# Patient Record
Sex: Female | Born: 1955 | Race: Black or African American | Hispanic: No | Marital: Single | Smoking: Former smoker
Health system: Southern US, Community
[De-identification: ages and names within clinical notes are randomized; demographics above are authoritative.]

## PROBLEM LIST (undated history)

## (undated) DIAGNOSIS — I10 Essential (primary) hypertension: Secondary | ICD-10-CM

## (undated) DIAGNOSIS — Z720 Tobacco use: Secondary | ICD-10-CM

## (undated) DIAGNOSIS — H409 Unspecified glaucoma: Secondary | ICD-10-CM

## (undated) DIAGNOSIS — I639 Cerebral infarction, unspecified: Secondary | ICD-10-CM

## (undated) DIAGNOSIS — R42 Dizziness and giddiness: Secondary | ICD-10-CM

## (undated) DIAGNOSIS — I671 Cerebral aneurysm, nonruptured: Secondary | ICD-10-CM

## (undated) DIAGNOSIS — R51 Headache: Secondary | ICD-10-CM

## (undated) DIAGNOSIS — R519 Headache, unspecified: Secondary | ICD-10-CM

## (undated) DIAGNOSIS — H269 Unspecified cataract: Secondary | ICD-10-CM

## (undated) DIAGNOSIS — I63239 Cerebral infarction due to unspecified occlusion or stenosis of unspecified carotid arteries: Secondary | ICD-10-CM

## (undated) DIAGNOSIS — E119 Type 2 diabetes mellitus without complications: Secondary | ICD-10-CM

## (undated) HISTORY — PX: CARDIAC DEFIBRILLATOR PLACEMENT: SHX171

## (undated) HISTORY — DX: Cerebral aneurysm, nonruptured: I67.1

## (undated) HISTORY — PX: OTHER SURGICAL HISTORY: SHX169

## (undated) HISTORY — PX: ABDOMINAL HYSTERECTOMY: SHX81

---

## 2017-02-17 ENCOUNTER — Emergency Department (HOSPITAL_COMMUNITY)
Admission: EM | Admit: 2017-02-17 | Discharge: 2017-02-18 | Disposition: A | Discharge status: Home / Self Care | Payer: Self-pay | Attending: Emergency Medicine | Admitting: Emergency Medicine

## 2017-02-17 ENCOUNTER — Encounter (HOSPITAL_COMMUNITY): Payer: Self-pay | Admitting: Emergency Medicine

## 2017-02-17 DIAGNOSIS — I10 Essential (primary) hypertension: Secondary | ICD-10-CM

## 2017-02-17 DIAGNOSIS — R51 Headache: Secondary | ICD-10-CM | POA: Insufficient documentation

## 2017-02-17 DIAGNOSIS — G44209 Tension-type headache, unspecified, not intractable: Secondary | ICD-10-CM

## 2017-02-17 DIAGNOSIS — K0889 Other specified disorders of teeth and supporting structures: Secondary | ICD-10-CM

## 2017-02-17 DIAGNOSIS — F17200 Nicotine dependence, unspecified, uncomplicated: Secondary | ICD-10-CM | POA: Insufficient documentation

## 2017-02-17 HISTORY — DX: Essential (primary) hypertension: I10

## 2017-02-17 HISTORY — DX: Dizziness and giddiness: R42

## 2017-02-17 MED ORDER — OXYCODONE-ACETAMINOPHEN 5-325 MG PO TABS
2.0000 | ORAL_TABLET | Freq: Once | ORAL | Status: AC
Start: 2017-02-17 — End: 2017-02-17
  Administered 2017-02-17: 2 via ORAL
  Filled 2017-02-17: qty 2

## 2017-02-17 MED ORDER — ONDANSETRON 4 MG PO TBDP
4.0000 mg | ORAL_TABLET | Freq: Once | ORAL | Status: DC
  Filled 2017-02-17: qty 1

## 2017-02-17 NOTE — ED Provider Notes (Signed)
[facility] DEPT Provider Note   CSN: [id number] Arrival date & time: 02/17/17  1631  By signing my name below, I, Dyke Brackett, attest that this documentation has been prepared under the direction and in the presence of non-physician practitioner, Margarita Mail, PA-C. Electronically Signed: Dyke Brackett, Scribe. 02/17/2017. 10:36 PM.   History   Chief Complaint Chief Complaint  Patient presents with  . Headache  . Dizziness   HPI Amy Howell is a 61 y.o. female with a hx of HTN and vertigo who presents to the Emergency Department complaining of gradual onset, constant right lateral occipital and right lateral occular headache onset today. She describes this as aching, stabbing, throbbing pain. She notes associated upper right and upper left dental pain. Pt states "Every time I get a headache, my teeth hurt". Per pt, this dental pain is exacerbated by eating or drinking. She previously had surgery on her gums to these areas due to an infection. She also reports intermittent vertigo. Pt has taken Imitrex with no relief of symptoms. Of notes, pt recently moved to [place] form [place] and has been rationing her blood pressure medication because she has not established care with a PCP. She denies any sinus pain, sinus pressure, cough, congestion, rhinorrhea, or any other associated symptoms.   The history is provided by the patient. No language interpreter was used.    Past Medical History:  Diagnosis Date  . Hypertension   . Vertigo     There are no active problems to display for this patient.   Past Surgical History:  Procedure Laterality Date  . gum operation      OB History    No data available       Home Medications    Prior to Admission medications   Medication Sig Start Date End Date Taking? Authorizing Provider  amLODipine (NORVASC) 10 MG tablet Take 10 mg by mouth daily.   Yes Historical Provider, MD  Cyanocobalamin (VITAMIN B 12 PO) Take 1 tablet by mouth  daily.   Yes Historical Provider, MD    Family History History reviewed. No pertinent family history.  Social History Social History  Substance Use Topics  . Smoking status: Current Every Day Smoker  . Smokeless tobacco: Never Used  . Alcohol use No     Allergies   Patient has no known allergies.   Review of Systems Review of Systems 10 systems reviewed and all are negative for acute change except as noted in the HPI.   Physical Exam Updated Vital Signs BP (!) 219/96 (BP Location: Left Arm)   Pulse 77   Temp 98.6 F (37 C) (Oral)   Resp 18   SpO2 100%   Physical Exam  Constitutional: She is oriented to person, place, and time. She appears well-developed and well-nourished. No distress.  HENT:  Head: Normocephalic and atraumatic.  Mouth/Throat: Oropharynx is clear and moist.  Eyes: Conjunctivae and EOM are normal. Pupils are equal, round, and reactive to light. No scleral icterus.  No horizontal, vertical or rotational nystagmus  Neck: Normal range of motion. Neck supple.  Full active and passive ROM without pain No midline or paraspinal tenderness No nuchal rigidity or meningeal signs  Cardiovascular: Normal rate, regular rhythm, normal heart sounds and intact distal pulses.  Exam reveals no gallop and no friction rub.   No murmur heard. Pulmonary/Chest: Effort normal and breath sounds normal. No respiratory distress. She has no wheezes. She has no rales.  Abdominal: Soft. Bowel sounds are normal.  She exhibits no distension. There is no tenderness. There is no rebound and no guarding.  Musculoskeletal: Normal range of motion.  Lymphadenopathy:    She has no cervical adenopathy.  Neurological: She is alert and oriented to person, place, and time. No cranial nerve deficit. She exhibits normal muscle tone. Coordination normal.  Mental Status:  Alert, oriented, thought content appropriate. Speech fluent without evidence of aphasia. Able to follow 2 step commands  without difficulty.  Cranial Nerves:  II:  Peripheral visual fields grossly normal, pupils equal, round, reactive to light III,IV, VI: ptosis not present, extra-ocular motions intact bilaterally  V,VII: smile symmetric, facial light touch sensation equal VIII: hearing grossly normal bilaterally  IX,X: midline uvula rise  XI: bilateral shoulder shrug equal and strong XII: midline tongue extension  Motor:  5/5 in upper and lower extremities bilaterally including strong and equal grip strength and dorsiflexion/plantar flexion Sensory: Pinprick and light touch normal in all extremities.  Cerebellar: normal finger-to-nose with bilateral upper extremities Gait: normal gait and balance CV: distal pulses palpable throughout   Skin: Skin is warm and dry. No rash noted. She is not diaphoretic.  Psychiatric: She has a normal mood and affect. Her behavior is normal. Judgment and thought content normal.  Nursing note and vitals reviewed.   ED Treatments / Results  DIAGNOSTIC STUDIES:  Oxygen Saturation is 100% on RA, normal by my interpretation.    COORDINATION OF CARE:  10:25 PM Discussed treatment plan with pt at bedside and pt agreed to plan.  Labs (all labs ordered are listed, but only abnormal results are displayed) Labs Reviewed - No data to display  EKG  EKG Interpretation None       Radiology No results found.  Procedures Procedures (including critical care time)  Medications Ordered in ED Medications - No data to display   Initial Impression / Assessment and Plan / ED Course  I have reviewed the triage vital signs and the nursing notes.  Pertinent labs & imaging results that were available during my care of the patient were reviewed by me and considered in my medical decision making (see chart for details).     Pt HA treated and improved while in ED.  Presentation is like pts typical HA and non concerning for [facility], ICH, Meningitis, or temporal arteritis. Pt is  afebrile with no focal neuro deficits, nuchal rigidity, or change in vision. Pt is to follow up with PCP to discuss prophylactic medication. Pt verbalizes understanding and is agreeable with plan to dc.    Final Clinical Impressions(s) / ED Diagnoses   Final diagnoses:  Acute non intractable tension-type headache  Pain, dental  Hypertension, unspecified type    New Prescriptions New Prescriptions   No medications on file  I personally performed the services described in this documentation, which was scribed in my presence. The recorded information has been reviewed and is accurate.      Margarita Mail, PA-C 02/18/17 0071    Lacretia Leigh, MD 02/18/17 [id number]

## 2017-02-17 NOTE — ED Triage Notes (Addendum)
Pt reports HA, ear pain, dental pain, nausea, and vertigo (room spinning sensation) since February. Pt has a hx of vertigo. Pt also rationing BP medication for the past month because she is between PCPs.

## 2017-02-17 NOTE — ED Notes (Signed)
Pt ambulatory to restroom

## 2017-02-18 MED ORDER — OXYCODONE-ACETAMINOPHEN 5-325 MG PO TABS: 1.0000 | ORAL_TABLET | Freq: Four times a day (QID) | ORAL | 0 refills | Status: DC | PRN

## 2017-02-18 MED ORDER — AMLODIPINE BESYLATE 10 MG PO TABS: 10.0000 mg | ORAL_TABLET | Freq: Every day | ORAL | 1 refills | Status: DC

## 2017-02-18 MED ORDER — MECLIZINE HCL 25 MG PO TABS: 12.5000 mg | ORAL_TABLET | Freq: Three times a day (TID) | ORAL | 0 refills | Status: DC | PRN

## 2017-02-18 NOTE — Discharge Instructions (Signed)
You are having a headache. No specific cause was found today for your headache. It may have been a migraine or other cause of headache. Stress, anxiety, fatigue, and depression are common triggers for headaches. Your headache today does not appear to be life-threatening or require hospitalization, but often the exact cause of headaches is not determined in the emergency department. Therefore, follow-up with your doctor is very important to find out what may have caused your headache, and whether or not you need any further diagnostic testing or treatment. Sometimes headaches can appear benign (not harmful), but then more serious symptoms can develop which should prompt an immediate re-evaluation by your doctor or the emergency department. SEEK MEDICAL ATTENTION IF: You develop possible problems with medications prescribed.  The medications don't resolve your headache, if it recurs , or if you have multiple episodes of vomiting or can't take fluids. You have a change from the usual headache. RETURN IMMEDIATELY IF you develop a sudden, severe headache or confusion, become poorly responsive or faint, develop a fever above 100.32F or problem breathing, have a change in speech, vision, swallowing, or understanding, or develop new weakness, numbness, tingling, incoordination, or have a seizure.  [facility]  [address]  [place], [place] [address]  Phone [phone]  [facility] [place] [facility] [place], [place], exemplifies the [employer]?s vision to improve the health and quality of life of all [place] by Regulatory affairs officer with a passion to care for the underserved and by leading the nation in community-based, service learning oral health education. We are committed to offering comprehensive general dental services for adults,  children and special needs patients in a safe, caring and professional setting.  Appointments: Our clinic is open Monday through Friday 8:00 a.m. until 5:00 p.m. The amount of time scheduled for an appointment depends on the patient?s specific needs. We ask that you keep your appointed time for care or provide 24-hour notice of all appointment changes. Parents or legal guardians must accompany minor children.  Payment for Services: Medicaid and other insurance plans are welcome. Payment for services is due when services are rendered and may be made by cash or credit card. If you have dental insurance, we will assist you with your claim submission.   Emergencies: Emergency services will be provided Monday through Friday on a walk-in basis. Please arrive early for emergency services. After hours emergency services will be provided for patients of record as required.  Services:  Comprehensive General Dentistry  Children?s Dentistry  Oral Surgery - Extractions  Root Canals  Sealants and Tooth Colored Fillings  Crowns and Administrator  3-D/[facility]

## 2017-03-18 ENCOUNTER — Ambulatory Visit: Payer: Self-pay | Attending: Internal Medicine | Admitting: Physician Assistant

## 2017-03-18 VITALS — BP 134/84 | HR 84 | Temp 98.8°F | Resp 16 | Ht 60.0 in | Wt 98.0 lb

## 2017-03-18 DIAGNOSIS — I10 Essential (primary) hypertension: Secondary | ICD-10-CM

## 2017-03-18 DIAGNOSIS — H539 Unspecified visual disturbance: Secondary | ICD-10-CM

## 2017-03-18 DIAGNOSIS — M79602 Pain in left arm: Secondary | ICD-10-CM

## 2017-03-18 MED ORDER — AMLODIPINE BESYLATE 10 MG PO TABS: 10.0000 mg | ORAL_TABLET | Freq: Every day | ORAL | 1 refills | Status: DC

## 2017-03-18 MED ORDER — MELOXICAM 7.5 MG PO TABS: ORAL_TABLET | ORAL | 0 refills | Status: DC

## 2017-03-18 NOTE — Progress Notes (Signed)
Patient ID: Amy Howell, female   DOB: May 05, 1956, 61 y.o.   MRN: [id number]       Amy Howell, is a 61 y.o. female  PFX:[id number]  HGD:[id number]  DOB - 29-Apr-1956  Subjective:  Chief Complaint and HPI: Amy Howell is a 61 y.o. female here today to establish care and for a follow up visit After being seen in the ED for headache, tooth pain, and mild vertigo 02/17/2017.  She has a h/o htn and had been rationing her BP meds since moving here from [place].  No imaging or labs were done.  Zofran and Roxicet releived the headache in the ED and she was discharged with Meclizine, oxycodone, and amlodipine.   Today she presents only taking 1/2 tablets of the amlodipine because she feels it is causing her to have headaches.  The headaches are on the L side of her head and are the same as what she was having when she presented to the ED.  She c/o similar headaches since childhood that also caused tooth pain.    Her daughter moved her here from [place] in February 2018 after she retired in December.  She was in the process of applying for disability there.  Today she is concerned because of an increase in blurry vision over the last several months.  She thinks her cataracts may have progressed.  She is not driving.  She wants to move back to [place].  She also c/o L arm pain that is intermittent and worse with reaching and lifting objects.  No paresthesias, weakness, or CP.  ED/Hospital notes reviewed.    Social:  Living in [place] with daughter; recently relocated from [place].  Doesn't want to live here.  FH:  Dad w/ htn, DM, blind in one eye and cataracts  ROS:   Constitutional:  No f/c, No night sweats, No unexplained weight loss. EENT:  Vision changes as above, No hearing changes. No mouth, throat, or ear problems.  Respiratory: No cough, No SOB Cardiac: No CP, no palpitations GI:  No abd pain, No N/V/D. GU: No Urinary s/sx Musculoskeletal: L upper mid arm pain Neuro: + headaches as above, no dizziness, no  motor weakness.  Skin: No rash Endocrine:  No polydipsia. No polyuria.  Psych: Denies SI/HI  No problems updated.  ALLERGIES: No Known Allergies  PAST MEDICAL HISTORY: Past Medical History:  Diagnosis Date  . Hypertension   . Vertigo     MEDICATIONS AT HOME: Prior to Admission medications   Medication Sig Start Date End Date Taking? Authorizing Provider  amLODipine (NORVASC) 10 MG tablet Take 1 tablet (10 mg total) by mouth daily. 03/18/17  Yes Argentina Donovan, PA-C  oxyCODONE-acetaminophen (PERCOCET) 5-325 MG tablet Take 1 tablet by mouth every 6 (six) hours as needed for severe pain. 02/18/17  Yes Margarita Mail, PA-C  trimethoprim-polymyxin b (POLYTRIM) ophthalmic solution every 4 (four) hours.   Yes Historical Provider, MD  Cyanocobalamin (VITAMIN B 12 PO) Take 1 tablet by mouth daily.    Historical Provider, MD  meclizine (ANTIVERT) 25 MG tablet Take 0.5-1 tablets (12.5-25 mg total) by mouth 3 (three) times daily as needed for dizziness or nausea. Patient not taking: Reported on 03/18/2017 02/18/17   Margarita Mail, PA-C  meloxicam (MOBIC) 7.5 MG tablet 1 bid prn pain 03/18/17   Argentina Donovan, PA-C     Objective:  EXAM:   Vitals:   03/18/17 0933  BP: 134/84  Pulse: 84  Resp: 16  Temp: 98.8 F (37.1  C)  TempSrc: Oral  SpO2: 100%  Weight: 98 lb (44.5 kg)  Height: 5' (1.524 m)    General appearance : A&OX3. NAD. Non-toxic-appearing HEENT: Atraumatic and Normocephalic.  PERRLA. EOM intact.  TM clear B. Mouth-MMM, post pharynx WNL w/o erythema, No PND. Neck: supple, no JVD. No cervical lymphadenopathy. No thyromegaly Chest/Lungs:  Breathing-non-labored, Good air entry bilaterally, breath sounds normal without rales, rhonchi, or wheezing  CVS: S1 S2 regular, no murmurs, gallops, rubs  Extremities: Bilateral Lower Ext shows no edema, both legs are warm to touch with = pulse throughout.  R and L UE examined without any abnormality or limitation. Neurology:  CN II-XII  grossly intact, Non focal.  Normal facial and extremity symmetry Psych:  TP circumferential. Tangential speech. Appropriate eye contact and affect.  Skin:  No Rash  Data Review No results found for: HGBA1C   Assessment & Plan   1. Hypertension, unspecified type Improving control.  She thinks she only needs 5mg  amlodipine and that 10mg  is causing her headaches.  I am ok with her trying only 5mg  as long as she checks her BP outside of the office and records it and she resumes 10mg  if she is consistently getting readings higher than 140/90. - Comprehensive metabolic panel - TSH - CBC with Differential/Platelet - amLODipine (NORVASC) 10 MG tablet; Take 1 tablet (10 mg total) by mouth daily.  Dispense: 30 tablet; Refill: 1 Check BP OOO 3-4 times/week and record and bring to next visit. We have discussed target BP range and blood pressure goal. I have advised patient to check BP regularly and to call us back or report to clinic if the numbers are consistently higher than 140/90. We discussed the importance of compliance with medical therapy and DASH diet recommended, consequences of uncontrolled hypertension discussed.     2. Vision changes - Ambulatory referral to Ophthalmology  3. Left arm pain Believe musculoskeletal. - meloxicam (MOBIC) 7.5 MG tablet; 1 bid prn pain  Dispense: 60 tablet; Refill: 0  Having patient sign ROI.  She is somewhat difficult to obtain direct history from so old records are imperative to have these to provide good care for her.     Patient have been counseled extensively about nutrition and exercise  Return in about 3 weeks (around 04/08/2017) for assign PCP; f/up htn, arm pain and vision changes.  The patient was given clear instructions to go to ER or return to medical center if symptoms don't improve, worsen or new problems develop. The patient verbalized understanding. The patient was told to call to get lab results if they haven't heard anything in the next  week.     Freeman Caldron, PA-C [facility] [place], [place]   03/18/2017, 10:04 AM

## 2017-03-18 NOTE — Patient Instructions (Signed)
Check blood pressure 3-4 times/week and record and bring to next visit.  Hypertension Hypertension, commonly called high blood pressure, is when the force of blood pumping through the arteries is too strong. The arteries are the blood vessels that carry blood from the heart throughout the body. Hypertension forces the heart to work harder to pump blood and may cause arteries to become narrow or stiff. Having untreated or uncontrolled hypertension can cause heart attacks, strokes, kidney disease, and other problems. A blood pressure reading consists of a higher number over a lower number. Ideally, your blood pressure should be below 120/80. The first ("top") number is called the systolic pressure. It is a measure of the pressure in your arteries as your heart beats. The second ("bottom") number is called the diastolic pressure. It is a measure of the pressure in your arteries as the heart relaxes. What are the causes? The cause of this condition is not known. What increases the risk? Some risk factors for high blood pressure are under your control. Others are not. Factors you can change   Smoking.  Having type 2 diabetes mellitus, high cholesterol, or both.  Not getting enough exercise or physical activity.  Being overweight.  Having too much fat, sugar, calories, or salt (sodium) in your diet.  Drinking too much alcohol. Factors that are difficult or impossible to change   Having chronic kidney disease.  Having a family history of high blood pressure.  Age. Risk increases with age.  Race. You may be at higher risk if you are African-American.  Gender. Men are at higher risk than women before age 25. After age 71, women are at higher risk than men.  Having obstructive sleep apnea.  Stress. What are the signs or symptoms? Extremely high blood pressure (hypertensive crisis) may cause:  Headache.  Anxiety.  Shortness of breath.  Nosebleed.  Nausea and vomiting.  Severe  chest pain.  Jerky movements you cannot control (seizures). How is this diagnosed? This condition is diagnosed by measuring your blood pressure while you are seated, with your arm resting on a surface. The cuff of the blood pressure monitor will be placed directly against the skin of your upper arm at the level of your heart. It should be measured at least twice using the same arm. Certain conditions can cause a difference in blood pressure between your right and left arms. Certain factors can cause blood pressure readings to be lower or higher than normal (elevated) for a short period of time:  When your blood pressure is higher when you are in a health care provider's office than when you are at home, this is called white coat hypertension. Most people with this condition do not need medicines.  When your blood pressure is higher at home than when you are in a health care provider's office, this is called masked hypertension. Most people with this condition may need medicines to control blood pressure. If you have a high blood pressure reading during one visit or you have normal blood pressure with other risk factors:  You may be asked to return on a different day to have your blood pressure checked again.  You may be asked to monitor your blood pressure at home for 1 week or longer. If you are diagnosed with hypertension, you may have other blood or imaging tests to help your health care provider understand your overall risk for other conditions. How is this treated? This condition is treated by making healthy lifestyle changes, such  as eating healthy foods, exercising more, and reducing your alcohol intake. Your health care provider may prescribe medicine if lifestyle changes are not enough to get your blood pressure under control, and if:  Your systolic blood pressure is above 130.  Your diastolic blood pressure is above 80. Your personal target blood pressure may vary depending on your  medical conditions, your age, and other factors. Follow these instructions at home: Eating and drinking   Eat a diet that is high in fiber and potassium, and low in sodium, added sugar, and fat. An example eating plan is called the DASH (Dietary Approaches to Stop Hypertension) diet. To eat this way:  Eat plenty of fresh fruits and vegetables. Try to fill half of your plate at each meal with fruits and vegetables.  Eat whole grains, such as whole wheat pasta, brown rice, or whole grain bread. Fill about one quarter of your plate with whole grains.  Eat or drink low-fat dairy products, such as skim milk or low-fat yogurt.  Avoid fatty cuts of meat, processed or cured meats, and poultry with skin. Fill about one quarter of your plate with lean proteins, such as fish, chicken without skin, beans, eggs, and tofu.  Avoid premade and processed foods. These tend to be higher in sodium, added sugar, and fat.  Reduce your daily sodium intake. Most people with hypertension should eat less than 1,500 mg of sodium a day.  Limit alcohol intake to no more than 1 drink a day for nonpregnant women and 2 drinks a day for men. One drink equals 12 oz of beer, 5 oz of wine, or 1 oz of hard liquor. Lifestyle   Work with your health care provider to maintain a healthy body weight or to lose weight. Ask what an ideal weight is for you.  Get at least 30 minutes of exercise that causes your heart to beat faster (aerobic exercise) most days of the week. Activities may include walking, swimming, or biking.  Include exercise to strengthen your muscles (resistance exercise), such as pilates or lifting weights, as part of your weekly exercise routine. Try to do these types of exercises for 30 minutes at least 3 days a week.  Do not use any products that contain nicotine or tobacco, such as cigarettes and e-cigarettes. If you need help quitting, ask your health care provider.  Monitor your blood pressure at home as  told by your health care provider.  Keep all follow-up visits as told by your health care provider. This is important. Medicines   Take over-the-counter and prescription medicines only as told by your health care provider. Follow directions carefully. Blood pressure medicines must be taken as prescribed.  Do not skip doses of blood pressure medicine. Doing this puts you at risk for problems and can make the medicine less effective.  Ask your health care provider about side effects or reactions to medicines that you should watch for. Contact a health care provider if:  You think you are having a reaction to a medicine you are taking.  You have headaches that keep coming back (recurring).  You feel dizzy.  You have swelling in your ankles.  You have trouble with your vision. Get help right away if:  You develop a severe headache or confusion.  You have unusual weakness or numbness.  You feel faint.  You have severe pain in your chest or abdomen.  You vomit repeatedly.  You have trouble breathing. Summary  Hypertension is when the force  of blood pumping through your arteries is too strong. If this condition is not controlled, it may put you at risk for serious complications.  Your personal target blood pressure may vary depending on your medical conditions, your age, and other factors. For most people, a normal blood pressure is less than 120/80.  Hypertension is treated with lifestyle changes, medicines, or a combination of both. Lifestyle changes include weight loss, eating a healthy, low-sodium diet, exercising more, and limiting alcohol. This information is not intended to replace advice given to you by your health care provider. Make sure you discuss any questions you have with your health care provider. Document Released: 11/18/2005 Document Revised: 10/16/2016 Document Reviewed: 10/16/2016 Elsevier Interactive Patient Education  2017 [employer].

## 2017-03-19 LAB — COMPREHENSIVE METABOLIC PANEL
A/G RATIO: 1.3 (ref 1.2–2.2)
ALK PHOS: 96 IU/L (ref 39–117)
ALT: 12 IU/L (ref 0–32)
AST: 15 IU/L (ref 0–40)
Albumin: 4.8 g/dL (ref 3.6–4.8)
BILIRUBIN TOTAL: 0.3 mg/dL (ref 0.0–1.2)
BUN / CREAT RATIO: 18 (ref 12–28)
BUN: 16 mg/dL (ref 8–27)
CHLORIDE: 99 mmol/L (ref 96–106)
CO2: 26 mmol/L (ref 18–29)
Calcium: 10 mg/dL (ref 8.7–10.3)
Creatinine, Ser: 0.87 mg/dL (ref 0.57–1.00)
GFR calc non Af Amer: 71 mL/min/{1.73_m2} (ref >59)
GFR, EST AFRICAN AMERICAN: 82 mL/min/{1.73_m2} (ref >59)
Globulin, Total: 3.6 g/dL (ref 1.5–4.5)
Glucose: 87 mg/dL (ref 65–99)
Potassium: 4.4 mmol/L (ref 3.5–5.2)
Sodium: 141 mmol/L (ref 134–144)
TOTAL PROTEIN: 8.4 g/dL (ref 6.0–8.5)

## 2017-03-19 LAB — CBC WITH DIFFERENTIAL/PLATELET
BASOS: 1 %
Basophils Absolute: 0 10*3/uL (ref 0.0–0.2)
EOS (ABSOLUTE): 0.2 10*3/uL (ref 0.0–0.4)
Eos: 4 %
Hematocrit: 39.2 % (ref 34.0–46.6)
Hemoglobin: 12.8 g/dL (ref 11.1–15.9)
Immature Grans (Abs): 0 10*3/uL (ref 0.0–0.1)
Immature Granulocytes: 0 %
LYMPHS ABS: 1.3 10*3/uL (ref 0.7–3.1)
Lymphs: 22 %
MCH: 29.3 pg (ref 26.6–33.0)
MCHC: 32.7 g/dL (ref 31.5–35.7)
MCV: 90 fL (ref 79–97)
Monocytes Absolute: 0.5 10*3/uL (ref 0.1–0.9)
Monocytes: 7 %
NEUTROS ABS: 4.1 10*3/uL (ref 1.4–7.0)
Neutrophils: 66 %
Platelets: 176 10*3/uL (ref 150–379)
RBC: 4.37 x10E6/uL (ref 3.77–5.28)
RDW: 13.8 % (ref 12.3–15.4)
WBC: 6.2 10*3/uL (ref 3.4–10.8)

## 2017-03-19 LAB — TSH: TSH: 0.837 u[IU]/mL (ref 0.450–4.500)

## 2017-03-21 ENCOUNTER — Telehealth: Payer: Self-pay

## 2017-03-21 NOTE — Telephone Encounter (Signed)
Contacted pt to go over lab results pt didn't answer lvm asking pt to give me a call at her earliest convieneince    If pt calls back please give results: let her know that her labs are normal. This includes her blood count, thyroid function, blood sugar(no diabetes), kidney function, liver function, and electrolytes. Follow-up as planned.

## 2017-03-25 ENCOUNTER — Telehealth: Payer: Self-pay | Admitting: General Practice

## 2017-03-25 NOTE — Telephone Encounter (Signed)
Pt. Returned nurse call and per the nurse pt. Was told that her results were normal. Pt. Had further questions and requested to speak with the nurse. Please f/u with pt.

## 2017-03-26 ENCOUNTER — Inpatient Hospital Stay (HOSPITAL_COMMUNITY)
Admission: EM | Admit: 2017-03-26 | Discharge: 2017-03-29 | DRG: 040 | Disposition: A | Discharge status: Home / Self Care | Payer: Self-pay | Attending: Nephrology | Admitting: Nephrology

## 2017-03-26 ENCOUNTER — Encounter (HOSPITAL_COMMUNITY): Payer: Self-pay

## 2017-03-26 ENCOUNTER — Inpatient Hospital Stay (HOSPITAL_COMMUNITY): Payer: Self-pay

## 2017-03-26 ENCOUNTER — Emergency Department (HOSPITAL_COMMUNITY): Payer: Self-pay

## 2017-03-26 DIAGNOSIS — I639 Cerebral infarction, unspecified: Secondary | ICD-10-CM | POA: Diagnosis present

## 2017-03-26 DIAGNOSIS — I63439 Cerebral infarction due to embolism of unspecified posterior cerebral artery: Principal | ICD-10-CM | POA: Diagnosis present

## 2017-03-26 DIAGNOSIS — Z681 Body mass index (BMI) 19 or less, adult: Secondary | ICD-10-CM

## 2017-03-26 DIAGNOSIS — H5347 Heteronymous bilateral field defects: Secondary | ICD-10-CM | POA: Diagnosis present

## 2017-03-26 DIAGNOSIS — Z8673 Personal history of transient ischemic attack (TIA), and cerebral infarction without residual deficits: Secondary | ICD-10-CM

## 2017-03-26 DIAGNOSIS — R51 Headache: Secondary | ICD-10-CM | POA: Diagnosis present

## 2017-03-26 DIAGNOSIS — E785 Hyperlipidemia, unspecified: Secondary | ICD-10-CM | POA: Diagnosis present

## 2017-03-26 DIAGNOSIS — E43 Unspecified severe protein-calorie malnutrition: Secondary | ICD-10-CM | POA: Diagnosis present

## 2017-03-26 DIAGNOSIS — R634 Abnormal weight loss: Secondary | ICD-10-CM

## 2017-03-26 DIAGNOSIS — Z90710 Acquired absence of both cervix and uterus: Secondary | ICD-10-CM

## 2017-03-26 DIAGNOSIS — F17200 Nicotine dependence, unspecified, uncomplicated: Secondary | ICD-10-CM

## 2017-03-26 DIAGNOSIS — Z79899 Other long term (current) drug therapy: Secondary | ICD-10-CM

## 2017-03-26 DIAGNOSIS — I671 Cerebral aneurysm, nonruptured: Secondary | ICD-10-CM

## 2017-03-26 DIAGNOSIS — I10 Essential (primary) hypertension: Secondary | ICD-10-CM | POA: Diagnosis present

## 2017-03-26 DIAGNOSIS — I651 Occlusion and stenosis of basilar artery: Secondary | ICD-10-CM

## 2017-03-26 LAB — CBC WITH DIFFERENTIAL/PLATELET
BASOS ABS: 0 10*3/uL (ref 0.0–0.1)
Basophils Relative: 0 %
EOS PCT: 5 %
Eosinophils Absolute: 0.3 10*3/uL (ref 0.0–0.7)
HCT: 39 % (ref 36.0–46.0)
Hemoglobin: 12.6 g/dL (ref 12.0–15.0)
Lymphocytes Relative: 27 %
Lymphs Abs: 1.7 10*3/uL (ref 0.7–4.0)
MCH: 30.3 pg (ref 26.0–34.0)
MCHC: 32.3 g/dL (ref 30.0–36.0)
MCV: 93.8 fL (ref 78.0–100.0)
MONO ABS: 0.3 10*3/uL (ref 0.1–1.0)
Monocytes Relative: 5 %
Neutro Abs: 4.2 10*3/uL (ref 1.7–7.7)
Neutrophils Relative %: 63 %
PLATELETS: 161 10*3/uL (ref 150–400)
RBC: 4.16 MIL/uL (ref 3.87–5.11)
RDW: 13.1 % (ref 11.5–15.5)
WBC: 6.6 10*3/uL (ref 4.0–10.5)

## 2017-03-26 LAB — APTT: APTT: 29 s (ref 24–36)

## 2017-03-26 LAB — COMPREHENSIVE METABOLIC PANEL
ALT: 10 U/L — ABNORMAL LOW (ref 14–54)
ANION GAP: 8 (ref 5–15)
AST: 20 U/L (ref 15–41)
Albumin: 4.1 g/dL (ref 3.5–5.0)
Alkaline Phosphatase: 81 U/L (ref 38–126)
BUN: 15 mg/dL (ref 6–20)
CHLORIDE: 101 mmol/L (ref 101–111)
CO2: 29 mmol/L (ref 22–32)
Calcium: 9.9 mg/dL (ref 8.9–10.3)
Creatinine, Ser: 0.91 mg/dL (ref 0.44–1.00)
Glucose, Bld: 176 mg/dL — ABNORMAL HIGH (ref 65–99)
POTASSIUM: 3.6 mmol/L (ref 3.5–5.1)
Sodium: 138 mmol/L (ref 135–145)
Total Bilirubin: 0.4 mg/dL (ref 0.3–1.2)
Total Protein: 7.8 g/dL (ref 6.5–8.1)

## 2017-03-26 LAB — PROTIME-INR
INR: 1.05
PROTHROMBIN TIME: 13.7 s (ref 11.4–15.2)

## 2017-03-26 MED ORDER — ASPIRIN 300 MG RE SUPP
300.0000 mg | Freq: Every day | RECTAL | Status: DC
  Administered 2017-03-28: 300 mg via RECTAL
  Filled 2017-03-26: qty 1

## 2017-03-26 MED ORDER — STROKE: EARLY STAGES OF RECOVERY BOOK
Freq: Once | Status: AC
  Administered 2017-03-27

## 2017-03-26 MED ORDER — ACETAMINOPHEN 160 MG/5ML PO SOLN: 650.0000 mg | ORAL | Status: DC | PRN

## 2017-03-26 MED ORDER — ACETAMINOPHEN 650 MG RE SUPP: 650.0000 mg | RECTAL | Status: DC | PRN

## 2017-03-26 MED ORDER — ASPIRIN 325 MG PO TABS
325.0000 mg | ORAL_TABLET | Freq: Every day | ORAL | Status: DC
Start: 2017-03-27 — End: 2017-03-29
  Administered 2017-03-27 – 2017-03-29 (×2): 325 mg via ORAL
  Filled 2017-03-26 (×2): qty 1

## 2017-03-26 MED ORDER — ACETAMINOPHEN 325 MG PO TABS
650.0000 mg | ORAL_TABLET | ORAL | Status: DC | PRN
  Administered 2017-03-27 – 2017-03-29 (×3): 650 mg via ORAL
  Filled 2017-03-26 (×3): qty 2

## 2017-03-26 MED ORDER — ENOXAPARIN SODIUM 30 MG/0.3ML ~~LOC~~ SOLN
30.0000 mg | SUBCUTANEOUS | Status: DC
Start: 2017-03-27 — End: 2017-03-29
  Administered 2017-03-27 – 2017-03-28 (×2): 30 mg via SUBCUTANEOUS
  Filled 2017-03-26 (×2): qty 0.3

## 2017-03-26 MED ORDER — AMLODIPINE BESYLATE 5 MG PO TABS: 5.0000 mg | ORAL_TABLET | Freq: Every day | ORAL | Status: DC

## 2017-03-26 MED ORDER — IOPAMIDOL (ISOVUE-370) INJECTION 76%
INTRAVENOUS | Status: AC
  Administered 2017-03-26: 50 mL
  Filled 2017-03-26: qty 50

## 2017-03-26 NOTE — ED Triage Notes (Signed)
PT sent from DR. Groat's office after eye exam to receive head ct. HE was concerned for possible head bleed d/t elevated pressure in left eye. Pt has been having severe eye pain in right eye and face and occasionally left eye since feb. She reports "episodes" of what she though was vertigo since feb. As well.

## 2017-03-26 NOTE — Consult Note (Addendum)
Referring Physician: Dr. Loleta Books    Chief Complaint: Left monocular vision loss with eye pain.   HPI: Amy Howell is an 61 y.o. female who was sent to [facility] ED from her ophthalmologist's office after exam there revealed a left monocular altitudinal visual field defect, which is evident on the visual field maps brought in from the ophthalmologist's office by the patient. Her ophthalmologist was concerned for possible intracerebral hemorrhage as well, given elevated pressure in her left eye. She states that her symptoms of left eye vision loss have been intermittent since February. She also has had vertigo, severe right eye pain, right face pain and left eye pain since February.    Past Medical History:  Diagnosis Date  . Hypertension   . Vertigo     Past Surgical History:  Procedure Laterality Date  . ABDOMINAL HYSTERECTOMY    . gum operation      Family History  Problem Relation Age of Onset  . Hypertension Father   . Diabetes Father    Social History:  reports that she has been smoking.  She has never used smokeless tobacco. She reports that she does not drink alcohol. Her drug history is not on file.  Allergies: No Known Allergies  Medications:  Prior to Admission:  Prescriptions Prior to Admission  Medication Sig Dispense Refill Last Dose  . amLODipine (NORVASC) 10 MG tablet Take 1 tablet (10 mg total) by mouth daily. 30 tablet 1 03/26/2017 at Unknown time  . meclizine (ANTIVERT) 25 MG tablet Take 0.5-1 tablets (12.5-25 mg total) by mouth 3 (three) times daily as needed for dizziness or nausea. 30 tablet 0 unk  . meloxicam (MOBIC) 7.5 MG tablet 1 bid prn pain 60 tablet 0 Past Week at Unknown time  . oxyCODONE-acetaminophen (PERCOCET) 5-325 MG tablet Take 1 tablet by mouth every 6 (six) hours as needed for severe pain. 10 tablet 0 Past Month at Unknown time   Scheduled: . amLODipine  5 mg Oral Daily  . aspirin  300 mg Rectal Daily   Or  . aspirin  325 mg Oral Daily  .  enoxaparin (LOVENOX) injection  30 mg Subcutaneous Q24H    ROS: Positive for bifrontal headache and posterior neck pain that is nonthrobbing and 8/10. Positive for LUE pain. Negative for CP, SOB, abdominal pain or leg pain. Denies weakness except for LUE in association with the pain. Denies confusion, ataxia or aphasia.   Physical Examination: Blood pressure (!) 201/85, pulse 69, temperature 98.3 F (36.8 C), temperature source Oral, resp. rate 16, SpO2 100 %.   HEENT: [place]/AT Lungs: Respirations unlabored Ext: No edema.   Neurologic Examination: Mental Status: Alert, oriented, thought content appropriate.  Dysthymic affect. Speech fluent without evidence of aphasia.  Able to follow all commands without difficulty. Cranial Nerves: II:  Peripheral visual fields are intact. Visual acuity is reduced bilaterally left worse than right; able to count fingers and attend to examiner visually, but unable to read a badge with either eye. PERRL. Funduscopic exam reveals no papilledema in either eye. III,IV, VI: ptosis not present, EOMI without nystagmus V,VII: smile symmetric, facial temp sensation normal bilaterally VIII: hearing intact to voice IX,X: no hypophonia or hoarseness XI: bilateral shoulder shrug is symmetric XII: midline tongue extension  Motor: Right : Upper extremity   55  Left:     Upper extremity 4+/5 in context of pain   Lower extremity   5/5   Lower extremity   5/5 Normal tone throughout; no atrophy noted  Sensory: Temp and light touch intact throughout, bilaterally Deep Tendon Reflexes:  2+ bilateral upper and lower extremities.  Toes downgoing bilaterally.  Cerebellar: No ataxia with FNF bilaterally.  Gait: Normal gait and station.  Results for orders placed or performed during the hospital encounter of 03/26/17 (from the past 48 hour(s))  CBC with Differential     Status: None   Collection Time: 03/26/17  6:56 PM  Result Value Ref Range   WBC 6.6 4.0 - 10.5 K/uL   RBC  4.16 3.87 - 5.11 MIL/uL   Hemoglobin 12.6 12.0 - 15.0 g/dL   HCT 39.0 36.0 - 46.0 %   MCV 93.8 78.0 - 100.0 fL   MCH 30.3 26.0 - 34.0 pg   MCHC 32.3 30.0 - 36.0 g/dL   RDW 13.1 11.5 - 15.5 %   Platelets 161 150 - 400 K/uL   Neutrophils Relative % 63 %   Neutro Abs 4.2 1.7 - 7.7 K/uL   Lymphocytes Relative 27 %   Lymphs Abs 1.7 0.7 - 4.0 K/uL   Monocytes Relative 5 %   Monocytes Absolute 0.3 0.1 - 1.0 K/uL   Eosinophils Relative 5 %   Eosinophils Absolute 0.3 0.0 - 0.7 K/uL   Basophils Relative 0 %   Basophils Absolute 0.0 0.0 - 0.1 K/uL  Comprehensive metabolic panel     Status: Abnormal   Collection Time: 03/26/17  6:56 PM  Result Value Ref Range   Sodium 138 135 - 145 mmol/L   Potassium 3.6 3.5 - 5.1 mmol/L   Chloride 101 101 - 111 mmol/L   CO2 29 22 - 32 mmol/L   Glucose, Bld 176 (H) 65 - 99 mg/dL   BUN 15 6 - 20 mg/dL   Creatinine, Ser 0.91 0.44 - 1.00 mg/dL   Calcium 9.9 8.9 - 10.3 mg/dL   Total Protein 7.8 6.5 - 8.1 g/dL   Albumin 4.1 3.5 - 5.0 g/dL   AST 20 15 - 41 U/L   ALT 10 (L) 14 - 54 U/L   Alkaline Phosphatase 81 38 - 126 U/L   Total Bilirubin 0.4 0.3 - 1.2 mg/dL   GFR calc non Af Amer >60 >60 mL/min   GFR calc Af Amer >60 >60 mL/min    Comment: (NOTE) The eGFR has been calculated using the CKD EPI equation. This calculation has not been validated in all clinical situations. eGFR's persistently <60 mL/min signify possible Chronic Kidney Disease.    Anion gap 8 5 - 15  Protime-INR     Status: None   Collection Time: 03/26/17  6:56 PM  Result Value Ref Range   Prothrombin Time 13.7 11.4 - 15.2 seconds   INR 1.05   APTT     Status: None   Collection Time: 03/26/17  6:56 PM  Result Value Ref Range   aPTT 29 24 - 36 seconds   Ct Head Wo Contrast  Result Date: 03/26/2017 CLINICAL DATA:  Intermittent headaches and vertigo since February. History of hypertension. EXAM: CT HEAD WITHOUT CONTRAST TECHNIQUE: Contiguous axial images were obtained from the base  of the skull through the vertex without intravenous contrast. COMPARISON:  None. FINDINGS: BRAIN: No intraparenchymal hemorrhage, mass effect nor midline shift. The ventricles and sulci are normal for age. Mesial RIGHT occipital lobe early encephalomalacia. Old bilateral cerebellar infarcts. Patchy supratentorial white matter hypodensities within normal range for patient's age, though non-specific are most compatible with chronic small vessel ischemic disease. No acute large vascular territory infarcts. No abnormal  extra-axial fluid collections. Basal cisterns are patent. VASCULAR: Moderate calcific atherosclerosis of the carotid siphons and to lesser extent intradural vertebral artery's. SKULL: No skull fracture. No significant scalp soft tissue swelling. SINUSES/ORBITS: The mastoid air-cells and included paranasal sinuses are well-aerated.The included ocular globes and orbital contents are non-suspicious. OTHER: None. IMPRESSION: Subacute to chronic RIGHT occipital lobe/ PCA territory infarct. Old bilateral cerebellar infarcts. Recommend MRA versus CTA to evaluate for posterior circulation abnormality. Mild chronic small vessel ischemic disease. Electronically Signed   By: Elon Alas M.D.   On: 03/26/2017 19:25    Assessment: 61 y.o. female with subacute right occipital lobe ischemic infarction.  1. Initially presented with left monocular altitudinal visual field defect in the setting of a 2 month history of intermittent bilateral eye and right face pain  2. Elevated left monocular pressure per report at her ophthalmologist appointment prior to admission. ? Glaucoma ? 3. CT head also reveals old bilateral cerebellar infarcts also noted.  4. Severe HTN.  5. Stroke Risk Factors - HTN  Plan: 1. Agree with starting ASA 2. MRI of the brain with contrast.  3. CTA of head and neck.  4. Echocardiogram 5. PT consult, OT consult, Speech consult 6. Start atorvastatin 40 mg po qd. Obtain baseline CK  level.  7. BP management. Based upon time course of her symptoms and appearance of the stroke on CT, she is most likely out of the permissive HTN time window 8. Telemetry monitoring 9. Frequent neuro checks 10. HgbA1c, fasting lipid panel 11. Will need recommendations from ophthalmologist regarding elevated left monocular pressure (per report) given that no mass or hemorrhage was seen on CT head. ? Glaucoma ?   Electronically signed: Dr. Kerney Elbe 03/26/2017, 10:14 PM

## 2017-03-26 NOTE — ED Provider Notes (Signed)
[facility] DEPT Provider Note   CSN: [id number] Arrival date & time: 03/26/17  1807     History   Chief Complaint Chief Complaint  Patient presents with  . Headache    HPI Amy Howell is a 61 y.o. female with history of hypertension and vertigo who presents from ophthalmologist's office, Dr. Katy Fitch, for further evaluation of eye and face pain that has been present since February. Patient had elevated pressure in her left eye at the ophthalmologist office today. Dr. Katy Fitch was concerned about possible head bleed and was sent here for head CT. Patient has had symptoms of eye pain and face pain associated with decreasing vision, episodes of vertigo, and transient episodes of not being able to move. Patient reports trying to get up from bed to go to the bathroom and not being able to, however this did resolve. Patient also reports left arm pain that has been present since February as well. It is worse with certain movements. She denies any numbness or tingling. Patient feels that her voice is softer than normal, which her daughter confirms. Patient denies any fevers, chest pain, shortness of breath, abdominal pain, nausea, vomiting, urinary symptoms.  HPI  Past Medical History:  Diagnosis Date  . Hypertension   . Vertigo     Patient Active Problem List   Diagnosis Date Noted  . Stroke ([place]) 03/26/2017  . Essential hypertension 03/26/2017    Past Surgical History:  Procedure Laterality Date  . ABDOMINAL HYSTERECTOMY    . gum operation      OB History    No data available       Home Medications    Prior to Admission medications   Medication Sig Start Date End Date Taking? Authorizing Provider  amLODipine (NORVASC) 10 MG tablet Take 1 tablet (10 mg total) by mouth daily. 03/18/17  Yes Argentina Donovan, PA-C  meclizine (ANTIVERT) 25 MG tablet Take 0.5-1 tablets (12.5-25 mg total) by mouth 3 (three) times daily as needed for dizziness or nausea. 02/18/17  Yes Margarita Mail,  PA-C  meloxicam (MOBIC) 7.5 MG tablet 1 bid prn pain 03/18/17  Yes Argentina Donovan, PA-C  oxyCODONE-acetaminophen (PERCOCET) 5-325 MG tablet Take 1 tablet by mouth every 6 (six) hours as needed for severe pain. 02/18/17  Yes Margarita Mail, PA-C    Family History Family History  Problem Relation Age of Onset  . Hypertension Father   . Diabetes Father     Social History Social History  Substance Use Topics  . Smoking status: Current Every Day Smoker  . Smokeless tobacco: Never Used  . Alcohol use No     Allergies   Patient has no known allergies.   Review of Systems Review of Systems  Constitutional: Negative for chills and fever.  HENT: Negative for facial swelling and sore throat.   Respiratory: Negative for shortness of breath.   Cardiovascular: Negative for chest pain.  Gastrointestinal: Negative for abdominal pain, nausea and vomiting.  Genitourinary: Negative for dysuria.  Musculoskeletal: Positive for myalgias. Negative for back pain.  Skin: Negative for rash and wound.  Neurological: Positive for dizziness and headaches. Negative for numbness.  Psychiatric/Behavioral: The patient is not nervous/anxious.      Physical Exam Updated Vital Signs BP (!) 196/92   Pulse 72   Temp 98.3 F (36.8 C) (Oral)   Resp 16   SpO2 100%   Physical Exam  Constitutional: She appears well-developed and well-nourished. No distress.  HENT:  Head: Normocephalic and atraumatic.  Mouth/Throat: Oropharynx is clear and moist. No oropharyngeal exudate.  Eyes: Conjunctivae and EOM are normal. Right eye exhibits no discharge. Left eye exhibits no discharge. No scleral icterus. Right pupil is not reactive. Right pupil is round. Left pupil is not reactive. Left pupil is round. Pupils are equal.  Neck: Normal range of motion. Neck supple. No thyromegaly present.  Cardiovascular: Normal rate, regular rhythm, normal heart sounds and intact distal pulses.  Exam reveals no gallop and no  friction rub.   No murmur heard. Pulmonary/Chest: Effort normal and breath sounds normal. No stridor. No respiratory distress. She has no wheezes. She has no rales.  Abdominal: Soft. Bowel sounds are normal. She exhibits no distension. There is no tenderness. There is no rebound and no guarding.  Musculoskeletal: She exhibits no edema.  No tenderness where patient reports left arm pain  Lymphadenopathy:    She has no cervical adenopathy.  Neurological: She is alert. Coordination normal.  CN 3-12 intact; normal sensation throughout; 5/5 strength in all 4 extremities; equal bilateral grip strength  Skin: Skin is warm and dry. No rash noted. She is not diaphoretic. No pallor.  Psychiatric: She has a normal mood and affect.  Nursing note and vitals reviewed.    ED Treatments / Results  Labs (all labs ordered are listed, but only abnormal results are displayed) Labs Reviewed  COMPREHENSIVE METABOLIC PANEL - Abnormal; Notable for the following:       Result Value   Glucose, Bld 176 (*)    ALT 10 (*)    All other components within normal limits  CBC WITH DIFFERENTIAL/PLATELET  PROTIME-INR  APTT    EKG  EKG Interpretation None       Radiology Ct Angio Head W Or Wo Contrast  Result Date: 03/26/2017 CLINICAL DATA:  Infarct identified on head CT EXAM: CT ANGIOGRAPHY HEAD AND NECK TECHNIQUE: Multidetector CT imaging of the head and neck was performed using the standard protocol during bolus administration of intravenous contrast. Multiplanar CT image reconstructions and MIPs were obtained to evaluate the vascular anatomy. Carotid stenosis measurements (when applicable) are obtained utilizing NASCET criteria, using the distal internal carotid diameter as the denominator. CONTRAST:  50 mL Isovue 370 COMPARISON:  Head CT 03/26/2017 FINDINGS: CTA NECK FINDINGS Aortic arch: There is no aneurysm or dissection of the visualized ascending aorta or aortic arch. There is a normal 3 vessel branching  pattern. The visualized proximal subclavian arteries are normal. Right carotid system: The right common carotid origin is widely patent. There is no common carotid or internal carotid artery dissection or aneurysm. No hemodynamically significant stenosis. Left carotid system: The left common carotid origin is widely patent. There is no common carotid or internal carotid artery dissection or aneurysm. No hemodynamically significant stenosis. Vertebral arteries: The vertebral system is left dominant. Both vertebral artery origins are normal. Both vertebral arteries are normal to their confluence with the basilar artery. Skeleton: There is no bony spinal canal stenosis. No lytic or blastic lesions. Other neck: The nasopharynx is clear. The oropharynx and hypopharynx are normal. The epiglottis is normal. The supraglottic larynx, glottis and subglottic larynx are normal. No retropharyngeal collection. The parapharyngeal spaces are preserved. The parotid and submandibular glands are normal. No sialolithiasis or salivary ductal dilatation. The thyroid gland is normal. There is no cervical lymphadenopathy. Upper chest: No pneumothorax or pleural effusion. No nodules or masses. Review of the MIP images confirms the above findings CTA HEAD FINDINGS Anterior circulation: --Intracranial internal carotid arteries: Mild calcific  atherosclerosis without hemodynamically significant stenosis. --Anterior cerebral arteries: Normal --Middle cerebral arteries: At the origin of the right middle cerebral artery, there is a posteriorly projecting aneurysm that measures 4 mm at its neck and 7 mm base to apex. At the right MCA bifurcation, there is an additional aneurysm that is oriented inferiorly and measures 3 mm at its base and 4 mm base to apex. --Posterior communicating arteries: Absent bilaterally. Posterior circulation: --Posterior cerebral arteries: Normal. --Superior cerebellar arteries: Normal. --Basilar artery: There is smooth  tapering of the distal basilar artery which is narrowed to approximately 30% the caliber of the proximal basilar lumen. --Anterior inferior cerebellar arteries: Normal. --Posterior inferior cerebellar arteries: Normal. Venous sinuses: As permitted by contrast timing, patent. Anatomic variants: None Delayed phase: No parenchymal contrast enhancement. Review of the MIP images confirms the above findings IMPRESSION: 1. No emergent large vessel occlusion of the intracranial circulation. 2. 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 3. 3 x 4 mm inferiorly directed aneurysm arising from the bifurcation of the right middle cerebral artery. 4. 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 Both PCAs are of normal caliber. 5. No occlusion, dissection or hemodynamically significant stenosis of the cervical arteries. Electronically Signed   By: Ulyses Jarred M.D.   On: 03/26/2017 22:51   Ct Head Wo Contrast  Result Date: 03/26/2017 CLINICAL DATA:  Intermittent headaches and vertigo since February. History of hypertension. EXAM: CT HEAD WITHOUT CONTRAST TECHNIQUE: Contiguous axial images were obtained from the base of the skull through the vertex without intravenous contrast. COMPARISON:  None. FINDINGS: BRAIN: No intraparenchymal hemorrhage, mass effect nor midline shift. The ventricles and sulci are normal for age. Mesial RIGHT occipital lobe early encephalomalacia. Old bilateral cerebellar infarcts. Patchy supratentorial white matter hypodensities within normal range for patient's age, though non-specific are most compatible with chronic small vessel ischemic disease. No acute large vascular territory infarcts. No abnormal extra-axial fluid collections. Basal cisterns are patent. VASCULAR: Moderate calcific atherosclerosis of the carotid siphons and to lesser extent  intradural vertebral artery's. SKULL: No skull fracture. No significant scalp soft tissue swelling. SINUSES/ORBITS: The mastoid air-cells and included paranasal sinuses are well-aerated.The included ocular globes and orbital contents are non-suspicious. OTHER: None. IMPRESSION: Subacute to chronic RIGHT occipital lobe/ PCA territory infarct. Old bilateral cerebellar infarcts. Recommend MRA versus CTA to evaluate for posterior circulation abnormality. Mild chronic small vessel ischemic disease. Electronically Signed   By: Elon Alas M.D.   On: 03/26/2017 19:25   Ct Angio Neck W And/or Wo Contrast  Result Date: 03/26/2017 CLINICAL DATA:  Infarct identified on head CT EXAM: CT ANGIOGRAPHY HEAD AND NECK TECHNIQUE: Multidetector CT imaging of the head and neck was performed using the standard protocol during bolus administration of intravenous contrast. Multiplanar CT image reconstructions and MIPs were obtained to evaluate the vascular anatomy. Carotid stenosis measurements (when applicable) are obtained utilizing NASCET criteria, using the distal internal carotid diameter as the denominator. CONTRAST:  50 mL Isovue 370 COMPARISON:  Head CT 03/26/2017 FINDINGS: CTA NECK FINDINGS Aortic arch: There is no aneurysm or dissection of the visualized ascending aorta or aortic arch. There is a normal 3 vessel branching pattern. The visualized proximal subclavian arteries are normal. Right carotid system: The right common carotid origin is widely patent. There is no common carotid or internal carotid artery dissection or aneurysm. No hemodynamically significant  stenosis. Left carotid system: The left common carotid origin is widely patent. There is no common carotid or internal carotid artery dissection or aneurysm. No hemodynamically significant stenosis. Vertebral arteries: The vertebral system is left dominant. Both vertebral artery origins are normal. Both vertebral arteries are normal to their confluence with the  basilar artery. Skeleton: There is no bony spinal canal stenosis. No lytic or blastic lesions. Other neck: The nasopharynx is clear. The oropharynx and hypopharynx are normal. The epiglottis is normal. The supraglottic larynx, glottis and subglottic larynx are normal. No retropharyngeal collection. The parapharyngeal spaces are preserved. The parotid and submandibular glands are normal. No sialolithiasis or salivary ductal dilatation. The thyroid gland is normal. There is no cervical lymphadenopathy. Upper chest: No pneumothorax or pleural effusion. No nodules or masses. Review of the MIP images confirms the above findings CTA HEAD FINDINGS Anterior circulation: --Intracranial internal carotid arteries: Mild calcific atherosclerosis without hemodynamically significant stenosis. --Anterior cerebral arteries: Normal --Middle cerebral arteries: At the origin of the right middle cerebral artery, there is a posteriorly projecting aneurysm that measures 4 mm at its neck and 7 mm base to apex. At the right MCA bifurcation, there is an additional aneurysm that is oriented inferiorly and measures 3 mm at its base and 4 mm base to apex. --Posterior communicating arteries: Absent bilaterally. Posterior circulation: --Posterior cerebral arteries: Normal. --Superior cerebellar arteries: Normal. --Basilar artery: There is smooth tapering of the distal basilar artery which is narrowed to approximately 30% the caliber of the proximal basilar lumen. --Anterior inferior cerebellar arteries: Normal. --Posterior inferior cerebellar arteries: Normal. Venous sinuses: As permitted by contrast timing, patent. Anatomic variants: None Delayed phase: No parenchymal contrast enhancement. Review of the MIP images confirms the above findings IMPRESSION: 1. No emergent large vessel occlusion of the intracranial circulation. 2. 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 3. 3 x 4 mm  inferiorly directed aneurysm arising from the bifurcation of the right middle cerebral artery. 4. 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 Both PCAs are of normal caliber. 5. No occlusion, dissection or hemodynamically significant stenosis of the cervical arteries. Electronically Signed   By: Ulyses Jarred M.D.   On: 03/26/2017 22:51    Procedures Procedures (including critical care time)  Medications Ordered in ED Medications  iopamidol (ISOVUE-370) 76 % injection (50 mLs  Contrast Given 03/26/17 2220)     Initial Impression / Assessment and Plan / ED Course  I have reviewed the triage vital signs and the nursing notes.  Pertinent labs & imaging results that were available during my care of the patient were reviewed by me and considered in my medical decision making (see chart for details).     CT head shows subacute to chronic right occipital lobe/PCA territory infarct; old bilateral cerebellar infarcts; recommend MRA versus CTA to evaluate for posterior circulation abnormality. I consulted Dr. Cheral Marker, neurologist, who advised CTA head and neck and admission to hospitalist service. CTA head and neck shows multiple aneurysms and stenosis of posterior cerebral arteries, as shown above. I consulted Dr. Loleta Books with [facility] who will admit the patient for further evaluation and treatment. Dr. Cheral Marker will consult on the patient's case. Patient daughter understand and agree with plan. Discussed patient case with Dr. Laneta Simmers who agrees with plan.  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

## 2017-03-26 NOTE — H&P (Signed)
History and Physical  Patient Name: Amy Howell     WPY:[id number]    DOB: 04-04-1956    DOA: 03/26/2017 PCP: No PCP Per Patient   Patient coming from: Dr. Zenia Resides office     Chief Complaint: Headache  HPI: Amy Howell is a 61 y.o. female with a past medical history significant for HTN who presents with 2 months headache.  The [place]. Around that time she started to get right-sided headaches, occipital, right frontal, and right neck. Sometimes these are in the left eye which says they're much more severe, and associated with phonophobia. Sometimes the headaches wake her from sleep. She's also noticed change in her vision primarily in her left eye she thinks.  No changes to peripheral vision, no memory change, no ataxia.  Has had intermittent vertigo for maybe a year or more.  No focal weakness, numbness, slurred speech.  Today she saw Dr. Katy Fitch for follow up visual changes and he had concern for intracranial hemorrhage so sent her to the ER.  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 Negative for tingling, tremors, sensory change, speech change, focal weakness, seizures and loss of consciousness.  All other systems reviewed and are negative.        Past Medical History:  Diagnosis Date  . Hypertension   . Vertigo     Past Surgical History:  Procedure Laterality Date  . ABDOMINAL HYSTERECTOMY    . gum operation      Social History: Patient lives with her daughter.   She is from the [place], lived her childhood in [place].  Patient walks unassisted.  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  She is an active low dose smoker.    No Known Allergies  Family history: family history includes Diabetes in her father; Hypertension in her father.  Prior to Admission medications   Medication Sig Start Date End Date Taking? Authorizing Provider  amLODipine (NORVASC) 10 MG tablet Take 1 tablet (10 mg total) by mouth daily. 03/18/17   Argentina Donovan, PA-C  Cyanocobalamin (VITAMIN B 12 PO) Take 1 tablet by mouth daily.    Historical Provider, MD  meclizine (ANTIVERT) 25 MG tablet Take 0.5-1 tablets (12.5-25 mg total) by mouth 3 (three) times daily as needed for dizziness or nausea. Patient not taking: Reported on 03/18/2017 02/18/17   Margarita Mail, PA-C  meloxicam (MOBIC) 7.5 MG tablet 1 bid prn pain 03/18/17   Argentina Donovan, PA-C  oxyCODONE-acetaminophen (PERCOCET) 5-325 MG tablet Take 1 tablet by mouth every 6 (six) hours as needed for severe pain. 02/18/17   Margarita Mail, PA-C  trimethoprim-polymyxin b (POLYTRIM) ophthalmic solution every 4 (four) hours.    Historical Provider, MD     Physical Exam: BP (!) 201/85   Pulse 69   Temp 98.3 F (36.8 C) (Oral)   Resp 16   SpO2 100%  General appearance: Well-developed, adult female, alert and in no acute distress.   Eyes: Anicteric, conjunctiva pink,  lids and lashes normal. PERRL.    ENT: No nasal deformity, discharge, epistaxis.  Hearing normal. OP moist without lesions.   Dentition good. Lymph: No cervical, supraclavicular or axillary lymphadenopathy. Skin: Warm and dry.  No jaundice.  No suspicious rashes or lesions. Cardiac: RRR, nl S1-S2, no murmurs appreciated.  Capillary refill is brisk.  JVP normal.  No LE edema.  Radial and DP pulses 2+ and symmetric.  No carotid bruits. Respiratory: Normal respiratory rate and rhythm.  CTAB without rales or wheezes. GI: Abdomen soft without rigidity.  No  TTP. No ascites, distension, no hepatosplenomegaly.   MSK: No deformities or effusions. Neuro: Visual fields may have a left homonymous hemianopsia, I most notice a left (midline, nasal) deficit in her right eye.  Pupils are dilated from Ophtho office, reactive, symmetric.   Extraocular movements are intact, without nystagmus. Cranial nerve 5 is within normal limits. Cranial nerve 7 is symmetrical. Cranial nerve 8 is within normal limits. Cranial nerves 9 and 10 reveal equal palate elevation. Cranial nerve 11 reveals sternocleidomastoid strong. Cranial nerve 12 is midline. I do not note a deficit in motor strength testing in the upper and lower extremities bilaterally with normal motor, tone and bulk. Romberg maneuver is negative for pathology. Finger-to-nose testing is within normal limits. Speech is fluent. Naming is grossly intact. Attention span and concentration are within normal limits.   Psych: The patient is oriented to time, place and person. Behavior appropriate.  Affect blunted.  Recall, recent and remote, as well as general fund of knowledge seem within normal limits. No evidence of aural or visual hallucinations or delusions.       Labs on Admission:  I have personally reviewed following labs and imaging studies: CBC:  Recent Labs Lab 03/26/17 1856  WBC 6.6  NEUTROABS 4.2  HGB 12.6  HCT 39.0  MCV 93.8  PLT 008   Basic Metabolic Panel:  Recent Labs Lab 03/26/17 1856  NA 138  K 3.6  CL 101  CO2 29  GLUCOSE 176*  BUN 15  CREATININE 0.91  CALCIUM 9.9   GFR: Estimated Creatinine Clearance: 44.5 mL/min (by C-G formula based on SCr of 0.91 mg/dL). Liver Function Tests:  Recent Labs Lab 03/26/17 1856  AST 20  ALT 10*  ALKPHOS 81  BILITOT 0.4  PROT 7.8  ALBUMIN 4.1   No results for input(s): LIPASE, AMYLASE in the last 168 hours. No results for input(s): AMMONIA in the last 168 hours. Coagulation Profile:  Recent Labs Lab 03/26/17 1856  INR 1.05     Cardiac Enzymes: No results for input(s): CKTOTAL, CKMB, CKMBINDEX, TROPONINI in the last 168 hours. BNP (last 3 results) No results for input(s): PROBNP in the last 8760 hours. HbA1C: No results for input(s): HGBA1C in the last 72 hours. CBG: No results for input(s): GLUCAP in the last 168 hours. Lipid Profile: No results for input(s): CHOL, HDL, LDLCALC, TRIG, CHOLHDL, LDLDIRECT in the last 72 hours. Thyroid Function Tests: No results for input(s): TSH, T4TOTAL, FREET4, T3FREE, THYROIDAB in the last 72 hours. Anemia Panel: No results for input(s): VITAMINB12, FOLATE, FERRITIN, TIBC, IRON, RETICCTPCT in the last 72 hours. Sepsis Labs:  Invalid input(s): PROCALCITONIN, LACTICIDVEN No results found for this or any previous visit (from the past 240 hour(s)).    Radiological Exams on Admission: Personally reviewed CT head report: Ct Head Wo Contrast  Result Date: 03/26/2017 CLINICAL DATA:  Intermittent headaches and vertigo since February. History of hypertension. EXAM: CT HEAD WITHOUT CONTRAST TECHNIQUE:  Contiguous axial images were obtained from the base of the skull through the vertex without intravenous contrast. COMPARISON:  None. FINDINGS: BRAIN: No intraparenchymal hemorrhage, mass effect nor midline shift. The ventricles and sulci are normal for age. Mesial RIGHT occipital lobe early encephalomalacia. Old bilateral cerebellar infarcts. Patchy supratentorial white matter hypodensities within normal range for patient's age, though non-specific are most compatible with chronic small vessel ischemic disease. No acute large vascular territory infarcts. No abnormal extra-axial fluid collections. Basal cisterns are patent. VASCULAR: Moderate calcific atherosclerosis of the carotid siphons and to lesser extent intradural vertebral artery's. SKULL: No skull fracture. No significant scalp soft tissue swelling. SINUSES/ORBITS: The mastoid air-cells and included paranasal sinuses are  well-aerated.The included ocular globes and orbital contents are non-suspicious. OTHER: None. IMPRESSION: Subacute to chronic RIGHT occipital lobe/ PCA territory infarct. Old bilateral cerebellar infarcts. Recommend MRA versus CTA to evaluate for posterior circulation abnormality. Mild chronic small vessel ischemic disease. Electronically Signed   By: Elon Alas M.D.   On: 03/26/2017 19:25         Assessment/Plan  1. Subacute Stroke:  This is new.  MRI pending.  ?dissection, CTA pending. -Admit to telemetry -Neuro checks, NIHSS per protocol -Daily aspirin 325 mg for now -Given subacute nature, will restart BP meds -Lipids, hemoglobin A1c -MR brain ordered -CTA head and neck ordered -Echocardiogram ordered -PT/OT/SLP consultation -Consult to Neurology, appreciate recommendations   2. Smoking:  -Cessation recommended, modalities discussed  3. HTN:  -Continue amlodipine            DVT prophylaxis: Lovenox  Code Status: FULL  Family Communication: Daughter  Disposition Plan: Anticipate Stroke work up as above and consult to ancillary services.  Expect discharge within 2 days. Consults called: Neurology, Dr. Charna Archer has seen patient. Admission status: Telemetry, INPATIENT status  Core measures: -VTE prophylaxis ordered at time of admission -Aspirin ordered at admission -Atrial fibrillation: not previously known or present -tPA not given because of outside of stroke window -Dysphagia screen ordered in ER -Lipids ordered -PT eval ordered -Smoking cessation recommended    Medical decision making: Patient seen at 9:30 PM on 03/26/2017.  The patient was discussed with Dr. Cheral Marker and Armstead Peaks. What exists of the patient's chart was reviewed in depth and summarized above.  Clinical condition: stable.       Edwin Dada [facility] Pager [phone]

## 2017-03-27 ENCOUNTER — Inpatient Hospital Stay (HOSPITAL_COMMUNITY): Payer: Self-pay

## 2017-03-27 DIAGNOSIS — I651 Occlusion and stenosis of basilar artery: Secondary | ICD-10-CM

## 2017-03-27 DIAGNOSIS — I6789 Other cerebrovascular disease: Secondary | ICD-10-CM

## 2017-03-27 DIAGNOSIS — R634 Abnormal weight loss: Secondary | ICD-10-CM

## 2017-03-27 DIAGNOSIS — I671 Cerebral aneurysm, nonruptured: Secondary | ICD-10-CM

## 2017-03-27 DIAGNOSIS — F17200 Nicotine dependence, unspecified, uncomplicated: Secondary | ICD-10-CM

## 2017-03-27 LAB — LIPID PANEL
Cholesterol: 183 mg/dL (ref 0–200)
HDL: 40 mg/dL — ABNORMAL LOW (ref >40)
LDL CALC: 126 mg/dL — AB (ref 0–99)
Total CHOL/HDL Ratio: 4.6 RATIO
Triglycerides: 84 mg/dL (ref <150)
VLDL: 17 mg/dL (ref 0–40)

## 2017-03-27 LAB — ECHOCARDIOGRAM COMPLETE
Height: 60.5 in
WEIGHTICAEL: 1584 [oz_av]

## 2017-03-27 MED ORDER — ATORVASTATIN CALCIUM 40 MG PO TABS
40.0000 mg | ORAL_TABLET | Freq: Every day | ORAL | Status: DC
  Administered 2017-03-27 – 2017-03-28 (×2): 40 mg via ORAL
  Filled 2017-03-27 (×4): qty 1

## 2017-03-27 MED ORDER — ACETAMINOPHEN 325 MG PO TABS: 650.0000 mg | ORAL_TABLET | Freq: Four times a day (QID) | ORAL | Status: DC | PRN

## 2017-03-27 MED ORDER — IOPAMIDOL (ISOVUE-300) INJECTION 61%
INTRAVENOUS | Status: AC
  Administered 2017-03-27: 75 mL
  Filled 2017-03-27: qty 75

## 2017-03-27 MED ORDER — IOPAMIDOL (ISOVUE-300) INJECTION 61%
INTRAVENOUS | Status: AC
  Filled 2017-03-27: qty 30

## 2017-03-27 MED ORDER — GADOBENATE DIMEGLUMINE 529 MG/ML IV SOLN
8.0000 mL | Freq: Once | INTRAVENOUS | Status: AC | PRN
  Administered 2017-03-27: 10 mL via INTRAVENOUS

## 2017-03-27 NOTE — Progress Notes (Signed)
Arrived from ED. Alert and oriented. C/O of throbbing, sharp headache 9/10. Call light within reach.

## 2017-03-27 NOTE — Progress Notes (Signed)
STROKE TEAM PROGRESS NOTE   HISTORY OF PRESENT ILLNESS (per record) Amy Howell is an 61 y.o. female who was sent to [facility] ED from her ophthalmologist's office after exam there revealed a left monocular altitudinal visual field defect, which is evident on the visual field maps brought in from the ophthalmologist's office by the patient. Her ophthalmologist was concerned for possible intracerebral hemorrhage as well, given elevated pressure in her left eye. She states that her symptoms of left eye vision loss have been intermittent since February. She also has had vertigo, severe right eye pain, right face pain and left eye pain since February. Patient was not administered IV t-PA. She was admitted for further evaluation and treatment.   SUBJECTIVE (INTERVAL HISTORY) No family at bedside. 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 Stated unintentional weight loss since 01/2017 and one time large LGIB. She also has constant HA since 01/2017, worse at midnight, waking up from sleep. Has vision changes on and off, but getting worse. Eye doctor appointment yesterday showed hemianopia.    OBJECTIVE Temp:  [97.7 F (36.5 C)-98.4 F (36.9 C)] 98.3 F (36.8 C) (04/26 1340) Pulse Rate:  [69-86] 75 (04/26 1340) Cardiac Rhythm: Normal sinus rhythm (04/26 0700) Resp:  [13-18] 18 (04/26 1340) BP: (117-223)/(70-92) 135/70 (04/26 1340) SpO2:  [97 %-100 %] 98 % (04/26 1340) Weight:  [44.9 kg (99 lb)-45.3 kg (99 lb 12.8 oz)] 44.9 kg (99 lb) (04/26 0820)  CBC:  Recent Labs Lab 03/26/17 1856  WBC 6.6  NEUTROABS 4.2  HGB 12.6  HCT 39.0  MCV 93.8  PLT 409    Basic Metabolic Panel:  Recent Labs Lab 03/26/17 1856  NA 138  K 3.6  CL 101  CO2 29  GLUCOSE 176*  BUN 15  CREATININE 0.91  CALCIUM 9.9    Lipid Panel:    Component Value Date/Time   CHOL 183 03/27/2017 1056   TRIG 84 03/27/2017 1056   HDL 40 (L) 03/27/2017 1056   CHOLHDL 4.6 03/27/2017 1056   VLDL 17 03/27/2017 1056   LDLCALC 126 (H)  03/27/2017 1056   HgbA1c: No results found for: HGBA1C Urine Drug Screen: No results found for: LABOPIA, COCAINSCRNUR, LABBENZ, AMPHETMU, THCU, LABBARB  Alcohol Level No results found for: Ava I have personally reviewed the radiological images below and agree with the radiology interpretations.  Ct Head Wo Contrast 03/26/2017 Subacute to chronic RIGHT occipital lobe/ PCA territory infarct. Old bilateral cerebellar infarcts. Mild chronic small vessel ischemic disease.   Ct Angio Head W Or Wo Contrast Ct Angio Neck W And/or Wo Contrast 03/26/2017 1. No emergent large vessel occlusion of the intracranial circulation. 2. 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 3. 3 x 4 mm inferiorly directed aneurysm arising from the bifurcation of the right middle cerebral artery. 4. 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 Both PCAs are of normal caliber. 5. No occlusion, dissection or hemodynamically significant stenosis of the cervical arteries.   Mr Kizzie Fantasia Contrast 03/27/2017 1. Subacute infarcts in the bilateral occipital poles. Small more acute infarcts may be superimposed. Patient has other small remote posterior circulation infarcts. 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 2. Known cerebral aneurysms.   2D Echocardiogram  - Left ventricle: GLLS is normal at -19.9% The cavity size was normal. There was mild concentric hypertrophy. Systolic  function was vigorous. The estimated ejection fraction was in the range of 65% to 70%. Wall motion was normal; there were no regional wall motion abnormalities. There was an increased relative contribution of atrial contraction to ventricular filling. Doppler parameters are consistent with abnormal left ventricular relaxation (grade 1 diastolic dysfunction). - Aortic valve:  Trileaflet; mildly thickened, mildly calcified leaflets. There was mild regurgitation. - Tricuspid valve: There was mild regurgitation.  CT chest and abd/pelvis pending  LE venous doppler pending   PHYSICAL EXAM  Temp:  [97.7 F (36.5 C)-98.4 F (36.9 C)] 98.3 F (36.8 C) (04/26 1340) Pulse Rate:  [69-86] 75 (04/26 1340) Resp:  [13-18] 18 (04/26 1340) BP: (117-223)/(70-92) 135/70 (04/26 1340) SpO2:  [97 %-100 %] 98 % (04/26 1340) Weight:  [99 lb (44.9 kg)-99 lb 12.8 oz (45.3 kg)] 99 lb (44.9 kg) (04/26 0820)  General - Well nourished, well developed, in no apparent distress.  Ophthalmologic - Fundi not visualized due to eye movement.  Cardiovascular - Regular rate and rhythm.  Mental Status -  Level of arousal and orientation to time, place, and person were intact. Language including expression, naming, repetition, comprehension was assessed and found intact. Fund of Knowledge was assessed and was intact.  Cranial Nerves II - XII - II - left homonymous hemianopia. III, IV, VI - Extraocular movements intact. V - Facial sensation intact bilaterally. VII - Facial movement intact bilaterally. VIII - Hearing & vestibular intact bilaterally. X - Palate elevates symmetrically. XI - Chin turning & shoulder shrug intact bilaterally. XII - Tongue protrusion intact.  Motor Strength - The patient's strength was normal in all extremities and pronator drift was absent.  Bulk was normal and fasciculations were absent.   Motor Tone - Muscle tone was assessed at the neck and appendages and was normal.  Reflexes - The patient's reflexes were 1+ in all extremities and she had no pathological reflexes.  Sensory - Light touch, temperature/pinprick were assessed and were symmetrical.    Coordination - The patient had normal movements in the hands with no ataxia or dysmetria.  Tremor was absent.  Gait and Station - deferred.   ASSESSMENT/PLAN Amy Howell is a 61 y.o. female with  history of HTN presenting with HA x 2 months and vision field defect found at opthalmologists ofice. She did not receive IV t-PA.   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 Needs to rule out maligancy.  Resultant  Left hemianopia  Ct head subacute to chronic R occipital/PCA infarct. Old B cerebellar infarcts. small vessel disease  CTA head / neck no ELVO. 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   MRI  subacute B occipital infarcts with superimposed acute infarcts. Old posterior circulation infarcts. BA stenosis. cerebral aneurysms.  LE Doppler  pending  2D Echo  EF 65-70%. no source of embolus   CT Abd/Chest/Pelvis pending to rule out malignant source  TEE pending to look for embolic source. Arranged with [place] for tomorrow.  (I have made patient NPO after midnight tonight).   May need loop pending other workup. Have alerted cardiology  LDL 126  HgbA1c pending  Lovenox 30 mg sq daily for VTE prophylaxis  Diet Heart Room service appropriate? Yes; Fluid consistency: Thin  Diet NPO time specified  No antithrombotic prior to admission, now on aspirin 325 mg daily.   Patient counseled to be compliant with her antithrombotic medications  Ongoing aggressive stroke risk factor management  Therapy recommendations:  No PT  Disposition:  pending   Unintentional weight loss with one time LGIB  Lost 5 lbs for the last 2 months  01/2017 one time large LGIB  CT abd/pelvis/chest pending to rule out malignancy  Cerebral aneurysms   7x4 R MCA aneurysm.   3x4 inferior bifurcation R MCA aneurysm.  Need outpt referral to interventional neuroradiology  Hypertension  Elevated on admission 233/90, 192/81  Stable now Permissive hypertension (OK if < 220/120) but gradually normalize in 5-7 days Long-term BP goal normotensive  Hyperlipidemia  Home meds:  No  statin  LDL 126, goal < 70  Now on lipitor 40 mg daily  Continue statin at discharge  Tobacco abuse  Current smoker  Smoking cessation counseling provided  Pt is willing to quit  Other Stroke Risk Factors  Chronic vertigo  Hospital day # 1  Rosalin Hawking, MD PhD Stroke Neurology 03/27/2017 6:31 PM    To contact Stroke Continuity provider, please refer to [email or web address] After hours, contact General Neurology

## 2017-03-27 NOTE — Progress Notes (Signed)
    [facility] has been requested to perform a transesophageal echocardiogram on Amy Howell for stroke.  After careful review of history and examination, the risks and benefits of transesophageal echocardiogram have been explained including risks of esophageal damage, perforation (1:10,000 risk), bleeding, pharyngeal hematoma as well as other potential complications associated with conscious sedation including aspiration, arrhythmia, respiratory failure and death. Alternatives to treatment were discussed, questions were answered. Patient is willing to proceed.   Murray Hodgkins, NP  03/27/2017 8:22 PM

## 2017-03-27 NOTE — Evaluation (Signed)
SLP Cancellation Note  Patient Details Name: Amy Howell MRN: [id number] DOB: [birth year]   Cancelled treatment:       Reason Eval/Treat Not Completed: Patient at procedure or test/unavailable (pt at MRI at this time, will continue efforts to conduct SLE; per RN pt communicating well at this time)   Luanna Salk, [facility] SLP [phone]

## 2017-03-27 NOTE — Progress Notes (Signed)
PROGRESS NOTE    Amy Howell  WNI:[id number] DOB: 12-Sep-1956 DOA: 03/26/2017 PCP: No PCP Per Patient   Brief Narrative: 61 year old female with history of hypertension presented with intermittent headache. Patient was seen by ophthalmologist for change in vision when there was concern for increased intracranial pressure therefore sent to the hospital for further evaluation. Patient was found to have a stroke.  Assessment & Plan:  #  Subacute right occipital lobe ischemic infarction: Evaluation ongoing by neurologist. -Follow-up MRI of the brain, echo, vascular lower extremity Doppler -Continue aspirin and Lipitor -LDL 126, A1c pending -PT OT therapy -Social worker evaluation  #Essential hypertension: Continue to monitor blood pressure. Norvasc on hold.  Patient was recently moved from [place]  with her daughter. She was asking to talk to Education officer, museum. Social worker consulted. Continue current medical and supportive care. Follow-up neurology.  Principal Problem:   Stroke [facility]) Active Problems:   Essential hypertension  DVT prophylaxis: Lovenox subcutaneous Code Status: Full code Family Communication: No family at bedside Disposition Plan: Likely discharge home in 1-2 days    Consultants:   Neurology  Procedures: None Antimicrobials: None  Subjective: Patient was seen and examined at bedside. Reported intermittent headache but denied dizziness, chest pain, shortness of breath. No nausea vomiting. Objective: Vitals:   03/27/17 0800 03/27/17 1000 03/27/17 1200 03/27/17 1340  BP: (!) 155/88 (!) 192/81 (!) 156/78 135/70  Pulse: 84 86 80 75  Resp: 18 18 18 18   Temp: 98 F (36.7 C) 98 F (36.7 C) 98 F (36.7 C) 98.3 F (36.8 C)  TempSrc: Oral Oral Oral Oral  SpO2: 100% 98% 100% 98%  Weight:      Height:       No intake or output data in the 24 hours ending 03/27/17 1422 Filed Weights   03/26/17 2339  Weight: 45.3 kg (99 lb 12.8 oz)     Examination:  General exam: Appears calm and comfortable  Respiratory system: Clear to auscultation. Respiratory effort normal. No wheezing or crackle Cardiovascular system: S1 & S2 heard, RRR.  No pedal edema. Gastrointestinal system: Abdomen is nondistended, soft and nontender. Normal bowel sounds heard. Central nervous system: Alert and oriented. No focal neurological deficits. Extremities: Symmetric 5 x 5 power. Skin: No rashes, lesions or ulcers Psychiatry: Judgement and insight appear normal. Mood & affect appropriate.     Data Reviewed: I have personally reviewed following labs and imaging studies  CBC:  Recent Labs Lab 03/26/17 1856  WBC 6.6  NEUTROABS 4.2  HGB 12.6  HCT 39.0  MCV 93.8  PLT 381   Basic Metabolic Panel:  Recent Labs Lab 03/26/17 1856  NA 138  K 3.6  CL 101  CO2 29  GLUCOSE 176*  BUN 15  CREATININE 0.91  CALCIUM 9.9   GFR: Estimated Creatinine Clearance: 45.3 mL/min (by C-G formula based on SCr of 0.91 mg/dL). Liver Function Tests:  Recent Labs Lab 03/26/17 1856  AST 20  ALT 10*  ALKPHOS 81  BILITOT 0.4  PROT 7.8  ALBUMIN 4.1   No results for input(s): LIPASE, AMYLASE in the last 168 hours. No results for input(s): AMMONIA in the last 168 hours. Coagulation Profile:  Recent Labs Lab 03/26/17 1856  INR 1.05   Cardiac Enzymes: No results for input(s): CKTOTAL, CKMB, CKMBINDEX, TROPONINI in the last 168 hours. BNP (last 3 results) No results for input(s): PROBNP in the last 8760 hours. HbA1C: No results for input(s): HGBA1C in the last 72 hours. CBG: No results  for input(s): GLUCAP in the last 168 hours. Lipid Profile:  Recent Labs  03/27/17 1056  CHOL 183  HDL 40*  LDLCALC 126*  TRIG 84  CHOLHDL 4.6   Thyroid Function Tests: No results for input(s): TSH, T4TOTAL, FREET4, T3FREE, THYROIDAB in the last 72 hours. Anemia Panel: No results for input(s): VITAMINB12, FOLATE, FERRITIN, TIBC, IRON, RETICCTPCT in  the last 72 hours. Sepsis Labs: No results for input(s): PROCALCITON, LATICACIDVEN in the last 168 hours.  No results found for this or any previous visit (from the past 240 hour(s)).       Radiology Studies: Ct Angio Head W Or Wo Contrast  Result Date: 03/26/2017 CLINICAL DATA:  Infarct identified on head CT EXAM: CT ANGIOGRAPHY HEAD AND NECK TECHNIQUE: Multidetector CT imaging of the head and neck was performed using the standard protocol during bolus administration of intravenous contrast. Multiplanar CT image reconstructions and MIPs were obtained to evaluate the vascular anatomy. Carotid stenosis measurements (when applicable) are obtained utilizing NASCET criteria, using the distal internal carotid diameter as the denominator. CONTRAST:  50 mL Isovue 370 COMPARISON:  Head CT 03/26/2017 FINDINGS: CTA NECK FINDINGS Aortic arch: There is no aneurysm or dissection of the visualized ascending aorta or aortic arch. There is a normal 3 vessel branching pattern. The visualized proximal subclavian arteries are normal. Right carotid system: The right common carotid origin is widely patent. There is no common carotid or internal carotid artery dissection or aneurysm. No hemodynamically significant stenosis. Left carotid system: The left common carotid origin is widely patent. There is no common carotid or internal carotid artery dissection or aneurysm. No hemodynamically significant stenosis. Vertebral arteries: The vertebral system is left dominant. Both vertebral artery origins are normal. Both vertebral arteries are normal to their confluence with the basilar artery. Skeleton: There is no bony spinal canal stenosis. No lytic or blastic lesions. Other neck: The nasopharynx is clear. The oropharynx and hypopharynx are normal. The epiglottis is normal. The supraglottic larynx, glottis and subglottic larynx are normal. No retropharyngeal collection. The parapharyngeal spaces are preserved. The parotid and  submandibular glands are normal. No sialolithiasis or salivary ductal dilatation. The thyroid gland is normal. There is no cervical lymphadenopathy. Upper chest: No pneumothorax or pleural effusion. No nodules or masses. Review of the MIP images confirms the above findings CTA HEAD FINDINGS Anterior circulation: --Intracranial internal carotid arteries: Mild calcific atherosclerosis without hemodynamically significant stenosis. --Anterior cerebral arteries: Normal --Middle cerebral arteries: At the origin of the right middle cerebral artery, there is a posteriorly projecting aneurysm that measures 4 mm at its neck and 7 mm base to apex. At the right MCA bifurcation, there is an additional aneurysm that is oriented inferiorly and measures 3 mm at its base and 4 mm base to apex. --Posterior communicating arteries: Absent bilaterally. Posterior circulation: --Posterior cerebral arteries: Normal. --Superior cerebellar arteries: Normal. --Basilar artery: There is smooth tapering of the distal basilar artery which is narrowed to approximately 30% the caliber of the proximal basilar lumen. --Anterior inferior cerebellar arteries: Normal. --Posterior inferior cerebellar arteries: Normal. Venous sinuses: As permitted by contrast timing, patent. Anatomic variants: None Delayed phase: No parenchymal contrast enhancement. Review of the MIP images confirms the above findings IMPRESSION: 1. No emergent large vessel occlusion of the intracranial circulation. 2. 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 3. 3 x 4 mm inferiorly directed aneurysm arising from the bifurcation of the right middle cerebral artery. 4.  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 Both PCAs are of normal caliber. 5. No occlusion, dissection or hemodynamically significant stenosis of the cervical  arteries. Electronically Signed   By: Ulyses Jarred M.D.   On: 03/26/2017 22:51   Ct Head Wo Contrast  Result Date: 03/26/2017 CLINICAL DATA:  Intermittent headaches and vertigo since February. History of hypertension. EXAM: CT HEAD WITHOUT CONTRAST TECHNIQUE: Contiguous axial images were obtained from the base of the skull through the vertex without intravenous contrast. COMPARISON:  None. FINDINGS: BRAIN: No intraparenchymal hemorrhage, mass effect nor midline shift. The ventricles and sulci are normal for age. Mesial RIGHT occipital lobe early encephalomalacia. Old bilateral cerebellar infarcts. Patchy supratentorial white matter hypodensities within normal range for patient's age, though non-specific are most compatible with chronic small vessel ischemic disease. No acute large vascular territory infarcts. No abnormal extra-axial fluid collections. Basal cisterns are patent. VASCULAR: Moderate calcific atherosclerosis of the carotid siphons and to lesser extent intradural vertebral artery's. SKULL: No skull fracture. No significant scalp soft tissue swelling. SINUSES/ORBITS: The mastoid air-cells and included paranasal sinuses are well-aerated.The included ocular globes and orbital contents are non-suspicious. OTHER: None. IMPRESSION: Subacute to chronic RIGHT occipital lobe/ PCA territory infarct. Old bilateral cerebellar infarcts. Recommend MRA versus CTA to evaluate for posterior circulation abnormality. Mild chronic small vessel ischemic disease. Electronically Signed   By: Elon Alas M.D.   On: 03/26/2017 19:25   Ct Angio Neck W And/or Wo Contrast  Result Date: 03/26/2017 CLINICAL DATA:  Infarct identified on head CT EXAM: CT ANGIOGRAPHY HEAD AND NECK TECHNIQUE: Multidetector CT imaging of the head and neck was performed using the standard protocol during bolus administration of intravenous contrast. Multiplanar CT image reconstructions and MIPs were obtained to evaluate the vascular anatomy.  Carotid stenosis measurements (when applicable) are obtained utilizing NASCET criteria, using the distal internal carotid diameter as the denominator. CONTRAST:  50 mL Isovue 370 COMPARISON:  Head CT 03/26/2017 FINDINGS: CTA NECK FINDINGS Aortic arch: There is no aneurysm or dissection of the visualized ascending aorta or aortic arch. There is a normal 3 vessel branching pattern. The visualized proximal subclavian arteries are normal. Right carotid system: The right common carotid origin is widely patent. There is no common carotid or internal carotid artery dissection or aneurysm. No hemodynamically significant stenosis. Left carotid system: The left common carotid origin is widely patent. There is no common carotid or internal carotid artery dissection or aneurysm. No hemodynamically significant stenosis. Vertebral arteries: The vertebral system is left dominant. Both vertebral artery origins are normal. Both vertebral arteries are normal to their confluence with the basilar artery. Skeleton: There is no bony spinal canal stenosis. No lytic or blastic lesions. Other neck: The nasopharynx is clear. The oropharynx and hypopharynx are normal. The epiglottis is normal. The supraglottic larynx, glottis and subglottic larynx are normal. No retropharyngeal collection. The parapharyngeal spaces are preserved. The parotid and submandibular glands are normal. No sialolithiasis or salivary ductal dilatation. The thyroid gland is normal. There is no cervical lymphadenopathy. Upper chest: No pneumothorax or pleural effusion. No nodules or masses. Review of the MIP images confirms the above findings CTA HEAD FINDINGS Anterior circulation: --Intracranial internal carotid arteries: Mild calcific atherosclerosis without hemodynamically significant stenosis. --Anterior cerebral arteries: Normal --Middle cerebral arteries: At the origin of the right middle cerebral artery, there is a posteriorly projecting aneurysm that measures 4  mm at its neck and 7  mm base to apex. At the right MCA bifurcation, there is an additional aneurysm that is oriented inferiorly and measures 3 mm at its base and 4 mm base to apex. --Posterior communicating arteries: Absent bilaterally. Posterior circulation: --Posterior cerebral arteries: Normal. --Superior cerebellar arteries: Normal. --Basilar artery: There is smooth tapering of the distal basilar artery which is narrowed to approximately 30% the caliber of the proximal basilar lumen. --Anterior inferior cerebellar arteries: Normal. --Posterior inferior cerebellar arteries: Normal. Venous sinuses: As permitted by contrast timing, patent. Anatomic variants: None Delayed phase: No parenchymal contrast enhancement. Review of the MIP images confirms the above findings IMPRESSION: 1. No emergent large vessel occlusion of the intracranial circulation. 2. 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 3. 3 x 4 mm inferiorly directed aneurysm arising from the bifurcation of the right middle cerebral artery. 4. 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 Both PCAs are of normal caliber. 5. No occlusion, dissection or hemodynamically significant stenosis of the cervical arteries. Electronically Signed   By: Ulyses Jarred M.D.   On: 03/26/2017 22:51   Mr Jeri Cos WK Contrast  Result Date: 03/27/2017 CLINICAL DATA:  Stroke.  Right-sided headaches. EXAM: MRI HEAD WITHOUT AND WITH CONTRAST TECHNIQUE: Multiplanar, multiecho pulse sequences of the brain and surrounding structures were obtained without and with intravenous contrast. CONTRAST:  71mL MULTIHANCE GADOBENATE DIMEGLUMINE 529 MG/ML IV SOLN COMPARISON:  CTA head neck from yesterday FINDINGS: Brain: Right more than left occipital pole signal abnormality which is T2/FLAIR hyperintense and serpiginously enhancing.  There could be small superimposed infarcts, with ADC map difficult to interpret in this setting. Remote small infarcts in the bilateral cerebellum. 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 There is T1 hyperintensity along the cortex of the right occipital infarct, with minimal gradient signal hypointensity, likely cortical laminar necrosis. Remote lacunar infarcts in thalami. No acute hemorrhage, hydrocephalus, or mass. Scattered FLAIR hyperintensities in the cerebral white matter, nonspecific but likely microvascular ischemic. 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 Skull and upper cervical spine: No marrow signal abnormality. Sinuses/Orbits: Negative IMPRESSION: 1. Subacute infarcts in the bilateral occipital poles. Small more acute infarcts may be superimposed. Patient has other small remote posterior circulation infarcts. 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 2. Known cerebral aneurysms. Electronically Signed   By: Monte Fantasia M.D.   On: 03/27/2017 09:30        Scheduled Meds: . aspirin  300 mg Rectal Daily   Or  . aspirin  325 mg Oral Daily  . atorvastatin  40 mg Oral q1800  . enoxaparin (LOVENOX) injection  30 mg Subcutaneous Q24H   Continuous Infusions:   LOS: 1 day    Kindrick Lankford Tanna Furry, MD [facility] Pager [phone]  If 7PM-7AM, please contact night-coverage [email or web address] Password [email or web address] 03/27/2017, 2:22 PM

## 2017-03-27 NOTE — Progress Notes (Signed)
  Echocardiogram 2D Echocardiogram has been performed.  Tresa Res 03/27/2017, 2:10 PM

## 2017-03-27 NOTE — Evaluation (Signed)
Occupational Therapy Evaluation and Discharge Patient Details Name: Amy Howell MRN: [id number] DOB: 02-Jan-1956 Today's Date: 03/27/2017    History of Present Illness  Amy Howell is a 61 y.o. female with a past medical history significant for HTN and recurrent vertigo, who presents with 2 months headache.  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   Clinical Impression   Pt is close to baseline functioning and should be safe at home with available family assist. There are no further acute OT needs.  Will sign off at this time.    Follow Up Recommendations  No OT follow up    Equipment Recommendations  None recommended by OT    Recommendations for Other Services       Precautions / Restrictions Precautions Precautions: Fall Precaution Comments: history of vertigo Restrictions Weight Bearing Restrictions: No      Mobility Bed Mobility Overal bed mobility: Independent                Transfers Overall transfer level: Independent                    Balance Overall balance assessment: Needs assistance   Sitting balance-Leahy Scale: Normal       Standing balance-Leahy Scale: Good                   Standardized Balance Assessment Standardized Balance Assessment : Dynamic Gait Index   Dynamic Gait Index Level Surface: Normal Change in Gait Speed: Normal Gait with Horizontal Head Turns: Normal Gait with Vertical Head Turns: Normal Gait and Pivot Turn: Mild Impairment Step Over Obstacle: Normal Step Around Obstacles: Normal Steps: Normal Total Score: 23     ADL either performed or assessed with clinical judgement   ADL Overall ADL's : Independent                                       General ADL Comments: Pt able to perform toilet transfer, peri care, oral care, wash face with no LOB or assist (verbal  or physical) needed     Vision Baseline Vision/History: Wears glasses Wears Glasses: Reading only Patient Visual Report: Peripheral vision impairment;Other (comment) (Pt has been to eye doctor about her visual issues) Vision Assessment?: Yes Eye Alignment: Within Functional Limits Ocular Range of Motion: Within Functional Limits Alignment/Gaze Preference: Within Defined Limits Tracking/Visual Pursuits: Decreased smoothness of horizontal tracking (nystagmus when held to Pt's left) Convergence: Within functional limits Visual Fields: Left visual field deficit Additional Comments: Pt has been compensating for this visual deficit, and has seen an MD about it. She has already started implementing compensatory strategies     Perception     Praxis      Pertinent Vitals/Pain Pain Assessment: No/denies pain     Hand Dominance Right   Extremity/Trunk Assessment Upper Extremity Assessment Upper Extremity Assessment: Overall WFL for tasks assessed   Lower Extremity Assessment Lower Extremity Assessment: Overall WFL for tasks assessed   Cervical / Trunk Assessment Cervical / Trunk Assessment: Normal   Communication Communication Communication: No difficulties   Cognition Arousal/Alertness: Awake/alert Behavior During Therapy: WFL for tasks assessed/performed Overall Cognitive Status: Within Functional Limits for tasks assessed  General Comments  pt has visual deficits that do not appear to affect her safety in a home environment.    Exercises     Shoulder Instructions      Home Living Family/patient expects to be discharged to:: Private residence Living Arrangements: Children Available Help at Discharge: Available 24 hours/day (daughter works from home) Type of Home: Apartment (2nd floor apt) Home Access: Stairs to enter Entrance Stairs-Number of Steps: [place]: One level     Bathroom Shower/Tub: Community education officer: Standard Bathroom Accessibility: Yes How Accessible: Accessible via walker Home Equipment: None          Prior Functioning/Environment Level of Independence: Independent        Comments: does not drive, no DME, cooks         OT Problem List: Impaired vision/perception      OT Treatment/Interventions:      OT Goals(Current goals can be found in the care plan section) Acute Rehab OT Goals Patient Stated Goal: to get everything fixed and worked out so she can visit her cousin OT Goal Formulation: With patient Time For Goal Achievement: 04/10/17 Potential to Achieve Goals: Good  OT Frequency:     Barriers to D/C:            Co-evaluation PT/OT/SLP Co-Evaluation/Treatment: Yes Reason for Co-Treatment: To address functional/ADL transfers;For patient/therapist safety   OT goals addressed during session: ADL's and self-care      End of Session Nurse Communication: Mobility status (Pt ok to come off bed alarms and have independence in room)  Activity Tolerance: Patient tolerated treatment well Patient left: in bed;with call bell/phone within reach;with bed alarm set  OT Visit Diagnosis: Other symptoms and signs involving the nervous system (R29.898)                Time: 1610-9604 OT Time Calculation (min): 61 min Charges:  OT General Charges $OT Visit: 1 Procedure OT Evaluation $OT Eval Moderate Complexity: 1 Procedure OT Treatments $Self Care/Home Management : 8-22 mins G-Codes:     Amy Howell OTR/L Cooper 03/27/2017, 4:43 PM

## 2017-03-27 NOTE — Care Management Note (Signed)
Case Management Note  Patient Details  Name: Amy Howell MRN: [id number] Date of Birth: 02-09-1956  Subjective/Objective:  Pt admitted with CVA. She is from home with family.  No PCP or insurance listed.                  Action/Plan: Awaiting PT/OT recommendations. CM following for d/c needs, physician orders.  Expected Discharge Date:                  Expected Discharge Plan:     In-House Referral:     Discharge planning Services     Post Acute Care Choice:    Choice offered to:     DME Arranged:    DME Agency:     HH Arranged:    HH Agency:     Status of Service:  In process, will continue to follow  If discussed at Long Length of Stay Meetings, dates discussed:    Additional Comments:  Pollie Friar, RN 03/27/2017, 2:06 PM

## 2017-03-27 NOTE — Evaluation (Signed)
Physical Therapy Evaluation Patient Details Name: Amy Howell MRN: [id number] DOB: 04-14-1956 Today's Date: 03/27/2017   History of Present Illness   Amy Howell is a 61 y.o. female with a past medical history significant for HTN and recurrent vertigo, who presents with 2 months headache.  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  Clinical Impression  Pt is close to baseline functioning and should be safe at home with available family assist. There are no further acute PT needs.  Will sign off at this time.     Follow Up Recommendations No PT follow up    Equipment Recommendations  None recommended by PT    Recommendations for Other Services       Precautions / Restrictions Precautions Precautions: Fall Precaution Comments: history of vertigo Restrictions Weight Bearing Restrictions: No      Mobility  Bed Mobility Overal bed mobility: Independent                Transfers Overall transfer level: Independent                  Ambulation/Gait Ambulation/Gait assistance: Supervision (generally independent in a home-like environment) Ambulation Distance (Feet): 400 Feet   Gait Pattern/deviations: Step-through pattern   Gait velocity interpretation: >2.62 ft/sec, indicative of independent community ambulator General Gait Details: steady, compensates well for visual deficits in the periphery, L worse than R  Stairs Stairs: Yes Stairs assistance: Modified independent (Device/Increase time) Stair Management: No rails;One rail Right;Alternating pattern;Forwards Number of Stairs: 3 General stair comments: safe with rail  Wheelchair Mobility    Modified Rankin (Stroke Patients Only) Modified Rankin (Stroke Patients Only) Pre-Morbid Rankin Score: Moderate disability Modified Rankin: Moderate disability     Balance Overall balance  assessment: Needs assistance   Sitting balance-Leahy Scale: Normal       Standing balance-Leahy Scale: Good                   Standardized Balance Assessment Standardized Balance Assessment : Dynamic Gait Index   Dynamic Gait Index Level Surface: Normal Change in Gait Speed: Normal Gait with Horizontal Head Turns: Normal Gait with Vertical Head Turns: Normal Gait and Pivot Turn: Mild Impairment Step Over Obstacle: Normal Step Around Obstacles: Normal Steps: Normal Total Score: 23       Pertinent Vitals/Pain Pain Assessment: No/denies pain    Home Living Family/patient expects to be discharged to:: Private residence Living Arrangements: Children Available Help at Discharge: Available 24 hours/day (daughter works from home) Type of Home: Apartment (2nd floor apt) Home Access: Stairs to enter   Entrance Stairs-Number of Steps: 12 Home Layout: One level Home Equipment: None      Prior Function Level of Independence: Independent         Comments: does not drive, no DME, cooks      Hand Dominance   Dominant Hand: Right    Extremity/Trunk Assessment        Lower Extremity Assessment Lower Extremity Assessment: Overall WFL for tasks assessed       Communication   Communication: No difficulties  Cognition Arousal/Alertness: Awake/alert Behavior During Therapy: WFL for tasks assessed/performed Overall Cognitive Status: Within Functional Limits for tasks assessed  General Comments General comments (skin integrity, edema, etc.): pt has visual deficits that do not appear to affect her safety in a home environment.    Exercises     Assessment/Plan    PT Assessment Patent does not need any further PT services  PT Problem List         PT Treatment Interventions      PT Goals (Current goals can be found in the Care Plan section)  Acute Rehab PT Goals PT Goal Formulation: All assessment  and education complete, DC therapy    Frequency     Barriers to discharge        Co-evaluation               End of Session   Activity Tolerance: Patient tolerated treatment well Patient left: in bed;with call bell/phone within reach Nurse Communication: Mobility status PT Visit Diagnosis: Other abnormalities of gait and mobility (R26.89)    Time: 1518-1600 PT Time Calculation (min) (ACUTE ONLY): 42 min   Charges:   PT Evaluation $PT Eval Moderate Complexity: 1 Procedure PT Treatments $Gait Training: 8-22 mins   PT G Codes:        03/29/2017  [name], PT [phone]  (pager)  Tessie Fass Laquiesha Piacente 03-29-2017, 4:12 PM

## 2017-03-28 ENCOUNTER — Encounter (HOSPITAL_COMMUNITY)
Admission: EM | Disposition: A | Discharge status: Home / Self Care | Payer: Self-pay | Source: Home / Self Care | Attending: Nephrology

## 2017-03-28 ENCOUNTER — Encounter (HOSPITAL_COMMUNITY): Payer: Self-pay

## 2017-03-28 ENCOUNTER — Inpatient Hospital Stay (HOSPITAL_COMMUNITY): Payer: Self-pay

## 2017-03-28 DIAGNOSIS — I638 Other cerebral infarction: Secondary | ICD-10-CM

## 2017-03-28 DIAGNOSIS — I63433 Cerebral infarction due to embolism of bilateral posterior cerebral arteries: Secondary | ICD-10-CM

## 2017-03-28 DIAGNOSIS — I639 Cerebral infarction, unspecified: Secondary | ICD-10-CM

## 2017-03-28 HISTORY — PX: LOOP RECORDER INSERTION: EP1214

## 2017-03-28 HISTORY — PX: TEE WITHOUT CARDIOVERSION: SHX5443

## 2017-03-28 LAB — HEMOGLOBIN A1C
Hgb A1c MFr Bld: 5.8 % — ABNORMAL HIGH (ref 4.8–5.6)
Mean Plasma Glucose: 120 mg/dL

## 2017-03-28 LAB — HIV ANTIBODY (ROUTINE TESTING W REFLEX): HIV Screen 4th Generation wRfx: NONREACTIVE

## 2017-03-28 LAB — CK: CK TOTAL: 42 U/L (ref 38–234)

## 2017-03-28 SURGERY — LOOP RECORDER INSERTION

## 2017-03-28 SURGERY — ECHOCARDIOGRAM, TRANSESOPHAGEAL
Anesthesia: Moderate Sedation

## 2017-03-28 MED ORDER — MIDAZOLAM HCL 10 MG/2ML IJ SOLN
INTRAMUSCULAR | Status: DC | PRN
  Administered 2017-03-28: 1 mg via INTRAVENOUS
  Administered 2017-03-28 (×2): 2 mg via INTRAVENOUS

## 2017-03-28 MED ORDER — MIDAZOLAM HCL 5 MG/ML IJ SOLN
INTRAMUSCULAR | Status: AC
  Filled 2017-03-28: qty 2

## 2017-03-28 MED ORDER — LIDOCAINE-EPINEPHRINE 1 %-1:100000 IJ SOLN
INTRAMUSCULAR | Status: DC | PRN
  Administered 2017-03-28: 20 mL

## 2017-03-28 MED ORDER — LIDOCAINE-EPINEPHRINE 1 %-1:100000 IJ SOLN
INTRAMUSCULAR | Status: AC
  Filled 2017-03-28: qty 1

## 2017-03-28 MED ORDER — FENTANYL CITRATE (PF) 100 MCG/2ML IJ SOLN
INTRAMUSCULAR | Status: DC | PRN
  Administered 2017-03-28 (×2): 25 ug via INTRAVENOUS

## 2017-03-28 MED ORDER — SODIUM CHLORIDE 0.9 % IV SOLN
INTRAVENOUS | Status: DC
  Administered 2017-03-28: 500 mL via INTRAVENOUS

## 2017-03-28 MED ORDER — BUTAMBEN-TETRACAINE-BENZOCAINE 2-2-14 % EX AERO
INHALATION_SPRAY | CUTANEOUS | Status: DC | PRN
  Administered 2017-03-28: 2 via TOPICAL

## 2017-03-28 MED ORDER — FENTANYL CITRATE (PF) 100 MCG/2ML IJ SOLN
INTRAMUSCULAR | Status: AC
  Filled 2017-03-28: qty 2

## 2017-03-28 SURGICAL SUPPLY — 2 items
LOOP REVEAL LINQSYS (Prosthesis & Implant Heart) ×2 IMPLANT
PACK LOOP INSERTION (CUSTOM PROCEDURE TRAY) ×2 IMPLANT

## 2017-03-28 NOTE — H&P (View-Only) (Signed)
PROGRESS NOTE    Amy Howell  HGD:[id number] DOB: March 28, 1956 DOA: 03/26/2017 PCP: No PCP Per Patient   Brief Narrative: 61 year old female with history of hypertension presented with intermittent headache. Patient was seen by ophthalmologist for change in vision when there was concern for increased intracranial pressure therefore sent to the hospital for further evaluation. Patient was found to have a stroke.  Assessment & Plan:  #  Subacute right occipital lobe ischemic infarction: Evaluation ongoing by neurologist. -MRI of the brain with subacute infarction in the bilateral occipital poles. -2-D echo with EF of 65-70%. Plan for TEE and possible loop recorder placement by cardiologist. -Follow-up  vascular lower extremity Doppler -Continue aspirin and Lipitor -LDL 126, A1c 5.8 -PT OT therapy -Social worker evaluation  #Essential hypertension: Continue to monitor blood pressure. Norvasc on hold.  #Significant weight loss and possible severe protein calorie moderation: CT scan of chest abdomen and pelvis unremarkable for any malignancy.  # Cerebral aneurysms : Needs outpatient referral   Patient was recently moved from [place]  with her daughter. She was asking to talk to Education officer, museum. Social worker consulted. Continue current medical and supportive care. Follow-up neurology.  Principal Problem:   Stroke (cerebrum) (HCC) Active Problems:   Essential hypertension   Aneurysm of middle cerebral artery   Weight loss, unintentional   Basilar artery stenosis   Smoker  DVT prophylaxis: Lovenox subcutaneous Code Status: Full code Family Communication: No family at bedside Disposition Plan: Likely discharge home in 1-2 days    Consultants:   Neurology  Procedures: None Antimicrobials: None  Subjective: Patient was seen and examined at bedside. Denied headache, dizziness, nausea, vomiting, chest pain, shortness of breath.  Objective: Vitals:   03/27/17 2057 03/28/17  0045 03/28/17 0525 03/28/17 0935  BP: (!) 141/65 127/77 (!) 141/73 (!) 143/72  Pulse: 84 80 73 75  Resp: 16 16 16 16   Temp: 98.3 F (36.8 C) 98.1 F (36.7 C) 98.4 F (36.9 C) 98.1 F (36.7 C)  TempSrc: Oral Oral Oral Oral  SpO2: 100% 100% 100% 100%  Weight:      Height:       No intake or output data in the 24 hours ending 03/28/17 1252 Filed Weights   03/26/17 2339  Weight: 45.3 kg (99 lb 12.8 oz)    Examination:  General exam: Not in distress, lying in bed comfortable  Respiratory system: Clear bilateral. Respiratory effort normal. No wheezing or crackle Cardiovascular system: Regular rate rhythm, S1 is normal.  No pedal edema. Gastrointestinal system: Abdomen is nondistended, soft and nontender. Normal bowel sounds heard. Central nervous system: Alert and oriented. No focal neurological deficits. Extremities: Symmetric 5 x 5 power. Skin: No rashes, lesions or ulcers Psychiatry: Judgement and insight appear normal. Mood & affect appropriate.     Data Reviewed: I have personally reviewed following labs and imaging studies  CBC:  Recent Labs Lab 03/26/17 1856  WBC 6.6  NEUTROABS 4.2  HGB 12.6  HCT 39.0  MCV 93.8  PLT 962   Basic Metabolic Panel:  Recent Labs Lab 03/26/17 1856  NA 138  K 3.6  CL 101  CO2 29  GLUCOSE 176*  BUN 15  CREATININE 0.91  CALCIUM 9.9   GFR: Estimated Creatinine Clearance: 45.3 mL/min (by C-G formula based on SCr of 0.91 mg/dL). Liver Function Tests:  Recent Labs Lab 03/26/17 1856  AST 20  ALT 10*  ALKPHOS 81  BILITOT 0.4  PROT 7.8  ALBUMIN 4.1   No results  for input(s): LIPASE, AMYLASE in the last 168 hours. No results for input(s): AMMONIA in the last 168 hours. Coagulation Profile:  Recent Labs Lab 03/26/17 1856  INR 1.05   Cardiac Enzymes:  Recent Labs Lab 03/28/17 0453  CKTOTAL 42   BNP (last 3 results) No results for input(s): PROBNP in the last 8760 hours. HbA1C:  Recent Labs  03/27/17 1056    HGBA1C 5.8*   CBG: No results for input(s): GLUCAP in the last 168 hours. Lipid Profile:  Recent Labs  03/27/17 1056  CHOL 183  HDL 40*  LDLCALC 126*  TRIG 84  CHOLHDL 4.6   Thyroid Function Tests: No results for input(s): TSH, T4TOTAL, FREET4, T3FREE, THYROIDAB in the last 72 hours. Anemia Panel: No results for input(s): VITAMINB12, FOLATE, FERRITIN, TIBC, IRON, RETICCTPCT in the last 72 hours. Sepsis Labs: No results for input(s): PROCALCITON, LATICACIDVEN in the last 168 hours.  No results found for this or any previous visit (from the past 240 hour(s)).       Radiology Studies: Ct Angio Head W Or Wo Contrast  Result Date: 03/26/2017 CLINICAL DATA:  Infarct identified on head CT EXAM: CT ANGIOGRAPHY HEAD AND NECK TECHNIQUE: Multidetector CT imaging of the head and neck was performed using the standard protocol during bolus administration of intravenous contrast. Multiplanar CT image reconstructions and MIPs were obtained to evaluate the vascular anatomy. Carotid stenosis measurements (when applicable) are obtained utilizing NASCET criteria, using the distal internal carotid diameter as the denominator. CONTRAST:  50 mL Isovue 370 COMPARISON:  Head CT 03/26/2017 FINDINGS: CTA NECK FINDINGS Aortic arch: There is no aneurysm or dissection of the visualized ascending aorta or aortic arch. There is a normal 3 vessel branching pattern. The visualized proximal subclavian arteries are normal. Right carotid system: The right common carotid origin is widely patent. There is no common carotid or internal carotid artery dissection or aneurysm. No hemodynamically significant stenosis. Left carotid system: The left common carotid origin is widely patent. There is no common carotid or internal carotid artery dissection or aneurysm. No hemodynamically significant stenosis. Vertebral arteries: The vertebral system is left dominant. Both vertebral artery origins are normal. Both vertebral arteries  are normal to their confluence with the basilar artery. Skeleton: There is no bony spinal canal stenosis. No lytic or blastic lesions. Other neck: The nasopharynx is clear. The oropharynx and hypopharynx are normal. The epiglottis is normal. The supraglottic larynx, glottis and subglottic larynx are normal. No retropharyngeal collection. The parapharyngeal spaces are preserved. The parotid and submandibular glands are normal. No sialolithiasis or salivary ductal dilatation. The thyroid gland is normal. There is no cervical lymphadenopathy. Upper chest: No pneumothorax or pleural effusion. No nodules or masses. Review of the MIP images confirms the above findings CTA HEAD FINDINGS Anterior circulation: --Intracranial internal carotid arteries: Mild calcific atherosclerosis without hemodynamically significant stenosis. --Anterior cerebral arteries: Normal --Middle cerebral arteries: At the origin of the right middle cerebral artery, there is a posteriorly projecting aneurysm that measures 4 mm at its neck and 7 mm base to apex. At the right MCA bifurcation, there is an additional aneurysm that is oriented inferiorly and measures 3 mm at its base and 4 mm base to apex. --Posterior communicating arteries: Absent bilaterally. Posterior circulation: --Posterior cerebral arteries: Normal. --Superior cerebellar arteries: Normal. --Basilar artery: There is smooth tapering of the distal basilar artery which is narrowed to approximately 30% the caliber of the proximal basilar lumen. --Anterior inferior cerebellar arteries: Normal. --Posterior inferior cerebellar arteries: Normal.  Venous sinuses: As permitted by contrast timing, patent. Anatomic variants: None Delayed phase: No parenchymal contrast enhancement. Review of the MIP images confirms the above findings IMPRESSION: 1. No emergent large vessel occlusion of the intracranial circulation. 2. 7 x 4 mm aneurysm arising posteriorly from the proximal M1 segment of the right  middle cerebral artery. 3. 3 x 4 mm inferiorly directed aneurysm arising from the bifurcation of the right middle cerebral artery. 4. 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 Both PCAs are of normal caliber. 5. No occlusion, dissection or hemodynamically significant stenosis of the cervical arteries. Electronically Signed   By: Ulyses Jarred M.D.   On: 03/26/2017 22:51   Ct Head Wo Contrast  Result Date: 03/26/2017 CLINICAL DATA:  Intermittent headaches and vertigo since February. History of hypertension. EXAM: CT HEAD WITHOUT CONTRAST TECHNIQUE: Contiguous axial images were obtained from the base of the skull through the vertex without intravenous contrast. COMPARISON:  None. FINDINGS: BRAIN: No intraparenchymal hemorrhage, mass effect nor midline shift. The ventricles and sulci are normal for age. Mesial RIGHT occipital lobe early encephalomalacia. Old bilateral cerebellar infarcts. Patchy supratentorial white matter hypodensities within normal range for patient's age, though non-specific are most compatible with chronic small vessel ischemic disease. No acute large vascular territory infarcts. No abnormal extra-axial fluid collections. Basal cisterns are patent. VASCULAR: Moderate calcific atherosclerosis of the carotid siphons and to lesser extent intradural vertebral artery's. SKULL: No skull fracture. No significant scalp soft tissue swelling. SINUSES/ORBITS: The mastoid air-cells and included paranasal sinuses are well-aerated.The included ocular globes and orbital contents are non-suspicious. OTHER: None. IMPRESSION: Subacute to chronic RIGHT occipital lobe/ PCA territory infarct. Old bilateral cerebellar infarcts. Recommend MRA versus CTA to evaluate for posterior circulation abnormality. Mild chronic small vessel ischemic disease. Electronically Signed   By: Elon Alas M.D.   On: 03/26/2017 19:25    Ct Angio Neck W And/or Wo Contrast  Result Date: 03/26/2017 CLINICAL DATA:  Infarct identified on head CT EXAM: CT ANGIOGRAPHY HEAD AND NECK TECHNIQUE: Multidetector CT imaging of the head and neck was performed using the standard protocol during bolus administration of intravenous contrast. Multiplanar CT image reconstructions and MIPs were obtained to evaluate the vascular anatomy. Carotid stenosis measurements (when applicable) are obtained utilizing NASCET criteria, using the distal internal carotid diameter as the denominator. CONTRAST:  50 mL Isovue 370 COMPARISON:  Head CT 03/26/2017 FINDINGS: CTA NECK FINDINGS Aortic arch: There is no aneurysm or dissection of the visualized ascending aorta or aortic arch. There is a normal 3 vessel branching pattern. The visualized proximal subclavian arteries are normal. Right carotid system: The right common carotid origin is widely patent. There is no common carotid or internal carotid artery dissection or aneurysm. No hemodynamically significant stenosis. Left carotid system: The left common carotid origin is widely patent. There is no common carotid or internal carotid artery dissection or aneurysm. No hemodynamically significant stenosis. Vertebral arteries: The vertebral system is left dominant. Both vertebral artery origins are normal. Both vertebral arteries are normal to their confluence with the basilar artery. Skeleton: There is no bony spinal canal stenosis. No lytic or blastic lesions. Other neck: The nasopharynx is clear. The oropharynx and hypopharynx are normal. The epiglottis is normal. The supraglottic larynx, glottis and subglottic larynx are normal. No retropharyngeal collection. The parapharyngeal spaces are preserved. The parotid and submandibular glands are normal. No sialolithiasis or salivary ductal dilatation. The thyroid gland  is normal. There is no cervical lymphadenopathy. Upper chest: No pneumothorax or pleural effusion. No nodules or  masses. Review of the MIP images confirms the above findings CTA HEAD FINDINGS Anterior circulation: --Intracranial internal carotid arteries: Mild calcific atherosclerosis without hemodynamically significant stenosis. --Anterior cerebral arteries: Normal --Middle cerebral arteries: At the origin of the right middle cerebral artery, there is a posteriorly projecting aneurysm that measures 4 mm at its neck and 7 mm base to apex. At the right MCA bifurcation, there is an additional aneurysm that is oriented inferiorly and measures 3 mm at its base and 4 mm base to apex. --Posterior communicating arteries: Absent bilaterally. Posterior circulation: --Posterior cerebral arteries: Normal. --Superior cerebellar arteries: Normal. --Basilar artery: There is smooth tapering of the distal basilar artery which is narrowed to approximately 30% the caliber of the proximal basilar lumen. --Anterior inferior cerebellar arteries: Normal. --Posterior inferior cerebellar arteries: Normal. Venous sinuses: As permitted by contrast timing, patent. Anatomic variants: None Delayed phase: No parenchymal contrast enhancement. Review of the MIP images confirms the above findings IMPRESSION: 1. No emergent large vessel occlusion of the intracranial circulation. 2. 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 3. 3 x 4 mm inferiorly directed aneurysm arising from the bifurcation of the right middle cerebral artery. 4. 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 Both PCAs are of normal caliber. 5. No occlusion, dissection or hemodynamically significant stenosis of the cervical arteries. Electronically Signed   By: Ulyses Jarred M.D.   On: 03/26/2017 22:51   Ct Chest W Contrast  Result Date: 03/28/2017 CLINICAL DATA:  Stroke, weight loss and hypertension EXAM: CT CHEST, ABDOMEN, AND  PELVIS WITH CONTRAST TECHNIQUE: Multidetector CT imaging of the chest, abdomen and pelvis was performed following the standard protocol during bolus administration of intravenous contrast. CONTRAST:  83mL ISOVUE-300 IOPAMIDOL (ISOVUE-300) INJECTION 61% COMPARISON:  None. FINDINGS: CT CHEST FINDINGS Cardiovascular: Normal size cardiac chambers with tiny anterior 7 mm thick pericardial effusion. No aortic aneurysm. There is aortic atherosclerosis without dissection. There is three-vessel coronary arteriosclerosis. Atherosclerosis along the proximal great vessels with normal branch pattern from the arch. Mediastinum/Nodes: No supraclavicular, axillary, mediastinal nor hilar adenopathy. Small amount of pericardial fluid simulates a mildly enlarged AP window lymph node. The mainstem bronchi and trachea are patent. Esophagus is nonacute. No thyroid mass. Lungs/Pleura: Dependent atelectasis at the lung bases. No pneumonic consolidation. Musculoskeletal: No chest wall mass or suspicious bone lesions identified. CT ABDOMEN PELVIS FINDINGS Hepatobiliary: No focal liver abnormality is seen. No gallstones, gallbladder wall thickening, or biliary dilatation. Pancreas: Unremarkable. No pancreatic ductal dilatation or surrounding inflammatory changes. Spleen: Normal in size without focal abnormality. Adrenals/Urinary Tract: Adrenal glands are unremarkable. Kidneys are normal, without renal calculi, focal lesion, or hydronephrosis. Bladder is unremarkable. Stomach/Bowel: Stomach is within normal limits. Appendix appears normal. No evidence of bowel wall thickening, distention, or inflammatory changes. Increased colonic stool burden within large bowel. Sigmoid and descending diverticulosis without acute diverticulitis. No annular constricting lesions are apparent. Vascular/Lymphatic: Aortoiliac atherosclerosis. Patent celiac axis, SMA, portal and splenic veins. No lymphadenopathy. Reproductive: Status post hysterectomy. No adnexal  masses. Other: No abdominal wall hernia or abnormality. No abdominopelvic ascites. Musculoskeletal: No acute or significant osseous findings. IMPRESSION: 1. No CT findings to explain the patient's weight loss. 2. No acute cardiothoracic, abdominal or pelvic findings. 3. Three-vessel coronary arteriosclerosis. 4. Moderate colonic stool  burden with colonic diverticulosis. No acute bowel inflammation. Normal appendix. Electronically Signed   By: Ashley Royalty M.D.   On: 03/28/2017 00:39   Mr Jeri Cos CV Contrast  Result Date: 03/27/2017 CLINICAL DATA:  Stroke.  Right-sided headaches. EXAM: MRI HEAD WITHOUT AND WITH CONTRAST TECHNIQUE: Multiplanar, multiecho pulse sequences of the brain and surrounding structures were obtained without and with intravenous contrast. CONTRAST:  56mL MULTIHANCE GADOBENATE DIMEGLUMINE 529 MG/ML IV SOLN COMPARISON:  CTA head neck from yesterday FINDINGS: Brain: Right more than left occipital pole signal abnormality which is T2/FLAIR hyperintense and serpiginously enhancing. There could be small superimposed infarcts, with ADC map difficult to interpret in this setting. Remote small infarcts in the bilateral cerebellum. 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 There is T1 hyperintensity along the cortex of the right occipital infarct, with minimal gradient signal hypointensity, likely cortical laminar necrosis. Remote lacunar infarcts in thalami. No acute hemorrhage, hydrocephalus, or mass. Scattered FLAIR hyperintensities in the cerebral white matter, nonspecific but likely microvascular ischemic. 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 Skull and upper cervical spine: No marrow signal abnormality. Sinuses/Orbits: Negative IMPRESSION: 1. Subacute infarcts in the bilateral occipital poles. Small more acute infarcts may be superimposed. Patient has other small remote posterior circulation infarcts. Known basilar stenosis at an  irregular plaque by CTA yesterday. 2. Known cerebral aneurysms. Electronically Signed   By: Monte Fantasia M.D.   On: 03/27/2017 09:30   Ct Abdomen Pelvis W Contrast  Result Date: 03/28/2017 CLINICAL DATA:  Stroke, weight loss and hypertension EXAM: CT CHEST, ABDOMEN, AND PELVIS WITH CONTRAST TECHNIQUE: Multidetector CT imaging of the chest, abdomen and pelvis was performed following the standard protocol during bolus administration of intravenous contrast. CONTRAST:  79mL ISOVUE-300 IOPAMIDOL (ISOVUE-300) INJECTION 61% COMPARISON:  None. FINDINGS: CT CHEST FINDINGS Cardiovascular: Normal size cardiac chambers with tiny anterior 7 mm thick pericardial effusion. No aortic aneurysm. There is aortic atherosclerosis without dissection. There is three-vessel coronary arteriosclerosis. Atherosclerosis along the proximal great vessels with normal branch pattern from the arch. Mediastinum/Nodes: No supraclavicular, axillary, mediastinal nor hilar adenopathy. Small amount of pericardial fluid simulates a mildly enlarged AP window lymph node. The mainstem bronchi and trachea are patent. Esophagus is nonacute. No thyroid mass. Lungs/Pleura: Dependent atelectasis at the lung bases. No pneumonic consolidation. Musculoskeletal: No chest wall mass or suspicious bone lesions identified. CT ABDOMEN PELVIS FINDINGS Hepatobiliary: No focal liver abnormality is seen. No gallstones, gallbladder wall thickening, or biliary dilatation. Pancreas: Unremarkable. No pancreatic ductal dilatation or surrounding inflammatory changes. Spleen: Normal in size without focal abnormality. Adrenals/Urinary Tract: Adrenal glands are unremarkable. Kidneys are normal, without renal calculi, focal lesion, or hydronephrosis. Bladder is unremarkable. Stomach/Bowel: Stomach is within normal limits. Appendix appears normal. No evidence of bowel wall thickening, distention, or inflammatory changes. Increased colonic stool burden within large bowel. Sigmoid  and descending diverticulosis without acute diverticulitis. No annular constricting lesions are apparent. Vascular/Lymphatic: Aortoiliac atherosclerosis. Patent celiac axis, SMA, portal and splenic veins. No lymphadenopathy. Reproductive: Status post hysterectomy. No adnexal masses. Other: No abdominal wall hernia or abnormality. No abdominopelvic ascites. Musculoskeletal: No acute or significant osseous findings. IMPRESSION: 1. No CT findings to explain the patient's weight loss. 2. No acute cardiothoracic, abdominal or pelvic findings. 3. Three-vessel coronary arteriosclerosis. 4. Moderate colonic stool burden with colonic diverticulosis. No acute bowel inflammation. Normal appendix. Electronically Signed   By: Ashley Royalty M.D.   On: 03/28/2017 00:39  Scheduled Meds: . aspirin  300 mg Rectal Daily   Or  . aspirin  325 mg Oral Daily  . atorvastatin  40 mg Oral q1800  . enoxaparin (LOVENOX) injection  30 mg Subcutaneous Q24H   Continuous Infusions:   LOS: 2 days    Deisy Ozbun Tanna Furry, MD [facility] Pager [phone]  If 7PM-7AM, please contact night-coverage [email or web address] Password [facility] 03/28/2017, 12:52 PM

## 2017-03-28 NOTE — CV Procedure (Signed)
    Transesophageal Echocardiogram Note  Amy Howell [id number] Dec 14, 1955  Procedure: Transesophageal Echocardiogram Indications: CVA  Procedure Details Consent: Obtained Time Out: Verified patient identification, verified procedure, site/side was marked, verified correct patient position, special equipment/implants available, Radiology Safety Procedures followed,  medications/allergies/relevent history reviewed, required imaging and test results available.  Performed  Medications:  During this procedure the patient is administered a total of Versed 5 mg and Fentanyl 50 mcg  to achieve and maintain moderate conscious sedation.  The patient's heart rate, blood pressure, and oxygen saturation are monitored continuously during the procedure. The period of conscious sedation is 30 minutes, of which I was present face-to-face 100% of this time.  Vigorous LV systolic function; LVH; mild AI; no LAA thrombus; negative saline microcavitation study.   Complications: No apparent complications Patient did tolerate procedure well.  Kirk Ruths, MD

## 2017-03-28 NOTE — Evaluation (Signed)
Speech Language Pathology Evaluation Patient Details Name: Amy Howell MRN: [id number] DOB: March 15, 1956 Today's Date: 03/28/2017 Time: 4098-1191 SLP Time Calculation (min) (ACUTE ONLY): 23 min  Problem List:  Patient Active Problem List   Diagnosis Date Noted  . Aneurysm of middle cerebral artery   . Weight loss, unintentional   . Basilar artery stenosis   . Smoker   . 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 Essential hypertension 03/26/2017   Past Medical History:  Past Medical History:  Diagnosis Date  . Hypertension   . Vertigo    Past Surgical History:  Past Surgical History:  Procedure Laterality Date  . ABDOMINAL HYSTERECTOMY    . gum operation     HPI:  61 y.o. female with a past medical history significant for HTN and recurrent vertigo, who presents with 2 months headache.  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   Assessment / Plan / Recommendation Clinical Impression  Pt scored a 15/22 on the MoCA-Blind (score of 18+ indicative of normal range). She had significant difficulty with delayed recall and calculations. She reports that this is changed from her baseline. Recommend additional SLP f/u to maximize higher level cognitive skills and increase independence.    SLP Assessment  SLP Recommendation/Assessment: Patient needs continued Speech Lanaguage Pathology Services SLP Visit Diagnosis: Cognitive communication deficit (R41.841)    Follow Up Recommendations  Outpatient SLP    Frequency and Duration min 1 x/week  1 week      SLP Evaluation Cognition  Overall Cognitive Status: Impaired/Different from baseline Arousal/Alertness: Awake/alert Orientation Level: Oriented X4 Attention: Sustained Sustained Attention: Appears intact Memory: Impaired Memory Impairment: Decreased recall of new information Awareness: Appears intact Problem  Solving: Impaired Problem Solving Impairment: Verbal complex Safety/Judgment: Appears intact       Comprehension  Auditory Comprehension Overall Auditory Comprehension: Appears within functional limits for tasks assessed    Expression Expression Primary Mode of Expression: Verbal Verbal Expression Overall Verbal Expression: Appears within functional limits for tasks assessed   Oral / Motor  Motor Speech Overall Motor Speech: Appears within functional limits for tasks assessed   GO                    Germain Osgood 03/28/2017, 12:32 PM  Germain Osgood, M.A. CCC-SLP [phone]

## 2017-03-28 NOTE — Interval H&P Note (Signed)
History and Physical Interval Note:  03/28/2017 2:26 PM  Amy Howell  has presented today for surgery, with the diagnosis of STOKE  The various methods of treatment have been discussed with the patient and family. After consideration of risks, benefits and other options for treatment, the patient has consented to  Procedure(s): TRANSESOPHAGEAL ECHOCARDIOGRAM (TEE) (N/A) as a surgical intervention .  The patient's history has been reviewed, patient examined, no change in status, stable for surgery.  I have reviewed the patient's chart and labs.  Questions were answered to the patient's satisfaction.     Kirk Ruths

## 2017-03-28 NOTE — Consult Note (Signed)
ELECTROPHYSIOLOGY CONSULT NOTE  Patient ID: Amy Howell MRN: [id number], DOB/AGE: 1956-05-27   Admit date: 03/26/2017 Date of Consult: 03/28/2017  Primary Physician: No PCP Per Patient Primary Cardiologist: new to [facility] Reason for Consultation: Cryptogenic stroke; recommendations regarding Implantable Loop Recorder  History of Present Illness Rhyen Lunde we have been asked to see by Dr Erlinda Hong to evaluate for implantable loop recorder placement after cryptogenic stroke. She was admitted on 03/26/2017 with visual field defect.  They first developed symptoms in February which have been intermittent.  Imaging demonstrated bilateral occipital infarcts felt to be embolic 2/2 unknown source.  She  has undergone workup for stroke including echocardiogram and carotid dopplers.  The patient has been monitored on telemetry which has demonstrated sinus rhythm with no arrhythmias.  Inpatient stroke work-up is to be completed with a TEE.   Echocardiogram this admission demonstrated EF 65-70%, no RWMA, LA 27.  Lab work is reviewed.  Prior to admission, the patient denies chest pain, shortness of breath, dizziness, palpitations, or syncope.  They are recovering from their stroke with plans to return home at discharge.  EP has been asked to evaluate for placement of an implantable loop recorder to monitor for atrial fibrillation.  Past Medical History:  Diagnosis Date  . Hypertension   . Vertigo      Surgical History:  Past Surgical History:  Procedure Laterality Date  . ABDOMINAL HYSTERECTOMY    . gum operation       Prescriptions Prior to Admission  Medication Sig Dispense Refill Last Dose  . amLODipine (NORVASC) 10 MG tablet Take 1 tablet (10 mg total) by mouth daily. 30 tablet 1 03/26/2017 at Unknown time  . meclizine (ANTIVERT) 25 MG tablet Take 0.5-1 tablets (12.5-25 mg total) by mouth 3 (three) times daily as needed for dizziness or nausea. 30 tablet 0 unk  . meloxicam (MOBIC) 7.5 MG  tablet 1 bid prn pain 60 tablet 0 Past Week at Unknown time  . oxyCODONE-acetaminophen (PERCOCET) 5-325 MG tablet Take 1 tablet by mouth every 6 (six) hours as needed for severe pain. 10 tablet 0 Past Month at Unknown time    Inpatient Medications:  . aspirin  300 mg Rectal Daily   Or  . aspirin  325 mg Oral Daily  . atorvastatin  40 mg Oral q1800  . enoxaparin (LOVENOX) injection  30 mg Subcutaneous Q24H    Allergies: No Known Allergies  Social History   Social History  . Marital status: Single    Spouse name: N/A  . Number of children: N/A  . Years of education: N/A   Occupational History  . Not on file.   Social History Main Topics  . Smoking status: Current Every Day Smoker  . Smokeless tobacco: Never Used  . Alcohol use No  . Drug use: Unknown  . Sexual activity: Not on file   Other Topics Concern  . Not on file   Social History Narrative  . No narrative on file     Family History  Problem Relation Age of Onset  . Hypertension Father   . Diabetes Father       Review of Systems: All other systems reviewed and are otherwise negative except as noted above.  Physical Exam: Vitals:   03/27/17 2057 03/28/17 0045 03/28/17 0525 03/28/17 0935  BP: (!) 141/65 127/77 (!) 141/73 (!) 143/72  Pulse: 84 80 73 75  Resp: 16 16 16 16   Temp: 98.3 F (36.8 C) 98.1 F (36.7 C)  98.4 F (36.9 C) 98.1 F (36.7 C)  TempSrc: Oral Oral Oral Oral  SpO2: 100% 100% 100% 100%  Weight:      Height:        GEN- The patient is elderly and thin appearing, alert and oriented x 3 today.   Head- normocephalic, atraumatic Eyes-  Sclera clear, conjunctiva pink Ears- hearing intact Oropharynx- clear Neck- supple Lungs- Clear to ausculation bilaterally, normal work of breathing Heart- Regular rate and rhythm   GI- soft, NT, ND, + BS Extremities- no clubbing, cyanosis, or edema MS- no significant deformity or atrophy Skin- no rash or lesion Psych- euthymic mood, full  affect   Labs:   Lab Results  Component Value Date   WBC 6.6 03/26/2017   HGB 12.6 03/26/2017   HCT 39.0 03/26/2017   MCV 93.8 03/26/2017   PLT 161 03/26/2017    Recent Labs Lab 03/26/17 1856  NA 138  K 3.6  CL 101  CO2 29  BUN 15  CREATININE 0.91  CALCIUM 9.9  PROT 7.8  BILITOT 0.4  ALKPHOS 81  ALT 10*  AST 20  GLUCOSE 176*   Lab Results  Component Value Date   CKTOTAL 42 03/28/2017   Lab Results  Component Value Date   CHOL 183 03/27/2017   Lab Results  Component Value Date   HDL 40 (L) 03/27/2017   Lab Results  Component Value Date   LDLCALC 126 (H) 03/27/2017   Lab Results  Component Value Date   TRIG 84 03/27/2017   Lab Results  Component Value Date   CHOLHDL 4.6 03/27/2017   No results found for: LDLDIRECT  No results found for: DDIMER   Radiology/Studies: Ct Angio Head W Or Wo Contrast  Result Date: 03/26/2017 CLINICAL DATA:  Infarct identified on head CT EXAM: CT ANGIOGRAPHY HEAD AND NECK TECHNIQUE: Multidetector CT imaging of the head and neck was performed using the standard protocol during bolus administration of intravenous contrast. Multiplanar CT image reconstructions and MIPs were obtained to evaluate the vascular anatomy. Carotid stenosis measurements (when applicable) are obtained utilizing NASCET criteria, using the distal internal carotid diameter as the denominator. CONTRAST:  50 mL Isovue 370 COMPARISON:  Head CT 03/26/2017 FINDINGS: CTA NECK FINDINGS Aortic arch: There is no aneurysm or dissection of the visualized ascending aorta or aortic arch. There is a normal 3 vessel branching pattern. The visualized proximal subclavian arteries are normal. Right carotid system: The right common carotid origin is widely patent. There is no common carotid or internal carotid artery dissection or aneurysm. No hemodynamically significant stenosis. Left carotid system: The left common carotid origin is widely patent. There is no common carotid or  internal carotid artery dissection or aneurysm. No hemodynamically significant stenosis. Vertebral arteries: The vertebral system is left dominant. Both vertebral artery origins are normal. Both vertebral arteries are normal to their confluence with the basilar artery. Skeleton: There is no bony spinal canal stenosis. No lytic or blastic lesions. Other neck: The nasopharynx is clear. The oropharynx and hypopharynx are normal. The epiglottis is normal. The supraglottic larynx, glottis and subglottic larynx are normal. No retropharyngeal collection. The parapharyngeal spaces are preserved. The parotid and submandibular glands are normal. No sialolithiasis or salivary ductal dilatation. The thyroid gland is normal. There is no cervical lymphadenopathy. Upper chest: No pneumothorax or pleural effusion. No nodules or masses. Review of the MIP images confirms the above findings CTA HEAD FINDINGS Anterior circulation: --Intracranial internal carotid arteries: Mild calcific atherosclerosis without hemodynamically significant  stenosis. --Anterior cerebral arteries: Normal --Middle cerebral arteries: At the origin of the right middle cerebral artery, there is a posteriorly projecting aneurysm that measures 4 mm at its neck and 7 mm base to apex. At the right MCA bifurcation, there is an additional aneurysm that is oriented inferiorly and measures 3 mm at its base and 4 mm base to apex. --Posterior communicating arteries: Absent bilaterally. Posterior circulation: --Posterior cerebral arteries: Normal. --Superior cerebellar arteries: Normal. --Basilar artery: There is smooth tapering of the distal basilar artery which is narrowed to approximately 30% the caliber of the proximal basilar lumen. --Anterior inferior cerebellar arteries: Normal. --Posterior inferior cerebellar arteries: Normal. Venous sinuses: As permitted by contrast timing, patent. Anatomic variants: None Delayed phase: No parenchymal contrast enhancement. Review  of the MIP images confirms the above findings IMPRESSION: 1. No emergent large vessel occlusion of the intracranial circulation. 2. 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 3. 3 x 4 mm inferiorly directed aneurysm arising from the bifurcation of the right middle cerebral artery. 4. 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 Both PCAs are of normal caliber. 5. No occlusion, dissection or hemodynamically significant stenosis of the cervical arteries. Electronically Signed   By: Ulyses Jarred M.D.   On: 03/26/2017 22:51   Ct Head Wo Contrast  Result Date: 03/26/2017 CLINICAL DATA:  Intermittent headaches and vertigo since February. History of hypertension. EXAM: CT HEAD WITHOUT CONTRAST TECHNIQUE: Contiguous axial images were obtained from the base of the skull through the vertex without intravenous contrast. COMPARISON:  None. FINDINGS: BRAIN: No intraparenchymal hemorrhage, mass effect nor midline shift. The ventricles and sulci are normal for age. Mesial RIGHT occipital lobe early encephalomalacia. Old bilateral cerebellar infarcts. Patchy supratentorial white matter hypodensities within normal range for patient's age, though non-specific are most compatible with chronic small vessel ischemic disease. No acute large vascular territory infarcts. No abnormal extra-axial fluid collections. Basal cisterns are patent. VASCULAR: Moderate calcific atherosclerosis of the carotid siphons and to lesser extent intradural vertebral artery's. SKULL: No skull fracture. No significant scalp soft tissue swelling. SINUSES/ORBITS: The mastoid air-cells and included paranasal sinuses are well-aerated.The included ocular globes and orbital contents are non-suspicious. OTHER: None. IMPRESSION: Subacute to chronic RIGHT occipital lobe/ PCA territory infarct. Old bilateral  cerebellar infarcts. Recommend MRA versus CTA to evaluate for posterior circulation abnormality. Mild chronic small vessel ischemic disease. Electronically Signed   By: Elon Alas M.D.   On: 03/26/2017 19:25   Ct Angio Neck W And/or Wo Contrast  Result Date: 03/26/2017 CLINICAL DATA:  Infarct identified on head CT EXAM: CT ANGIOGRAPHY HEAD AND NECK TECHNIQUE: Multidetector CT imaging of the head and neck was performed using the standard protocol during bolus administration of intravenous contrast. Multiplanar CT image reconstructions and MIPs were obtained to evaluate the vascular anatomy. Carotid stenosis measurements (when applicable) are obtained utilizing NASCET criteria, using the distal internal carotid diameter as the denominator. CONTRAST:  50 mL Isovue 370 COMPARISON:  Head CT 03/26/2017 FINDINGS: CTA NECK FINDINGS Aortic arch: There is no aneurysm or dissection of the visualized ascending aorta or aortic arch. There is a normal 3 vessel branching pattern. The visualized proximal subclavian arteries are normal. Right carotid system: The right common carotid origin is widely patent. There is no common carotid or internal carotid artery dissection or aneurysm. No hemodynamically significant stenosis. Left carotid system:  The left common carotid origin is widely patent. There is no common carotid or internal carotid artery dissection or aneurysm. No hemodynamically significant stenosis. Vertebral arteries: The vertebral system is left dominant. Both vertebral artery origins are normal. Both vertebral arteries are normal to their confluence with the basilar artery. Skeleton: There is no bony spinal canal stenosis. No lytic or blastic lesions. Other neck: The nasopharynx is clear. The oropharynx and hypopharynx are normal. The epiglottis is normal. The supraglottic larynx, glottis and subglottic larynx are normal. No retropharyngeal collection. The parapharyngeal spaces are preserved. The parotid and  submandibular glands are normal. No sialolithiasis or salivary ductal dilatation. The thyroid gland is normal. There is no cervical lymphadenopathy. Upper chest: No pneumothorax or pleural effusion. No nodules or masses. Review of the MIP images confirms the above findings CTA HEAD FINDINGS Anterior circulation: --Intracranial internal carotid arteries: Mild calcific atherosclerosis without hemodynamically significant stenosis. --Anterior cerebral arteries: Normal --Middle cerebral arteries: At the origin of the right middle cerebral artery, there is a posteriorly projecting aneurysm that measures 4 mm at its neck and 7 mm base to apex. At the right MCA bifurcation, there is an additional aneurysm that is oriented inferiorly and measures 3 mm at its base and 4 mm base to apex. --Posterior communicating arteries: Absent bilaterally. Posterior circulation: --Posterior cerebral arteries: Normal. --Superior cerebellar arteries: Normal. --Basilar artery: There is smooth tapering of the distal basilar artery which is narrowed to approximately 30% the caliber of the proximal basilar lumen. --Anterior inferior cerebellar arteries: Normal. --Posterior inferior cerebellar arteries: Normal. Venous sinuses: As permitted by contrast timing, patent. Anatomic variants: None Delayed phase: No parenchymal contrast enhancement. Review of the MIP images confirms the above findings IMPRESSION: 1. No emergent large vessel occlusion of the intracranial circulation. 2. 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 3. 3 x 4 mm inferiorly directed aneurysm arising from the bifurcation of the right middle cerebral artery. 4. 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 Both PCAs are of normal caliber. 5. No occlusion, dissection or hemodynamically significant stenosis of the cervical  arteries. Electronically Signed   By: Ulyses Jarred M.D.   On: 03/26/2017 22:51   Ct Chest W Contrast  Result Date: 03/28/2017 CLINICAL DATA:  Stroke, weight loss and hypertension EXAM: CT CHEST, ABDOMEN, AND PELVIS WITH CONTRAST TECHNIQUE: Multidetector CT imaging of the chest, abdomen and pelvis was performed following the standard protocol during bolus administration of intravenous contrast. CONTRAST:  44mL ISOVUE-300 IOPAMIDOL (ISOVUE-300) INJECTION 61% COMPARISON:  None. FINDINGS: CT CHEST FINDINGS Cardiovascular: Normal size cardiac chambers with tiny anterior 7 mm thick pericardial effusion. No aortic aneurysm. There is aortic atherosclerosis without dissection. There is three-vessel coronary arteriosclerosis. Atherosclerosis along the proximal great vessels with normal branch pattern from the arch. Mediastinum/Nodes: No supraclavicular, axillary, mediastinal nor hilar adenopathy. Small amount of pericardial fluid simulates a mildly enlarged AP window lymph node. The mainstem bronchi and trachea are patent. Esophagus is nonacute. No thyroid mass. Lungs/Pleura: Dependent atelectasis at the lung bases. No pneumonic consolidation. Musculoskeletal: No chest wall mass or suspicious bone lesions identified. CT ABDOMEN PELVIS FINDINGS Hepatobiliary: No focal liver abnormality is seen. No gallstones, gallbladder wall thickening, or biliary dilatation. Pancreas: Unremarkable. No pancreatic ductal dilatation or surrounding inflammatory changes. Spleen: Normal in size without focal abnormality. Adrenals/Urinary Tract: Adrenal glands are unremarkable. Kidneys are normal, without renal  calculi, focal lesion, or hydronephrosis. Bladder is unremarkable. Stomach/Bowel: Stomach is within normal limits. Appendix appears normal. No evidence of bowel wall thickening, distention, or inflammatory changes. Increased colonic stool burden within large bowel. Sigmoid and descending diverticulosis without acute diverticulitis. No  annular constricting lesions are apparent. Vascular/Lymphatic: Aortoiliac atherosclerosis. Patent celiac axis, SMA, portal and splenic veins. No lymphadenopathy. Reproductive: Status post hysterectomy. No adnexal masses. Other: No abdominal wall hernia or abnormality. No abdominopelvic ascites. Musculoskeletal: No acute or significant osseous findings. IMPRESSION: 1. No CT findings to explain the patient's weight loss. 2. No acute cardiothoracic, abdominal or pelvic findings. 3. Three-vessel coronary arteriosclerosis. 4. Moderate colonic stool burden with colonic diverticulosis. No acute bowel inflammation. Normal appendix. Electronically Signed   By: Ashley Royalty M.D.   On: 03/28/2017 00:39   Mr Jeri Cos IR Contrast  Result Date: 03/27/2017 CLINICAL DATA:  Stroke.  Right-sided headaches. EXAM: MRI HEAD WITHOUT AND WITH CONTRAST TECHNIQUE: Multiplanar, multiecho pulse sequences of the brain and surrounding structures were obtained without and with intravenous contrast. CONTRAST:  24mL MULTIHANCE GADOBENATE DIMEGLUMINE 529 MG/ML IV SOLN COMPARISON:  CTA head neck from yesterday FINDINGS: Brain: Right more than left occipital pole signal abnormality which is T2/FLAIR hyperintense and serpiginously enhancing. There could be small superimposed infarcts, with ADC map difficult to interpret in this setting. Remote small infarcts in the bilateral cerebellum. 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 There is T1 hyperintensity along the cortex of the right occipital infarct, with minimal gradient signal hypointensity, likely cortical laminar necrosis. Remote lacunar infarcts in thalami. No acute hemorrhage, hydrocephalus, or mass. Scattered FLAIR hyperintensities in the cerebral white matter, nonspecific but likely microvascular ischemic. 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 Skull and upper cervical spine: No marrow signal abnormality. Sinuses/Orbits:  Negative IMPRESSION: 1. Subacute infarcts in the bilateral occipital poles. Small more acute infarcts may be superimposed. Patient has other small remote posterior circulation infarcts. 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 2. Known cerebral aneurysms. Electronically Signed   By: Monte Fantasia M.D.   On: 03/27/2017 09:30   Ct Abdomen Pelvis W Contrast  Result Date: 03/28/2017 CLINICAL DATA:  Stroke, weight loss and hypertension EXAM: CT CHEST, ABDOMEN, AND PELVIS WITH CONTRAST TECHNIQUE: Multidetector CT imaging of the chest, abdomen and pelvis was performed following the standard protocol during bolus administration of intravenous contrast. CONTRAST:  32mL ISOVUE-300 IOPAMIDOL (ISOVUE-300) INJECTION 61% COMPARISON:  None. FINDINGS: CT CHEST FINDINGS Cardiovascular: Normal size cardiac chambers with tiny anterior 7 mm thick pericardial effusion. No aortic aneurysm. There is aortic atherosclerosis without dissection. There is three-vessel coronary arteriosclerosis. Atherosclerosis along the proximal great vessels with normal branch pattern from the arch. Mediastinum/Nodes: No supraclavicular, axillary, mediastinal nor hilar adenopathy. Small amount of pericardial fluid simulates a mildly enlarged AP window lymph node. The mainstem bronchi and trachea are patent. Esophagus is nonacute. No thyroid mass. Lungs/Pleura: Dependent atelectasis at the lung bases. No pneumonic consolidation. Musculoskeletal: No chest wall mass or suspicious bone lesions identified. CT ABDOMEN PELVIS FINDINGS Hepatobiliary: No focal liver abnormality is seen. No gallstones, gallbladder wall thickening, or biliary dilatation. Pancreas: Unremarkable. No pancreatic ductal dilatation or surrounding inflammatory changes. Spleen: Normal in size without focal abnormality. Adrenals/Urinary Tract: Adrenal glands are unremarkable. Kidneys are normal, without renal calculi, focal lesion, or hydronephrosis. Bladder is  unremarkable. Stomach/Bowel: Stomach is within normal limits. Appendix appears normal. No evidence of bowel wall thickening, distention, or inflammatory changes. Increased  colonic stool burden within large bowel. Sigmoid and descending diverticulosis without acute diverticulitis. No annular constricting lesions are apparent. Vascular/Lymphatic: Aortoiliac atherosclerosis. Patent celiac axis, SMA, portal and splenic veins. No lymphadenopathy. Reproductive: Status post hysterectomy. No adnexal masses. Other: No abdominal wall hernia or abnormality. No abdominopelvic ascites. Musculoskeletal: No acute or significant osseous findings. IMPRESSION: 1. No CT findings to explain the patient's weight loss. 2. No acute cardiothoracic, abdominal or pelvic findings. 3. Three-vessel coronary arteriosclerosis. 4. Moderate colonic stool burden with colonic diverticulosis. No acute bowel inflammation. Normal appendix. Electronically Signed   By: Ashley Royalty M.D.   On: 03/28/2017 00:39    12-lead ECG sinus rhythm, rate 76, LVH All prior EKG's in EPIC reviewed with no documented atrial fibrillation  Telemetry sinus rhythm  Assessment and Plan:  1. Cryptogenic stroke The patient presents with cryptogenic stroke.  The patient has a TEE planned for this AM.  I spoke at length with the patient about monitoring for afib with an implantable loop recorder.  Risks, benefits, and alteratives to implantable loop recorder were discussed with the patient today.   At this time, the patient is very clear in their decision to proceed with implantable loop recorder.   She is in the process of getting insurance. We discussed holding off on implant until insurance was obtained and reviewed monthly charges for monitoring. She would like to proceed and is clear that she can afford monthly monitoring until insurance is obtained.   She has had recent unintentional weight loss. CT scan this admission negative for malignancy.  She has had one  prior GI bleed at home that did not require transfusion or admission ([facility]BPR).   Wound care was reviewed with the patient (keep incision clean and dry for 3 days).  Wound check scheduled for 04/16/17 at 10:30AM  Chanetta Marshall, NP 03/28/2017 11:24 AM  I have seen, examined the patient, and reviewed the above assessment and plan.  On exam, she is thin and frail.  RRR.  Changes to above are made where necessary.  If TEE is unrevealing, will proceed with ILR placement.  Risks of procedure discussed with the patient who wishes to proceed.  We discussed insurance and costs of remote monitoring.  She remains clear in her decision to proceed.  Co Sign: Thompson Grayer, MD 03/28/2017 2:38 PM

## 2017-03-28 NOTE — Care Management Note (Addendum)
Case Management Note  Patient Details  Name: Leighla Chestnutt MRN: [id number] Date of Birth: [birth year]  Subjective/Objective:                    Action/Plan: Plan when patient is ready for d/c is home with self care. No f/u per PT/OT. CM met with the patient concerning no PCP and no insurance. Patient is active with [facility]. She plans on continuing to see the MD's at the clinic and CM reminded her about using the pharmacy there to assist with the cost of her meds. Pt verbalized understanding. If patient d/c over the weekend and depending on d/c meds and cost patient may need [place] letter. CM following.  4/28 D Tori Dattilio patient provided with Good Rx coupon for lipitor Expected Discharge Date:                  Expected Discharge Plan:  Home/Self Care  In-House Referral:     Discharge planning Services     Post Acute Care Choice:    Choice offered to:     DME Arranged:    DME Agency:     HH Arranged:    [place] Agency:     Status of Service:  Completed, signed off  If discussed at [employer] [facility] [employer], dates discussed:    Additional Comments:  Pollie Friar, RN 03/28/2017, 4:18 PM

## 2017-03-28 NOTE — Progress Notes (Signed)
STROKE TEAM PROGRESS NOTE   HISTORY OF PRESENT ILLNESS (per record) Amy Howell is an 61 y.o. female who was sent to [facility] ED from her ophthalmologist's office after exam there revealed a left monocular altitudinal visual field defect, which is evident on the visual field maps brought in from the ophthalmologist's office by the patient. Her ophthalmologist was concerned for possible intracerebral hemorrhage as well, given elevated pressure in her left eye. She states that her symptoms of left eye vision loss have been intermittent since February. She also has had vertigo, severe right eye pain, right face pain and left eye pain since February. Patient was not administered IV t-PA. She was admitted for further evaluation and treatment.   SUBJECTIVE (INTERVAL HISTORY) No family at bedside. 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 Pan CT no evidence of malignancy. TEE done unremarkable. Loop recorder placement pending.    OBJECTIVE Temp:  [98 F (36.7 C)-98.4 F (36.9 C)] 98.1 F (36.7 C) (04/27 0935) Pulse Rate:  [73-97] 75 (04/27 0935) Cardiac Rhythm: Normal sinus rhythm (04/27 0756) Resp:  [16-18] 16 (04/27 0935) BP: (123-192)/(65-81) 143/72 (04/27 0935) SpO2:  [98 %-100 %] 100 % (04/27 0935)  CBC:   Recent Labs Lab 03/26/17 1856  WBC 6.6  NEUTROABS 4.2  HGB 12.6  HCT 39.0  MCV 93.8  PLT 161    Basic Metabolic Panel:   Recent Labs Lab 03/26/17 1856  NA 138  K 3.6  CL 101  CO2 29  GLUCOSE 176*  BUN 15  CREATININE 0.91  CALCIUM 9.9    Lipid Panel:     Component Value Date/Time   CHOL 183 03/27/2017 1056   TRIG 84 03/27/2017 1056   HDL 40 (L) 03/27/2017 1056   CHOLHDL 4.6 03/27/2017 1056   VLDL 17 03/27/2017 1056   LDLCALC 126 (H) 03/27/2017 1056   HgbA1c:  Lab Results  Component Value Date   HGBA1C 5.8 (H) 03/27/2017   Urine Drug Screen: No results found for: LABOPIA, COCAINSCRNUR, LABBENZ, AMPHETMU, THCU, LABBARB  Alcohol Level No results found for:  [place] I have personally reviewed the radiological images below and agree with the radiology interpretations.  Ct Head Wo Contrast 03/26/2017 Subacute to chronic RIGHT occipital lobe/ PCA territory infarct. Old bilateral cerebellar infarcts. Mild chronic small vessel ischemic disease.   Ct Angio Head W Or Wo Contrast Ct Angio Neck W And/or Wo Contrast 03/26/2017 1. No emergent large vessel occlusion of the intracranial circulation. 2. 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 3. 3 x 4 mm inferiorly directed aneurysm arising from the bifurcation of the right middle cerebral artery. 4. 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 Both PCAs are of normal caliber. 5. No occlusion, dissection or hemodynamically significant stenosis of the cervical arteries.   Mr Kizzie Fantasia Contrast 03/27/2017 1. Subacute infarcts in the bilateral occipital poles. Small more acute infarcts may be superimposed. Patient has other small remote posterior circulation infarcts. 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 2. Known cerebral aneurysms.   2D Echocardiogram  - Left ventricle: GLLS is normal at -19.9% The cavity size was normal. There was mild concentric hypertrophy. Systolic function was vigorous. The estimated ejection fraction was in the range of 65% to 70%. Wall motion was normal; there were no regional wall motion abnormalities. There was an increased relative  contribution of atrial contraction to ventricular filling. Doppler parameters are consistent with abnormal left ventricular relaxation (grade 1 diastolic dysfunction). - Aortic valve: Trileaflet; mildly thickened, mildly calcified leaflets. There was mild regurgitation. - Tricuspid valve: There was mild regurgitation.  CT chest and abd/pelvis  1. No CT findings to  explain the patient's weight loss. 2. No acute cardiothoracic, abdominal or pelvic findings. 3. Three-vessel coronary arteriosclerosis. 4. Moderate colonic stool burden with colonic diverticulosis. No acute bowel inflammation. Normal appendix.  TEE - Vigorous LV systolic function; LVH; mild AI; no LAA thrombus; negative saline microcavitation study.   PHYSICAL EXAM  Temp:  [98 F (36.7 C)-98.4 F (36.9 C)] 98.1 F (36.7 C) (04/27 0935) Pulse Rate:  [73-97] 75 (04/27 0935) Resp:  [16-18] 16 (04/27 0935) BP: (123-192)/(65-81) 143/72 (04/27 0935) SpO2:  [98 %-100 %] 100 % (04/27 0935)  General - Well nourished, well developed, in no apparent distress.  Ophthalmologic - Fundi not visualized due to eye movement.  Cardiovascular - Regular rate and rhythm.  Mental Status -  Level of arousal and orientation to time, place, and person were intact. Language including expression, naming, repetition, comprehension was assessed and found intact. Fund of Knowledge was assessed and was intact.  Cranial Nerves II - XII - II - left homonymous hemianopia. III, IV, VI - Extraocular movements intact. V - Facial sensation intact bilaterally. VII - Facial movement intact bilaterally. VIII - Hearing & vestibular intact bilaterally. X - Palate elevates symmetrically. XI - Chin turning & shoulder shrug intact bilaterally. XII - Tongue protrusion intact.  Motor Strength - The patient's strength was normal in all extremities and pronator drift was absent.  Bulk was normal and fasciculations were absent.   Motor Tone - Muscle tone was assessed at the neck and appendages and was normal.  Reflexes - The patient's reflexes were 1+ in all extremities and she had no pathological reflexes.  Sensory - Light touch, temperature/pinprick were assessed and were symmetrical.    Coordination - The patient had normal movements in the hands with no ataxia or dysmetria.  Tremor was absent.  Gait and Station -  deferred.   ASSESSMENT/PLAN Amy Howell is a 61 y.o. female with history of HTN presenting with HA x 2 months and vision field defect found at opthalmologists ofice. She did not receive IV t-PA.   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 Needs to rule out maligancy.  Resultant  Left hemianopia  Ct head subacute to chronic R occipital/PCA infarct. Old B cerebellar infarcts. small vessel disease  CTA head / neck no ELVO. 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   MRI  subacute B occipital infarcts with superimposed acute infarcts. Old posterior circulation infarcts. BA stenosis. cerebral aneurysms.  2D Echo  EF 65-70%. no source of embolus   CT Abd/Chest/Pelvis no malignancy  TEE unremarkable, no PFO   loop recorder pending  LDL 126  HgbA1c 5.8  Lovenox 30 mg sq daily for VTE prophylaxis Diet NPO time specified  No antithrombotic prior to admission, now on aspirin 325 mg daily. Continue ASA on discharge.  Patient counseled to be compliant with her antithrombotic medications  Ongoing aggressive stroke risk factor management  Therapy recommendations:  No PT  Disposition:  pending   Cerebral aneurysms   7x4 R MCA aneurysm.   3x4 inferior bifurcation R MCA aneurysm.  Need outpt referral to interventional neuroradiology Dr. Estanislado Pandy  Hypertension  Elevated on admission 233/90, 192/81  Stable now Permissive hypertension (OK if < 220/120) but gradually normalize in 5-7 days Long-term BP goal normotensive  Hyperlipidemia  Home meds:  No statin  LDL 126, goal < 70  Now on lipitor 40 mg daily  Continue statin at discharge  Tobacco abuse  Current smoker  Smoking cessation counseling provided  Pt is willing to quit  Other Stroke Risk Factors  Chronic vertigo  Hospital day # 2  Neurology will sign off. Please call with questions. Pt will follow up with  stroke clinic at [facility] in about 6 weeks. Thanks for the consult.   Rosalin Hawking, MD PhD Stroke Neurology 03/28/2017 10:30 PM     To contact Stroke Continuity provider, please refer to [email or web address] After hours, contact General Neurology

## 2017-03-28 NOTE — Progress Notes (Signed)
PROGRESS NOTE    Amy Howell  OEH:[id number] DOB: 03-30-1956 DOA: 03/26/2017 PCP: No PCP Per Patient   Brief Narrative: 61 year old female with history of hypertension presented with intermittent headache. Patient was seen by ophthalmologist for change in vision when there was concern for increased intracranial pressure therefore sent to the hospital for further evaluation. Patient was found to have a stroke.  Assessment & Plan:  #  Subacute right occipital lobe ischemic infarction: Evaluation ongoing by neurologist. -MRI of the brain with subacute infarction in the bilateral occipital poles. -2-D echo with EF of 65-70%. Plan for TEE and possible loop recorder placement by cardiologist. -Follow-up  vascular lower extremity Doppler -Continue aspirin and Lipitor -LDL 126, A1c 5.8 -PT OT therapy -Social worker evaluation  #Essential hypertension: Continue to monitor blood pressure. Norvasc on hold.  #Significant weight loss and possible severe protein calorie moderation: CT scan of chest abdomen and pelvis unremarkable for any malignancy.  # Cerebral aneurysms : Needs outpatient referral   Patient was recently moved from [place]  with her daughter. She was asking to talk to Education officer, museum. Social worker consulted. Continue current medical and supportive care. Follow-up neurology.  Principal Problem:   Stroke (cerebrum) (HCC) Active Problems:   Essential hypertension   Aneurysm of middle cerebral artery   Weight loss, unintentional   Basilar artery stenosis   Smoker  DVT prophylaxis: Lovenox subcutaneous Code Status: Full code Family Communication: No family at bedside Disposition Plan: Likely discharge home in 1-2 days    Consultants:   Neurology  Procedures: None Antimicrobials: None  Subjective: Patient was seen and examined at bedside. Denied headache, dizziness, nausea, vomiting, chest pain, shortness of breath.  Objective: Vitals:   03/27/17 2057 03/28/17  0045 03/28/17 0525 03/28/17 0935  BP: (!) 141/65 127/77 (!) 141/73 (!) 143/72  Pulse: 84 80 73 75  Resp: 16 16 16 16   Temp: 98.3 F (36.8 C) 98.1 F (36.7 C) 98.4 F (36.9 C) 98.1 F (36.7 C)  TempSrc: Oral Oral Oral Oral  SpO2: 100% 100% 100% 100%  Weight:      Height:       No intake or output data in the 24 hours ending 03/28/17 1252 Filed Weights   03/26/17 2339  Weight: 45.3 kg (99 lb 12.8 oz)    Examination:  General exam: Not in distress, lying in bed comfortable  Respiratory system: Clear bilateral. Respiratory effort normal. No wheezing or crackle Cardiovascular system: Regular rate rhythm, S1 is normal.  No pedal edema. Gastrointestinal system: Abdomen is nondistended, soft and nontender. Normal bowel sounds heard. Central nervous system: Alert and oriented. No focal neurological deficits. Extremities: Symmetric 5 x 5 power. Skin: No rashes, lesions or ulcers Psychiatry: Judgement and insight appear normal. Mood & affect appropriate.     Data Reviewed: I have personally reviewed following labs and imaging studies  CBC:  Recent Labs Lab 03/26/17 1856  WBC 6.6  NEUTROABS 4.2  HGB 12.6  HCT 39.0  MCV 93.8  PLT 037   Basic Metabolic Panel:  Recent Labs Lab 03/26/17 1856  NA 138  K 3.6  CL 101  CO2 29  GLUCOSE 176*  BUN 15  CREATININE 0.91  CALCIUM 9.9   GFR: Estimated Creatinine Clearance: 45.3 mL/min (by C-G formula based on SCr of 0.91 mg/dL). Liver Function Tests:  Recent Labs Lab 03/26/17 1856  AST 20  ALT 10*  ALKPHOS 81  BILITOT 0.4  PROT 7.8  ALBUMIN 4.1   No results  for input(s): LIPASE, AMYLASE in the last 168 hours. No results for input(s): AMMONIA in the last 168 hours. Coagulation Profile:  Recent Labs Lab 03/26/17 1856  INR 1.05   Cardiac Enzymes:  Recent Labs Lab 03/28/17 0453  CKTOTAL 42   BNP (last 3 results) No results for input(s): PROBNP in the last 8760 hours. HbA1C:  Recent Labs  03/27/17 1056    HGBA1C 5.8*   CBG: No results for input(s): GLUCAP in the last 168 hours. Lipid Profile:  Recent Labs  03/27/17 1056  CHOL 183  HDL 40*  LDLCALC 126*  TRIG 84  CHOLHDL 4.6   Thyroid Function Tests: No results for input(s): TSH, T4TOTAL, FREET4, T3FREE, THYROIDAB in the last 72 hours. Anemia Panel: No results for input(s): VITAMINB12, FOLATE, FERRITIN, TIBC, IRON, RETICCTPCT in the last 72 hours. Sepsis Labs: No results for input(s): PROCALCITON, LATICACIDVEN in the last 168 hours.  No results found for this or any previous visit (from the past 240 hour(s)).       Radiology Studies: Ct Angio Head W Or Wo Contrast  Result Date: 03/26/2017 CLINICAL DATA:  Infarct identified on head CT EXAM: CT ANGIOGRAPHY HEAD AND NECK TECHNIQUE: Multidetector CT imaging of the head and neck was performed using the standard protocol during bolus administration of intravenous contrast. Multiplanar CT image reconstructions and MIPs were obtained to evaluate the vascular anatomy. Carotid stenosis measurements (when applicable) are obtained utilizing NASCET criteria, using the distal internal carotid diameter as the denominator. CONTRAST:  50 mL Isovue 370 COMPARISON:  Head CT 03/26/2017 FINDINGS: CTA NECK FINDINGS Aortic arch: There is no aneurysm or dissection of the visualized ascending aorta or aortic arch. There is a normal 3 vessel branching pattern. The visualized proximal subclavian arteries are normal. Right carotid system: The right common carotid origin is widely patent. There is no common carotid or internal carotid artery dissection or aneurysm. No hemodynamically significant stenosis. Left carotid system: The left common carotid origin is widely patent. There is no common carotid or internal carotid artery dissection or aneurysm. No hemodynamically significant stenosis. Vertebral arteries: The vertebral system is left dominant. Both vertebral artery origins are normal. Both vertebral arteries  are normal to their confluence with the basilar artery. Skeleton: There is no bony spinal canal stenosis. No lytic or blastic lesions. Other neck: The nasopharynx is clear. The oropharynx and hypopharynx are normal. The epiglottis is normal. The supraglottic larynx, glottis and subglottic larynx are normal. No retropharyngeal collection. The parapharyngeal spaces are preserved. The parotid and submandibular glands are normal. No sialolithiasis or salivary ductal dilatation. The thyroid gland is normal. There is no cervical lymphadenopathy. Upper chest: No pneumothorax or pleural effusion. No nodules or masses. Review of the MIP images confirms the above findings CTA HEAD FINDINGS Anterior circulation: --Intracranial internal carotid arteries: Mild calcific atherosclerosis without hemodynamically significant stenosis. --Anterior cerebral arteries: Normal --Middle cerebral arteries: At the origin of the right middle cerebral artery, there is a posteriorly projecting aneurysm that measures 4 mm at its neck and 7 mm base to apex. At the right MCA bifurcation, there is an additional aneurysm that is oriented inferiorly and measures 3 mm at its base and 4 mm base to apex. --Posterior communicating arteries: Absent bilaterally. Posterior circulation: --Posterior cerebral arteries: Normal. --Superior cerebellar arteries: Normal. --Basilar artery: There is smooth tapering of the distal basilar artery which is narrowed to approximately 30% the caliber of the proximal basilar lumen. --Anterior inferior cerebellar arteries: Normal. --Posterior inferior cerebellar arteries: Normal.  Venous sinuses: As permitted by contrast timing, patent. Anatomic variants: None Delayed phase: No parenchymal contrast enhancement. Review of the MIP images confirms the above findings IMPRESSION: 1. No emergent large vessel occlusion of the intracranial circulation. 2. 7 x 4 mm aneurysm arising posteriorly from the proximal M1 segment of the right  middle cerebral artery. 3. 3 x 4 mm inferiorly directed aneurysm arising from the bifurcation of the right middle cerebral artery. 4. 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 Both PCAs are of normal caliber. 5. No occlusion, dissection or hemodynamically significant stenosis of the cervical arteries. Electronically Signed   By: Ulyses Jarred M.D.   On: 03/26/2017 22:51   Ct Head Wo Contrast  Result Date: 03/26/2017 CLINICAL DATA:  Intermittent headaches and vertigo since February. History of hypertension. EXAM: CT HEAD WITHOUT CONTRAST TECHNIQUE: Contiguous axial images were obtained from the base of the skull through the vertex without intravenous contrast. COMPARISON:  None. FINDINGS: BRAIN: No intraparenchymal hemorrhage, mass effect nor midline shift. The ventricles and sulci are normal for age. Mesial RIGHT occipital lobe early encephalomalacia. Old bilateral cerebellar infarcts. Patchy supratentorial white matter hypodensities within normal range for patient's age, though non-specific are most compatible with chronic small vessel ischemic disease. No acute large vascular territory infarcts. No abnormal extra-axial fluid collections. Basal cisterns are patent. VASCULAR: Moderate calcific atherosclerosis of the carotid siphons and to lesser extent intradural vertebral artery's. SKULL: No skull fracture. No significant scalp soft tissue swelling. SINUSES/ORBITS: The mastoid air-cells and included paranasal sinuses are well-aerated.The included ocular globes and orbital contents are non-suspicious. OTHER: None. IMPRESSION: Subacute to chronic RIGHT occipital lobe/ PCA territory infarct. Old bilateral cerebellar infarcts. Recommend MRA versus CTA to evaluate for posterior circulation abnormality. Mild chronic small vessel ischemic disease. Electronically Signed   By: Elon Alas M.D.   On: 03/26/2017 19:25    Ct Angio Neck W And/or Wo Contrast  Result Date: 03/26/2017 CLINICAL DATA:  Infarct identified on head CT EXAM: CT ANGIOGRAPHY HEAD AND NECK TECHNIQUE: Multidetector CT imaging of the head and neck was performed using the standard protocol during bolus administration of intravenous contrast. Multiplanar CT image reconstructions and MIPs were obtained to evaluate the vascular anatomy. Carotid stenosis measurements (when applicable) are obtained utilizing NASCET criteria, using the distal internal carotid diameter as the denominator. CONTRAST:  50 mL Isovue 370 COMPARISON:  Head CT 03/26/2017 FINDINGS: CTA NECK FINDINGS Aortic arch: There is no aneurysm or dissection of the visualized ascending aorta or aortic arch. There is a normal 3 vessel branching pattern. The visualized proximal subclavian arteries are normal. Right carotid system: The right common carotid origin is widely patent. There is no common carotid or internal carotid artery dissection or aneurysm. No hemodynamically significant stenosis. Left carotid system: The left common carotid origin is widely patent. There is no common carotid or internal carotid artery dissection or aneurysm. No hemodynamically significant stenosis. Vertebral arteries: The vertebral system is left dominant. Both vertebral artery origins are normal. Both vertebral arteries are normal to their confluence with the basilar artery. Skeleton: There is no bony spinal canal stenosis. No lytic or blastic lesions. Other neck: The nasopharynx is clear. The oropharynx and hypopharynx are normal. The epiglottis is normal. The supraglottic larynx, glottis and subglottic larynx are normal. No retropharyngeal collection. The parapharyngeal spaces are preserved. The parotid and submandibular glands are normal. No sialolithiasis or salivary ductal dilatation. The thyroid gland  is normal. There is no cervical lymphadenopathy. Upper chest: No pneumothorax or pleural effusion. No nodules or  masses. Review of the MIP images confirms the above findings CTA HEAD FINDINGS Anterior circulation: --Intracranial internal carotid arteries: Mild calcific atherosclerosis without hemodynamically significant stenosis. --Anterior cerebral arteries: Normal --Middle cerebral arteries: At the origin of the right middle cerebral artery, there is a posteriorly projecting aneurysm that measures 4 mm at its neck and 7 mm base to apex. At the right MCA bifurcation, there is an additional aneurysm that is oriented inferiorly and measures 3 mm at its base and 4 mm base to apex. --Posterior communicating arteries: Absent bilaterally. Posterior circulation: --Posterior cerebral arteries: Normal. --Superior cerebellar arteries: Normal. --Basilar artery: There is smooth tapering of the distal basilar artery which is narrowed to approximately 30% the caliber of the proximal basilar lumen. --Anterior inferior cerebellar arteries: Normal. --Posterior inferior cerebellar arteries: Normal. Venous sinuses: As permitted by contrast timing, patent. Anatomic variants: None Delayed phase: No parenchymal contrast enhancement. Review of the MIP images confirms the above findings IMPRESSION: 1. No emergent large vessel occlusion of the intracranial circulation. 2. 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 3. 3 x 4 mm inferiorly directed aneurysm arising from the bifurcation of the right middle cerebral artery. 4. 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 Both PCAs are of normal caliber. 5. No occlusion, dissection or hemodynamically significant stenosis of the cervical arteries. Electronically Signed   By: Ulyses Jarred M.D.   On: 03/26/2017 22:51   Ct Chest W Contrast  Result Date: 03/28/2017 CLINICAL DATA:  Stroke, weight loss and hypertension EXAM: CT CHEST, ABDOMEN, AND  PELVIS WITH CONTRAST TECHNIQUE: Multidetector CT imaging of the chest, abdomen and pelvis was performed following the standard protocol during bolus administration of intravenous contrast. CONTRAST:  29mL ISOVUE-300 IOPAMIDOL (ISOVUE-300) INJECTION 61% COMPARISON:  None. FINDINGS: CT CHEST FINDINGS Cardiovascular: Normal size cardiac chambers with tiny anterior 7 mm thick pericardial effusion. No aortic aneurysm. There is aortic atherosclerosis without dissection. There is three-vessel coronary arteriosclerosis. Atherosclerosis along the proximal great vessels with normal branch pattern from the arch. Mediastinum/Nodes: No supraclavicular, axillary, mediastinal nor hilar adenopathy. Small amount of pericardial fluid simulates a mildly enlarged AP window lymph node. The mainstem bronchi and trachea are patent. Esophagus is nonacute. No thyroid mass. Lungs/Pleura: Dependent atelectasis at the lung bases. No pneumonic consolidation. Musculoskeletal: No chest wall mass or suspicious bone lesions identified. CT ABDOMEN PELVIS FINDINGS Hepatobiliary: No focal liver abnormality is seen. No gallstones, gallbladder wall thickening, or biliary dilatation. Pancreas: Unremarkable. No pancreatic ductal dilatation or surrounding inflammatory changes. Spleen: Normal in size without focal abnormality. Adrenals/Urinary Tract: Adrenal glands are unremarkable. Kidneys are normal, without renal calculi, focal lesion, or hydronephrosis. Bladder is unremarkable. Stomach/Bowel: Stomach is within normal limits. Appendix appears normal. No evidence of bowel wall thickening, distention, or inflammatory changes. Increased colonic stool burden within large bowel. Sigmoid and descending diverticulosis without acute diverticulitis. No annular constricting lesions are apparent. Vascular/Lymphatic: Aortoiliac atherosclerosis. Patent celiac axis, SMA, portal and splenic veins. No lymphadenopathy. Reproductive: Status post hysterectomy. No adnexal  masses. Other: No abdominal wall hernia or abnormality. No abdominopelvic ascites. Musculoskeletal: No acute or significant osseous findings. IMPRESSION: 1. No CT findings to explain the patient's weight loss. 2. No acute cardiothoracic, abdominal or pelvic findings. 3. Three-vessel coronary arteriosclerosis. 4. Moderate colonic stool  burden with colonic diverticulosis. No acute bowel inflammation. Normal appendix. Electronically Signed   By: Ashley Royalty M.D.   On: 03/28/2017 00:39   Mr Jeri Cos TS Contrast  Result Date: 03/27/2017 CLINICAL DATA:  Stroke.  Right-sided headaches. EXAM: MRI HEAD WITHOUT AND WITH CONTRAST TECHNIQUE: Multiplanar, multiecho pulse sequences of the brain and surrounding structures were obtained without and with intravenous contrast. CONTRAST:  43mL MULTIHANCE GADOBENATE DIMEGLUMINE 529 MG/ML IV SOLN COMPARISON:  CTA head neck from yesterday FINDINGS: Brain: Right more than left occipital pole signal abnormality which is T2/FLAIR hyperintense and serpiginously enhancing. There could be small superimposed infarcts, with ADC map difficult to interpret in this setting. Remote small infarcts in the bilateral cerebellum. 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 There is T1 hyperintensity along the cortex of the right occipital infarct, with minimal gradient signal hypointensity, likely cortical laminar necrosis. Remote lacunar infarcts in thalami. No acute hemorrhage, hydrocephalus, or mass. Scattered FLAIR hyperintensities in the cerebral white matter, nonspecific but likely microvascular ischemic. 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 Skull and upper cervical spine: No marrow signal abnormality. Sinuses/Orbits: Negative IMPRESSION: 1. Subacute infarcts in the bilateral occipital poles. Small more acute infarcts may be superimposed. Patient has other small remote posterior circulation infarcts. Known basilar stenosis at an  irregular plaque by CTA yesterday. 2. Known cerebral aneurysms. Electronically Signed   By: Monte Fantasia M.D.   On: 03/27/2017 09:30   Ct Abdomen Pelvis W Contrast  Result Date: 03/28/2017 CLINICAL DATA:  Stroke, weight loss and hypertension EXAM: CT CHEST, ABDOMEN, AND PELVIS WITH CONTRAST TECHNIQUE: Multidetector CT imaging of the chest, abdomen and pelvis was performed following the standard protocol during bolus administration of intravenous contrast. CONTRAST:  54mL ISOVUE-300 IOPAMIDOL (ISOVUE-300) INJECTION 61% COMPARISON:  None. FINDINGS: CT CHEST FINDINGS Cardiovascular: Normal size cardiac chambers with tiny anterior 7 mm thick pericardial effusion. No aortic aneurysm. There is aortic atherosclerosis without dissection. There is three-vessel coronary arteriosclerosis. Atherosclerosis along the proximal great vessels with normal branch pattern from the arch. Mediastinum/Nodes: No supraclavicular, axillary, mediastinal nor hilar adenopathy. Small amount of pericardial fluid simulates a mildly enlarged AP window lymph node. The mainstem bronchi and trachea are patent. Esophagus is nonacute. No thyroid mass. Lungs/Pleura: Dependent atelectasis at the lung bases. No pneumonic consolidation. Musculoskeletal: No chest wall mass or suspicious bone lesions identified. CT ABDOMEN PELVIS FINDINGS Hepatobiliary: No focal liver abnormality is seen. No gallstones, gallbladder wall thickening, or biliary dilatation. Pancreas: Unremarkable. No pancreatic ductal dilatation or surrounding inflammatory changes. Spleen: Normal in size without focal abnormality. Adrenals/Urinary Tract: Adrenal glands are unremarkable. Kidneys are normal, without renal calculi, focal lesion, or hydronephrosis. Bladder is unremarkable. Stomach/Bowel: Stomach is within normal limits. Appendix appears normal. No evidence of bowel wall thickening, distention, or inflammatory changes. Increased colonic stool burden within large bowel. Sigmoid  and descending diverticulosis without acute diverticulitis. No annular constricting lesions are apparent. Vascular/Lymphatic: Aortoiliac atherosclerosis. Patent celiac axis, SMA, portal and splenic veins. No lymphadenopathy. Reproductive: Status post hysterectomy. No adnexal masses. Other: No abdominal wall hernia or abnormality. No abdominopelvic ascites. Musculoskeletal: No acute or significant osseous findings. IMPRESSION: 1. No CT findings to explain the patient's weight loss. 2. No acute cardiothoracic, abdominal or pelvic findings. 3. Three-vessel coronary arteriosclerosis. 4. Moderate colonic stool burden with colonic diverticulosis. No acute bowel inflammation. Normal appendix. Electronically Signed   By: Ashley Royalty M.D.   On: 03/28/2017 00:39  Scheduled Meds: . aspirin  300 mg Rectal Daily   Or  . aspirin  325 mg Oral Daily  . atorvastatin  40 mg Oral q1800  . enoxaparin (LOVENOX) injection  30 mg Subcutaneous Q24H   Continuous Infusions:   LOS: 2 days    Adalaya Irion Tanna Furry, MD [facility] Pager [phone]  If 7PM-7AM, please contact night-coverage [email or web address] Password [facility] 03/28/2017, 12:52 PM

## 2017-03-29 DIAGNOSIS — I6322 Cerebral infarction due to unspecified occlusion or stenosis of basilar arteries: Secondary | ICD-10-CM

## 2017-03-29 DIAGNOSIS — I671 Cerebral aneurysm, nonruptured: Secondary | ICD-10-CM

## 2017-03-29 MED ORDER — ASPIRIN 325 MG PO TABS: 325.0000 mg | ORAL_TABLET | Freq: Every day | ORAL | 0 refills | Status: DC

## 2017-03-29 MED ORDER — ATORVASTATIN CALCIUM 40 MG PO TABS
40.0000 mg | ORAL_TABLET | Freq: Every day | ORAL | 0 refills | Status: DC
Start: 2017-03-29 — End: 2017-04-10

## 2017-03-29 NOTE — Progress Notes (Signed)
Patient ready for discharge to home; discharge instructions given and reviewed; Rx's given; information reviewed with patient and daughters present at bedside; patient discharged out via wheelchair by volunteer services.

## 2017-03-29 NOTE — Discharge Summary (Addendum)
Physician Discharge Summary  Amy Howell CXK:[id number] DOB: Sep 26, 1956 DOA: 03/26/2017  PCP: No PCP Per Patient  Admit date: 03/26/2017 Discharge date: 03/29/2017  Admitted From:home Disposition:home  Recommendations for Outpatient Follow-up:  1. Follow up with PCP in 1-2 weeks 2. Please obtain BMP/CBC in one week   Home Health:no Equipment/Devices:no Discharge Condition:stable CODE STATUS:full Diet recommendation:heart healthy  Brief/Interim Summary: 61 year old female with history of hypertension presented with intermittent headache. Patient was seen by ophthalmologist for change in vision when there was concern for increased intracranial pressure therefore sent to the hospital for further evaluation. Patient was found to have a stroke.  #  Subacute b/l occipital lobe ischemic infarction: Likely embolic secondary to unknown source -MRI of the brain with subacute infarction in the bilateral occipital poles. -2-D echo with EF of 65-70%. TEE unremarkable with no PFO. Lopid placed by cardiologist. Wound looks clean. Plan to follow-up with her cardiologist outpatient. Patient verbalized understanding. Discussed about keeping wound site dry with the patient. She verbalized understanding. -Continue aspirin and Lipitor on discharge -LDL 126, A1c 5.8 -PT OT therapy. Patient is able to ambulate and her weakness resolved. Patient will be discharged home in stable condition with outpatient follow-up with neurology, radiologist, PCP and cardiologist. Patient verbalized understanding.  #Essential hypertension: Continue home medication. Monitor blood pressure.  #Significant weight loss and possible severe protein calorie moderation: CT scan of chest abdomen and pelvis unremarkable for any malignancy.  # Cerebral aneurysms : Outpatient referral to neurology and neuro radiologist. Patient is stable.  I also discussed with the patient's daughter over the phone regarding discharge plan,  medications and follow-up. She verbalized understanding.  Discharge Diagnoses:  Principal Problem:   Stroke (cerebrum) ([place]) Active Problems:   Essential hypertension   Aneurysm of middle cerebral artery   Weight loss, unintentional   Basilar artery stenosis   Smoker   Cryptogenic stroke [facility])    Discharge Instructions  Discharge Instructions    Ambulatory referral to Neurology    Complete by:  As directed    Follow up with stroke clinic at [facility] in 6 weeks. Thanks.   Call MD for:  difficulty breathing, headache or visual disturbances    Complete by:  As directed    Call MD for:  extreme fatigue    Complete by:  As directed    Call MD for:  hives    Complete by:  As directed    Call MD for:  persistant dizziness or light-headedness    Complete by:  As directed    Call MD for:  persistant nausea and vomiting    Complete by:  As directed    Call MD for:  redness, tenderness, or signs of infection (pain, swelling, redness, odor or green/yellow discharge around incision site)    Complete by:  As directed    Call MD for:  severe uncontrolled pain    Complete by:  As directed    Call MD for:  temperature >100.4    Complete by:  As directed    Diet - low sodium heart healthy    Complete by:  As directed    Discharge instructions    Complete by:  As directed    Please keep chest wound dry for at least for 3 days. f/u cardiology.  Also please follow up with your PCP in 1-2 weeks.   Increase activity slowly    Complete by:  As directed      Allergies as of 03/29/2017   No Known  Allergies     Medication List    TAKE these medications   amLODipine 10 MG tablet Commonly known as:  NORVASC Take 1 tablet (10 mg total) by mouth daily.   aspirin 325 MG tablet Take 1 tablet (325 mg total) by mouth daily.   atorvastatin 40 MG tablet Commonly known as:  LIPITOR Take 1 tablet (40 mg total) by mouth daily at 6 PM.   meclizine 25 MG tablet Commonly known as:  ANTIVERT Take  0.5-1 tablets (12.5-25 mg total) by mouth 3 (three) times daily as needed for dizziness or nausea.   meloxicam 7.5 MG tablet Commonly known as:  MOBIC 1 bid prn pain   oxyCODONE-acetaminophen 5-325 MG tablet Commonly known as:  PERCOCET Take 1 tablet by mouth every 6 (six) hours as needed for severe pain.      Follow-up Information    stroke clinic at [facility] Follow up in 6 week(s).   Why:  clinic will call you for appointment       Thompson Grayer, MD Follow up on 04/16/2017.   Specialty:  Cardiology Why:  for wound check. please call to confirm the appointment and time.  Contact information: [address] [place] [address] [phone]        Rob Hickman, MD. Schedule an appointment as soon as possible for a visit in 1 month(s).   Specialty:  Interventional Radiology Why:  for cerebral aneurysm. Contact information: [address] [place] [address] [phone]          No Known Allergies  Consultations: Neurology Cardiology  Procedures/Studies: TEE, cardiac Loop recorder placement  Subjective: Patient was seen and examined at bedside. Denied headache, dizziness, nausea, vomiting, chest pain, shortness of breath. No weakness.  Discharge Exam: Vitals:   03/29/17 0500 03/29/17 0955  BP: 131/76 (!) 152/76  Pulse: 68 72  Resp: 18   Temp: 97.7 F (36.5 C) 97.7 F (36.5 C)   Vitals:   03/28/17 2055 03/29/17 0048 03/29/17 0500 03/29/17 0955  BP: (!) 151/81 (!) 170/80 131/76 (!) 152/76  Pulse: 79 69 68 72  Resp: 18 18 18    Temp: 98.1 F (36.7 C) 98.1 F (36.7 C) 97.7 F (36.5 C) 97.7 F (36.5 C)  TempSrc: Oral Oral Oral Oral  SpO2: 98% 100% 100% 99%  Weight:      Height:        General: Pt is alert, awake, not in acute distress Cardiovascular: RRR, S1/S2 +, no rubs, no gallops Respiratory: CTA bilaterally, no wheezing, no rhonchi Abdominal: Soft, NT, ND, bowel sounds + Extremities: no edema, no  cyanosis    The results of significant diagnostics from this hospitalization (including imaging, microbiology, ancillary and laboratory) are listed below for reference.     Microbiology: No results found for this or any previous visit (from the past 240 hour(s)).   Labs: BNP (last 3 results) No results for input(s): BNP in the last 8760 hours. Basic Metabolic Panel:  Recent Labs Lab 03/26/17 1856  NA 138  K 3.6  CL 101  CO2 29  GLUCOSE 176*  BUN 15  CREATININE 0.91  CALCIUM 9.9   Liver Function Tests:  Recent Labs Lab 03/26/17 1856  AST 20  ALT 10*  ALKPHOS 81  BILITOT 0.4  PROT 7.8  ALBUMIN 4.1   No results for input(s): LIPASE, AMYLASE in the last 168 hours. No results for input(s): AMMONIA in the last 168 hours. CBC:  Recent  Labs Lab 03/26/17 1856  WBC 6.6  NEUTROABS 4.2  HGB 12.6  HCT 39.0  MCV 93.8  PLT 161   Cardiac Enzymes:  Recent Labs Lab 03/28/17 0453  CKTOTAL 42   BNP: Invalid input(s): POCBNP CBG: No results for input(s): GLUCAP in the last 168 hours. D-Dimer No results for input(s): DDIMER in the last 72 hours. Hgb A1c  Recent Labs  03/27/17 1056  HGBA1C 5.8*   Lipid Profile  Recent Labs  03/27/17 1056  CHOL 183  HDL 40*  LDLCALC 126*  TRIG 84  CHOLHDL 4.6   Thyroid function studies No results for input(s): TSH, T4TOTAL, T3FREE, THYROIDAB in the last 72 hours.  Invalid input(s): FREET3 Anemia work up No results for input(s): VITAMINB12, FOLATE, FERRITIN, TIBC, IRON, RETICCTPCT in the last 72 hours. Urinalysis No results found for: COLORURINE, APPEARANCEUR, LABSPEC, [place], GLUCOSEU, HGBUR, BILIRUBINUR, KETONESUR, PROTEINUR, UROBILINOGEN, NITRITE, LEUKOCYTESUR Sepsis Labs Invalid input(s): PROCALCITONIN,  WBC,  LACTICIDVEN Microbiology No results found for this or any previous visit (from the past 240 hour(s)).   Time coordinating discharge: 27 minutes  SIGNED:   Rosita Fire, MD  [facility] 03/29/2017, 10:09 AM  If 7PM-7AM, please contact night-coverage [email or web address] Password [email or web address]

## 2017-03-29 NOTE — Progress Notes (Signed)
Around 2300 4/27 noted pt's dry dressing to loop recorder site to left chest  Was soaked with blood dressing reinforced with pressure dressing. AT 0300 dressing still socked with blood. Dressing change done. No active bleeding at this time . Pt c/o slight  Headache rated 4/10 on pain scale. Will give tylenol and continue to monitor.

## 2017-03-31 ENCOUNTER — Other Ambulatory Visit (HOSPITAL_COMMUNITY): Payer: Self-pay | Admitting: Interventional Radiology

## 2017-03-31 ENCOUNTER — Encounter (HOSPITAL_COMMUNITY): Payer: Self-pay | Admitting: Internal Medicine

## 2017-03-31 DIAGNOSIS — I729 Aneurysm of unspecified site: Secondary | ICD-10-CM

## 2017-04-08 ENCOUNTER — Other Ambulatory Visit: Payer: Self-pay

## 2017-04-08 NOTE — Patient Outreach (Signed)
[facility]) Care Management  04/08/2017  Amy Howell [birth year] [id number]      EMMI- STROKE RED ON EMMI ALERT Day # 6 Date: 04/07/17 Red Alert Reason: "Smoked or been around smoke? Yes"   Outreach attempt #  1 to patient.. No answer. RN CM left HIPAA compliant message along with contact info.          Plan: RN CM will make outreach attempt to patient within one business day if no return call from patient.  Enzo Montgomery, RN,BSN,CCM Center Management Telephonic Care Management Coordinator Direct Phone: [phone] Toll Free: [phone] Fax: [phone]

## 2017-04-09 ENCOUNTER — Other Ambulatory Visit: Payer: Self-pay

## 2017-04-09 NOTE — Patient Outreach (Signed)
[facility]) Care Management  04/09/2017  Nikitta Sobiech Jul 17, 1956 [id number]   EMMI- STROKE RED ON EMMI ALERT Day # 6 Date: 04/07/17 Red Alert Reason: "Smoked or been around smoke? Yes"    Outreach attempt #2 to patient. No answer at present.    Plan: RN CM will make outreach attempt to patient within one business day if no return call from patient.   Enzo Montgomery, RN,BSN,CCM [place] Management Telephonic Care Management Coordinator Direct Phone: [phone] Toll Free: [phone] Fax: [phone]

## 2017-04-10 ENCOUNTER — Encounter: Payer: Self-pay | Admitting: Family Medicine

## 2017-04-10 ENCOUNTER — Other Ambulatory Visit: Payer: Self-pay

## 2017-04-10 ENCOUNTER — Ambulatory Visit: Payer: Self-pay | Attending: Family Medicine | Admitting: Family Medicine

## 2017-04-10 VITALS — BP 130/76 | HR 98 | Temp 98.1°F | Resp 18 | Ht 60.0 in | Wt 98.4 lb

## 2017-04-10 DIAGNOSIS — E782 Mixed hyperlipidemia: Secondary | ICD-10-CM | POA: Insufficient documentation

## 2017-04-10 DIAGNOSIS — Z7982 Long term (current) use of aspirin: Secondary | ICD-10-CM | POA: Insufficient documentation

## 2017-04-10 DIAGNOSIS — R21 Rash and other nonspecific skin eruption: Secondary | ICD-10-CM | POA: Insufficient documentation

## 2017-04-10 DIAGNOSIS — I10 Essential (primary) hypertension: Secondary | ICD-10-CM | POA: Insufficient documentation

## 2017-04-10 DIAGNOSIS — Z8673 Personal history of transient ischemic attack (TIA), and cerebral infarction without residual deficits: Secondary | ICD-10-CM | POA: Insufficient documentation

## 2017-04-10 DIAGNOSIS — R7303 Prediabetes: Secondary | ICD-10-CM | POA: Insufficient documentation

## 2017-04-10 LAB — POCT UA - MICROALBUMIN
Creatinine, POC: 200 mg/dL
Microalbumin Ur, POC: 30 mg/L

## 2017-04-10 LAB — GLUCOSE, POCT (MANUAL RESULT ENTRY): POC GLUCOSE: 119 mg/dL — AB (ref 70–99)

## 2017-04-10 MED ORDER — TRUEPLUS LANCETS 28G MISC: 1.0000 | Freq: Once | 12 refills | Status: AC

## 2017-04-10 MED ORDER — TRUE METRIX METER W/DEVICE KIT: 1.0000 | PACK | Freq: Once | 0 refills | Status: AC

## 2017-04-10 MED ORDER — AMLODIPINE BESYLATE 10 MG PO TABS: 10.0000 mg | ORAL_TABLET | Freq: Every day | ORAL | 0 refills | Status: DC

## 2017-04-10 MED ORDER — ATORVASTATIN CALCIUM 40 MG PO TABS: 40.0000 mg | ORAL_TABLET | Freq: Every day | ORAL | 0 refills | Status: DC

## 2017-04-10 MED ORDER — LOSARTAN POTASSIUM 25 MG PO TABS: 25.0000 mg | ORAL_TABLET | Freq: Every day | ORAL | 0 refills | Status: DC

## 2017-04-10 MED ORDER — GLUCOSE BLOOD VI STRP: ORAL_STRIP | 12 refills | Status: DC

## 2017-04-10 NOTE — Progress Notes (Signed)
Patient is here for HTN  Patient complains about left arm pain blurry vision & patient stated that she is breaking out patient stated since she she started medication but she also notice whenever the detergent is blue or colored she is allergic to & her daughter use blue so probably its the detergent   Patient has taking her medication for today

## 2017-04-10 NOTE — Progress Notes (Signed)
Subjective:  Patient ID: Amy Howell, female    DOB: November 28, 1956  Age: 61 y.o. MRN: [id number]  CC: Hypertension   HPI Amy Howell presents for   History of CVA: Recent history of ED visit for intermittent headache pain and visual disturbance. History of HTN. She reports being seen her ophthalmologist earlier that day who referred her to the ED due to concern for ICP. Patient was evaluated and was diagnosed with stroke. She reports adherence with aspirin and Lipitor. Reports upcoming appointment with neurology. She reports history of ICD placement. She denies any electrical firing. Denies any CP, SOB, swelling of bilateral lower extremities, or claudication symptoms.   Rash: Patient complains of rash involving the bilateral arm and neck. Rash started a few days ago. Appearance of rash at onset: Other appearance: maculopapular. Rash has not changed over time Initial distribution: bilateral arm and neck.  Discomfort associated with rash: intially pruritic.  Associated symptoms: none. Denies: abdominal pain, fever, nausea and vomiting. Patient has not had previous evaluation of rash. Patient has not had previous treatment.   Patient has not had contacts with similar rash. Patient has identified precipitant. Patient has had new exposures to  laundry detergents.   Outpatient Medications Prior to Visit  Medication Sig Dispense Refill  . aspirin 325 MG tablet Take 1 tablet (325 mg total) by mouth daily. 30 tablet 0  . oxyCODONE-acetaminophen (PERCOCET) 5-325 MG tablet Take 1 tablet by mouth every 6 (six) hours as needed for severe pain. 10 tablet 0  . amLODipine (NORVASC) 10 MG tablet Take 1 tablet (10 mg total) by mouth daily. 30 tablet 1  . atorvastatin (LIPITOR) 40 MG tablet Take 1 tablet (40 mg total) by mouth daily at 6 PM. 30 tablet 0  . meclizine (ANTIVERT) 25 MG tablet Take 0.5-1 tablets (12.5-25 mg total) by mouth 3 (three) times daily as needed for dizziness or nausea. 30 tablet 0  .  meloxicam (MOBIC) 7.5 MG tablet 1 bid prn pain 60 tablet 0   No facility-administered medications prior to visit.     ROS Review of Systems  Respiratory: Negative.   Cardiovascular: Negative.   Gastrointestinal: Negative.   Musculoskeletal: Positive for myalgias.  Skin: Positive for rash.  Neurological: Positive for weakness (left arm).  Psychiatric/Behavioral: Negative.     Objective:  BP 130/76 (BP Location: Left Arm, Patient Position: Sitting, Cuff Size: Normal)   Pulse 98   Temp 98.1 F (36.7 C) (Oral)   Resp 18   Ht 5' (1.524 m)   Wt 98 lb 6.4 oz (44.6 kg)   SpO2 100%   BMI 19.22 kg/m   BP/Weight 04/10/2017 03/29/2017 3/41/9622  Systolic BP 297 989 -  Diastolic BP 76 76 -  Wt. (Lbs) 98.4 - 99  BMI 19.22 - 19.02     Physical Exam  Eyes: Conjunctivae are normal. Pupils are equal, round, and reactive to light.  Neck: No JVD present.  Cardiovascular: Normal rate, regular rhythm, normal heart sounds and intact distal pulses.   Pulmonary/Chest: Effort normal and breath sounds normal.  Abdominal: Soft. Bowel sounds are normal.  Musculoskeletal:       Left shoulder: She exhibits decreased range of motion and decreased strength.       Left hand: Decreased strength noted.  Skin: Skin is warm and dry. Rash (BUE and neck) noted.  Nursing note and vitals reviewed.   Assessment & Plan:   Problem List Items Addressed This Visit      Cardiovascular and  Mediastinum   Essential hypertension - Primary   Relevant Medications   amLODipine (NORVASC) 10 MG tablet   losartan (COZAAR) 25 MG tablet   atorvastatin (LIPITOR) 40 MG tablet   Other Relevant Orders   POCT UA - Microalbumin (Completed)     Other   Prediabetes   Relevant Medications   atorvastatin (LIPITOR) 40 MG tablet   Other Relevant Orders   Glucose (CBG) (Completed)    Other Visit Diagnoses    Mixed hyperlipidemia       Relevant Medications   amLODipine (NORVASC) 10 MG tablet   losartan (COZAAR) 25 MG  tablet   atorvastatin (LIPITOR) 40 MG tablet   History of CVA (cerebrovascular accident)       Relevant Orders   Ambulatory referral to Physical Therapy   Ambulatory referral to Neurology      Meds ordered this encounter  Medications  . TRUEPLUS LANCETS 28G MISC    Sig: 1 kit by Does not apply route once.    Dispense:  100 each    Refill:  12    Order Specific Question:   Supervising Provider    Answer:   Tresa Garter [id number]  . Blood Glucose Monitoring Suppl (TRUE METRIX METER) w/Device KIT    Sig: 1 Device by Does not apply route once.    Dispense:  1 kit    Refill:  0    Order Specific Question:   Supervising Provider    Answer:   Tresa Garter [id number]  . glucose blood test strip    Sig: Use as instructed    Dispense:  100 each    Refill:  12    Order Specific Question:   Supervising Provider    Answer:   Tresa Garter [[id number]]  . amLODipine (NORVASC) 10 MG tablet    Sig: Take 1 tablet (10 mg total) by mouth daily.    Dispense:  90 tablet    Refill:  0    Order Specific Question:   Supervising Provider    Answer:   Tresa Garter [id number]  . losartan (COZAAR) 25 MG tablet    Sig: Take 1 tablet (25 mg total) by mouth daily.    Dispense:  90 tablet    Refill:  0    Order Specific Question:   Supervising Provider    Answer:   Tresa Garter [id number]  . atorvastatin (LIPITOR) 40 MG tablet    Sig: Take 1 tablet (40 mg total) by mouth daily at 6 PM.    Dispense:  90 tablet    Refill:  0    Order Specific Question:   Supervising Provider    Answer:   Tresa Garter [[id number]]    Follow-up: Return in about 2 weeks (around 04/24/2017) for DM/ HTN .   Alfonse Spruce FNP

## 2017-04-10 NOTE — Patient Instructions (Signed)
Apply orange to complete referral process. Start taking blood sugars twice a day.   Prediabetes Prediabetes is the condition of having a blood sugar (blood glucose) level that is higher than it should be, but not high enough for you to be diagnosed with type 2 diabetes. Having prediabetes puts you at risk for developing type 2 diabetes (type 2 diabetes mellitus). Prediabetes may be called impaired glucose tolerance or impaired fasting glucose. Prediabetes usually does not cause symptoms. Your health care provider can diagnose this condition with blood tests. You may be tested for prediabetes if you are overweight and if you have at least one other risk factor for prediabetes. Risk factors for prediabetes include:  Having a family member with type 2 diabetes.  Being overweight or obese.  Being older than age 73.  Being of American-Indian, African-American, Hispanic/Latino, or Asian/Pacific Islander descent.  Having an inactive (sedentary) lifestyle.  Having a history of gestational diabetes or polycystic ovarian syndrome (PCOS).  Having low levels of good cholesterol (HDL-C) or high levels of blood fats (triglycerides).  Having high blood pressure. What is blood glucose and how is blood glucose measured?   Blood glucose refers to the amount of glucose in your bloodstream. Glucose comes from eating foods that contain sugars and starches (carbohydrates) that the body breaks down into glucose. Your blood glucose level may be measured in mg/dL (milligrams per deciliter) or mmol/L (millimoles per liter).Your blood glucose may be checked with one or more of the following blood tests:  A fasting blood glucose (FBG) test. You will not be allowed to eat (you will fast) for at least 8 hours before a blood sample is taken.  A normal range for FBG is 70-100 mg/dl (3.9-5.6 mmol/L).  An A1c (hemoglobin A1c) blood test. This test provides information about blood glucose control over the previous  2?36months.  An oral glucose tolerance test (OGTT). This test measures your blood glucose twice:  After fasting. This is your baseline level.  Two hours after you drink a beverage that contains glucose. You may be diagnosed with prediabetes:  If your FBG is 100?125 mg/dL (5.6-6.9 mmol/L).  If your A1c level is 5.7?6.4%.  If your OGGT result is 140?199 mg/dL (7.8-11 mmol/L). These blood tests may be repeated to confirm your diagnosis. What happens if blood glucose is too high? The pancreas produces a hormone (insulin) that helps move glucose from the bloodstream into cells. When cells in the body do not respond properly to insulin that the body makes (insulin resistance), excess glucose builds up in the blood instead of going into cells. As a result, high blood glucose (hyperglycemia) can develop, which can cause many complications. This is a symptom of prediabetes. What can happen if blood glucose stays higher than normal for a long time? Having high blood glucose for a long time is dangerous. Too much glucose in your blood can damage your nerves and blood vessels. Long-term damage can lead to complications from diabetes, which may include:  Heart disease.  Stroke.  Blindness.  Kidney disease.  Depression.  Poor circulation in the feet and legs, which could lead to surgical removal (amputation) in severe cases. How can prediabetes be prevented from turning into type 2 diabetes?   To help prevent type 2 diabetes, take the following actions:  Be physically active.  Do moderate-intensity physical activity for at least 30 minutes on at least 5 days of the week, or as much as told by your health care provider. This could be  brisk walking, biking, or water aerobics.  Ask your health care provider what activities are safe for you. A mix of physical activities may be best, such as walking, swimming, cycling, and strength training.  Lose weight as told by your health care  provider.  Losing 5-7% of your body weight can reverse insulin resistance.  Your health care provider can determine how much weight loss is best for you and can help you lose weight safely.  Follow a healthy meal plan. This includes eating lean proteins, complex carbohydrates, fresh fruits and vegetables, low-fat dairy products, and healthy fats.  Follow instructions from your health care provider about eating or drinking restrictions.  Make an appointment to see a diet and nutrition specialist (registered dietitian) to help you create a healthy eating plan that is right for you.  Do not smoke or use any tobacco products, such as cigarettes, chewing tobacco, and e-cigarettes. If you need help quitting, ask your health care provider.  Take over-the-counter and prescription medicines as told by your health care provider. You may be prescribed medicines that help lower the risk of type 2 diabetes. This information is not intended to replace advice given to you by your health care provider. Make sure you discuss any questions you have with your health care provider. Document Released: 03/11/2016 Document Revised: 04/25/2016 Document Reviewed: 01/09/2016 Elsevier Interactive Patient Education  2017 [employer].

## 2017-04-10 NOTE — Patient Outreach (Signed)
[facility]) Care Management  04/10/2017  Aprel Egelhoff 08-01-1956 [id number]   EMMI- STROKE RED ON EMMI ALERT Day # 6 Date: 04/07/17 Red Alert Reason: "Smoked or been around smoke? Yes"    Outreach attempt #3 to patient. No answer at present. RN CM has made multiple attempts to establish contact with patient without success. Case is being closed at this time.    Plan: RN CM will notify [facility] administrative assistant of case status.   Enzo Montgomery, RN,BSN,CCM [place] Management Telephonic Care Management Coordinator Direct Phone: [phone] Toll Free: [phone] Fax: [phone]

## 2017-04-11 MED FILL — TRUE METRIX BLOOD GLUCOSE M: W/DEVICE | 30 days supply | Qty: 1 | Fill #0

## 2017-04-11 MED FILL — TRUE METRIX TEST STRIP: 30 days supply | Qty: 100 | Fill #0

## 2017-04-11 MED FILL — TRUEplus LANCETS 28G MISC: 30 days supply | Qty: 100 | Fill #0

## 2017-04-16 ENCOUNTER — Ambulatory Visit (HOSPITAL_COMMUNITY)
Admission: RE | Admit: 2017-04-16 | Discharge: 2017-04-16 | Disposition: A | Discharge status: Home / Self Care | Payer: Self-pay | Source: Ambulatory Visit | Attending: Interventional Radiology | Admitting: Interventional Radiology

## 2017-04-16 ENCOUNTER — Other Ambulatory Visit (HOSPITAL_COMMUNITY): Payer: Self-pay | Admitting: Interventional Radiology

## 2017-04-16 ENCOUNTER — Ambulatory Visit (INDEPENDENT_AMBULATORY_CARE_PROVIDER_SITE_OTHER): Payer: Self-pay | Admitting: *Deleted

## 2017-04-16 DIAGNOSIS — I729 Aneurysm of unspecified site: Secondary | ICD-10-CM

## 2017-04-16 DIAGNOSIS — I671 Cerebral aneurysm, nonruptured: Secondary | ICD-10-CM

## 2017-04-16 DIAGNOSIS — I771 Stricture of artery: Secondary | ICD-10-CM

## 2017-04-16 DIAGNOSIS — I639 Cerebral infarction, unspecified: Secondary | ICD-10-CM

## 2017-04-16 HISTORY — PX: IR RADIOLOGIST EVAL & MGMT: IMG5224

## 2017-04-16 LAB — CUP PACEART INCLINIC DEVICE CHECK
Date Time Interrogation Session: 20180516111931
Implantable Pulse Generator Implant Date: 20180427

## 2017-04-16 NOTE — Progress Notes (Signed)
Wound check appointment. Steri-strips previously removed by patient.. Wound without redness or edema. Incision edges approximated, wound well healed. Normal device function. Battery status: good. R-waves 1.31mV. No symptom, tachy, pause, brady, or AF episodes. Patient educated about Carelink monitor. Monthly summary reports and ROV with JA PRN.

## 2017-04-18 ENCOUNTER — Encounter (HOSPITAL_COMMUNITY): Payer: Self-pay | Admitting: Interventional Radiology

## 2017-04-22 ENCOUNTER — Other Ambulatory Visit: Payer: Self-pay | Admitting: General Surgery

## 2017-04-22 ENCOUNTER — Other Ambulatory Visit: Payer: Self-pay | Admitting: Radiology

## 2017-04-23 ENCOUNTER — Encounter (HOSPITAL_COMMUNITY): Payer: Self-pay

## 2017-04-23 ENCOUNTER — Other Ambulatory Visit (HOSPITAL_COMMUNITY): Payer: Self-pay | Admitting: Interventional Radiology

## 2017-04-23 ENCOUNTER — Ambulatory Visit (HOSPITAL_COMMUNITY)
Admission: RE | Admit: 2017-04-23 | Discharge: 2017-04-23 | Disposition: A | Discharge status: Home / Self Care | Payer: Self-pay | Source: Ambulatory Visit | Attending: Interventional Radiology | Admitting: Interventional Radiology

## 2017-04-23 DIAGNOSIS — I651 Occlusion and stenosis of basilar artery: Secondary | ICD-10-CM | POA: Insufficient documentation

## 2017-04-23 DIAGNOSIS — I671 Cerebral aneurysm, nonruptured: Secondary | ICD-10-CM | POA: Insufficient documentation

## 2017-04-23 DIAGNOSIS — H269 Unspecified cataract: Secondary | ICD-10-CM | POA: Insufficient documentation

## 2017-04-23 DIAGNOSIS — I10 Essential (primary) hypertension: Secondary | ICD-10-CM | POA: Insufficient documentation

## 2017-04-23 DIAGNOSIS — F17210 Nicotine dependence, cigarettes, uncomplicated: Secondary | ICD-10-CM | POA: Insufficient documentation

## 2017-04-23 DIAGNOSIS — Z8673 Personal history of transient ischemic attack (TIA), and cerebral infarction without residual deficits: Secondary | ICD-10-CM | POA: Insufficient documentation

## 2017-04-23 DIAGNOSIS — R42 Dizziness and giddiness: Secondary | ICD-10-CM | POA: Insufficient documentation

## 2017-04-23 DIAGNOSIS — I771 Stricture of artery: Secondary | ICD-10-CM

## 2017-04-23 DIAGNOSIS — H409 Unspecified glaucoma: Secondary | ICD-10-CM | POA: Insufficient documentation

## 2017-04-23 DIAGNOSIS — I6502 Occlusion and stenosis of left vertebral artery: Secondary | ICD-10-CM | POA: Insufficient documentation

## 2017-04-23 HISTORY — PX: IR ANGIO INTRA EXTRACRAN SEL COM CAROTID INNOMINATE BILAT MOD SED: IMG5360

## 2017-04-23 HISTORY — PX: IR ANGIO VERTEBRAL SEL VERTEBRAL BILAT MOD SED: IMG5369

## 2017-04-23 LAB — BASIC METABOLIC PANEL
ANION GAP: 11 (ref 5–15)
BUN: 11 mg/dL (ref 6–20)
CALCIUM: 9.4 mg/dL (ref 8.9–10.3)
CO2: 26 mmol/L (ref 22–32)
CREATININE: 0.77 mg/dL (ref 0.44–1.00)
Chloride: 100 mmol/L — ABNORMAL LOW (ref 101–111)
GFR calc non Af Amer: >60 mL/min (ref >60)
GLUCOSE: 94 mg/dL (ref 65–99)
Potassium: 3.8 mmol/L (ref 3.5–5.1)
Sodium: 137 mmol/L (ref 135–145)

## 2017-04-23 LAB — CBC
HCT: 31.6 % — ABNORMAL LOW (ref 36.0–46.0)
Hemoglobin: 10 g/dL — ABNORMAL LOW (ref 12.0–15.0)
MCH: 29.2 pg (ref 26.0–34.0)
MCHC: 31.6 g/dL (ref 30.0–36.0)
MCV: 92.4 fL (ref 78.0–100.0)
PLATELETS: 254 10*3/uL (ref 150–400)
RBC: 3.42 MIL/uL — ABNORMAL LOW (ref 3.87–5.11)
RDW: 13.9 % (ref 11.5–15.5)
WBC: 8.8 10*3/uL (ref 4.0–10.5)

## 2017-04-23 LAB — PROTIME-INR
INR: 1.06
PROTHROMBIN TIME: 13.8 s (ref 11.4–15.2)

## 2017-04-23 LAB — APTT: aPTT: 36 seconds (ref 24–36)

## 2017-04-23 MED ORDER — LIDOCAINE HCL 1 % IJ SOLN
INTRAMUSCULAR | Status: AC | PRN
  Administered 2017-04-23: 10 mL

## 2017-04-23 MED ORDER — FENTANYL CITRATE (PF) 100 MCG/2ML IJ SOLN
INTRAMUSCULAR | Status: AC | PRN
  Administered 2017-04-23: 25 ug via INTRAVENOUS

## 2017-04-23 MED ORDER — SODIUM CHLORIDE 0.9 % IV SOLN
INTRAVENOUS | Status: DC
  Administered 2017-04-23: 08:00:00 via INTRAVENOUS

## 2017-04-23 MED ORDER — LIDOCAINE HCL 1 % IJ SOLN
INTRAMUSCULAR | Status: AC
  Filled 2017-04-23: qty 20

## 2017-04-23 MED ORDER — IOPAMIDOL (ISOVUE-300) INJECTION 61%
INTRAVENOUS | Status: AC
  Administered 2017-04-23: 80 mL
  Filled 2017-04-23: qty 150

## 2017-04-23 MED ORDER — MIDAZOLAM HCL 2 MG/2ML IJ SOLN
INTRAMUSCULAR | Status: AC | PRN
  Administered 2017-04-23: 1 mg via INTRAVENOUS

## 2017-04-23 MED ORDER — SODIUM CHLORIDE 0.9 % IV SOLN: INTRAVENOUS | Status: AC

## 2017-04-23 MED ORDER — HEPARIN SODIUM (PORCINE) 1000 UNIT/ML IJ SOLN
INTRAMUSCULAR | Status: AC | PRN
  Administered 2017-04-23: 1000 [IU] via INTRAVENOUS

## 2017-04-23 MED ORDER — HEPARIN SODIUM (PORCINE) 1000 UNIT/ML IJ SOLN
INTRAMUSCULAR | Status: AC
  Filled 2017-04-23: qty 2

## 2017-04-23 MED ORDER — MIDAZOLAM HCL 2 MG/2ML IJ SOLN
INTRAMUSCULAR | Status: AC
  Filled 2017-04-23: qty 2

## 2017-04-23 MED ORDER — SODIUM CHLORIDE 0.9 % IV SOLN
INTRAVENOUS | Status: AC | PRN
  Administered 2017-04-23: 75 mL/h via INTRAVENOUS

## 2017-04-23 MED ORDER — FENTANYL CITRATE (PF) 100 MCG/2ML IJ SOLN
INTRAMUSCULAR | Status: AC
  Filled 2017-04-23: qty 2

## 2017-04-23 NOTE — H&P (Signed)
Chief Complaint: basilar artery stenosis, R MCA aneurysm x2  Referring Physician:Dr. Rosalin Hawking  Supervising Physician: Luanne Bras  Patient Status: [facility] - Out-pt  HPI: Amy Howell is a 61 y.o. female who was recently admitted for an embolic CVA.  She is currently in the process of wearing a heart monitor etc to try and determine a source for her CVA.  She has been referred to Dr. Estanislado Pandy secondary to findings of R MCA aneurysms and basilar artery stenosis.  She saw him a couple of weeks ago and plans for a cerebral angiogram to further evaluate these areas of concern today.  She has no new complaints since that time.  She does state she has blurry vision since the CVA, but the opthalmologic thinks this is related to cataracts and glaucoma.  She does have some residual left arm pain since the CVA as well.    Past Medical History:  Past Medical History:  Diagnosis Date  . Hypertension   . Vertigo     Past Surgical History:  Past Surgical History:  Procedure Laterality Date  . ABDOMINAL HYSTERECTOMY    . gum operation    . IR RADIOLOGIST EVAL & MGMT  04/16/2017  . LOOP RECORDER INSERTION N/A 03/28/2017   Procedure: Loop Recorder Insertion;  Surgeon: Thompson Grayer, MD;  Location: [place] [facility];  Service: Cardiovascular;  Laterality: N/A;  . TEE WITHOUT CARDIOVERSION N/A 03/28/2017   Procedure: TRANSESOPHAGEAL ECHOCARDIOGRAM (TEE);  Surgeon: Lelon Perla, MD;  Location: [facility] ENDOSCOPY;  Service: Cardiovascular;  Laterality: N/A;    Family History:  Family History  Problem Relation Age of Onset  . Hypertension Father   . Diabetes Father     Social History:  reports that she has been smoking.  She has never used smokeless tobacco. She reports that she does not drink alcohol or use drugs.  Allergies: No Known Allergies  Medications: Medications reviewed in epic  Please HPI for pertinent positives, otherwise complete 10 system ROS negative.  Mallampati  Score: MD Evaluation Airway: WNL Heart: WNL Abdomen: WNL Chest/ Lungs: WNL ASA  Classification: 2 Mallampati/Airway Score: One  Physical Exam: BP 128/72   Pulse 77   Temp 98 F (36.7 C) (Oral)   Resp 16   SpO2 100%  There is no height or weight on file to calculate BMI. General: pleasant, WD, WN black female who is laying in bed in NAD HEENT: head is normocephalic, atraumatic.  Sclera are noninjected.  PERRL.  Ears and nose without any masses or lesions.  Mouth is pink and moist Heart: regular, rate, and rhythm.  Normal s1,s2. No obvious murmurs, gallops, or rubs noted.  Palpable radial pulses bilaterally Lungs: CTAB, no wheezes, rhonchi, or rales noted.  Respiratory effort nonlabored Abd: soft, NT, ND, +BS, no masses, hernias, or organomegaly Psych: A&Ox3 with an appropriate affect.   Labs: Results for orders placed or performed during the hospital encounter of 04/23/17 (from the past 48 hour(s))  APTT     Status: None   Collection Time: 04/23/17  7:07 AM  Result Value Ref Range   aPTT 36 24 - 36 seconds  CBC     Status: Abnormal   Collection Time: 04/23/17  7:07 AM  Result Value Ref Range   WBC 8.8 4.0 - 10.5 K/uL   RBC 3.42 (L) 3.87 - 5.11 MIL/uL   Hemoglobin 10.0 (L) 12.0 - 15.0 g/dL   HCT 31.6 (L) 36.0 - 46.0 %   MCV 92.4  78.0 - 100.0 fL   MCH 29.2 26.0 - 34.0 pg   MCHC 31.6 30.0 - 36.0 g/dL   RDW 13.9 11.5 - 15.5 %   Platelets 254 150 - 400 K/uL  Protime-INR     Status: None   Collection Time: 04/23/17  7:07 AM  Result Value Ref Range   Prothrombin Time 13.8 11.4 - 15.2 seconds   INR 1.06     Imaging: No results found.  Assessment/Plan 1. Basilar artery stenosis, R MCA aneurysms x2 We will plan to proceed with a diagnostic cerebral angiogram today to better evaluate these areas of concern.   -labs and vitals have been reviewed, BMET is still pending. -Risks and Benefits discussed with the patient including, but not limited to bleeding, infection, vascular  injury or contrast induced renal failure. All of the patient's questions were answered, patient is agreeable to proceed. Consent signed and in chart.   Thank you for this interesting consult.  I greatly enjoyed meeting Breionna Punt and look forward to participating in their care.  A copy of this report was sent to the requesting provider on this date.  Electronically Signed: Henreitta Cea 04/23/2017, 8:05 AM   I spent a total of    25 Minutes in face to face in clinical consultation, greater than 50% of which was counseling/coordinating care for basilar artery stenosis, R MCA aneurysm x2

## 2017-04-23 NOTE — Discharge Instructions (Addendum)
Cerebral Angiogram, Care After Refer to this sheet in the next few weeks. These instructions provide you with information on caring for yourself after your procedure. Your health care provider may also give you more specific instructions. Your treatment has been planned according to current medical practices, but problems sometimes occur. Call your health care provider if you have any problems or questions after your procedure. What can I expect after the procedure? After your procedure, it is typical to have the following:  Bruising at the catheter insertion site that usually fades within 1-2 weeks.  Blood collecting in the tissue (hematoma) that may be painful to the touch. It should usually decrease in size and tenderness within 1-2 weeks.  A mild headache. Follow these instructions at home:  Take medicines only as directed by your health care provider.  You may shower 24-48 hours after the procedure or as directed by your health care provider. Remove the bandage (dressing) and gently wash the site with plain soap and water. Pat the area dry with a clean towel. Do not rub the site, because this may cause bleeding.  Do not take baths, swim, or use a hot tub until your health care provider approves.  Check your insertion site every day for redness, swelling, or drainage.  Do not apply powder or lotion to the site.  Do not lift over 10 lb (4.5 kg) for 5 days after your procedure or as directed by your health care provider.  Ask your health care provider when it is okay to:  Return to work or school.  Resume usual physical activities or sports.  Resume sexual activity.  Do not drive home if you are discharged the same day as the procedure. Have someone else drive you.  You may drive 24 hours after the procedure unless otherwise instructed by your health care provider.  Do not operate machinery or power tools for 24 hours after the procedure or as directed by your health care  provider.  If your procedure was done as an outpatient procedure, which means that you went home the same day as your procedure, a responsible adult should be with you for the first 24 hours after you arrive home.  Keep all follow-up visits as directed by your health care provider. This is important. Contact a health care provider if:  You have a fever.  You have chills.  You have increased bleeding from the catheter insertion site. Hold pressure on the site. CALL 911 Get help right away if:  You have vision changes or loss of vision.  You have numbness or weakness on one side of your body.  You have difficulty talking, or you have slurred speech or cannot speak (aphasia).  You feel confused or have difficulty remembering.  You have unusual pain at the catheter insertion site.  You have redness, warmth, or swelling at the catheter insertion site.  You have drainage (other than a small amount of blood on the dressing) from the catheter insertion site.  The catheter insertion site is bleeding, and the bleeding does not stop after 30 minutes of holding steady pressure on the site. These symptoms may represent a serious problem that is an emergency. Do not wait to see if the symptoms will go away. Get medical help right away. Call your local emergency services (911 in [place] Do not drive yourself to the hospital. This information is not intended to replace advice given to you by your health care provider. Make sure you discuss any  questions you have with your health care provider. Document Released: 04/04/2014 Document Revised: 04/25/2016 Document Reviewed: 12/01/2013 Elsevier Interactive Patient Education  2017 [place]. Moderate Conscious Sedation, Adult, Care After These instructions provide you with information about caring for yourself after your procedure. Your health care provider may also give you more specific instructions. Your treatment has been planned according to  current medical practices, but problems sometimes occur. Call your health care provider if you have any problems or questions after your procedure. What can I expect after the procedure? After your procedure, it is common:  To feel sleepy for several hours.  To feel clumsy and have poor balance for several hours.  To have poor judgment for several hours.  To vomit if you eat too soon. Follow these instructions at home: For at least 24 hours after the procedure:    Do not:  Participate in activities where you could fall or become injured.  Drive.  Use heavy machinery.  Drink alcohol.  Take sleeping pills or medicines that cause drowsiness.  Make important decisions or sign legal documents.  Take care of children on your own.  Rest. Eating and drinking   Follow the diet recommended by your health care provider.  If you vomit:  Drink water, juice, or soup when you can drink without vomiting.  Make sure you have little or no nausea before eating solid foods. General instructions   Have a responsible adult stay with you until you are awake and alert.  Take over-the-counter and prescription medicines only as told by your health care provider.  If you smoke, do not smoke without supervision.  Keep all follow-up visits as told by your health care provider. This is important. Contact a health care provider if:  You keep feeling nauseous or you keep vomiting.  You feel light-headed.  You develop a rash.  You have a fever. Get help right away if:  You have trouble breathing. This information is not intended to replace advice given to you by your health care provider. Make sure you discuss any questions you have with your health care provider. Document Released: 09/08/2013 Document Revised: 04/22/2016 Document Reviewed: 03/09/2016 Elsevier Interactive Patient Education  2017 [employer].

## 2017-04-23 NOTE — Sedation Documentation (Addendum)
Sheath to right groin removed. V-pad applied

## 2017-04-23 NOTE — Procedures (Signed)
S/P 4 vessel cerebral arteiogram RT CFA approach. Findings. 1.5.2mmx 4.41mm prox RT MCA aneurysm,and 4.46mmx 3.50mm distal RT MCA aneurysm. 2.SEvere distal basilar artery ,and  Severe mid basilar stenosis.

## 2017-04-24 MED FILL — CLOPIDOGREL 75 MG TABLET: 75 | 30 days supply | Qty: 30 | Fill #0

## 2017-04-24 MED FILL — LATANOPROST 0.005% EYE DRP: 0.005 | 30 days supply | Qty: 3 | Fill #0

## 2017-04-29 ENCOUNTER — Ambulatory Visit (INDEPENDENT_AMBULATORY_CARE_PROVIDER_SITE_OTHER): Payer: Self-pay | Admitting: *Deleted

## 2017-04-29 DIAGNOSIS — I639 Cerebral infarction, unspecified: Secondary | ICD-10-CM

## 2017-04-30 ENCOUNTER — Encounter (HOSPITAL_COMMUNITY): Payer: Self-pay | Admitting: Interventional Radiology

## 2017-04-30 ENCOUNTER — Ambulatory Visit: Payer: Self-pay | Attending: Family Medicine | Admitting: Family Medicine

## 2017-04-30 VITALS — BP 127/66 | HR 73 | Temp 98.4°F | Resp 18 | Ht 60.0 in | Wt 94.2 lb

## 2017-04-30 DIAGNOSIS — Z7982 Long term (current) use of aspirin: Secondary | ICD-10-CM | POA: Insufficient documentation

## 2017-04-30 DIAGNOSIS — R7303 Prediabetes: Secondary | ICD-10-CM

## 2017-04-30 DIAGNOSIS — E119 Type 2 diabetes mellitus without complications: Secondary | ICD-10-CM | POA: Insufficient documentation

## 2017-04-30 DIAGNOSIS — Z79899 Other long term (current) drug therapy: Secondary | ICD-10-CM | POA: Insufficient documentation

## 2017-04-30 DIAGNOSIS — E782 Mixed hyperlipidemia: Secondary | ICD-10-CM | POA: Insufficient documentation

## 2017-04-30 DIAGNOSIS — I6930 Unspecified sequelae of cerebral infarction: Secondary | ICD-10-CM | POA: Insufficient documentation

## 2017-04-30 DIAGNOSIS — Z7902 Long term (current) use of antithrombotics/antiplatelets: Secondary | ICD-10-CM | POA: Insufficient documentation

## 2017-04-30 DIAGNOSIS — I10 Essential (primary) hypertension: Secondary | ICD-10-CM | POA: Insufficient documentation

## 2017-04-30 DIAGNOSIS — E785 Hyperlipidemia, unspecified: Secondary | ICD-10-CM | POA: Insufficient documentation

## 2017-04-30 LAB — GLUCOSE, POCT (MANUAL RESULT ENTRY): POC Glucose: 69 mg/dl — AB (ref 70–99)

## 2017-04-30 MED ORDER — AMLODIPINE BESYLATE 10 MG PO TABS: 10.0000 mg | ORAL_TABLET | Freq: Every day | ORAL | 0 refills | Status: DC

## 2017-04-30 MED ORDER — ATORVASTATIN CALCIUM 40 MG PO TABS: 40.0000 mg | ORAL_TABLET | Freq: Every day | ORAL | 0 refills | Status: DC

## 2017-04-30 MED FILL — AMLODIPINE BESYLATE 10 MG T: 10 | 30 days supply | Qty: 30 | Fill #0

## 2017-04-30 MED FILL — ?ATORVASTATIN 40MG TABLET: 40 | 30 days supply | Qty: 30 | Fill #0

## 2017-04-30 NOTE — Patient Instructions (Signed)
Heart-Healthy Eating Plan °Heart-healthy meal planning includes: °· Limiting unhealthy fats. °· Increasing healthy fats. °· Making other small dietary changes. °You may need to talk with your doctor or a diet specialist (dietitian) to create an eating plan that is right for you. °What types of fat should I choose? °· Choose healthy fats. These include olive oil and canola oil, flaxseeds, walnuts, almonds, and seeds. °· Eat more omega-3 fats. These include salmon, mackerel, sardines, tuna, flaxseed oil, and ground flaxseeds. Try to eat fish at least twice each week. °· Limit saturated fats. °¨ Saturated fats are often found in animal products, such as meats, butter, and cream. °¨ Plant sources of saturated fats include palm oil, palm kernel oil, and coconut oil. °· Avoid foods with partially hydrogenated oils in them. These include stick margarine, some tub margarines, cookies, crackers, and other baked goods. These contain trans fats. °What general guidelines do I need to follow? °· Check food labels carefully. Identify foods with trans fats or high amounts of saturated fat. °· Fill one half of your plate with vegetables and green salads. Eat 4-5 servings of vegetables per day. A serving of vegetables is: °¨ 1 cup of raw leafy vegetables. °¨ ½ cup of raw or cooked cut-up vegetables. °¨ ½ cup of vegetable juice. °· Fill one fourth of your plate with whole grains. Look for the word "whole" as the first word in the ingredient list. °· Fill one fourth of your plate with lean protein foods. °· Eat 4-5 servings of fruit per day. A serving of fruit is: °¨ One medium whole fruit. °¨ ¼ cup of dried fruit. °¨ ½ cup of fresh, frozen, or canned fruit. °¨ ½ cup of 100% fruit juice. °· Eat more foods that contain soluble fiber. These include apples, broccoli, carrots, beans, peas, and barley. Try to get 20-30 g of fiber per day. °· Eat more home-cooked food. Eat less restaurant, buffet, and fast food. °· Limit or avoid  alcohol. °· Limit foods high in starch and sugar. °· Avoid fried foods. °· Avoid frying your food. Try baking, boiling, grilling, or broiling it instead. You can also reduce fat by: °¨ Removing the skin from poultry. °¨ Removing all visible fats from meats. °¨ Skimming the fat off of stews, soups, and gravies before serving them. °¨ Steaming vegetables in water or broth. °· Lose weight if you are overweight. °· Eat 4-5 servings of nuts, legumes, and seeds per week: °¨ One serving of dried beans or legumes equals ½ cup after being cooked. °¨ One serving of nuts equals 1½ ounces. °¨ One serving of seeds equals ½ ounce or one tablespoon. °· You may need to keep track of how much salt or sodium you eat. This is especially true if you have high blood pressure. Talk with your doctor or dietitian to get more information. °What foods can I eat? °Grains  °Breads, including French, white, pita, wheat, raisin, rye, oatmeal, and Italian. Tortillas that are neither fried nor made with lard or trans fat. Low-fat rolls, including hotdog and hamburger buns and English muffins. Biscuits. Muffins. Waffles. Pancakes. Light popcorn. Whole-grain cereals. Flatbread. Melba toast. Pretzels. Breadsticks. Rusks. Low-fat snacks. Low-fat crackers, including oyster, saltine, matzo, graham, animal, and rye. Rice and pasta, including brown rice and pastas that are made with whole wheat. °Vegetables  °All vegetables. °Fruits  °All fruits, but limit coconut. °Meats and Other Protein Sources  °Lean, well-trimmed beef, veal, pork, and lamb. Chicken and turkey without skin. All   fish and shellfish. Wild duck, rabbit, pheasant, and venison. Egg whites or low-cholesterol egg substitutes. Dried beans, peas, lentils, and tofu. Seeds and most nuts. °Dairy  °Low-fat or nonfat cheeses, including ricotta, string, and mozzarella. Skim or 1% milk that is liquid, powdered, or evaporated. Buttermilk that is made with low-fat milk. Nonfat or low-fat  yogurt. °Beverages  °Mineral water. Diet carbonated beverages. °Sweets and Desserts  °Sherbets and fruit ices. Honey, jam, marmalade, jelly, and syrups. Meringues and gelatins. Pure sugar candy, such as hard candy, jelly beans, gumdrops, mints, marshmallows, and small amounts of dark chocolate. Angel food cake. °Eat all sweets and desserts in moderation. °Fats and Oils  °Nonhydrogenated (trans-free) margarines. Vegetable oils, including soybean, sesame, sunflower, olive, peanut, safflower, corn, canola, and cottonseed. Salad dressings or mayonnaise made with a vegetable oil. Limit added fats and oils that you use for cooking, baking, salads, and as spreads. °Other  °Cocoa powder. Coffee and tea. All seasonings and condiments. °The items listed above may not be a complete list of recommended foods or beverages. Contact your dietitian for more options.  °What foods are not recommended? °Grains  °Breads that are made with saturated or trans fats, oils, or whole milk. Croissants. Butter rolls. Cheese breads. Sweet rolls. Donuts. Buttered popcorn. Chow mein noodles. High-fat crackers, such as cheese or butter crackers. °Meats and Other Protein Sources  °Fatty meats, such as hotdogs, short ribs, sausage, spareribs, bacon, rib eye roast or steak, and mutton. High-fat deli meats, such as salami and bologna. Caviar. Domestic duck and goose. Organ meats, such as kidney, liver, sweetbreads, and heart. °Dairy  °Cream, sour cream, cream cheese, and creamed cottage cheese. Whole-milk cheeses, including blue (bleu), Monterey Jack, Brie, [place], American, Havarti, Swiss, cheddar, Camembert, and [place]. Whole or 2% milk that is liquid, evaporated, or condensed. Whole buttermilk. Cream sauce or high-fat cheese sauce. Yogurt that is made from whole milk. °Beverages  °Regular sodas and juice drinks with added sugar. °Sweets and Desserts  °Frosting. Pudding. Cookies. Cakes other than angel food cake. Candy that has milk chocolate or  white chocolate, hydrogenated fat, butter, coconut, or unknown ingredients. Buttered syrups. Full-fat ice cream or ice cream drinks. °Fats and Oils  °Gravy that has suet, meat fat, or shortening. Cocoa butter, hydrogenated oils, palm oil, coconut oil, palm kernel oil. These can often be found in baked products, candy, fried foods, nondairy creamers, and whipped toppings. Solid fats and shortenings, including bacon fat, salt pork, lard, and butter. Nondairy cream substitutes, such as coffee creamers and sour cream substitutes. Salad dressings that are made of unknown oils, cheese, or sour cream. °The items listed above may not be a complete list of foods and beverages to avoid. Contact your dietitian for more information.  °This information is not intended to replace advice given to you by your health care provider. Make sure you discuss any questions you have with your health care provider. °Document Released: 05/19/2012 Document Revised: 04/25/2016 Document Reviewed: 05/12/2014 °Elsevier Interactive Patient Education © 2017 Elsevier Inc. ° °

## 2017-04-30 NOTE — Progress Notes (Signed)
Patient is here for f/up  Patient complains about left arm tense Patient needs refill on amlodipine & atorvastatin

## 2017-04-30 NOTE — Progress Notes (Signed)
Subjective:  Patient ID: Amy Howell, female    DOB: [birth year]  Age: 61 y.o. MRN: [id number]  CC: Hypertension   HPI Amy Howell presents for   Hypertension: Patient here for follow-up of elevated blood pressure. She is not exercising and is adherent to low salt diet.  Blood pressure is well controlled at home. SBP at home range 92-110's and DBP 57-70's. Cardiac symptoms none. Patient denies chest pain, claudication, dyspnea, lower extremity edema, orthopnea, palpitations and syncope.  Cardiovascular risk factors: diabetes mellitus, dyslipidemia, hypertension and smoking/ tobacco exposure. Use of agents associated with hypertension: none. History of target organ damage: stroke. She reports upcoming surgical intervention and is taking plavix and aspirin. She is adherent with taking medications for HTN; and statins.   Joint/Muscle Pain: Patient complains of arthralgias for which has been present since CVA. Pain is located in the left shoulder and arm, and is described as aching, and is intermittent.  Associated symptoms include: decreased range of motion.  The patient has tried tylenol  for pain, with complete relief. She and daughter reports following up PT referral but expressed to PT wanting to start services after upcoming surgery.  Outpatient Medications Prior to Visit  Medication Sig Dispense Refill  . acetaminophen (TYLENOL) 500 MG tablet Take 1,000 mg by mouth every 6 (six) hours as needed (for pain.).    Marland Kitchen aspirin 325 MG tablet Take 1 tablet (325 mg total) by mouth daily. 30 tablet 0  . feeding supplement, ENSURE ENLIVE, (ENSURE ENLIVE) LIQD Take 237 mLs by mouth 2 (two) times daily as needed (for nutritional support).    Marland Kitchen glucose blood test strip Use as instructed (Patient not taking: Reported on 04/21/2017) 100 each 12  . latanoprost (XALATAN) 0.005 % ophthalmic solution Place 1 drop into both eyes daily.  11  . losartan (COZAAR) 25 MG tablet Take 1 tablet (25 mg total) by mouth  daily. (Patient not taking: Reported on 04/16/2017) 90 tablet 0  . meclizine (ANTIVERT) 25 MG tablet Take 0.5-1 tablets (12.5-25 mg total) by mouth 3 (three) times daily as needed for dizziness or nausea. (Patient not taking: Reported on 04/16/2017) 30 tablet 0  . meloxicam (MOBIC) 7.5 MG tablet 1 bid prn pain (Patient not taking: Reported on 04/16/2017) 60 tablet 0  . oxyCODONE-acetaminophen (PERCOCET) 5-325 MG tablet Take 1 tablet by mouth every 6 (six) hours as needed for severe pain. (Patient not taking: Reported on 04/16/2017) 10 tablet 0  . amLODipine (NORVASC) 10 MG tablet Take 1 tablet (10 mg total) by mouth daily. 90 tablet 0  . atorvastatin (LIPITOR) 40 MG tablet Take 1 tablet (40 mg total) by mouth daily at 6 PM. 90 tablet 0   No facility-administered medications prior to visit.     ROS Review of Systems  Constitutional: Negative.   Respiratory: Negative.   Cardiovascular: Negative.   Gastrointestinal: Negative.   Musculoskeletal: Positive for arthralgias.  Skin: Negative.   Neurological: Negative.     Objective:  BP 127/66 (BP Location: Left Arm, Patient Position: Sitting, Cuff Size: Normal)   Pulse 73   Temp 98.4 F (36.9 C) (Oral)   Resp 18   Ht 5' (1.524 m)   Wt 94 lb 3.2 oz (42.7 kg)   SpO2 98%   BMI 18.40 kg/m   BP/Weight 04/30/2017 04/23/2017 2/56/3893  Systolic BP 734 287 681  Diastolic BP 66 66 76  Wt. (Lbs) 94.2 - 98.4  BMI 18.4 - 19.22     Physical Exam  Constitutional: She appears well-developed and well-nourished.  Eyes: Pupils are equal, round, and reactive to light.  Neck: Normal range of motion. Neck supple. No JVD present.  Cardiovascular: Normal rate, regular rhythm, normal heart sounds and intact distal pulses.   Pulmonary/Chest: Effort normal and breath sounds normal.  Abdominal: Soft. Bowel sounds are normal.  Musculoskeletal:       Left shoulder: She exhibits decreased range of motion. She exhibits normal strength.  Skin: Skin is warm and  dry.  Psychiatric: She has a normal mood and affect.  Nursing note and vitals reviewed.  Assessment & Plan:   Problem List Items Addressed This Visit      Cardiovascular and Mediastinum   Essential hypertension - Primary   BP well controlled on current medications.   Relevant Medications   amLODipine (NORVASC) 10 MG tablet   atorvastatin (LIPITOR) 40 MG tablet     Other   Prediabetes   Relevant Orders   Glucose (CBG) (Completed)    Other Visit Diagnoses    Mixed hyperlipidemia       Relevant Medications   atorvastatin (LIPITOR) 40 MG tablet   History of CVA with residual deficit          Meds ordered this encounter  Medications  . amLODipine (NORVASC) 10 MG tablet    Sig: Take 1 tablet (10 mg total) by mouth daily.    Dispense:  90 tablet    Refill:  0    Order Specific Question:   Supervising Provider    Answer:   Tresa Garter [id number]  . atorvastatin (LIPITOR) 40 MG tablet    Sig: Take 1 tablet (40 mg total) by mouth daily at 6 PM.    Dispense:  90 tablet    Refill:  0    Order Specific Question:   Supervising Provider    Answer:   Tresa Garter [[id number]]    Follow-up: Return in about 2 months (around 06/30/2017) for HTN/HLD.   Alfonse Spruce FNP

## 2017-05-01 ENCOUNTER — Other Ambulatory Visit: Payer: Self-pay | Admitting: Radiology

## 2017-05-01 ENCOUNTER — Ambulatory Visit: Payer: Self-pay | Admitting: Physical Therapy

## 2017-05-01 LAB — CUP PACEART REMOTE DEVICE CHECK
Implantable Pulse Generator Implant Date: 20180427
MDC IDC SESS DTM: 20180527194223

## 2017-05-01 NOTE — Progress Notes (Signed)
[facility] Summary Report 

## 2017-05-05 ENCOUNTER — Encounter (HOSPITAL_COMMUNITY): Payer: Self-pay

## 2017-05-05 ENCOUNTER — Encounter (HOSPITAL_COMMUNITY)
Admission: RE | Admit: 2017-05-05 | Discharge: 2017-05-05 | Disposition: A | Discharge status: Home / Self Care | Payer: Self-pay | Source: Ambulatory Visit | Attending: Interventional Radiology | Admitting: Interventional Radiology

## 2017-05-05 DIAGNOSIS — I671 Cerebral aneurysm, nonruptured: Secondary | ICD-10-CM | POA: Insufficient documentation

## 2017-05-05 HISTORY — DX: Unspecified glaucoma: H40.9

## 2017-05-05 HISTORY — DX: Cerebral infarction, unspecified: I63.9

## 2017-05-05 HISTORY — DX: Unspecified cataract: H26.9

## 2017-05-05 HISTORY — DX: Headache, unspecified: R51.9

## 2017-05-05 HISTORY — DX: Headache: R51

## 2017-05-05 LAB — COMPREHENSIVE METABOLIC PANEL
ALBUMIN: 3.7 g/dL (ref 3.5–5.0)
ALT: 89 U/L — ABNORMAL HIGH (ref 14–54)
ANION GAP: 10 (ref 5–15)
AST: 78 U/L — AB (ref 15–41)
Alkaline Phosphatase: 400 U/L — ABNORMAL HIGH (ref 38–126)
BILIRUBIN TOTAL: 0.6 mg/dL (ref 0.3–1.2)
BUN: 14 mg/dL (ref 6–20)
CO2: 24 mmol/L (ref 22–32)
Calcium: 9.7 mg/dL (ref 8.9–10.3)
Chloride: 104 mmol/L (ref 101–111)
Creatinine, Ser: 0.77 mg/dL (ref 0.44–1.00)
GFR calc Af Amer: >60 mL/min (ref >60)
GFR calc non Af Amer: >60 mL/min (ref >60)
GLUCOSE: 83 mg/dL (ref 65–99)
POTASSIUM: 3.9 mmol/L (ref 3.5–5.1)
SODIUM: 138 mmol/L (ref 135–145)
Total Protein: 7.9 g/dL (ref 6.5–8.1)

## 2017-05-05 LAB — CBC WITH DIFFERENTIAL/PLATELET
BASOS ABS: 0 10*3/uL (ref 0.0–0.1)
BASOS PCT: 0 %
EOS ABS: 0.9 10*3/uL — AB (ref 0.0–0.7)
Eosinophils Relative: 13 %
HEMATOCRIT: 34 % — AB (ref 36.0–46.0)
Hemoglobin: 10.7 g/dL — ABNORMAL LOW (ref 12.0–15.0)
Lymphocytes Relative: 20 %
Lymphs Abs: 1.4 10*3/uL (ref 0.7–4.0)
MCH: 29.6 pg (ref 26.0–34.0)
MCHC: 31.5 g/dL (ref 30.0–36.0)
MCV: 93.9 fL (ref 78.0–100.0)
MONO ABS: 0.4 10*3/uL (ref 0.1–1.0)
MONOS PCT: 6 %
NEUTROS ABS: 4.4 10*3/uL (ref 1.7–7.7)
Neutrophils Relative %: 61 %
Platelets: 281 10*3/uL (ref 150–400)
RBC: 3.62 MIL/uL — ABNORMAL LOW (ref 3.87–5.11)
RDW: 13.3 % (ref 11.5–15.5)
WBC: 7.2 10*3/uL (ref 4.0–10.5)

## 2017-05-05 LAB — APTT: APTT: 29 s (ref 24–36)

## 2017-05-05 LAB — PLATELET INHIBITION P2Y12: PLATELET FUNCTION P2Y12: 217 [PRU] (ref 194–418)

## 2017-05-05 LAB — PROTIME-INR
INR: 1.06
Prothrombin Time: 13.8 seconds (ref 11.4–15.2)

## 2017-05-05 NOTE — Progress Notes (Signed)
PCP - Fredia Beets  Cardiologist - pt sees Dr. Rayann Heman for EP  EKG - 03/27/17, pt daughter reports no previous EKG that she knows of.  ECHO - 03/28/17  Patient denies shortness of breath, fever, cough and chest pain at PAT appointment  Patient verbalized understanding of instructions that were given to them at the PAT appointment. Patient was also instructed that they will need to review over the PAT instructions again at home before surgery.

## 2017-05-05 NOTE — Pre-Procedure Instructions (Signed)
    Amy Howell  05/05/2017      [id number] - [place], [place] [address]  [place] [address] Phone: [phone] Fax: [place] [place] [address] Phone: [phone] Fax: [phone]    Your procedure is scheduled on Wednesday June 6.  Report to [facility] Admitting at 7:00 A.M.  Call this number if you have problems the morning of surgery:  [phone]   Remember:  Do not eat food or drink liquids after midnight.  Take these medicines the morning of surgery with A SIP OF WATER: amlodipine (norvasc), clopidogrel (Plavix), aspirin, acetaminophen (tylenol) if needed  Prior to surgery STOP taking any Aleve, Naproxen, Ibuprofen, Motrin, Advil, Goody's, BC's, all herbal medications, fish oil, and all vitamins    Do not wear jewelry, make-up or nail polish.  Do not wear lotions, powders, or perfumes, or deoderant.  Do not shave 48 hours prior to surgery.  Men may shave face and neck.  Do not bring valuables to the hospital.  [facility] is not responsible for any belongings or valuables.  Contacts, dentures or bridgework may not be worn into surgery.  Leave your suitcase in the car.  After surgery it may be brought to your room.  For patients admitted to the hospital, discharge time will be determined by your treatment team.  Patients discharged the day of surgery will not be allowed to drive home.    Day of Surgery: Do not apply any deodorants/lotions. Please wear clean clothes to the hospital/surgery center.

## 2017-05-06 ENCOUNTER — Telehealth (HOSPITAL_COMMUNITY): Payer: Self-pay | Admitting: Radiology

## 2017-05-06 ENCOUNTER — Other Ambulatory Visit: Payer: Self-pay | Admitting: General Surgery

## 2017-05-06 ENCOUNTER — Other Ambulatory Visit: Payer: Self-pay | Admitting: Student

## 2017-05-06 NOTE — Anesthesia Preprocedure Evaluation (Addendum)
Anesthesia Evaluation  Patient identified by MRN, date of birth, ID band Patient awake    Reviewed: Allergy & Precautions, NPO status , Patient's Chart, lab work & pertinent test results  History of Anesthesia Complications Negative for: history of anesthetic complications  Airway Mallampati: II  TM Distance: >3 FB Neck ROM: Full    Dental no notable dental hx. (+) Dental Advisory Given   Pulmonary Current Smoker,    Pulmonary exam normal        Cardiovascular hypertension, Normal cardiovascular exam  Study Conclusions  - Left ventricle: Hypertrophy was noted. Systolic function was   vigorous. The estimated ejection fraction was in the range of 65%   to 70%. Wall motion was normal; there were no regional wall   motion abnormalities.   Neuro/Psych CVA    GI/Hepatic negative GI ROS, Neg liver ROS,   Endo/Other  negative endocrine ROS  Renal/GU negative Renal ROS     Musculoskeletal negative musculoskeletal ROS (+)   Abdominal   Peds  Hematology negative hematology ROS (+)   Anesthesia Other Findings Day of surgery medications reviewed with the patient.  Reproductive/Obstetrics                            Anesthesia Physical Anesthesia Plan  ASA: III  Anesthesia Plan: General   Post-op Pain Management:    Induction: Intravenous  PONV Risk Score and Plan: 1 and Ondansetron and Dexamethasone  Airway Management Planned: Oral ETT  Additional Equipment:   Intra-op Plan:   Post-operative Plan: Possible Post-op intubation/ventilation  Informed Consent: I have reviewed the patients History and Physical, chart, labs and discussed the procedure including the risks, benefits and alternatives for the proposed anesthesia with the patient or authorized representative who has indicated his/her understanding and acceptance.   Dental advisory given  Plan Discussed with: CRNA and  Anesthesiologist  Anesthesia Plan Comments:        Anesthesia Quick Evaluation

## 2017-05-06 NOTE — Progress Notes (Signed)
Anesthesia Chart Review: Patient is a 61 year old female scheduled for cerebral arteriogram with possible intervention of basilar artery stenosis and right MCA aneurysm embolization on 05/07/17 by Dr. Estanislado Pandy. Patient admitted to [facility] on 03/26/17 after evaluation by ophthalmologist Dr. Katy Fitch revealed left monocular altitudinal visual field defect in the setting of a 2 month history of intermittent bilateral eye and right face pain. CT showed subacute to chronic right occipital lobe/PCA territory infarct and old bilateral cerebellar infarcts. 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 Neurology recommended IR referral.  History includes smoking, hypertension, vertigo, headaches, glaucoma, cataracts, CVA (subacute presentation 03/26/17), loop recorder 03/28/17, hysterectomy.  - PCP (recently established) is Fredia Beets, FNP, last visit 04/30/17. - EP cardiologist is Dr. Thompson Grayer seen in consultation 03/28/17. Loop recorder (for monitoring for afib) ultimately recommended since TEE was "unrevealing" for CVA etiology. Loop recorder check on 04/27/17 showed normal device function, no new symptom episodes, tachycardia episodes, bradycardia, or pause episodes. No new a-fib episodes. - Neurologist is with [facility]. Saw Dr. Rosalin Hawking during hospitalization with follow-up with Dr. Sarina Ill scheduled for 05/12/17.  Meds include amlodipine, aspirin 325 mg, Lipitor, Plavix, losartan, Xalatan ophthalmic.  BP 138/70   Pulse 73   Temp 36.8 C   Resp 18   Ht 5' 0.5" (1.537 m)   Wt 94 lb 1.6 oz (42.7 kg)   SpO2 100%   BMI 18.08 kg/m   EKG 03/27/17: NSR, possible LAE, LVH, early repolarization. No comparison tracings available.   TEE 03/28/17: Study Conclusions - Left ventricle: Hypertrophy was noted. Systolic function was   vigorous. The estimated ejection fraction was in the range of 65%   to 70%. Wall motion was  normal; there were no regional wall   motion abnormalities. - Aortic valve: No evidence of vegetation. There was mild   regurgitation. - Mitral valve: No evidence of vegetation. - Left atrium: No evidence of thrombus in the atrial cavity or   appendage. - Atrial septum: No defect or patent foramen ovale was identified. - Tricuspid valve: No evidence of vegetation. - Pulmonic valve: No evidence of vegetation. Impressions: - Negative saline microcavitation study.  TTE 03/27/17: Study Conclusions - Left ventricle: GLLS is normal at -19.9% The cavity size was   normal. There was mild concentric hypertrophy. Systolic function   was vigorous. The estimated ejection fraction was in the range of   65% to 70%. Wall motion was normal; there were no regional wall   motion abnormalities. There was an increased relative   contribution of atrial contraction to ventricular filling.   Doppler parameters are consistent with abnormal left ventricular   relaxation (grade 1 diastolic dysfunction). - Aortic valve: Trileaflet; mildly thickened, mildly calcified   leaflets. There was mild regurgitation. - Tricuspid valve: There was mild regurgitation.  Cerebral angiogram/4V cerebral arteriogram 04/23/17: Findings. - 5.25mx 4.4258mprox RT MCA aneurysm,and 4.58m48m3.3mm69mstal RT MCA aneurysm. - Severe distal basilar artery ,and  Severe mid basilar stenosis.  CTA head/neck 03/26/17: IMPRESSION: 1. No emergent large vessel occlusion of the intracranial circulation. 2. 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 3. 3 x 4 mm inferiorly directed aneurysm arising from the bifurcation of the right middle cerebral artery. 4. 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 Both PCAs are of  normal caliber. 5. No occlusion, dissection or hemodynamically  significant stenosis of the cervical arteries.  MRI brain 03/27/17: IMPRESSION: 1. Subacute infarcts in the bilateral occipital poles. Small more acute infarcts may be superimposed. Patient has other small remote posterior circulation infarcts. 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 2. Known cerebral aneurysms.  CTA chest/abd/pelvis 03/27/17: IMPRESSION: 1. No CT findings to explain the patient's weight loss. 2. No acute cardiothoracic, abdominal or pelvic findings. 3. Three-vessel coronary arteriosclerosis. 4. Moderate colonic stool burden with colonic diverticulosis. No acute bowel inflammation. Normal appendix.  Preoperative labs noted. H/H 10.7/34.0, PLT 281. PT/PTT WNL. Glucose 83. P2y12 217. Cr 0.77, glucose 83. Alk Phos 400 (up from 81 on 03/26/17) and AST/ALT 78/89 (up from 20/10 on 03/26/17). She was started on statin therapy during CVA admission. (I did route her LFTs results to her PCP Fredia Beets for follow-up purposes.). Her p2y12 is a little elevated, so I have contacted Anderson Malta with IR (also discussed elevated LFTs). She will have Dr. Estanislado Pandy review. Defer decision to recheck to Dr. Estanislado Pandy.  Patient confirmed that she is taking ASA and Plavix at PAT. She denied chest pain, SOB, cough, fever at PAT. Recent neurology and cardiology evaluation with normal LVEF and wall motion. I reviewed above with anesthesiologist Dr. Smith Robert. If no acute symptoms then it is anticipated that she can proceed from an anesthesia standpoint.   [facility] Short Stay Center/Anesthesiology Phone [phone] 05/06/2017 2:37 PM

## 2017-05-06 NOTE — Telephone Encounter (Signed)
Called pt and spoke with her and her daughter. Per Dr. Estanislado Pandy pt will take 1 additional Plavix 75mg  tonight as her P2Y12 was elevated at 217. She will take her medications in the morning as well and we will recheck her P2Y12 in the morning prior to her surgery. JM

## 2017-05-07 ENCOUNTER — Inpatient Hospital Stay (HOSPITAL_COMMUNITY)
Admission: RE | Admit: 2017-05-07 | Discharge: 2017-05-08 | DRG: 027 | Disposition: A | Discharge status: Home / Self Care | Payer: Self-pay | Source: Ambulatory Visit | Attending: Interventional Radiology | Admitting: Interventional Radiology

## 2017-05-07 ENCOUNTER — Ambulatory Visit (HOSPITAL_COMMUNITY): Payer: Self-pay | Admitting: Vascular Surgery

## 2017-05-07 ENCOUNTER — Ambulatory Visit (HOSPITAL_COMMUNITY): Payer: Self-pay | Admitting: Certified Registered Nurse Anesthetist

## 2017-05-07 ENCOUNTER — Ambulatory Visit (HOSPITAL_COMMUNITY)
Admission: RE | Admit: 2017-05-07 | Discharge: 2017-05-07 | Disposition: A | Discharge status: Home / Self Care | Payer: Self-pay | Source: Ambulatory Visit | Attending: Interventional Radiology | Admitting: Interventional Radiology

## 2017-05-07 ENCOUNTER — Encounter (HOSPITAL_COMMUNITY)
Admission: RE | Disposition: A | Discharge status: Home / Self Care | Payer: Self-pay | Source: Ambulatory Visit | Attending: Interventional Radiology

## 2017-05-07 ENCOUNTER — Encounter (HOSPITAL_COMMUNITY): Payer: Self-pay | Admitting: *Deleted

## 2017-05-07 DIAGNOSIS — I671 Cerebral aneurysm, nonruptured: Principal | ICD-10-CM | POA: Diagnosis present

## 2017-05-07 DIAGNOSIS — I651 Occlusion and stenosis of basilar artery: Secondary | ICD-10-CM | POA: Insufficient documentation

## 2017-05-07 DIAGNOSIS — I10 Essential (primary) hypertension: Secondary | ICD-10-CM | POA: Diagnosis present

## 2017-05-07 DIAGNOSIS — Z8673 Personal history of transient ischemic attack (TIA), and cerebral infarction without residual deficits: Secondary | ICD-10-CM

## 2017-05-07 DIAGNOSIS — H409 Unspecified glaucoma: Secondary | ICD-10-CM | POA: Diagnosis present

## 2017-05-07 HISTORY — PX: IR ANGIO INTRA EXTRACRAN SEL COM CAROTID INNOMINATE UNI R MOD SED: IMG5359

## 2017-05-07 HISTORY — PX: IR TRANSCATH/EMBOLIZ: IMG695

## 2017-05-07 HISTORY — PX: IR 3D INDEPENDENT WKST: IMG2385

## 2017-05-07 HISTORY — PX: IR ANGIOGRAM SELECTIVE EACH ADDITIONAL VESSEL: IMG667

## 2017-05-07 HISTORY — PX: IR ANGIOGRAM FOLLOW UP STUDY: IMG697

## 2017-05-07 HISTORY — PX: RADIOLOGY WITH ANESTHESIA: SHX6223

## 2017-05-07 LAB — POCT ACTIVATED CLOTTING TIME
ACTIVATED CLOTTING TIME: 340 s
Activated Clotting Time: 147 seconds
Activated Clotting Time: 235 seconds
Activated Clotting Time: 340 seconds
Activated Clotting Time: 219 seconds
Activated Clotting Time: 257 seconds

## 2017-05-07 LAB — HEPARIN LEVEL (UNFRACTIONATED): Heparin Unfractionated: 1.48 IU/mL — ABNORMAL HIGH (ref 0.30–0.70)

## 2017-05-07 LAB — PLATELET INHIBITION P2Y12: Platelet Function  P2Y12: 184 [PRU] — ABNORMAL LOW (ref 194–418)

## 2017-05-07 LAB — PREPARE RBC (CROSSMATCH)

## 2017-05-07 LAB — MRSA PCR SCREENING: MRSA by PCR: NEGATIVE

## 2017-05-07 LAB — ABO/RH: ABO/RH(D): O POS

## 2017-05-07 SURGERY — RADIOLOGY WITH ANESTHESIA
Anesthesia: General

## 2017-05-07 MED ORDER — CLOPIDOGREL BISULFATE 75 MG PO TABS
75.0000 mg | ORAL_TABLET | Freq: Every day | ORAL | Status: DC
  Administered 2017-05-08: 75 mg via ORAL
  Filled 2017-05-07: qty 1

## 2017-05-07 MED ORDER — NITROGLYCERIN 1 MG/10 ML FOR IR/CATH LAB
INTRA_ARTERIAL | Status: DC
Start: 2017-05-07 — End: 2017-05-07
  Filled 2017-05-07: qty 10

## 2017-05-07 MED ORDER — LATANOPROST 0.005 % OP SOLN
1.0000 [drp] | Freq: Every day | OPHTHALMIC | Status: DC
  Administered 2017-05-07: 1 [drp] via OPHTHALMIC
  Filled 2017-05-07: qty 2.5

## 2017-05-07 MED ORDER — PROMETHAZINE HCL 25 MG/ML IJ SOLN: 6.2500 mg | INTRAMUSCULAR | Status: DC | PRN

## 2017-05-07 MED ORDER — ONDANSETRON HCL 4 MG/2ML IJ SOLN
INTRAMUSCULAR | Status: DC | PRN
  Administered 2017-05-07: 4 mg via INTRAVENOUS

## 2017-05-07 MED ORDER — IOPAMIDOL (ISOVUE-300) INJECTION 61%
INTRAVENOUS | Status: AC
  Filled 2017-05-07: qty 150

## 2017-05-07 MED ORDER — LIDOCAINE HCL (CARDIAC) 20 MG/ML IV SOLN
INTRAVENOUS | Status: DC | PRN
  Administered 2017-05-07: 100 mg via INTRAVENOUS

## 2017-05-07 MED ORDER — ONDANSETRON HCL 4 MG/2ML IJ SOLN: 4.0000 mg | Freq: Four times a day (QID) | INTRAMUSCULAR | Status: DC | PRN

## 2017-05-07 MED ORDER — LIDOCAINE HCL 1 % IJ SOLN
INTRAMUSCULAR | Status: AC
  Filled 2017-05-07: qty 20

## 2017-05-07 MED ORDER — CEFAZOLIN SODIUM-DEXTROSE 2-4 GM/100ML-% IV SOLN
INTRAVENOUS | Status: AC
  Filled 2017-05-07: qty 100

## 2017-05-07 MED ORDER — ACETAMINOPHEN 325 MG PO TABS: 650.0000 mg | ORAL_TABLET | ORAL | Status: DC | PRN

## 2017-05-07 MED ORDER — SUGAMMADEX SODIUM 200 MG/2ML IV SOLN
INTRAVENOUS | Status: DC | PRN
  Administered 2017-05-07: 50 mg via INTRAVENOUS
  Administered 2017-05-07: 100 mg via INTRAVENOUS

## 2017-05-07 MED ORDER — DEXAMETHASONE SODIUM PHOSPHATE 10 MG/ML IJ SOLN
INTRAMUSCULAR | Status: DC | PRN
  Administered 2017-05-07: 4 mg via INTRAVENOUS

## 2017-05-07 MED ORDER — HYDROMORPHONE HCL 1 MG/ML IJ SOLN: 0.2500 mg | INTRAMUSCULAR | Status: DC | PRN

## 2017-05-07 MED ORDER — FENTANYL CITRATE (PF) 100 MCG/2ML IJ SOLN
INTRAMUSCULAR | Status: DC | PRN
  Administered 2017-05-07: 100 ug via INTRAVENOUS
  Administered 2017-05-07 (×2): 50 ug via INTRAVENOUS

## 2017-05-07 MED ORDER — IOPAMIDOL (ISOVUE-300) INJECTION 61%
INTRAVENOUS | Status: AC
  Filled 2017-05-07: qty 100

## 2017-05-07 MED ORDER — HEPARIN SODIUM (PORCINE) 1000 UNIT/ML IJ SOLN
INTRAMUSCULAR | Status: AC
Start: 2017-05-07 — End: 2017-05-07
  Filled 2017-05-07: qty 1

## 2017-05-07 MED ORDER — ASPIRIN 325 MG PO TABS
325.0000 mg | ORAL_TABLET | Freq: Every day | ORAL | Status: DC
Start: 2017-05-08 — End: 2017-05-08
  Administered 2017-05-08: 325 mg via ORAL
  Filled 2017-05-07: qty 1

## 2017-05-07 MED ORDER — PHENYLEPHRINE 40 MCG/ML (10ML) SYRINGE FOR IV PUSH (FOR BLOOD PRESSURE SUPPORT)
PREFILLED_SYRINGE | INTRAVENOUS | Status: DC | PRN
  Administered 2017-05-07 (×4): 40 ug via INTRAVENOUS

## 2017-05-07 MED ORDER — CLEVIDIPINE BUTYRATE 0.5 MG/ML IV EMUL
0.0000 mg/h | INTRAVENOUS | Status: DC
  Administered 2017-05-07: 15 mg/h via INTRAVENOUS
  Administered 2017-05-07: 4 mg/h via INTRAVENOUS
  Administered 2017-05-07: 2 mg/h via INTRAVENOUS
  Administered 2017-05-07: 9 mg/h via INTRAVENOUS
  Administered 2017-05-07: 15 mg/h via INTRAVENOUS
  Administered 2017-05-07: 1 mg/h via INTRAVENOUS
  Administered 2017-05-08: 20 mg/h via INTRAVENOUS
  Administered 2017-05-08: 12 mg/h via INTRAVENOUS
  Filled 2017-05-07 (×6): qty 50

## 2017-05-07 MED ORDER — SODIUM CHLORIDE 0.9 % IV SOLN: Freq: Once | INTRAVENOUS | Status: AC

## 2017-05-07 MED ORDER — ACETAMINOPHEN 650 MG RE SUPP: 650.0000 mg | RECTAL | Status: DC | PRN

## 2017-05-07 MED ORDER — NICARDIPINE HCL IN NACL 20-0.86 MG/200ML-% IV SOLN
INTRAVENOUS | Status: DC | PRN
  Administered 2017-05-07: 5 mg/h via INTRAVENOUS

## 2017-05-07 MED ORDER — PHENYLEPHRINE HCL 10 MG/ML IJ SOLN
INTRAVENOUS | Status: DC | PRN
  Administered 2017-05-07: 20 ug/min via INTRAVENOUS

## 2017-05-07 MED ORDER — PROPOFOL 10 MG/ML IV BOLUS
INTRAVENOUS | Status: DC | PRN
  Administered 2017-05-07: 40 mg via INTRAVENOUS
  Administered 2017-05-07: 150 mg via INTRAVENOUS
  Administered 2017-05-07: 30 mg via INTRAVENOUS
  Administered 2017-05-07: 50 mg via INTRAVENOUS
  Administered 2017-05-07: 30 mg via INTRAVENOUS

## 2017-05-07 MED ORDER — HEPARIN SODIUM (PORCINE) 1000 UNIT/ML IJ SOLN
INTRAMUSCULAR | Status: DC | PRN
  Administered 2017-05-07 (×3): 500 [IU] via INTRAVENOUS
  Administered 2017-05-07: 1000 [IU] via INTRAVENOUS
  Administered 2017-05-07: 2000 [IU] via INTRAVENOUS

## 2017-05-07 MED ORDER — NIMODIPINE 30 MG PO CAPS: 0.0000 mg | ORAL_CAPSULE | ORAL | Status: DC

## 2017-05-07 MED ORDER — LABETALOL HCL 5 MG/ML IV SOLN
INTRAVENOUS | Status: DC | PRN
  Administered 2017-05-07: 5 mg via INTRAVENOUS
  Administered 2017-05-07 (×2): 10 mg via INTRAVENOUS
  Administered 2017-05-07 (×3): 5 mg via INTRAVENOUS

## 2017-05-07 MED ORDER — EPTIFIBATIDE 20 MG/10ML IV SOLN
INTRAVENOUS | Status: AC
  Filled 2017-05-07: qty 10

## 2017-05-07 MED ORDER — ASPIRIN EC 325 MG PO TBEC: 325.0000 mg | DELAYED_RELEASE_TABLET | ORAL | Status: DC

## 2017-05-07 MED ORDER — EPHEDRINE SULFATE-NACL 50-0.9 MG/10ML-% IV SOSY
PREFILLED_SYRINGE | INTRAVENOUS | Status: DC | PRN
  Administered 2017-05-07 (×2): 5 mg via INTRAVENOUS

## 2017-05-07 MED ORDER — EPTIFIBATIDE 20 MG/10ML IV SOLN
INTRAVENOUS | Status: AC | PRN
  Administered 2017-05-07 (×6): 1.8 mg via INTRAVENOUS

## 2017-05-07 MED ORDER — ROCURONIUM BROMIDE 100 MG/10ML IV SOLN
INTRAVENOUS | Status: DC | PRN
  Administered 2017-05-07: 50 mg via INTRAVENOUS
  Administered 2017-05-07: 10 mg via INTRAVENOUS

## 2017-05-07 MED ORDER — PROTAMINE SULFATE 10 MG/ML IV SOLN
INTRAVENOUS | Status: DC | PRN
  Administered 2017-05-07: 10 mg via INTRAVENOUS
  Administered 2017-05-07: 5 mg via INTRAVENOUS

## 2017-05-07 MED ORDER — ACETAMINOPHEN 160 MG/5ML PO SOLN: 650.0000 mg | ORAL | Status: DC | PRN

## 2017-05-07 MED ORDER — IOPAMIDOL (ISOVUE-300) INJECTION 61%
INTRAVENOUS | Status: AC
  Administered 2017-05-07: 115 mL
  Filled 2017-05-07: qty 100

## 2017-05-07 MED ORDER — CEFAZOLIN SODIUM-DEXTROSE 2-4 GM/100ML-% IV SOLN
2.0000 g | INTRAVENOUS | Status: AC
  Administered 2017-05-07: 2 g via INTRAVENOUS

## 2017-05-07 MED ORDER — CLOPIDOGREL BISULFATE 75 MG PO TABS: 75.0000 mg | ORAL_TABLET | ORAL | Status: DC

## 2017-05-07 MED ORDER — HEPARIN (PORCINE) IN NACL 100-0.45 UNIT/ML-% IJ SOLN
400.0000 [IU]/h | INTRAMUSCULAR | Status: DC
  Administered 2017-05-07: 400 [IU]/h via INTRAVENOUS
  Filled 2017-05-07: qty 250

## 2017-05-07 MED ORDER — SODIUM CHLORIDE 0.9 % IV SOLN
INTRAVENOUS | Status: DC
  Administered 2017-05-07 (×2): via INTRAVENOUS

## 2017-05-07 MED ORDER — SODIUM CHLORIDE 0.9 % IV SOLN
INTRAVENOUS | Status: DC
  Administered 2017-05-07 – 2017-05-08 (×2): via INTRAVENOUS

## 2017-05-07 NOTE — Anesthesia Procedure Notes (Addendum)
Procedure Name: Intubation Date/Time: 05/07/2017 9:12 AM Performed by: Orlie Dakin Pre-anesthesia Checklist: Patient identified, Suction available, Patient being monitored, Emergency Drugs available and Timeout performed Patient Re-evaluated:Patient Re-evaluated prior to inductionOxygen Delivery Method: Circle system utilized Preoxygenation: Pre-oxygenation with 100% oxygen Intubation Type: IV induction Ventilation: Mask ventilation without difficulty Laryngoscope Size: Miller and 3 Grade View: Grade II Tube type: Oral Tube size: 7.5 mm Number of attempts: 1 Airway Equipment and Method: Stylet Placement Confirmation: ETT inserted through vocal cords under direct vision,  positive ETCO2 and breath sounds checked- equal and bilateral Secured at: 20 cm Tube secured with: Tape Dental Injury: Teeth and Oropharynx as per pre-operative assessment  Comments: 4x4s bite block used.

## 2017-05-07 NOTE — Procedures (Signed)
S/P RT common carotid arteriogram. RT CFA approach. Findings. 1 Staged embolization of Rt MCA aneurysms using the LVIS junior stent device.

## 2017-05-07 NOTE — Progress Notes (Signed)
Patient ID: Amy Howell, female   DOB: 07-22-1956, 61 y.o.   MRN: [id number] INR  Post procedure  Extubated without difficulty. Denies any H/S ,N/V  Or visual problems. Responds appropriately Slight weakness in the left arm and leg unchanged from baseline. VSS .  RT Groin soft . Distal pulses 2+  DPs and PTs. Arlean Hopping MD

## 2017-05-07 NOTE — Sedation Documentation (Signed)
6 Fr. Exoseal to right groin. 

## 2017-05-07 NOTE — H&P (Signed)
Chief Complaint: Patient was seen in consultation today for aneurysm  Supervising Physician: Luanne Bras  Patient Status: [facility] - Out-pt  History of Present Illness: Amy Howell is a 61 y.o. 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   CTA 03/26/17 showed: 1. No emergent large vessel occlusion of the intracranial circulation. 2. 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 3. 3 x 4 mm inferiorly directed aneurysm arising from the bifurcation of the right middle cerebral artery. 4. 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 Both PCAs are of normal caliber. 5. No occlusion, dissection or hemodynamically significant stenosis of the cervical arteries.  Cerebral angiogram 04/30/17 showed: Severe high-grade stenosis of the distal basilar artery of 90-95%.  Severe high-grade stenosis of the mid basilar artery just proximal to the anterior-inferior cerebellar arteries of 70%. Approximately 50% stenosis of the origin of the left vertebral artery.  Right middle cerebral artery mid M1 segment aneurysm measuring approximately 5.3 mm x 4.2 mm, with a slightly more distal aneurysm of the M1 segment at the level of the anterior temporal branch measuring approximately 4.1 mm x 3.3 mm.  Patient presents today for intervention of her RMCA aneurysm.   She has been NPO. She does not take blood thinners.  She has been on Plavix and aspirin as prescribed.   Past Medical History:  Diagnosis Date  . Cataracts, bilateral   . Glaucoma   . Headache   . Hypertension   . Stroke [facility])    no residual, "series of mini strokes"  . Vertigo     Past Surgical History:  Procedure Laterality Date  .  ABDOMINAL HYSTERECTOMY    . gum operation    . IR ANGIO INTRA EXTRACRAN SEL COM CAROTID INNOMINATE BILAT MOD SED  04/23/2017  . IR ANGIO VERTEBRAL SEL VERTEBRAL BILAT MOD SED  04/23/2017  . IR RADIOLOGIST EVAL & MGMT  04/16/2017  . LOOP RECORDER INSERTION N/A 03/28/2017   Procedure: Loop Recorder Insertion;  Surgeon: Thompson Grayer, MD;  Location: [place] [facility];  Service: Cardiovascular;  Laterality: N/A;  . TEE WITHOUT CARDIOVERSION N/A 03/28/2017   Procedure: TRANSESOPHAGEAL ECHOCARDIOGRAM (TEE);  Surgeon: Lelon Perla, MD;  Location[facility] ENDOSCOPY;  Service: Cardiovascular;  Laterality: N/A;    Allergies: No known allergies  Medications: Prior to Admission medications   Medication Sig Start Date End Date Taking? Authorizing Provider  acetaminophen (TYLENOL) 500 MG tablet Take 1,000 mg by mouth every 6 (six) hours as needed (for pain.).    [provider]  amLODipine (NORVASC) 10 MG tablet Take 1 tablet (10 mg total) by mouth daily. 04/30/17   Alfonse Spruce, FNP  aspirin 325 MG tablet Take 1 tablet (325 mg total) by mouth daily. 03/29/17   Rosita Fire, MD  atorvastatin (LIPITOR) 40 MG tablet Take 1 tablet (40 mg total) by mouth daily at 6 PM. 04/30/17   Alfonse Spruce, FNP  clopidogrel (PLAVIX) 75 MG tablet Take 75 mg by mouth daily. 04/24/17   [provider]  feeding supplement, ENSURE ENLIVE, (ENSURE ENLIVE) LIQD Take 237 mLs by mouth 3 (three) times daily.     [provider]  glucose blood test strip Use as instructed Patient not taking: Reported  on 04/21/2017 04/10/17   Alfonse Spruce, FNP  latanoprost (XALATAN) 0.005 % ophthalmic solution Place 1 drop into both eyes at bedtime.  03/26/17   [provider]  losartan (COZAAR) 25 MG tablet Take 1 tablet (25 mg total) by mouth daily. Patient not taking: Reported on 04/16/2017 04/10/17   Alfonse Spruce, FNP  meclizine (ANTIVERT) 25 MG tablet Take 0.5-1 tablets (12.5-25 mg  total) by mouth 3 (three) times daily as needed for dizziness or nausea. Patient not taking: Reported on 04/16/2017 02/18/17   Margarita Mail, PA-C  meloxicam [facility]) 7.5 MG tablet 1 bid prn pain Patient not taking: Reported on 04/16/2017 03/18/17   Argentina Donovan, PA-C  naproxen sodium (ANAPROX) 220 MG tablet Take 440 mg by mouth 3 (three) times daily as needed (for pain.).    [provider]  oxyCODONE-acetaminophen (PERCOCET) 5-325 MG tablet Take 1 tablet by mouth every 6 (six) hours as needed for severe pain. Patient not taking: Reported on 04/16/2017 02/18/17   Margarita Mail, PA-C     Family History  Problem Relation Age of Onset  . Hypertension Father   . Diabetes Father     Social History   Social History  . Marital status: Single    Spouse name: N/A  . Number of children: N/A  . Years of education: N/A   Social History Main Topics  . Smoking status: Current Every Day Smoker    Types: Cigarettes  . Smokeless tobacco: Never Used     Comment: 1 cigarette a day   . Alcohol use No  . Drug use: No  . Sexual activity: Not on file   Other Topics Concern  . Not on file   Social History Narrative  . No narrative on file    Review of Systems  Constitutional: Negative for fatigue and fever.  Respiratory: Negative for cough and shortness of breath.   Cardiovascular: Negative for chest pain.  Gastrointestinal: Negative for abdominal pain.  Psychiatric/Behavioral: Negative for behavioral problems and confusion.    Vital Signs: There were no vitals taken for this visit.  Physical Exam  Constitutional: She is oriented to person, place, and time. She appears well-developed.  Cardiovascular: Normal rate, regular rhythm and normal heart sounds.   Pulmonary/Chest: Effort normal and breath sounds normal. No respiratory distress.  Abdominal: Soft.  Neurological: She is alert and oriented to person, place, and time.  Skin: Skin is warm and dry.  Psychiatric: She has  a normal mood and affect. Her behavior is normal. Judgment and thought content normal.  Nursing note and vitals reviewed.   Mallampati Score:     Imaging: Ir Radiologist Eval & Mgmt  Result Date: 04/18/2017 EXAM: NEW PATIENT OFFICE VISIT CHIEF COMPLAINT: New onset headaches. Recent ischemic strokes. Discovery of intracranial aneurysms. Current Pain Level: 1-10 HISTORY OF PRESENT ILLNESS: The patient is a 61 year old right-handed lady who has been referred for evaluation of recently discovered unruptured intracranial aneurysms following her recent admission for ischemic strokes. The patient is accompanied by her daughter. The patient was admitted on April 25th of this year with symptoms of blurred vision, left-sided weakness and a history of severe headaches. 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 The patient reports the start of new onset headaches in February of this year which were mostly of the left temporoparietal region usually severe without photophobia or nausea or vomiting. The headaches would be sharp or throbbing.  Occasionally these headaches would radiate to the left side of her head. No history of loss of consciousness or of seizures. No other neurological history of speech difficulties, motor weakness, sensory changes, or of incoordination. Since the time of her stroke, the patient has also complained of bilateral blurred vision with difficulty in visualizing objects in her left visual field. The patient reports having had no further headaches since her discharge from the hospital in late May of this year. The patient is awaiting physical therapy, occupational therapy and speech therapy which begins on May 30th on an outpatient basis. Past Medical History: Essential hypertension. Recently discovered bilateral occipital infarcts right greater than left. Underlying severe distal basilar artery stenosis of  approximately 75%. History of hyperlipidemia. Medications: Amlodipine. Aspirin 325 mg a day. Atorvastatin. Meclizine. Meloxicam. Allergies: No known allergies. Social History: Patient is retired, has 2 daughters alive and well. She has smoked 1-2 cigarettes a day and has done so since the age of 44. She denies drinking alcohol or using illicit chemicals. Family History: Father has diabetes mellitus. Mother died age 42 of Alzheimer's. No known family history of intracranial aneurysms. REVIEW OF SYSTEMS: Denies any recent chills, fever or rigors. Denies any chest pain, shortness of breath or paroxysmal nocturnal dyspnea. Denies any cough or wheezing.  Denies any hemoptysis. No abdominal pains, nausea, vomiting, constipation or diarrhea. No history of melena. PHYSICAL EXAMINATION: In no acute distress.  Affect appropriate for situation. Neurologically appears to have a partial left homonymous hemianopsia. No discernible other lateralizing cranial nerve, motor, sensory or coordination or station and gait abnormalities. ASSESSMENT AND PLAN: The patient's recent CT angiogram of the head and neck, and her MRI of the brain were reviewed with her and her daughter. Brought to their attention was the suspected high-grade approximately 70-75% stenosis of the distal basilar artery, and also the presence of two intracranial aneurysms in the right middle cerebral artery distribution one proximally. The proximal larger one measures approximately 7 mm x 4 mm while the distal one measures 4 mm x 3 mm. Approximately 70-75% stenosis of the distal basilar artery at the level of the posterior cerebral arteries. The patient was informed that it is possible that the more severe new onset right-sided headaches could be attributable to the two intracranial aneurysms. Also she was informed that the bilateral occipital ischemic infarcts subacute were probably related to the suspected 70-75% stenosis in the distal basilar artery just proximal  to the origin of the posterior cerebral arteries. The natural history of unruptured intracranial aneurysms was reviewed in detail. Risks of rupture of 1-2% per year per aneurysm with attendant significant mortality and morbidity were also reviewed. Increased risk of rupture related to female gender, smoking, diabetes, hyperlipidemia and family history of intracranial aneurysms. Options regarding management of unruptured intracranial aneurysms, continued medical surveillance versus consideration of elimination of the aneurysm from the parent circulation. This may entail endovascular treatment and/or neurosurgical clipping. Also discussed was the natural history of high-grade stenosis such as the distal basilar artery stenoses, with the increased risk of a subsequent stroke within the first 8-12 weeks following the initial stroke. It was advised that the patient undergo a formal diagnostic catheter arteriogram to accurately evaluate the above-mentioned intracranial aneurysms and also of the distal basilar artery stenosis. Depending on the angiographic findings, further management planning to be undertaken in regards to treatment of the intracranial aneurysms, and also of the the distal symptomatic basilar artery stenosis. The patient and the daughter are in agreement to  proceed with a diagnostic catheter arteriogram which will be scheduled as early as possible. In the meantime, the patient has been strongly advised to stop smoking and to maintain good hydration. She was also asked to take her high blood pressure pills on a regular basis as well as her statins. Questions were answered to their satisfaction. They were asked to call should they have any concerns or questions. They both leave with good understanding and agreement with the above management plan. Electronically Signed   By: Luanne Bras M.D.   On: 04/16/2017 16:18   Ir Angio Intra Extracran Sel Com Carotid Innominate Bilat Mod Sed  Result Date:  04/30/2017 CLINICAL DATA:  History of recent onset of severe right-sided headaches. Vertebrobasilar ischemic symptoms. Discovery of 2 intracranial aneurysms and basilar artery stenosis. EXAM: BILATERAL COMMON CAROTID AND INNOMINATE ANGIOGRAPHY; IR ANGIO VERTEBRAL SEL VERTEBRAL BILAT MOD SED COMPARISON:  MRI MRA of the brain of 03/27/2017. MEDICATIONS: Heparin 1000 units IV. No antibiotic was administered within 1 hour of the procedure. ANESTHESIA/SEDATION: Versed 1 mg IV; Fentanyl 25 mcg IV. Moderate Sedation Time:  30 minutes. The patient was continuously monitored during the procedure by the interventional radiology nurse under my direct supervision. CONTRAST:  Isovue 300 approximately 65 mL. FLUOROSCOPY TIME:  Fluoroscopy Time: 9 minutes 0 seconds (554 mGy). COMPLICATIONS: None immediate. TECHNIQUE: Informed written consent was obtained from the patient after a thorough discussion of the procedural risks, benefits and alternatives. All questions were addressed. Maximal Sterile Barrier Technique was utilized including caps, mask, sterile gowns, sterile gloves, sterile drape, hand hygiene and skin antiseptic. A timeout was performed prior to the initiation of the procedure. The right groin was prepped and draped in the usual sterile fashion. Thereafter using modified Seldinger technique, transfemoral access into the right common femoral artery was obtained without difficulty. Over a 0.035 inch guidewire, a 5 French Pinnacle sheath was inserted. Through this, and also over 0.035 inch guidewire, a 5 [place] JB 1 catheter was advanced to the aortic arch region and selectively positioned in the right common carotid artery,, the right vertebral artery, the left common carotid artery and the left vertebral artery. FINDINGS: The right common carotid arteriogram demonstrates the right external carotid artery and its major branches to be widely patent. The right internal carotid artery at the bulb has approximately 10-15%  stenosis by the NASCET criteria secondary to a posteriorly positioned smooth plaque. No acute ulcerations are seen. Distal to this the vessel is seen to opacify normally to the cranial skull base. The petrous and cavernous segments are widely patent. There is approximately 50% stenosis of the right internal carotid artery supraclinoid segment. The right middle cerebral artery demonstrates patency with a focal area of mild stenosis in the distal 1/3 of the M1 segment. The vessel is, otherwise, seen to opacify into the capillary and venous phases. Arising in the mid M1 segment of the right middle cerebral artery is a saccular aneurysm projecting posteriorly which measures 5.3 mm x 4.2 mm. There is a second aneurysm measuring approximately 4.1 mm x 3.3 mm arising in the region of the anterior temporal branch in the distal right M1 segment. Mild arteriosclerotic changes are seen involving the inferior division of the right middle cerebral artery proximally. The right anterior cerebral artery has a mild stenosis at its origin. The vessel is, otherwise, seen to opacify into the capillary and venous phases. Mild caliber irregularity involving the pericallosal branches suggests intracranial arteriosclerosis. The right vertebral artery origin is normal. The  vessel is seen to opacify normally to the cranial skull base. Normal opacification is seen of the right posterior-inferior cerebellar artery and the right vertebrobasilar junction. The proximal basilar artery has diffuse narrowing which is most severe just proximal to the origin of the anterior-inferior cerebellar arteries were it is approximately 70%. Distal to this there is mild-to-moderate atherosclerotic disease involving the mid basilar artery. There is a high-grade stenosis of the distal basilar artery of 90-95% just distal to the origin of the superior cerebellar arteries. The left common carotid arteriogram demonstrates the left external carotid artery and its  major branches to be widely patent. The left internal carotid artery at the bulb to the cranial skull base opacifies normally. The petrous, the cavernous and the supraclinoid segments are widely patent with mild arteriosclerotic changes involving the proximal cavernous segment. The left middle cerebral artery demonstrates a 50% stenosis in its mid M1 segment. More distally, the middle cerebral artery branches, and the left anterior cerebral artery are seen to opacify into the capillary and venous phases. Mild caliber irregularity is noted of the callosal marginal branch at the level of the mid corpus callosum. The left vertebral artery origin demonstrates approximately 50% stenosis. The vessel distal to this opacifies normally. Normal opacification is seen of the left posterior-inferior cerebellar artery and left vertebrobasilar junction. Again demonstrated is the severe high-grade stenosis of the mid basilar artery just proximal to the anterior-inferior cerebellar arteries of 70%. There is a severe high-grade stenosis of 90-95% of the distal basilar artery as described earlier. 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 Severe high-grade stenosis of the mid basilar artery just proximal to the anterior-inferior cerebellar arteries of 70%. Approximately 50% stenosis of the origin of the left vertebral artery. Right middle cerebral artery mid M1 segment aneurysm measuring approximately 5.3 mm x 4.2 mm, with a slightly more distal aneurysm of the M1 segment at the level of the anterior temporal branch measuring approximately 4.1 mm x 3.3 mm. PLAN: Findings reviewed with the patient and the patient's daughter. As per previous discussions, they would like to proceed with endovascular treatment of the intracranial aneurysms given the progressive onset of significant headaches on the right side over the past few months. The patient and the daughter were asked to call should they have any  concerns or questions. Should she have worsening symptoms of vertebrobasilar ischemia such as dysarthria, gait imbalance, visual problems or experience a sudden severe headache to call 911. Electronically Signed   By: Luanne Bras M.D.   On: 04/28/2017 10:11   Ir Angio Vertebral Sel Vertebral Bilat Mod Sed  Result Date: 04/30/2017 CLINICAL DATA:  History of recent onset of severe right-sided headaches. Vertebrobasilar ischemic symptoms. Discovery of 2 intracranial aneurysms and basilar artery stenosis. EXAM: BILATERAL COMMON CAROTID AND INNOMINATE ANGIOGRAPHY; IR ANGIO VERTEBRAL SEL VERTEBRAL BILAT MOD SED COMPARISON:  MRI MRA of the brain of 03/27/2017. MEDICATIONS: Heparin 1000 units IV. No antibiotic was administered within 1 hour of the procedure. ANESTHESIA/SEDATION: Versed 1 mg IV; Fentanyl 25 mcg IV. Moderate Sedation Time:  30 minutes. The patient was continuously monitored during the procedure by the interventional radiology nurse under my direct supervision. CONTRAST:  Isovue 300 approximately 65 mL. FLUOROSCOPY TIME:  Fluoroscopy Time: 9 minutes 0 seconds (554 mGy). COMPLICATIONS: None immediate. TECHNIQUE: Informed written consent was obtained from the patient after a thorough discussion of the procedural risks, benefits and alternatives. All questions were addressed. Maximal Sterile Barrier Technique was utilized including  caps, mask, sterile gowns, sterile gloves, sterile drape, hand hygiene and skin antiseptic. A timeout was performed prior to the initiation of the procedure. The right groin was prepped and draped in the usual sterile fashion. Thereafter using modified Seldinger technique, transfemoral access into the right common femoral artery was obtained without difficulty. Over a 0.035 inch guidewire, a 5 French Pinnacle sheath was inserted. Through this, and also over 0.035 inch guidewire, a 5 [place] JB 1 catheter was advanced to the aortic arch region and selectively positioned in the  right common carotid artery,, the right vertebral artery, the left common carotid artery and the left vertebral artery. FINDINGS: The right common carotid arteriogram demonstrates the right external carotid artery and its major branches to be widely patent. The right internal carotid artery at the bulb has approximately 10-15% stenosis by the NASCET criteria secondary to a posteriorly positioned smooth plaque. No acute ulcerations are seen. Distal to this the vessel is seen to opacify normally to the cranial skull base. The petrous and cavernous segments are widely patent. There is approximately 50% stenosis of the right internal carotid artery supraclinoid segment. The right middle cerebral artery demonstrates patency with a focal area of mild stenosis in the distal 1/3 of the M1 segment. The vessel is, otherwise, seen to opacify into the capillary and venous phases. Arising in the mid M1 segment of the right middle cerebral artery is a saccular aneurysm projecting posteriorly which measures 5.3 mm x 4.2 mm. There is a second aneurysm measuring approximately 4.1 mm x 3.3 mm arising in the region of the anterior temporal branch in the distal right M1 segment. Mild arteriosclerotic changes are seen involving the inferior division of the right middle cerebral artery proximally. The right anterior cerebral artery has a mild stenosis at its origin. The vessel is, otherwise, seen to opacify into the capillary and venous phases. Mild caliber irregularity involving the pericallosal branches suggests intracranial arteriosclerosis. The right vertebral artery origin is normal. The vessel is seen to opacify normally to the cranial skull base. Normal opacification is seen of the right posterior-inferior cerebellar artery and the right vertebrobasilar junction. The proximal basilar artery has diffuse narrowing which is most severe just proximal to the origin of the anterior-inferior cerebellar arteries were it is approximately  70%. Distal to this there is mild-to-moderate atherosclerotic disease involving the mid basilar artery. There is a high-grade stenosis of the distal basilar artery of 90-95% just distal to the origin of the superior cerebellar arteries. The left common carotid arteriogram demonstrates the left external carotid artery and its major branches to be widely patent. The left internal carotid artery at the bulb to the cranial skull base opacifies normally. The petrous, the cavernous and the supraclinoid segments are widely patent with mild arteriosclerotic changes involving the proximal cavernous segment. The left middle cerebral artery demonstrates a 50% stenosis in its mid M1 segment. More distally, the middle cerebral artery branches, and the left anterior cerebral artery are seen to opacify into the capillary and venous phases. Mild caliber irregularity is noted of the callosal marginal branch at the level of the mid corpus callosum. The left vertebral artery origin demonstrates approximately 50% stenosis. The vessel distal to this opacifies normally. Normal opacification is seen of the left posterior-inferior cerebellar artery and left vertebrobasilar junction. Again demonstrated is the severe high-grade stenosis of the mid basilar artery just proximal to the anterior-inferior cerebellar arteries of 70%. There is a severe high-grade stenosis of 90-95% of the distal basilar  artery as described earlier. 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 Severe high-grade stenosis of the mid basilar artery just proximal to the anterior-inferior cerebellar arteries of 70%. Approximately 50% stenosis of the origin of the left vertebral artery. Right middle cerebral artery mid M1 segment aneurysm measuring approximately 5.3 mm x 4.2 mm, with a slightly more distal aneurysm of the M1 segment at the level of the anterior temporal branch measuring approximately 4.1 mm x 3.3 mm. PLAN: Findings  reviewed with the patient and the patient's daughter. As per previous discussions, they would like to proceed with endovascular treatment of the intracranial aneurysms given the progressive onset of significant headaches on the right side over the past few months. The patient and the daughter were asked to call should they have any concerns or questions. Should she have worsening symptoms of vertebrobasilar ischemia such as dysarthria, gait imbalance, visual problems or experience a sudden severe headache to call 911. Electronically Signed   By: Luanne Bras M.D.   On: 04/28/2017 10:11    Labs:  CBC:  Recent Labs  03/18/17 1044 03/26/17 1856 04/23/17 0707 05/05/17 1324  WBC 6.2 6.6 8.8 7.2  HGB  --  12.6 10.0* 10.7*  HCT 39.2 39.0 31.6* 34.0*  PLT 176 161 254 281    COAGS:  Recent Labs  03/26/17 1856 04/23/17 0707 05/05/17 1324  INR 1.05 1.06 1.06  APTT 29 36 29    BMP:  Recent Labs  03/18/17 1044 03/26/17 1856 04/23/17 0707 05/05/17 1324  NA 141 138 137 138  K 4.4 3.6 3.8 3.9  CL 99 101 100* 104  CO2 26 29 26 24   GLUCOSE 87 176* 94 83  BUN 16 15 11 14   CALCIUM 10.0 9.9 9.4 9.7  CREATININE 0.87 0.91 0.77 0.77  GFRNONAA 71 >60 >60 >60  GFRAA 82 >60 >60 >60    LIVER FUNCTION TESTS:  Recent Labs  03/18/17 1044 03/26/17 1856 05/05/17 1324  BILITOT 0.3 0.4 0.6  AST 15 20 78*  ALT 12 10* 89*  ALKPHOS 96 81 400*  PROT 8.4 7.8 7.9  ALBUMIN 4.8 4.1 3.7    TUMOR MARKERS: No results for input(s): AFPTM, CEA, CA199, CHROMGRNA in the last 8760 hours.  Assessment and Plan: Patient with history of recent ischemic strokes is found to have intracranial aneurysms and basilar artery stenosis.  She presents today for intervention of the RMCA aneurysms.  Patient was started on Plavix and aspirin in May.  Her P2Y12 upon pre-procedure screening 05/05/17 was 217.  Her P2Y12 today is 184.  She did take Plavix last night and this AM Discussed with Dr. Estanislado Pandy who  is agreeable to proceed.  She has been NPO. Risks and Benefits discussed with the patient including, but not limited to bleeding, infection, stroke, vascular injury or contrast induced renal failure. All of the patient's questions were answered, patient is agreeable to proceed. Consent signed and in chart.  Thank you for this interesting consult.  I greatly enjoyed meeting Kritika Stukes and look forward to participating in their care.  A copy of this report was sent to the requesting provider on this date.  Electronically Signed: Docia Barrier, PA 05/07/2017, 7:50 AM   I spent a total of    15 Minutes in face to face in clinical consultation, greater than 50% of which was counseling/coordinating care for RMCA aneurysm.

## 2017-05-07 NOTE — H&P (Deleted)
  The note originally documented on this encounter has been moved the the encounter in which it belongs.  

## 2017-05-07 NOTE — Transfer of Care (Signed)
Immediate Anesthesia Transfer of Care Note  Patient: Amy Howell  Procedure(s) Performed: Procedure(s): EMBOLIZATION (N/A)  Patient Location: PACU  Anesthesia Type:General  Level of Consciousness: awake, alert  and oriented  Airway & Oxygen Therapy: Patient Spontanous Breathing and Patient connected to nasal cannula oxygen  Post-op Assessment: Report given to RN, Post -op Vital signs reviewed and stable and Patient moving all extremities X 4  Post vital signs: Reviewed and stable  Last Vitals:  Vitals:   05/07/17 0727 05/07/17 1345  BP: (!) 151/65   Pulse: 65   Resp: 18   Temp: 36.9 C 36.6 C    Last Pain:  Vitals:   05/07/17 0727  TempSrc: Oral         Complications: No apparent anesthesia complications

## 2017-05-07 NOTE — Anesthesia Procedure Notes (Deleted)
Procedures

## 2017-05-07 NOTE — Progress Notes (Signed)
ANTICOAGULATION CONSULT NOTE - Initial Consult  Pharmacy Consult for heparin Indication: s/p cerebral angiogram  Allergies  Allergen Reactions  . No Known Allergies     Patient Measurements: Height: 5' 0.5" (153.7 cm) Weight: 94 lb (42.6 kg) IBW/kg (Calculated) : 46.65 Heparin Dosing Weight: 42.6kg  Vital Signs: Temp: 97.8 F (36.6 C) (06/06 1345) Temp Source: Oral (06/06 0727) BP: 151/65 (06/06 0727) Pulse Rate: 65 (06/06 0727)  Labs:  Recent Labs  05/05/17 1324  HGB 10.7*  HCT 34.0*  PLT 281  APTT 29  LABPROT 13.8  INR 1.06  CREATININE 0.77    Estimated Creatinine Clearance: 48.4 mL/min (by C-G formula based on SCr of 0.77 mg/dL).   Medical History: Past Medical History:  Diagnosis Date  . Cataracts, bilateral   . Glaucoma   . Headache   . Hypertension   . Stroke [facility])    no residual, "series of mini strokes"  . Vertigo     Medications:  Infusions:  . sodium chloride    . sodium chloride    . sodium chloride    . heparin      Assessment: 67 yof s/p cerebral angiogram to start IV heparin. Baseline H/H is slightly low but platelets are WNL. She is not on anticoagulation PTA.   Goal of Therapy:  Heparin level 0.1-0.25 units/ml Monitor platelets by anticoagulation protocol: Yes   Plan:  Reduce initial heparin starting rate to 400 units/hr due to patients small size Check an 8 hr heparin level *Heparin off at 0700 tomorrow  Sharonna Vinje, Rande Lawman 05/07/2017,1:53 PM

## 2017-05-08 ENCOUNTER — Encounter (HOSPITAL_COMMUNITY): Payer: Self-pay | Admitting: Interventional Radiology

## 2017-05-08 LAB — CBC WITH DIFFERENTIAL/PLATELET
BASOS ABS: 0 10*3/uL (ref 0.0–0.1)
BASOS PCT: 0 %
EOS PCT: 0 %
Eosinophils Absolute: 0 10*3/uL (ref 0.0–0.7)
HEMATOCRIT: 30.2 % — AB (ref 36.0–46.0)
Hemoglobin: 9.6 g/dL — ABNORMAL LOW (ref 12.0–15.0)
Lymphocytes Relative: 17 %
Lymphs Abs: 1.2 10*3/uL (ref 0.7–4.0)
MCH: 29.9 pg (ref 26.0–34.0)
MCHC: 31.8 g/dL (ref 30.0–36.0)
MCV: 94.1 fL (ref 78.0–100.0)
MONOS PCT: 5 %
Monocytes Absolute: 0.3 10*3/uL (ref 0.1–1.0)
NEUTROS ABS: 5.4 10*3/uL (ref 1.7–7.7)
Neutrophils Relative %: 78 %
PLATELETS: 262 10*3/uL (ref 150–400)
RBC: 3.21 MIL/uL — ABNORMAL LOW (ref 3.87–5.11)
RDW: 13.5 % (ref 11.5–15.5)
WBC: 6.9 10*3/uL (ref 4.0–10.5)

## 2017-05-08 LAB — BASIC METABOLIC PANEL
ANION GAP: 12 (ref 5–15)
BUN: 6 mg/dL (ref 6–20)
CALCIUM: 8.9 mg/dL (ref 8.9–10.3)
CO2: 22 mmol/L (ref 22–32)
CREATININE: 0.54 mg/dL (ref 0.44–1.00)
Chloride: 104 mmol/L (ref 101–111)
GFR calc Af Amer: >60 mL/min (ref >60)
Glucose, Bld: 100 mg/dL — ABNORMAL HIGH (ref 65–99)
Potassium: 3.7 mmol/L (ref 3.5–5.1)
SODIUM: 138 mmol/L (ref 135–145)

## 2017-05-08 LAB — HEPARIN LEVEL (UNFRACTIONATED): Heparin Unfractionated: 0.49 IU/mL (ref 0.30–0.70)

## 2017-05-08 LAB — PLATELET INHIBITION P2Y12: Platelet Function  P2Y12: 196 [PRU] (ref 194–418)

## 2017-05-08 LAB — TRIGLYCERIDES: TRIGLYCERIDES: 95 mg/dL (ref <150)

## 2017-05-08 MED ORDER — HEPARIN (PORCINE) IN NACL 100-0.45 UNIT/ML-% IJ SOLN
200.0000 [IU]/h | INTRAMUSCULAR | Status: AC
  Filled 2017-05-08: qty 250

## 2017-05-08 MED ORDER — AMLODIPINE BESYLATE 10 MG PO TABS
10.0000 mg | ORAL_TABLET | Freq: Every day | ORAL | Status: DC
  Administered 2017-05-08: 10 mg via ORAL
  Filled 2017-05-08: qty 1

## 2017-05-08 NOTE — Progress Notes (Signed)
Previous order states to go by a-line pressure. MD made aware that a-line pressure sustaining systolic [id number] and cleviprex gtt maxed out. New order to go by BP cuff instead of a-line. Will continue to monitor

## 2017-05-08 NOTE — Care Management Note (Signed)
Case Management Note  Patient Details  Name: Amy Howell MRN: [id number] Date of Birth: October 30, 1956  Subjective/Objective:                 Spoke with patient at the bedside. She states that she lives at home with her daughter. She states her daughter takes her to appointments. She follows w [facility] where she gets her Rx as well. Patient denies difficulties getting medications or care as outpatient. Patient states she ambulates w/o assistance. No CM needs identified at this time.    Action/Plan:  DC to home with daughter later today.  Expected Discharge Date:  05/08/17               Expected Discharge Plan:  Home/Self Care  In-House Referral:     Discharge planning Services  CM Consult  Post Acute Care Choice:    Choice offered to:     DME Arranged:    DME Agency:     HH Arranged:    HH Agency:     Status of Service:  Completed, signed off  If discussed at [employer] of Stay Meetings, dates discussed:    Additional Comments:  Carles Collet, RN 05/08/2017, 9:33 AM

## 2017-05-08 NOTE — Progress Notes (Signed)
Stopped by to visit with pt when 2 family members were in rm. Pt will be discharged today. Provided emotional/spiritual support and prayer, especially for pt's recovery so far and continued recovery once home, which family much appreciated. Chaplain available for f/u.   05/08/17 1000  Clinical Encounter Type  Visited With Patient and family together  Visit Type Initial;Psychological support;Spiritual support;Social support;Critical Care  Referral From Chaplain  Spiritual Encounters  Spiritual Needs Prayer;Emotional  Stress Factors  Patient Stress Factors Health changes;Loss of control  Family Stress Factors Family relationships;Health changes   Gerrit Heck, Chaplain

## 2017-05-08 NOTE — Progress Notes (Signed)
ANTICOAGULATION CONSULT NOTE - Follow Up Consult  Pharmacy Consult for heparin Indication: s/p cerebral angiogram  Labs:  Recent Labs  05/05/17 1324 05/07/17 1620 05/07/17 2345  HGB 10.7*  --   --   HCT 34.0*  --   --   PLT 281  --   --   APTT 29  --   --   LABPROT 13.8  --   --   INR 1.06  --   --   HEPARINUNFRC  --  1.48* 0.49  CREATININE 0.77  --   --      Assessment: 61yo female above goal on heparin with initial conservative dosing s/p cerebral angiogram; RN reports drawing lab from A-line.  Goal of Therapy:  Heparin level 0.1-0.25 units/ml   Plan:  Will decrease heparin gtt by half to 200 units/hr until off at 0700.  Wynona Neat, PharmD, BCPS  05/08/2017,12:40 AM

## 2017-05-08 NOTE — Discharge Summary (Signed)
    Patient ID: Amy Howell MRN: [id number] DOB/AGE: [birth year] 61 y.o.  Admit date: 05/07/2017 Discharge date: 05/08/2017  Supervising Physician: Luanne Bras  Admission Diagnoses: brain aneurysm   Discharge Diagnoses:  Active Problems:   Brain aneurysm   Discharged Condition: good  Hospital Course: the patient was admitted and underwent 1 Staged embolization of Rt MCA aneurysms using the LVIS junior stent device.  The patient tolerated this procedure well.  She was placed on heparin post procedure and her plavix and ASA were resumed the morning after the procedure.  She has no complaints today.  Her A-line and foley are removed and she has been ambulated with no issues.  She is tolerating a solid diet.  She is felt stable for discharge.  Consults: None  Discharge Exam: Blood pressure (!) 141/66, pulse 79, temperature 98.2 F (36.8 C), temperature source Axillary, resp. rate 12, height 5' (1.524 m), weight 96 lb 1.9 oz (43.6 kg), SpO2 100 %. General appearance: alert, cooperative and no distress Resp: clear to auscultation bilaterally Cardio: regular rate and rhythm Neurologic: slightly weaker in LUE which is her baseline.  Otherwise an unremarkable neuro exam Incision/Wound: R CFA site is c/d/i with bandage in place  Disposition: 01-Home or Self Care   Allergies as of 05/08/2017      Reactions   No Known Allergies       Medication List    TAKE these medications   acetaminophen 500 MG tablet Commonly known as:  TYLENOL Take 1,000 mg by mouth every 6 (six) hours as needed (for pain.).   amLODipine 10 MG tablet Commonly known as:  NORVASC Take 1 tablet (10 mg total) by mouth daily.   aspirin 325 MG tablet Take 1 tablet (325 mg total) by mouth daily.   atorvastatin 40 MG tablet Commonly known as:  LIPITOR Take 1 tablet (40 mg total) by mouth daily at 6 PM.   clopidogrel 75 MG tablet Commonly known as:  PLAVIX Take 75 mg by mouth daily.   feeding supplement  (ENSURE ENLIVE) Liqd Take 237 mLs by mouth 3 (three) times daily.   latanoprost 0.005 % ophthalmic solution Commonly known as:  XALATAN Place 1 drop into both eyes at bedtime.   naproxen sodium 220 MG tablet Commonly known as:  ANAPROX Take 440 mg by mouth 3 (three) times daily as needed (for pain.).      Follow-up Information    Luanne Bras, MD Follow up in 2 week(s).   Specialty:  Interventional Radiology Why:  [facility] will call you with an appointment date and time Contact information: [address] [place] [address] [phone]            Electronically Signed: Saverio Danker E 05/08/2017, 10:09 AM   I have spent Less Than 30 Minutes discharging Amy Howell.

## 2017-05-08 NOTE — Discharge Instructions (Signed)
Angiogram, Care After This sheet gives you information about how to care for yourself after your procedure. Your doctor may also give you more specific instructions. If you have problems or questions, contact your doctor. Follow these instructions at home: Insertion site care  Follow instructions from your doctor about how to take care of your long, thin tube (catheter) insertion area. Make sure you: ? Wash your hands with soap and water before you change your bandage (dressing). If you cannot use soap and water, use hand sanitizer. ? Change your bandage as told by your doctor. You may take this off tomorrow and cover with a band-aid for a couple of days  Do not take baths, swim, or use a hot tub until your doctor says it is okay.  You may shower 24-48 hours after the procedure or as told by your doctor. ? Gently wash the area with plain soap and water. ? Pat the area dry with a clean towel. ? Do not rub the area. This may cause bleeding.  Do not apply powder or lotion to the area. Keep the area clean and dry.  Check your insertion area every day for signs of infection. Check for: ? More redness, swelling, or pain. ? Fluid or blood. ? Warmth. ? Pus or a bad smell. Activity  Rest as told by your doctor, usually for 1-2 days.  Do not lift anything that is heavier than 10 lbs. (4.5 kg) or as told by your doctor.  Do not drive for 24 hours if you were given a medicine to help you relax (sedative).  Do not drive or use heavy machinery while taking prescription pain medicine. General instructions  Go back to your normal activities as told by your doctor, usually in about a week. Ask your doctor what activities are safe for you.  If the insertion area starts to bleed, lie flat and put pressure on the area. If the bleeding does not stop, get help right away. This is an emergency.  Drink enough fluid to keep your pee (urine) clear or pale yellow.  Take over-the-counter and prescription  medicines only as told by your doctor.  Keep all follow-up visits as told by your doctor. This is important. Contact a doctor if:  You have a fever.  You have chills.  You have more redness, swelling, or pain around your insertion area.  You have fluid or blood coming from your insertion area.  The insertion area feels warm to the touch.  You have pus or a bad smell coming from your insertion area.  You have more bruising around the insertion area.  Blood collects in the tissue around the insertion area (hematoma) that may be painful to the touch. Get help right away if:  You have a lot of pain in the insertion area.  The insertion area swells very fast.  The insertion area is bleeding, and the bleeding does not stop after holding steady pressure on the area.  The area near or just beyond the insertion area becomes pale, cool, tingly, or numb. These symptoms may be an emergency. Do not wait to see if the symptoms will go away. Get medical help right away. Call your local emergency services (911 in the [place] Do not drive yourself to the hospital. Summary  After the procedure, it is common to have bruising and tenderness at the long, thin tube insertion area.  After the procedure, it is important to rest and drink plenty of fluids.  Do not  take baths, swim, or use a hot tub until your doctor says it is okay to do so. You may shower 24-48 hours after the procedure or as told by your doctor.  If the insertion area starts to bleed, lie flat and put pressure on the area. If the bleeding does not stop, get help right away. This is an emergency. This information is not intended to replace advice given to you by your health care provider. Make sure you discuss any questions you have with your health care provider. Document Released: 02/14/2009 Document Revised: 11/12/2016 Document Reviewed: 11/12/2016 Elsevier Interactive Patient Education  2017 [employer].

## 2017-05-09 LAB — TYPE AND SCREEN
ABO/RH(D): O POS
Antibody Screen: NEGATIVE
Unit division: 0
Unit division: 0

## 2017-05-09 LAB — BPAM RBC
BLOOD PRODUCT EXPIRATION DATE: 201806272359
BLOOD PRODUCT EXPIRATION DATE: 201806272359
UNIT TYPE AND RH: 5100
Unit Type and Rh: 5100

## 2017-05-09 NOTE — Anesthesia Postprocedure Evaluation (Signed)
Anesthesia Post Note  Patient: Amy Howell  Procedure(s) Performed: Procedure(s) (LRB): EMBOLIZATION (N/A)     Patient location during evaluation: PACU Anesthesia Type: General Level of consciousness: awake Pain management: pain level controlled Vital Signs Assessment: post-procedure vital signs reviewed and stable Respiratory status: spontaneous breathing, nonlabored ventilation, respiratory function stable and patient connected to nasal cannula oxygen Cardiovascular status: stable Postop Assessment: no signs of nausea or vomiting Anesthetic complications: no    Last Vitals:  Vitals:   05/08/17 1149 05/08/17 1200  BP:  132/74  Pulse:  86  Resp:  (!) 21  Temp: 36.7 C     Last Pain:  Vitals:   05/08/17 1149  TempSrc: Oral  PainSc:                  [name]

## 2017-05-11 ENCOUNTER — Other Ambulatory Visit (HOSPITAL_COMMUNITY): Payer: Self-pay | Admitting: Interventional Radiology

## 2017-05-11 DIAGNOSIS — I671 Cerebral aneurysm, nonruptured: Secondary | ICD-10-CM

## 2017-05-12 ENCOUNTER — Telehealth: Payer: Self-pay

## 2017-05-12 ENCOUNTER — Ambulatory Visit: Payer: Self-pay | Admitting: Neurology

## 2017-05-12 NOTE — Telephone Encounter (Signed)
Pt no-showed her new pt appt today.

## 2017-05-13 ENCOUNTER — Encounter: Payer: Self-pay | Admitting: Neurology

## 2017-05-13 ENCOUNTER — Encounter (HOSPITAL_COMMUNITY): Payer: Self-pay | Admitting: Interventional Radiology

## 2017-05-14 ENCOUNTER — Telehealth (HOSPITAL_COMMUNITY): Payer: Self-pay

## 2017-05-14 NOTE — Telephone Encounter (Signed)
Called to schedule 2 wk f/u, left message for pt to return call. AW 

## 2017-05-16 ENCOUNTER — Other Ambulatory Visit (HOSPITAL_COMMUNITY): Payer: Self-pay | Admitting: Interventional Radiology

## 2017-05-16 ENCOUNTER — Encounter (HOSPITAL_COMMUNITY): Payer: Self-pay | Admitting: Interventional Radiology

## 2017-05-16 DIAGNOSIS — I729 Aneurysm of unspecified site: Secondary | ICD-10-CM

## 2017-05-22 ENCOUNTER — Encounter (HOSPITAL_COMMUNITY): Payer: Self-pay | Admitting: Anesthesiology

## 2017-05-26 MED FILL — ATORVASTATIN 40 MG TABLET: 40 | 30 days supply | Qty: 30 | Fill #1

## 2017-05-26 MED FILL — CLOPIDOGREL 75 MG TABLET: 75 | 30 days supply | Qty: 30 | Fill #1

## 2017-05-27 ENCOUNTER — Ambulatory Visit (INDEPENDENT_AMBULATORY_CARE_PROVIDER_SITE_OTHER): Payer: Self-pay | Admitting: *Deleted

## 2017-05-27 DIAGNOSIS — I639 Cerebral infarction, unspecified: Secondary | ICD-10-CM

## 2017-05-28 NOTE — Progress Notes (Signed)
Carelink Summary Report / Loop Recorder 

## 2017-05-29 ENCOUNTER — Other Ambulatory Visit (HOSPITAL_COMMUNITY): Payer: Self-pay | Admitting: Radiology

## 2017-05-29 ENCOUNTER — Ambulatory Visit (HOSPITAL_COMMUNITY)
Admission: RE | Admit: 2017-05-29 | Discharge: 2017-05-29 | Disposition: A | Discharge status: Home / Self Care | Payer: Self-pay | Source: Ambulatory Visit | Attending: Interventional Radiology | Admitting: Interventional Radiology

## 2017-05-29 DIAGNOSIS — Z7982 Long term (current) use of aspirin: Secondary | ICD-10-CM | POA: Insufficient documentation

## 2017-05-29 DIAGNOSIS — R42 Dizziness and giddiness: Secondary | ICD-10-CM | POA: Insufficient documentation

## 2017-05-29 DIAGNOSIS — I671 Cerebral aneurysm, nonruptured: Secondary | ICD-10-CM

## 2017-05-29 DIAGNOSIS — Z7902 Long term (current) use of antithrombotics/antiplatelets: Secondary | ICD-10-CM | POA: Insufficient documentation

## 2017-05-29 DIAGNOSIS — Z8774 Personal history of (corrected) congenital malformations of heart and circulatory system: Secondary | ICD-10-CM | POA: Insufficient documentation

## 2017-05-29 DIAGNOSIS — H538 Other visual disturbances: Secondary | ICD-10-CM | POA: Insufficient documentation

## 2017-05-29 DIAGNOSIS — F17210 Nicotine dependence, cigarettes, uncomplicated: Secondary | ICD-10-CM | POA: Insufficient documentation

## 2017-05-29 DIAGNOSIS — I729 Aneurysm of unspecified site: Secondary | ICD-10-CM

## 2017-05-29 HISTORY — PX: IR RADIOLOGIST EVAL & MGMT: IMG5224

## 2017-05-29 LAB — PLATELET INHIBITION P2Y12: Platelet Function  P2Y12: 229 [PRU] (ref 194–418)

## 2017-06-02 ENCOUNTER — Encounter (HOSPITAL_COMMUNITY): Admission: RE | Payer: Self-pay | Source: Ambulatory Visit

## 2017-06-02 ENCOUNTER — Encounter (HOSPITAL_COMMUNITY): Payer: Self-pay | Admitting: Interventional Radiology

## 2017-06-02 ENCOUNTER — Ambulatory Visit (HOSPITAL_COMMUNITY): Admission: RE | Admit: 2017-06-02 | Payer: Self-pay | Source: Ambulatory Visit | Admitting: Interventional Radiology

## 2017-06-02 ENCOUNTER — Ambulatory Visit (HOSPITAL_COMMUNITY): Admission: RE | Admit: 2017-06-02 | Payer: Self-pay | Source: Ambulatory Visit

## 2017-06-02 SURGERY — RADIOLOGY WITH ANESTHESIA
Anesthesia: General

## 2017-06-06 LAB — CUP PACEART REMOTE DEVICE CHECK
Date Time Interrogation Session: 20180626200925
MDC IDC PG IMPLANT DT: 20180427

## 2017-06-06 NOTE — Progress Notes (Signed)
Carelink summary report received. Battery status OK. Normal device function. No new symptom episodes, tachy episodes, brady, or pause episodes. No new AF episodes. Monthly summary reports and ROV/PRN 

## 2017-06-23 MED FILL — CLOPIDOGREL 75 MG TABLET: 75 | 30 days supply | Qty: 30 | Fill #2

## 2017-06-26 ENCOUNTER — Ambulatory Visit (INDEPENDENT_AMBULATORY_CARE_PROVIDER_SITE_OTHER): Payer: Self-pay | Admitting: *Deleted

## 2017-06-26 DIAGNOSIS — I639 Cerebral infarction, unspecified: Secondary | ICD-10-CM

## 2017-06-27 NOTE — Progress Notes (Signed)
Carelink Summary Report / Loop Recorder 

## 2017-06-30 ENCOUNTER — Encounter: Payer: Self-pay | Admitting: Family Medicine

## 2017-06-30 ENCOUNTER — Ambulatory Visit: Payer: Self-pay | Attending: Family Medicine | Admitting: Family Medicine

## 2017-06-30 VITALS — BP 117/69 | HR 79 | Temp 98.1°F | Resp 18 | Ht 60.0 in | Wt 99.6 lb

## 2017-06-30 DIAGNOSIS — I10 Essential (primary) hypertension: Secondary | ICD-10-CM

## 2017-06-30 DIAGNOSIS — Z79899 Other long term (current) drug therapy: Secondary | ICD-10-CM | POA: Insufficient documentation

## 2017-06-30 DIAGNOSIS — E119 Type 2 diabetes mellitus without complications: Secondary | ICD-10-CM | POA: Insufficient documentation

## 2017-06-30 DIAGNOSIS — Z7722 Contact with and (suspected) exposure to environmental tobacco smoke (acute) (chronic): Secondary | ICD-10-CM | POA: Insufficient documentation

## 2017-06-30 DIAGNOSIS — I6322 Cerebral infarction due to unspecified occlusion or stenosis of basilar arteries: Secondary | ICD-10-CM

## 2017-06-30 DIAGNOSIS — Z8673 Personal history of transient ischemic attack (TIA), and cerebral infarction without residual deficits: Secondary | ICD-10-CM | POA: Insufficient documentation

## 2017-06-30 DIAGNOSIS — Z7982 Long term (current) use of aspirin: Secondary | ICD-10-CM | POA: Insufficient documentation

## 2017-06-30 DIAGNOSIS — Z7902 Long term (current) use of antithrombotics/antiplatelets: Secondary | ICD-10-CM | POA: Insufficient documentation

## 2017-06-30 DIAGNOSIS — E782 Mixed hyperlipidemia: Secondary | ICD-10-CM

## 2017-06-30 MED ORDER — AMLODIPINE BESYLATE 10 MG PO TABS: 10.0000 mg | ORAL_TABLET | Freq: Every day | ORAL | 3 refills | Status: DC

## 2017-06-30 MED ORDER — ATORVASTATIN CALCIUM 40 MG PO TABS: 40.0000 mg | ORAL_TABLET | Freq: Every day | ORAL | 3 refills | Status: DC

## 2017-06-30 MED ORDER — CLOPIDOGREL BISULFATE 75 MG PO TABS: 75.0000 mg | ORAL_TABLET | Freq: Every day | ORAL | 0 refills | Status: DC

## 2017-06-30 MED ORDER — ASPIRIN 325 MG PO TABS: 325.0000 mg | ORAL_TABLET | Freq: Every day | ORAL | 3 refills | Status: DC

## 2017-06-30 MED FILL — ?ATORVASTATIN 40MG TABLET: 40 | 30 days supply | Qty: 30 | Fill #2

## 2017-06-30 MED FILL — LATANOPROST 0.005% EYE DRP: 0.005 | 25 days supply | Qty: 3 | Fill #1

## 2017-06-30 NOTE — Progress Notes (Signed)
Subjective:  Patient ID: Amy Howell, female    DOB: 1956-06-06  Age: 61 y.o. MRN: [id number]  CC: Hypertension   HPI Amy Howell presents for hypertension follow up. History of HTN, HLD, and CVA.  She is exercising and is adherent to low salt diet.  Blood pressure is well controlled at home. SBP at home range 92-110's and DBP 57-70's. Cardiac symptoms none. Patient denies chest pain, claudication, dyspnea, lower extremity edema, orthopnea, palpitations and syncope.  Cardiovascular risk factors: diabetes mellitus, dyslipidemia, hypertension and smoking/ tobacco exposure. Use of agents associated with hypertension: none. History of target organ damage: stroke. She is adherent with taking antihypertension, plavix, statin, and aspirin. She reports surgical intervention was rescheduled for September.    Outpatient Medications Prior to Visit  Medication Sig Dispense Refill  . latanoprost (XALATAN) 0.005 % ophthalmic solution Place 1 drop into both eyes at bedtime.   11  . amLODipine (NORVASC) 10 MG tablet Take 1 tablet (10 mg total) by mouth daily. 90 tablet 0  . aspirin 325 MG tablet Take 1 tablet (325 mg total) by mouth daily. 30 tablet 0  . atorvastatin (LIPITOR) 40 MG tablet Take 1 tablet (40 mg total) by mouth daily at 6 PM. 90 tablet 0  . clopidogrel (PLAVIX) 75 MG tablet Take 75 mg by mouth daily.  3  . acetaminophen (TYLENOL) 500 MG tablet Take 1,000 mg by mouth every 6 (six) hours as needed for headache (for pain.).     Marland Kitchen feeding supplement, ENSURE ENLIVE, (ENSURE ENLIVE) LIQD Take 237 mLs by mouth 2 (two) times daily.      No facility-administered medications prior to visit.     ROS Review of Systems  Constitutional: Negative.   Eyes: Negative.   Respiratory: Negative.   Cardiovascular: Negative.   Gastrointestinal: Negative.   Musculoskeletal: Negative.   Skin: Negative.   Neurological: Negative.   Psychiatric/Behavioral: Negative for suicidal ideas.   Objective:  BP  117/69 (BP Location: Left Arm, Patient Position: Sitting, Cuff Size: Normal)   Pulse 79   Temp 98.1 F (36.7 C) (Oral)   Resp 18   Ht 5' (1.524 m)   Wt 99 lb 9.6 oz (45.2 kg)   SpO2 99%   BMI 19.45 kg/m   BP/Weight 06/30/2017 02/07/9372 03/04/8767  Systolic BP 115 726 -  Diastolic BP 69 74 -  Wt. (Lbs) 99.6 - 96.12  BMI 19.45 - 18.77   Physical Exam  Constitutional: She is oriented to person, place, and time. She appears well-developed and well-nourished.  HENT:  Head: Normocephalic and atraumatic.  Right Ear: External ear normal.  Left Ear: External ear normal.  Nose: Nose normal.  Mouth/Throat: Oropharynx is clear and moist.  Eyes: Pupils are equal, round, and reactive to light. Conjunctivae are normal.  Neck: No JVD present.  Cardiovascular: Normal rate, regular rhythm, normal heart sounds and intact distal pulses.   Pulmonary/Chest: Effort normal and breath sounds normal.  Abdominal: Soft. Bowel sounds are normal.  Musculoskeletal: Normal range of motion.  Neurological: She is alert and oriented to person, place, and time. She has normal reflexes. Coordination normal.  Skin: Skin is warm and dry.  Psychiatric: She has a normal mood and affect.  Nursing note and vitals reviewed.    Assessment & Plan:   Problem List Items Addressed This Visit      Cardiovascular and Mediastinum   Stroke (cerebrum) ([place])   Relevant Medications   amLODipine (NORVASC) 10 MG tablet   atorvastatin (LIPITOR)  40 MG tablet   clopidogrel (PLAVIX) 75 MG tablet   aspirin 325 MG tablet   Essential hypertension - Primary   Relevant Medications   amLODipine (NORVASC) 10 MG tablet   atorvastatin (LIPITOR) 40 MG tablet   aspirin 325 MG tablet    Other Visit Diagnoses    Mixed hyperlipidemia       Relevant Medications   amLODipine (NORVASC) 10 MG tablet   atorvastatin (LIPITOR) 40 MG tablet   aspirin 325 MG tablet      Meds ordered this encounter  Medications  . amLODipine (NORVASC) 10  MG tablet    Sig: Take 1 tablet (10 mg total) by mouth daily.    Dispense:  90 tablet    Refill:  3    Must have office visit for refills.    Order Specific Question:   Supervising Provider    Answer:   Tresa Garter [id number]  . atorvastatin (LIPITOR) 40 MG tablet    Sig: Take 1 tablet (40 mg total) by mouth daily at 6 PM.    Dispense:  90 tablet    Refill:  3    Must have office visit for refills.    Order Specific Question:   Supervising Provider    Answer:   Tresa Garter [id number]  . DISCONTD: aspirin 325 MG tablet    Sig: Take 1 tablet (325 mg total) by mouth daily.    Dispense:  90 tablet    Refill:  3    Order Specific Question:   Supervising Provider    Answer:   Tresa Garter [id number]  . clopidogrel (PLAVIX) 75 MG tablet    Sig: Take 1 tablet (75 mg total) by mouth daily.    Dispense:  90 tablet    Refill:  0    Order Specific Question:   Supervising Provider    Answer:   Tresa Garter [id number]  . aspirin 325 MG tablet    Sig: Take 1 tablet (325 mg total) by mouth daily.    Dispense:  90 tablet    Refill:  3    Order Specific Question:   Supervising Provider    Answer:   Tresa Garter [id number]    Follow-up: Return in about 3 months (around 09/30/2017) for HTN/HLD.   Alfonse Spruce FNP

## 2017-06-30 NOTE — Progress Notes (Signed)
Patient is here for f/up htn   Patient complains blurry vision

## 2017-06-30 NOTE — Patient Instructions (Signed)
DASH Eating Plan DASH stands for "Dietary Approaches to Stop Hypertension." The DASH eating plan is a healthy eating plan that has been shown to reduce high blood pressure (hypertension). It may also reduce your risk for type 2 diabetes, heart disease, and stroke. The DASH eating plan may also help with weight loss. What are tips for following this plan? General guidelines  Avoid eating more than 2,300 mg (milligrams) of salt (sodium) a day. If you have hypertension, you may need to reduce your sodium intake to 1,500 mg a day.  Limit alcohol intake to no more than 1 drink a day for nonpregnant women and 2 drinks a day for men. One drink equals 12 oz of beer, 5 oz of wine, or 1 oz of hard liquor.  Work with your health care provider to maintain a healthy body weight or to lose weight. Ask what an ideal weight is for you.  Get at least 30 minutes of exercise that causes your heart to beat faster (aerobic exercise) most days of the week. Activities may include walking, swimming, or biking.  Work with your health care provider or diet and nutrition specialist (dietitian) to adjust your eating plan to your individual calorie needs. Reading food labels  Check food labels for the amount of sodium per serving. Choose foods with less than 5 percent of the Daily Value of sodium. Generally, foods with less than 300 mg of sodium per serving fit into this eating plan.  To find whole grains, look for the word "whole" as the first word in the ingredient list. Shopping  Buy products labeled as "low-sodium" or "no salt added."  Buy fresh foods. Avoid canned foods and premade or frozen meals. Cooking  Avoid adding salt when cooking. Use salt-free seasonings or herbs instead of table salt or sea salt. Check with your health care provider or pharmacist before using salt substitutes.  Do not fry foods. Cook foods using healthy methods such as baking, boiling, grilling, and broiling instead.  Cook with  heart-healthy oils, such as olive, canola, soybean, or sunflower oil. Meal planning   Eat a balanced diet that includes: ? 5 or more servings of fruits and vegetables each day. At each meal, try to fill half of your plate with fruits and vegetables. ? Up to 6-8 servings of whole grains each day. ? Less than 6 oz of lean meat, poultry, or fish each day. A 3-oz serving of meat is about the same size as a deck of cards. One egg equals 1 oz. ? 2 servings of low-fat dairy each day. ? A serving of nuts, seeds, or beans 5 times each week. ? Heart-healthy fats. Healthy fats called Omega-3 fatty acids are found in foods such as flaxseeds and coldwater fish, like sardines, salmon, and mackerel.  Limit how much you eat of the following: ? Canned or prepackaged foods. ? Food that is high in trans fat, such as fried foods. ? Food that is high in saturated fat, such as fatty meat. ? Sweets, desserts, sugary drinks, and other foods with added sugar. ? Full-fat dairy products.  Do not salt foods before eating.  Try to eat at least 2 vegetarian meals each week.  Eat more home-cooked food and less restaurant, buffet, and fast food.  When eating at a restaurant, ask that your food be prepared with less salt or no salt, if possible. What foods are recommended? The items listed may not be a complete list. Talk with your dietitian about what   dietary choices are best for you. Grains Whole-grain or whole-wheat bread. Whole-grain or whole-wheat pasta. Brown rice. Oatmeal. Quinoa. Bulgur. Whole-grain and low-sodium cereals. Pita bread. Low-fat, low-sodium crackers. Whole-wheat flour tortillas. Vegetables Fresh or frozen vegetables (raw, steamed, roasted, or grilled). Low-sodium or reduced-sodium tomato and vegetable juice. Low-sodium or reduced-sodium tomato sauce and tomato paste. Low-sodium or reduced-sodium canned vegetables. Fruits All fresh, dried, or frozen fruit. Canned fruit in natural juice (without  added sugar). Meat and other protein foods Skinless chicken or turkey. Ground chicken or turkey. Pork with fat trimmed off. Fish and seafood. Egg whites. Dried beans, peas, or lentils. Unsalted nuts, nut butters, and seeds. Unsalted canned beans. Lean cuts of beef with fat trimmed off. Low-sodium, lean deli meat. Dairy Low-fat (1%) or fat-free (skim) milk. Fat-free, low-fat, or reduced-fat cheeses. Nonfat, low-sodium ricotta or cottage cheese. Low-fat or nonfat yogurt. Low-fat, low-sodium cheese. Fats and oils Soft margarine without trans fats. Vegetable oil. Low-fat, reduced-fat, or light mayonnaise and salad dressings (reduced-sodium). Canola, safflower, olive, soybean, and sunflower oils. Avocado. Seasoning and other foods Herbs. Spices. Seasoning mixes without salt. Unsalted popcorn and pretzels. Fat-free sweets. What foods are not recommended? The items listed may not be a complete list. Talk with your dietitian about what dietary choices are best for you. Grains Baked goods made with fat, such as croissants, muffins, or some breads. Dry pasta or rice meal packs. Vegetables Creamed or fried vegetables. Vegetables in a cheese sauce. Regular canned vegetables (not low-sodium or reduced-sodium). Regular canned tomato sauce and paste (not low-sodium or reduced-sodium). Regular tomato and vegetable juice (not low-sodium or reduced-sodium). Pickles. Olives. Fruits Canned fruit in a light or heavy syrup. Fried fruit. Fruit in cream or butter sauce. Meat and other protein foods Fatty cuts of meat. Ribs. Fried meat. Bacon. Sausage. Bologna and other processed lunch meats. Salami. Fatback. Hotdogs. Bratwurst. Salted nuts and seeds. Canned beans with added salt. Canned or smoked fish. Whole eggs or egg yolks. Chicken or turkey with skin. Dairy Whole or 2% milk, cream, and half-and-half. Whole or full-fat cream cheese. Whole-fat or sweetened yogurt. Full-fat cheese. Nondairy creamers. Whipped toppings.  Processed cheese and cheese spreads. Fats and oils Butter. Stick margarine. Lard. Shortening. Ghee. Bacon fat. Tropical oils, such as coconut, palm kernel, or palm oil. Seasoning and other foods Salted popcorn and pretzels. Onion salt, garlic salt, seasoned salt, table salt, and sea salt. Worcestershire sauce. Tartar sauce. Barbecue sauce. Teriyaki sauce. Soy sauce, including reduced-sodium. Steak sauce. Canned and packaged gravies. Fish sauce. Oyster sauce. Cocktail sauce. Horseradish that you find on the shelf. Ketchup. Mustard. Meat flavorings and tenderizers. Bouillon cubes. Hot sauce and Tabasco sauce. Premade or packaged marinades. Premade or packaged taco seasonings. Relishes. Regular salad dressings. Where to find more information:  National Heart, Lung, and Blood Institute: www.nhlbi.nih.gov  American Heart Association: www.heart.org Summary  The DASH eating plan is a healthy eating plan that has been shown to reduce high blood pressure (hypertension). It may also reduce your risk for type 2 diabetes, heart disease, and stroke.  With the DASH eating plan, you should limit salt (sodium) intake to 2,300 mg a day. If you have hypertension, you may need to reduce your sodium intake to 1,500 mg a day.  When on the DASH eating plan, aim to eat more fresh fruits and vegetables, whole grains, lean proteins, low-fat dairy, and heart-healthy fats.  Work with your health care provider or diet and nutrition specialist (dietitian) to adjust your eating plan to your individual   calorie needs. This information is not intended to replace advice given to you by your health care provider. Make sure you discuss any questions you have with your health care provider. Document Released: 11/07/2011 Document Revised: 11/11/2016 Document Reviewed: 11/11/2016 Elsevier Interactive Patient Education  2017 Elsevier Inc.  

## 2017-07-10 LAB — CUP PACEART REMOTE DEVICE CHECK
Date Time Interrogation Session: 20180726203811
MDC IDC PG IMPLANT DT: 20180427

## 2017-07-22 ENCOUNTER — Other Ambulatory Visit: Payer: Self-pay | Admitting: Family Medicine

## 2017-07-22 DIAGNOSIS — E782 Mixed hyperlipidemia: Secondary | ICD-10-CM

## 2017-07-22 MED FILL — CLOPIDOGREL 75 MG TABLET: 75 | 30 days supply | Qty: 30 | Fill #3

## 2017-07-25 MED FILL — AMLODIPINE BESYLATE 10 MG T: 10 | 30 days supply | Qty: 30 | Fill #0

## 2017-07-28 ENCOUNTER — Ambulatory Visit (INDEPENDENT_AMBULATORY_CARE_PROVIDER_SITE_OTHER): Payer: Self-pay | Admitting: *Deleted

## 2017-07-28 DIAGNOSIS — I639 Cerebral infarction, unspecified: Secondary | ICD-10-CM

## 2017-07-28 NOTE — Progress Notes (Signed)
Carelink Summary Report / Loop Recorder 

## 2017-07-30 MED FILL — ?ATORVASTATIN 40MG TABLET: 40 | 30 days supply | Qty: 30 | Fill #0

## 2017-07-31 NOTE — Pre-Procedure Instructions (Signed)
Amy Howell  07/31/2017      [id number] - [place], [place] [address] [place] [address] Phone: [phone] Fax: [place], [address] [place] [address] Phone: [phone] Fax: [phone]    Your procedure is scheduled on Wednesday August 06, 2017  Report to [facility] Admitting Entrance "A" at 7:00 A.M.   Call this number if you have problems the morning of surgery:  [phone]   Remember:  Do not eat food or drink liquids after midnight on August 05, 2017  Take these medicines the morning of surgery with A SIP OF WATER: AmLODipine (NORVASC). If needed Acetaminophen (TYLENOL) for pain.  As of today, stop taking all Aspirins, Vitamins, Fish oils, and Herbal medications. Also stop all NSAIDS i.e. Advil, Motrin, Aleve, Anaprox, Naproxen, BC and Goody Powders.   Do not wear jewelry, make-up or nail polish.  Do not wear lotions, powders, or perfumes, or deodorant.  Do not shave 48 hours prior to surgery.    Do not bring valuables to the hospital.  [facility] is not responsible for any belongings or valuables.  Contacts, dentures or bridgework may not be worn into surgery.  Leave your suitcase in the car.  After surgery it may be brought to your room.  For patients admitted to the hospital, discharge time will be determined by your treatment team.  Patients discharged the day of surgery will not be allowed to drive home.   Special instructions:   [facility]- Preparing For Surgery  Before surgery, you can play an important role. Because skin is not sterile, your skin needs to be as free of germs as possible. You can reduce the number of germs on your skin by washing with CHG (chlorahexidine gluconate) Soap before surgery.  CHG is an antiseptic cleaner which kills germs and bonds with the skin  to continue killing germs even after washing.  Please do not use if you have an allergy to CHG or antibacterial soaps. If your skin becomes reddened/irritated stop using the CHG.  Do not shave (including legs and underarms) for at least 48 hours prior to first CHG shower. It is OK to shave your face.  Please follow these instructions carefully.   1. Shower the NIGHT BEFORE SURGERY and the MORNING OF SURGERY with CHG.   2. If you chose to wash your hair, wash your hair first as usual with your normal shampoo.  3. After you shampoo, rinse your hair and body thoroughly to remove the shampoo.  4. Use CHG as you would any other liquid soap. You can apply CHG directly to the skin and wash gently with a scrungie or a clean washcloth.   5. Apply the CHG Soap to your body ONLY FROM THE NECK DOWN.  Do not use on open wounds or open sores. Avoid contact with your eyes, ears, mouth and genitals (private parts). Wash genitals (private parts) with your normal soap.  6. Wash thoroughly, paying special attention to the area where your surgery will be performed.  7. Thoroughly rinse your body with warm water from the neck down.  8. DO NOT shower/wash with your normal soap after using and rinsing off the CHG Soap.  9. Pat yourself dry with a CLEAN TOWEL.   10. Wear CLEAN PAJAMAS   11. Place CLEAN SHEETS on your  bed the night of your first shower and DO NOT SLEEP WITH PETS.  Day of Surgery: Do not apply any deodorants/lotions. Please wear clean clothes to the hospital/surgery center.    Please read over the following fact sheets that you were given. Pain Booklet, Coughing and Deep Breathing and Surgical Site Infection Prevention

## 2017-08-01 ENCOUNTER — Encounter (HOSPITAL_COMMUNITY): Payer: Self-pay

## 2017-08-01 ENCOUNTER — Other Ambulatory Visit: Payer: Self-pay | Admitting: Radiology

## 2017-08-01 ENCOUNTER — Encounter (HOSPITAL_COMMUNITY)
Admission: RE | Admit: 2017-08-01 | Discharge: 2017-08-01 | Disposition: A | Discharge status: Home / Self Care | Payer: Self-pay | Source: Ambulatory Visit | Attending: Interventional Radiology | Admitting: Interventional Radiology

## 2017-08-01 DIAGNOSIS — F17200 Nicotine dependence, unspecified, uncomplicated: Secondary | ICD-10-CM | POA: Insufficient documentation

## 2017-08-01 DIAGNOSIS — R7303 Prediabetes: Secondary | ICD-10-CM | POA: Insufficient documentation

## 2017-08-01 DIAGNOSIS — Z8673 Personal history of transient ischemic attack (TIA), and cerebral infarction without residual deficits: Secondary | ICD-10-CM | POA: Insufficient documentation

## 2017-08-01 DIAGNOSIS — I671 Cerebral aneurysm, nonruptured: Secondary | ICD-10-CM | POA: Insufficient documentation

## 2017-08-01 DIAGNOSIS — I651 Occlusion and stenosis of basilar artery: Secondary | ICD-10-CM | POA: Insufficient documentation

## 2017-08-01 DIAGNOSIS — I10 Essential (primary) hypertension: Secondary | ICD-10-CM | POA: Insufficient documentation

## 2017-08-01 DIAGNOSIS — Z01812 Encounter for preprocedural laboratory examination: Secondary | ICD-10-CM | POA: Insufficient documentation

## 2017-08-01 HISTORY — DX: Cerebral infarction due to unspecified occlusion or stenosis of unspecified carotid artery: I63.239

## 2017-08-01 LAB — CBC WITH DIFFERENTIAL/PLATELET
Basophils Absolute: 0 10*3/uL (ref 0.0–0.1)
Basophils Relative: 0 %
EOS PCT: 4 %
Eosinophils Absolute: 0.2 10*3/uL (ref 0.0–0.7)
HCT: 38.9 % (ref 36.0–46.0)
Hemoglobin: 12.1 g/dL (ref 12.0–15.0)
LYMPHS PCT: 25 %
Lymphs Abs: 1.6 10*3/uL (ref 0.7–4.0)
MCH: 28.9 pg (ref 26.0–34.0)
MCHC: 31.1 g/dL (ref 30.0–36.0)
MCV: 93.1 fL (ref 78.0–100.0)
MONO ABS: 0.3 10*3/uL (ref 0.1–1.0)
Monocytes Relative: 5 %
Neutro Abs: 4 10*3/uL (ref 1.7–7.7)
Neutrophils Relative %: 66 %
PLATELETS: 163 10*3/uL (ref 150–400)
RBC: 4.18 MIL/uL (ref 3.87–5.11)
RDW: 13.8 % (ref 11.5–15.5)
WBC: 6.2 10*3/uL (ref 4.0–10.5)

## 2017-08-01 LAB — BASIC METABOLIC PANEL
Anion gap: 8 (ref 5–15)
BUN: 20 mg/dL (ref 6–20)
CO2: 25 mmol/L (ref 22–32)
CREATININE: 0.83 mg/dL (ref 0.44–1.00)
Calcium: 9.4 mg/dL (ref 8.9–10.3)
Chloride: 106 mmol/L (ref 101–111)
GFR calc Af Amer: >60 mL/min (ref >60)
GFR calc non Af Amer: >60 mL/min (ref >60)
Glucose, Bld: 78 mg/dL (ref 65–99)
POTASSIUM: 4 mmol/L (ref 3.5–5.1)
Sodium: 139 mmol/L (ref 135–145)

## 2017-08-01 LAB — PROTIME-INR
INR: 0.97
Prothrombin Time: 12.8 seconds (ref 11.4–15.2)

## 2017-08-01 LAB — CUP PACEART REMOTE DEVICE CHECK
Date Time Interrogation Session: 20180825203904
MDC IDC PG IMPLANT DT: 20180427

## 2017-08-01 LAB — APTT: aPTT: 27 seconds (ref 24–36)

## 2017-08-01 LAB — PLATELET INHIBITION P2Y12: Platelet Function  P2Y12: 221 [PRU] (ref 194–418)

## 2017-08-01 NOTE — Progress Notes (Signed)
Amy Howell            07/31/2017                          [id number] [place], [place] [address] [place] [address] Phone: [phone] Fax: [place], [address] [place] [address] Phone: [phone] Fax: [phone]              Your procedure is scheduled on Wednesday August 06, 2017            Report to [facility] Admitting Entrance "A" at 7:00 A.M.             Call this number if you have problems the morning of surgery:            [phone]             Remember:            Do not eat food or drink liquids after midnight on August 05, 2017            Take these medicines the morning of surgery with A SIP OF WATER: AmLODipine (NORVASC), Aspirin, and Clopidogrel (PLAVIX). If needed Acetaminophen (TYLENOL) for pain.  As of today, stop taking all Aspirins, Vitamins, Fish oils, and Herbal medications. Also stop all NSAIDS i.e. Advil, Motrin, Aleve, Anaprox, Naproxen, BC and Goody Powders.             Do not wear jewelry, make-up or nail polish.            Do not wear lotions, powders, or perfumes, or deodorant.            Do not shave 48 hours prior to surgery.              Do not bring valuables to the hospital.            [facility] is not responsible for any belongings or valuables.  Contacts, dentures or bridgework may not be worn into surgery.  Leave your suitcase in the car.  After surgery it may be brought to your room.  For patients admitted to the hospital, discharge time will be determined by your treatment team.  Patients discharged the day of surgery will not be allowed to drive home.   Special instructions:   [place]- Preparing For Surgery  Before surgery, you can play an important role. Because skin is not sterile, your skin needs to be as free of germs as  possible. You can reduce the number of germs on your skin by washing with CHG (chlorahexidine gluconate) Soap before surgery.  CHG is an antiseptic cleaner which kills germs and bonds with the skin to continue killing germs even after washing.  Please do not use if you have an allergy to CHG or antibacterial soaps. If your skin becomes reddened/irritated stop using the CHG.  Do not shave (including legs and underarms) for at least 48 hours prior to first CHG shower. It is OK to shave your face.  Please follow these instructions carefully.  1. Shower the NIGHT BEFORE SURGERY and the MORNING OF SURGERY with CHG.   2. If you chose to wash your hair, wash your hair first as usual with your normal shampoo.  3. After you shampoo, rinse your hair and body thoroughly to remove the shampoo.  4. Use CHG as you would any other liquid soap. You can apply CHG directly to the skin and wash gently with a scrungie or a clean washcloth.   5. Apply the CHG Soap to your body ONLY FROM THE NECK DOWN.  Do not use on open wounds or open sores. Avoid contact with your eyes, ears, mouth and genitals (private parts). Wash genitals (private parts) with your normal soap.  6. Wash thoroughly, paying special attention to the area where your surgery will be performed.  7. Thoroughly rinse your body with warm water from the neck down.  8. DO NOT shower/wash with your normal soap after using and rinsing off the CHG Soap.  9. Pat yourself dry with a CLEAN TOWEL.   10. Wear CLEAN PAJAMAS   11. Place CLEAN SHEETS on your bed the night of your first shower and DO NOT SLEEP WITH PETS.  Day of Surgery: Do not apply any deodorants/lotions. Please wear clean clothes to the hospital/surgery center.    Please read over the following fact sheets that you were given. Pain Booklet, Coughing and Deep  Breathing and Surgical Site Infection Prevention

## 2017-08-01 NOTE — Progress Notes (Signed)
PCP - Dr. Braulio Conte  Cardiologist - Dr. Garlon Hatchet  Chest x-ray - Denies  EKG - 03/27/17 (E)  Stress Test - Denies  ECHO - 03/27/17 (E)  Cardiac Cath - Denies  Sleep Study - Denies CPAP - Denies  Chart will be given to anesthesia for review due to pt having an ICD. Forms faxed to the [facility]. Copy of device in her chart.  Pt denies having chest pain, sob, or fever at this time. All instructions explained to the pt, with a verbal understanding of the material. Pt agrees to go over the instructions while at home for a better understanding. The opportunity to ask questions was provided.

## 2017-08-05 NOTE — Progress Notes (Signed)
Anesthesia Chart Review:  Pt is a 61 year old female scheduled for cerebral arteriogram, possible angioplasty/stent of basilar artery on 08/06/2017 with Luanne Bras, MD  History includes smoking, hypertension, vertigo, headaches, glaucoma, cataracts, CVA (subacute presentation 03/26/17), loop recorder 03/28/17, hysterectomy. 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   - PCP is Fredia Beets, FNP, last visit 06/30/17  - EP cardiologist is Dr. Thompson Grayer seen in consultation 03/28/17. Loop recorder (for monitoring for afib) ultimately recommended since TEE was "unrevealing" for CVA etiology. Loop recorder check on 07/28/17 showed normal device function, no new symptom episodes, tachycardia episodes, bradycardia, or pause episodes. No new a-fib episodes. - Saw neurologist Dr. Rosalin Hawking during hospitalization; did not show up for follow-up with Dr. Sarina Ill 05/12/17.  Meds include amlodipine, aspirin 325 mg, Lipitor, Plavix, Xalatan ophthalmic.  BP (!) 142/65   Pulse 79   Temp 36.7 C   Resp 20   Ht 5' 0.5" (1.537 m)   Wt 99 lb 9.6 oz (45.2 kg)   SpO2 97%   BMI 19.13 kg/m    EKG 03/27/17: NSR, possible LAE, LVH, early repolarization. No comparison tracings available.   TEE 03/28/17: Study Conclusions - Left ventricle: Hypertrophy was noted. Systolic function wasvigorous. The estimated ejection fraction was in the range of 65%to 70%. Wall motion was normal; there were no regional wallmotion abnormalities. - Aortic valve: No evidence of vegetation. There was mild regurgitation. - Mitral valve: No evidence of vegetation. - Left atrium: No evidence of thrombus in the atrial cavity orappendage. - Atrial septum: No defect or patent foramen ovale was identified. - Tricuspid valve: No evidence of vegetation. - Pulmonic valve: No evidence of vegetation. Impressions: - Negative saline microcavitation study.  TTE 03/27/17: Study Conclusions - Left ventricle: GLLS is normal  at -19.9% The cavity size wasnormal. There was mild concentric hypertrophy. Systolic functionwas vigorous. The estimated ejection fraction was in the range of65% to 70%. Wall motion was normal; there were no regional wallmotion abnormalities. There was an increased relativecontribution of atrial contraction to ventricular filling. Doppler parameters are consistent with abnormal left ventricularrelaxation (grade 1 diastolic dysfunction). - Aortic valve: Trileaflet; mildly thickened, mildly calcified leaflets. There was mild regurgitation. - Tricuspid valve: There was mild regurgitation.  Cerebral angiogram/4V cerebral arteriogram 04/23/17: Findings. - 5.67mmx 4.21mm prox RT MCA aneurysm,and 4.38mmx 3.30mm distal RT MCA aneurysm. - Severe distal basilar artery ,and Severe mid basilar stenosis.  CTA head/neck 03/26/17: IMPRESSION: 1. No emergent large vessel occlusion of the intracranial circulation. 2. 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 3. 3 x 4 mm inferiorly directed aneurysm arising from the bifurcation of the right middle cerebral artery. 4. 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 Both PCAs are of normal caliber. 5. No occlusion, dissection or hemodynamically significant stenosis of the cervical arteries.  MRI brain 03/27/17: IMPRESSION: 1. Subacute infarcts in the bilateral occipital poles. Small more acute infarcts may be superimposed. Patient has other small remote posterior circulation infarcts. 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 2. Known cerebral aneurysms.  CTA chest/abd/pelvis 03/27/17: IMPRESSION: 1. No CT findings to explain the patient's weight loss. 2. No acute cardiothoracic, abdominal or pelvic findings. 3. Three-vessel coronary arteriosclerosis. 4. Moderate colonic stool  burden with colonic diverticulosis. No acute bowel inflammation. Normal appendix.  Preoperative labs reviewed.  Monia Sabal, PA has  also reviewed labs.   Pt tolerated similar procedure 05/07/17 without issue.   If no changes, I anticipate pt can proceed with surgery as scheduled.   Willeen Cass, FNP-BC [facility] Short Stay Surgical Center/Anesthesiology Phone: [phone] 08/05/2017 3:19 PM

## 2017-08-05 NOTE — Anesthesia Preprocedure Evaluation (Addendum)
Anesthesia Evaluation  Patient identified by MRN, date of birth, ID band Patient awake    Reviewed: Allergy & Precautions, H&P , NPO status , Patient's Chart, lab work & pertinent test results  Airway Mallampati: II  TM Distance: >3 FB Neck ROM: Full    Dental no notable dental hx. (+) Teeth Intact, Dental Advisory Given   Pulmonary Current Smoker,    Pulmonary exam normal breath sounds clear to auscultation       Cardiovascular Exercise Tolerance: Good hypertension, Pt. on medications  Rhythm:Regular Rate:Normal     Neuro/Psych  Headaches, CVA, Residual Symptoms negative psych ROS   GI/Hepatic negative GI ROS, Neg liver ROS,   Endo/Other  negative endocrine ROS  Renal/GU negative Renal ROS  negative genitourinary   Musculoskeletal   Abdominal   Peds  Hematology negative hematology ROS (+)   Anesthesia Other Findings   Reproductive/Obstetrics negative OB ROS                            Anesthesia Physical Anesthesia Plan  ASA: III  Anesthesia Plan: MAC   Post-op Pain Management:    Induction: Intravenous  PONV Risk Score and Plan: 3 and Ondansetron, Dexamethasone and Midazolam  Airway Management Planned: Simple Face Mask  Additional Equipment:   Intra-op Plan:   Post-operative Plan:   Informed Consent: I have reviewed the patients History and Physical, chart, labs and discussed the procedure including the risks, benefits and alternatives for the proposed anesthesia with the patient or authorized representative who has indicated his/her understanding and acceptance.   Dental advisory given  Plan Discussed with: CRNA  Anesthesia Plan Comments:        Anesthesia Quick Evaluation

## 2017-08-06 ENCOUNTER — Encounter (HOSPITAL_COMMUNITY)
Admission: RE | Disposition: A | Discharge status: Home / Self Care | Payer: Self-pay | Source: Ambulatory Visit | Attending: Interventional Radiology

## 2017-08-06 ENCOUNTER — Encounter (HOSPITAL_COMMUNITY): Payer: Self-pay | Admitting: Certified Registered Nurse Anesthetist

## 2017-08-06 ENCOUNTER — Ambulatory Visit (HOSPITAL_COMMUNITY): Payer: Self-pay | Admitting: Anesthesiology

## 2017-08-06 ENCOUNTER — Ambulatory Visit (HOSPITAL_COMMUNITY): Payer: Self-pay | Admitting: Emergency Medicine

## 2017-08-06 ENCOUNTER — Ambulatory Visit (HOSPITAL_COMMUNITY)
Admission: RE | Admit: 2017-08-06 | Discharge: 2017-08-06 | Disposition: A | Discharge status: Home / Self Care | Payer: Self-pay | Source: Ambulatory Visit | Attending: Interventional Radiology | Admitting: Interventional Radiology

## 2017-08-06 ENCOUNTER — Other Ambulatory Visit: Payer: Self-pay | Admitting: Family Medicine

## 2017-08-06 DIAGNOSIS — Z48812 Encounter for surgical aftercare following surgery on the circulatory system: Secondary | ICD-10-CM | POA: Insufficient documentation

## 2017-08-06 DIAGNOSIS — I671 Cerebral aneurysm, nonruptured: Secondary | ICD-10-CM | POA: Insufficient documentation

## 2017-08-06 DIAGNOSIS — I6502 Occlusion and stenosis of left vertebral artery: Secondary | ICD-10-CM | POA: Insufficient documentation

## 2017-08-06 DIAGNOSIS — H409 Unspecified glaucoma: Secondary | ICD-10-CM | POA: Insufficient documentation

## 2017-08-06 DIAGNOSIS — Z95810 Presence of automatic (implantable) cardiac defibrillator: Secondary | ICD-10-CM | POA: Insufficient documentation

## 2017-08-06 DIAGNOSIS — I10 Essential (primary) hypertension: Secondary | ICD-10-CM

## 2017-08-06 DIAGNOSIS — I771 Stricture of artery: Secondary | ICD-10-CM

## 2017-08-06 DIAGNOSIS — Z8673 Personal history of transient ischemic attack (TIA), and cerebral infarction without residual deficits: Secondary | ICD-10-CM | POA: Insufficient documentation

## 2017-08-06 DIAGNOSIS — I651 Occlusion and stenosis of basilar artery: Secondary | ICD-10-CM | POA: Insufficient documentation

## 2017-08-06 DIAGNOSIS — I6521 Occlusion and stenosis of right carotid artery: Secondary | ICD-10-CM | POA: Insufficient documentation

## 2017-08-06 DIAGNOSIS — Z7982 Long term (current) use of aspirin: Secondary | ICD-10-CM | POA: Insufficient documentation

## 2017-08-06 DIAGNOSIS — F17210 Nicotine dependence, cigarettes, uncomplicated: Secondary | ICD-10-CM | POA: Insufficient documentation

## 2017-08-06 DIAGNOSIS — Z7902 Long term (current) use of antithrombotics/antiplatelets: Secondary | ICD-10-CM | POA: Insufficient documentation

## 2017-08-06 DIAGNOSIS — I663 Occlusion and stenosis of cerebellar arteries: Secondary | ICD-10-CM | POA: Insufficient documentation

## 2017-08-06 DIAGNOSIS — I6601 Occlusion and stenosis of right middle cerebral artery: Secondary | ICD-10-CM | POA: Insufficient documentation

## 2017-08-06 HISTORY — PX: IR ANGIO VERTEBRAL SEL VERTEBRAL BILAT MOD SED: IMG5369

## 2017-08-06 HISTORY — PX: RADIOLOGY WITH ANESTHESIA: SHX6223

## 2017-08-06 HISTORY — PX: IR ANGIO INTRA EXTRACRAN SEL COM CAROTID INNOMINATE BILAT MOD SED: IMG5360

## 2017-08-06 LAB — PLATELET INHIBITION P2Y12: Platelet Function  P2Y12: 214 [PRU] (ref 194–418)

## 2017-08-06 SURGERY — RADIOLOGY WITH ANESTHESIA
Anesthesia: General

## 2017-08-06 MED ORDER — NIMODIPINE 30 MG PO CAPS
0.0000 mg | ORAL_CAPSULE | ORAL | Status: AC
  Administered 2017-08-06: 60 mg via ORAL

## 2017-08-06 MED ORDER — ASPIRIN EC 325 MG PO TBEC
DELAYED_RELEASE_TABLET | ORAL | Status: AC
  Filled 2017-08-06: qty 1

## 2017-08-06 MED ORDER — FENTANYL CITRATE (PF) 100 MCG/2ML IJ SOLN
INTRAMUSCULAR | Status: DC | PRN
  Administered 2017-08-06: 25 ug via INTRAVENOUS
  Administered 2017-08-06: 50 ug via INTRAVENOUS
  Administered 2017-08-06: 25 ug via INTRAVENOUS

## 2017-08-06 MED ORDER — IOPAMIDOL (ISOVUE-300) INJECTION 61%
INTRAVENOUS | Status: AC
  Administered 2017-08-06: 5 mL
  Filled 2017-08-06: qty 50

## 2017-08-06 MED ORDER — MIDAZOLAM HCL 5 MG/5ML IJ SOLN
INTRAMUSCULAR | Status: DC | PRN
  Administered 2017-08-06: 2 mg via INTRAVENOUS

## 2017-08-06 MED ORDER — SODIUM CHLORIDE 0.9 % IV SOLN: INTRAVENOUS | Status: AC

## 2017-08-06 MED ORDER — PROPOFOL 10 MG/ML IV BOLUS
INTRAVENOUS | Status: AC
Start: 2017-08-06 — End: 2017-08-06
  Filled 2017-08-06: qty 20

## 2017-08-06 MED ORDER — LACTATED RINGERS IV SOLN
INTRAVENOUS | Status: DC
Start: 2017-08-06 — End: 2017-08-06
  Administered 2017-08-06: 09:00:00 via INTRAVENOUS

## 2017-08-06 MED ORDER — FENTANYL CITRATE (PF) 250 MCG/5ML IJ SOLN
INTRAMUSCULAR | Status: AC
  Filled 2017-08-06: qty 5

## 2017-08-06 MED ORDER — HEPARIN SODIUM (PORCINE) 1000 UNIT/ML IJ SOLN
INTRAMUSCULAR | Status: DC | PRN
  Administered 2017-08-06: 1000 [IU] via INTRAVENOUS

## 2017-08-06 MED ORDER — CLOPIDOGREL BISULFATE 75 MG PO TABS: 75.0000 mg | ORAL_TABLET | ORAL | Status: DC

## 2017-08-06 MED ORDER — NIMODIPINE 30 MG PO CAPS
ORAL_CAPSULE | ORAL | Status: AC
  Administered 2017-08-06: 60 mg via ORAL
  Filled 2017-08-06: qty 2

## 2017-08-06 MED ORDER — CEFAZOLIN SODIUM-DEXTROSE 2-4 GM/100ML-% IV SOLN
2.0000 g | INTRAVENOUS | Status: DC
  Filled 2017-08-06: qty 100

## 2017-08-06 MED ORDER — LIDOCAINE HCL 1 % IJ SOLN
INTRAMUSCULAR | Status: DC | PRN
  Administered 2017-08-06: 10 mL

## 2017-08-06 MED ORDER — MIDAZOLAM HCL 2 MG/2ML IJ SOLN
INTRAMUSCULAR | Status: AC
Start: 2017-08-06 — End: 2017-08-06
  Filled 2017-08-06: qty 4

## 2017-08-06 MED ORDER — NITROGLYCERIN 1 MG/10 ML FOR IR/CATH LAB
INTRA_ARTERIAL | Status: AC
  Filled 2017-08-06: qty 10

## 2017-08-06 MED ORDER — LIDOCAINE HCL (PF) 1 % IJ SOLN
INTRAMUSCULAR | Status: AC
  Filled 2017-08-06: qty 30

## 2017-08-06 MED ORDER — IOPAMIDOL (ISOVUE-300) INJECTION 61%
INTRAVENOUS | Status: AC
  Administered 2017-08-06: 75 mL
  Filled 2017-08-06: qty 150

## 2017-08-06 MED ORDER — ASPIRIN EC 325 MG PO TBEC
325.0000 mg | DELAYED_RELEASE_TABLET | ORAL | Status: DC
  Filled 2017-08-06: qty 1

## 2017-08-06 NOTE — Anesthesia Postprocedure Evaluation (Signed)
Anesthesia Post Note  Patient: Amy Howell  Procedure(s) Performed: Procedure(s) (LRB): RADIOLOGY WITH ANESTHESIA STENTING (N/A)     Patient location during evaluation: PACU Anesthesia Type: General Level of consciousness: awake and alert Pain management: pain level controlled Vital Signs Assessment: post-procedure vital signs reviewed and stable Respiratory status: spontaneous breathing, nonlabored ventilation and respiratory function stable Cardiovascular status: stable and blood pressure returned to baseline Anesthetic complications: no    Last Vitals:  Vitals:   08/06/17 0713  BP: (!) 174/81  Pulse: 69  Resp: 18  Temp: 36.7 C  SpO2: 100%    Last Pain:  Vitals:   08/06/17 0713  TempSrc: Oral                 Jenniffer Vessels,W. EDMOND

## 2017-08-06 NOTE — Consult Note (Signed)
Chief Complaint: Patient was seen in consultation today for MCA aneurysm  Supervising Physician: Luanne Bras  Patient Status: [facility] - Out-pt  History of Present Illness: Amy Howell is a 61 y.o. 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 Her last intervention was stent placement 05/07/17.   She was seen in consultation with Dr. Estanislado Pandy 05/29/17 and returns for surveillance and possible further intervention today.  She has been in her usual state of health.  She denies symptoms or complaints today.  She has been NPO and does not take blood thinners.  Per her and daughter reports she is not smoking and continues to take her Plavix daily.   Past Medical History:  Diagnosis Date  . Cataracts, bilateral   . Cerebral infarction due to carotid artery stenosis ([place])   . Glaucoma   . Headache   . Hypertension   . Stroke [facility])    no residual, "series of mini strokes"  . Vertigo     Past Surgical History:  Procedure Laterality Date  . ABDOMINAL HYSTERECTOMY    . CARDIAC DEFIBRILLATOR PLACEMENT    . gum operation    . IR 3D INDEPENDENT WKST  05/07/2017  . IR ANGIO INTRA EXTRACRAN SEL COM CAROTID INNOMINATE BILAT MOD SED  04/23/2017  . IR ANGIO INTRA EXTRACRAN SEL COM CAROTID INNOMINATE UNI R MOD SED  05/07/2017  . IR ANGIO VERTEBRAL SEL VERTEBRAL BILAT MOD SED  04/23/2017  . IR ANGIOGRAM FOLLOW UP STUDY  05/07/2017  . IR ANGIOGRAM SELECTIVE EACH ADDITIONAL VESSEL  05/07/2017  . IR RADIOLOGIST EVAL & MGMT  04/16/2017  . IR RADIOLOGIST EVAL & MGMT  05/29/2017  . IR TRANSCATH/EMBOLIZ  05/07/2017  . LOOP RECORDER INSERTION N/A 03/28/2017   Procedure: Loop Recorder Insertion;  Surgeon: Thompson Grayer, MD;  Location: [place] CV LAB;  Service: Cardiovascular;  Laterality: N/A;  . RADIOLOGY WITH ANESTHESIA N/A 05/07/2017   Procedure: EMBOLIZATION;  Surgeon: Luanne Bras, MD;   Location: [place];  Service: Radiology;  Laterality: N/A;  . TEE WITHOUT CARDIOVERSION N/A 03/28/2017   Procedure: TRANSESOPHAGEAL ECHOCARDIOGRAM (TEE);  Surgeon: Lelon Perla, MD;  Location: [facility] ENDOSCOPY;  Service: Cardiovascular;  Laterality: N/A;    Allergies: Patient has no known allergies.  Medications: Prior to Admission medications   Medication Sig Start Date End Date Taking? Authorizing Provider  acetaminophen (TYLENOL) 500 MG tablet Take 1,000 mg by mouth daily as needed (for pain.).     [provider]  amLODipine (NORVASC) 10 MG tablet Take 1 tablet (10 mg total) by mouth daily. 06/30/17   Alfonse Spruce, FNP  aspirin 325 MG tablet Take 1 tablet (325 mg total) by mouth daily. 06/30/17   Alfonse Spruce, FNP  atorvastatin (LIPITOR) 40 MG tablet Take 1 tablet (40 mg total) by mouth daily at 6 PM. 06/30/17   Alfonse Spruce, FNP  clopidogrel (PLAVIX) 75 MG tablet Take 1 tablet (75 mg total) by mouth daily. 06/30/17   Alfonse Spruce, FNP  feeding supplement, ENSURE ENLIVE, (ENSURE ENLIVE) LIQD Take 237 mLs by mouth 2 (two) times daily.     [provider]  latanoprost (XALATAN) 0.005 % ophthalmic solution Place 1 drop into both eyes at bedtime.  03/26/17   [provider]     Family History  Problem Relation Age of Onset  . Hypertension Father   . Diabetes Father  Social History   Social History  . Marital status: Single    Spouse name: N/A  . Number of children: N/A  . Years of education: N/A   Social History Main Topics  . Smoking status: Current Every Day Smoker    Packs/day: 0.25    Types: Cigarettes  . Smokeless tobacco: Never Used     Comment: 1 cigarette a day   . Alcohol use No  . Drug use: No  . Sexual activity: Not on file   Other Topics Concern  . Not on file   Social History Narrative  . No narrative on file    Review of Systems  Constitutional: Negative for fatigue and fever.  Respiratory:  Negative for cough and shortness of breath.   Psychiatric/Behavioral: Negative for behavioral problems and confusion.    Vital Signs: There were no vitals taken for this visit.  Physical Exam  Constitutional: She is oriented to person, place, and time. She appears well-developed.  Cardiovascular: Normal rate, regular rhythm and normal heart sounds.   Pulmonary/Chest: Effort normal and breath sounds normal. No respiratory distress.  Neurological: She is alert and oriented to person, place, and time.  No deficits noted.  Finger-to-nose stable.  Strength 5/5 bilaterally in upper and lower extremities.  Hand grip strength 5/5  Nursing note and vitals reviewed.   Mallampati Score:  MD Evaluation Airway: WNL Heart: WNL Abdomen: WNL Chest/ Lungs: WNL ASA  Classification: 3 Mallampati/Airway Score: Two  Imaging: No results found.  Labs:  CBC:  Recent Labs  04/23/17 0707 05/05/17 1324 05/08/17 0525 08/01/17 1134  WBC 8.8 7.2 6.9 6.2  HGB 10.0* 10.7* 9.6* 12.1  HCT 31.6* 34.0* 30.2* 38.9  PLT 254 281 262 163    COAGS:  Recent Labs  03/26/17 1856 04/23/17 0707 05/05/17 1324 08/01/17 1134  INR 1.05 1.06 1.06 0.97  APTT 29 36 29 27    BMP:  Recent Labs  04/23/17 0707 05/05/17 1324 05/08/17 0525 08/01/17 1134  NA 137 138 138 139  K 3.8 3.9 3.7 4.0  CL 100* 104 104 106  CO2 26 24 22 25   GLUCOSE 94 83 100* 78  BUN 11 14 6 20   CALCIUM 9.4 9.7 8.9 9.4  CREATININE 0.77 0.77 0.54 0.83  GFRNONAA >60 >60 >60 >60  GFRAA >60 >60 >60 >60    LIVER FUNCTION TESTS:  Recent Labs  03/18/17 1044 03/26/17 1856 05/05/17 1324  BILITOT 0.3 0.4 0.6  AST 15 20 78*  ALT 12 10* 89*  ALKPHOS 96 81 400*  PROT 8.4 7.8 7.9  ALBUMIN 4.8 4.1 3.7    TUMOR MARKERS: No results for input(s): AFPTM, CEA, CA199, CHROMGRNA in the last 8760 hours.  Assessment and Plan: Occasional vertigo, s/p endovascular staged embolization of two right MCA artery aneurysms and stent  placement 05/07/17 Patient remain symptoms free, however presents for diagnostic angiogram with possible further intervention.  Her P2Y12 is 214 today.  Discussed with Dr. Estanislado Pandy.  Will plan to proceed with diagnostic angiogram today and schedule further intervention if appropriate.  Risks and benefits discussed with the patient including, but not limited to bleeding, infection, vascular injury or contrast induced renal failure. All of the patient's questions were answered, patient is agreeable to proceed. Consent signed and in chart.  Thank you for this interesting consult.  I greatly enjoyed meeting Amy Howell and look forward to participating in their care.  A copy of this report was sent to the requesting provider on this  date.  Electronically Signed: Docia Barrier, PA 08/06/2017, 9:09 AM   I spent a total of  15 Minutes in face to face in clinical consultation, greater than 50% of which was counseling/coordinating care for MCA aneurysm.

## 2017-08-06 NOTE — Procedures (Signed)
S/P 4 vessel cerebral arteriogram RT CFA approach. Findings. 1.RT MCA mid M1 aneurysm 53mm x 4.95mm. 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

## 2017-08-06 NOTE — Progress Notes (Signed)
Rx for Plavix 75mg  1 PO BID called into her pharmacy at [facility] and [facility].  She was given 1 refill.  Rutilio Yellowhair E 11:31 AM 08/06/2017

## 2017-08-06 NOTE — Transfer of Care (Signed)
Immediate Anesthesia Transfer of Care Note  Patient: Amy Howell  Procedure(s) Performed: Procedure(s): RADIOLOGY WITH ANESTHESIA STENTING (N/A)  Patient Location: Short Stay  Anesthesia Type:MAC  Level of Consciousness: awake, alert  and oriented  Airway & Oxygen Therapy: Patient Spontanous Breathing  Post-op Assessment: Report given to RN, Post -op Vital signs reviewed and stable and Patient moving all extremities  Post vital signs: Reviewed and stable  Last Vitals:  Vitals:   08/06/17 0713  BP: (!) 174/81  Pulse: 69  Resp: 18  Temp: 36.7 C  SpO2: 100%    Last Pain:  Vitals:   08/06/17 0713  TempSrc: Oral         Complications: No apparent anesthesia complications

## 2017-08-06 NOTE — Progress Notes (Signed)
CRNA present for case. Pt under the care of anesthesia

## 2017-08-06 NOTE — Consult Note (Deleted)
  The note originally documented on this encounter has been moved the the encounter in which it belongs.  

## 2017-08-06 NOTE — Discharge Instructions (Signed)
Cerebral Angiogram, Care After °Refer to this sheet in the next few weeks. These instructions provide you with information on caring for yourself after your procedure. Your health care provider may also give you more specific instructions. Your treatment has been planned according to current medical practices, but problems sometimes occur. Call your health care provider if you have any problems or questions after your procedure. °What can I expect after the procedure? °After your procedure, it is typical to have the following: °· Bruising at the catheter insertion site that usually fades within 1-2 weeks. °· Blood collecting in the tissue (hematoma) that may be painful to the touch. It should usually decrease in size and tenderness within 1-2 weeks. °· A mild headache. ° °Follow these instructions at home: °· Take medicines only as directed by your health care provider. °· You may shower 24-48 hours after the procedure or as directed by your health care provider. Remove the bandage (dressing) and gently wash the site with plain soap and water. Pat the area dry with a clean towel. Do not rub the site, because this may cause bleeding. °· Do not take baths, swim, or use a hot tub until your health care provider approves. °· Check your insertion site every day for redness, swelling, or drainage. °· Do not apply powder or lotion to the site. °· Do not lift over 10 lb (4.5 kg) for 5 days after your procedure or as directed by your health care provider. °· Ask your health care provider when it is okay to: °? Return to work or school. °? Resume usual physical activities or sports. °? Resume sexual activity. °· Do not drive home if you are discharged the same day as the procedure. Have someone else drive you. °· You may drive 24 hours after the procedure unless otherwise instructed by your health care provider. °· Do not operate machinery or power tools for 24 hours after the procedure or as directed by your health care  provider. °· If your procedure was done as an outpatient procedure, which means that you went home the same day as your procedure, a responsible adult should be with you for the first 24 hours after you arrive home. °· Keep all follow-up visits as directed by your health care provider. This is important. °Contact a health care provider if: °· You have a fever. °· You have chills. °· You have increased bleeding from the catheter insertion site. Hold pressure on the site. °Get help right away if: °· You have vision changes or loss of vision. °· You have numbness or weakness on one side of your body. °· You have difficulty talking, or you have slurred speech or cannot speak (aphasia). °· You feel confused or have difficulty remembering. °· You have unusual pain at the catheter insertion site. °· You have redness, warmth, or swelling at the catheter insertion site. °· You have drainage (other than a small amount of blood on the dressing) from the catheter insertion site. °· The catheter insertion site is bleeding, and the bleeding does not stop after 30 minutes of holding steady pressure on the site. °These symptoms may represent a serious problem that is an emergency. Do not wait to see if the symptoms will go away. Get medical help right away. Call your local emergency services (911 in [place] Do not drive yourself to the hospital. °This information is not intended to replace advice given to you by your health care provider. Make sure you discuss any questions   you have with your health care provider. °Document Released: 04/04/2014 Document Revised: 04/25/2016 Document Reviewed: 12/01/2013 °Elsevier Interactive Patient Education © 2017 Elsevier Inc. ° °

## 2017-08-07 ENCOUNTER — Encounter (HOSPITAL_COMMUNITY): Payer: Self-pay | Admitting: Interventional Radiology

## 2017-08-11 ENCOUNTER — Ambulatory Visit (HOSPITAL_COMMUNITY): Admission: RE | Admit: 2017-08-11 | Payer: Self-pay | Source: Ambulatory Visit

## 2017-08-12 MED FILL — ?CLOPIDOGREL 75MG TAB: 75 | 30 days supply | Qty: 30 | Fill #0

## 2017-08-14 MED FILL — LATANOPROST 0.005% EYE DRP: 0.005 | 25 days supply | Qty: 3 | Fill #2

## 2017-08-14 MED FILL — CLOPIDOGREL 75 MG TABLET: 75 | 30 days supply | Qty: 60 | Fill #0

## 2017-08-20 ENCOUNTER — Telehealth (HOSPITAL_COMMUNITY): Payer: Self-pay

## 2017-08-20 NOTE — Telephone Encounter (Signed)
Left message for pt's daughter to return call. AW

## 2017-08-25 ENCOUNTER — Other Ambulatory Visit (HOSPITAL_COMMUNITY)
Admission: AD | Admit: 2017-08-25 | Discharge: 2017-08-25 | Disposition: A | Discharge status: Home / Self Care | Payer: Self-pay | Source: Ambulatory Visit | Attending: Interventional Radiology | Admitting: Interventional Radiology

## 2017-08-25 ENCOUNTER — Other Ambulatory Visit: Payer: Self-pay | Admitting: General Surgery

## 2017-08-25 ENCOUNTER — Ambulatory Visit (INDEPENDENT_AMBULATORY_CARE_PROVIDER_SITE_OTHER): Payer: Self-pay | Admitting: *Deleted

## 2017-08-25 DIAGNOSIS — I639 Cerebral infarction, unspecified: Secondary | ICD-10-CM

## 2017-08-25 LAB — PLATELET INHIBITION P2Y12: PLATELET FUNCTION P2Y12: 140 [PRU] — AB (ref 194–418)

## 2017-08-26 LAB — CUP PACEART REMOTE DEVICE CHECK
Date Time Interrogation Session: 20180924213840
MDC IDC PG IMPLANT DT: 20180427

## 2017-08-26 NOTE — Progress Notes (Signed)
Carelink Summary Report / Loop Recorder 

## 2017-08-28 MED FILL — ?ATORVASTATIN 40MG TABLET: 40 | 30 days supply | Qty: 30 | Fill #1

## 2017-09-01 ENCOUNTER — Telehealth (HOSPITAL_COMMUNITY): Payer: Self-pay | Admitting: *Deleted

## 2017-09-08 MED FILL — LATANOPROST 0.005% EYE DRP: 0.005 | 25 days supply | Qty: 3 | Fill #3

## 2017-09-22 MED FILL — ?ATORVASTATIN 40MG TABLET: 40 | 30 days supply | Qty: 30 | Fill #2

## 2017-09-22 MED FILL — AMLODIPINE BESYLATE 10 MG T: 10 | 30 days supply | Qty: 30 | Fill #1

## 2017-09-24 ENCOUNTER — Other Ambulatory Visit: Payer: Self-pay | Admitting: Family Medicine

## 2017-09-24 ENCOUNTER — Ambulatory Visit (INDEPENDENT_AMBULATORY_CARE_PROVIDER_SITE_OTHER): Payer: Self-pay | Admitting: *Deleted

## 2017-09-24 DIAGNOSIS — I639 Cerebral infarction, unspecified: Secondary | ICD-10-CM

## 2017-09-24 MED FILL — TIMOLOL 0.5% EYE DROPS: 0.5 | 37 days supply | Qty: 5 | Fill #0

## 2017-09-24 MED FILL — DORZOLAMIDE HCL 2% EYE DRP: 2 | 75 days supply | Qty: 10 | Fill #0

## 2017-09-24 NOTE — Telephone Encounter (Signed)
Pt's daughter came into office requesting medication refill on clopidogrel (PLAVIX) 75 MG tablet, pt's surgery had been proposed due to her blood and they have not rescheduled surgery. Pt is unsure if she should still be taking mediation. Please f/up

## 2017-09-24 NOTE — Telephone Encounter (Signed)
Pt's daughter came into office requesting medication refill on clopidogrel (PLAVIX) 75 MG tablet, pt's surgery had been proposed due to her blood and they have not rescheduled surgery. Pt is unsure if she should still be taking medication, please advice?

## 2017-09-25 NOTE — Progress Notes (Signed)
Carelink Summary Report / Loop Recorder 

## 2017-09-29 MED FILL — ?CLOPIDOGREL 75MG TAB: 75 | 30 days supply | Qty: 30 | Fill #1

## 2017-09-30 ENCOUNTER — Ambulatory Visit: Payer: Self-pay | Attending: Family Medicine | Admitting: Family Medicine

## 2017-09-30 ENCOUNTER — Encounter: Payer: Self-pay | Admitting: Family Medicine

## 2017-09-30 ENCOUNTER — Telehealth: Payer: Self-pay | Admitting: General Surgery

## 2017-09-30 VITALS — BP 125/74 | HR 68 | Temp 98.6°F | Resp 18 | Ht 60.0 in | Wt 101.4 lb

## 2017-09-30 DIAGNOSIS — I6322 Cerebral infarction due to unspecified occlusion or stenosis of basilar arteries: Secondary | ICD-10-CM

## 2017-09-30 DIAGNOSIS — I10 Essential (primary) hypertension: Secondary | ICD-10-CM

## 2017-09-30 DIAGNOSIS — R7303 Prediabetes: Secondary | ICD-10-CM

## 2017-09-30 DIAGNOSIS — Z79899 Other long term (current) drug therapy: Secondary | ICD-10-CM | POA: Insufficient documentation

## 2017-09-30 DIAGNOSIS — Z7982 Long term (current) use of aspirin: Secondary | ICD-10-CM | POA: Insufficient documentation

## 2017-09-30 DIAGNOSIS — Z7902 Long term (current) use of antithrombotics/antiplatelets: Secondary | ICD-10-CM | POA: Insufficient documentation

## 2017-09-30 DIAGNOSIS — E782 Mixed hyperlipidemia: Secondary | ICD-10-CM

## 2017-09-30 DIAGNOSIS — Z8673 Personal history of transient ischemic attack (TIA), and cerebral infarction without residual deficits: Secondary | ICD-10-CM | POA: Insufficient documentation

## 2017-09-30 LAB — CUP PACEART REMOTE DEVICE CHECK
Date Time Interrogation Session: 20181024213640
Implantable Pulse Generator Implant Date: 20180427

## 2017-09-30 LAB — GLUCOSE, POCT (MANUAL RESULT ENTRY): POC Glucose: 88 mg/dl (ref 70–99)

## 2017-09-30 LAB — POCT GLYCOSYLATED HEMOGLOBIN (HGB A1C): Hemoglobin A1C: 6

## 2017-09-30 NOTE — Patient Instructions (Signed)
Prediabetes Prediabetes is the condition of having a blood sugar (blood glucose) level that is higher than it should be, but not high enough for you to be diagnosed with type 2 diabetes. Having prediabetes puts you at risk for developing type 2 diabetes (type 2 diabetes mellitus). Prediabetes may be called impaired glucose tolerance or impaired fasting glucose. Prediabetes usually does not cause symptoms. Your health care provider can diagnose this condition with blood tests. You may be tested for prediabetes if you are overweight and if you have at least one other risk factor for prediabetes. Risk factors for prediabetes include:  Having a family member with type 2 diabetes.  Being overweight or obese.  Being older than age 57.  Being of American-Indian, African-American, Hispanic/Latino, or Asian/Pacific Islander descent.  Having an inactive (sedentary) lifestyle.  Having a history of gestational diabetes or polycystic ovarian syndrome (PCOS).  Having low levels of good cholesterol (HDL-C) or high levels of blood fats (triglycerides).  Having high blood pressure.  What is blood glucose and how is blood glucose measured?  Blood glucose refers to the amount of glucose in your bloodstream. Glucose comes from eating foods that contain sugars and starches (carbohydrates) that the body breaks down into glucose. Your blood glucose level may be measured in mg/dL (milligrams per deciliter) or mmol/L (millimoles per liter).Your blood glucose may be checked with one or more of the following blood tests:  A fasting blood glucose (FBG) test. You will not be allowed to eat (you will fast) for at least 8 hours before a blood sample is taken. ? A normal range for FBG is 70-100 mg/dl (3.9-5.6 mmol/L).  An A1c (hemoglobin A1c) blood test. This test provides information about blood glucose control over the previous 2?68month.  An oral glucose tolerance test (OGTT). This test measures your blood  glucose twice: ? After fasting. This is your baseline level. ? Two hours after you drink a beverage that contains glucose.  You may be diagnosed with prediabetes:  If your FBG is 100?125 mg/dL (5.6-6.9 mmol/L).  If your A1c level is 5.7?6.4%.  If your OGGT result is 140?199 mg/dL (7.8-11 mmol/L).  These blood tests may be repeated to confirm your diagnosis. What happens if blood glucose is too high? The pancreas produces a hormone (insulin) that helps move glucose from the bloodstream into cells. When cells in the body do not respond properly to insulin that the body makes (insulin resistance), excess glucose builds up in the blood instead of going into cells. As a result, high blood glucose (hyperglycemia) can develop, which can cause many complications. This is a symptom of prediabetes. What can happen if blood glucose stays higher than normal for a long time? Having high blood glucose for a long time is dangerous. Too much glucose in your blood can damage your nerves and blood vessels. Long-term damage can lead to complications from diabetes, which may include:  Heart disease.  Stroke.  Blindness.  Kidney disease.  Depression.  Poor circulation in the feet and legs, which could lead to surgical removal (amputation) in severe cases.  How can prediabetes be prevented from turning into type 2 diabetes?  To help prevent type 2 diabetes, take the following actions:  Be physically active. ? Do moderate-intensity physical activity for at least 30 minutes on at least 5 days of the week, or as much as told by your health care provider. This could be brisk walking, biking, or water aerobics. ? Ask your health care provider what  activities are safe for you. A mix of physical activities may be best, such as walking, swimming, cycling, and strength training.  Lose weight as told by your health care provider. ? Losing 5-7% of your body weight can reverse insulin resistance. ? Your health  care provider can determine how much weight loss is best for you and can help you lose weight safely.  Follow a healthy meal plan. This includes eating lean proteins, complex carbohydrates, fresh fruits and vegetables, low-fat dairy products, and healthy fats. ? Follow instructions from your health care provider about eating or drinking restrictions. ? Make an appointment to see a diet and nutrition specialist (registered dietitian) to help you create a healthy eating plan that is right for you.  Do not smoke or use any tobacco products, such as cigarettes, chewing tobacco, and e-cigarettes. If you need help quitting, ask your health care provider.  Take over-the-counter and prescription medicines as told by your health care provider. You may be prescribed medicines that help lower the risk of type 2 diabetes.  This information is not intended to replace advice given to you by your health care provider. Make sure you discuss any questions you have with your health care provider. Document Released: 03/11/2016 Document Revised: 04/25/2016 Document Reviewed: 01/09/2016 Elsevier Interactive Patient Education  2018 Elsevier Inc.  

## 2017-09-30 NOTE — Progress Notes (Signed)
We have been contacted to call the [facility] this patient and her medications.  I have returned their call but no one answered.  I have left a voicemail for them to call us back.  Brigit Doke E 12:02 PM 09/30/2017

## 2017-09-30 NOTE — Progress Notes (Deleted)
Subjective:  Patient ID: Amy Howell, female    DOB: [birth year]  Age: 61 y.o. MRN: [id number]  CC: No chief complaint on file.   HPI Amy Howell presents for hypertension follow up. History of HTN, HLD, and CVA.  She is exercising and is adherent to low salt diet.  Blood pressure is well controlled at home. SBP at home range 92-110's and DBP 57-70's. Cardiac symptoms none. Patient denies chest pain, claudication, dyspnea, lower extremity edema, orthopnea, palpitations and syncope.  Cardiovascular risk factors: diabetes mellitus, dyslipidemia, hypertension and smoking/ tobacco exposure. Use of agents associated with hypertension: none. History of target organ damage: stroke. She is adherent with taking antihypertension, plavix, statin, and aspirin. She reports surgical intervention was rescheduled for September.   Said she was taking  asprin  81 mg    plavix 75 mg BID    Outpatient Medications Prior to Visit  Medication Sig Dispense Refill  . acetaminophen (TYLENOL) 500 MG tablet Take 1,000 mg by mouth daily as needed (for pain.).     Marland Kitchen amLODipine (NORVASC) 10 MG tablet Take 1 tablet (10 mg total) by mouth daily. 90 tablet 3  . aspirin 325 MG tablet Take 1 tablet (325 mg total) by mouth daily. 90 tablet 3  . atorvastatin (LIPITOR) 40 MG tablet Take 1 tablet (40 mg total) by mouth daily at 6 PM. 90 tablet 3  . clopidogrel (PLAVIX) 75 MG tablet Take 1 tablet (75 mg total) by mouth daily. 90 tablet 0  . feeding supplement, ENSURE ENLIVE, (ENSURE ENLIVE) LIQD Take 237 mLs by mouth 2 (two) times daily.     Marland Kitchen latanoprost (XALATAN) 0.005 % ophthalmic solution Place 1 drop into both eyes at bedtime.   11   No facility-administered medications prior to visit.     ROS Review of Systems  Constitutional: Negative.   Eyes: Negative.   Respiratory: Negative.   Cardiovascular: Negative.   Gastrointestinal: Negative.   Musculoskeletal: Negative.   Skin: Negative.   Neurological:  Negative.   Psychiatric/Behavioral: Negative for suicidal ideas.   Objective:  BP 125/74   Pulse 68   Temp 98.6 F (37 C) (Oral)   Resp 18   Ht 5' (1.524 m)   Wt 101 lb 6.4 oz (46 kg)   BMI 19.80 kg/m   BP/Weight 09/30/2017 2/0/2542 7/0/6237  Systolic BP 628 315 176  Diastolic BP 74 69 81  Wt. (Lbs) 101.4 - 99  BMI 19.8 - 19.02   Physical Exam  Constitutional: She is oriented to person, place, and time. She appears well-developed and well-nourished.  HENT:  Head: Normocephalic and atraumatic.  Right Ear: External ear normal.  Left Ear: External ear normal.  Nose: Nose normal.  Mouth/Throat: Oropharynx is clear and moist.  Eyes: Pupils are equal, round, and reactive to light. Conjunctivae are normal.  Neck: No JVD present.  Cardiovascular: Normal rate, regular rhythm, normal heart sounds and intact distal pulses.   Pulmonary/Chest: Effort normal and breath sounds normal.  Abdominal: Soft. Bowel sounds are normal.  Musculoskeletal: Normal range of motion.  Neurological: She is alert and oriented to person, place, and time. She has normal reflexes. Coordination normal.  Skin: Skin is warm and dry.  Psychiatric: She has a normal mood and affect.  Nursing note and vitals reviewed.    Assessment & Plan:   Problem List Items Addressed This Visit      Other   Prediabetes - Primary   Relevant Orders   HgB A1c (Completed)  Glucose (CBG) (Completed)      No orders of the defined types were placed in this encounter.   Follow-up: No Follow-up on file.   Alfonse Spruce FNP

## 2017-10-01 NOTE — Telephone Encounter (Signed)
CMA contact patient regarding to let her know about we still working on getting her medication information whether she still can take them   Patient did not answer but left a detailed message

## 2017-10-02 ENCOUNTER — Telehealth (HOSPITAL_COMMUNITY): Payer: Self-pay | Admitting: Radiology

## 2017-10-02 NOTE — Telephone Encounter (Signed)
Pt's daughter call and stated that her mother is now taking Plavix 75mg  1 daily instead of the Plavix 75mg  2 daily and has increased her Aspirin to 325mg  daily instead of the 81mg  daily, [facility] P[facility] at [facility] did not feel comfortable with her taking 2 Plavix daily w/o speaking to Dr. Estanislado Pandy. I informed the pt's daughter that Dr. Estanislado Pandy is expected back on Monday, October 06, 2017. I also requested that Ms. Hammes come in for a P2Y12 either tomorrow or Monday so that we can see what her numbers are at this time. The pt's daughter agrees with this plan. These results will be given to Dr. Estanislado Pandy upon his return. JM

## 2017-10-02 NOTE — Telephone Encounter (Signed)
Called pt's daughter, left VM for her to call to reschedule Amy Howell's stenosis treatment procedure with Dr. Estanislado Pandy. JM

## 2017-10-02 NOTE — Telephone Encounter (Signed)
Returned a call to the [facility] [employer] [facility]) about pt's medications. No answer and no VM was available when I called. Called pt's daughter to discuss rescheduling pt's procedure, left her a VM to call me back. JM

## 2017-10-03 NOTE — Progress Notes (Signed)
Subjective:  Patient ID: Amy Howell, female    DOB: February 06, 1956  Age: 61 y.o. MRN: [id number]  CC: Hypertension   HPI Amy Howell presents for hypertension follow up, she is accompanied by her daughter. History of HTN, HLD, and CVA.  She is exercising and is adherent to low salt diet. She reports she has stopped checking BP at home.  Cardiac symptoms none. Patient denies chest pain, claudication, dyspnea, lower extremity edema, orthopnea, palpitations and syncope.  Cardiovascular risk factors: diabetes mellitus, dyslipidemia, hypertension and smoking/ tobacco exposure. Use of agents associated with hypertension: none. History of target organ damage: stroke. She is adherent with taking antihypertension, plavix, statin, and aspirin. She reports surgical intervention was rescheduled for September but was rescheduled due to anticoagulation concern. She reports being advised to take plavix 75 BID and aspirin 81 mg in place of the plavix 75 mg QD and aspirin 325 that was she was previously taking from IR provider. She reports she is now taking plavix 75 mg QD and aspirin 325 due to needing medication refills.   Outpatient Medications Prior to Visit  Medication Sig Dispense Refill  . acetaminophen (TYLENOL) 500 MG tablet Take 1,000 mg by mouth daily as needed (for pain.).     Marland Kitchen amLODipine (NORVASC) 10 MG tablet Take 1 tablet (10 mg total) by mouth daily. 90 tablet 3  . aspirin 325 MG tablet Take 1 tablet (325 mg total) by mouth daily. 90 tablet 3  . atorvastatin (LIPITOR) 40 MG tablet Take 1 tablet (40 mg total) by mouth daily at 6 PM. 90 tablet 3  . clopidogrel (PLAVIX) 75 MG tablet Take 1 tablet (75 mg total) by mouth daily. 90 tablet 0  . feeding supplement, ENSURE ENLIVE, (ENSURE ENLIVE) LIQD Take 237 mLs by mouth 2 (two) times daily.     Marland Kitchen latanoprost (XALATAN) 0.005 % ophthalmic solution Place 1 drop into both eyes at bedtime.   11   No facility-administered medications prior to visit.      ROS Review of Systems  Constitutional: Negative.   Eyes: Negative.   Respiratory: Negative.   Cardiovascular: Negative.   Gastrointestinal: Negative.   Musculoskeletal: Negative.   Skin: Negative.   Neurological: Negative.   Psychiatric/Behavioral: Negative for suicidal ideas.   Objective:  BP 125/74   Pulse 68   Temp 98.6 F (37 C) (Oral)   Resp 18   Ht 5' (1.524 m)   Wt 101 lb 6.4 oz (46 kg)   BMI 19.80 kg/m   BP/Weight 09/30/2017 07/10/2118 03/03/7407  Systolic BP 144 818 563  Diastolic BP 74 69 81  Wt. (Lbs) 101.4 - 99  BMI 19.8 - 19.02   Physical Exam  Constitutional: She is oriented to person, place, and time. She appears well-developed and well-nourished.  HENT:  Head: Normocephalic and atraumatic.  Right Ear: External ear normal.  Left Ear: External ear normal.  Nose: Nose normal.  Mouth/Throat: Oropharynx is clear and moist.  Eyes: Pupils are equal, round, and reactive to light. Conjunctivae are normal.  Neck: No JVD present.  Cardiovascular: Normal rate, regular rhythm, normal heart sounds and intact distal pulses.   Pulmonary/Chest: Effort normal and breath sounds normal.  Abdominal: Soft. Bowel sounds are normal.  Musculoskeletal: Normal range of motion.  Neurological: She is alert and oriented to person, place, and time. She has normal reflexes. Coordination normal.  Skin: Skin is warm and dry.  Psychiatric: She has a normal mood and affect.  Nursing note and vitals reviewed.  Assessment & Plan:   1. Prediabetes -Recommend starting metformin  as well more attention to diet, pt.declined she would like to incorporate more dietary changes at recheck in another 3 months.  - HgB A1c - Glucose (CBG)  2. Essential hypertension BP controlled on current medication. Cont. amlodipine  3. 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)  -Current medication list does not reflect changes to DAPT. Will  contact IR office to confirm  medication dosage change.   4. Mixed hyperlipidemia  Cont. plavix     Follow-up: Return in about 3 months (around 12/31/2017) for HTN.   Alfonse Spruce FNP

## 2017-10-06 ENCOUNTER — Telehealth (HOSPITAL_COMMUNITY): Payer: Self-pay | Admitting: *Deleted

## 2017-10-06 ENCOUNTER — Other Ambulatory Visit (HOSPITAL_COMMUNITY): Payer: Self-pay | Admitting: Radiology

## 2017-10-06 DIAGNOSIS — I671 Cerebral aneurysm, nonruptured: Secondary | ICD-10-CM

## 2017-10-06 LAB — PLATELET INHIBITION P2Y12: Platelet Function  P2Y12: 158 [PRU] — ABNORMAL LOW (ref 194–418)

## 2017-10-06 NOTE — Telephone Encounter (Signed)
Called and left voice mail.  Left call back number.  Per Dr. Estanislado Pandy patients medication needs to be changed.  Will attempt recall tomorrow.

## 2017-10-13 ENCOUNTER — Telehealth (HOSPITAL_COMMUNITY): Payer: Self-pay | Admitting: *Deleted

## 2017-10-13 NOTE — Telephone Encounter (Signed)
Called and informed daughter, per Dr. Estanislado Pandy pt to take Brilinta 90mg  in am and 45mg  in pm.  I let her know [facility]  Once pt cahnges med and dose needs to come in and have p2Y12 rechecked in 5-7 days

## 2017-10-13 NOTE — Telephone Encounter (Signed)
Per Dr. Estanislado Pandy called in [place] 90mg  in am and 45mg  in pm  Qty 52.

## 2017-10-20 MED FILL — BRILINTA 90 MG TABLET: 90 | 40 days supply | Qty: 60 | Fill #0

## 2017-10-27 ENCOUNTER — Ambulatory Visit (INDEPENDENT_AMBULATORY_CARE_PROVIDER_SITE_OTHER): Payer: Self-pay | Admitting: *Deleted

## 2017-10-27 DIAGNOSIS — I639 Cerebral infarction, unspecified: Secondary | ICD-10-CM

## 2017-10-27 MED FILL — AMLODIPINE BESYLATE 10 MG T: 10 | 30 days supply | Qty: 30 | Fill #2

## 2017-10-27 MED FILL — LATANOPROST 0.005% EYE DRP: 0.005 | 25 days supply | Qty: 3 | Fill #4

## 2017-10-27 MED FILL — ?ATORVASTATIN 40MG TABLET: 40 | 30 days supply | Qty: 30 | Fill #3

## 2017-10-27 MED FILL — TIMOLOL 0.5% EYE DROPS: 0.5 | 37 days supply | Qty: 5 | Fill #1

## 2017-10-27 NOTE — Progress Notes (Signed)
Carelink Summary Report / Loop Recorder 

## 2017-11-05 ENCOUNTER — Telehealth (HOSPITAL_COMMUNITY): Payer: Self-pay | Admitting: Radiology

## 2017-11-05 NOTE — Telephone Encounter (Signed)
Called pt's daughter, told her that Amy Howell needs to come in this week for a repeat P2Y12 blood draw. She said they would come in this week for the test. JM

## 2017-11-07 ENCOUNTER — Other Ambulatory Visit (HOSPITAL_COMMUNITY)
Admission: RE | Admit: 2017-11-07 | Discharge: 2017-11-07 | Disposition: A | Discharge status: Home / Self Care | Payer: Self-pay | Source: Ambulatory Visit | Attending: Interventional Radiology | Admitting: Interventional Radiology

## 2017-11-07 ENCOUNTER — Other Ambulatory Visit (HOSPITAL_COMMUNITY): Payer: Self-pay | Admitting: Radiology

## 2017-11-07 DIAGNOSIS — I771 Stricture of artery: Secondary | ICD-10-CM

## 2017-11-07 LAB — PLATELET INHIBITION P2Y12: Platelet Function  P2Y12: 18 [PRU] — ABNORMAL LOW (ref 194–418)

## 2017-11-11 LAB — CUP PACEART REMOTE DEVICE CHECK
Date Time Interrogation Session: 20181123223931
MDC IDC PG IMPLANT DT: 20180427

## 2017-11-14 ENCOUNTER — Ambulatory Visit: Payer: Self-pay | Attending: Family Medicine

## 2017-11-19 ENCOUNTER — Telehealth (HOSPITAL_COMMUNITY): Payer: Self-pay | Admitting: *Deleted

## 2017-11-19 NOTE — Telephone Encounter (Signed)
Called an left message pt's daughter [name], Per Dr. Estanislado Pandy patient is to take Brilinta 45mg  twice a day and continute ASA 325mg  daily.  Left call back number for questions and concerns.

## 2017-11-24 ENCOUNTER — Ambulatory Visit (INDEPENDENT_AMBULATORY_CARE_PROVIDER_SITE_OTHER): Payer: Self-pay | Admitting: *Deleted

## 2017-11-24 DIAGNOSIS — I639 Cerebral infarction, unspecified: Secondary | ICD-10-CM

## 2017-11-26 MED FILL — AMLODIPINE BESYLATE 10 MG T: 10 | 30 days supply | Qty: 30 | Fill #3

## 2017-11-26 MED FILL — DORZOLAMIDE HCL 2% EYE DRP: 2 | 75 days supply | Qty: 10 | Fill #1

## 2017-11-26 MED FILL — ?ATORVASTATIN 40MG TABLET: 40 | 30 days supply | Qty: 30 | Fill #4

## 2017-11-26 MED FILL — TIMOLOL 0.5% EYE DROPS: 0.5 | 37 days supply | Qty: 5 | Fill #2

## 2017-11-26 MED FILL — LATANOPROST 0.005% EYE DRP: 0.005 | 25 days supply | Qty: 3 | Fill #5

## 2017-11-26 NOTE — Progress Notes (Signed)
Carelink Summary Report / Loop Recorder 

## 2017-12-05 LAB — CUP PACEART REMOTE DEVICE CHECK
Implantable Pulse Generator Implant Date: 20180427
MDC IDC SESS DTM: 20181223223733

## 2017-12-08 DIAGNOSIS — I10 Essential (primary) hypertension: Secondary | ICD-10-CM

## 2017-12-08 MED FILL — BRILINTA 90 MG TABLET: 90 | 40 days supply | Qty: 60 | Fill #0

## 2017-12-22 ENCOUNTER — Other Ambulatory Visit (HOSPITAL_COMMUNITY): Payer: Self-pay | Admitting: Radiology

## 2017-12-22 DIAGNOSIS — I6320 Cerebral infarction due to unspecified occlusion or stenosis of unspecified precerebral arteries: Secondary | ICD-10-CM

## 2017-12-22 LAB — PLATELET INHIBITION P2Y12: PLATELET FUNCTION P2Y12: 5 [PRU] — AB (ref 194–418)

## 2017-12-23 ENCOUNTER — Ambulatory Visit (INDEPENDENT_AMBULATORY_CARE_PROVIDER_SITE_OTHER): Payer: Self-pay | Admitting: *Deleted

## 2017-12-23 DIAGNOSIS — I639 Cerebral infarction, unspecified: Secondary | ICD-10-CM

## 2017-12-24 MED FILL — AMLODIPINE BESYLATE 10 MG T: 10 | 30 days supply | Qty: 30 | Fill #4

## 2017-12-24 MED FILL — ?ATORVASTATIN 40MG TABLET: 40 | 30 days supply | Qty: 30 | Fill #5

## 2017-12-24 NOTE — Progress Notes (Signed)
Carelink Summary Report / Loop Recorder 

## 2017-12-29 ENCOUNTER — Encounter: Payer: Self-pay | Admitting: Family Medicine

## 2017-12-29 ENCOUNTER — Ambulatory Visit: Payer: Self-pay | Attending: Family Medicine | Admitting: Family Medicine

## 2017-12-29 ENCOUNTER — Other Ambulatory Visit: Payer: Self-pay

## 2017-12-29 VITALS — BP 116/73 | HR 72 | Temp 97.6°F | Resp 12 | Ht 60.0 in | Wt 106.6 lb

## 2017-12-29 DIAGNOSIS — I10 Essential (primary) hypertension: Secondary | ICD-10-CM

## 2017-12-29 DIAGNOSIS — Z7902 Long term (current) use of antithrombotics/antiplatelets: Secondary | ICD-10-CM | POA: Insufficient documentation

## 2017-12-29 DIAGNOSIS — Z7982 Long term (current) use of aspirin: Secondary | ICD-10-CM | POA: Insufficient documentation

## 2017-12-29 DIAGNOSIS — Z8673 Personal history of transient ischemic attack (TIA), and cerebral infarction without residual deficits: Secondary | ICD-10-CM | POA: Insufficient documentation

## 2017-12-29 DIAGNOSIS — Z13220 Encounter for screening for lipoid disorders: Secondary | ICD-10-CM

## 2017-12-29 DIAGNOSIS — E782 Mixed hyperlipidemia: Secondary | ICD-10-CM

## 2017-12-29 DIAGNOSIS — Z79899 Other long term (current) drug therapy: Secondary | ICD-10-CM | POA: Insufficient documentation

## 2017-12-29 DIAGNOSIS — I6322 Cerebral infarction due to unspecified occlusion or stenosis of basilar arteries: Secondary | ICD-10-CM

## 2017-12-29 MED ORDER — CLOPIDOGREL BISULFATE 75 MG PO TABS: 75.0000 mg | ORAL_TABLET | Freq: Every day | ORAL | 3 refills | Status: DC

## 2017-12-29 NOTE — Progress Notes (Signed)
3 month f/u

## 2017-12-29 NOTE — Patient Instructions (Signed)
Managing Your Hypertension Hypertension is commonly called high blood pressure. This is when the force of your blood pressing against the walls of your arteries is too strong. Arteries are blood vessels that carry blood from your heart throughout your body. Hypertension forces the heart to work harder to pump blood, and may cause the arteries to become narrow or stiff. Having untreated or uncontrolled hypertension can cause heart attack, stroke, kidney disease, and other problems. What are blood pressure readings? A blood pressure reading consists of a higher number over a lower number. Ideally, your blood pressure should be below 120/80. The first ("top") number is called the systolic pressure. It is a measure of the pressure in your arteries as your heart beats. The second ("bottom") number is called the diastolic pressure. It is a measure of the pressure in your arteries as the heart relaxes. What does my blood pressure reading mean? Blood pressure is classified into four stages. Based on your blood pressure reading, your health care provider may use the following stages to determine what type of treatment you need, if any. Systolic pressure and diastolic pressure are measured in a unit called mm Hg. Normal  Systolic pressure: below 120.  Diastolic pressure: below 80. Elevated  Systolic pressure: 120-129.  Diastolic pressure: below 80. Hypertension stage 1  Systolic pressure: 130-139.  Diastolic pressure: 80-89. Hypertension stage 2  Systolic pressure: 140 or above.  Diastolic pressure: 90 or above. What health risks are associated with hypertension? Managing your hypertension is an important responsibility. Uncontrolled hypertension can lead to:  A heart attack.  A stroke.  A weakened blood vessel (aneurysm).  Heart failure.  Kidney damage.  Eye damage.  Metabolic syndrome.  Memory and concentration problems.  What changes can I make to manage my  hypertension? Hypertension can be managed by making lifestyle changes and possibly by taking medicines. Your health care provider will help you make a plan to bring your blood pressure within a normal range. Eating and drinking  Eat a diet that is high in fiber and potassium, and low in salt (sodium), added sugar, and fat. An example eating plan is called the DASH (Dietary Approaches to Stop Hypertension) diet. To eat this way: ? Eat plenty of fresh fruits and vegetables. Try to fill half of your plate at each meal with fruits and vegetables. ? Eat whole grains, such as whole wheat pasta, brown rice, or whole grain bread. Fill about one quarter of your plate with whole grains. ? Eat low-fat diary products. ? Avoid fatty cuts of meat, processed or cured meats, and poultry with skin. Fill about one quarter of your plate with lean proteins such as fish, chicken without skin, beans, eggs, and tofu. ? Avoid premade and processed foods. These tend to be higher in sodium, added sugar, and fat.  Reduce your daily sodium intake. Most people with hypertension should eat less than 1,500 mg of sodium a day.  Limit alcohol intake to no more than 1 drink a day for nonpregnant women and 2 drinks a day for men. One drink equals 12 oz of beer, 5 oz of wine, or 1 oz of hard liquor. Lifestyle  Work with your health care provider to maintain a healthy body weight, or to lose weight. Ask what an ideal weight is for you.  Get at least 30 minutes of exercise that causes your heart to beat faster (aerobic exercise) most days of the week. Activities may include walking, swimming, or biking.  Include exercise   to strengthen your muscles (resistance exercise), such as weight lifting, as part of your weekly exercise routine. Try to do these types of exercises for 30 minutes at least 3 days a week.  Do not use any products that contain nicotine or tobacco, such as cigarettes and e-cigarettes. If you need help quitting, ask  your health care provider.  Control any long-term (chronic) conditions you have, such as high cholesterol or diabetes. Monitoring  Monitor your blood pressure at home as told by your health care provider. Your personal target blood pressure may vary depending on your medical conditions, your age, and other factors.  Have your blood pressure checked regularly, as often as told by your health care provider. Working with your health care provider  Review all the medicines you take with your health care provider because there may be side effects or interactions.  Talk with your health care provider about your diet, exercise habits, and other lifestyle factors that may be contributing to hypertension.  Visit your health care provider regularly. Your health care provider can help you create and adjust your plan for managing hypertension. Will I need medicine to control my blood pressure? Your health care provider may prescribe medicine if lifestyle changes are not enough to get your blood pressure under control, and if:  Your systolic blood pressure is 130 or higher.  Your diastolic blood pressure is 80 or higher.  Take medicines only as told by your health care provider. Follow the directions carefully. Blood pressure medicines must be taken as prescribed. The medicine does not work as well when you skip doses. Skipping doses also puts you at risk for problems. Contact a health care provider if:  You think you are having a reaction to medicines you have taken.  You have repeated (recurrent) headaches.  You feel dizzy.  You have swelling in your ankles.  You have trouble with your vision. Get help right away if:  You develop a severe headache or confusion.  You have unusual weakness or numbness, or you feel faint.  You have severe pain in your chest or abdomen.  You vomit repeatedly.  You have trouble breathing. Summary  Hypertension is when the force of blood pumping through  your arteries is too strong. If this condition is not controlled, it may put you at risk for serious complications.  Your personal target blood pressure may vary depending on your medical conditions, your age, and other factors. For most people, a normal blood pressure is less than 120/80.  Hypertension is managed by lifestyle changes, medicines, or both. Lifestyle changes include weight loss, eating a healthy, low-sodium diet, exercising more, and limiting alcohol. This information is not intended to replace advice given to you by your health care provider. Make sure you discuss any questions you have with your health care provider. Document Released: 08/12/2012 Document Revised: 10/16/2016 Document Reviewed: 10/16/2016 Elsevier Interactive Patient Education  2018 [employer] Inc[employer]  

## 2017-12-29 NOTE — Progress Notes (Signed)
   Subjective:  Patient ID: Amy Howell, female    DOB: 09/13/1956  Age: 62 y.o. MRN: [id number]  CC: Follow-up   HPI Scottlynn Lindell presents for    Hypertension  Disease Monitoring  Blood pressure range: No BP checking at home.   Chest pain: no   Dyspnea: no   Claudication: no   Medication compliance: yes  Medication Side Effects  Lightheadedness: no   Urinary frequency: no   Edema: no   Preventitive Healthcare:  Exercise: no   Diet Pattern: low sodium  Salt Restriction: no     Outpatient Medications Prior to Visit  Medication Sig Dispense Refill  . amLODipine (NORVASC) 10 MG tablet Take 1 tablet (10 mg total) by mouth daily. 90 tablet 3  . aspirin 325 MG tablet Take 1 tablet (325 mg total) by mouth daily. 90 tablet 3  . atorvastatin (LIPITOR) 40 MG tablet Take 1 tablet (40 mg total) by mouth daily at 6 PM. 90 tablet 3  . feeding supplement, ENSURE ENLIVE, (ENSURE ENLIVE) LIQD Take 237 mLs by mouth 2 (two) times daily.     Marland Kitchen latanoprost (XALATAN) 0.005 % ophthalmic solution Place 1 drop into both eyes at bedtime.   11  . clopidogrel (PLAVIX) 75 MG tablet Take 1 tablet (75 mg total) by mouth daily. 90 tablet 0  . acetaminophen (TYLENOL) 500 MG tablet Take 1,000 mg by mouth daily as needed (for pain.).      No facility-administered medications prior to visit.     ROS Review of Systems  Constitutional: Negative.   Eyes: Negative for visual disturbance.  Respiratory: Negative.   Cardiovascular: Negative.   Gastrointestinal: Negative.   Skin: Negative.     Objective:  BP 116/73 (BP Location: Right Arm, Cuff Size: Normal)   Pulse 72   Temp 97.6 F (36.4 C) (Oral)   Resp 12   Ht 5' (1.524 m)   Wt 106 lb 9.6 oz (48.4 kg)   SpO2 96%   BMI 20.82 kg/m   BP/Weight 12/29/2017 31/51/7616 0/06/3709  Systolic BP 626 948 546  Diastolic BP 73 74 69  Wt. (Lbs) 106.6 101.4 -  BMI 20.82 19.8 -     Physical Exam  Constitutional: She appears well-developed and  well-nourished.  Eyes: Conjunctivae are normal.  Neck: No JVD present.  Cardiovascular: Normal rate, regular rhythm, normal heart sounds and intact distal pulses.  Pulmonary/Chest: Effort normal and breath sounds normal.  Abdominal: Soft. Bowel sounds are normal. There is no tenderness.  Skin: Skin is warm and dry.  Psychiatric: She has a normal mood and affect.  Nursing note and vitals reviewed.    Assessment & Plan:   1. Essential hypertension Controlled on current medications  2. Mixed hyperlipidemia - Lipid Panel  3. Screening cholesterol level  - Lipid Panel - Hepatic Function Panel  4. 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  Dispense: 90 tablet; Refill: 3      Follow-up: Return in about 3 months (around 03/29/2018) for HTN/HLD.   Alfonse Spruce FNP

## 2017-12-30 LAB — HEPATIC FUNCTION PANEL
ALBUMIN: 4.4 g/dL (ref 3.6–4.8)
ALT: 18 IU/L (ref 0–32)
AST: 17 IU/L (ref 0–40)
Alkaline Phosphatase: 115 IU/L (ref 39–117)
Bilirubin Total: 0.2 mg/dL (ref 0.0–1.2)
Bilirubin, Direct: 0.07 mg/dL (ref 0.00–0.40)
TOTAL PROTEIN: 7.7 g/dL (ref 6.0–8.5)

## 2017-12-30 LAB — LIPID PANEL
CHOLESTEROL TOTAL: 122 mg/dL (ref 100–199)
Chol/HDL Ratio: 2.7 ratio (ref 0.0–4.4)
HDL: 46 mg/dL (ref >39)
LDL CALC: 66 mg/dL (ref 0–99)
Triglycerides: 52 mg/dL (ref 0–149)
VLDL CHOLESTEROL CAL: 10 mg/dL (ref 5–40)

## 2018-01-02 ENCOUNTER — Telehealth: Payer: Self-pay

## 2018-01-02 LAB — CUP PACEART REMOTE DEVICE CHECK
Implantable Pulse Generator Implant Date: 20180427
MDC IDC SESS DTM: 20190122230726

## 2018-01-02 NOTE — Telephone Encounter (Signed)
Patient was called and informed of lab results. 

## 2018-01-14 ENCOUNTER — Ambulatory Visit (INDEPENDENT_AMBULATORY_CARE_PROVIDER_SITE_OTHER): Payer: Self-pay | Admitting: Internal Medicine

## 2018-01-14 ENCOUNTER — Encounter: Payer: Self-pay | Admitting: Internal Medicine

## 2018-01-14 VITALS — BP 142/62 | HR 80 | Ht 60.0 in | Wt 107.0 lb

## 2018-01-14 DIAGNOSIS — I639 Cerebral infarction, unspecified: Secondary | ICD-10-CM

## 2018-01-14 LAB — CUP PACEART INCLINIC DEVICE CHECK
Date Time Interrogation Session: 20190213160620
MDC IDC PG IMPLANT DT: 20180427

## 2018-01-14 NOTE — Patient Instructions (Signed)
Medication Instructions:  Your physician recommends that you continue on your current medications as directed. Please refer to the Current Medication list given to you today.  * If you need a refill on your cardiac medications before your next appointment, please call your pharmacy. *  Labwork: None ordered  Testing/Procedures: None ordered  Follow-Up: Continue with monthly Care[facility] monitoring.  No follow up is needed at this time with Dr. Rayann Heman.  He will see you on an as needed basis.  Thank you for choosing [facility]!!

## 2018-01-14 NOTE — Progress Notes (Signed)
PCP: Alfonse Spruce, FNP   Primary EP: Dr Blanchie Serve is a 62 y.o. female who presents today for routine electrophysiology followup.  She is s/p ILR for cryptogenic stroke 4/18. She has been monitored without afib detected thus far.  She is concerned that her ILR may have moved.  Today, she denies symptoms of palpitations, chest pain, shortness of breath,  lower extremity edema, dizziness, presyncope, or syncope.  The patient is otherwise without complaint today.   Past Medical History:  Diagnosis Date  . Cataracts, bilateral   . 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 Glaucoma   . Headache   . Hypertension   . Stroke [facility])    no residual, "series of mini strokes"  . Vertigo    Past Surgical History:  Procedure Laterality Date  . ABDOMINAL HYSTERECTOMY    . CARDIAC DEFIBRILLATOR PLACEMENT    . gum operation    . IR 3D INDEPENDENT WKST  05/07/2017  . IR ANGIO INTRA EXTRACRAN SEL COM CAROTID INNOMINATE BILAT MOD SED  04/23/2017  . IR ANGIO INTRA EXTRACRAN SEL COM CAROTID INNOMINATE BILAT MOD SED  08/06/2017  . IR ANGIO INTRA EXTRACRAN SEL COM CAROTID INNOMINATE UNI R MOD SED  05/07/2017  . IR ANGIO VERTEBRAL SEL VERTEBRAL BILAT MOD SED  04/23/2017  . IR ANGIO VERTEBRAL SEL VERTEBRAL BILAT MOD SED  08/06/2017  . IR ANGIOGRAM FOLLOW UP STUDY  05/07/2017  . IR ANGIOGRAM SELECTIVE EACH ADDITIONAL VESSEL  05/07/2017  . IR RADIOLOGIST EVAL & MGMT  04/16/2017  . IR RADIOLOGIST EVAL & MGMT  05/29/2017  . IR TRANSCATH/EMBOLIZ  05/07/2017  . LOOP RECORDER INSERTION N/A 03/28/2017   Procedure: Loop Recorder Insertion;  Surgeon: Thompson Grayer, MD;  Location: [place] [facility];  Service: Cardiovascular;  Laterality: N/A;  . RADIOLOGY WITH ANESTHESIA N/A 05/07/2017   Procedure: EMBOLIZATION;  Surgeon: Luanne Bras, MD;  Location: [place];  Service: Radiology;  Laterality: N/A;  . RADIOLOGY WITH ANESTHESIA N/A 08/06/2017   Procedure: RADIOLOGY WITH ANESTHESIA STENTING;   Surgeon: Luanne Bras, MD;  Location: [place];  Service: Radiology;  Laterality: N/A;  . TEE WITHOUT CARDIOVERSION N/A 03/28/2017   Procedure: TRANSESOPHAGEAL ECHOCARDIOGRAM (TEE);  Surgeon: Lelon Perla, MD;  Location: [facility] END[facility];  Service: Cardiovascular;  Laterality: N/A;    ROS- all systems are reviewed and negatives except as per HPI above  Current Outpatient Medications  Medication Sig Dispense Refill  . acetaminophen (TYLENOL) 500 MG tablet Take 1,000 mg by mouth daily as needed (for pain.).     Marland Kitchen amLODipine (NORVASC) 10 MG tablet Take 1 tablet (10 mg total) by mouth daily. 90 tablet 3  . aspirin 325 MG tablet Take 1 tablet (325 mg total) by mouth daily. 90 tablet 3  . atorvastatin (LIPITOR) 40 MG tablet Take 1 tablet (40 mg total) by mouth daily at 6 PM. 90 tablet 3  . clopidogrel (PLAVIX) 75 MG tablet Take 1 tablet (75 mg total) by mouth daily. 90 tablet 3  . feeding supplement, ENSURE ENLIVE, (ENSURE ENLIVE) LIQD Take 237 mLs by mouth 2 (two) times daily.     Marland Kitchen latanoprost (XALATAN) 0.005 % ophthalmic solution Place 1 drop into both eyes at bedtime.   11   No current facility-administered medications for this visit.     Physical Exam: Vitals:   01/14/18 1421  BP: (!) 142/62  Pulse: 80  Weight: 107 lb (48.5 kg)  Height: 5' (1.524 m)  GEN- The patient is well appearing, alert and oriented x 3 today.   Head- normocephalic, atraumatic Eyes-  Sclera clear, conjunctiva pink Ears- hearing intact Oropharynx- clear Lungs- Clear to ausculation bilaterally, normal work of breathing Heart- Regular rate and rhythm, no murmurs, rubs or gallops, PMI not laterally displaced GI- soft, NT, ND, + BS Extremities- no clubbing, cyanosis, or edema Chest wall- ILR site is well healed.  Device is in normal position.  Not tender  ILR interrogation today is reviewed and reveals no afib  Assessment and Plan:  1. 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  After long discussion, she would like to keep her device in place at this time.  We will therefore continue to monitor remotely.  I will see as needed  Thompson Grayer MD, [facility] 01/14/2018 2:31 PM

## 2018-01-19 MED FILL — ?ATORVASTATIN 40MG TABLET: 40 | 30 days supply | Qty: 30 | Fill #6

## 2018-01-21 MED FILL — !BRILINTA 90 MG TABLET: 40 days supply | Qty: 60 | Fill #1

## 2018-01-26 ENCOUNTER — Ambulatory Visit (INDEPENDENT_AMBULATORY_CARE_PROVIDER_SITE_OTHER): Payer: Self-pay | Admitting: *Deleted

## 2018-01-26 DIAGNOSIS — I639 Cerebral infarction, unspecified: Secondary | ICD-10-CM

## 2018-01-26 NOTE — Progress Notes (Signed)
Carelink Summary Report / Loop Recorder 

## 2018-01-27 MED FILL — AMLODIPINE BESYLATE 10 MG T: 10 | 30 days supply | Qty: 30 | Fill #5

## 2018-02-23 MED FILL — ?ATORVASTATIN 40MG TABLET: 40 | 30 days supply | Qty: 30 | Fill #7

## 2018-02-23 MED FILL — AMLODIPINE BESYLATE 10 MG T: 10 | 30 days supply | Qty: 30 | Fill #6

## 2018-02-26 MED FILL — $BRILINTA 90 MG TABLET: 90 | 40 days supply | Qty: 60 | Fill #2

## 2018-02-27 ENCOUNTER — Ambulatory Visit (INDEPENDENT_AMBULATORY_CARE_PROVIDER_SITE_OTHER): Payer: Self-pay | Admitting: *Deleted

## 2018-02-27 DIAGNOSIS — I639 Cerebral infarction, unspecified: Secondary | ICD-10-CM

## 2018-03-02 NOTE — Progress Notes (Signed)
Carelink Summary Report / Loop Recorder 

## 2018-03-03 LAB — CUP PACEART REMOTE DEVICE CHECK
Implantable Pulse Generator Implant Date: 20180427
MDC IDC SESS DTM: 20190224233717

## 2018-03-04 ENCOUNTER — Telehealth (HOSPITAL_COMMUNITY): Payer: Self-pay

## 2018-03-04 NOTE — Telephone Encounter (Signed)
Called to schedule f/u mri/mra, no answer, left vm. AW  

## 2018-03-12 ENCOUNTER — Telehealth (HOSPITAL_COMMUNITY): Payer: Self-pay

## 2018-03-12 NOTE — Telephone Encounter (Signed)
Called to schedule mri, left message for daughter to return call. AW

## 2018-03-16 ENCOUNTER — Other Ambulatory Visit (HOSPITAL_COMMUNITY): Payer: Self-pay | Admitting: Interventional Radiology

## 2018-03-16 DIAGNOSIS — I651 Occlusion and stenosis of basilar artery: Secondary | ICD-10-CM

## 2018-03-24 MED FILL — ?ATORVASTATIN 40MG TABLET: 40 | 30 days supply | Qty: 30 | Fill #8

## 2018-03-24 MED FILL — AMLODIPINE BESYLATE 10 MG T: 10 | 30 days supply | Qty: 30 | Fill #7

## 2018-03-30 ENCOUNTER — Ambulatory Visit: Payer: Self-pay | Attending: Internal Medicine | Admitting: Internal Medicine

## 2018-03-30 ENCOUNTER — Ambulatory Visit: Payer: Self-pay | Admitting: Nurse Practitioner

## 2018-03-30 ENCOUNTER — Encounter: Payer: Self-pay | Admitting: Internal Medicine

## 2018-03-30 VITALS — BP 158/75 | HR 65 | Temp 98.1°F | Resp 16 | Ht 60.5 in | Wt 111.6 lb

## 2018-03-30 DIAGNOSIS — Z7902 Long term (current) use of antithrombotics/antiplatelets: Secondary | ICD-10-CM | POA: Insufficient documentation

## 2018-03-30 DIAGNOSIS — Z833 Family history of diabetes mellitus: Secondary | ICD-10-CM | POA: Insufficient documentation

## 2018-03-30 DIAGNOSIS — Z7982 Long term (current) use of aspirin: Secondary | ICD-10-CM | POA: Insufficient documentation

## 2018-03-30 DIAGNOSIS — I651 Occlusion and stenosis of basilar artery: Secondary | ICD-10-CM | POA: Insufficient documentation

## 2018-03-30 DIAGNOSIS — Z87891 Personal history of nicotine dependence: Secondary | ICD-10-CM

## 2018-03-30 DIAGNOSIS — F17200 Nicotine dependence, unspecified, uncomplicated: Secondary | ICD-10-CM | POA: Insufficient documentation

## 2018-03-30 DIAGNOSIS — H409 Unspecified glaucoma: Secondary | ICD-10-CM | POA: Insufficient documentation

## 2018-03-30 DIAGNOSIS — I6521 Occlusion and stenosis of right carotid artery: Secondary | ICD-10-CM | POA: Insufficient documentation

## 2018-03-30 DIAGNOSIS — R7303 Prediabetes: Secondary | ICD-10-CM | POA: Insufficient documentation

## 2018-03-30 DIAGNOSIS — I10 Essential (primary) hypertension: Secondary | ICD-10-CM | POA: Insufficient documentation

## 2018-03-30 DIAGNOSIS — Z8673 Personal history of transient ischemic attack (TIA), and cerebral infarction without residual deficits: Secondary | ICD-10-CM | POA: Insufficient documentation

## 2018-03-30 DIAGNOSIS — Z8249 Family history of ischemic heart disease and other diseases of the circulatory system: Secondary | ICD-10-CM | POA: Insufficient documentation

## 2018-03-30 DIAGNOSIS — Z9889 Other specified postprocedural states: Secondary | ICD-10-CM | POA: Insufficient documentation

## 2018-03-30 DIAGNOSIS — Z95810 Presence of automatic (implantable) cardiac defibrillator: Secondary | ICD-10-CM | POA: Insufficient documentation

## 2018-03-30 DIAGNOSIS — Z90710 Acquired absence of both cervix and uterus: Secondary | ICD-10-CM | POA: Insufficient documentation

## 2018-03-30 DIAGNOSIS — Z79899 Other long term (current) drug therapy: Secondary | ICD-10-CM | POA: Insufficient documentation

## 2018-03-30 DIAGNOSIS — F17210 Nicotine dependence, cigarettes, uncomplicated: Secondary | ICD-10-CM | POA: Insufficient documentation

## 2018-03-30 DIAGNOSIS — I671 Cerebral aneurysm, nonruptured: Secondary | ICD-10-CM

## 2018-03-30 HISTORY — DX: Personal history of nicotine dependence: Z87.891

## 2018-03-30 MED ORDER — TETANUS-DIPHTH-ACELL PERTUSSIS 5-2.5-18.5 LF-MCG/0.5 IM SUSP: 0.5000 mL | Freq: Once | INTRAMUSCULAR | 0 refills | Status: AC

## 2018-03-30 NOTE — Progress Notes (Signed)
Patient ID: Amy Howell, female    DOB: 1956/08/06  MRN: [id number]  CC: re-establish and Hypertension   Subjective: Amy Howell is a 62 y.o. female who presents for chronic disease management and to establish with me as PCP. 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 Estanislado Pandy), pre-DM, glaucoma  1.  Tob dep:  Smoked since age 2.  At highest she was smoking 4-6 cig/day.  Now down to 3 cig/day. Not quit ready to quit.   2.  HTN:  Compliant with Norvasc and trying to limit salt intake.  She has an arm cuff blood pressure device at home but has not been checking blood pressures. No chest pains or shortness of breath.  No lower extremity edema.  3.  Pre-DM: She tries to avoid sugary drinks and snacks. Not not getting in much exercise because she is afraid to walk outside of her apartment.  She reports that there are a lot of snakes in the area.     4.  Glaucoma:  Following by ophthalmologist.  Last seen 1 mth ago but does not recall the name of the specialist  Patient Active Problem List   Diagnosis Date Noted  . Tobacco dependence 03/30/2018  . Glaucoma of both eyes 03/30/2018  . History of loop recorder 03/30/2018  . Prediabetes 04/10/2017  . Cryptogenic stroke ([place])   . Aneurysm of middle cerebral artery   . Weight loss, unintentional   . Basilar artery stenosis   . Essential hypertension 03/26/2017     Current Outpatient Medications on File Prior to Visit  Medication Sig Dispense Refill  . acetaminophen (TYLENOL) 500 MG tablet Take 1,000 mg by mouth daily as needed (for pain.).     Marland Kitchen amLODipine (NORVASC) 10 MG tablet Take 1 tablet (10 mg total) by mouth daily. 90 tablet 3  . aspirin 325 MG tablet Take 1 tablet (325 mg total) by mouth daily. 90 tablet 3  .  atorvastatin (LIPITOR) 40 MG tablet Take 1 tablet (40 mg total) by mouth daily at 6 PM. 90 tablet 3  . clopidogrel (PLAVIX) 75 MG tablet Take 1 tablet (75 mg total) by mouth daily. (Patient not taking: Reported on 01/14/2018) 90 tablet 3  . feeding supplement, ENSURE ENLIVE, (ENSURE ENLIVE) LIQD Take 237 mLs by mouth 2 (two) times daily.     Marland Kitchen latanoprost (XALATAN) 0.005 % ophthalmic solution Place 1 drop into both eyes at bedtime.   11   No current facility-administered medications on file prior to visit.     No Known Allergies  Social History   Socioeconomic History  . Marital status: Single    Spouse name: Not on file  . Number of children: Not on file  . Years of education: Not on file  . Highest education level: Not on file  Occupational History  . Not on file  Social Needs  . Financial resource strain: Not on file  . Food insecurity:    Worry: Not on file    Inability: Not on file  . Transportation needs:    Medical: Not on file    Non-medical: Not on file  Tobacco Use  . Smoking status: Current Every Day Smoker    Packs/day: 0.25    Types: Cigarettes  . Smokeless tobacco: Never Used  .  Tobacco comment: 1 cigarette a day   Substance and Sexual Activity  . Alcohol use: No  . Drug use: No  . Sexual activity: Not on file  Lifestyle  . Physical activity:    Days per week: Not on file    Minutes per session: Not on file  . Stress: Not on file  Relationships  . Social connections:    Talks on phone: Not on file    Gets together: Not on file    Attends religious service: Not on file    Active member of club or organization: Not on file    Attends meetings of clubs or organizations: Not on file    Relationship status: Not on file  . Intimate partner violence:    Fear of current or ex partner: Not on file    Emotionally abused: Not on file    Physically abused: Not on file    Forced sexual activity: Not on file  Other Topics Concern  . Not on file  Social History  Narrative  . Not on file    Family History  Problem Relation Age of Onset  . Hypertension Father   . Diabetes Father     Past Surgical History:  Procedure Laterality Date  . ABDOMINAL HYSTERECTOMY    . CARDIAC DEFIBRILLATOR PLACEMENT    . gum operation    . IR 3D INDEPENDENT WKST  05/07/2017  . IR ANGIO INTRA EXTRACRAN SEL COM CAROTID INNOMINATE BILAT MOD SED  04/23/2017  . IR ANGIO INTRA EXTRACRAN SEL COM CAROTID INNOMINATE BILAT MOD SED  08/06/2017  . IR ANGIO INTRA EXTRACRAN SEL COM CAROTID INNOMINATE UNI R MOD SED  05/07/2017  . IR ANGIO VERTEBRAL SEL VERTEBRAL BILAT MOD SED  04/23/2017  . IR ANGIO VERTEBRAL SEL VERTEBRAL BILAT MOD SED  08/06/2017  . IR ANGIOGRAM FOLLOW UP STUDY  05/07/2017  . IR ANGIOGRAM SELECTIVE EACH ADDITIONAL VESSEL  05/07/2017  . IR RADIOLOGIST EVAL & MGMT  04/16/2017  . IR RADIOLOGIST EVAL & MGMT  05/29/2017  . IR TRANSCATH/EMBOLIZ  05/07/2017  . LOOP RECORDER INSERTION N/A 03/28/2017   Procedure: Loop Recorder Insertion;  Surgeon: Thompson Grayer, MD;  Location: [facility];  Service: Cardiovascular;  Laterality: N/A;  . RADIOLOGY WITH ANESTHESIA N/A 05/07/2017   Procedure: EMBOLIZATION;  Surgeon: Luanne Bras, MD;  Location: [facility];  Service: Radiology;  Laterality: N/A;  . RADIOLOGY WITH ANESTHESIA N/A 08/06/2017   Procedure: RADIOLOGY WITH ANESTHESIA STENTING;  Surgeon: Luanne Bras, MD;  Location: [facility];  Service: Radiology;  Laterality: N/A;  . TEE WITHOUT CARDIOVERSION N/A 03/28/2017   Procedure: TRANSESOPHAGEAL ECHOCARDIOGRAM (TEE);  Surgeon: Lelon Perla, MD;  Location: [facility];  Service: Cardiovascular;  Laterality: N/A;    ROS: Review of Systems Negative except as stated above PHYSICAL EXAM: BP (!) 158/75   Pulse 65   Temp 98.1 F (36.7 C) (Oral)   Resp 16   Ht 5' 0.5" (1.537 m)   Wt 111 lb 9.6 oz (50.6 kg)   SpO2 97%   BMI 21.44 kg/m   Wt Readings from Last 3 Encounters:  03/30/18 111 lb 9.6 oz (50.6 kg)  01/14/18 107 lb  (48.5 kg)  12/29/17 106 lb 9.6 oz (48.4 kg)   Repeat BP 140/60  Physical Exam  General appearance - alert, well appearing, and in no distress Mental status - alert, oriented to person, place, and time, normal mood, behavior, speech, dress, motor activity, and thought processes Eyes - pupils  equal and reactive, extraocular eye movements intact Mouth - mucous membranes moist, pharynx normal without lesions Neck - supple, no significant adenopathy Chest - clear to auscultation, no wheezes, rales or rhonchi, symmetric air entry Heart - normal rate, regular rhythm, normal S1, S2, no murmurs, rubs, clicks or gallops Extremities - peripheral pulses normal, no pedal edema, no clubbing or cyanosis  Lab Results  Component Value Date   WBC 6.2 08/01/2017   HGB 12.1 08/01/2017   HCT 38.9 08/01/2017   MCV 93.1 08/01/2017   PLT 163 08/01/2017     Chemistry      Component Value Date/Time   NA 139 08/01/2017 1134   NA 141 03/18/2017 1044   K 4.0 08/01/2017 1134   CL 106 08/01/2017 1134   CO2 25 08/01/2017 1134   BUN 20 08/01/2017 1134   BUN 16 03/18/2017 1044   CREATININE 0.83 08/01/2017 1134      Component Value Date/Time   CALCIUM 9.4 08/01/2017 1134   ALKPHOS 115 12/29/2017 0937   AST 17 12/29/2017 0937   ALT 18 12/29/2017 0937   BILITOT 0.2 12/29/2017 0937      ASSESSMENT AND PLAN: 1. Essential hypertension Repeat blood pressure better today.  Goal is 130/80 or lower.  Patient encouraged to check blood pressures at least 2 times a week and record readings to present on next visit.  Continue amlodipine  2. Tobacco dependence Patient advised to quit smoking. Discussed health risks associated with smoking including lung and other types of cancers, chronic lung diseases and CV risks; in her case, this increases risk of recurrent CVA. Pt not ready to give trail of quitting.  Discussed methods to help quit including quitting cold [place], use of NRT.  Patient not ready to give a  trial of quitting Less than 5 minutes spent on counseling  3. Prediabetes Encourage some form of regular aerobic exercise at least 3 days a week for 30 minutes  4. Glaucoma of both eyes, unspecified glaucoma type -Followed by ophthalmology.  5. History of CVA (cerebrovascular accident) 6. History of loop recorder 7. Basilar artery stenosis 8. Aneurysm of middle cerebral artery -Continue aspirin and Plavix.  Continue Lipitor. Patient followed by Dr. Joylene Grapes and Deveshwar  Patient was given the opportunity to ask questions.  Patient verbalized understanding of the plan and was able to repeat key elements of the plan.   No orders of the defined types were placed in this encounter.    Requested Prescriptions   Signed Prescriptions Disp Refills  . Tdap (BOOSTRIX) 5-2.5-18.5 LF-MCG/0.5 injection 0.5 mL 0    Sig: Inject 0.5 mLs into the muscle once for 1 dose.    Return in about 4 months (around 07/30/2018).  Karle Plumber, MD, FACP

## 2018-03-30 NOTE — Patient Instructions (Signed)
Please check your blood pressure readings 2-3 times a week and bring in readings on next visit.  The goal is to be 130/80 or lower.   Try to limit salt in the foods.  Smoking increases your risk significantly for a recurrent stroke.  Consider quitting completely.  Cut back on drinking sodas, sweet tea and eating sweet snacks.

## 2018-03-30 NOTE — Progress Notes (Signed)
Pt states she has b/l leg pain sometimes in the morning

## 2018-03-31 ENCOUNTER — Other Ambulatory Visit: Payer: Self-pay | Admitting: Pharmacist

## 2018-03-31 MED ORDER — TETANUS-DIPHTH-ACELL PERTUSSIS 5-2.5-18.5 LF-MCG/0.5 IM SUSP: 0.5000 mL | INTRAMUSCULAR | 0 refills | Status: DC

## 2018-04-01 ENCOUNTER — Ambulatory Visit (INDEPENDENT_AMBULATORY_CARE_PROVIDER_SITE_OTHER): Payer: Self-pay | Admitting: *Deleted

## 2018-04-01 DIAGNOSIS — I639 Cerebral infarction, unspecified: Secondary | ICD-10-CM

## 2018-04-02 NOTE — Progress Notes (Signed)
Carelink Summary Report / Loop Recorder 

## 2018-04-03 LAB — CUP PACEART REMOTE DEVICE CHECK
Implantable Pulse Generator Implant Date: 20180427
MDC IDC SESS DTM: 20190330003914

## 2018-04-23 MED FILL — $BRILINTA 90 MG TABLET: 90 | 40 days supply | Qty: 60 | Fill #3

## 2018-04-23 MED FILL — AMLODIPINE BESYLATE 10 MG T: 10 | 30 days supply | Qty: 30 | Fill #8

## 2018-04-23 MED FILL — ?ATORVASTATIN 40MG TABLET: 40 | 30 days supply | Qty: 30 | Fill #9

## 2018-04-27 LAB — CUP PACEART REMOTE DEVICE CHECK
Implantable Pulse Generator Implant Date: 20180427
MDC IDC SESS DTM: 20190502003856

## 2018-04-29 MED FILL — $BOOSTRIX VACCINE SYRINGE: 5-2.5-18.5 | 1 days supply | Qty: 1 | Fill #0

## 2018-05-04 ENCOUNTER — Ambulatory Visit (INDEPENDENT_AMBULATORY_CARE_PROVIDER_SITE_OTHER): Payer: Self-pay | Admitting: *Deleted

## 2018-05-04 DIAGNOSIS — I639 Cerebral infarction, unspecified: Secondary | ICD-10-CM

## 2018-05-05 NOTE — Progress Notes (Signed)
Carelink Summary Report / Loop Recorder 

## 2018-05-25 MED FILL — ATORVASTATIN CALCIUM 40 MG: 40 | 30 days supply | Qty: 30 | Fill #10

## 2018-05-25 MED FILL — AMLODIPINE BESYLATE 10 MG T: 10 | 30 days supply | Qty: 30 | Fill #9

## 2018-06-08 ENCOUNTER — Ambulatory Visit (INDEPENDENT_AMBULATORY_CARE_PROVIDER_SITE_OTHER): Payer: Self-pay | Admitting: *Deleted

## 2018-06-08 DIAGNOSIS — I639 Cerebral infarction, unspecified: Secondary | ICD-10-CM

## 2018-06-08 NOTE — Progress Notes (Signed)
Carelink Summary Report / Loop Recorder 

## 2018-06-10 LAB — CUP PACEART REMOTE DEVICE CHECK
Date Time Interrogation Session: 20190604013818
Implantable Pulse Generator Implant Date: 20180427

## 2018-06-24 MED FILL — ATORVASTATIN CALCIUM 40 MG: 40 | 30 days supply | Qty: 30 | Fill #11

## 2018-06-24 MED FILL — AMLODIPINE BESYLATE 10 MG T: 10 | 30 days supply | Qty: 30 | Fill #10

## 2018-06-25 ENCOUNTER — Other Ambulatory Visit: Payer: Self-pay

## 2018-06-25 NOTE — Telephone Encounter (Signed)
We got a request from our pharmacy for a refill on Brilinta for this patient, her chart shows she's taking aspirin and plavix, but she has been filling brilinta through our pharmacy and the patient assistance program since November 2018. We're not sure what the patient is supposed to be taking at this point, could you please clarify which medication she is meant to be on and send a refill on the brilinta if it is what she is to continue taking. Thank you.

## 2018-06-26 ENCOUNTER — Telehealth: Payer: Self-pay | Admitting: Radiology

## 2018-06-26 ENCOUNTER — Encounter (HOSPITAL_COMMUNITY): Payer: Self-pay | Admitting: *Deleted

## 2018-06-26 MED FILL — $BRILINTA 90 MG TABLET: 90 | 60 days supply | Qty: 60 | Fill #0

## 2018-06-26 NOTE — Progress Notes (Signed)
   Pharmacy calls for verification of medication for this pt. And refill  Discussed with Dr Estanislado Pandy We have reviewed pts chart.  Brilinta 45 mg in am and 45 mg in pm  Refill approved and sent to [facility] [place] Fax: [phone]  Epic medication list has been updated.

## 2018-07-08 ENCOUNTER — Ambulatory Visit: Payer: Self-pay

## 2018-07-09 ENCOUNTER — Ambulatory Visit (INDEPENDENT_AMBULATORY_CARE_PROVIDER_SITE_OTHER): Payer: Self-pay | Admitting: *Deleted

## 2018-07-09 DIAGNOSIS — I639 Cerebral infarction, unspecified: Secondary | ICD-10-CM

## 2018-07-10 NOTE — Progress Notes (Signed)
Carelink Summary Report / Loop Recorder 

## 2018-07-11 LAB — CUP PACEART REMOTE DEVICE CHECK
Date Time Interrogation Session: 20190707020958
Implantable Pulse Generator Implant Date: 20180427

## 2018-07-22 ENCOUNTER — Ambulatory Visit: Payer: Self-pay | Attending: Family Medicine

## 2018-07-22 MED FILL — LATANOPROST 0.005% EYE DRP: 0.005 | 25 days supply | Qty: 3 | Fill #0

## 2018-07-22 MED FILL — DORZOLAMIDE HCL 2% EYE DRP: 2 | 75 days supply | Qty: 10 | Fill #2

## 2018-07-22 MED FILL — TIMOLOL 0.5% EYE DROPS: 0.5 | 37 days supply | Qty: 5 | Fill #3

## 2018-07-23 ENCOUNTER — Other Ambulatory Visit: Payer: Self-pay

## 2018-07-23 DIAGNOSIS — I10 Essential (primary) hypertension: Secondary | ICD-10-CM

## 2018-07-23 DIAGNOSIS — E782 Mixed hyperlipidemia: Secondary | ICD-10-CM

## 2018-07-23 MED ORDER — ATORVASTATIN CALCIUM 40 MG PO TABS: 40.0000 mg | ORAL_TABLET | Freq: Every day | ORAL | 0 refills | Status: DC

## 2018-07-23 MED ORDER — AMLODIPINE BESYLATE 10 MG PO TABS: 10.0000 mg | ORAL_TABLET | Freq: Every day | ORAL | 0 refills | Status: DC

## 2018-07-23 MED FILL — ?ATORVASTATIN 40MG TABLET: 40 | 30 days supply | Qty: 30 | Fill #0

## 2018-07-23 MED FILL — AMLODIPINE BESYLATE 10 MG T: 10 | 30 days supply | Qty: 30 | Fill #0

## 2018-07-31 ENCOUNTER — Ambulatory Visit: Payer: Self-pay | Admitting: Internal Medicine

## 2018-08-11 ENCOUNTER — Ambulatory Visit (INDEPENDENT_AMBULATORY_CARE_PROVIDER_SITE_OTHER): Payer: Self-pay | Admitting: *Deleted

## 2018-08-11 DIAGNOSIS — I639 Cerebral infarction, unspecified: Secondary | ICD-10-CM

## 2018-08-12 NOTE — Progress Notes (Signed)
Carelink Summary Report / Loop Recorder 

## 2018-08-19 LAB — CUP PACEART REMOTE DEVICE CHECK
Date Time Interrogation Session: 20190809020802
Implantable Pulse Generator Implant Date: 20180427

## 2018-08-20 ENCOUNTER — Ambulatory Visit: Payer: Self-pay | Attending: Internal Medicine | Admitting: Internal Medicine

## 2018-08-20 VITALS — BP 170/76 | HR 77 | Temp 97.9°F | Resp 16 | Wt 115.6 lb

## 2018-08-20 DIAGNOSIS — F17200 Nicotine dependence, unspecified, uncomplicated: Secondary | ICD-10-CM

## 2018-08-20 DIAGNOSIS — F17210 Nicotine dependence, cigarettes, uncomplicated: Secondary | ICD-10-CM | POA: Insufficient documentation

## 2018-08-20 DIAGNOSIS — Z9889 Other specified postprocedural states: Secondary | ICD-10-CM | POA: Insufficient documentation

## 2018-08-20 DIAGNOSIS — Z7982 Long term (current) use of aspirin: Secondary | ICD-10-CM | POA: Insufficient documentation

## 2018-08-20 DIAGNOSIS — H409 Unspecified glaucoma: Secondary | ICD-10-CM | POA: Insufficient documentation

## 2018-08-20 DIAGNOSIS — Z95810 Presence of automatic (implantable) cardiac defibrillator: Secondary | ICD-10-CM | POA: Insufficient documentation

## 2018-08-20 DIAGNOSIS — I651 Occlusion and stenosis of basilar artery: Secondary | ICD-10-CM | POA: Insufficient documentation

## 2018-08-20 DIAGNOSIS — Z8249 Family history of ischemic heart disease and other diseases of the circulatory system: Secondary | ICD-10-CM | POA: Insufficient documentation

## 2018-08-20 DIAGNOSIS — E782 Mixed hyperlipidemia: Secondary | ICD-10-CM | POA: Insufficient documentation

## 2018-08-20 DIAGNOSIS — I6521 Occlusion and stenosis of right carotid artery: Secondary | ICD-10-CM | POA: Insufficient documentation

## 2018-08-20 DIAGNOSIS — R7303 Prediabetes: Secondary | ICD-10-CM | POA: Insufficient documentation

## 2018-08-20 DIAGNOSIS — Z833 Family history of diabetes mellitus: Secondary | ICD-10-CM | POA: Insufficient documentation

## 2018-08-20 DIAGNOSIS — Z79899 Other long term (current) drug therapy: Secondary | ICD-10-CM | POA: Insufficient documentation

## 2018-08-20 DIAGNOSIS — I10 Essential (primary) hypertension: Secondary | ICD-10-CM | POA: Insufficient documentation

## 2018-08-20 DIAGNOSIS — Z8673 Personal history of transient ischemic attack (TIA), and cerebral infarction without residual deficits: Secondary | ICD-10-CM | POA: Insufficient documentation

## 2018-08-20 LAB — GLUCOSE, POCT (MANUAL RESULT ENTRY): POC Glucose: 168 mg/dl — AB (ref 70–99)

## 2018-08-20 MED ORDER — TICAGRELOR 90 MG PO TABS: 45.0000 mg | ORAL_TABLET | Freq: Two times a day (BID) | ORAL | 1 refills | Status: DC

## 2018-08-20 MED ORDER — AMLODIPINE BESYLATE 10 MG PO TABS: 10.0000 mg | ORAL_TABLET | Freq: Every day | ORAL | 1 refills | Status: DC

## 2018-08-20 MED ORDER — ATORVASTATIN CALCIUM 40 MG PO TABS: 40.0000 mg | ORAL_TABLET | Freq: Every day | ORAL | 1 refills | Status: DC

## 2018-08-20 MED FILL — $BRILINTA 90 MG TABLET: 90 | 30 days supply | Qty: 30 | Fill #0

## 2018-08-20 MED FILL — AMLODIPINE BESYLATE 10 MG T: 10 | 30 days supply | Qty: 30 | Fill #0

## 2018-08-20 MED FILL — ATORVASTATIN CALCIUM 40 MG: 40 | 30 days supply | Qty: 30 | Fill #0

## 2018-08-20 NOTE — Progress Notes (Signed)
Patient ID: Amy Howell, female    DOB: Feb 27, 1956  MRN: [id number]  CC: Hypertension and Diabetes (pre-diabetes)   Subjective: Amy Howell is a 62 y.o. female who presents for chronic disease management.  Last seen April of this year. 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 Estanislado Pandy), pre-DM, glaucoma  HTN:  Brings log with her.  Checks blood pressure daily with an automated upper arm device.  SBP 100s-130s but most in the 100-120s. DBP 60-70s Compliant with Norvasc and salt restriction No CP/SOB/LE edema Walks 6 blocks 5 days a wk to get grandchild to school bus stop  CVA/Basilar artery stenosis/RT ICA stenosis:  Changed from Plavix to Brilinta by Dr. Estanislado Pandy since last visit.  She was told to continue aspirin also.  Tob dep:  pk last 4-5 days.  Smokes out of boredom.  She wants to quit but is not interested in trying any nicotine replacement therapy or Chantix.  Patient Active Problem List   Diagnosis Date Noted  . Tobacco dependence 03/30/2018  . Glaucoma of both eyes 03/30/2018  . History of loop recorder 03/30/2018  . Prediabetes 04/10/2017  . Cryptogenic stroke ([place])   . Aneurysm of middle cerebral artery   . Weight loss, unintentional   . Basilar artery stenosis   . Essential hypertension 03/26/2017     Current Outpatient Medications on File Prior to Visit  Medication Sig Dispense Refill  . acetaminophen (TYLENOL) 500 MG tablet Take 1,000 mg by mouth daily as needed (for pain.).     Marland Kitchen aspirin 325 MG tablet Take 1 tablet (325 mg total) by mouth daily. 90 tablet 3  . feeding supplement, ENSURE ENLIVE, (ENSURE ENLIVE) LIQD Take 237 mLs by mouth 2 (two) times daily.     Marland Kitchen latanoprost (XALATAN) 0.005 % ophthalmic solution Place 1 drop into both eyes at  bedtime.   11  . Tdap (BOOSTRIX) 5-2.5-18.5 LF-MCG/0.5 injection Inject 0.5 mLs into the muscle as directed. 0.5 mL 0  . timolol (TIMOPTIC) 0.5 % ophthalmic solution INSTILL 1 DROP INTO LEFT EYE EVERY MORNING  99   No current facility-administered medications on file prior to visit.     No Known Allergies  Social History   Socioeconomic History  . Marital status: Single    Spouse name: Not on file  . Number of children: Not on file  . Years of education: Not on file  . Highest education level: Not on file  Occupational History  . Not on file  Social Needs  . Financial resource strain: Not on file  . Food insecurity:    Worry: Not on file    Inability: Not on file  . Transportation needs:    Medical: Not on file    Non-medical: Not on file  Tobacco Use  . Smoking status: Current Every Day Smoker    Packs/day: 0.25    Types: Cigarettes  . Smokeless tobacco: Never Used  . Tobacco comment: 1 cigarette a day   Substance and Sexual Activity  . Alcohol use: No  . Drug use: No  . Sexual activity: Not on file  Lifestyle  . Physical activity:    Days per week: Not on file    Minutes per session: Not on file  . Stress: Not on file  Relationships  .  Social connections:    Talks on phone: Not on file    Gets together: Not on file    Attends religious service: Not on file    Active member of club or organization: Not on file    Attends meetings of clubs or organizations: Not on file    Relationship status: Not on file  . Intimate partner violence:    Fear of current or ex partner: Not on file    Emotionally abused: Not on file    Physically abused: Not on file    Forced sexual activity: Not on file  Other Topics Concern  . Not on file  Social History Narrative  . Not on file    Family History  Problem Relation Age of Onset  . Hypertension Father   . Diabetes Father     Past Surgical History:  Procedure Laterality Date  . ABDOMINAL HYSTERECTOMY    . CARDIAC  DEFIBRILLATOR PLACEMENT    . gum operation    . IR 3D INDEPENDENT WKST  05/07/2017  . IR ANGIO INTRA EXTRACRAN SEL COM CAROTID INNOMINATE BILAT MOD SED  04/23/2017  . IR ANGIO INTRA EXTRACRAN SEL COM CAROTID INNOMINATE BILAT MOD SED  08/06/2017  . IR ANGIO INTRA EXTRACRAN SEL COM CAROTID INNOMINATE UNI R MOD SED  05/07/2017  . IR ANGIO VERTEBRAL SEL VERTEBRAL BILAT MOD SED  04/23/2017  . IR ANGIO VERTEBRAL SEL VERTEBRAL BILAT MOD SED  08/06/2017  . IR ANGIOGRAM FOLLOW UP STUDY  05/07/2017  . IR ANGIOGRAM SELECTIVE EACH ADDITIONAL VESSEL  05/07/2017  . IR RADIOLOGIST EVAL & MGMT  04/16/2017  . IR RADIOLOGIST EVAL & MGMT  05/29/2017  . IR TRANSCATH/EMBOLIZ  05/07/2017  . LOOP RECORDER INSERTION N/A 03/28/2017   Procedure: Loop Recorder Insertion;  Surgeon: Thompson Grayer, MD;  Location: [place] [facility];  Service: Cardiovascular;  Laterality: N/A;  . RADIOLOGY WITH ANESTHESIA N/A 05/07/2017   Procedure: EMBOLIZATION;  Surgeon: Luanne Bras, MD;  Location: [place];  Service: Radiology;  Laterality: N/A;  . RADIOLOGY WITH ANESTHESIA N/A 08/06/2017   Procedure: RADIOLOGY WITH ANESTHESIA STENTING;  Surgeon: Luanne Bras, MD;  Location: [facility];  Service: Radiology;  Laterality: N/A;  . TEE WITHOUT CARDIOVERSION N/A 03/28/2017   Procedure: TRANSESOPHAGEAL ECHOCARDIOGRAM (TEE);  Surgeon: Lelon Perla, MD;  Location: [facility];  Service: Cardiovascular;  Laterality: N/A;    ROS: Review of Systems Negative except as above PHYSICAL EXAM: BP (!) 170/76   Pulse 77   Temp 97.9 F (36.6 C) (Oral)   Resp 16   Wt 115 lb 9.6 oz (52.4 kg)   SpO2 99%   BMI 22.20 kg/m   BP Physical Exam  General appearance - alert, well appearing, and in no distress Mental status - normal mood, behavior, speech, dress, motor activity, and thought processes Neck - supple, no significant adenopathy Chest - clear to auscultation, no wheezes, rales or rhonchi, symmetric air entry Heart - normal rate, regular rhythm, normal  S1, S2, no murmurs, rubs, clicks or gallops Extremities - peripheral pulses normal, no pedal edema, no clubbing or cyanosis  Results for orders placed or performed in visit on 08/20/18  POCT glucose (manual entry)  Result Value Ref Range   POC Glucose 168 (A) 70 - 99 mg/dl    ASSESSMENT AND PLAN: 1. Essential hypertension Home blood pressure readings are in the targeted range.  She probably has a bit of whitecoat hypertension.  Encourage her to continue low-salt diet and to continue  monitoring blood pressure with goal of 130/80 or lower.  No changes made  2. Mixed hyperlipidemia Continue atorvastatin.  Refill given.  3. History of CVA (cerebrovascular accident) Discussed the importance of good blood pressure control.  She will continue aspirin and Brilinta  4. Prediabetes - POCT glucose (manual entry) - POCT glycosylated hemoglobin (Hb A1C)  5. Tobacco dependence Encouraged to set a quit date   Patient was given the opportunity to ask questions.  Patient verbalized understanding of the plan and was able to repeat key elements of the plan.   Orders Placed This Encounter  Procedures  . Basic Metabolic Panel  . CBC  . Hemoglobin A1c  . POCT glucose (manual entry)  . POCT glycosylated hemoglobin (Hb A1C)     Requested Prescriptions   Signed Prescriptions Disp Refills  . amLODipine (NORVASC) 10 MG tablet 90 tablet 1    Sig: Take 1 tablet (10 mg total) by mouth daily.  Marland Kitchen atorvastatin (LIPITOR) 40 MG tablet 90 tablet 1    Sig: Take 1 tablet (40 mg total) by mouth daily at 6 PM.  . ticagrelor (BRILINTA) 90 MG TABS tablet 90 tablet 1    Sig: Take 0.5 tablets (45 mg total) by mouth 2 (two) times daily.    Return in about 4 months (around 12/20/2018).  Karle Plumber, MD, FACP

## 2018-08-20 NOTE — Patient Instructions (Signed)
I encourage you to set a quit date beyond which she will no longer purchase cigarettes.  Try to find hobbies to occupy your time.Marland Kitchen

## 2018-08-21 LAB — CBC
HEMATOCRIT: 36.9 % (ref 34.0–46.6)
Hemoglobin: 11.8 g/dL (ref 11.1–15.9)
MCH: 29.1 pg (ref 26.6–33.0)
MCHC: 32 g/dL (ref 31.5–35.7)
MCV: 91 fL (ref 79–97)
PLATELETS: 167 10*3/uL (ref 150–450)
RBC: 4.06 x10E6/uL (ref 3.77–5.28)
RDW: 12.8 % (ref 12.3–15.4)
WBC: 7 10*3/uL (ref 3.4–10.8)

## 2018-08-21 LAB — BASIC METABOLIC PANEL
BUN / CREAT RATIO: 16 (ref 12–28)
BUN: 15 mg/dL (ref 8–27)
CO2: 22 mmol/L (ref 20–29)
Calcium: 9.5 mg/dL (ref 8.7–10.3)
Chloride: 105 mmol/L (ref 96–106)
Creatinine, Ser: 0.95 mg/dL (ref 0.57–1.00)
GFR calc non Af Amer: 63 mL/min/{1.73_m2} (ref >59)
GFR, EST AFRICAN AMERICAN: 73 mL/min/{1.73_m2} (ref >59)
GLUCOSE: 119 mg/dL — AB (ref 65–99)
Potassium: 4 mmol/L (ref 3.5–5.2)
SODIUM: 140 mmol/L (ref 134–144)

## 2018-08-21 LAB — HEMOGLOBIN A1C
Est. average glucose Bld gHb Est-mCnc: 140 mg/dL
HEMOGLOBIN A1C: 6.5 % — AB (ref 4.8–5.6)

## 2018-08-24 ENCOUNTER — Telehealth: Payer: Self-pay

## 2018-08-24 NOTE — Telephone Encounter (Signed)
Contacted pt to go over lab results pt didn't answer left a detailed vm informing pt of results and if she has any questions or concerns to give me a call   If pt calls back please give results : screening test is in the range for DM. Blood count and kidney function nl. Please give her a f/u appt to discuss further.

## 2018-08-28 LAB — CUP PACEART REMOTE DEVICE CHECK
Date Time Interrogation Session: 20190911023516
Implantable Pulse Generator Implant Date: 20180427

## 2018-09-14 ENCOUNTER — Ambulatory Visit (INDEPENDENT_AMBULATORY_CARE_PROVIDER_SITE_OTHER): Payer: Self-pay | Admitting: *Deleted

## 2018-09-14 DIAGNOSIS — I639 Cerebral infarction, unspecified: Secondary | ICD-10-CM

## 2018-09-15 NOTE — Progress Notes (Signed)
Carelink Summary Report / Loop Recorder 

## 2018-09-28 LAB — CUP PACEART REMOTE DEVICE CHECK
Implantable Pulse Generator Implant Date: 20180427
MDC IDC SESS DTM: 20191014023558

## 2018-09-28 MED FILL — $BRILINTA 90 MG TABLET: 90 | 30 days supply | Qty: 30 | Fill #1

## 2018-09-28 MED FILL — AMLODIPINE BESYLATE 10 MG T: 10 | 30 days supply | Qty: 30 | Fill #1

## 2018-09-28 MED FILL — ATORVASTATIN CALCIUM 40 MG: 40 | 30 days supply | Qty: 30 | Fill #1

## 2018-10-16 ENCOUNTER — Ambulatory Visit (INDEPENDENT_AMBULATORY_CARE_PROVIDER_SITE_OTHER): Payer: Self-pay | Admitting: *Deleted

## 2018-10-16 DIAGNOSIS — I639 Cerebral infarction, unspecified: Secondary | ICD-10-CM

## 2018-10-19 NOTE — Progress Notes (Signed)
Carelink Summary Report / Loop Recorder 

## 2018-10-27 MED FILL — ATORVASTATIN CALCIUM 40 MG: 40 | 30 days supply | Qty: 30 | Fill #2

## 2018-10-27 MED FILL — AMLODIPINE BESYLATE 10 MG T: 10 | 30 days supply | Qty: 30 | Fill #2

## 2018-11-18 ENCOUNTER — Ambulatory Visit (INDEPENDENT_AMBULATORY_CARE_PROVIDER_SITE_OTHER): Payer: Self-pay

## 2018-11-18 DIAGNOSIS — I639 Cerebral infarction, unspecified: Secondary | ICD-10-CM

## 2018-11-19 NOTE — Progress Notes (Signed)
Carelink Summary Report / Loop Recorder 

## 2018-11-27 MED FILL — ATORVASTATIN CALCIUM 40 MG: 40 | 30 days supply | Qty: 30 | Fill #3

## 2018-11-27 MED FILL — BRILINTA 90 MG TABLET: 90 | 30 days supply | Qty: 30 | Fill #2

## 2018-11-27 MED FILL — AMLODIPINE BESYLATE 10 MG T: 10 | 30 days supply | Qty: 30 | Fill #3

## 2018-12-06 LAB — CUP PACEART REMOTE DEVICE CHECK
Date Time Interrogation Session: 20191116034108
MDC IDC PG IMPLANT DT: 20180427

## 2018-12-12 LAB — CUP PACEART REMOTE DEVICE CHECK
Date Time Interrogation Session: 20191219044133
MDC IDC PG IMPLANT DT: 20180427

## 2018-12-16 MED FILL — BRIMONIDINE 0.2% EYE DROP: 0.2 | 30 days supply | Qty: 10 | Fill #0

## 2018-12-21 ENCOUNTER — Ambulatory Visit: Payer: Self-pay | Admitting: Internal Medicine

## 2018-12-21 ENCOUNTER — Ambulatory Visit (INDEPENDENT_AMBULATORY_CARE_PROVIDER_SITE_OTHER): Payer: Self-pay

## 2018-12-21 DIAGNOSIS — I639 Cerebral infarction, unspecified: Secondary | ICD-10-CM

## 2018-12-22 NOTE — Progress Notes (Signed)
Carelink Summary Report / Loop Recorder 

## 2018-12-23 LAB — CUP PACEART REMOTE DEVICE CHECK
Implantable Pulse Generator Implant Date: 20180427
MDC IDC SESS DTM: 20200121104008

## 2018-12-29 MED FILL — ATORVASTATIN CALCIUM 40 MG: 40 | 30 days supply | Qty: 30 | Fill #4

## 2018-12-29 MED FILL — AMLODIPINE BESYLATE 10 MG T: 10 | 30 days supply | Qty: 30 | Fill #4

## 2019-01-05 ENCOUNTER — Telehealth: Payer: Self-pay | Admitting: Internal Medicine

## 2019-01-05 NOTE — Telephone Encounter (Signed)
  Daughter is calling for patient to see what would need to be done to have her device removed

## 2019-01-08 ENCOUNTER — Ambulatory Visit: Payer: Medicare Other | Attending: Internal Medicine | Admitting: Internal Medicine

## 2019-01-08 ENCOUNTER — Encounter: Payer: Self-pay | Admitting: Internal Medicine

## 2019-01-08 VITALS — BP 143/81 | HR 75 | Temp 97.9°F | Ht 60.5 in | Wt 116.0 lb

## 2019-01-08 DIAGNOSIS — Z833 Family history of diabetes mellitus: Secondary | ICD-10-CM | POA: Diagnosis not present

## 2019-01-08 DIAGNOSIS — F17200 Nicotine dependence, unspecified, uncomplicated: Secondary | ICD-10-CM

## 2019-01-08 DIAGNOSIS — Z95810 Presence of automatic (implantable) cardiac defibrillator: Secondary | ICD-10-CM | POA: Insufficient documentation

## 2019-01-08 DIAGNOSIS — I10 Essential (primary) hypertension: Secondary | ICD-10-CM

## 2019-01-08 DIAGNOSIS — F17210 Nicotine dependence, cigarettes, uncomplicated: Secondary | ICD-10-CM | POA: Diagnosis not present

## 2019-01-08 DIAGNOSIS — H409 Unspecified glaucoma: Secondary | ICD-10-CM | POA: Insufficient documentation

## 2019-01-08 DIAGNOSIS — Z79899 Other long term (current) drug therapy: Secondary | ICD-10-CM | POA: Diagnosis not present

## 2019-01-08 DIAGNOSIS — Z7982 Long term (current) use of aspirin: Secondary | ICD-10-CM | POA: Insufficient documentation

## 2019-01-08 DIAGNOSIS — Z8673 Personal history of transient ischemic attack (TIA), and cerebral infarction without residual deficits: Secondary | ICD-10-CM | POA: Diagnosis not present

## 2019-01-08 DIAGNOSIS — Z1211 Encounter for screening for malignant neoplasm of colon: Secondary | ICD-10-CM

## 2019-01-08 DIAGNOSIS — Z2821 Immunization not carried out because of patient refusal: Secondary | ICD-10-CM

## 2019-01-08 DIAGNOSIS — E119 Type 2 diabetes mellitus without complications: Secondary | ICD-10-CM

## 2019-01-08 DIAGNOSIS — E1139 Type 2 diabetes mellitus with other diabetic ophthalmic complication: Secondary | ICD-10-CM | POA: Insufficient documentation

## 2019-01-08 DIAGNOSIS — Z8249 Family history of ischemic heart disease and other diseases of the circulatory system: Secondary | ICD-10-CM | POA: Insufficient documentation

## 2019-01-08 DIAGNOSIS — E782 Mixed hyperlipidemia: Secondary | ICD-10-CM | POA: Diagnosis not present

## 2019-01-08 DIAGNOSIS — R7303 Prediabetes: Secondary | ICD-10-CM

## 2019-01-08 DIAGNOSIS — E1136 Type 2 diabetes mellitus with diabetic cataract: Secondary | ICD-10-CM | POA: Insufficient documentation

## 2019-01-08 HISTORY — DX: Prediabetes: R73.03

## 2019-01-08 LAB — GLUCOSE, POCT (MANUAL RESULT ENTRY): POC GLUCOSE: 85 mg/dL (ref 70–99)

## 2019-01-08 LAB — POCT GLYCOSYLATED HEMOGLOBIN (HGB A1C): HBA1C, POC (CONTROLLED DIABETIC RANGE): 6.9 % (ref 0.0–7.0)

## 2019-01-08 MED ORDER — LISINOPRIL 5 MG PO TABS: 5.0000 mg | ORAL_TABLET | Freq: Every day | ORAL | 3 refills | Status: DC

## 2019-01-08 MED FILL — DORZOLAMIDE-TIMOLOL EYE DRP: 22.3-6.8 | 38 days supply | Qty: 10 | Fill #0

## 2019-01-08 MED FILL — BRIMONIDINE 0.2% EYE DROP: 0.2 | 18 days supply | Qty: 10 | Fill #0

## 2019-01-08 NOTE — Progress Notes (Signed)
Patient ID: Amy Howell, female    DOB: 1956-04-22  MRN: [id number]  CC: Follow-up   Subjective: Amy Howell is a 63 y.o. female who presents for chronic disease management.  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 Estanislado Pandy), pre-DM, glaucoma  HYPERTENSION Currently taking: see medication list Med Adherence: [x]  Yes    []  No Medication side effects: []  Yes    [x]  No Adherence with salt restriction: [x]  Yes    []  No Home Monitoring?: []  Yes    [x]  No, but does have a device SOB? []  Yes    [x]  No Chest Pain?: []  Yes    [x]  No Leg swelling?: []  Yes    [x]  No Headaches?: []  Yes    [x]  No Dizziness? []  Yes    [x]  No Comments:   Pre-DM:  Walks 3 blocks once a wk to pick up her grandchild from the bus stopand 1/2 a block every day to take out the thrash  A1c on last visit was 6.5 which was in the range for diabetes.  I had wanted to get her back in sooner but patient did not call to schedule a follow-up appointment.  She thinks the A1c was high because she had drank some sweet teeth that day. -She does have family history of diabetes.  Hx of CVA:  Pt states loop recorder will be removed next mth. She had it almost 2 yrs with no significant arrhythmia.  Compliant with taking aspirin, Brilinta and Lipitor.  She continues to smoke but "not much."  1 pack of cigarettes lasts about 3 weeks.  She feels she is not at increased risks because she is a light smoker  HM: Patient reports having received Tdap sometime last year at this clinic.  Patient states that was given to her in the lobby.  However I do not see it documented on her immunization list.  She declines flu shot and Prevnar 13.  She is due for mammogram and wants to hold off on being referred for  mammogram until her next visit and after she has had the loop recorder removed.  She is due for colonoscopy.  Reports having had 2 in the past when she lived in [place] and had polyps removed. Patient Active Problem List   Diagnosis Date Noted  . Tobacco dependence 03/30/2018  . Glaucoma of both eyes 03/30/2018  . History of loop recorder 03/30/2018  . Prediabetes 04/10/2017  . Cryptogenic stroke ([place])   . Aneurysm of middle cerebral artery   . Weight loss, unintentional   . Basilar artery stenosis   . Essential hypertension 03/26/2017     Current Outpatient Medications on File Prior to Visit  Medication Sig Dispense Refill  . acetaminophen (TYLENOL) 500 MG tablet Take 1,000 mg by mouth daily as needed (for pain.).     Marland Kitchen amLODipine (NORVASC) 10 MG tablet Take 1 tablet (10 mg total) by mouth daily. 90 tablet 1  . aspirin 325 MG tablet Take 1 tablet (325 mg total) by mouth daily. 90 tablet 3  . atorvastatin (LIPITOR) 40 MG tablet Take 1 tablet (40 mg total) by mouth daily at 6 PM. 90 tablet 1  . feeding supplement, ENSURE ENLIVE, (ENSURE ENLIVE) LIQD Take 237 mLs by mouth 2 (two) times daily.     Marland Kitchen latanoprost (XALATAN)  0.005 % ophthalmic solution Place 1 drop into both eyes at bedtime.   11  . ticagrelor (BRILINTA) 90 MG TABS tablet Take 0.5 tablets (45 mg total) by mouth 2 (two) times daily. 90 tablet 1  . timolol (TIMOPTIC) 0.5 % ophthalmic solution INSTILL 1 DROP INTO LEFT EYE EVERY MORNING  99  . Tdap (BOOSTRIX) 5-2.5-18.5 LF-MCG/0.5 injection Inject 0.5 mLs into the muscle as directed. (Patient not taking: Reported on 01/08/2019) 0.5 mL 0   No current facility-administered medications on file prior to visit.     No Known Allergies  Social History   Socioeconomic History  . Marital status: Single    Spouse name: Not on file  . Number of children: Not on file  . Years of education: Not on file  . Highest education level: Not on file  Occupational History  . Not on file    Social Needs  . Financial resource strain: Not on file  . Food insecurity:    Worry: Not on file    Inability: Not on file  . Transportation needs:    Medical: Not on file    Non-medical: Not on file  Tobacco Use  . Smoking status: Current Every Day Smoker    Packs/day: 0.25    Types: Cigarettes  . Smokeless tobacco: Never Used  . Tobacco comment: 1 cigarette a day   Substance and Sexual Activity  . Alcohol use: No  . Drug use: No  . Sexual activity: Not on file  Lifestyle  . Physical activity:    Days per week: Not on file    Minutes per session: Not on file  . Stress: Not on file  Relationships  . Social connections:    Talks on phone: Not on file    Gets together: Not on file    Attends religious service: Not on file    Active member of club or organization: Not on file    Attends meetings of clubs or organizations: Not on file    Relationship status: Not on file  . Intimate partner violence:    Fear of current or ex partner: Not on file    Emotionally abused: Not on file    Physically abused: Not on file    Forced sexual activity: Not on file  Other Topics Concern  . Not on file  Social History Narrative  . Not on file    Family History  Problem Relation Age of Onset  . Hypertension Father   . Diabetes Father     Past Surgical History:  Procedure Laterality Date  . ABDOMINAL HYSTERECTOMY    . CARDIAC DEFIBRILLATOR PLACEMENT    . gum operation    . IR 3D INDEPENDENT WKST  05/07/2017  . IR ANGIO INTRA EXTRACRAN SEL COM CAROTID INNOMINATE BILAT MOD SED  04/23/2017  . IR ANGIO INTRA EXTRACRAN SEL COM CAROTID INNOMINATE BILAT MOD SED  08/06/2017  . IR ANGIO INTRA EXTRACRAN SEL COM CAROTID INNOMINATE UNI R MOD SED  05/07/2017  . IR ANGIO VERTEBRAL SEL VERTEBRAL BILAT MOD SED  04/23/2017  . IR ANGIO VERTEBRAL SEL VERTEBRAL BILAT MOD SED  08/06/2017  . IR ANGIOGRAM FOLLOW UP STUDY  05/07/2017  . IR ANGIOGRAM SELECTIVE EACH ADDITIONAL VESSEL  05/07/2017  . IR RADIOLOGIST  EVAL & MGMT  04/16/2017  . IR RADIOLOGIST EVAL & MGMT  05/29/2017  . IR TRANSCATH/EMBOLIZ  05/07/2017  . LOOP RECORDER INSERTION N/A 03/28/2017   Procedure: Loop Recorder Insertion;  Surgeon: Thompson Grayer,  MD;  Location: [facility];  Service: Cardiovascular;  Laterality: N/A;  . RADIOLOGY WITH ANESTHESIA N/A 05/07/2017   Procedure: EMBOLIZATION;  Surgeon: Luanne Bras, MD;  Location: [facility];  Service: Radiology;  Laterality: N/A;  . RADIOLOGY WITH ANESTHESIA N/A 08/06/2017   Procedure: RADIOLOGY WITH ANESTHESIA STENTING;  Surgeon: Luanne Bras, MD;  Location: [facility];  Service: Radiology;  Laterality: N/A;  . TEE WITHOUT CARDIOVERSION N/A 03/28/2017   Procedure: TRANSESOPHAGEAL ECHOCARDIOGRAM (TEE);  Surgeon: Lelon Perla, MD;  Location: [facility];  Service: Cardiovascular;  Laterality: N/A;    ROS: Review of Systems Negative except as above PHYSICAL EXAM: BP (!) 143/81   Pulse 75   Temp 97.9 F (36.6 C) (Oral)   Ht 5' 0.5" (1.537 m)   Wt 116 lb (52.6 kg)   SpO2 98%   BMI 22.28 kg/m   Wt Readings from Last 3 Encounters:  01/08/19 116 lb (52.6 kg)  08/20/18 115 lb 9.6 oz (52.4 kg)  03/30/18 111 lb 9.6 oz (50.6 kg)  BP 140/60  Physical Exam  General appearance - alert, well appearing, and in no distress Mental status - normal mood, behavior, speech, dress, motor activity, and thought processes Mouth - mucous membranes moist, pharynx normal without lesions Neck - supple, no significant adenopathy Chest - clear to auscultation, no wheezes, rales or rhonchi, symmetric air entry Heart - normal rate, regular rhythm, normal S1, S2, no murmurs, rubs, clicks or gallops Extremities - peripheral pulses normal, no pedal edema, no clubbing or cyanosis  Results for orders placed or performed in visit on 01/08/19  POCT glucose (manual entry)  Result Value Ref Range   POC Glucose 85 70 - 99 mg/dl  POCT glycosylated hemoglobin (Hb A1C)  Result Value Ref Range   Hemoglobin  A1C     HbA1c POC (<> result, manual entry)     HbA1c, POC (prediabetic range)     HbA1c, POC (controlled diabetic range) 6.9 0.0 - 7.0 %    ASSESSMENT AND PLAN:  1. New onset type 2 diabetes mellitus (HCC) Discussed the importance of healthy eating habits, regular aerobic exercise (at least 150 minutes a week as tolerated) and medication compliance to achieve or maintain control of diabetes. -I recommend starting low-dose of metformin but patient declines.  She wants to work on improving her eating habits and exercising more and then be rechecked in 3 months.  Printed information given on healthy eating habits for diabetics. -Patient reports that she had an eye exam done today at Dr. Hannah Beat office - POCT glucose (manual entry) - POCT glycosylated hemoglobin (Hb A1C)  2. Essential hypertension Not at goal.  Continue amlodipine.  Add lisinopril - lisinopril (PRINIVIL,ZESTRIL) 5 MG tablet; Take 1 tablet (5 mg total) by mouth daily.  Dispense: 90 tablet; Refill: 3  3. Mixed hyperlipidemia Continue Pravachol  4. History of CVA (cerebrovascular accident) Discussed the importance of secondary prevention including good blood pressure, cholesterol, diabetes control and tobacco cessation.  5. Tobacco dependence Advised patient that any amount of smoking is too much and puts her at risk for cardiovascular events.  She is ambivalent about wanting to quit.  About 3 minutes spent on counseling.  6. 23-polyvalent pneumococcal polysaccharide vaccine declined Patient declines the Prevnar 13  7. Colon cancer screening  - Ambulatory referral to Gastroenterology  8. Influenza vaccination declined     Patient was given the opportunity to ask questions.  Patient verbalized understanding of the plan and was able to repeat  key elements of the plan.   No orders of the defined types were placed in this encounter.    Requested Prescriptions    No prescriptions requested or ordered in this  encounter    No follow-ups on file.  Karle Plumber, MD, FACP

## 2019-01-08 NOTE — Patient Instructions (Signed)
Your blood pressure is not well controlled.  The goal is 130/80 or lower.  Please check your blood pressure at least once a week.  We have added the medication lisinopril 5 mg daily to better control your blood pressure.  You have new diagnosis of diabetes.    Diabetes Mellitus and Nutrition, Adult When you have diabetes (diabetes mellitus), it is very important to have healthy eating habits because your blood sugar (glucose) levels are greatly affected by what you eat and drink. Eating healthy foods in the appropriate amounts, at about the same times every day, can help you:  Control your blood glucose.  Lower your risk of heart disease.  Improve your blood pressure.  Reach or maintain a healthy weight. Every person with diabetes is different, and each person has different needs for a meal plan. Your health care provider may recommend that you work with a diet and nutrition specialist (dietitian) to make a meal plan that is best for you. Your meal plan may vary depending on factors such as:  The calories you need.  The medicines you take.  Your weight.  Your blood glucose, blood pressure, and cholesterol levels.  Your activity level.  Other health conditions you have, such as heart or kidney disease. How do carbohydrates affect me? Carbohydrates, also called carbs, affect your blood glucose level more than any other type of food. Eating carbs naturally raises the amount of glucose in your blood. Carb counting is a method for keeping track of how many carbs you eat. Counting carbs is important to keep your blood glucose at a healthy level, especially if you use insulin or take certain oral diabetes medicines. It is important to know how many carbs you can safely have in each meal. This is different for every person. Your dietitian can help you calculate how many carbs you should have at each meal and for each snack. Foods that contain carbs include:  Bread, cereal, rice, pasta, and  crackers.  Potatoes and corn.  Peas, beans, and lentils.  Milk and yogurt.  Fruit and juice.  Desserts, such as cakes, cookies, ice cream, and candy. How does alcohol affect me? Alcohol can cause a sudden decrease in blood glucose (hypoglycemia), especially if you use insulin or take certain oral diabetes medicines. Hypoglycemia can be a life-threatening condition. Symptoms of hypoglycemia (sleepiness, dizziness, and confusion) are similar to symptoms of having too much alcohol. If your health care provider says that alcohol is safe for you, follow these guidelines:  Limit alcohol intake to no more than 1 drink per day for nonpregnant women and 2 drinks per day for men. One drink equals 12 oz of beer, 5 oz of wine, or 1 oz of hard liquor.  Do not drink on an empty stomach.  Keep yourself hydrated with water, diet soda, or unsweetened iced tea.  Keep in mind that regular soda, juice, and other mixers may contain a lot of sugar and must be counted as carbs. What are tips for following this plan?  Reading food labels  Start by checking the serving size on the "Nutrition Facts" label of packaged foods and drinks. The amount of calories, carbs, fats, and other nutrients listed on the label is based on one serving of the item. Many items contain more than one serving per package.  Check the total grams (g) of carbs in one serving. You can calculate the number of servings of carbs in one serving by dividing the total carbs by 15.  For example, if a food has 30 g of total carbs, it would be equal to 2 servings of carbs.  Check the number of grams (g) of saturated and trans fats in one serving. Choose foods that have low or no amount of these fats.  Check the number of milligrams (mg) of salt (sodium) in one serving. Most people should limit total sodium intake to less than 2,300 mg per day.  Always check the nutrition information of foods labeled as "low-fat" or "nonfat". These foods may be  higher in added sugar or refined carbs and should be avoided.  Talk to your dietitian to identify your daily goals for nutrients listed on the label. Shopping  Avoid buying canned, premade, or processed foods. These foods tend to be high in fat, sodium, and added sugar.  Shop around the outside edge of the grocery store. This includes fresh fruits and vegetables, bulk grains, fresh meats, and fresh dairy. Cooking  Use low-heat cooking methods, such as baking, instead of high-heat cooking methods like deep frying.  Cook using healthy oils, such as olive, canola, or sunflower oil.  Avoid cooking with butter, cream, or high-fat meats. Meal planning  Eat meals and snacks regularly, preferably at the same times every day. Avoid going long periods of time without eating.  Eat foods high in fiber, such as fresh fruits, vegetables, beans, and whole grains. Talk to your dietitian about how many servings of carbs you can eat at each meal.  Eat 4-6 ounces (oz) of lean protein each day, such as lean meat, chicken, fish, eggs, or tofu. One oz of lean protein is equal to: ? 1 oz of meat, chicken, or fish. ? 1 egg. ?  cup of tofu.  Eat some foods each day that contain healthy fats, such as avocado, nuts, seeds, and fish. Lifestyle  Check your blood glucose regularly.  Exercise regularly as told by your health care provider. This may include: ? 150 minutes of moderate-intensity or vigorous-intensity exercise each week. This could be brisk walking, biking, or water aerobics. ? Stretching and doing strength exercises, such as yoga or weightlifting, at least 2 times a week.  Take medicines as told by your health care provider.  Do not use any products that contain nicotine or tobacco, such as cigarettes and e-cigarettes. If you need help quitting, ask your health care provider.  Work with a Social worker or diabetes educator to identify strategies to manage stress and any emotional and social  challenges. Questions to ask a health care provider  Do I need to meet with a diabetes educator?  Do I need to meet with a dietitian?  What number can I call if I have questions?  When are the best times to check my blood glucose? Where to find more information:  American Diabetes Association: diabetes.org  Academy of Nutrition and Dietetics: www.eatright.[employer] of Diabetes and Digestive and Kidney Diseases (NIH): [employer] Summary  A healthy meal plan will help you control your blood glucose and maintain a healthy lifestyle.  Working with a diet and nutrition specialist (dietitian) can help you make a meal plan that is best for you.  Keep in mind that carbohydrates (carbs) and alcohol have immediate effects on your blood glucose levels. It is important to count carbs and to use alcohol carefully. This information is not intended to replace advice given to you by your health care provider. Make sure you discuss any questions you have with your health care provider. Document  Released: 08/15/2005 Document Revised: 06/18/2017 Document Reviewed: 12/23/2016 Elsevier Interactive Patient Education  2019 [employer].  Diabetes Mellitus and Exercise Exercising regularly is important for your overall health, especially when you have diabetes (diabetes mellitus). Exercising is not only about losing weight. It has many other health benefits, such as increasing muscle strength and bone density and reducing body fat and stress. This leads to improved fitness, flexibility, and endurance, all of which result in better overall health. Exercise has additional benefits for people with diabetes, including:  Reducing appetite.  Helping to lower and control blood glucose.  Lowering blood pressure.  Helping to control amounts of fatty substances (lipids) in the blood, such as cholesterol and triglycerides.  Helping the body to respond better to insulin (improving insulin  sensitivity).  Reducing how much insulin the body needs.  Decreasing the risk for heart disease by: ? Lowering cholesterol and triglyceride levels. ? Increasing the levels of good cholesterol. ? Lowering blood glucose levels. What is my activity plan? Your health care provider or certified diabetes educator can help you make a plan for the type and frequency of exercise (activity plan) that works for you. Make sure that you:  Do at least 150 minutes of moderate-intensity or vigorous-intensity exercise each week. This could be brisk walking, biking, or water aerobics. ? Do stretching and strength exercises, such as yoga or weightlifting, at least 2 times a week. ? Spread out your activity over at least 3 days of the week.  Get some form of physical activity every day. ? Do not go more than 2 days in a row without some kind of physical activity. ? Avoid being inactive for more than 30 minutes at a time. Take frequent breaks to walk or stretch.  Choose a type of exercise or activity that you enjoy, and set realistic goals.  Start slowly, and gradually increase the intensity of your exercise over time. What do I need to know about managing my diabetes?   Check your blood glucose before and after exercising. ? If your blood glucose is 240 mg/dL (13.3 mmol/L) or higher before you exercise, check your urine for ketones. If you have ketones in your urine, do not exercise until your blood glucose returns to normal. ? If your blood glucose is 100 mg/dL (5.6 mmol/L) or lower, eat a snack containing 15-20 grams of carbohydrate. Check your blood glucose 15 minutes after the snack to make sure that your level is above 100 mg/dL (5.6 mmol/L) before you start your exercise.  Know the symptoms of low blood glucose (hypoglycemia) and how to treat it. Your risk for hypoglycemia increases during and after exercise. Common symptoms of hypoglycemia can include: ? Hunger. ? Anxiety. ? Sweating and feeling  clammy. ? Confusion. ? Dizziness or feeling light-headed. ? Increased heart rate or palpitations. ? Blurry vision. ? Tingling or numbness around the mouth, lips, or tongue. ? Tremors or shakes. ? Irritability.  Keep a rapid-acting carbohydrate snack available before, during, and after exercise to help prevent or treat hypoglycemia.  Avoid injecting insulin into areas of the body that are going to be exercised. For example, avoid injecting insulin into: ? The arms, when playing tennis. ? The legs, when jogging.  Keep records of your exercise habits. Doing this can help you and your health care provider adjust your diabetes management plan as needed. Write down: ? Food that you eat before and after you exercise. ? Blood glucose levels before and after you exercise. ? The type  and amount of exercise you have done. ? When your insulin is expected to peak, if you use insulin. Avoid exercising at times when your insulin is peaking.  When you start a new exercise or activity, work with your health care provider to make sure the activity is safe for you, and to adjust your insulin, medicines, or food intake as needed.  Drink plenty of water while you exercise to prevent dehydration or heat stroke. Drink enough fluid to keep your urine clear or pale yellow. Summary  Exercising regularly is important for your overall health, especially when you have diabetes (diabetes mellitus).  Exercising has many health benefits, such as increasing muscle strength and bone density and reducing body fat and stress.  Your health care provider or certified diabetes educator can help you make a plan for the type and frequency of exercise (activity plan) that works for you.  When you start a new exercise or activity, work with your health care provider to make sure the activity is safe for you, and to adjust your insulin, medicines, or food intake as needed. This information is not intended to replace advice  given to you by your health care provider. Make sure you discuss any questions you have with your health care provider. Document Released: 02/08/2004 Document Revised: 05/29/2017 Document Reviewed: 04/29/2016 Elsevier Interactive Patient Education  2019 [employer].

## 2019-01-14 ENCOUNTER — Encounter: Payer: Self-pay | Admitting: Internal Medicine

## 2019-01-25 ENCOUNTER — Ambulatory Visit: Payer: Self-pay | Admitting: *Deleted

## 2019-01-26 LAB — CUP PACEART REMOTE DEVICE CHECK
Date Time Interrogation Session: 20200223124214
Implantable Pulse Generator Implant Date: 20180427

## 2019-01-27 MED FILL — AMLODIPINE BESYLATE 10 MG T: 10 | 30 days supply | Qty: 30 | Fill #5

## 2019-02-01 ENCOUNTER — Ambulatory Visit (INDEPENDENT_AMBULATORY_CARE_PROVIDER_SITE_OTHER): Payer: Medicare Other | Admitting: Internal Medicine

## 2019-02-01 ENCOUNTER — Encounter: Payer: Self-pay | Admitting: Internal Medicine

## 2019-02-01 VITALS — BP 140/68 | HR 69 | Ht 60.0 in | Wt 115.2 lb

## 2019-02-01 DIAGNOSIS — I639 Cerebral infarction, unspecified: Secondary | ICD-10-CM

## 2019-02-01 HISTORY — PX: OTHER SURGICAL HISTORY: SHX169

## 2019-02-01 MED FILL — ATORVASTATIN CALCIUM 40 MG: 40 | 30 days supply | Qty: 30 | Fill #5

## 2019-02-01 NOTE — Progress Notes (Signed)
PCP: Ladell Pier, MD   Primary EP: Amy Howell is a 63 y.o. female who presents today for routine electrophysiology followup.  Since last being seen in our clinic, the patient reports doing very well.  Today, she denies symptoms of palpitations, chest pain, shortness of breath,  lower extremity edema, dizziness, presyncope, or syncope.  The patient is otherwise without complaint today.   Past Medical History:  Diagnosis Date  . Cataracts, bilateral   . Cerebral infarction due to carotid artery stenosis ([place]   . Glaucoma   . Headache   . Hypertension   . Stroke [facility])    no residual, "series of mini strokes"  . Vertigo    Past Surgical History:  Procedure Laterality Date  . ABDOMINAL HYSTERECTOMY    . CARDIAC DEFIBRILLATOR PLACEMENT    . gum operation    . IR 3D INDEPENDENT WKST  05/07/2017  . IR ANGIO INTRA EXTRACRAN SEL COM CAROTID INNOMINATE BILAT MOD SED  04/23/2017  . IR ANGIO INTRA EXTRACRAN SEL COM CAROTID INNOMINATE BILAT MOD SED  08/06/2017  . IR ANGIO INTRA EXTRACRAN SEL COM CAROTID INNOMINATE UNI R MOD SED  05/07/2017  . IR ANGIO VERTEBRAL SEL VERTEBRAL BILAT MOD SED  04/23/2017  . IR ANGIO VERTEBRAL SEL VERTEBRAL BILAT MOD SED  08/06/2017  . IR ANGIOGRAM FOLLOW UP STUDY  05/07/2017  . IR ANGIOGRAM SELECTIVE EACH ADDITIONAL VESSEL  05/07/2017  . IR RADIOLOGIST EVAL & MGMT  04/16/2017  . IR RADIOLOGIST EVAL & MGMT  05/29/2017  . IR TRANSCATH/EMBOLIZ  05/07/2017  . LOOP RECORDER INSERTION N/A 03/28/2017   Procedure: Loop Recorder Insertion;  Surgeon: Thompson Grayer, MD;  Location: [place] CV LAB;  Service: Cardiovascular;  Laterality: N/A;  . RADIOLOGY WITH ANESTHESIA N/A 05/07/2017   Procedure: EMBOLIZATION;  Surgeon: Luanne Bras, MD;  Location: [place];  Service: Radiology;  Laterality: N/A;  . RADIOLOGY WITH ANESTHESIA N/A 08/06/2017   Procedure: RADIOLOGY WITH ANESTHESIA STENTING;  Surgeon: Luanne Bras, MD;  Location: [place];  Service: Radiology;   Laterality: N/A;  . TEE WITHOUT CARDIOVERSION N/A 03/28/2017   Procedure: TRANSESOPHAGEAL ECHOCARDIOGRAM (TEE);  Surgeon: Lelon Perla, MD;  Location: [facility] ENDOSCOPY;  Service: Cardiovascular;  Laterality: N/A;    ROS- all systems are reviewed and negatives except as per HPI above  Current Outpatient Medications  Medication Sig Dispense Refill  . acetaminophen (TYLENOL) 500 MG tablet Take 1,000 mg by mouth daily as needed (for pain.).     Marland Kitchen amLODipine (NORVASC) 10 MG tablet Take 1 tablet (10 mg total) by mouth daily. 90 tablet 1  . aspirin 325 MG tablet Take 1 tablet (325 mg total) by mouth daily. 90 tablet 3  . atorvastatin (LIPITOR) 40 MG tablet Take 1 tablet (40 mg total) by mouth daily at 6 PM. 90 tablet 1  . feeding supplement, ENSURE ENLIVE, (ENSURE ENLIVE) LIQD Take 237 mLs by mouth 2 (two) times daily.     Marland Kitchen latanoprost (XALATAN) 0.005 % ophthalmic solution Place 1 drop into both eyes at bedtime.   11  . lisinopril (PRINIVIL,ZESTRIL) 5 MG tablet Take 1 tablet (5 mg total) by mouth daily. 90 tablet 3  . Tdap (BOOSTRIX) 5-2.5-18.5 LF-MCG/0.5 injection Inject 0.5 mLs into the muscle as directed. (Patient not taking: Reported on 01/08/2019) 0.5 mL 0  . ticagrelor (BRILINTA) 90 MG TABS tablet Take 0.5 tablets (45 mg total) by mouth 2 (two) times daily. 90 tablet 1  . timolol (TIMOPTIC) 0.5 %  ophthalmic solution INSTILL 1 DROP INTO LEFT EYE EVERY MORNING  99   No current facility-administered medications for this visit.     Physical Exam: Vitals:   02/01/19 1037  BP: 140/68  Pulse: 69  SpO2: 99%  Weight: 115 lb 3.2 oz (52.3 kg)  Height: 5' (1.524 m)    GEN- The patient is well appearing, alert and oriented x 3 today.   Head- normocephalic, atraumatic Eyes-  Sclera clear, conjunctiva pink Ears- hearing intact Oropharynx- clear Lungs- Clear to ausculation bilaterally, normal work of breathing Heart- Regular rate and rhythm, no murmurs, rubs or gallops, PMI not laterally  displaced GI- soft, NT, ND, + BS Extremities- no clubbing, cyanosis, or edema  Wt Readings from Last 3 Encounters:  02/01/19 115 lb 3.2 oz (52.3 kg)  01/08/19 116 lb (52.6 kg)  08/20/18 115 lb 9.6 oz (52.4 kg)    ILR interrogation is reviewed at length and reveals no afib  Assessment and Plan:  1. Cryptogenic stroke No afib by ILR interrogation She reports discomfort over the device site.  She is very clear that she wishes to have this removed today.  Risks and benefits to ILR removal were discussed at length with the patient who wishes to proceed.   Thompson Grayer MD, [facility] 02/01/2019 11:02 AM      PROCEDURES:   1. Implantable loop recorder explantation        DESCRIPTION OF PROCEDURE:  Informed written consent was obtained.  The patient required no sedation for the procedure today.   The patients left chest was therefore prepped and draped in the usual sterile fashion.  The skin overlying the ILR monitor was infiltrated with lidocaine for local analgesia.  A 0.5-cm incision was made over the site.  The previously implanted ILR was exposed and removed using a combination of sharp and blunt dissection.  Steri- Strips and a sterile dressing were then applied. EBL<32ml.  There were no early apparent complications.     CONCLUSIONS:   1. Successful explantation of a [employer] Reveal LINQ implantable loop recorder   2. No early apparent complications.        Thompson Grayer MD, [facility] 02/01/2019 11:29 AM

## 2019-02-01 NOTE — Patient Instructions (Addendum)
Medication Instructions:  Your physician recommends that you continue on your current medications as directed. Please refer to the Current Medication list given to you today.  Labwork: None ordered.  Testing/Procedures: None ordered.  Follow-Up:  Call our office if you have any concerns about your healing wound.  [phone] Baller RN  Your physician wants you to follow-up in: as needed with Dr. Rayann Heman.      Implantable Loop Recorder Placement, Care After Refer to this sheet in the next few weeks. These instructions provide you with information about caring for yourself after your procedure. Your health care provider may also give you more specific instructions. Your treatment has been planned according to current medical practices, but problems sometimes occur. Call your health care provider if you have any problems or questions after your procedure. What can I expect after the procedure? After the procedure, it is common to have:  Soreness or pain near the cut from surgery (incision).  Some swelling or bruising near the incision.  Follow these instructions at home:  Medicines  Take over-the-counter and prescription medicines only as told by your health care provider.  If you were prescribed an antibiotic medicine, take it as told by your health care provider. Do not stop taking the antibiotic even if you start to feel better.  Bathing Do not take baths, swim, or use a hot tub until your health care provider approves. You may shower 24 hours after removal of your monitor.  Incision care  Follow instructions from your health care provider about how to take care of your incision. Make sure you: ? Remove your top dressing after 24 hours (before you shower) ? Leave stitches (sutures), skin glue, or adhesive strips in place. These skin closures may need to stay in place for 2 weeks or longer. If adhesive strip edges start to loosen and curl up, you may trim the loose edges.  Do not remove adhesive strips completely unless your health care provider tells you to do that.  Check your incision area every day for signs of infection. Check for: ? More redness, swelling, or pain. ? Fluid or blood. ? Warmth. ? Pus or a bad smell.  Contact a health care provider if:  You have more redness, swelling, or pain around your incision.  You have more fluid or blood coming from your incision.  Your incision feels warm to the touch.  You have pus or a bad smell coming from your incision.  You have a fever.  You have pain that is not relieved by your pain medicine.  You have triggered your device because of fainting (syncope) or because of a heartbeat that feels like it is racing, slow, fluttering, or skipping (palpitations).

## 2019-02-09 ENCOUNTER — Encounter: Payer: Self-pay | Admitting: Internal Medicine

## 2019-02-27 ENCOUNTER — Other Ambulatory Visit: Payer: Self-pay | Admitting: Internal Medicine

## 2019-02-27 DIAGNOSIS — I10 Essential (primary) hypertension: Secondary | ICD-10-CM

## 2019-02-27 DIAGNOSIS — E782 Mixed hyperlipidemia: Secondary | ICD-10-CM

## 2019-03-01 MED FILL — AMLODIPINE BESYLATE 10 MG T: 10 | 90 days supply | Qty: 90 | Fill #0

## 2019-03-01 MED FILL — ATORVASTATIN CALCIUM 40 MG: 40 | 90 days supply | Qty: 90 | Fill #0

## 2019-04-13 IMAGING — MR MR HEAD WO/W CM
9 of 11 series · 33 of 48 positions shown · IV contrast (Yes   MULTIHANCE)
Comparison: CTA head neck from yesterday

CLINICAL DATA: Stroke.  Right-sided headaches.

EXAM:
MRI HEAD WITHOUT AND WITH CONTRAST
TECHNIQUE: Multiplanar, multiecho pulse sequences of the brain and surrounding
structures were obtained without and with intravenous contrast.
CONTRAST:  10mL MULTIHANCE GADOBENATE DIMEGLUMINE 529 MG/ML IV SOLN

[Series 3: T1 · sagittal · 5.0mm · 0.47mm/px · 3 of 24 slices shown]
[im 1/24]
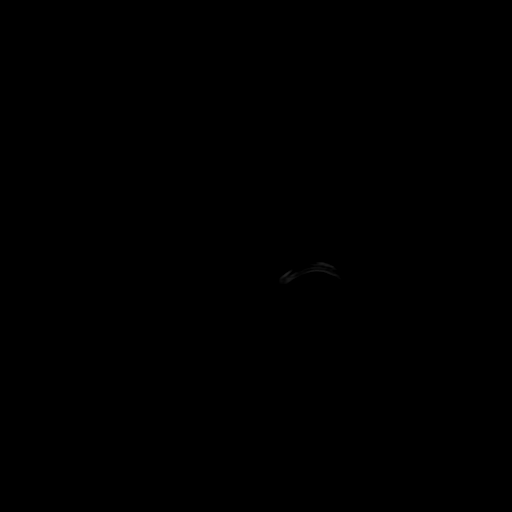
[im 12/24]
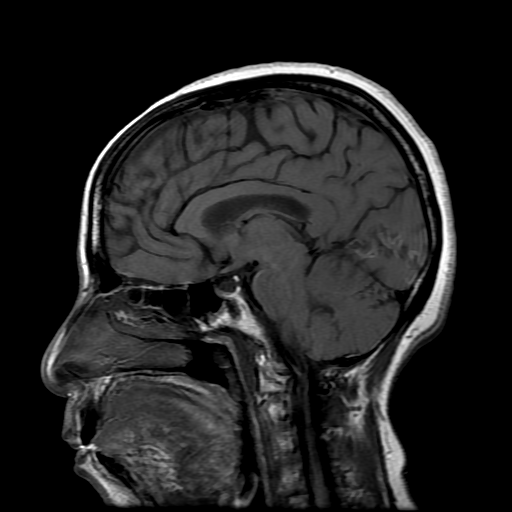
[im 24/24]
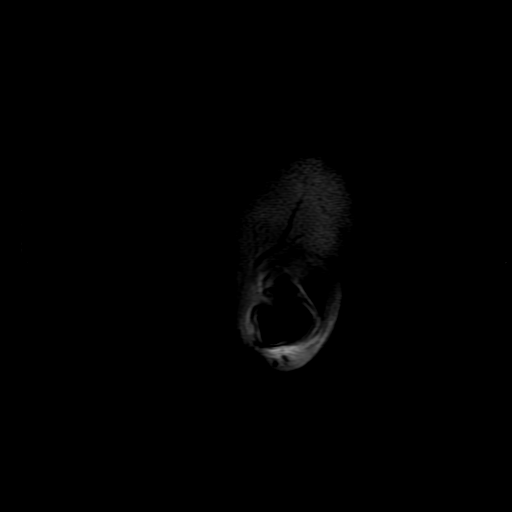

[Series 4: DWI · axial · 3.0mm · 1.09mm/px · z∈[-63,+81]mm · 9 of 98 slices shown (1 of 4)]
[im 1/98]
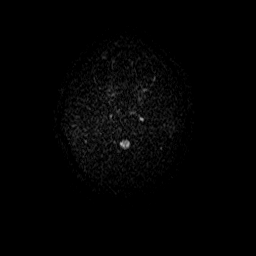
[im 13/98]
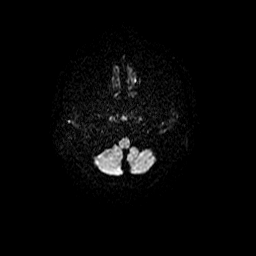
[im 25/98]
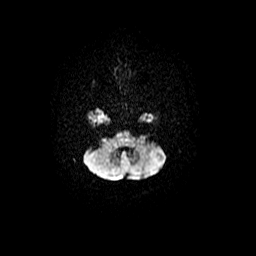
[im 37/98]
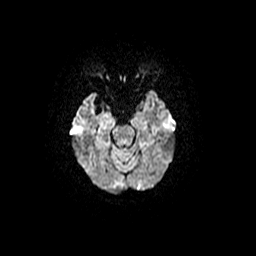
[im 49/98]
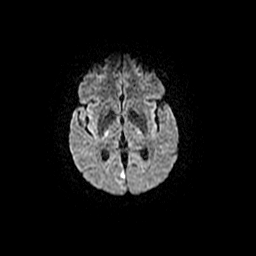
[im 61/98]
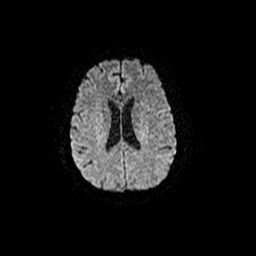
[im 73/98]
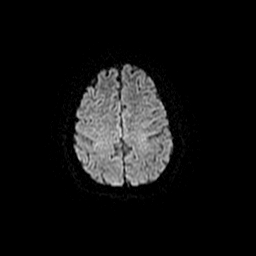
[im 85/98]
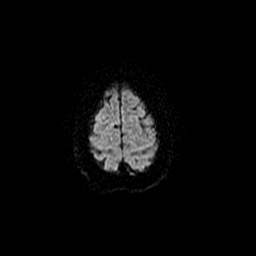
[im 98/98]
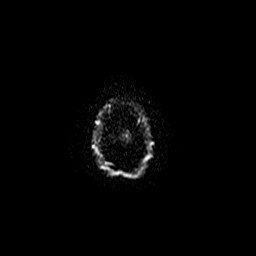

[Series 5: T2 · axial · 5.0mm · 0.55mm/px · 1 of 13 slices shown]
[im 1/13]
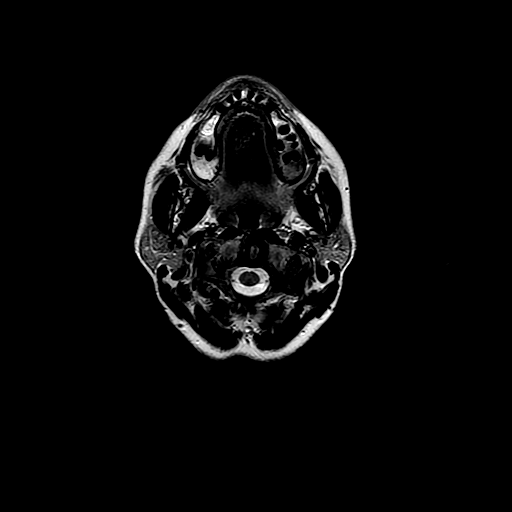

[Series 6: DWI · coronal · 5.0mm · 1.09mm/px · 6 of 66 slices shown (2 of 4)]
[im 1/66]
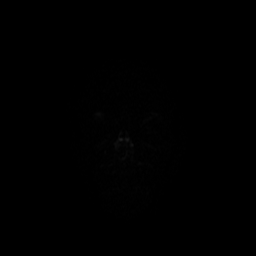
[im 14/66]
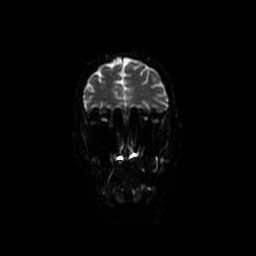
[im 27/66]
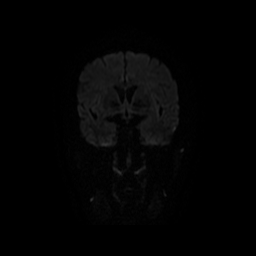
[im 40/66]
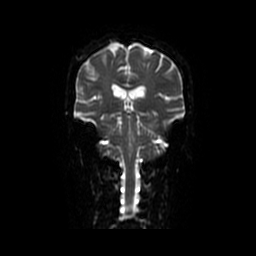
[im 53/66]
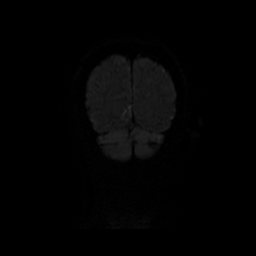
[im 66/66]
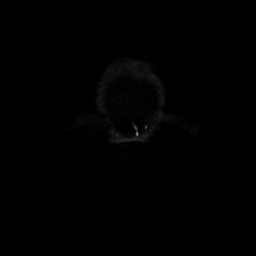

[Series 7: FLAIR · axial · 5.0mm · 0.55mm/px · z∈[-61,+79]mm · 2 of 21 slices shown]
[im 1/21]
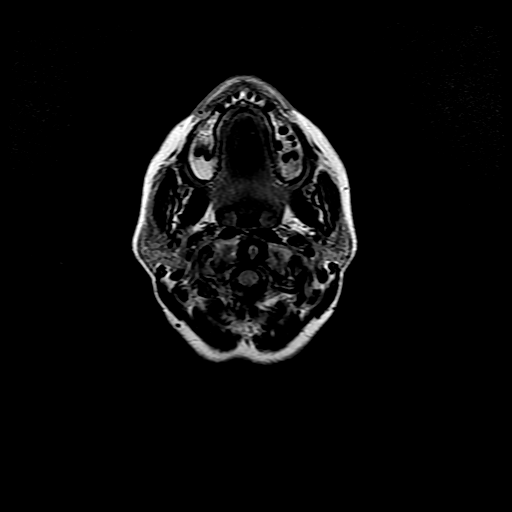
[im 21/21]
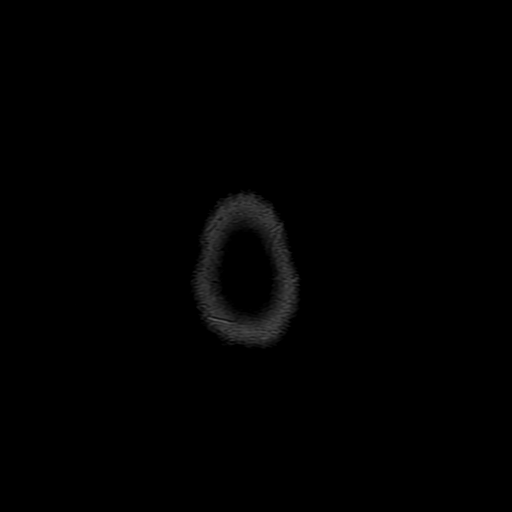

[Series 8: ax mpgr · axial · 5.0mm · 0.55mm/px · z∈[-61,+79]mm · 2 of 21 slices shown]
[im 1/21]
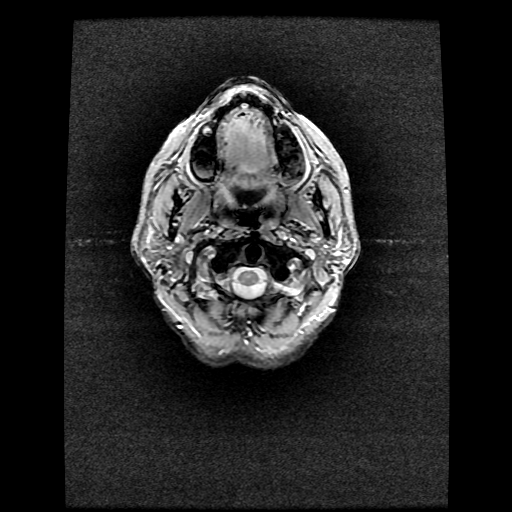
[im 21/21]
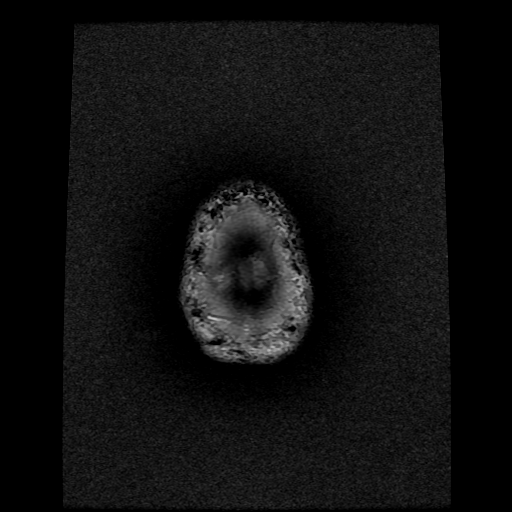

[Series 10: T2 post-contrast · coronal · 5.0mm · 0.55mm/px · 2 of 25 slices shown]
[im 1/25]
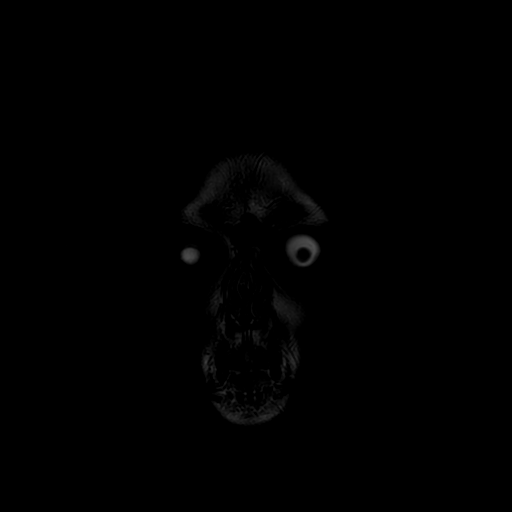
[im 25/25]
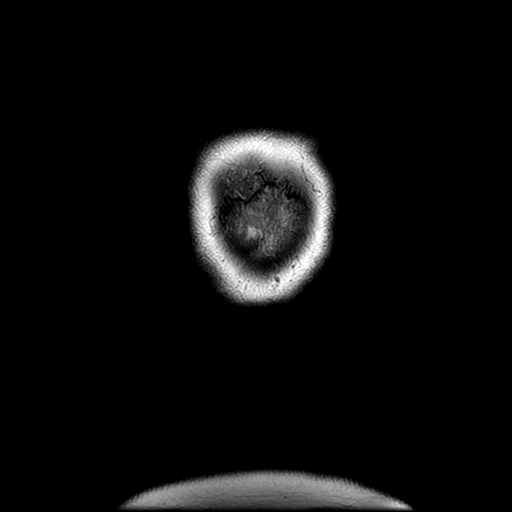

[Series 400: DWI · axial · 3.0mm · 1.09mm/px · z∈[-63,+81]mm · 5 of 49 slices shown (3 of 4)]
[im 1/49]
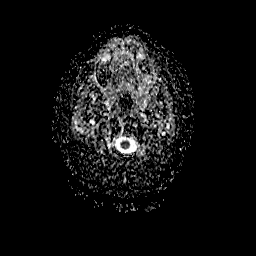
[im 13/49]
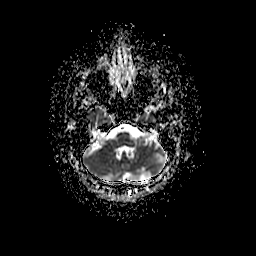
[im 25/49]
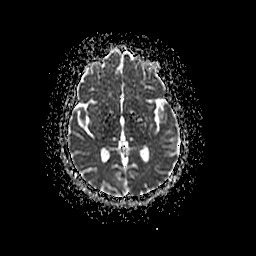
[im 37/49]
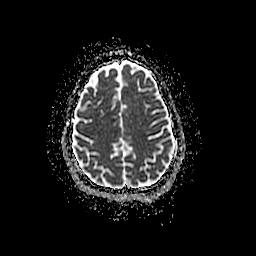
[im 49/49]
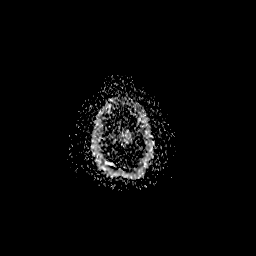

[Series 600: DWI · coronal · 5.0mm · 1.09mm/px · 3 of 33 slices shown (4 of 4)]
[im 1/33]
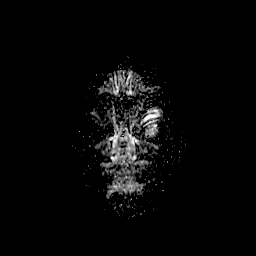
[im 17/33]
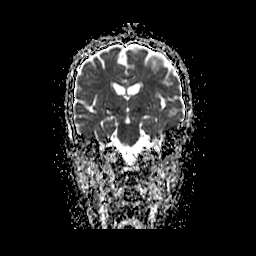
[im 33/33]
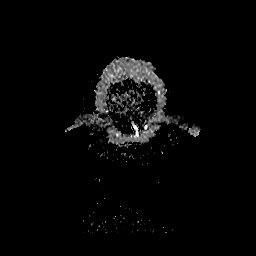

[33 of 48 positions shown; findings below may reference images not displayed]

FINDINGS: Brain: Right more than left occipital pole signal abnormality which
is T2/FLAIR hyperintense and serpiginously enhancing. There could be
small superimposed infarcts, with ADC map difficult to interpret in
this setting. Remote small infarcts in the bilateral cerebellum.
Patient has proximal basilar stenosis at an irregular plaque by CTA.
There is T1 hyperintensity along the cortex of the right occipital
infarct, with minimal gradient signal hypointensity, likely cortical
laminar necrosis. Remote lacunar infarcts in thalami.

No acute hemorrhage, hydrocephalus, or mass.

Scattered FLAIR hyperintensities in the cerebral white matter,
nonspecific but likely microvascular ischemic.

Vascular: Recent CTA positive for right MCA aneurysms and basilar
stenosis.

Skull and upper cervical spine: No marrow signal abnormality.

Sinuses/Orbits: Negative
IMPRESSION: 1. Subacute infarcts in the bilateral occipital poles. Small more
acute infarcts may be superimposed. Patient has other small remote
posterior circulation infarcts. Known basilar stenosis at an
irregular plaque by CTA yesterday.
2. Known cerebral aneurysms.

## 2019-04-16 ENCOUNTER — Ambulatory Visit: Payer: 59 | Admitting: Internal Medicine

## 2019-06-01 MED FILL — BRILINTA 90 MG TABLET: 90 | 30 days supply | Qty: 30 | Fill #3

## 2019-06-23 ENCOUNTER — Other Ambulatory Visit: Payer: Self-pay | Admitting: Internal Medicine

## 2019-06-23 DIAGNOSIS — E782 Mixed hyperlipidemia: Secondary | ICD-10-CM

## 2019-06-23 DIAGNOSIS — I10 Essential (primary) hypertension: Secondary | ICD-10-CM

## 2019-06-23 MED FILL — AMLODIPINE BESYLATE 10 MG T: 10 | 30 days supply | Qty: 30 | Fill #0

## 2019-06-23 MED FILL — BRIMONIDINE 0.2% EYE DROP: 0.2 | 38 days supply | Qty: 10 | Fill #1

## 2019-06-23 MED FILL — DORZOLAMIDE HCL-TIMOLOL MAL: 22.3-6.8 | 38 days supply | Qty: 10 | Fill #1

## 2019-06-23 MED FILL — ATORVASTATIN CALCIUM 40 MG: 40 | 30 days supply | Qty: 30 | Fill #0

## 2019-06-23 MED FILL — BRILINTA 90 MG TABLET: 90 | 30 days supply | Qty: 30 | Fill #3

## 2019-07-15 ENCOUNTER — Other Ambulatory Visit: Payer: Self-pay

## 2019-07-15 ENCOUNTER — Encounter: Payer: Self-pay | Admitting: Internal Medicine

## 2019-07-15 ENCOUNTER — Ambulatory Visit: Payer: Medicare Other | Attending: Internal Medicine | Admitting: Internal Medicine

## 2019-07-15 VITALS — BP 180/84 | HR 60 | Temp 98.2°F | Resp 16 | Ht 60.0 in | Wt 114.4 lb

## 2019-07-15 DIAGNOSIS — E119 Type 2 diabetes mellitus without complications: Secondary | ICD-10-CM

## 2019-07-15 DIAGNOSIS — Z8673 Personal history of transient ischemic attack (TIA), and cerebral infarction without residual deficits: Secondary | ICD-10-CM

## 2019-07-15 DIAGNOSIS — Z5320 Procedure and treatment not carried out because of patient's decision for unspecified reasons: Secondary | ICD-10-CM | POA: Insufficient documentation

## 2019-07-15 DIAGNOSIS — I10 Essential (primary) hypertension: Secondary | ICD-10-CM

## 2019-07-15 DIAGNOSIS — E782 Mixed hyperlipidemia: Secondary | ICD-10-CM

## 2019-07-15 LAB — GLUCOSE, POCT (MANUAL RESULT ENTRY): POC Glucose: 101 mg/dl — AB (ref 70–99)

## 2019-07-15 LAB — POCT GLYCOSYLATED HEMOGLOBIN (HGB A1C): HbA1c, POC (prediabetic range): 6.4 % (ref 5.7–6.4)

## 2019-07-15 MED ORDER — TICAGRELOR 90 MG PO TABS: 45.0000 mg | ORAL_TABLET | Freq: Two times a day (BID) | ORAL | 3 refills | Status: DC

## 2019-07-15 MED ORDER — ATORVASTATIN CALCIUM 40 MG PO TABS: 40.0000 mg | ORAL_TABLET | Freq: Every day | ORAL | 3 refills | Status: DC

## 2019-07-15 MED ORDER — LISINOPRIL 5 MG PO TABS: 5.0000 mg | ORAL_TABLET | Freq: Every day | ORAL | 3 refills | Status: DC

## 2019-07-15 MED ORDER — AMLODIPINE BESYLATE 10 MG PO TABS: 10.0000 mg | ORAL_TABLET | Freq: Every day | ORAL | 3 refills | Status: DC

## 2019-07-15 MED FILL — ATORVASTATIN CALCIUM 40 MG: 40 | 30 days supply | Qty: 30 | Fill #0

## 2019-07-15 MED FILL — AMLODIPINE BESYLATE 10 MG T: 10 | 30 days supply | Qty: 30 | Fill #0

## 2019-07-15 MED FILL — LISINOPRIL 5 MG TAB: 5 | 30 days supply | Qty: 30 | Fill #0

## 2019-07-15 MED FILL — BRILINTA 90 MG TABLET: 90 | 30 days supply | Qty: 30 | Fill #0

## 2019-07-15 NOTE — Patient Instructions (Addendum)
Please give patient an appointment with the clinical pharmacist in 2 weeks for repeat blood pressure check.  Your blood pressure is not controlled.  Continue amlodipine 10 mg daily.  We have added a new blood pressure medication called lisinopril 5 mg once a day.  It is very important that your blood pressure be controlled. Please check your blood pressure at least twice a week and record the readings.  The goal is 130/80 or lower.  We will set up an appointment for you to meet with the clinical pharmacist in 2 weeks for repeat blood pressure check.

## 2019-07-15 NOTE — Progress Notes (Signed)
Patient ID: Amy Howell, female    DOB: [birth year]  MRN: [id number]  CC: Hypertension and Diabetes (prediabetes )   Subjective: Amy Howell is a 63 y.o. 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 Estanislado Pandy), pre-DM, glaucoma  Last seen 01/2019  HYPERTENSION Currently taking: see medication list-she is supposed to be on amlodipine and lisinopril.  Lisinopril was added on last visit because blood pressure was not at goal.  However she never got the lisinopril stating that her daughter never picked it up. Med Adherence: [x]  Yes -took amlodipine already for the morning Medication side effects: []  Yes    [x]  No Adherence with salt restriction: [x]  Yes    []  No Home Monitoring?: [x]  Yes   Monitoring Frequency: daily in mornings.  She forgot to bring log.   Home BP results range: []  Yes    []  No SOB? []  Yes    [x]  No Chest Pain?: []  Yes    [x]  No Leg swelling?: []  Yes    [x]  No Headaches?: []  Yes    [x]  No Dizziness? []  Yes    [x]  No Comments:   DIABETES TYPE 2 Last A1C:   Results for orders placed or performed in visit on 07/15/19  POCT glucose (manual entry)  Result Value Ref Range   POC Glucose 101 (A) 70 - 99 mg/dl  POCT glycosylated hemoglobin (Hb A1C)  Result Value Ref Range   Hemoglobin A1C     HbA1c POC (<> result, manual entry)     HbA1c, POC (prediabetic range) 6.4 5.7 - 6.4 %   HbA1c, POC (controlled diabetic range)      Med Adherence:  Decline med on last visit.  Wanted to work on improving her eating habits Medication side effects:   Home Monitoring?  Has monitor but not checking Home glucose results range: Diet Adherence: [x]  Yes - "I cut out those sweet rolls." Exercise:  Not as much as she likes because some residents in her complex walk their  dogs without leash.  Does some streaching exercise in her apartment and walks to take out her trash several times a week.  She states this is a good distance from her apartment to the trash area for the apartment complex Hypoglycemic episodes?: NA Numbness of the feet? []  Yes    [x]  No Retinopathy hx? []  Yes    [x]  No Last eye exam:  01/2019 Comments:   CVA/CAS RT:  Had loop recorder taken out since last visit.  Compliant with taking aspirin, Brilinta, atorvastatin and amlodipine.  HM:  Still wants to hold on MMG and c-scope.  Referred for colonoscopy on last visit.  [facility] did call her for an appointment but she never got back with them.  Patient Active Problem List   Diagnosis Date Noted  . Colon cancer screening declined 07/15/2019  . Mammogram declined 07/15/2019  . New onset type 2 diabetes mellitus ([place]) 01/08/2019  . Tobacco dependence 03/30/2018  . Glaucoma of both eyes 03/30/2018  . History of loop recorder 03/30/2018  . Prediabetes 04/10/2017  . Cryptogenic stroke ([place])   . Aneurysm of middle cerebral artery   . Weight loss, unintentional   . Basilar artery stenosis   . Essential hypertension 03/26/2017     Current Outpatient Medications on File Prior to Visit  Medication Sig Dispense Refill  . acetaminophen (TYLENOL) 500 MG tablet Take 1,000 mg by mouth daily as needed (for pain.).     Marland Kitchen aspirin 325 MG tablet Take 1 tablet (325 mg total) by mouth daily. 90 tablet 3  . brimonidine (ALPHAGAN) 0.2 % ophthalmic solution     . dorzolamide-timolol (COSOPT) 22.3-6.8 MG/ML ophthalmic solution     . feeding supplement, ENSURE ENLIVE, (ENSURE ENLIVE) LIQD Take 237 mLs by mouth 2 (two) times daily.     Marland Kitchen latanoprost (XALATAN) 0.005 % ophthalmic solution Place 1 drop into both eyes at bedtime.   11  . timolol (TIMOPTIC) 0.5 % ophthalmic solution INSTILL 1 DROP INTO LEFT EYE EVERY MORNING  99   No current facility-administered medications on file prior to visit.     No Known  Allergies  Social History   Socioeconomic History  . Marital status: Single    Spouse name: Not on file  . Number of children: Not on file  . Years of education: Not on file  . Highest education level: Not on file  Occupational History  . Not on file  Social Needs  . Financial resource strain: Not on file  . Food insecurity    Worry: Not on file    Inability: Not on file  . Transportation needs    Medical: Not on file    Non-medical: Not on file  Tobacco Use  . Smoking status: Current Every Day Smoker    Packs/day: 0.25    Types: Cigarettes  . Smokeless tobacco: Never Used  . Tobacco comment: 1 cigarette a day   Substance and Sexual Activity  . Alcohol use: No  . Drug use: No  . Sexual activity: Not on file  Lifestyle  . Physical activity    Days per week: Not on file    Minutes per session: Not on file  . Stress: Not on file  Relationships  . Social Herbalist on phone: Not on file    Gets together: Not on file    Attends religious service: Not on file    Active member of club or organization: Not on file    Attends meetings of clubs or organizations: Not on file    Relationship status: Not on file  . Intimate partner violence    Fear of current or ex partner: Not on file    Emotionally abused: Not on file    Physically abused: Not on file    Forced sexual activity: Not on file  Other Topics Concern  . Not on file  Social History Narrative  . Not on file    Family History  Problem Relation Age of Onset  . Hypertension Father   . Diabetes Father     Past Surgical History:  Procedure Laterality Date  . ABDOMINAL HYSTERECTOMY    . CARDIAC DEFIBRILLATOR PLACEMENT    . gum operation    . Implantable loop recorder removal  02/01/2019   MDT Reveal LINQ explantation in office by JA  . IR 3D INDEPENDENT WKST  05/07/2017  . IR ANGIO INTRA EXTRACRAN SEL COM CAROTID INNOMINATE BILAT MOD SED  04/23/2017  . IR ANGIO INTRA EXTRACRAN SEL COM CAROTID  INNOMINATE BILAT MOD SED  08/06/2017  . IR ANGIO INTRA EXTRACRAN SEL COM CAROTID INNOMINATE UNI R MOD SED  05/07/2017  . IR ANGIO VERTEBRAL SEL VERTEBRAL BILAT MOD SED  04/23/2017  . IR ANGIO VERTEBRAL SEL VERTEBRAL BILAT MOD SED  08/06/2017  .  IR ANGIOGRAM FOLLOW UP STUDY  05/07/2017  . IR ANGIOGRAM SELECTIVE EACH ADDITIONAL VESSEL  05/07/2017  . IR RADIOLOGIST EVAL & MGMT  04/16/2017  . IR RADIOLOGIST EVAL & MGMT  05/29/2017  . IR TRANSCATH/EMBOLIZ  05/07/2017  . LOOP RECORDER INSERTION N/A 03/28/2017   Procedure: Loop Recorder Insertion;  Surgeon: Thompson Grayer, MD;  Location: [place] [facility] LAB;  Service: Cardiovascular;  Laterality: N/A;  . RADIOLOGY WITH ANESTHESIA N/A 05/07/2017   Procedure: EMBOLIZATION;  Surgeon: Luanne Bras, MD;  Location: [place];  Service: Radiology;  Laterality: N/A;  . RADIOLOGY WITH ANESTHESIA N/A 08/06/2017   Procedure: RADIOLOGY WITH ANESTHESIA STENTING;  Surgeon: Luanne Bras, MD;  Location: [place];  Service: Radiology;  Laterality: N/A;  . TEE WITHOUT CARDIOVERSION N/A 03/28/2017   Procedure: TRANSESOPHAGEAL ECHOCARDIOGRAM (TEE);  Surgeon: Lelon Perla, MD;  Location: [facility] ENDOSCOPY;  Service: Cardiovascular;  Laterality: N/A;    ROS: Review of Systems Negative except as stated above  PHYSICAL EXAM: BP (!) 180/84   Pulse 60   Temp 98.2 F (36.8 C) (Oral)   Resp 16   Ht 5' (1.524 m)   Wt 114 lb 6.4 oz (51.9 kg)   SpO2 99%   BMI 22.34 kg/m   BP 179/84 Physical Exam General appearance - alert, well appearing, and in no distress Mental status - normal mood, behavior, speech, dress, motor activity, and thought processes Eyes - pupils equal and reactive, extraocular eye movements intact Neck - supple, no significant adenopathy Chest - clear to auscultation, no wheezes, rales or rhonchi, symmetric air entry Heart - normal rate, regular rhythm, normal S1, S2, no murmurs, rubs, clicks or gallops Extremities - peripheral pulses normal, no pedal edema, no  clubbing or cyanosis Diabetic Foot Exam - Simple   Simple Foot Form Visual Inspection No deformities, no ulcerations, no other skin breakdown bilaterally: Yes Sensation Testing Intact to touch and monofilament testing bilaterally: Yes Pulse Check Posterior Tibialis and Dorsalis pulse intact bilaterally: Yes Comments      CMP Latest Ref Rng & Units 08/20/2018 12/29/2017 08/01/2017  Glucose 65 - 99 mg/dL 119(H) - 78  BUN 8 - 27 mg/dL 15 - 20  Creatinine 0.57 - 1.00 mg/dL 0.95 - 0.83  Sodium 134 - 144 mmol/L 140 - 139  Potassium 3.5 - 5.2 mmol/L 4.0 - 4.0  Chloride 96 - 106 mmol/L 105 - 106  CO2 20 - 29 mmol/L 22 - 25  Calcium 8.7 - 10.3 mg/dL 9.5 - 9.4  Total Protein 6.0 - 8.5 g/dL - 7.7 -  Total Bilirubin 0.0 - 1.2 mg/dL - 0.2 -  Alkaline Phos 39 - 117 IU/L - 115 -  AST 0 - 40 IU/L - 17 -  ALT 0 - 32 IU/L - 18 -   Lipid Panel     Component Value Date/Time   CHOL 122 12/29/2017 0937   TRIG 52 12/29/2017 0937   HDL 46 12/29/2017 0937   CHOLHDL 2.7 12/29/2017 0937   CHOLHDL 4.6 03/27/2017 1056   VLDL 17 03/27/2017 1056   LDLCALC 66 12/29/2017 0937    CBC    Component Value Date/Time   WBC 7.0 08/20/2018 1058   WBC 6.2 08/01/2017 1134   RBC 4.06 08/20/2018 1058   RBC 4.18 08/01/2017 1134   HGB 11.8 08/20/2018 1058   HCT 36.9 08/20/2018 1058   PLT 167 08/20/2018 1058   MCV 91 08/20/2018 1058   MCH 29.1 08/20/2018 1058   MCH 28.9 08/01/2017 1134  MCHC 32.0 08/20/2018 1058   MCHC 31.1 08/01/2017 1134   RDW 12.8 08/20/2018 1058   LYMPHSABS 1.6 08/01/2017 1134   LYMPHSABS 1.3 03/18/2017 1044   MONOABS 0.3 08/01/2017 1134   EOSABS 0.2 08/01/2017 1134   EOSABS 0.2 03/18/2017 1044   BASOSABS 0.0 08/01/2017 1134   BASOSABS 0.0 03/18/2017 1044    ASSESSMENT AND PLAN: 1. Controlled type 2 diabetes mellitus without complication, without long-term current use of insulin ([place]) Commended her on improvement in her A1c.  We can hold off on starting medication.  Encouraged  her to continue healthy eating habits and to try to get in exercise at least 3 to 4 days a week for 30 to 40 minutes - POCT glucose (manual entry) - POCT glycosylated hemoglobin (Hb A1C) - Microalbumin / creatinine urine ratio - CBC - Comprehensive metabolic panel - Lipid panel  2. Essential hypertension Not at goal.  Stressed the importance of good blood pressure control to prevent further cardiovascular events.  Add low-dose lisinopril as intended on last visit.  She said she will let her daughter know to pick up the prescription. - amLODipine (NORVASC) 10 MG tablet; Take 1 tablet (10 mg total) by mouth daily.  Dispense: 90 tablet; Refill: 3 - lisinopril (ZESTRIL) 5 MG tablet; Take 1 tablet (5 mg total) by mouth daily.  Dispense: 90 tablet; Refill: 3  3. History of CVA (cerebrovascular accident) - ticagrelor (BRILINTA) 90 MG TABS tablet; Take 0.5 tablets (45 mg total) by mouth 2 (two) times daily.  Dispense: 90 tablet; Refill: 3  4. Mixed hyperlipidemia - atorvastatin (LIPITOR) 40 MG tablet; Take 1 tablet (40 mg total) by mouth daily at 6 PM.  Dispense: 90 tablet; Refill: 3  5. Mammogram declined Discussed the importance of breast cancer screening.  Patient declined to have mammogram scheduled at this time  6. Colon cancer screening declined Discussed the importance of colon cancer screening.  She declines to have screening done at this time  Patient was given the opportunity to ask questions.  Patient verbalized understanding of the plan and was able to repeat key elements of the plan.   Orders Placed This Encounter  Procedures  . Microalbumin / creatinine urine ratio  . CBC  . Comprehensive metabolic panel  . Lipid panel  . POCT glucose (manual entry)  . POCT glycosylated hemoglobin (Hb A1C)     Requested Prescriptions   Signed Prescriptions Disp Refills  . amLODipine (NORVASC) 10 MG tablet 90 tablet 3    Sig: Take 1 tablet (10 mg total) by mouth daily.  . ticagrelor  (BRILINTA) 90 MG TABS tablet 90 tablet 3    Sig: Take 0.5 tablets (45 mg total) by mouth 2 (two) times daily.  Marland Kitchen atorvastatin (LIPITOR) 40 MG tablet 90 tablet 3    Sig: Take 1 tablet (40 mg total) by mouth daily at 6 PM.  . lisinopril (ZESTRIL) 5 MG tablet 90 tablet 3    Sig: Take 1 tablet (5 mg total) by mouth daily.    Return in about 4 months (around 11/14/2019).  Karle Plumber, MD, FACP

## 2019-07-16 LAB — COMPREHENSIVE METABOLIC PANEL
ALT: 18 IU/L (ref 0–32)
AST: 22 IU/L (ref 0–40)
Albumin/Globulin Ratio: 1.3 (ref 1.2–2.2)
Albumin: 4.7 g/dL (ref 3.8–4.8)
Alkaline Phosphatase: 122 IU/L — ABNORMAL HIGH (ref 39–117)
BUN/Creatinine Ratio: 17 (ref 12–28)
BUN: 17 mg/dL (ref 8–27)
Bilirubin Total: 0.3 mg/dL (ref 0.0–1.2)
CO2: 24 mmol/L (ref 20–29)
Calcium: 9.8 mg/dL (ref 8.7–10.3)
Chloride: 102 mmol/L (ref 96–106)
Creatinine, Ser: 1.01 mg/dL — ABNORMAL HIGH (ref 0.57–1.00)
GFR calc Af Amer: 68 mL/min/{1.73_m2} (ref >59)
GFR calc non Af Amer: 59 mL/min/{1.73_m2} — ABNORMAL LOW (ref >59)
Globulin, Total: 3.7 g/dL (ref 1.5–4.5)
Glucose: 97 mg/dL (ref 65–99)
Potassium: 4.7 mmol/L (ref 3.5–5.2)
Sodium: 140 mmol/L (ref 134–144)
Total Protein: 8.4 g/dL (ref 6.0–8.5)

## 2019-07-16 LAB — CBC
Hematocrit: 38.4 % (ref 34.0–46.6)
Hemoglobin: 12.6 g/dL (ref 11.1–15.9)
MCH: 29.6 pg (ref 26.6–33.0)
MCHC: 32.8 g/dL (ref 31.5–35.7)
MCV: 90 fL (ref 79–97)
Platelets: 188 10*3/uL (ref 150–450)
RBC: 4.26 x10E6/uL (ref 3.77–5.28)
RDW: 13 % (ref 11.7–15.4)
WBC: 6.3 10*3/uL (ref 3.4–10.8)

## 2019-07-16 LAB — LIPID PANEL
Chol/HDL Ratio: 3.4 ratio (ref 0.0–4.4)
Cholesterol, Total: 132 mg/dL (ref 100–199)
HDL: 39 mg/dL — ABNORMAL LOW (ref >39)
LDL Calculated: 80 mg/dL (ref 0–99)
Triglycerides: 67 mg/dL (ref 0–149)
VLDL Cholesterol Cal: 13 mg/dL (ref 5–40)

## 2019-07-16 LAB — MICROALBUMIN / CREATININE URINE RATIO
Creatinine, Urine: 162 mg/dL
Microalb/Creat Ratio: 5 mg/g creat (ref 0–29)
Microalbumin, Urine: 8 ug/mL

## 2019-07-20 ENCOUNTER — Telehealth: Payer: Self-pay

## 2019-07-20 NOTE — Telephone Encounter (Signed)
Contacted pt to go over lab results pt didn't answer lvm asking pt to give me a call at her earliest convenience  

## 2019-08-03 MED FILL — ATORVASTATIN CALCIUM 40 MG: 40 | 30 days supply | Qty: 30 | Fill #0

## 2019-08-03 MED FILL — LISINOPRIL 5 MG TAB: 5 | 30 days supply | Qty: 30 | Fill #0

## 2019-08-03 MED FILL — BRILINTA 90 MG TABLET: 90 | 30 days supply | Qty: 30 | Fill #4

## 2019-08-03 MED FILL — AMLODIPINE BESYLATE 10 MG T: 10 | 30 days supply | Qty: 30 | Fill #0

## 2019-09-03 MED FILL — LISINOPRIL 5 MG TABLET: 5 | 30 days supply | Qty: 30 | Fill #1

## 2019-09-07 MED FILL — BRILINTA 90 MG TABLET: 90 | 30 days supply | Qty: 30 | Fill #0

## 2019-09-27 MED FILL — ATORVASTATIN CALCIUM 40 MG: 40 | 30 days supply | Qty: 30 | Fill #1

## 2019-10-04 MED FILL — BRILINTA 90 MG TABLET: 90 | 30 days supply | Qty: 30 | Fill #1

## 2019-10-04 MED FILL — LISINOPRIL 5 MG TABLET: 5 | 30 days supply | Qty: 30 | Fill #2

## 2019-11-01 MED FILL — LISINOPRIL 5 MG TABLET: 5 | 30 days supply | Qty: 30 | Fill #3

## 2019-11-05 MED FILL — BRILINTA 90 MG TABLET: 90 | 30 days supply | Qty: 30 | Fill #2

## 2019-11-19 ENCOUNTER — Ambulatory Visit: Payer: Medicare Other | Attending: Internal Medicine | Admitting: Internal Medicine

## 2019-11-19 ENCOUNTER — Other Ambulatory Visit: Payer: Self-pay

## 2019-11-19 ENCOUNTER — Encounter: Payer: Self-pay | Admitting: Internal Medicine

## 2019-11-19 VITALS — Wt 110.6 lb

## 2019-11-19 DIAGNOSIS — I10 Essential (primary) hypertension: Secondary | ICD-10-CM | POA: Diagnosis not present

## 2019-11-19 DIAGNOSIS — E119 Type 2 diabetes mellitus without complications: Secondary | ICD-10-CM

## 2019-11-19 DIAGNOSIS — E782 Mixed hyperlipidemia: Secondary | ICD-10-CM | POA: Diagnosis not present

## 2019-11-19 NOTE — Progress Notes (Signed)
Virtual Visit via Telephone Note Due to current restrictions/limitations of in-office visits due to the COVID-19 pandemic, this scheduled clinical appointment was converted to a telehealth visit  I connected with Amy Howell on 11/19/19 at 8:47 a.m by telephone and verified that I am speaking with the correct person using two identifiers. I am in my office.  The patient is at home.  Only the patient and myself participated in this encounter.  I discussed the limitations, risks, security and privacy concerns of performing an evaluation and management service by telephone and the availability of in person appointments. I also discussed with the patient that there may be a patient responsible charge related to this service. The patient expressed understanding and agreed to proceed.   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 Estanislado Pandy),  glaucoma.  Last seen 07/2019.  Purpose of today's visit is chronic disease management.  HTN: Lisinopril added to Norvasc on last visit.  Reports she was not feeling right when she took them both for 3 days.  Felt down and drowsy. She started taking Lisinopril in the mornings and takes only 1/2 of the Norvasc 10 mg evenings.  Not checking BP but does have a device -no CP/SOB/LE  DM:  Requesting rxn for Ensure.  Current wgh is 110.6 lb.  Appetite is good Not a sweet eater.  Needs to cut back on starch like potatoes, rice  HL: LDL was 80 on last blood test.  Initially she told me that she was taking the Lipitor consistently.  She then later told me that she heard from other people that it can affect memory and she felt it may have been affecting her memory a little bit so she started taking it only a few times a week. Outpatient Encounter Medications as of 11/19/2019  Medication Sig  .  acetaminophen (TYLENOL) 500 MG tablet Take 1,000 mg by mouth daily as needed (for pain.).   Marland Kitchen amLODipine (NORVASC) 10 MG tablet Take 1 tablet (10 mg total) by mouth daily.  Marland Kitchen aspirin 325 MG tablet Take 1 tablet (325 mg total) by mouth daily.  Marland Kitchen atorvastatin (LIPITOR) 40 MG tablet Take 1 tablet (40 mg total) by mouth daily at 6 PM.  . brimonidine (ALPHAGAN) 0.2 % ophthalmic solution   . dorzolamide-timolol (COSOPT) 22.3-6.8 MG/ML ophthalmic solution   . feeding supplement, ENSURE ENLIVE, (ENSURE ENLIVE) LIQD Take 237 mLs by mouth 2 (two) times daily.   Marland Kitchen latanoprost (XALATAN) 0.005 % ophthalmic solution Place 1 drop into both eyes at bedtime.   Marland Kitchen lisinopril (ZESTRIL) 5 MG tablet Take 1 tablet (5 mg total) by mouth daily.  . ticagrelor (BRILINTA) 90 MG TABS tablet Take 0.5 tablets (45 mg total) by mouth 2 (two) times daily.  . timolol (TIMOPTIC) 0.5 % ophthalmic solution INSTILL 1 DROP INTO LEFT EYE EVERY MORNING   No facility-administered encounter medications on file as of 11/19/2019.      Observations/Objective: Results for orders placed or performed in visit on 07/15/19  Microalbumin / creatinine urine ratio  Result Value Ref Range   Creatinine, Urine 162.0 Not Estab. mg/dL   Microalbumin, Urine 8.0 Not Estab. ug/mL   Microalb/Creat Ratio 5 0 - 29 mg/g creat  CBC  Result Value Ref Range   WBC 6.3 3.4 - 10.8 x10E3/uL   RBC 4.26 3.77 - 5.28 x10E6/uL   Hemoglobin 12.6 11.1 -  15.9 g/dL   Hematocrit 38.4 34.0 - 46.6 %   MCV 90 79 - 97 fL   MCH 29.6 26.6 - 33.0 pg   MCHC 32.8 31.5 - 35.7 g/dL   RDW 13.0 11.7 - 15.4 %   Platelets 188 150 - 450 x10E3/uL  Comprehensive metabolic panel  Result Value Ref Range   Glucose 97 65 - 99 mg/dL   BUN 17 8 - 27 mg/dL   Creatinine, Ser 1.01 (H) 0.57 - 1.00 mg/dL   GFR calc non Af Amer 59 (L) >59 mL/min/1.73   GFR calc Af Amer 68 >59 mL/min/1.73   BUN/Creatinine Ratio 17 12 - 28   Sodium 140 134 - 144 mmol/L   Potassium 4.7 3.5 - 5.2 mmol/L    Chloride 102 96 - 106 mmol/L   CO2 24 20 - 29 mmol/L   Calcium 9.8 8.7 - 10.3 mg/dL   Total Protein 8.4 6.0 - 8.5 g/dL   Albumin 4.7 3.8 - 4.8 g/dL   Globulin, Total 3.7 1.5 - 4.5 g/dL   Albumin/Globulin Ratio 1.3 1.2 - 2.2   Bilirubin Total 0.3 0.0 - 1.2 mg/dL   Alkaline Phosphatase 122 (H) 39 - 117 IU/L   AST 22 0 - 40 IU/L   ALT 18 0 - 32 IU/L  Lipid panel  Result Value Ref Range   Cholesterol, Total 132 100 - 199 mg/dL   Triglycerides 67 0 - 149 mg/dL   HDL 39 (L) >39 mg/dL   VLDL Cholesterol Cal 13 5 - 40 mg/dL   LDL Calculated 80 0 - 99 mg/dL   Chol/HDL Ratio 3.4 0.0 - 4.4 ratio  POCT glucose (manual entry)  Result Value Ref Range   POC Glucose 101 (A) 70 - 99 mg/dl  POCT glycosylated hemoglobin (Hb A1C)  Result Value Ref Range   Hemoglobin A1C     HbA1c POC (<> result, manual entry)     HbA1c, POC (prediabetic range) 6.4 5.7 - 6.4 %   HbA1c, POC (controlled diabetic range)      Assessment and Plan: 1. Essential hypertension Advised patient that it is okay to take the amlodipine at a different time of day from the lisinopril.  However she needs to check her blood pressure to make sure that it is staying 130/80 or lower to prevent future cardiovascular events.  She has history of stroke and aneurysm  2. Controlled type 2 diabetes mellitus without complication, without long-term current use of insulin ([place]) Dietary counseling given.  She is diet controlled.  Advised patient that Glucerna would probably be better than Ensure.  I am not sure that her insurance will pay for the Ensure given that she is not underweight but I will write a prescription for it  3. Mixed hyperlipidemia Encourage patient to take the Lipitor consistently again given her history of cardiovascular events.   Follow Up Instructions: 4   I discussed the assessment and treatment plan with the patient. The patient was provided an opportunity to ask questions and all were answered. The patient agreed  with the plan and demonstrated an understanding of the instructions.   The patient was advised to call back or seek an in-person evaluation if the symptoms worsen or if the condition fails to improve as anticipated.  I provided 19 minutes of non-face-to-face time during this encounter.   Karle Plumber, MD

## 2019-11-22 MED FILL — AMLODIPINE BESYLATE 10 MG T: 10 | 30 days supply | Qty: 30 | Fill #1

## 2019-11-22 MED FILL — ATORVASTATIN CALCIUM 40 MG: 40 | 30 days supply | Qty: 30 | Fill #2

## 2019-11-30 MED FILL — LISINOPRIL 5 MG TABLET: 5 | 30 days supply | Qty: 30 | Fill #4

## 2019-11-30 MED FILL — BRILINTA 90 MG TABLET: 90 | 30 days supply | Qty: 30 | Fill #3

## 2019-12-28 MED FILL — LISINOPRIL 5 MG TABLET: 5 | 30 days supply | Qty: 30 | Fill #5

## 2020-02-02 MED FILL — AMLODIPINE BESYLATE 10 MG T: 10 | 30 days supply | Qty: 30 | Fill #2

## 2020-02-02 MED FILL — LISINOPRIL 5 MG TABLET: 5 | 30 days supply | Qty: 30 | Fill #6

## 2020-02-02 MED FILL — BRILINTA 90 MG TABLET: 90 | 30 days supply | Qty: 30 | Fill #4

## 2020-02-02 MED FILL — ATORVASTATIN CALCIUM 40 MG: 40 | 30 days supply | Qty: 30 | Fill #3

## 2020-02-04 MED FILL — predniSONE 20 MG TABS: 20 | 3 days supply | Qty: 3 | Fill #0

## 2020-03-06 MED FILL — BRILINTA 90 MG TABLET: 90 | 30 days supply | Qty: 30 | Fill #5

## 2020-03-06 MED FILL — AMLODIPINE BESYLATE 10 MG T: 10 | 30 days supply | Qty: 30 | Fill #3

## 2020-03-06 MED FILL — LISINOPRIL 5 MG TABLET: 5 | 30 days supply | Qty: 30 | Fill #7

## 2020-03-23 ENCOUNTER — Encounter: Payer: Self-pay | Admitting: Internal Medicine

## 2020-03-23 ENCOUNTER — Other Ambulatory Visit: Payer: Self-pay

## 2020-03-23 ENCOUNTER — Ambulatory Visit: Payer: Medicare Other | Attending: Internal Medicine | Admitting: Internal Medicine

## 2020-03-23 VITALS — BP 116/66 | Wt 105.5 lb

## 2020-03-23 DIAGNOSIS — F17200 Nicotine dependence, unspecified, uncomplicated: Secondary | ICD-10-CM

## 2020-03-23 DIAGNOSIS — E119 Type 2 diabetes mellitus without complications: Secondary | ICD-10-CM

## 2020-03-23 DIAGNOSIS — I10 Essential (primary) hypertension: Secondary | ICD-10-CM | POA: Diagnosis not present

## 2020-03-23 MED ORDER — HYDROCHLOROTHIAZIDE 12.5 MG PO TABS: 12.5000 mg | ORAL_TABLET | Freq: Every day | ORAL | 3 refills | Status: DC

## 2020-03-23 NOTE — Progress Notes (Signed)
Virtual Visit via Telephone Note Due to current restrictions/limitations of in-office visits due to the COVID-19 pandemic, this scheduled clinical appointment was converted to a telehealth visit  I connected with Amy Howell on 03/23/20 at 4:05 p.m by telephone and verified that I am speaking with the correct person using two identifiers. I am in my office.  The patient is at home.  Only the patient, her daughter and myself participated in this encounter.  I discussed the limitations, risks, security and privacy concerns of performing an evaluation and management service by telephone and the availability of in person appointments. I also discussed with the patient that there may be a patient responsible charge related to this service. The patient expressed understanding and agreed to proceed.   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 Estanislado Pandy),  glaucoma.  Last evaluated 11/2019   HYPERTENSION Currently taking: see medication list.  She is on lisinopril and amlodipine Med Adherence: [x]  Yes    []  No Medication side effects: []  Yes    [x]  No Adherence with salt restriction: [x]  Yes    []  No Home Monitoring?: [x]  Yes but not often    []  No Monitoring Frequency: []  Yes    []  No Home BP results range:  This morning 116/66.  Current weight is 105 pounds. SOB? []  Yes    [x]  No Chest Pain?: []  Yes    [x]  No Leg swelling?: []  Yes    [x]  No Headaches?: []  Yes    [x]  No Dizziness? []  Yes    [x]  No Comments:  Had episode of uvula swelling 01/2020.  Seen at a UC.  Given 3 pills which she states took the swelling away.  She states that did not really tell her what caused the swelling.   DIABETES TYPE 2 Last A1C:   Lab Results  Component Value Date   HGBA1C 6.4 07/15/2019  When asked about her diabetes, patient states that  she does not have diabetes.  She has been diet controlled Med Adherence:  []  Yes    []  No Medication side effects:  []  Yes    []  No Home Monitoring?  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 []  Yes    [x]  No Retinopathy hx? []  Yes    [x]  No Last eye exam:  [facility] 2-3 wks ago Comments:   Tob dep:  Still smoking but not much. Not ready to quit yet.   HM:  Got Pfizer COVID vaccine: 1st shot 02/18/2020.  2nd shot 03/10/2020,  Outpatient Encounter Medications as of 03/23/2020  Medication Sig  . acetaminophen (TYLENOL) 500 MG tablet Take 1,000 mg by mouth daily as needed (for pain.).   Marland Kitchen amLODipine (NORVASC) 10 MG tablet Take 1 tablet (10 mg total) by mouth daily.  Marland Kitchen aspirin 325 MG tablet Take 1 tablet (325 mg total) by mouth daily.  Marland Kitchen atorvastatin (LIPITOR) 40 MG tablet Take 1 tablet (40 mg total) by mouth daily at 6 PM.  . brimonidine (ALPHAGAN) 0.2 % ophthalmic solution   . dorzolamide-timolol (COSOPT) 22.3-6.8 MG/ML ophthalmic solution   . feeding supplement, ENSURE ENLIVE, (ENSURE ENLIVE) LIQD Take 237 mLs by  mouth 2 (two) times daily.   Marland Kitchen latanoprost (XALATAN) 0.005 % ophthalmic solution Place 1 drop into both eyes at bedtime.   Marland Kitchen lisinopril (ZESTRIL) 5 MG tablet Take 1 tablet (5 mg total) by mouth daily.  . ticagrelor (BRILINTA) 90 MG TABS tablet Take 0.5 tablets (45 mg total) by mouth 2 (two) times daily.  . timolol (TIMOPTIC) 0.5 % ophthalmic solution INSTILL 1 DROP INTO LEFT EYE EVERY MORNING   No facility-administered encounter medications on file as of 03/23/2020.    Observations/Objective: Results for orders placed or performed in visit on 07/15/19  Microalbumin / creatinine urine ratio  Result Value Ref Range   Creatinine, Urine 162.0 Not Estab. mg/dL   Microalbumin, Urine 8.0 Not Estab. ug/mL    Microalb/Creat Ratio 5 0 - 29 mg/g creat  CBC  Result Value Ref Range   WBC 6.3 3.4 - 10.8 x10E3/uL   RBC 4.26 3.77 - 5.28 x10E6/uL   Hemoglobin 12.6 11.1 - 15.9 g/dL   Hematocrit 38.4 34.0 - 46.6 %   MCV 90 79 - 97 fL   MCH 29.6 26.6 - 33.0 pg   MCHC 32.8 31.5 - 35.7 g/dL   RDW 13.0 11.7 - 15.4 %   Platelets 188 150 - 450 x10E3/uL  Comprehensive metabolic panel  Result Value Ref Range   Glucose 97 65 - 99 mg/dL   BUN 17 8 - 27 mg/dL   Creatinine, Ser 1.01 (H) 0.57 - 1.00 mg/dL   GFR calc non Af Amer 59 (L) >59 mL/min/1.73   GFR calc Af Amer 68 >59 mL/min/1.73   BUN/Creatinine Ratio 17 12 - 28   Sodium 140 134 - 144 mmol/L   Potassium 4.7 3.5 - 5.2 mmol/L   Chloride 102 96 - 106 mmol/L   CO2 24 20 - 29 mmol/L   Calcium 9.8 8.7 - 10.3 mg/dL   Total Protein 8.4 6.0 - 8.5 g/dL   Albumin 4.7 3.8 - 4.8 g/dL   Globulin, Total 3.7 1.5 - 4.5 g/dL   Albumin/Globulin Ratio 1.3 1.2 - 2.2   Bilirubin Total 0.3 0.0 - 1.2 mg/dL   Alkaline Phosphatase 122 (H) 39 - 117 IU/L   AST 22 0 - 40 IU/L   ALT 18 0 - 32 IU/L  Lipid panel  Result Value Ref Range   Cholesterol, Total 132 100 - 199 mg/dL   Triglycerides 67 0 - 149 mg/dL   HDL 39 (L) >39 mg/dL   VLDL Cholesterol Cal 13 5 - 40 mg/dL   LDL Calculated 80 0 - 99 mg/dL   Chol/HDL Ratio 3.4 0.0 - 4.4 ratio  POCT glucose (manual entry)  Result Value Ref Range   POC Glucose 101 (A) 70 - 99 mg/dl  POCT glycosylated hemoglobin (Hb A1C)  Result Value Ref Range   Hemoglobin A1C     HbA1c POC (<> result, manual entry)     HbA1c, POC (prediabetic range) 6.4 5.7 - 6.4 %   HbA1c, POC (controlled diabetic range)       Assessment and Plan: 1. Essential hypertension Advised patient to stop lisinopril as it may have been the cause of the uvular swelling.  I have instructed her to check her blood pressure daily for the next 3 to 4 days on just the amlodipine.  If the blood pressure is remaining 130/80 or lower, she can continue on just  amlodipine.  However if the blood pressure is running higher then we will need to add back another medication in  place of lisinopril which will be hydrochlorothiazide.  Once she has been on hydrochlorothiazide for a week, I have instructed her to come to the lab to have blood test done to check her electrolytes.  Both patient and her daughter were given these instructions. - hydrochlorothiazide (HYDRODIURIL) 12.5 MG tablet; Take 1 tablet (12.5 mg total) by mouth daily. Stop Lisinopril  Dispense: 30 tablet; Refill: 3 - Basic Metabolic Panel; Future  2. Controlled type 2 diabetes mellitus without complication, without long-term current use of insulin ([facility]) Diet controlled.  Encouraged her to continue healthy eating habits and regular exercise - Hemoglobin A1c; Future  3. Tobacco dependence Advised to quit.  However patient not ready to give a trial of quitting.   Follow Up Instructions: 4 months   I discussed the assessment and treatment plan with the patient. The patient was provided an opportunity to ask questions and all were answered. The patient agreed with the plan and demonstrated an understanding of the instructions.   The patient was advised to call back or seek an in-person evaluation if the symptoms worsen or if the condition fails to improve as anticipated.  I provided 19minutes of non-face-to-face time during this encounter.   Karle Plumber, MD

## 2020-03-24 MED FILL — HYDROCHLOROTHIAZIDE 12.5 MG: 12.5 | 30 days supply | Qty: 30 | Fill #0

## 2020-04-03 MED FILL — AMLODIPINE BESYLATE 10 MG T: 10 | 30 days supply | Qty: 30 | Fill #4

## 2020-04-03 MED FILL — ATORVASTATIN CALCIUM 40 MG: 40 | 30 days supply | Qty: 30 | Fill #4

## 2020-04-03 MED FILL — BRILINTA 90 MG TABLET: 90 | 30 days supply | Qty: 30 | Fill #6

## 2020-05-04 MED FILL — AMLODIPINE BESYLATE 10 MG T: 10 | 30 days supply | Qty: 30 | Fill #5

## 2020-05-04 MED FILL — ATORVASTATIN CALCIUM 40 MG: 40 | 30 days supply | Qty: 30 | Fill #5

## 2020-05-04 MED FILL — BRILINTA 90 MG TABLET: 90 | 30 days supply | Qty: 30 | Fill #7

## 2020-06-06 MED FILL — AMLODIPINE BESYLATE 10 MG T: 10 | 30 days supply | Qty: 30 | Fill #6

## 2020-06-06 MED FILL — BRILINTA 90 MG TABLET: 90 | 30 days supply | Qty: 30 | Fill #8

## 2020-07-03 MED FILL — AMLODIPINE BESYLATE 10 MG T: 10 | 30 days supply | Qty: 30 | Fill #7

## 2020-07-03 MED FILL — BRILINTA 90 MG TABLET: 90 | 30 days supply | Qty: 30 | Fill #9

## 2020-07-24 ENCOUNTER — Ambulatory Visit: Payer: Medicare Other | Admitting: Internal Medicine

## 2020-08-03 ENCOUNTER — Other Ambulatory Visit: Payer: Self-pay | Admitting: Internal Medicine

## 2020-08-03 DIAGNOSIS — Z8673 Personal history of transient ischemic attack (TIA), and cerebral infarction without residual deficits: Secondary | ICD-10-CM

## 2020-08-03 DIAGNOSIS — I10 Essential (primary) hypertension: Secondary | ICD-10-CM

## 2020-08-04 ENCOUNTER — Other Ambulatory Visit: Payer: Self-pay | Admitting: Internal Medicine

## 2020-08-04 MED FILL — AMLODIPINE BESYLATE 10 MG T: 10 | 30 days supply | Qty: 30 | Fill #0

## 2020-08-04 MED FILL — BRILINTA 90 MG TABLET: 90 | 30 days supply | Qty: 30 | Fill #0

## 2020-09-01 MED FILL — AMLODIPINE BESYLATE 10 MG T: 10 | 30 days supply | Qty: 30 | Fill #1

## 2020-09-01 MED FILL — BRILINTA 90 MG TABLET: 90 | 30 days supply | Qty: 30 | Fill #1

## 2020-10-02 MED FILL — BRILINTA 90 MG TABLET: 90 | 30 days supply | Qty: 30 | Fill #2

## 2020-10-02 MED FILL — AMLODIPINE BESYLATE 10 MG T: 10 | 30 days supply | Qty: 30 | Fill #2

## 2020-10-06 ENCOUNTER — Other Ambulatory Visit: Payer: Self-pay | Admitting: Ophthalmology

## 2020-10-06 MED FILL — LATANOPROST 0.005% EYE DRP: 0.005 | 37 days supply | Qty: 5 | Fill #0

## 2020-10-06 MED FILL — DORZOLAMIDE HCL 2% EYE DRP: 2 | 25 days supply | Qty: 10 | Fill #0

## 2020-10-06 MED FILL — TIMOLOL 0.5% EYE DROPS: 0.5 | 25 days supply | Qty: 5 | Fill #0

## 2020-10-06 MED FILL — BRIMONIDINE 0.2% EYE DROP: 0.2 | 25 days supply | Qty: 10 | Fill #0

## 2020-10-25 ENCOUNTER — Other Ambulatory Visit: Payer: Self-pay | Admitting: Optometry

## 2020-10-25 MED FILL — LATANOPROST 0.005% EYE DRP: 0.005 | 36 days supply | Qty: 5 | Fill #0

## 2020-10-25 MED FILL — DORZOLAMIDE HCL 2% EYE DRP: 2 | 74 days supply | Qty: 20 | Fill #0

## 2020-10-25 MED FILL — BRIMONIDINE 0.2% EYE DROP: 0.2 | 112 days supply | Qty: 30 | Fill #0

## 2020-10-25 MED FILL — TIMOLOL MALEATE 0.5 % SOLN: 0.5 | 112 days supply | Qty: 15 | Fill #0

## 2020-10-31 ENCOUNTER — Other Ambulatory Visit: Payer: Self-pay | Admitting: Internal Medicine

## 2020-10-31 ENCOUNTER — Encounter: Payer: Self-pay | Admitting: Internal Medicine

## 2020-10-31 ENCOUNTER — Ambulatory Visit: Payer: Medicare Other | Attending: Internal Medicine | Admitting: Internal Medicine

## 2020-10-31 ENCOUNTER — Other Ambulatory Visit: Payer: Self-pay

## 2020-10-31 VITALS — BP 160/70 | HR 67 | Temp 97.0°F | Resp 16 | Ht 61.0 in | Wt 105.0 lb

## 2020-10-31 DIAGNOSIS — E1136 Type 2 diabetes mellitus with diabetic cataract: Secondary | ICD-10-CM | POA: Diagnosis not present

## 2020-10-31 DIAGNOSIS — I10 Essential (primary) hypertension: Secondary | ICD-10-CM | POA: Diagnosis not present

## 2020-10-31 DIAGNOSIS — Z8673 Personal history of transient ischemic attack (TIA), and cerebral infarction without residual deficits: Secondary | ICD-10-CM

## 2020-10-31 DIAGNOSIS — E1139 Type 2 diabetes mellitus with other diabetic ophthalmic complication: Secondary | ICD-10-CM

## 2020-10-31 DIAGNOSIS — F17200 Nicotine dependence, unspecified, uncomplicated: Secondary | ICD-10-CM

## 2020-10-31 DIAGNOSIS — Z5320 Procedure and treatment not carried out because of patient's decision for unspecified reasons: Secondary | ICD-10-CM

## 2020-10-31 DIAGNOSIS — Z2821 Immunization not carried out because of patient refusal: Secondary | ICD-10-CM

## 2020-10-31 DIAGNOSIS — E119 Type 2 diabetes mellitus without complications: Secondary | ICD-10-CM | POA: Diagnosis not present

## 2020-10-31 DIAGNOSIS — E782 Mixed hyperlipidemia: Secondary | ICD-10-CM

## 2020-10-31 DIAGNOSIS — H42 Glaucoma in diseases classified elsewhere: Secondary | ICD-10-CM | POA: Diagnosis not present

## 2020-10-31 LAB — POCT GLYCOSYLATED HEMOGLOBIN (HGB A1C): HbA1c, POC (prediabetic range): 5.7 % (ref 5.7–6.4)

## 2020-10-31 LAB — GLUCOSE, POCT (MANUAL RESULT ENTRY): POC Glucose: 87 mg/dl (ref 70–99)

## 2020-10-31 MED ORDER — HYDROCHLOROTHIAZIDE 12.5 MG PO TABS: 12.5000 mg | ORAL_TABLET | Freq: Every day | ORAL | 1 refills | Status: DC

## 2020-10-31 MED ORDER — AMLODIPINE BESYLATE 10 MG PO TABS: 10.0000 mg | ORAL_TABLET | Freq: Every day | ORAL | 1 refills | Status: DC

## 2020-10-31 MED ORDER — TICAGRELOR 90 MG PO TABS: ORAL_TABLET | ORAL | 1 refills | Status: DC

## 2020-10-31 MED FILL — BRILINTA 90 MG TABLET: 90 | 90 days supply | Qty: 90 | Fill #0

## 2020-10-31 MED FILL — AMLODIPINE BESYLATE 10 MG T: 10 | 90 days supply | Qty: 90 | Fill #0

## 2020-10-31 NOTE — Progress Notes (Signed)
Patient ID: Amy Howell, female    DOB: June 11, 1956  MRN: [id number]  CC: Hypertension and Diabetes   Subjective: Amy Howell is a 64 y.o. 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 Estanislado Pandy),  glaucoma.    Declines blood test because she is billed each time.  She also declines having urine microalbumin. Going to [place] for 3 mths.  Would like to get 95-month supply on all of her medications.  Wants to hold off on MMG and c-scope until she gets back. Declines flu shot, Tdap and Prevnar 13  DM: Results for orders placed or performed in visit on 10/31/20  POCT glucose (manual entry)  Result Value Ref Range   POC Glucose 87 70 - 99 mg/dl  POCT glycosylated hemoglobin (Hb A1C)  Result Value Ref Range   Hemoglobin A1C     HbA1c POC (<> result, manual entry)     HbA1c, POC (prediabetic range) 5.7 5.7 - 6.4 %   HbA1c, POC (controlled diabetic range)     Reports she does okay with eating habits - not a lot of bread, red meat, pork.  Drinks water and Ensure.  Does stretching exercises Saw Dr. Schuyler Amor recently and had refills on her glaucoma eyedrops.  Told that she also has cataracts.  HYPERTENSION Currently taking: see medication list.  Lisinopril d/c on last visit due to angioedema.  HCTZ started instead and she was to continue with amlodipine.  She tells me today that she is only taking amlodipine but the pharmacy never gave her the HCTZ.   Med Adherence: [x]  Yes    []  No Medication side effects: []  Yes    [x]  No Adherence with salt restriction: [x]  Yes    []  No Home Monitoring?: [x]  Yes    []  No Monitoring Frequency: 2-3 x a wk Home BP results range: Forgot to bring log. Reports it has been good SOB? []  Yes    [x]  No Chest Pain?: []  Yes    [x]  No Leg  swelling?: []  Yes    [x]  No Headaches?: []  Yes    [x]  No Dizziness? []  Yes    [x]  No Comments:   HL:  She stopped Lipitor "because I was forgetting stuff."  She tells me that her cousin who works at the hospital told her that the Lipitor can sometimes cause memory issues.  Tob dep:  Still smoking about 3 cigarettes/day.  Wanting to quit but finding it hard to give up the last 3 cigarettes. Patient Active Problem List   Diagnosis Date Noted  . Colon cancer screening declined 07/15/2019  . Mammogram declined 07/15/2019  . 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 Tobacco dependence 03/30/2018  . Glaucoma of both eyes 03/30/2018  . History of loop recorder 03/30/2018  . Prediabetes 04/10/2017  . Cryptogenic stroke ([place])   . Aneurysm of middle cerebral artery   . Weight loss, unintentional   . Basilar artery stenosis   . Essential hypertension 03/26/2017     Current Outpatient Medications on File Prior to Visit  Medication Sig Dispense Refill  . acetaminophen (TYLENOL) 500 MG tablet Take 1,000 mg by mouth daily as needed (for pain.).     Marland Kitchen aspirin 325 MG tablet Take 1 tablet (325 mg total) by mouth daily. 90 tablet 3  .  brimonidine (ALPHAGAN) 0.2 % ophthalmic solution     . dorzolamide-timolol (COSOPT) 22.3-6.8 MG/ML ophthalmic solution     . feeding supplement, ENSURE ENLIVE, (ENSURE ENLIVE) LIQD Take 237 mLs by mouth 2 (two) times daily.     Marland Kitchen latanoprost (XALATAN) 0.005 % ophthalmic solution Place 1 drop into both eyes at bedtime.   11  . timolol (TIMOPTIC) 0.5 % ophthalmic solution INSTILL 1 DROP INTO LEFT EYE EVERY MORNING  99   No current facility-administered medications on file prior to visit.    Allergies  Allergen Reactions  . Lisinopril Other (See Comments)    Angioedema    Social History   Socioeconomic History  . Marital status: Single    Spouse name: Not on file  . Number of children: Not on file  . Years of education: Not on  file  . Highest education level: Not on file  Occupational History  . Not on file  Tobacco Use  . Smoking status: Current Every Day Smoker    Packs/day: 0.25    Types: Cigarettes  . Smokeless tobacco: Never Used  . Tobacco comment: 1 cigarette a day   Vaping Use  . Vaping Use: Never used  Substance and Sexual Activity  . Alcohol use: No  . Drug use: No  . Sexual activity: Not on file  Other Topics Concern  . Not on file  Social History Narrative  . Not on file   Social Determinants of Health   Financial Resource Strain:   . Difficulty of Paying Living Expenses: Not on file  Food Insecurity:   . Worried About Charity fundraiser in the Last Year: Not on file  . Ran Out of Food in the Last Year: Not on file  Transportation Needs:   . Lack of Transportation (Medical): Not on file  . Lack of Transportation (Non-Medical): Not on file  Physical Activity:   . Days of Exercise per Week: Not on file  . Minutes of Exercise per Session: Not on file  Stress:   . Feeling of Stress : Not on file  Social Connections:   . Frequency of Communication with Friends and Family: Not on file  . Frequency of Social Gatherings with Friends and Family: Not on file  . Attends Religious Services: Not on file  . Active Member of Clubs or Organizations: Not on file  . Attends Archivist Meetings: Not on file  . Marital Status: Not on file  Intimate Partner Violence:   . Fear of Current or Ex-Partner: Not on file  . Emotionally Abused: Not on file  . Physically Abused: Not on file  . Sexually Abused: Not on file    Family History  Problem Relation Age of Onset  . Hypertension Father   . Diabetes Father     Past Surgical History:  Procedure Laterality Date  . ABDOMINAL HYSTERECTOMY    . CARDIAC DEFIBRILLATOR PLACEMENT    . gum operation    . Implantable loop recorder removal  02/01/2019   MDT Reveal LINQ explantation in office by JA  . IR 3D INDEPENDENT WKST  05/07/2017  . IR  ANGIO INTRA EXTRACRAN SEL COM CAROTID INNOMINATE BILAT MOD SED  04/23/2017  . IR ANGIO INTRA EXTRACRAN SEL COM CAROTID INNOMINATE BILAT MOD SED  08/06/2017  . IR ANGIO INTRA EXTRACRAN SEL COM CAROTID INNOMINATE UNI R MOD SED  05/07/2017  . IR ANGIO VERTEBRAL SEL VERTEBRAL BILAT MOD SED  04/23/2017  . IR ANGIO VERTEBRAL SEL  VERTEBRAL BILAT MOD SED  08/06/2017  . IR ANGIOGRAM FOLLOW UP STUDY  05/07/2017  . IR ANGIOGRAM SELECTIVE EACH ADDITIONAL VESSEL  05/07/2017  . IR RADIOLOGIST EVAL & MGMT  04/16/2017  . IR RADIOLOGIST EVAL & MGMT  05/29/2017  . IR TRANSCATH/EMBOLIZ  05/07/2017  . LOOP RECORDER INSERTION N/A 03/28/2017   Procedure: Loop Recorder Insertion;  Surgeon: Thompson Grayer, MD;  Location: [facility];  Service: Cardiovascular;  Laterality: N/A;  . RADIOLOGY WITH ANESTHESIA N/A 05/07/2017   Procedure: EMBOLIZATION;  Surgeon: Luanne Bras, MD;  Location: [facility];  Service: Radiology;  Laterality: N/A;  . RADIOLOGY WITH ANESTHESIA N/A 08/06/2017   Procedure: RADIOLOGY WITH ANESTHESIA STENTING;  Surgeon: Luanne Bras, MD;  Location: [facility];  Service: Radiology;  Laterality: N/A;  . TEE WITHOUT CARDIOVERSION N/A 03/28/2017   Procedure: TRANSESOPHAGEAL ECHOCARDIOGRAM (TEE);  Surgeon: Lelon Perla, MD;  Location: [facility];  Service: Cardiovascular;  Laterality: N/A;    ROS: Review of Systems Negative except as stated above  PHYSICAL EXAM: BP (!) 160/70   Pulse 67   Temp (!) 97 F (36.1 C)   Resp 16   Ht 5\' 1"  (1.549 m)   Wt 105 lb (47.6 kg)   SpO2 99%   BMI 19.84 kg/m   Wt Readings from Last 3 Encounters:  10/31/20 105 lb (47.6 kg)  03/23/20 105 lb 8 oz (47.9 kg)  11/19/19 110 lb 9.6 oz (50.2 kg)    Physical Exam  General appearance - alert, well appearing, older African-American female and in no distress Mental status - normal mood, behavior, speech, dress, motor activity, and thought processes Eyes - pupils equal and reactive, extraocular eye movements intact Neck -  supple, no significant adenopathy Chest - clear to auscultation, no wheezes, rales or rhonchi, symmetric air entry Heart - normal rate, regular rhythm, normal S1, S2, no murmurs, rubs, clicks or gallops Extremities - peripheral pulses normal, no pedal edema, no clubbing or cyanosis Diabetic Foot Exam - Simple   Simple Foot Form Visual Inspection See comments: Yes Sensation Testing Intact to touch and monofilament testing bilaterally: Yes Pulse Check Posterior Tibialis and Dorsalis pulse intact bilaterally: Yes Comments Flat footed      CMP Latest Ref Rng & Units 07/15/2019 08/20/2018 12/29/2017  Glucose 65 - 99 mg/dL 97 119(H) -  BUN 8 - 27 mg/dL 17 15 -  Creatinine 0.57 - 1.00 mg/dL 1.01(H) 0.95 -  Sodium 134 - 144 mmol/L 140 140 -  Potassium 3.5 - 5.2 mmol/L 4.7 4.0 -  Chloride 96 - 106 mmol/L 102 105 -  CO2 20 - 29 mmol/L 24 22 -  Calcium 8.7 - 10.3 mg/dL 9.8 9.5 -  Total Protein 6.0 - 8.5 g/dL 8.4 - 7.7  Total Bilirubin 0.0 - 1.2 mg/dL 0.3 - 0.2  Alkaline Phos 39 - 117 IU/L 122(H) - 115  AST 0 - 40 IU/L 22 - 17  ALT 0 - 32 IU/L 18 - 18   Lipid Panel     Component Value Date/Time   CHOL 132 07/15/2019 1103   TRIG 67 07/15/2019 1103   HDL 39 (L) 07/15/2019 1103   CHOLHDL 3.4 07/15/2019 1103   CHOLHDL 4.6 03/27/2017 1056   VLDL 17 03/27/2017 1056   LDLCALC 80 07/15/2019 1103    CBC    Component Value Date/Time   WBC 6.3 07/15/2019 1103   WBC 6.2 08/01/2017 1134   RBC 4.26 07/15/2019 1103   RBC 4.18 08/01/2017 1134  HGB 12.6 07/15/2019 1103   HCT 38.4 07/15/2019 1103   PLT 188 07/15/2019 1103   MCV 90 07/15/2019 1103   MCH 29.6 07/15/2019 1103   MCH 28.9 08/01/2017 1134   MCHC 32.8 07/15/2019 1103   MCHC 31.1 08/01/2017 1134   RDW 13.0 07/15/2019 1103   LYMPHSABS 1.6 08/01/2017 1134   LYMPHSABS 1.3 03/18/2017 1044   MONOABS 0.3 08/01/2017 1134   EOSABS 0.2 08/01/2017 1134   EOSABS 0.2 03/18/2017 1044   BASOSABS 0.0 08/01/2017 1134   BASOSABS 0.0  03/18/2017 1044    ASSESSMENT AND PLAN: 1. 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  Encouraged her to continue healthy eating habits. - POCT glucose (manual entry) - POCT glycosylated hemoglobin (Hb A1C)  2. Essential hypertension Not at goal.  Discussed health risks associated with uncontrolled blood pressure.  She is at increased risk for another cardiovascular event.  Encouraged her to take the amlodipine and the hydrochlorothiazide.  I have sent both prescriptions to the pharmacy today.  She states that she will pick up both.  Advised to continue checking blood pressure with goal being 130/80 or lower. - hydrochlorothiazide (HYDRODIURIL) 12.5 MG tablet; Take 1 tablet (12.5 mg total) by mouth daily. Stop Lisinopril  Dispense: 90 tablet; Refill: 1 - amLODipine (NORVASC) 10 MG tablet; Take 1 tablet (10 mg total) by mouth daily.  Dispense: 90 tablet; Refill: 1  3. Mixed hyperlipidemia Given history of stroke and carotid stenosis, I recommend taking a statin medication.  Advised that several years ago there was concern that statins can affect memory but I do not think it panned out in subsequent studies.  We can try changing Lipitor to a different statin.  However patient declined not wanting to be on any cholesterol medication.  4. Statin declined See #3 above  5. Tobacco dependence Wrongly advised to quit smoking.  Discussed health risks associated with smoking.  She will continue to work on trying to quit.  Declines any medicines at this time.  Less than 5 minutes spent on counseling  6. Influenza vaccination declined Recommended.  Patient declined  7. Pneumococcal vaccination declined Recommended.  Patient declined  8. History of CVA (cerebrovascular accident) - ticagrelor (BRILINTA) 90 MG TABS tablet; TAKE 1/2 TABLET BY MOUTH 2 (TWO) TIMES DAILY.  Dispense: 90 tablet; Refill: 1  Discussed with patient the importance of  doing at least yearly blood tests including checking kidney and liver function, cholesterol and blood count.  Patient still declines stating she does not want to be billed for what her insurance would not cover.    Patient was given the opportunity to ask questions.  Patient verbalized understanding of the plan and was able to repeat key elements of the plan.   Orders Placed This Encounter  Procedures  . POCT glucose (manual entry)  . POCT glycosylated hemoglobin (Hb A1C)     Requested Prescriptions   Signed Prescriptions Disp Refills  . ticagrelor (BRILINTA) 90 MG TABS tablet 90 tablet 1    Sig: TAKE 1/2 TABLET BY MOUTH 2 (TWO) TIMES DAILY.  . hydrochlorothiazide (HYDRODIURIL) 12.5 MG tablet 90 tablet 1    Sig: Take 1 tablet (12.5 mg total) by mouth daily. Stop Lisinopril  . amLODipine (NORVASC) 10 MG tablet 90 tablet 1    Sig: Take 1 tablet (10 mg total) by mouth daily.    Return in about 4 months (around 02/28/2021).  Karle Plumber, MD, FACP

## 2020-10-31 NOTE — Patient Instructions (Signed)
Your blood pressure is not controlled.  You should take amlodipine and hydrochlorothiazide daily as prescribed.  I strongly encourage you to try to quit smoking.  This increases your risk for having another stroke and also increases risk for heart attack.

## 2021-02-07 ENCOUNTER — Other Ambulatory Visit: Payer: Self-pay | Admitting: Optometry

## 2021-02-07 MED FILL — BRIMONIDINE 0.2% EYE DROP: 0.2 | 37 days supply | Qty: 10 | Fill #0

## 2021-02-07 MED FILL — TIMOLOL MALEATE 0.5 % SOLN: 0.5 | 112 days supply | Qty: 15 | Fill #1

## 2021-02-07 MED FILL — AMLODIPINE BESYLATE 10 MG T: 10 | 90 days supply | Qty: 90 | Fill #1

## 2021-02-07 MED FILL — LATANOPROST 0.005% EYE DRP: 0.005 | 37 days supply | Qty: 5 | Fill #0

## 2021-02-07 MED FILL — BRILINTA 90 MG TABLET: 90 | 90 days supply | Qty: 90 | Fill #1

## 2021-02-08 MED FILL — DORZOLAMIDE-TIMOLOL EYE DRP: 22.3-6.8 | 38 days supply | Qty: 10 | Fill #0

## 2021-05-05 ENCOUNTER — Other Ambulatory Visit: Payer: Self-pay | Admitting: Internal Medicine

## 2021-05-05 DIAGNOSIS — Z8673 Personal history of transient ischemic attack (TIA), and cerebral infarction without residual deficits: Secondary | ICD-10-CM

## 2021-05-05 NOTE — Telephone Encounter (Signed)
Requested medications are due for refill today yes  Requested medications are on the active medication list  yes  Last refill 02/07/21  Last visit 10/31/2020  Future visit scheduled No, was supposed to come back end of Mar, labs are from 2020   Notes to clinic Failed protocol due to no valid visit within 6  months labs more than 52 days old, no upcoming visit scheduled.

## 2021-05-07 ENCOUNTER — Other Ambulatory Visit: Payer: Self-pay

## 2021-05-07 MED ORDER — TICAGRELOR 90 MG PO TABS
ORAL_TABLET | ORAL | 0 refills | Status: DC
  Filled 2021-05-07: qty 30, 30d supply, fill #0

## 2021-05-09 ENCOUNTER — Other Ambulatory Visit: Payer: Self-pay

## 2021-06-06 ENCOUNTER — Other Ambulatory Visit: Payer: Self-pay | Admitting: Internal Medicine

## 2021-06-06 DIAGNOSIS — Z8673 Personal history of transient ischemic attack (TIA), and cerebral infarction without residual deficits: Secondary | ICD-10-CM

## 2021-06-06 NOTE — Telephone Encounter (Signed)
Requested medication (s) are due for refill today: Yes  Requested medication (s) are on the active medication list: Yes  Last refill:  05/09/21  Future visit scheduled: No  Notes to clinic:  Unable to refill per protocol, appointment needed. See notes attached.      Requested Prescriptions  Pending Prescriptions Disp Refills   ticagrelor (BRILINTA) 90 MG TABS tablet 30 tablet 0      Not Delegated - Hematology: Antiplatelets - ticagrelor Failed - 06/06/2021  2:48 PM      Failed - This refill cannot be delegated      Failed - Cr in normal range and within 180 days    Creatinine, Ser  Date Value Ref Range Status  07/15/2019 1.01 (H) 0.57 - 1.00 mg/dL Final   Creatinine, POC  Date Value Ref Range Status  04/10/2017 200 mg/dL Final          Failed - HCT in normal range and within 180 days    Hematocrit  Date Value Ref Range Status  07/15/2019 38.4 34.0 - 46.6 % Final          Failed - HGB in normal range and within 180 days    Hemoglobin  Date Value Ref Range Status  07/15/2019 12.6 11.1 - 15.9 g/dL Final          Failed - PLT in normal range and within 180 days    Platelets  Date Value Ref Range Status  07/15/2019 188 150 - 450 x10E3/uL Final          Failed - Valid encounter within last 6 months    Recent Outpatient Visits           7 months ago 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

## 2021-06-06 NOTE — Telephone Encounter (Signed)
Patient has scheduled a medication follow up with Prov. McClung for 06/28/21  Please contact to further advise

## 2021-06-06 NOTE — Telephone Encounter (Signed)
Patient called and advised she will need an OV for additional refills as noted on the Rx. She verbalized understanding. She says her daughter brings her, so she will have to call and schedule the appointment. I advised to let her daughter know to call the office.

## 2021-06-07 ENCOUNTER — Other Ambulatory Visit: Payer: Self-pay

## 2021-06-07 MED ORDER — TICAGRELOR 90 MG PO TABS
ORAL_TABLET | ORAL | 0 refills | Status: DC
  Filled 2021-06-07: qty 30, 30d supply, fill #0

## 2021-06-11 ENCOUNTER — Other Ambulatory Visit: Payer: Self-pay

## 2021-06-28 ENCOUNTER — Other Ambulatory Visit: Payer: Self-pay

## 2021-06-28 ENCOUNTER — Ambulatory Visit: Payer: Medicare (Managed Care) | Attending: Physician Assistant | Admitting: Physician Assistant

## 2021-06-28 VITALS — BP 175/63 | HR 67 | Ht 61.0 in | Wt 99.8 lb

## 2021-06-28 DIAGNOSIS — E1139 Type 2 diabetes mellitus with other diabetic ophthalmic complication: Secondary | ICD-10-CM | POA: Diagnosis not present

## 2021-06-28 DIAGNOSIS — E1136 Type 2 diabetes mellitus with diabetic cataract: Secondary | ICD-10-CM

## 2021-06-28 DIAGNOSIS — I10 Essential (primary) hypertension: Secondary | ICD-10-CM | POA: Diagnosis not present

## 2021-06-28 DIAGNOSIS — Z8673 Personal history of transient ischemic attack (TIA), and cerebral infarction without residual deficits: Secondary | ICD-10-CM

## 2021-06-28 DIAGNOSIS — H42 Glaucoma in diseases classified elsewhere: Secondary | ICD-10-CM

## 2021-06-28 DIAGNOSIS — E782 Mixed hyperlipidemia: Secondary | ICD-10-CM

## 2021-06-28 MED ORDER — TICAGRELOR 90 MG PO TABS
ORAL_TABLET | ORAL | 5 refills | Status: DC
  Filled 2021-06-28: qty 30, fill #0
  Filled 2021-07-07: qty 30, 30d supply, fill #0
  Filled 2021-08-04: qty 30, 30d supply, fill #1
  Filled 2021-09-03: qty 30, 30d supply, fill #2
  Filled 2021-10-06: qty 30, 30d supply, fill #3
  Filled 2021-11-03: qty 30, 30d supply, fill #4

## 2021-06-28 MED ORDER — AMLODIPINE BESYLATE 10 MG PO TABS
ORAL_TABLET | Freq: Every day | ORAL | 1 refills | Status: DC
  Filled 2021-06-28: qty 90, 90d supply, fill #0
  Filled 2021-09-21: qty 90, 90d supply, fill #1

## 2021-06-28 MED ORDER — HYDROCHLOROTHIAZIDE 12.5 MG PO CAPS
12.5000 mg | ORAL_CAPSULE | Freq: Every day | ORAL | 1 refills | Status: DC
  Filled 2021-06-28: qty 90, 90d supply, fill #0

## 2021-06-28 NOTE — Progress Notes (Signed)
Patient ID: Amy Howell, female   DOB: 05/19/1956, 65 y.o.   MRN: [id number]   Amy Howell, is a 65 y.o. female  [id number]  [id number]  DOB - [birth year]  Subjective:  Chief Complaint and HPI: Amy Howell is a 65 y.o. female here today for RF.  She went to [place] in march to bring her sick dad here.  He passed Away on father's day.  She has only been taking amlodipine but per her PCP last visit, she was also supposed to be taking HCTZ.  She expresses being upset bc I want her to have labs today.  She has not had labs other than A1C since 07/2019.  She denies any issues or concerns today.  ROS:   Constitutional:  No f/c, No night sweats, No unexplained weight loss. EENT:  No vision changes, No blurry vision, No hearing changes. No mouth, throat, or ear problems.  Respiratory: No cough, No SOB Cardiac: No CP, no palpitations GI:  No abd pain, No N/V/D. GU: No Urinary s/sx Musculoskeletal: No joint pain Neuro: No headache, no dizziness, no motor weakness.  Skin: No rash Endocrine:  No polydipsia. No polyuria.  Psych: Denies SI/HI  No problems updated.  ALLERGIES: Allergies  Allergen Reactions   Lisinopril Other (See Comments)    Angioedema    PAST MEDICAL HISTORY: Past Medical History:  Diagnosis Date   Cataracts, bilateral    Cerebral infarction due to carotid artery stenosis ([facility])    Glaucoma    Headache    Hypertension    Stroke ([place])    no residual, "series of mini strokes"   Vertigo     MEDICATIONS AT HOME: Prior to Admission medications   Medication Sig Start Date End Date Taking? Authorizing Provider  acetaminophen (TYLENOL) 500 MG tablet Take 1,000 mg by mouth daily as needed (for pain.).    Yes [provider]  aspirin 325 MG tablet Take 1 tablet (325 mg total) by mouth daily. 06/30/17  Yes Hairston, Mandesia R, FNP  brimonidine (ALPHAGAN) 0.2 % ophthalmic solution INSTILL 1 DROP INTO BOTH EYES TWICE A DAY 10/06/20 10/06/21 Yes  Clent Jacks, MD  dorzolamide (TRUSOPT) 2 % ophthalmic solution INSTILL 1 DROP INTO BOTH EYES TWICE A DAY 10/25/20 10/25/21 Yes Warden Fillers, MD  dorzolamide-timolol (COSOPT) 22.3-6.8 MG/ML ophthalmic solution INSTILL 1 DROP INTO BOTH EYES 2 TIMES A DAY 02/07/21 02/07/22 Yes Warden Fillers, MD  feeding supplement, ENSURE ENLIVE, (ENSURE ENLIVE) LIQD Take 237 mLs by mouth 2 (two) times daily.   Yes [provider]  latanoprost (XALATAN) 0.005 % ophthalmic solution INSTILL 1 DROP INTO BOTH EYES EVERY NIGHT 10/06/20 10/06/21 Yes Clent Jacks, MD  timolol (TIMOPTIC) 0.5 % ophthalmic solution INSTILL 1 DROP INTO BOTH EYES EVERY MORNING 10/25/20 10/25/21 Yes Warden Fillers, MD  amLODipine (NORVASC) 10 MG tablet TAKE 1 TABLET (10 MG TOTAL) BY MOUTH DAILY. 06/28/21 06/28/22  Argentina Donovan, PA-C  hydrochlorothiazide (HYDRODIURIL) 12.5 MG tablet Take 1 tablet (12.5 mg total) by mouth daily. Stop Lisinopril 06/28/21   Argentina Donovan, PA-C  ticagrelor (BRILINTA) 90 MG TABS tablet TAKE 1/2 TAB BY MOUTH TWICE DAILY 06/28/21 06/28/22  Argentina Donovan, PA-C     Objective:  EXAM:   Vitals:   06/28/21 1455  BP: (!) 175/63  Pulse: 67  SpO2: 99%  Weight: 99 lb 12.8 oz (45.3 kg)  Height: '5\' 1"'$  (1.549 m)    General appearance : A&OX3. NAD. Non-toxic-appearing HEENT: Atraumatic and Normocephalic.  PERRLA.  EOM intact. Chest/Lungs:  Breathing-non-labored, Good air entry bilaterally, breath sounds normal without rales, rhonchi, or wheezing  CVS: S1 S2 regular, no murmurs, gallops, rubs  Extremities: Bilateral Lower Ext shows no edema, both legs are warm to touch with = pulse throughout Neurology:  CN II-XII grossly intact, Non focal.   Psych:  TP linear. J/I WNL. Normal speech. Appropriate eye contact and affect.  Skin:  No Rash  Data Review Lab Results  Component Value Date   HGBA1C 5.7 10/31/2020   HGBA1C 6.4 07/15/2019   HGBA1C 6.9 01/08/2019     Assessment & Plan   1.  Essential hypertension Not controlled-I advise she resume HCTZ.  She reluctantly agreed to labs today - amLODipine (NORVASC) 10 MG tablet; TAKE 1 TABLET (10 MG TOTAL) BY MOUTH DAILY.  Dispense: 90 tablet; Refill: 1 - Comprehensive metabolic panel - hydrochlorothiazide (HYDRODIURIL) 12.5 MG tablet; Take 1 tablet (12.5 mg total) by mouth daily. Stop Lisinopril  Dispense: 90 tablet; Refill: 1  2. Mixed hyperlipidemia - Comprehensive metabolic panel - Lipid panel  3. History of CVA (cerebrovascular accident) - Comprehensive metabolic panel - Lipid panel - CBC with Differential/Platelet - ticagrelor (BRILINTA) 90 MG TABS tablet; TAKE 1/2 TAB BY MOUTH TWICE DAILY  Dispense: 30 tablet; Refill: 5   Patient have been counseled extensively about nutrition and exercise  Return in about 6 months (around 12/29/2021) for PCP.  The patient was given clear instructions to go to ER or return to medical center if symptoms don't improve, worsen or new problems develop. The patient verbalized understanding. The patient was told to call to get lab results if they haven't heard anything in the next week.     Freeman Caldron, PA-C [facility] [place], [place]   06/28/2021, 3:18 PM

## 2021-06-29 ENCOUNTER — Other Ambulatory Visit: Payer: Self-pay

## 2021-06-29 LAB — COMPREHENSIVE METABOLIC PANEL
ALT: 9 IU/L (ref 0–32)
AST: 13 IU/L (ref 0–40)
Albumin/Globulin Ratio: 1.4 (ref 1.2–2.2)
Albumin: 4.4 g/dL (ref 3.8–4.8)
Alkaline Phosphatase: 102 IU/L (ref 44–121)
BUN/Creatinine Ratio: 24 (ref 12–28)
BUN: 22 mg/dL (ref 8–27)
Bilirubin Total: <0.2 mg/dL (ref 0.0–1.2)
CO2: 23 mmol/L (ref 20–29)
Calcium: 9.8 mg/dL (ref 8.7–10.3)
Chloride: 101 mmol/L (ref 96–106)
Creatinine, Ser: 0.93 mg/dL (ref 0.57–1.00)
Globulin, Total: 3.1 g/dL (ref 1.5–4.5)
Glucose: 77 mg/dL (ref 65–99)
Potassium: 4.6 mmol/L (ref 3.5–5.2)
Sodium: 139 mmol/L (ref 134–144)
Total Protein: 7.5 g/dL (ref 6.0–8.5)
eGFR: 67 mL/min/{1.73_m2} (ref >59)

## 2021-06-29 LAB — CBC WITH DIFFERENTIAL/PLATELET
Basophils Absolute: 0 10*3/uL (ref 0.0–0.2)
Basos: 1 %
EOS (ABSOLUTE): 0.2 10*3/uL (ref 0.0–0.4)
Eos: 3 %
Hematocrit: 37.8 % (ref 34.0–46.6)
Hemoglobin: 12.4 g/dL (ref 11.1–15.9)
Immature Grans (Abs): 0 10*3/uL (ref 0.0–0.1)
Immature Granulocytes: 0 %
Lymphocytes Absolute: 1.7 10*3/uL (ref 0.7–3.1)
Lymphs: 26 %
MCH: 29.8 pg (ref 26.6–33.0)
MCHC: 32.8 g/dL (ref 31.5–35.7)
MCV: 91 fL (ref 79–97)
Monocytes Absolute: 0.7 10*3/uL (ref 0.1–0.9)
Monocytes: 11 %
Neutrophils Absolute: 3.7 10*3/uL (ref 1.4–7.0)
Neutrophils: 59 %
Platelets: 201 10*3/uL (ref 150–450)
RBC: 4.16 x10E6/uL (ref 3.77–5.28)
RDW: 12.5 % (ref 11.7–15.4)
WBC: 6.3 10*3/uL (ref 3.4–10.8)

## 2021-06-29 LAB — LIPID PANEL
Chol/HDL Ratio: 4.4 ratio (ref 0.0–4.4)
Cholesterol, Total: 209 mg/dL — ABNORMAL HIGH (ref 100–199)
HDL: 47 mg/dL (ref >39)
LDL Chol Calc (NIH): 128 mg/dL — ABNORMAL HIGH (ref 0–99)
Triglycerides: 194 mg/dL — ABNORMAL HIGH (ref 0–149)
VLDL Cholesterol Cal: 34 mg/dL (ref 5–40)

## 2021-07-05 ENCOUNTER — Other Ambulatory Visit: Payer: Self-pay

## 2021-07-05 ENCOUNTER — Other Ambulatory Visit: Payer: Self-pay | Admitting: Physician Assistant

## 2021-07-05 MED ORDER — ATORVASTATIN CALCIUM 20 MG PO TABS
20.0000 mg | ORAL_TABLET | Freq: Every day | ORAL | 3 refills | Status: DC
  Filled 2021-07-05: qty 90, 90d supply, fill #0

## 2021-07-09 ENCOUNTER — Other Ambulatory Visit: Payer: Self-pay

## 2021-07-13 ENCOUNTER — Other Ambulatory Visit: Payer: Self-pay

## 2021-07-20 ENCOUNTER — Other Ambulatory Visit: Payer: Self-pay

## 2021-07-20 MED ORDER — BRIMONIDINE TARTRATE 0.2 % OP SOLN
1.0000 [drp] | Freq: Two times a day (BID) | OPHTHALMIC | 11 refills | Status: DC
  Filled 2021-07-20: qty 10, 40d supply, fill #0
  Filled 2022-01-10: qty 10, 37d supply, fill #0
  Filled 2022-01-11: qty 15, 75d supply, fill #0

## 2021-07-25 ENCOUNTER — Other Ambulatory Visit: Payer: Self-pay

## 2021-08-07 ENCOUNTER — Other Ambulatory Visit: Payer: Self-pay

## 2021-09-03 ENCOUNTER — Other Ambulatory Visit: Payer: Self-pay

## 2021-09-04 ENCOUNTER — Other Ambulatory Visit: Payer: Self-pay

## 2021-09-21 ENCOUNTER — Other Ambulatory Visit: Payer: Self-pay

## 2021-09-24 ENCOUNTER — Other Ambulatory Visit: Payer: Self-pay

## 2021-10-08 ENCOUNTER — Other Ambulatory Visit: Payer: Self-pay

## 2021-10-09 ENCOUNTER — Other Ambulatory Visit: Payer: Self-pay

## 2021-11-05 ENCOUNTER — Other Ambulatory Visit: Payer: Self-pay

## 2021-11-08 ENCOUNTER — Other Ambulatory Visit: Payer: Self-pay

## 2021-12-06 ENCOUNTER — Other Ambulatory Visit (HOSPITAL_COMMUNITY): Payer: Self-pay

## 2021-12-06 ENCOUNTER — Other Ambulatory Visit: Payer: Self-pay

## 2021-12-06 ENCOUNTER — Ambulatory Visit: Payer: Medicare (Managed Care) | Attending: Internal Medicine | Admitting: Internal Medicine

## 2021-12-06 VITALS — BP 114/80 | HR 103 | Wt 106.0 lb

## 2021-12-06 DIAGNOSIS — Z5320 Procedure and treatment not carried out because of patient's decision for unspecified reasons: Secondary | ICD-10-CM | POA: Diagnosis not present

## 2021-12-06 DIAGNOSIS — Z2821 Immunization not carried out because of patient refusal: Secondary | ICD-10-CM

## 2021-12-06 DIAGNOSIS — H42 Glaucoma in diseases classified elsewhere: Secondary | ICD-10-CM

## 2021-12-06 DIAGNOSIS — Z1231 Encounter for screening mammogram for malignant neoplasm of breast: Secondary | ICD-10-CM

## 2021-12-06 DIAGNOSIS — E1136 Type 2 diabetes mellitus with diabetic cataract: Secondary | ICD-10-CM | POA: Diagnosis not present

## 2021-12-06 DIAGNOSIS — Z8673 Personal history of transient ischemic attack (TIA), and cerebral infarction without residual deficits: Secondary | ICD-10-CM

## 2021-12-06 DIAGNOSIS — E782 Mixed hyperlipidemia: Secondary | ICD-10-CM

## 2021-12-06 DIAGNOSIS — F17200 Nicotine dependence, unspecified, uncomplicated: Secondary | ICD-10-CM | POA: Diagnosis not present

## 2021-12-06 DIAGNOSIS — L309 Dermatitis, unspecified: Secondary | ICD-10-CM | POA: Diagnosis not present

## 2021-12-06 DIAGNOSIS — E1139 Type 2 diabetes mellitus with other diabetic ophthalmic complication: Secondary | ICD-10-CM | POA: Diagnosis not present

## 2021-12-06 DIAGNOSIS — Z1211 Encounter for screening for malignant neoplasm of colon: Secondary | ICD-10-CM | POA: Diagnosis not present

## 2021-12-06 DIAGNOSIS — I10 Essential (primary) hypertension: Secondary | ICD-10-CM | POA: Diagnosis not present

## 2021-12-06 MED ORDER — AMLODIPINE BESYLATE 10 MG PO TABS
10.0000 mg | ORAL_TABLET | Freq: Every day | ORAL | 1 refills | Status: DC
  Filled 2021-12-06: qty 90, 90d supply, fill #0

## 2021-12-06 MED ORDER — TRIAMCINOLONE ACETONIDE 0.1 % EX CREA
1.0000 "application " | TOPICAL_CREAM | Freq: Two times a day (BID) | CUTANEOUS | 0 refills | Status: DC
  Filled 2021-12-06: qty 30, 15d supply, fill #0

## 2021-12-06 MED ORDER — TICAGRELOR 90 MG PO TABS
45.0000 mg | ORAL_TABLET | Freq: Two times a day (BID) | ORAL | 5 refills | Status: DC
  Filled 2021-12-06: qty 30, 30d supply, fill #0
  Filled 2022-01-07: qty 30, 30d supply, fill #1
  Filled 2022-02-07: qty 30, 30d supply, fill #2
  Filled 2022-03-04: qty 30, 30d supply, fill #3
  Filled 2022-04-04: qty 30, 30d supply, fill #4
  Filled 2022-05-05: qty 30, 30d supply, fill #5

## 2021-12-06 NOTE — Progress Notes (Signed)
Patient ID: Amy Howell, female   DOB: 04/13/1956, 66 y.o.   MRN: [id number] Virtual Visit via Telephone Note  I connected with Amy Howell on 12/06/2021 at 1:34 p.m by telephone and verified that I am speaking with the correct person using two identifiers  Location: Patient: home Provider: office  Participants: Myself Patient   I discussed the limitations, risks, security and privacy concerns of performing an evaluation and management service by telephone and the availability of in person appointments. I also discussed with the patient that there may be a patient responsible charge related to this service. The patient expressed understanding and agreed to proceed.   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 Estanislado Pandy),  glaucoma.  Last seen 06/2021 by PA.   Currently she is in [place].  She decided to do telephone visit because she is afraid of coming out and catching COVID.  HTN:  reports she is only taking Norvasc.  Not taking HCTZ "because I don't want to take all those medications."  Watching salt intake.  BP today was 114/80, yesterday was 145/77.  No CP/SOB/LE edema/HA/dizziness  HL: LDL 128 on labs done 06/2021.  She did not get lab results and recommendation to restart Lipitor.  Pt declines going back on statin stating that she will cut back on red meat. Reports compliance with Brilinta and ASA  DM:  no device to check BS. Reports she does well with eating habits. Due for A1C and urine microalbumin.  Pt does not think she has DM. Cancel eye appt because she was going to [place] at the time.  She will have her daughter reschedule with Dr. Schuyler Amor since she will have to take her to the appt. Hx of glaucoma/cataracts  Reports having a rash on arms and legs from new soap. Almost resolved but would like to have some cream to  use as needed. Skin very sensitive to soaps, detergeant and perfumes.   Tob dep: She has not smoked in 1 month.  However she tells me she will not say that she has quit completely but she has been trying.  HM: due for MMG and colon CA screening.  Would prefer to do Cologuard.  Reports having had flu shot already and at least 3 COVID vaccines.  Declines PCV and shingles vaccines  Outpatient Encounter Medications as of 12/06/2021  Medication Sig   acetaminophen (TYLENOL) 500 MG tablet Take 1,000 mg by mouth daily as needed (for pain.).    amLODipine (NORVASC) 10 MG tablet TAKE 1 TABLET (10 MG TOTAL) BY MOUTH DAILY.   aspirin 325 MG tablet Take 1 tablet (325 mg total) by mouth daily.   atorvastatin (LIPITOR) 20 MG tablet Take 1 tablet (20 mg total) by mouth daily.   brimonidine (ALPHAGAN) 0.2 % ophthalmic solution Instill 1 drop into both eyes twice a day   dorzolamide-timolol (COSOPT) 22.3-6.8 MG/ML ophthalmic solution INSTILL 1 DROP INTO BOTH EYES 2 TIMES A DAY   feeding supplement, ENSURE ENLIVE, (ENSURE ENLIVE) LIQD Take 237 mLs by mouth 2 (two) times daily.   hydrochlorothiazide (MICROZIDE) 12.5 MG capsule Take 1 capsule (12.5 mg total) by mouth daily.   ticagrelor (BRILINTA) 90 MG TABS tablet TAKE 1/2 TAB BY MOUTH TWICE DAILY   No facility-administered encounter medications on file as of 12/06/2021.      Observations/Objective:  Results for orders placed or performed in visit on 06/28/21  Comprehensive metabolic panel  Result Value Ref Range   Glucose 77 65 - 99 mg/dL   BUN 22 8 - 27 mg/dL   Creatinine, Ser 0.93 0.57 - 1.00 mg/dL   eGFR 67 >59 mL/min/1.73   BUN/Creatinine Ratio 24 12 - 28   Sodium 139 134 - 144 mmol/L   Potassium 4.6 3.5 - 5.2 mmol/L   Chloride 101 96 - 106 mmol/L   CO2 23 20 - 29 mmol/L   Calcium 9.8 8.7 - 10.3 mg/dL   Total Protein 7.5 6.0 - 8.5 g/dL   Albumin 4.4 3.8 - 4.8 g/dL   Globulin, Total 3.1 1.5 - 4.5 g/dL   Albumin/Globulin Ratio 1.4 1.2 - 2.2    Bilirubin Total <0.2 0.0 - 1.2 mg/dL   Alkaline Phosphatase 102 44 - 121 IU/L   AST 13 0 - 40 IU/L   ALT 9 0 - 32 IU/L  Lipid panel  Result Value Ref Range   Cholesterol, Total 209 (H) 100 - 199 mg/dL   Triglycerides 194 (H) 0 - 149 mg/dL   HDL 47 >39 mg/dL   VLDL Cholesterol Cal 34 5 - 40 mg/dL   LDL Chol Calc (NIH) 128 (H) 0 - 99 mg/dL   Chol/HDL Ratio 4.4 0.0 - 4.4 ratio  CBC with Differential/Platelet  Result Value Ref Range   WBC 6.3 3.4 - 10.8 x10E3/uL   RBC 4.16 3.77 - 5.28 x10E6/uL   Hemoglobin 12.4 11.1 - 15.9 g/dL   Hematocrit 37.8 34.0 - 46.6 %   MCV 91 79 - 97 fL   MCH 29.8 26.6 - 33.0 pg   MCHC 32.8 31.5 - 35.7 g/dL   RDW 12.5 11.7 - 15.4 %   Platelets 201 150 - 450 x10E3/uL   Neutrophils 59 Not Estab. %   Lymphs 26 Not Estab. %   Monocytes 11 Not Estab. %   Eos 3 Not Estab. %   Basos 1 Not Estab. %   Neutrophils Absolute 3.7 1.4 - 7.0 x10E3/uL   Lymphocytes Absolute 1.7 0.7 - 3.1 x10E3/uL   Monocytes Absolute 0.7 0.1 - 0.9 x10E3/uL   EOS (ABSOLUTE) 0.2 0.0 - 0.4 x10E3/uL   Basophils Absolute 0.0 0.0 - 0.2 x10E3/uL   Immature Granulocytes 0 Not Estab. %   Immature Grans (Abs) 0.0 0.0 - 0.1 x10E3/uL    Assessment and Plan: 1. 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  Encourage healthy eating habits and regular exercise. - Hemoglobin A1c; Future - Microalbumin / creatinine urine ratio; Future  2. Essential hypertension Blood pressure today is good.  Continue amlodipine.  Continue to limit salt in the foods. - amLODipine (NORVASC) 10 MG tablet; TAKE 1 TABLET (10 MG TOTAL) BY MOUTH DAILY.  Dispense: 90 tablet; Refill: 1  3. Mixed hyperlipidemia 4. Statin declined Discussed the importance of being on statin therapy given that she has had a stroke in the past.  Patient declines  5. Dermatitis Advised that she finds a detergent, soap and other body  products that do not cause rash and stick with the brand - triamcinolone cream (KENALOG) 0.1 %; Apply 1 application topically 2 (two) times daily.  Dispense: 30 g; Refill: 0  6. Tobacco dependence Commended her on not smoking in a month.  Encouraged her to remain tobacco free.  She is  aware of health risks associated with smoking.  Will reassess progress on subsequent visit.  7. Screening for colon cancer Patient agreeable to Cologuard. - Cologuard  8. Encounter for screening mammogram for malignant neoplasm of breast - MM Digital Screening; Future  9. History of CVA (cerebrovascular accident) - ticagrelor (BRILINTA) 90 MG TABS tablet; TAKE 1/2 TAB BY MOUTH TWICE DAILY  Dispense: 30 tablet; Refill: 5  10.  Decline pneumococcal vaccine Follow Up Instructions:   4 mths I discussed the assessment and treatment plan with the patient. The patient was provided an opportunity to ask questions and all were answered. The patient agreed with the plan and demonstrated an understanding of the instructions.   The patient was advised to call back or seek an in-person evaluation if the symptoms worsen or if the condition fails to improve as anticipated.  I  Spent 21 minutes on this telephone encounter  Karle Plumber, MD

## 2021-12-07 ENCOUNTER — Other Ambulatory Visit (HOSPITAL_COMMUNITY): Payer: Self-pay

## 2021-12-11 ENCOUNTER — Telehealth: Payer: Self-pay

## 2021-12-11 NOTE — Telephone Encounter (Signed)
-----   Message from Ladell Pier, MD sent at 12/06/2021  6:02 PM EST ----- F/u in 4 mths

## 2021-12-11 NOTE — Telephone Encounter (Signed)
Pt has been scheduled and reminder has been mailed.  

## 2021-12-13 ENCOUNTER — Other Ambulatory Visit: Payer: Self-pay

## 2021-12-13 NOTE — Telephone Encounter (Signed)
Patient states that she was given wrong Rx by pharmacy. The medication she was given was the "pill that had the back of mouth swollen up" .  She states the correct name was on the bottle but wrong pills in the bottle. She is unable to give numbers or letters on the pill at this time because daughter has the pills.    She has not been off amlodipine, has enough to last her this week.Marland Kitchen

## 2021-12-13 NOTE — Telephone Encounter (Signed)
Called patient's daughter at phone number listed in the chart.   Daughter states the number on the pill is [id number]. No other number on the other side.    Will forward message to Cheryle Horsfall PT and [facility] Pharmacist to f/u with patient.

## 2021-12-13 NOTE — Telephone Encounter (Signed)
Copied from [place] [id number]. Topic: Quick Communication - Rx Refill/Question >> Dec 13, 2021  2:04 PM [place], Apolonio Schneiders wrote: Amy Howell called to let Dr Wynetta Emery know the pt wanted to discuss a different medication instead of amLODipine (NORVASC) 10 MG tablet, please advise.

## 2022-01-07 ENCOUNTER — Other Ambulatory Visit (HOSPITAL_COMMUNITY): Payer: Self-pay

## 2022-01-10 ENCOUNTER — Other Ambulatory Visit: Payer: Self-pay

## 2022-01-10 MED FILL — Dorzolamide HCl-Timolol Maleate Ophth Soln 2-0.5%: OPHTHALMIC | 37 days supply | Qty: 10 | Fill #0 | Status: CN

## 2022-01-11 ENCOUNTER — Other Ambulatory Visit (HOSPITAL_COMMUNITY): Payer: Self-pay

## 2022-01-11 ENCOUNTER — Other Ambulatory Visit: Payer: Self-pay

## 2022-01-11 MED FILL — Dorzolamide HCl-Timolol Maleate Ophth Soln 2-0.5%: OPHTHALMIC | 50 days supply | Qty: 10 | Fill #0 | Status: AC

## 2022-01-14 ENCOUNTER — Other Ambulatory Visit: Payer: Self-pay

## 2022-01-14 ENCOUNTER — Other Ambulatory Visit (HOSPITAL_COMMUNITY): Payer: Self-pay

## 2022-01-15 ENCOUNTER — Other Ambulatory Visit (HOSPITAL_COMMUNITY): Payer: Self-pay

## 2022-01-15 MED ORDER — LATANOPROST 0.005 % OP SOLN
1.0000 [drp] | Freq: Every day | OPHTHALMIC | 3 refills | Status: DC
  Filled 2022-01-15: qty 7.5, 75d supply, fill #0
  Filled 2022-02-25: qty 7.5, 75d supply, fill #1

## 2022-01-23 ENCOUNTER — Other Ambulatory Visit (HOSPITAL_COMMUNITY): Payer: Self-pay

## 2022-01-23 MED ORDER — DORZOLAMIDE HCL 2 % OP SOLN
1.0000 [drp] | Freq: Two times a day (BID) | OPHTHALMIC | 0 refills | Status: DC
  Filled 2022-01-23: qty 10, 50d supply, fill #0

## 2022-01-23 MED ORDER — TIMOLOL MALEATE 0.5 % OP SOLN
1.0000 [drp] | Freq: Every morning | OPHTHALMIC | 0 refills | Status: DC
  Filled 2022-01-23: qty 5, 50d supply, fill #0
  Filled 2022-02-25: qty 5, 50d supply, fill #1

## 2022-01-23 MED ORDER — BRIMONIDINE TARTRATE 0.2 % OP SOLN
1.0000 [drp] | Freq: Two times a day (BID) | OPHTHALMIC | 0 refills | Status: DC
  Filled 2022-01-23: qty 10, 50d supply, fill #0

## 2022-01-23 MED ORDER — LATANOPROST 0.005 % OP SOLN
1.0000 [drp] | Freq: Every day | OPHTHALMIC | 0 refills | Status: DC
  Filled 2022-01-23: qty 7.5, 150d supply, fill #0

## 2022-01-24 ENCOUNTER — Other Ambulatory Visit (HOSPITAL_COMMUNITY): Payer: Self-pay

## 2022-02-07 ENCOUNTER — Other Ambulatory Visit (HOSPITAL_COMMUNITY): Payer: Self-pay

## 2022-02-19 ENCOUNTER — Other Ambulatory Visit (HOSPITAL_COMMUNITY): Payer: Self-pay

## 2022-02-19 DIAGNOSIS — H401123 Primary open-angle glaucoma, left eye, severe stage: Secondary | ICD-10-CM | POA: Diagnosis not present

## 2022-02-19 DIAGNOSIS — H2513 Age-related nuclear cataract, bilateral: Secondary | ICD-10-CM | POA: Diagnosis not present

## 2022-02-19 DIAGNOSIS — I639 Cerebral infarction, unspecified: Secondary | ICD-10-CM | POA: Diagnosis not present

## 2022-02-19 DIAGNOSIS — H401111 Primary open-angle glaucoma, right eye, mild stage: Secondary | ICD-10-CM | POA: Diagnosis not present

## 2022-02-19 MED ORDER — DORZOLAMIDE HCL 2 % OP SOLN
1.0000 [drp] | Freq: Two times a day (BID) | OPHTHALMIC | 3 refills | Status: DC
  Filled 2022-02-19: qty 10, 100d supply, fill #0
  Filled 2022-02-25: qty 30, 50d supply, fill #0
  Filled 2022-02-25: qty 10, 50d supply, fill #0
  Filled 2022-07-10: qty 10, 50d supply, fill #1
  Filled 2022-10-28: qty 10, 50d supply, fill #2

## 2022-02-19 MED ORDER — LATANOPROST 0.005 % OP SOLN
1.0000 [drp] | Freq: Every day | OPHTHALMIC | 3 refills | Status: AC
  Filled 2022-02-19: qty 2.5, 50d supply, fill #0
  Filled 2022-02-25: qty 22.5, 75d supply, fill #0
  Filled 2022-10-28: qty 7.5, 75d supply, fill #0

## 2022-02-19 MED ORDER — TIMOLOL MALEATE 0.5 % OP SOLN
1.0000 [drp] | Freq: Every morning | OPHTHALMIC | 3 refills | Status: DC
  Filled 2022-02-19: qty 5, 50d supply, fill #0
  Filled 2022-02-25: qty 45, 50d supply, fill #0

## 2022-02-25 ENCOUNTER — Other Ambulatory Visit (HOSPITAL_COMMUNITY): Payer: Self-pay

## 2022-02-26 ENCOUNTER — Other Ambulatory Visit (HOSPITAL_COMMUNITY): Payer: Self-pay

## 2022-03-04 ENCOUNTER — Other Ambulatory Visit (HOSPITAL_COMMUNITY): Payer: Self-pay

## 2022-03-05 ENCOUNTER — Other Ambulatory Visit (HOSPITAL_COMMUNITY): Payer: Self-pay

## 2022-04-04 ENCOUNTER — Other Ambulatory Visit (HOSPITAL_COMMUNITY): Payer: Self-pay

## 2022-04-12 ENCOUNTER — Ambulatory Visit: Payer: Medicare (Managed Care) | Attending: Internal Medicine | Admitting: Internal Medicine

## 2022-04-12 ENCOUNTER — Encounter: Payer: Self-pay | Admitting: Internal Medicine

## 2022-04-12 VITALS — BP 200/90 | HR 78 | Ht 61.0 in | Wt 105.8 lb

## 2022-04-12 DIAGNOSIS — E1139 Type 2 diabetes mellitus with other diabetic ophthalmic complication: Secondary | ICD-10-CM

## 2022-04-12 DIAGNOSIS — H42 Glaucoma in diseases classified elsewhere: Secondary | ICD-10-CM

## 2022-04-12 DIAGNOSIS — E1136 Type 2 diabetes mellitus with diabetic cataract: Secondary | ICD-10-CM | POA: Diagnosis not present

## 2022-04-12 DIAGNOSIS — Z5320 Procedure and treatment not carried out because of patient's decision for unspecified reasons: Secondary | ICD-10-CM | POA: Diagnosis not present

## 2022-04-12 DIAGNOSIS — I10 Essential (primary) hypertension: Secondary | ICD-10-CM

## 2022-04-12 DIAGNOSIS — Z1231 Encounter for screening mammogram for malignant neoplasm of breast: Secondary | ICD-10-CM | POA: Diagnosis not present

## 2022-04-12 DIAGNOSIS — F17200 Nicotine dependence, unspecified, uncomplicated: Secondary | ICD-10-CM

## 2022-04-12 DIAGNOSIS — Z1211 Encounter for screening for malignant neoplasm of colon: Secondary | ICD-10-CM

## 2022-04-12 LAB — POCT GLYCOSYLATED HEMOGLOBIN (HGB A1C): HbA1c, POC (controlled diabetic range): 5.7 % (ref 0.0–7.0)

## 2022-04-12 MED ORDER — AMLODIPINE BESYLATE 10 MG PO TABS: 10.0000 mg | ORAL_TABLET | Freq: Every day | ORAL | 1 refills | Status: DC

## 2022-04-12 NOTE — Progress Notes (Signed)
? ? ?Patient ID: Amy Howell, female    DOB: 12-28-1955  MRN: [id number] ? ?CC: Diabetes ? ? ?Subjective: ?Amy Howell is a 66 y.o. 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 Estanislado Pandy),  glaucoma.  ? ?HTN:  she stopped the Norvasc about 1 mth ago.  Reports the last RF she got, the pills looked different than before.  The "new one" was causing her to have CP.  She informed the pharmacy and was told that the pills looked different because it came from a different manufacturer. ?-she does not want to take medications. Prefers to take some "type of tea or bush or something."  She tells me that she is tired of being a [place] pig. ?-no HA/dizziness/SOB/LE edema ?-not checking BP but has device ?-trying to limit salt ? ?DM ?A1C 5.7 ?Does not check BS.  ?Snacks on Kisses chocolate at nights.  Drinks water and Ensure.   ?Reports having had eye exam done at [facility] the end of last mth.  She has glaucoma and is on 3 eyedrops which she has with her today.  Eyedrops that she is on are timolol, Xalatan, and Trusopt.  She also sees him for cataract. ?Still not interested in being statin therapy for HL ? ?Tob dep: still smoking but light.  Current pack she has had for 3 wks. Feels she is almost ready to give it up.  Not wanting any help to quit. ? ?HM:  MMG ordered on last visit.  States she depends on daughter to take her to appt. . Cologuard ordered on last visit.  She received it.  She changed her mind about doing the cologuard.  Wants to do c-scope instead.  Declines Dexa scan.  Due for microalbumin.  She declines any lab work today stating that she got a bill as she is still paying off from last set of labs. ?Patient Active Problem List  ? Diagnosis Date Noted  ? Statin declined 12/06/2021  ? Colon  cancer screening declined 07/15/2019  ? Mammogram declined 07/15/2019  ? 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 Tobacco dependence 03/30/2018  ? Glaucoma of both eyes 03/30/2018  ? History of loop recorder 03/30/2018  ? Prediabetes 04/10/2017  ? Cryptogenic stroke ([place])   ? Aneurysm of middle cerebral artery   ? Weight loss, unintentional   ? Basilar artery stenosis   ? Essential hypertension 03/26/2017  ?  ? ?Current Outpatient Medications on File Prior to Visit  ?Medication Sig Dispense Refill  ? aspirin 325 MG tablet Take 1 tablet (325 mg total) by mouth daily. 90 tablet 3  ? brimonidine (ALPHAGAN) 0.2 % ophthalmic solution Instill 1 drop into both eyes twice a day 15 mL 11  ? dorzolamide (TRUSOPT) 2 % ophthalmic solution Instill 1 drop into both eyes twice a day 30 mL 3  ? feeding supplement, ENSURE ENLIVE, (ENSURE ENLIVE) LIQD Take 237 mLs by mouth 2 (two) times daily.    ? latanoprost (XALATAN) 0.005 % ophthalmic solution Instill 1 drop into both eyes at bedtime 22.5 mL 3  ? ticagrelor (BRILINTA) 90 MG TABS tablet Take 0.5 tablets (45 mg total) by mouth 2 (two) times daily. 30 tablet 5  ? timolol (BETIMOL) 0.5 % ophthalmic solution  1 drop 2 (two) times daily.    ? acetaminophen (TYLENOL) 500 MG tablet Take 1,000 mg by mouth daily as needed (for pain.).  (Patient not taking: Reported on 04/12/2022)    ? triamcinolone cream (KENALOG) 0.1 % Apply 1 application topically 2 (two) times daily. (Patient not taking: Reported on 04/12/2022) 30 g 0  ? ?No current facility-administered medications on file prior to visit.  ? ? ?Allergies  ?Allergen Reactions  ? Lisinopril Other (See Comments)  ?  Angioedema  ? ? ?Social History  ? ?Socioeconomic History  ? Marital status: Single  ?  Spouse name: Not on file  ? Number of children: Not on file  ? Years of education: Not on file  ? Highest education level: Not on file  ?Occupational History  ? Not on file  ?Tobacco Use  ? Smoking status: Every  Day  ?  Packs/day: 0.25  ?  Types: Cigarettes  ? Smokeless tobacco: Never  ? Tobacco comments:  ?  1 cigarette a day   ?Vaping Use  ? Vaping Use: Never used  ?Substance and Sexual Activity  ? Alcohol use: No  ? Drug use: No  ? Sexual activity: Not on file  ?Other Topics Concern  ? Not on file  ?Social History Narrative  ? Not on file  ? ?Social Determinants of Health  ? ?Financial Resource Strain: Not on file  ?Food Insecurity: Not on file  ?Transportation Needs: Not on file  ?Physical Activity: Not on file  ?Stress: Not on file  ?Social Connections: Not on file  ?Intimate Partner Violence: Not on file  ? ? ?Family History  ?Problem Relation Age of Onset  ? Hypertension Father   ? Diabetes Father   ? ? ?Past Surgical History:  ?Procedure Laterality Date  ? ABDOMINAL HYSTERECTOMY    ? CARDIAC DEFIBRILLATOR PLACEMENT    ? gum operation    ? Implantable loop recorder removal  02/01/2019  ? MDT Reveal LINQ explantation in office by JA  ? IR 3D INDEPENDENT WKST  05/07/2017  ? IR ANGIO INTRA EXTRACRAN SEL COM CAROTID INNOMINATE BILAT MOD SED  04/23/2017  ? IR ANGIO INTRA EXTRACRAN SEL COM CAROTID INNOMINATE BILAT MOD SED  08/06/2017  ? IR ANGIO INTRA EXTRACRAN SEL COM CAROTID INNOMINATE UNI R MOD SED  05/07/2017  ? IR ANGIO VERTEBRAL SEL VERTEBRAL BILAT MOD SED  04/23/2017  ? IR ANGIO VERTEBRAL SEL VERTEBRAL BILAT MOD SED  08/06/2017  ? IR ANGIOGRAM FOLLOW UP STUDY  05/07/2017  ? IR ANGIOGRAM SELECTIVE EACH ADDITIONAL VESSEL  05/07/2017  ? IR RADIOLOGIST EVAL & MGMT  04/16/2017  ? IR RADIOLOGIST EVAL & MGMT  05/29/2017  ? IR TRANSCATH/EMBOLIZ  05/07/2017  ? LOOP RECORDER INSERTION N/A 03/28/2017  ? Procedure: Loop Recorder Insertion;  Surgeon: Thompson Grayer, MD;  Location: [facility];  Service: Cardiovascular;  Laterality: N/A;  ? RADIOLOGY WITH ANESTHESIA N/A 05/07/2017  ? Procedure: EMBOLIZATION;  Surgeon: Luanne Bras, MD;  Location: [facility];  Service: Radiology;  Laterality: N/A;  ? RADIOLOGY WITH ANESTHESIA N/A 08/06/2017  ?  Procedure: RADIOLOGY WITH ANESTHESIA STENTING;  Surgeon: Luanne Bras, MD;  Location: [facility];  Service: Radiology;  Laterality: N/A;  ? TEE WITHOUT CARDIOVERSION N/A 03/28/2017  ? Procedure: TRANSESOPHAGEAL ECHOCARDIOGRAM (TEE);  Surgeon: Lelon Perla, MD;  Location: [facility];  Service: Cardiovascular;  Laterality: N/A;  ? ? ?ROS: ?Review of Systems ?Negative except as stated above ? ?PHYSICAL EXAM: ?BP (!) 200/90  Pulse 78   Ht '5\' 1"'$  (1.549 m)   Wt 105 lb 12.8 oz (48 kg)   SpO2 98%   BMI 19.99 kg/m?   ?Wt Readings from Last 3 Encounters:  ?04/12/22 105 lb 12.8 oz (48 kg)  ?12/06/21 106 lb (48.1 kg)  ?06/28/21 99 lb 12.8 oz (45.3 kg)  ? ? ?Physical Exam ? ?General appearance - alert, well appearing, older African-American female and in no distress ?Mental status - normal mood, behavior, speech, dress, motor activity, and thought processes ?Neck - supple, no significant adenopathy ?Chest - clear to auscultation, no wheezes, rales or rhonchi, symmetric air entry ?Heart - normal rate, regular rhythm, normal S1, S2, no murmurs, rubs, clicks or gallops ?Extremities - peripheral pulses normal, no pedal edema, no clubbing or cyanosis ?Feet: No ulcers or callus. ? ? ?  Latest Ref Rng & Units 06/28/2021  ?  3:25 PM 07/15/2019  ? 11:03 AM 08/20/2018  ? 10:58 AM  ?CMP  ?Glucose 65 - 99 mg/dL 77   97   119    ?BUN 8 - 27 mg/dL '22   17   15    '$ ?Creatinine 0.57 - 1.00 mg/dL 0.93   1.01   0.95    ?Sodium 134 - 144 mmol/L 139   140   140    ?Potassium 3.5 - 5.2 mmol/L 4.6   4.7   4.0    ?Chloride 96 - 106 mmol/L 101   102   105    ?CO2 20 - 29 mmol/L '23   24   22    '$ ?Calcium 8.7 - 10.3 mg/dL 9.8   9.8   9.5    ?Total Protein 6.0 - 8.5 g/dL 7.5   8.4     ?Total Bilirubin 0.0 - 1.2 mg/dL <0.2   0.3     ?Alkaline Phos 44 - 121 IU/L 102   122     ?AST 0 - 40 IU/L 13   22     ?ALT 0 - 32 IU/L 9   18     ? ?Lipid Panel  ?   ?Component Value Date/Time  ? CHOL 209 (H) 06/28/2021 1525  ? TRIG 194 (H) 06/28/2021 1525  ? HDL 47  06/28/2021 1525  ? CHOLHDL 4.4 06/28/2021 1525  ? CHOLHDL 4.6 03/27/2017 1056  ? VLDL 17 03/27/2017 1056  ? LDLCALC 128 (H) 06/28/2021 1525  ? ? ?CBC ?   ?Component Value Date/Time  ? WBC 6.3 06/28/2021 1525

## 2022-05-06 ENCOUNTER — Other Ambulatory Visit (HOSPITAL_COMMUNITY): Payer: Self-pay

## 2022-05-09 ENCOUNTER — Ambulatory Visit
Admission: RE | Admit: 2022-05-09 | Discharge: 2022-05-09 | Disposition: A | Discharge status: Home / Self Care | Payer: Medicare (Managed Care) | Source: Ambulatory Visit | Attending: Internal Medicine | Admitting: Internal Medicine

## 2022-05-09 DIAGNOSIS — Z1231 Encounter for screening mammogram for malignant neoplasm of breast: Secondary | ICD-10-CM

## 2022-05-09 NOTE — Progress Notes (Signed)
Let patient know that the radiologist saw an area of concern in the right breast.  They will be contacting her to return to have additional images of the right breast to evaluate this area further.  If she does not hear from them in several days, she should call them to schedule an appointment for the additional images.

## 2022-05-10 ENCOUNTER — Other Ambulatory Visit: Payer: Self-pay | Admitting: Internal Medicine

## 2022-05-10 DIAGNOSIS — R928 Other abnormal and inconclusive findings on diagnostic imaging of breast: Secondary | ICD-10-CM

## 2022-05-20 ENCOUNTER — Other Ambulatory Visit: Payer: Self-pay | Admitting: Internal Medicine

## 2022-05-20 ENCOUNTER — Ambulatory Visit
Admission: RE | Admit: 2022-05-20 | Discharge: 2022-05-20 | Disposition: A | Discharge status: Home / Self Care | Payer: Medicare (Managed Care) | Source: Ambulatory Visit | Attending: Internal Medicine | Admitting: Internal Medicine

## 2022-05-20 DIAGNOSIS — N6313 Unspecified lump in the right breast, lower outer quadrant: Secondary | ICD-10-CM | POA: Diagnosis not present

## 2022-05-20 DIAGNOSIS — R928 Other abnormal and inconclusive findings on diagnostic imaging of breast: Secondary | ICD-10-CM

## 2022-05-20 DIAGNOSIS — N6314 Unspecified lump in the right breast, lower inner quadrant: Secondary | ICD-10-CM | POA: Diagnosis not present

## 2022-05-20 DIAGNOSIS — N6310 Unspecified lump in the right breast, unspecified quadrant: Secondary | ICD-10-CM

## 2022-05-20 DIAGNOSIS — R922 Inconclusive mammogram: Secondary | ICD-10-CM | POA: Diagnosis not present

## 2022-05-26 ENCOUNTER — Telehealth: Payer: Self-pay | Admitting: Internal Medicine

## 2022-05-29 ENCOUNTER — Telehealth: Payer: Self-pay | Admitting: Internal Medicine

## 2022-05-29 NOTE — Telephone Encounter (Signed)
PC placed to pt today. Advised pt that I was calling to let her know I received the results of her MMG.  Bx was recommended.  Pt states she is aware and plans to keep appt 06/03/2022 for RT breast biopsy.

## 2022-06-03 ENCOUNTER — Ambulatory Visit
Admission: RE | Admit: 2022-06-03 | Discharge: 2022-06-03 | Disposition: A | Discharge status: Home / Self Care | Payer: Medicare (Managed Care) | Source: Ambulatory Visit | Attending: Internal Medicine | Admitting: Internal Medicine

## 2022-06-03 DIAGNOSIS — D0511 Intraductal carcinoma in situ of right breast: Secondary | ICD-10-CM | POA: Diagnosis not present

## 2022-06-03 DIAGNOSIS — N6310 Unspecified lump in the right breast, unspecified quadrant: Secondary | ICD-10-CM

## 2022-06-03 DIAGNOSIS — C50311 Malignant neoplasm of lower-inner quadrant of right female breast: Secondary | ICD-10-CM | POA: Diagnosis not present

## 2022-06-03 DIAGNOSIS — N6315 Unspecified lump in the right breast, overlapping quadrants: Secondary | ICD-10-CM | POA: Diagnosis not present

## 2022-06-03 DIAGNOSIS — N6314 Unspecified lump in the right breast, lower inner quadrant: Secondary | ICD-10-CM | POA: Diagnosis not present

## 2022-06-03 DIAGNOSIS — Z170 Estrogen receptor positive status [ER+]: Secondary | ICD-10-CM | POA: Diagnosis not present

## 2022-06-06 ENCOUNTER — Other Ambulatory Visit (HOSPITAL_COMMUNITY): Payer: Self-pay

## 2022-06-06 ENCOUNTER — Other Ambulatory Visit: Payer: Self-pay | Admitting: Internal Medicine

## 2022-06-06 ENCOUNTER — Telehealth: Payer: Self-pay | Admitting: Internal Medicine

## 2022-06-06 DIAGNOSIS — Z8673 Personal history of transient ischemic attack (TIA), and cerebral infarction without residual deficits: Secondary | ICD-10-CM

## 2022-06-06 MED ORDER — TICAGRELOR 90 MG PO TABS
45.0000 mg | ORAL_TABLET | Freq: Two times a day (BID) | ORAL | 5 refills | Status: DC
  Filled 2022-06-06: qty 30, 30d supply, fill #0
  Filled 2022-07-10: qty 30, 30d supply, fill #1
  Filled 2022-08-08: qty 30, 30d supply, fill #2
  Filled 2022-09-03: qty 30, 30d supply, fill #3
  Filled 2022-10-04: qty 30, 30d supply, fill #4
  Filled 2022-11-10: qty 30, 30d supply, fill #5

## 2022-06-06 NOTE — Telephone Encounter (Signed)
Phone call placed to patient today to go over the results of breast biopsy.  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  Patient tells me that the radiologist called and discussed the results with her daughter and she has an upcoming appointment with the cancer center on 06/12/2022 which she intends to keep.  She expresses no further concerns and had no questions.

## 2022-06-07 ENCOUNTER — Other Ambulatory Visit (HOSPITAL_COMMUNITY): Payer: Self-pay

## 2022-06-07 ENCOUNTER — Telehealth: Payer: Self-pay | Admitting: Hematology and Oncology

## 2022-06-07 NOTE — Telephone Encounter (Signed)
Spoke with patients daughter to confirm morning clinic appointment for 7/12, sending paperwork via mail

## 2022-06-10 ENCOUNTER — Encounter: Payer: Self-pay | Admitting: *Deleted

## 2022-06-10 DIAGNOSIS — Z170 Estrogen receptor positive status [ER+]: Secondary | ICD-10-CM

## 2022-06-10 DIAGNOSIS — C50311 Malignant neoplasm of lower-inner quadrant of right female breast: Secondary | ICD-10-CM | POA: Insufficient documentation

## 2022-06-10 NOTE — Progress Notes (Signed)
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   HISTORY OF PRESENT ILLNESS: Amy Howell is a 66 y.o. female seen in the multidisciplinary breast clinic for a new diagnosis of right breast cancer. The patient was noted to have a screening detected mass in the right breast.  By diagnostic ultrasound on 05/20/2022 that showed 2 separate areas, in the 4 o'clock position of the right breast was a mass measuring 1.4 cm corresponding to architectural distortion on mammogram, and a separate mass in the 6 o'clock position measuring 2.4 cm, and her right axilla was evaluated with no evidence of abnormal appearing lymph nodes.  She underwent a biopsy on 06/03/2022 of both lesions, the 4:00 mass showed invasive lobular carcinoma and LCIS, grade 2, the second mass in the 6 o'clock position showed intermediate grade DCIS but focally suspicious for microinvasion.  Her invasive specimen was ER/PR positive, HER2 negative, with a Ki-67 of 5%.  She is seen today to discuss treatment recommendations of her cancer.    PREVIOUS RADIATION THERAPY: {EXAM; YES/NO:19492::"No"}   PAST MEDICAL HISTORY:  Past Medical History:  Diagnosis Date   Cataracts, bilateral    Cerebral infarction due to carotid artery stenosis (HCC)    Glaucoma    Headache    Hypertension    Stroke (HCC)    no residual, "series of mini strokes"   Vertigo        PAST SURGICAL HISTORY: Past Surgical History:  Procedure Laterality Date   ABDOMINAL HYSTERECTOMY     CARDIAC DEFIBRILLATOR PLACEMENT     gum operation     Implantable loop recorder removal  02/01/2019   MDT Reveal LINQ explantation in office by JA    IR 3D INDEPENDENT WKST  05/07/2017   IR ANGIO INTRA EXTRACRAN SEL COM CAROTID INNOMINATE BILAT MOD SED  04/23/2017   IR ANGIO INTRA EXTRACRAN SEL COM CAROTID INNOMINATE BILAT MOD SED  08/06/2017   IR ANGIO INTRA EXTRACRAN SEL COM CAROTID INNOMINATE UNI R MOD SED  05/07/2017   IR ANGIO VERTEBRAL SEL VERTEBRAL BILAT MOD SED  04/23/2017   IR ANGIO VERTEBRAL SEL VERTEBRAL BILAT MOD SED  08/06/2017   IR ANGIOGRAM FOLLOW UP STUDY  05/07/2017   IR ANGIOGRAM SELECTIVE EACH ADDITIONAL VESSEL  05/07/2017   IR RADIOLOGIST EVAL & MGMT  04/16/2017   IR RADIOLOGIST EVAL & MGMT  05/29/2017   IR TRANSCATH/EMBOLIZ  05/07/2017   LOOP RECORDER INSERTION N/A 03/28/2017   Procedure: Loop Recorder Insertion;  Surgeon: Thompson Grayer, MD;  Location: [facility];  Service: Cardiovascular;  Laterality: N/A;   RADIOLOGY WITH ANESTHESIA N/A 05/07/2017   Procedure: EMBOLIZATION;  Surgeon: Luanne Bras, MD;  Location: [facility];  Service: Radiology;  Laterality: N/A;   RADIOLOGY WITH ANESTHESIA N/A 08/06/2017   Procedure: RADIOLOGY WITH ANESTHESIA STENTING;  Surgeon: Luanne Bras, MD;  Location: [facility];  Service: Radiology;  Laterality: N/A;   TEE WITHOUT CARDIOVERSION N/A 03/28/2017   Procedure: TRANSESOPHAGEAL ECHOCARDIOGRAM (TEE);  Surgeon: Lelon Perla, MD;  Location: [facility];  Service: Cardiovascular;  Laterality: N/A;     FAMILY HISTORY:  Family History  Problem Relation Age of  Onset   Hypertension Father    Diabetes Father    Breast cancer Neg Hx      SOCIAL HISTORY:  reports that she has been smoking cigarettes. She has been smoking an average of .25 packs per day. She has never used smokeless tobacco. She reports that she does not drink alcohol and does not use drugs.   ALLERGIES: Lisinopril   MEDICATIONS:  Current Outpatient Medications  Medication Sig Dispense Refill   acetaminophen (TYLENOL) 500 MG tablet Take 1,000 mg by mouth daily as needed (for pain.).  (Patient not taking: Reported on  04/12/2022)     amLODipine (NORVASC) 10 MG tablet Take 1 tablet (10 mg total) by mouth daily. 90 tablet 1   aspirin 325 MG tablet Take 1 tablet (325 mg total) by mouth daily. 90 tablet 3   brimonidine (ALPHAGAN) 0.2 % ophthalmic solution Instill 1 drop into both eyes twice a day 15 mL 11   dorzolamide (TRUSOPT) 2 % ophthalmic solution Instill 1 drop into both eyes twice a day 30 mL 3   feeding supplement, ENSURE ENLIVE, (ENSURE ENLIVE) LIQD Take 237 mLs by mouth 2 (two) times daily.     latanoprost (XALATAN) 0.005 % ophthalmic solution Instill 1 drop into both eyes at bedtime 22.5 mL 3   ticagrelor (BRILINTA) 90 MG TABS tablet Take 0.5 tablets (45 mg total) by mouth 2 (two) times daily. 30 tablet 5   timolol (BETIMOL) 0.5 % ophthalmic solution 1 drop 2 (two) times daily.     triamcinolone cream (KENALOG) 0.1 % Apply 1 application topically 2 (two) times daily. (Patient not taking: Reported on 04/12/2022) 30 g 0   No current facility-administered medications for this visit.     REVIEW OF SYSTEMS: On review of systems, the patient reports that she is doing ***     PHYSICAL EXAM:  Wt Readings from Last 3 Encounters:  04/12/22 105 lb 12.8 oz (48 kg)  12/06/21 106 lb (48.1 kg)  06/28/21 99 lb 12.8 oz (45.3 kg)   Temp Readings from Last 3 Encounters:  10/31/20 (!) 97 F (36.1 C)  07/15/19 98.2 F (36.8 C) (Oral)  01/08/19 97.9 F (36.6 C) (Oral)   BP Readings from Last 3 Encounters:  04/12/22 (!) 200/90  12/06/21 114/80  06/28/21 (!) 175/63   Pulse Readings from Last 3 Encounters:  04/12/22 78  12/06/21 (!) 103  06/28/21 67    In general this is a well appearing *** female in no acute distress. She's alert and oriented x4 and appropriate throughout the examination. Cardiopulmonary assessment is negative for acute distress and she exhibits normal effort. Bilateral breast exam is deferred.    ECOG = ***  0 - Asymptomatic (Fully active, able to carry on all predisease  activities without restriction)  1 - Symptomatic but completely ambulatory (Restricted in physically strenuous activity but ambulatory and able to carry out work of a light or sedentary nature. For example, light housework, office work)  2 - Symptomatic, <50% in bed during the day (Ambulatory and capable of all self care but unable to carry out any work activities. Up and about more than 50% of waking hours)  3 - Symptomatic, >50% in bed, but not bedbound (Capable of only limited self-care, confined to bed or chair 50% or more of waking hours)  4 - Bedbound (Completely disabled. Cannot carry on any self-care. Totally confined to bed or chair)  5 - Death   Lolita Rieger, Creech RH, Tormey DC, et  al. [phone]). "Toxicity and response criteria of the [facility]". [place] Oncol. 5 (6): 649-55    LABORATORY DATA:  Lab Results  Component Value Date   WBC 6.3 06/28/2021   HGB 12.4 06/28/2021   HCT 37.8 06/28/2021   MCV 91 06/28/2021   PLT 201 06/28/2021   Lab Results  Component Value Date   NA 139 06/28/2021   K 4.6 06/28/2021   CL 101 06/28/2021   CO2 23 06/28/2021   Lab Results  Component Value Date   ALT 9 06/28/2021   AST 13 06/28/2021   ALKPHOS 102 06/28/2021   BILITOT <0.2 06/28/2021      RADIOGRAPHY: [place] RT BREAST BX W LOC DEV 1ST LESION IMG BX SPEC US GUIDE  Addendum Date: 06/06/2022   ADDENDUM REPORT: 06/06/2022 15:35 ADDENDUM: Pathology revealed Breast, RIGHT, needle core biopsy, lower inner, 4 o'clock position, 2cmfn, (ribbon clip) - GRADE 2 INVASIVE LOBULAR CARCINOMA, (AN E-CADHERIN STAIN SHOWS LOSS OF MEMBRANOUS STAINING SUPPORTING THE DIAGNOSIS), 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 This was found to be concordant by Dr. Hassan Rowan. Pathology revealed Breast, RIGHT, needle core biopsy, lower, 6 o'clock position, 3cmfn, (coil clip)- DUCTAL CARCINOMA IN SITU, GRADE 2 OF 3, FOCALLY SUSPICIOUS FOR MICROINVASION, CALCIFICATIONS: PRESENT.  This was found to be concordant by Dr. Hassan Rowan. Pathology results were discussed with the patient's daughter Ellesse Antenucci) by telephone. The patient reported doing well after the biopsies with tenderness at the sites. Post biopsy instructions and care were reviewed and questions were answered. The patient was encouraged to call [facility] for any additional concerns. The patient was referred to [facility] on June 12, 2022. Consider breast MRI given lobular histology. Pathology results reported by Stacie Acres RN on 06/06/2022. Electronically Signed   By: Margarette Canada M.D.   On: 06/06/2022 15:35   Result Date: 06/06/2022 CLINICAL DATA:  66 year old female for tissue sampling of 1.4 cm LOWER INNER RIGHT breast mass (4 o'clock position) and 2.4 cm LOWER RIGHT breast mass (6 o'clock position). The 0.4 x 0.5 cm mass at the 3 o'clock position of the RIGHT breast 2 cm from the nipple on the 05/20/2022 ultrasound images is abutting the 4 o'clock mass and therefore not sampled. EXAM: ULTRASOUND GUIDED RIGHT BREAST CORE NEEDLE BIOPSY X 2 COMPARISON:  None Available. PROCEDURE: I met with the patient and we discussed the procedure of ultrasound-guided biopsy, including benefits and alternatives. We discussed the high likelihood of successful procedures. We discussed the risks of the procedures, including infection, bleeding, tissue injury, clip migration, and inadequate sampling. Informed written consent was given. The usual time-out protocol was performed immediately prior to the procedures. ULTRASOUND GUIDED RIGHT BREAST CORE NEEDLE BIOPSY #1 (1.4 cm 4 o'clock position mass-RIBBON clip): Using sterile technique and 1% Lidocaine with and without epinephrine as local anesthetic, under direct ultrasound visualization, a 14 gauge spring-loaded device was used to perform biopsy of the 1.4 cm mass at the 4 o'clock position of  the RIGHT breast using a superolateral approach. At the conclusion of the procedure a RIBBON shaped tissue marker clip was deployed into the biopsy cavity. Follow up 2 view mammogram was performed and dictated separately. ULTRASOUND GUIDED RIGHT BREAST CORE NEEDLE BIOPSY #2 (2.4 cm 6 o'clock position mass-COIL clip): Using sterile technique and 1% Lidocaine with and without epinephrine as local anesthetic, under direct ultrasound visualization, a 12 gauge  spring-loaded device was used to perform biopsy of the 2.4 cm mass at the 6 o'clock position of the RIGHT breast using a LATERAL approach. At the conclusion of the procedure a COIL shaped tissue marker clip was deployed into the biopsy cavity. Follow up 2 view mammogram was performed and dictated separately. IMPRESSION: Ultrasound guided biopsy of a 1.4 cm LOWER INNER RIGHT breast mass (4 o'clock position-RIBBON clip). Ultrasound-guided biopsy of a 2.4 cm LOWER RIGHT breast mass (6 o'clock position-COIL clip). No apparent complications. Electronically Signed: By: Margarette Canada M.D. 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 This was found to be concordant by Dr. Hassan Rowan. Pathology revealed Breast, RIGHT, needle core biopsy, lower, 6 o'clock position, 3cmfn, (coil clip)- DUCTAL CARCINOMA IN SITU, GRADE 2 OF 3, FOCALLY SUSPICIOUS FOR MICROINVASION, CALCIFICATIONS: PRESENT. This was found to be concordant by Dr. Hassan Rowan. Pathology results were discussed with the patient's daughter Caleesi Kohl) by telephone. The patient reported doing well after the biopsies with tenderness at  the sites. Post biopsy instructions and care were reviewed and questions were answered. The patient was encouraged to call [facility] for any additional concerns. The patient was referred to [facility] on June 12, 2022. Consider breast MRI given lobular histology. Pathology results reported by Stacie Acres RN on 06/06/2022. Electronically Signed   By: Margarette Canada M.D.   On: 06/06/2022 15:35   Result Date: 06/06/2022 CLINICAL DATA:  66 year old female for tissue sampling of 1.4 cm LOWER INNER RIGHT breast mass (4 o'clock position) and 2.4 cm LOWER RIGHT breast mass (6 o'clock position). The 0.4 x 0.5 cm mass at the 3 o'clock position of the RIGHT breast 2 cm from the nipple on the 05/20/2022 ultrasound images is abutting the 4 o'clock mass and therefore not sampled. EXAM: ULTRASOUND GUIDED RIGHT BREAST CORE NEEDLE BIOPSY X 2 COMPARISON:  None Available. PROCEDURE: I met with the patient and we discussed the procedure of ultrasound-guided biopsy, including benefits and alternatives. We discussed the high likelihood of successful procedures. We discussed the risks of the procedures, including infection, bleeding, tissue injury, clip migration, and inadequate sampling. Informed written consent was given. The usual time-out protocol was performed immediately prior to the procedures. ULTRASOUND GUIDED RIGHT BREAST CORE NEEDLE BIOPSY #1 (1.4 cm 4 o'clock position mass-RIBBON clip): Using sterile technique and 1% Lidocaine with and without epinephrine as local anesthetic, under direct ultrasound visualization, a 14 gauge spring-loaded device was used to perform biopsy of the 1.4 cm mass at the 4 o'clock position of the RIGHT breast using a superolateral approach. At the conclusion of the procedure a RIBBON shaped tissue marker clip was deployed into the biopsy cavity. Follow up 2 view mammogram was performed and dictated  separately. ULTRASOUND GUIDED RIGHT BREAST CORE NEEDLE BIOPSY #2 (2.4 cm 6 o'clock position mass-COIL clip): Using sterile technique and 1% Lidocaine with and without epinephrine as local anesthetic, under direct ultrasound visualization, a 12 gauge spring-loaded device was used to perform biopsy of the 2.4 cm mass at the 6 o'clock position of the  RIGHT breast using a LATERAL approach. At the conclusion of the procedure a COIL shaped tissue marker clip was deployed into the biopsy cavity. Follow up 2 view mammogram was performed and dictated separately. IMPRESSION: Ultrasound guided biopsy of a 1.4 cm LOWER INNER RIGHT breast mass (4 o'clock position-RIBBON clip). Ultrasound-guided biopsy of a 2.4 cm LOWER RIGHT breast mass (6 o'clock position-COIL clip). No apparent complications. Electronically Signed: By: Margarette Canada M.D. On: 06/03/2022 09:18  MM CLIP PLACEMENT RIGHT  Result Date: 06/03/2022 CLINICAL DATA:  Evaluate placement of RIBBON and COIL biopsy clips following ultrasound-guided RIGHT breast biopsies. EXAM: 3D DIAGNOSTIC RIGHT MAMMOGRAM POST ULTRASOUND BIOPSY COMPARISON:  Previous exam(s). FINDINGS: 3D Mammographic images were obtained following ultrasound guided biopsy of a 1.4 cm 4 o'clock position mass (RIBBON clip) and a 2.4 cm 6 o'clock position mass (COIL clip). The RIBBON biopsy marking clip is in expected position at the site of biopsied 4 o'clock position mass. The COIL biopsy marking clip is in expected position at the site of biopsied 6 o'clock position mass. The 2 clips are separated by a distance of 2 cm. IMPRESSION: Appropriate positioning of the RIBBON shaped biopsy marking clip at the site of biopsy in the INNER RIGHT breast. Appropriate positioning of the COIL shaped biopsy marking clip at the site of biopsy in the LOWER RIGHT breast. Final Assessment: Post Procedure Mammograms for Marker Placement Electronically Signed   By: Margarette Canada M.D.   On: 06/03/2022 09:22  MM DIAG BREAST TOMO  UNI RIGHT  Result Date: 05/20/2022 CLINICAL DATA:  Patient returns today to evaluate a possible RIGHT breast mass identified on a recent baseline screening mammogram. EXAM: DIGITAL DIAGNOSTIC UNILATERAL RIGHT MAMMOGRAM WITH TOMOSYNTHESIS AND CAD; ULTRASOUND RIGHT BREAST LIMITED TECHNIQUE: Right digital diagnostic mammography and breast tomosynthesis was performed. The images were evaluated with computer-aided detection.; Targeted ultrasound examination of the right breast was performed COMPARISON:  Baseline screening mammogram dated 05/09/2022. ACR Breast Density Category c: The breast tissue is heterogeneously dense, which may obscure small masses. FINDINGS: On today's additional diagnostic views with spot compression and 3D tomosynthesis, an asymmetry with associated architectural distortion is confirmed within the inner RIGHT breast. There is an additional focal asymmetry confirmed within the lower RIGHT breast, 6 o'clock axis region. Targeted ultrasound is performed, showing an irregular hypoechoic shadowing mass within the RIGHT breast at the 4 o'clock axis, 2 cm from the nipple, measuring 1.4 x 0.9 x 1.2 cm, corresponding to the asymmetry with associated architectural distortion seen on mammogram. Targeted ultrasound is performed, showing an additional irregular hypoechoic area within the RIGHT breast at the 6 o'clock axis, 3 cm from the nipple, measuring 2.4 x 0.8 x 1.4 cm, corresponding to the additional asymmetry identified on today's diagnostic mammogram. RIGHT axilla was evaluated with ultrasound showing no enlarged or morphologically abnormal lymph nodes. IMPRESSION: 1. Irregular hypoechoic shadowing mass within the RIGHT breast at the 4 o'clock axis, 2 cm from the nipple, measuring 1.4 cm, corresponding to the asymmetry with associated architectural distortion seen on mammogram within the inner RIGHT breast. Ultrasound-guided biopsy is recommended. 2. Additional irregular hypoechoic area within the  RIGHT breast at the 6 o'clock axis, 3 cm from the nipple, measuring 2.4 cm, corresponding to the additional asymmetry identified on today's diagnostic mammogram. Ultrasound-guided biopsy is recommended. RECOMMENDATION: 1. Ultrasound-guided biopsy for the irregular hypoechoic shadowing mass within the RIGHT breast at the 4 o'clock axis, 2 cm from the nipple, measuring 1.4 cm. 2. Ultrasound-guided biopsy for the additional irregular  hypoechoic area within the RIGHT breast at the 6 o'clock axis, 3 cm from the nipple, measuring 2.4 cm. Ultrasound-guided biopsies are scheduled on July 3rd. I have discussed the findings and recommendations with the patient. If applicable, a reminder letter will be sent to the patient regarding the next appointment. BI-RADS CATEGORY  4: Suspicious. Electronically Signed   By: Franki Cabot M.D.   On: 05/20/2022 12:03  US BREAST LTD UNI RIGHT INC AXILLA  Result Date: 05/20/2022 CLINICAL DATA:  Patient returns today to evaluate a possible RIGHT breast mass identified on a recent baseline screening mammogram. EXAM: DIGITAL DIAGNOSTIC UNILATERAL RIGHT MAMMOGRAM WITH TOMOSYNTHESIS AND CAD; ULTRASOUND RIGHT BREAST LIMITED TECHNIQUE: Right digital diagnostic mammography and breast tomosynthesis was performed. The images were evaluated with computer-aided detection.; Targeted ultrasound examination of the right breast was performed COMPARISON:  Baseline screening mammogram dated 05/09/2022. ACR Breast Density Category c: The breast tissue is heterogeneously dense, which may obscure small masses. FINDINGS: On today's additional diagnostic views with spot compression and 3D tomosynthesis, an asymmetry with associated architectural distortion is confirmed within the inner RIGHT breast. There is an additional focal asymmetry confirmed within the lower RIGHT breast, 6 o'clock axis region. Targeted ultrasound is performed, showing an irregular hypoechoic shadowing mass within the RIGHT breast at the  4 o'clock axis, 2 cm from the nipple, measuring 1.4 x 0.9 x 1.2 cm, corresponding to the asymmetry with associated architectural distortion seen on mammogram. Targeted ultrasound is performed, showing an additional irregular hypoechoic area within the RIGHT breast at the 6 o'clock axis, 3 cm from the nipple, measuring 2.4 x 0.8 x 1.4 cm, corresponding to the additional asymmetry identified on today's diagnostic mammogram. RIGHT axilla was evaluated with ultrasound showing no enlarged or morphologically abnormal lymph nodes. IMPRESSION: 1. Irregular hypoechoic shadowing mass within the RIGHT breast at the 4 o'clock axis, 2 cm from the nipple, measuring 1.4 cm, corresponding to the asymmetry with associated architectural distortion seen on mammogram within the inner RIGHT breast. Ultrasound-guided biopsy is recommended. 2. Additional irregular hypoechoic area within the RIGHT breast at the 6 o'clock axis, 3 cm from the nipple, measuring 2.4 cm, corresponding to the additional asymmetry identified on today's diagnostic mammogram. Ultrasound-guided biopsy is recommended. RECOMMENDATION: 1. Ultrasound-guided biopsy for the irregular hypoechoic shadowing mass within the RIGHT breast at the 4 o'clock axis, 2 cm from the nipple, measuring 1.4 cm. 2. Ultrasound-guided biopsy for the additional irregular hypoechoic area within the RIGHT breast at the 6 o'clock axis, 3 cm from the nipple, measuring 2.4 cm. Ultrasound-guided biopsies are scheduled on July 3rd. I have discussed the findings and recommendations with the patient. If applicable, a reminder letter will be sent to the patient regarding the next appointment. BI-RADS CATEGORY  4: Suspicious. Electronically Signed   By: Franki Cabot M.D.   On: 05/20/2022 12:03      IMPRESSION/PLAN: 1. Stage IA, [id number] grade 2 invasive lobular carcinoma of the right breast. Dr. Lisbeth Renshaw discusses the pathology findings and reviews the nature of early stage breast disease. The  consensus from the breast conference includes breast conservation with lumpectomy with *** sentinel node biopsy. Depending on the size of the final tumor measurements rendered by pathology, the tumor may be tested for Oncotype Dx score to determine a role for systemic therapy. Provided that chemotherapy is not indicated, the patient's course would then be followed by external radiotherapy to the breast  to reduce risks of local recurrence followed by antiestrogen therapy. We discussed the risks,  benefits, short, and long term effects of radiotherapy, as well as the curative intent, and the patient is interested in proceeding. Dr. Lisbeth Renshaw discusses the delivery and logistics of radiotherapy and anticipates a course of *** weeks of radiotherapy. We will see her back a few weeks after surgery to discuss the simulation process and anticipate we starting radiotherapy about 4-6 weeks after surgery.  2. Possible genetic predisposition to malignancy. The patient will meet with genetics in clinic today. In a visit lasting *** minutes, greater than 50% of the time was spent face to face reviewing her case, as well as in preparation of, discussing, and coordinating the patient's care.  The above documentation reflects my direct findings during this shared patient visit. Please see the separate note by Dr. Lisbeth Renshaw on this date for the remainder of the patient's plan of care.    Carola Rhine, [facility]    **Disclaimer: This note was dictated with voice recognition software. Similar sounding words can inadvertently be transcribed and this note may contain transcription errors which may not have been corrected upon publication of note.**

## 2022-06-12 ENCOUNTER — Other Ambulatory Visit: Payer: Self-pay | Admitting: *Deleted

## 2022-06-12 ENCOUNTER — Ambulatory Visit: Payer: Self-pay | Admitting: Surgery

## 2022-06-12 ENCOUNTER — Encounter: Payer: Self-pay | Admitting: *Deleted

## 2022-06-12 ENCOUNTER — Inpatient Hospital Stay: Payer: Medicare (Managed Care) | Attending: Hematology and Oncology | Admitting: Hematology and Oncology

## 2022-06-12 ENCOUNTER — Ambulatory Visit
Admission: RE | Admit: 2022-06-12 | Discharge: 2022-06-12 | Disposition: A | Discharge status: Home / Self Care | Payer: Medicare (Managed Care) | Source: Ambulatory Visit | Attending: Radiation Oncology | Admitting: Radiation Oncology

## 2022-06-12 ENCOUNTER — Inpatient Hospital Stay (HOSPITAL_BASED_OUTPATIENT_CLINIC_OR_DEPARTMENT_OTHER): Payer: Medicare (Managed Care) | Admitting: Genetic Counselor

## 2022-06-12 ENCOUNTER — Encounter: Payer: Self-pay | Admitting: Radiology

## 2022-06-12 ENCOUNTER — Inpatient Hospital Stay: Payer: Medicare (Managed Care)

## 2022-06-12 ENCOUNTER — Inpatient Hospital Stay: Payer: Medicare (Managed Care) | Admitting: Licensed Clinical Social Worker

## 2022-06-12 ENCOUNTER — Encounter: Payer: Self-pay | Admitting: Hematology and Oncology

## 2022-06-12 ENCOUNTER — Other Ambulatory Visit: Payer: Self-pay

## 2022-06-12 DIAGNOSIS — Z780 Asymptomatic menopausal state: Secondary | ICD-10-CM | POA: Insufficient documentation

## 2022-06-12 DIAGNOSIS — F17210 Nicotine dependence, cigarettes, uncomplicated: Secondary | ICD-10-CM | POA: Insufficient documentation

## 2022-06-12 DIAGNOSIS — C50311 Malignant neoplasm of lower-inner quadrant of right female breast: Secondary | ICD-10-CM

## 2022-06-12 DIAGNOSIS — C50911 Malignant neoplasm of unspecified site of right female breast: Secondary | ICD-10-CM | POA: Diagnosis not present

## 2022-06-12 DIAGNOSIS — D0511 Intraductal carcinoma in situ of right breast: Secondary | ICD-10-CM

## 2022-06-12 DIAGNOSIS — Z170 Estrogen receptor positive status [ER+]: Secondary | ICD-10-CM | POA: Diagnosis not present

## 2022-06-12 DIAGNOSIS — Z8042 Family history of malignant neoplasm of prostate: Secondary | ICD-10-CM | POA: Diagnosis not present

## 2022-06-12 DIAGNOSIS — Z8673 Personal history of transient ischemic attack (TIA), and cerebral infarction without residual deficits: Secondary | ICD-10-CM | POA: Diagnosis not present

## 2022-06-12 DIAGNOSIS — C50312 Malignant neoplasm of lower-inner quadrant of left female breast: Secondary | ICD-10-CM

## 2022-06-12 DIAGNOSIS — I10 Essential (primary) hypertension: Secondary | ICD-10-CM | POA: Insufficient documentation

## 2022-06-12 LAB — CBC WITH DIFFERENTIAL (CANCER CENTER ONLY)
Abs Immature Granulocytes: 0.01 10*3/uL (ref 0.00–0.07)
Basophils Absolute: 0 10*3/uL (ref 0.0–0.1)
Basophils Relative: 0 %
Eosinophils Absolute: 0.1 10*3/uL (ref 0.0–0.5)
Eosinophils Relative: 2 %
HCT: 37.8 % (ref 36.0–46.0)
Hemoglobin: 12.1 g/dL (ref 12.0–15.0)
Immature Granulocytes: 0 %
Lymphocytes Relative: 21 %
Lymphs Abs: 1.4 10*3/uL (ref 0.7–4.0)
MCH: 30 pg (ref 26.0–34.0)
MCHC: 32 g/dL (ref 30.0–36.0)
MCV: 93.6 fL (ref 80.0–100.0)
Monocytes Absolute: 0.8 10*3/uL (ref 0.1–1.0)
Monocytes Relative: 12 %
Neutro Abs: 4.1 10*3/uL (ref 1.7–7.7)
Neutrophils Relative %: 65 %
Platelet Count: 169 10*3/uL (ref 150–400)
RBC: 4.04 MIL/uL (ref 3.87–5.11)
RDW: 14.4 % (ref 11.5–15.5)
WBC Count: 6.4 10*3/uL (ref 4.0–10.5)
nRBC: 0 % (ref 0.0–0.2)

## 2022-06-12 LAB — CMP (CANCER CENTER ONLY)
ALT: 15 U/L (ref 0–44)
AST: 17 U/L (ref 15–41)
Albumin: 4 g/dL (ref 3.5–5.0)
Alkaline Phosphatase: 83 U/L (ref 38–126)
Anion gap: 7 (ref 5–15)
BUN: 22 mg/dL (ref 8–23)
CO2: 25 mmol/L (ref 22–32)
Calcium: 9.6 mg/dL (ref 8.9–10.3)
Chloride: 107 mmol/L (ref 98–111)
Creatinine: 1.21 mg/dL — ABNORMAL HIGH (ref 0.44–1.00)
GFR, Estimated: 49 mL/min — ABNORMAL LOW (ref >60)
Glucose, Bld: 91 mg/dL (ref 70–99)
Potassium: 4.3 mmol/L (ref 3.5–5.1)
Sodium: 139 mmol/L (ref 135–145)
Total Bilirubin: 0.4 mg/dL (ref 0.3–1.2)
Total Protein: 7.9 g/dL (ref 6.5–8.1)

## 2022-06-12 LAB — GENETIC SCREENING ORDER

## 2022-06-12 NOTE — Assessment & Plan Note (Addendum)
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  Given pathology findings consistent with grade 2 ILC and strong ER positivity, we have discussed about proceeding with upfront surgery followed by review of final pathology and additional recommendations.  We have also discussed about Oncotype and the following details about Oncotype  We have discussed about Oncotype Dx score which is a well validated prognostic scoring system which can predict outcome with endocrine therapy alone and whether chemotherapy reduces recurrence.  Typically in patients with ER positive cancers that are node negative if the RS score is high typically greater than or equal to 26, chemotherapy is recommended.  In women with intermediate recurrence score younger than 49, there can still be some role for chemotherapy in addition to endocrine therapy especially if the recurrence score is between 21-25. If chemotherapy is needed, this will precede radiation and then after radiation she will continue on antiestrogen therapy.  We have also discussed the following details about antiestrogen therapy. We have discussed options for antiestrogen therapy today  With regards to Tamoxifen, we discussed that this is a SERM, selective estrogen receptor modulator. We discussed mechanism of action of Tamoxifen, adverse effects on Tamoxifen including but not limited to post menopausal symptoms, increased risk of DVT/PE, increased risk of endometrial cancer, questionable cataracts with long term use and increased risk of cardiovascular events in the study which was not statistically significant. A benefit from Tamoxifen would be improvement in bone density.  With  regards to aromatase inhibitors, we discussed mechanism of action, adverse effects including but not limited to post menopausal symptoms, arthralgias, myalgias, increased risk of cardiovascular events and bone loss.   She will return to clinic after surgery to discuss Oncotype results.  Given her history of cardiovascular events, I think aromatase inhibitors might be a better choice for her.  We will also order a baseline bone density.

## 2022-06-12 NOTE — Progress Notes (Signed)
[facility] NOTE  Patient Care Team: Ladell Pier, MD as PCP - General (Internal Medicine) Donnie Mesa, MD as Consulting Physician (General Surgery) Benay Pike, MD as Consulting Physician (Hematology and Oncology) Kyung Rudd, MD as Consulting Physician (Radiation Oncology) Mauro Kaufmann, RN as Oncology Nurse Navigator Rockwell Germany, RN as Oncology Nurse Navigator  CHIEF COMPLAINTS/PURPOSE OF CONSULTATION:  Newly diagnosed breast cancer  HISTORY OF PRESENTING ILLNESS:  Amy Howell 66 y.o. female is here because of recent diagnosis of right [place]  I reviewed her records extensively and collaborated the history with the patient.  SUMMARY OF ONCOLOGIC HISTORY: Oncology History  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, right ([place])  05/09/2022 Mammogram   Screening mammogram showed possible mass in the right breast warranting further evaluation.  Further evaluation showed asymmetry with associated architectural distortion confirmed in the inner right breast and additional focal symmetry confirmed in the lower right breast at 6:00 axis region.  Targeted ultrasound showed an irregular hypoechoic shadowing mass within the right breast at 4:00 Axis II centimeters from the nipple measuring 1.4 x 0.9 x 1.2 cm corresponding to the asymmetry with associated architectural distortion seen on mammogram.  There is also an additional irregular hypoechoic area within the right breast at 6:00, 3 cm from the nipple measuring 2.4 x 0.8 x 1.4 cm.  Right axilla normal   06/03/2022 Pathology Results   Pathology from the lower end at 4 o'clock position right breast mass 2 cm from the nipple showed invasive lobular carcinoma and lobular carcinoma in situ overall grade 2 of 3, prognostics mentioned in the [place] are pertinent for ER 95% strong, PR 10%, Ki-67 of 5% and HER2 negative.  Right breast needle core biopsy at the 6 o'clock position showed DCIS,  grade 2, possible microinvasion and prognostics pending   06/10/2022 Initial Diagnosis   Malignant neoplasm of lower-inner quadrant of left breast in female, estrogen receptor positive ([place])   06/10/2022 Cancer Staging   Staging form: Breast, AJCC 8th Edition - Clinical: Stage IA (cT1c, cN0, cM0, G2, ER+, PR+, HER2-) - Signed by Hayden Pedro, PA-C on 06/10/2022 Method of lymph node assessment: Clinical Histologic grading system: 3 grade system    She was presented in the breast [place] for further recommendations. She is here with her daughter to the appointment.  Her last mammogram prior to this was about 5 years ago.  She felt this lump in her right breast about 3 months ago.  No family history of breast cancer.  She did not take birth control for a long time, took it when she was 45.  She has hypertension, longstanding but does not take any medications, she tells me that she had reactions to several of the antihypertensives.  She had history of aortic aneurysm status post coiling in 2018 and had mini strokes.  Rest of the pertinent 10 point ROS reviewed and negative  MEDICAL HISTORY:  Past Medical History:  Diagnosis Date   Cataracts, bilateral    Cerebral infarction due to carotid artery stenosis (HCC)    Glaucoma    Headache    Hypertension    Stroke ([place])    no residual, "series of mini strokes"   Vertigo     SURGICAL HISTORY: Past Surgical History:  Procedure Laterality Date   ABDOMINAL HYSTERECTOMY     CARDIAC DEFIBRILLATOR PLACEMENT     gum operation     Implantable loop recorder removal  02/01/2019  MDT Reveal LINQ explantation in office by JA   IR 3D INDEPENDENT WKST  05/07/2017   IR ANGIO INTRA EXTRACRAN SEL COM CAROTID INNOMINATE BILAT MOD SED  04/23/2017   IR ANGIO INTRA EXTRACRAN SEL COM CAROTID INNOMINATE BILAT MOD SED  08/06/2017   IR ANGIO INTRA EXTRACRAN SEL COM CAROTID INNOMINATE UNI R MOD SED  05/07/2017   IR ANGIO VERTEBRAL SEL VERTEBRAL BILAT MOD SED   04/23/2017   IR ANGIO VERTEBRAL SEL VERTEBRAL BILAT MOD SED  08/06/2017   IR ANGIOGRAM FOLLOW UP STUDY  05/07/2017   IR ANGIOGRAM SELECTIVE EACH ADDITIONAL VESSEL  05/07/2017   IR RADIOLOGIST EVAL & MGMT  04/16/2017   IR RADIOLOGIST EVAL & MGMT  05/29/2017   IR TRANSCATH/EMBOLIZ  05/07/2017   LOOP RECORDER INSERTION N/A 03/28/2017   Procedure: Loop Recorder Insertion;  Surgeon: Thompson Grayer, MD;  Location: [facility];  Service: Cardiovascular;  Laterality: N/A;   RADIOLOGY WITH ANESTHESIA N/A 05/07/2017   Procedure: EMBOLIZATION;  Surgeon: Luanne Bras, MD;  Location: [facility];  Service: Radiology;  Laterality: N/A;   RADIOLOGY WITH ANESTHESIA N/A 08/06/2017   Procedure: RADIOLOGY WITH ANESTHESIA STENTING;  Surgeon: Luanne Bras, MD;  Location: [facility];  Service: Radiology;  Laterality: N/A;   TEE WITHOUT CARDIOVERSION N/A 03/28/2017   Procedure: TRANSESOPHAGEAL ECHOCARDIOGRAM (TEE);  Surgeon: Lelon Perla, MD;  Location[facility];  Service: Cardiovascular;  Laterality: N/A;    SOCIAL HISTORY: Social History   Socioeconomic History   Marital status: Single    Spouse name: Not on file   Number of children: Not on file   Years of education: Not on file   Highest education level: Not on file  Occupational History   Not on file  Tobacco Use   Smoking status: Every Day    Packs/day: 0.25    Types: Cigarettes   Smokeless tobacco: Never   Tobacco comments:    1 cigarette a day   Vaping Use   Vaping Use: Never used  Substance and Sexual Activity   Alcohol use: No   Drug use: No   Sexual activity: Not on file  Other Topics Concern   Not on file  Social History Narrative   Not on file   Social Determinants of Health   Financial Resource Strain: Not on file  Food Insecurity: Not on file  Transportation Needs: Not on file  Physical Activity: Not on file  Stress: Not on file  Social Connections: Not on file  Intimate Partner Violence: Not on file    FAMILY  HISTORY: Family History  Problem Relation Age of Onset   Hypertension Father    Diabetes Father    Breast cancer Neg Hx     ALLERGIES:  is allergic to lisinopril.  MEDICATIONS:  Current Outpatient Medications  Medication Sig Dispense Refill   aspirin 325 MG tablet Take 1 tablet (325 mg total) by mouth daily. 90 tablet 3   ticagrelor (BRILINTA) 90 MG TABS tablet Take 0.5 tablets (45 mg total) by mouth 2 (two) times daily. 30 tablet 5   acetaminophen (TYLENOL) 500 MG tablet Take 1,000 mg by mouth daily as needed (for pain.).  (Patient not taking: Reported on 04/12/2022)     amLODipine (NORVASC) 10 MG tablet Take 1 tablet (10 mg total) by mouth daily. 90 tablet 1   brimonidine (ALPHAGAN) 0.2 % ophthalmic solution Instill 1 drop into both eyes twice a day 15 mL 11   dorzolamide (TRUSOPT) 2 % ophthalmic solution  Instill 1 drop into both eyes twice a day 30 mL 3   feeding supplement, ENSURE ENLIVE, (ENSURE ENLIVE) LIQD Take 237 mLs by mouth 2 (two) times daily.     latanoprost (XALATAN) 0.005 % ophthalmic solution Instill 1 drop into both eyes at bedtime 22.5 mL 3   timolol (BETIMOL) 0.5 % ophthalmic solution 1 drop 2 (two) times daily. (Patient not taking: Reported on 06/12/2022)     triamcinolone cream (KENALOG) 0.1 % Apply 1 application topically 2 (two) times daily. (Patient not taking: Reported on 04/12/2022) 30 g 0   No current facility-administered medications for this visit.    REVIEW OF SYSTEMS:   Constitutional: Denies fevers, chills or abnormal night sweats Eyes: Denies blurriness of vision, double vision or watery eyes Ears, nose, mouth, throat, and face: Denies mucositis or sore throat Respiratory: Denies cough, dyspnea or wheezes Cardiovascular: Denies palpitation, chest discomfort or lower extremity swelling Gastrointestinal:  Denies nausea, heartburn or change in bowel habits Skin: Denies abnormal skin rashes Lymphatics: Denies new lymphadenopathy or easy  bruising Neurological:Denies numbness, tingling or new weaknesses Behavioral/Psych: Mood is stable, no new changes  Breast: Palpated a lump in the right breast about 3 months ago All other systems were reviewed with the patient and are negative.  PHYSICAL EXAMINATION: ECOG PERFORMANCE STATUS: 0 - Asymptomatic  Vitals:   06/12/22 0837  BP: (!) 186/87  Pulse: 80  Resp: 18  Temp: 97.7 F (36.5 C)  SpO2: 100%   Filed Weights   06/12/22 0837  Weight: 104 lb 3.2 oz (47.3 kg)    GENERAL:alert, no distress and comfortable SKIN: skin color, texture, turgor are normal, no rashes or significant lesions EYES: normal, conjunctiva are pink and non-injected, sclera clear OROPHARYNX:no exudate, no erythema and lips, buccal mucosa, and tongue normal  NECK: supple, thyroid normal size, non-tender, without nodularity LYMPH:  no palpable lymphadenopathy in the cervical, axillary or inguinal LUNGS: clear to auscultation and percussion with normal breathing effort HEART: regular rate & rhythm and no murmurs and no lower extremity edema ABDOMEN:abdomen soft, non-tender and normal bowel sounds Musculoskeletal:no cyanosis of digits and no clubbing  PSYCH: alert & oriented x 3 with fluent speech NEURO: no focal motor/sensory deficits BREAST: Palpable mass in the right breast at 6:00 measuring almost 2 to 3 cm, second breast mass palpated at the 4 o'clock position measures about 2 cm.  No palpable regional adenopathy  LABORATORY DATA:  I have reviewed the data as listed Lab Results  Component Value Date   WBC 6.4 06/12/2022   HGB 12.1 06/12/2022   HCT 37.8 06/12/2022   MCV 93.6 06/12/2022   PLT 169 06/12/2022   Lab Results  Component Value Date   NA 139 06/12/2022   K 4.3 06/12/2022   CL 107 06/12/2022   CO2 25 06/12/2022    RADIOGRAPHIC STUDIES: I have personally reviewed the radiological reports and agreed with the findings in the report.  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  Given pathology findings consistent with grade 2 ILC and strong ER positivity, we have discussed about proceeding with upfront surgery followed by review of final  pathology and additional recommendations.  We have also discussed about Oncotype and the following details about Oncotype  We have discussed about Oncotype Dx score which is a well validated prognostic scoring system which can predict outcome with endocrine therapy alone and whether chemotherapy reduces recurrence.  Typically in patients with ER positive cancers that are node negative if the RS score is high typically greater than or equal to 26, chemotherapy is recommended.  In women with intermediate recurrence score younger than 50, there can still be some role for chemotherapy in addition to endocrine therapy especially if the recurrence score is between 21-25. If chemotherapy is needed, this will precede radiation and then after radiation she will continue on antiestrogen therapy.  We have also discussed the following details about antiestrogen therapy. We have discussed options for antiestrogen therapy today  With regards to Tamoxifen, we discussed that this is a SERM, selective estrogen receptor modulator. We discussed mechanism of action of Tamoxifen, adverse effects on Tamoxifen including but not limited to post menopausal symptoms, increased risk of DVT/PE, increased risk of endometrial cancer, questionable cataracts with long term use and increased risk of cardiovascular events in the study which was not statistically significant. A  benefit from Tamoxifen would be improvement in bone density.  With regards to aromatase inhibitors, we discussed mechanism of action, adverse effects including but not limited to post menopausal symptoms, arthralgias, myalgias, increased risk of cardiovascular events and bone loss.   She will return to clinic after surgery to discuss Oncotype results.  Given her history of cardiovascular events, I think aromatase inhibitors might be a better choice for her.  We will also order a baseline bone density.  I spent 60 minutes in the care of this patient including history, physical exam, review of records, counseling and coordination All questions were answered. The patient knows to call the clinic with any problems, questions or concerns.    Benay Pike, MD 06/12/22

## 2022-06-12 NOTE — Research (Signed)
[id number] - Tissue and Bodily Fluids: Translational Medicine: Discovery and Evaluation of Biomarkers/Pharmacogenomics for the Diagnosis and Personalized Management of Patients    06/12/2022  INTRO:  Patient Amy Howell was identified by Dr. Chryl Heck as a potential candidate for the above listed study.  This Clinical Research Coordinator met with Amy Howell, [id number], on 06/12/22 in a manner and location that ensures patient privacy to discuss participation in the above listed research study.  Patient is Accompanied by family .  A copy of the informed consent document and separate HIPAA Authorization was provided to the patient.  Patient reads, speaks, and understands [place].   Patient was provided with the business card of this Coordinator and encouraged to contact the research team with any questions.  Approximately 10 minutes were spent with the patient reviewing the informed consent documents.  Patient was provided the option of taking informed consent documents home to review and was encouraged to review at their convenience with their support network, including other care providers. Patient took the consent documents home to review. Will follow-up with patient early next week. Patient thanked for her time and consideration of the above mentioned study.   Carol Ada, RT(R)(T) Clinical Research Coordinator

## 2022-06-12 NOTE — H&P (View-Only) (Signed)
Subjective   Chief Complaint: Breast Cancer   Breast [place] 06/12/22 - [facility]  History of Present Illness: Amy Howell is a 66 y.o. female who is seen today as an office consultation at the request of Dr. Imaging for evaluation of Breast Cancer .    This is a 66 year old female status post coiling of a cerebral aneurysm currently anticoagulated on Brilinta and aspirin who presents after a recent screening mammogram.  This revealed 2 areas of concern in the right breast.  She had 1 mass at 4:00 that measured 1.4 cm x 0.9 x 1.2 cm.  This is located 2 cm from the nipple.  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  She had a second mass located at 6:00, 3 cm from the nipple measuring 2.4 x 0.8 x 1.4 cm.  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  No family history of breast cancer.  She is accompanied by her daughter.  The patient states that she has felt some firmness in the lower right breast for several months.  She has no symptoms on the left.  Review of Systems: A complete review of systems was obtained from the patient.  I have reviewed this information and discussed as appropriate with the patient.  See HPI as well for other ROS.  Review of Systems  Constitutional:  Positive for malaise/fatigue.  HENT: Negative.    Eyes:  Positive for blurred vision.  Respiratory: Negative.    Cardiovascular: Negative.   Gastrointestinal:  Positive for constipation.  Genitourinary: Negative.   Musculoskeletal: Negative.   Skin: Negative.   Neurological: Negative.   Endo/Heme/Allergies: Negative.   Psychiatric/Behavioral: Negative.       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)    Past Surgical History:  Procedure Laterality Date   HYSTERECTOMY N/A    Date unknown     Allergies  Allergen Reactions   Lisinopril Other (See Comments)    Angioedema    Current Outpatient Medications on File Prior to Visit  Medication Sig Dispense Refill   amLODIPine (NORVASC) 10 MG tablet Take 1 tablet by mouth once daily     brimonidine (ALPHAGAN) 0.2 % ophthalmic solution Apply 1 drop to eye 2 (two) times daily     dorzolamide (TRUSOPT) 2 % ophthalmic solution Apply 1 drop to eye 2 (two) times daily     latanoprost (XALATAN) 0.005 % ophthalmic solution Apply 1 drop to eye at bedtime     ticagrelor (BRILINTA) 90 mg tablet Take by mouth     acetaminophen (TYLENOL) 500 MG tablet Take by mouth     No current facility-administered medications on file prior to visit.    Family History  Problem Relation Age of Onset   Diabetes Father    High blood pressure (Hypertension) Father      Social History   Tobacco Use  Smoking Status Every Day   Packs/day: 0.25   Types: Cigarettes  Smokeless Tobacco Never  Tobacco Comments   "1 cigarette a day" per [facility] epic note     Social  History   Socioeconomic History   Marital status: Single  Tobacco Use   Smoking status: Every Day    Packs/day: 0.25    Types: Cigarettes   Smokeless tobacco: Never   Tobacco comments:    "1 cigarette a day" per [facility] epic note  Vaping Use   Vaping Use: Unknown  Substance and Sexual Activity   Alcohol use: Defer   Drug use: Defer    Objective:    Physical Exam   Constitutional:  WDWN in NAD, conversant, no obvious deformities; lying in bed comfortably Eyes:  Pupils equal, round; sclera anicteric; moist conjunctiva; no lid lag HENT:  Oral mucosa moist; good dentition  Neck:  No masses palpated, trachea midline; no thyromegaly Lungs:  CTA bilaterally; normal  respiratory effort Breasts:  symmetric, no nipple changes; no lymphadenopathy on either side; the left breast shows no palpable masses. The right breast shows a palpable firmness in the lower half of the breast with a 3 cm area of distinct firmness located directly below the nipple.  There is some slight tethering of the overlying skin.  There is some indistinct firmness palpated medially as well. CV:  Regular rate and rhythm; no murmurs; extremities well-perfused with no edema Abd:  +bowel sounds, soft, non-tender, no palpable organomegaly; no palpable hernias Musc:  Unable to assess gait; no apparent clubbing or cyanosis in extremities Lymphatic:  No palpable cervical or axillary lymphadenopathy Skin:  Warm, dry; no sign of jaundice Psychiatric - alert and oriented x 4; calm mood and affect   Labs, Imaging and Diagnostic Testing: Diagnosis 1. Breast, right, needle core biopsy, lower inner, 4 o'clock position, 2cmfn, ribbon clip - INVASIVE LOBULAR CARCINOMA, (AN E-CADHERIN STAIN SHOWS LOSS OF MEMBRANOUS STAINING SUPPORTING THE DIAGNOSIS), SEE NOTE - LOBULAR CARCINOMA IN SITU CARCINOMA PRESENT (AN E-CADHERIN STAIN SHOWS LOSS OF MEMBRANOUS STAINING SUPPORTING THE DIAGNOSIS), 2 OF 3 GRADE - TUBULE FORMATION: SCORE 3 OF 3 - NUCLEAR PLEOMORPHISM: SCORE 2 OF 3 - MITOTIC COUNT: SCORE 1 OF 3 - TOTAL SCORE: 6 OF 3 - OVERALL GRADE: 2 OF 3 - LYMPHOVASCULAR INVASION: NOT IDENTIFIED - CANCER LENGTH: 11 MM IN GREATEST LINEAR DIMENSION ON A FRAGMENTED CORE. - CALCIFICATIONS: NOT IDENTIFIED - OTHER FINDINGS: N/A NOTE:DR. KASHIKAR REVIEWED THE CASE AND CONCURS WITH THE INTERPRETATION. A BREAST PROGNOSTIC PROFILE (ER, PR, KI-67 AND HER2) IS PENDING AND WILL BE REPORTED IN AN ADDENDUM. SUSAN AT [facility] [place] WAS NOTIFIED ON 06/05/2022 WITH FOLLOW-UP ON 06/06/2022. 2. Breast, right, needle core biopsy, lower, 6 o'clock position, 3cmfn, coil clip - DUCTAL CARCINOMA IN SITU, GRADE 2 OF 3 -  FOCALLY SUSPICIOUS FOR MICROINVASION - NECROSIS: NOT IDENTIFIED - CALCIFICATIONS: PRESENT - DCIS LENGTH: 9 MM IN GREATEST LINEAR DIMENSION ON FRAGMENTED CORES NOTE: DR. Vic Ripper REVIEWED THE CASE AND CONCURS WITH THE INTERPRETATION. A BREAST PROGNOSTIC PROFILE (ER AND PR) IS PENDING AND WILL BE REPORTED IN AN ADDENDUM. SUSAN AT [place] NOTIFIED ON 06/06/2022. IMMUNOHISTOCHEMICAL STAINS FOR THE MYOEPITHELIAL CELLS (CALPONIN, SMOOTH MUSCLE MYOSIN AND P63) ARE OVERALL PREDOMINANTLY RETAINED CONSISTENT WITH A DIAGNOSIS OF DCIS; HOWEVER, MORPHOLOGICALLY, FOCALLY, THERE IS STILL SUSPICION FOR POSSIBLE MICROINVASION. CK5/6 AND ER (STRONGLY 100% POSITIVE) SUPPORTS THE DIAGNOSIS Tilford Pillar DO Pathologist, Electronic Signature (Case signed 06/06/2022)     CLINICAL DATA:  Patient returns today to evaluate a possible RIGHT breast mass identified on a recent baseline screening mammogram.   EXAM: DIGITAL DIAGNOSTIC UNILATERAL RIGHT MAMMOGRAM WITH TOMOSYNTHESIS AND CAD; ULTRASOUND RIGHT BREAST LIMITED  TECHNIQUE: Right digital diagnostic mammography and breast tomosynthesis was performed. The images were evaluated with computer-aided detection.; Targeted ultrasound examination of the right breast was performed   COMPARISON:  Baseline screening mammogram dated 05/09/2022.   ACR Breast Density Category c: The breast tissue is heterogeneously dense, which may obscure small masses.   FINDINGS: On today's additional diagnostic views with spot compression and 3D tomosynthesis, an asymmetry with associated architectural distortion is confirmed within the inner RIGHT breast.   There is an additional focal asymmetry confirmed within the lower RIGHT breast, 6 o'clock axis region.   Targeted ultrasound is performed, showing an irregular hypoechoic shadowing mass within the RIGHT breast at the 4 o'clock axis, 2 cm from the nipple, measuring 1.4 x 0.9 x 1.2 cm,  corresponding to the asymmetry with associated architectural distortion seen on mammogram.   Targeted ultrasound is performed, showing an additional irregular hypoechoic area within the RIGHT breast at the 6 o'clock axis, 3 cm from the nipple, measuring 2.4 x 0.8 x 1.4 cm, corresponding to the additional asymmetry identified on today's diagnostic mammogram.   RIGHT axilla was evaluated with ultrasound showing no enlarged or morphologically abnormal lymph nodes.   IMPRESSION: 1. Irregular hypoechoic shadowing mass within the RIGHT breast at the 4 o'clock axis, 2 cm from the nipple, measuring 1.4 cm, corresponding to the asymmetry with associated architectural distortion seen on mammogram within the inner RIGHT breast. Ultrasound-guided biopsy is recommended. 2. Additional irregular hypoechoic area within the RIGHT breast at the 6 o'clock axis, 3 cm from the nipple, measuring 2.4 cm, corresponding to the additional asymmetry identified on today's diagnostic mammogram. Ultrasound-guided biopsy is recommended.   RECOMMENDATION: 1. Ultrasound-guided biopsy for the irregular hypoechoic shadowing mass within the RIGHT breast at the 4 o'clock axis, 2 cm from the nipple, measuring 1.4 cm. 2. Ultrasound-guided biopsy for the additional irregular hypoechoic area within the RIGHT breast at the 6 o'clock axis, 3 cm from the nipple, measuring 2.4 cm.   Ultrasound-guided biopsies are scheduled on July 3rd.   I have discussed the findings and recommendations with the patient. If applicable, a reminder letter will be sent to the patient regarding the next appointment.   BI-RADS CATEGORY  4: Suspicious.     Electronically Signed   By: Franki Cabot M.D.   On: 05/20/2022 12:03    Assessment and Plan:  Diagnoses and all orders for this visit:  Invasive lobular carcinoma of breast, stage 1, right (CMS-HCC)  Ductal carcinoma in situ (DCIS) of right breast     I spent some time with  the patient and her daughter discussing her surgical options.  The patient states that she has had small breasts her entire adult life and wants to proceed with mastectomy.  She does not want to consider breast conserving therapy.  We discussed the procedure as well as sentinel lymph node biopsy.  The patient is in agreement with this plan.  Mastectomy with sentinel lymph node biopsy.The surgical procedure has been discussed with the patient.  Potential risks, benefits, alternative treatments, and expected outcomes have been explained.  All of the patient's questions at this time have been answered.  The likelihood of reaching the patient's treatment goal is good.  The patient understand the proposed surgical procedure and wishes to proceed.   Oncology will obtain Oncotype testing as well as planning antiestrogens.  Radiation therapy needed due to the mastectomy.  No follow-ups on file.  Carlean Jews, MD  06/12/2022 11:39 AM

## 2022-06-12 NOTE — H&P (Signed)
Subjective   Chief Complaint: Breast Cancer   Breast [place] 06/12/22 - [facility]  History of Present Illness: Amy Howell is a 66 y.o. female who is seen today as an office consultation at the request of Dr. Imaging for evaluation of Breast Cancer .    This is a 66 year old female status post coiling of a cerebral aneurysm currently anticoagulated on Brilinta and aspirin who presents after a recent screening mammogram.  This revealed 2 areas of concern in the right breast.  She had 1 mass at 4:00 that measured 1.4 cm x 0.9 x 1.2 cm.  This is located 2 cm from the nipple.  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  She had a second mass located at 6:00, 3 cm from the nipple measuring 2.4 x 0.8 x 1.4 cm.  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  No family history of breast cancer.  She is accompanied by her daughter.  The patient states that she has felt some firmness in the lower right breast for several months.  She has no symptoms on the left.  Review of Systems: A complete review of systems was obtained from the patient.  I have reviewed this information and discussed as appropriate with the patient.  See HPI as well for other ROS.  Review of Systems  Constitutional:  Positive for malaise/fatigue.  HENT: Negative.    Eyes:  Positive for blurred vision.  Respiratory: Negative.    Cardiovascular: Negative.   Gastrointestinal:  Positive for constipation.  Genitourinary: Negative.   Musculoskeletal: Negative.   Skin: Negative.   Neurological: Negative.   Endo/Heme/Allergies: Negative.   Psychiatric/Behavioral: Negative.       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)    Past Surgical History:  Procedure Laterality Date   HYSTERECTOMY N/A    Date unknown     Allergies  Allergen Reactions   Lisinopril Other (See Comments)    Angioedema    Current Outpatient Medications on File Prior to Visit  Medication Sig Dispense Refill   amLODIPine (NORVASC) 10 MG tablet Take 1 tablet by mouth once daily     brimonidine (ALPHAGAN) 0.2 % ophthalmic solution Apply 1 drop to eye 2 (two) times daily     dorzolamide (TRUSOPT) 2 % ophthalmic solution Apply 1 drop to eye 2 (two) times daily     latanoprost (XALATAN) 0.005 % ophthalmic solution Apply 1 drop to eye at bedtime     ticagrelor (BRILINTA) 90 mg tablet Take by mouth     acetaminophen (TYLENOL) 500 MG tablet Take by mouth     No current facility-administered medications on file prior to visit.    Family History  Problem Relation Age of Onset   Diabetes Father    High blood pressure (Hypertension) Father      Social History   Tobacco Use  Smoking Status Every Day   Packs/day: 0.25   Types: Cigarettes  Smokeless Tobacco Never  Tobacco Comments   "1 cigarette a day" per [facility] epic note     Social  History   Socioeconomic History   Marital status: Single  Tobacco Use   Smoking status: Every Day    Packs/day: 0.25    Types: Cigarettes   Smokeless tobacco: Never   Tobacco comments:    "1 cigarette a day" per [facility] epic note  Vaping Use   Vaping Use: Unknown  Substance and Sexual Activity   Alcohol use: Defer   Drug use: Defer    Objective:    Physical Exam   Constitutional:  WDWN in NAD, conversant, no obvious deformities; lying in bed comfortably Eyes:  Pupils equal, round; sclera anicteric; moist conjunctiva; no lid lag HENT:  Oral mucosa moist; good dentition  Neck:  No masses palpated, trachea midline; no thyromegaly Lungs:  CTA bilaterally; normal  respiratory effort Breasts:  symmetric, no nipple changes; no lymphadenopathy on either side; the left breast shows no palpable masses. The right breast shows a palpable firmness in the lower half of the breast with a 3 cm area of distinct firmness located directly below the nipple.  There is some slight tethering of the overlying skin.  There is some indistinct firmness palpated medially as well. CV:  Regular rate and rhythm; no murmurs; extremities well-perfused with no edema Abd:  +bowel sounds, soft, non-tender, no palpable organomegaly; no palpable hernias Musc:  Unable to assess gait; no apparent clubbing or cyanosis in extremities Lymphatic:  No palpable cervical or axillary lymphadenopathy Skin:  Warm, dry; no sign of jaundice Psychiatric - alert and oriented x 4; calm mood and affect   Labs, Imaging and Diagnostic Testing: Diagnosis 1. Breast, right, needle core biopsy, lower inner, 4 o'clock position, 2cmfn, ribbon clip - INVASIVE LOBULAR CARCINOMA, (AN E-CADHERIN STAIN SHOWS LOSS OF MEMBRANOUS STAINING SUPPORTING THE DIAGNOSIS), SEE NOTE - LOBULAR CARCINOMA IN SITU CARCINOMA PRESENT (AN E-CADHERIN STAIN SHOWS LOSS OF MEMBRANOUS STAINING SUPPORTING THE DIAGNOSIS), 2 OF 3 GRADE - TUBULE FORMATION: SCORE 3 OF 3 - NUCLEAR PLEOMORPHISM: SCORE 2 OF 3 - MITOTIC COUNT: SCORE 1 OF 3 - TOTAL SCORE: 6 OF 3 - OVERALL GRADE: 2 OF 3 - LYMPHOVASCULAR INVASION: NOT IDENTIFIED - CANCER LENGTH: 11 MM IN GREATEST LINEAR DIMENSION ON A FRAGMENTED CORE. - CALCIFICATIONS: NOT IDENTIFIED - OTHER FINDINGS: N/A NOTE:DR. KASHIKAR REVIEWED THE CASE AND CONCURS WITH THE INTERPRETATION. A BREAST PROGNOSTIC PROFILE (ER, PR, KI-67 AND HER2) IS PENDING AND WILL BE REPORTED IN AN ADDENDUM. SUSAN AT [facility] [place] WAS NOTIFIED ON 06/05/2022 WITH FOLLOW-UP ON 06/06/2022. 2. Breast, right, needle core biopsy, lower, 6 o'clock position, 3cmfn, coil clip - DUCTAL CARCINOMA IN SITU, GRADE 2 OF 3 -  FOCALLY SUSPICIOUS FOR MICROINVASION - NECROSIS: NOT IDENTIFIED - CALCIFICATIONS: PRESENT - DCIS LENGTH: 9 MM IN GREATEST LINEAR DIMENSION ON FRAGMENTED CORES NOTE: DR. Vic Ripper REVIEWED THE CASE AND CONCURS WITH THE INTERPRETATION. A BREAST PROGNOSTIC PROFILE (ER AND PR) IS PENDING AND WILL BE REPORTED IN AN ADDENDUM. SUSAN AT [place] NOTIFIED ON 06/06/2022. IMMUNOHISTOCHEMICAL STAINS FOR THE MYOEPITHELIAL CELLS (CALPONIN, SMOOTH MUSCLE MYOSIN AND P63) ARE OVERALL PREDOMINANTLY RETAINED CONSISTENT WITH A DIAGNOSIS OF DCIS; HOWEVER, MORPHOLOGICALLY, FOCALLY, THERE IS STILL SUSPICION FOR POSSIBLE MICROINVASION. CK5/6 AND ER (STRONGLY 100% POSITIVE) SUPPORTS THE DIAGNOSIS Tilford Pillar DO Pathologist, Electronic Signature (Case signed 06/06/2022)     CLINICAL DATA:  Patient returns today to evaluate a possible RIGHT breast mass identified on a recent baseline screening mammogram.   EXAM: DIGITAL DIAGNOSTIC UNILATERAL RIGHT MAMMOGRAM WITH TOMOSYNTHESIS AND CAD; ULTRASOUND RIGHT BREAST LIMITED  TECHNIQUE: Right digital diagnostic mammography and breast tomosynthesis was performed. The images were evaluated with computer-aided detection.; Targeted ultrasound examination of the right breast was performed   COMPARISON:  Baseline screening mammogram dated 05/09/2022.   ACR Breast Density Category c: The breast tissue is heterogeneously dense, which may obscure small masses.   FINDINGS: On today's additional diagnostic views with spot compression and 3D tomosynthesis, an asymmetry with associated architectural distortion is confirmed within the inner RIGHT breast.   There is an additional focal asymmetry confirmed within the lower RIGHT breast, 6 o'clock axis region.   Targeted ultrasound is performed, showing an irregular hypoechoic shadowing mass within the RIGHT breast at the 4 o'clock axis, 2 cm from the nipple, measuring 1.4 x 0.9 x 1.2 cm,  corresponding to the asymmetry with associated architectural distortion seen on mammogram.   Targeted ultrasound is performed, showing an additional irregular hypoechoic area within the RIGHT breast at the 6 o'clock axis, 3 cm from the nipple, measuring 2.4 x 0.8 x 1.4 cm, corresponding to the additional asymmetry identified on today's diagnostic mammogram.   RIGHT axilla was evaluated with ultrasound showing no enlarged or morphologically abnormal lymph nodes.   IMPRESSION: 1. Irregular hypoechoic shadowing mass within the RIGHT breast at the 4 o'clock axis, 2 cm from the nipple, measuring 1.4 cm, corresponding to the asymmetry with associated architectural distortion seen on mammogram within the inner RIGHT breast. Ultrasound-guided biopsy is recommended. 2. Additional irregular hypoechoic area within the RIGHT breast at the 6 o'clock axis, 3 cm from the nipple, measuring 2.4 cm, corresponding to the additional asymmetry identified on today's diagnostic mammogram. Ultrasound-guided biopsy is recommended.   RECOMMENDATION: 1. Ultrasound-guided biopsy for the irregular hypoechoic shadowing mass within the RIGHT breast at the 4 o'clock axis, 2 cm from the nipple, measuring 1.4 cm. 2. Ultrasound-guided biopsy for the additional irregular hypoechoic area within the RIGHT breast at the 6 o'clock axis, 3 cm from the nipple, measuring 2.4 cm.   Ultrasound-guided biopsies are scheduled on July 3rd.   I have discussed the findings and recommendations with the patient. If applicable, a reminder letter will be sent to the patient regarding the next appointment.   BI-RADS CATEGORY  4: Suspicious.     Electronically Signed   By: Franki Cabot M.D.   On: 05/20/2022 12:03    Assessment and Plan:  Diagnoses and all orders for this visit:  Invasive lobular carcinoma of breast, stage 1, right (CMS-HCC)  Ductal carcinoma in situ (DCIS) of right breast     I spent some time with  the patient and her daughter discussing her surgical options.  The patient states that she has had small breasts her entire adult life and wants to proceed with mastectomy.  She does not want to consider breast conserving therapy.  We discussed the procedure as well as sentinel lymph node biopsy.  The patient is in agreement with this plan.  Mastectomy with sentinel lymph node biopsy.The surgical procedure has been discussed with the patient.  Potential risks, benefits, alternative treatments, and expected outcomes have been explained.  All of the patient's questions at this time have been answered.  The likelihood of reaching the patient's treatment goal is good.  The patient understand the proposed surgical procedure and wishes to proceed.   Oncology will obtain Oncotype testing as well as planning antiestrogens.  Radiation therapy needed due to the mastectomy.  No follow-ups on file.  Carlean Jews, MD  06/12/2022 11:39 AM

## 2022-06-12 NOTE — Progress Notes (Signed)
Amy Howell  Initial Assessment   Amy Howell is a 66 y.o. year old female accompanied by daughter, Amy Howell. Clinical Social Howell was referred by  [facility]  for assessment of psychosocial needs.   SDOH (Social Determinants of Health) assessments performed: Yes SDOH Interventions    Flowsheet Row Most Recent Value  SDOH Interventions   Food Insecurity Interventions Intervention Not Indicated  Financial Strain Interventions Intervention Not Indicated  Housing Interventions Intervention Not Indicated  Transportation Interventions Intervention Not Indicated       SDOH Screenings   Alcohol Screen: Not on file  Depression (PHQ2-9): Low Risk  (04/12/2022)   Depression (PHQ2-9)    PHQ-2 Score: 0  Financial Resource Strain: Low Risk  (06/12/2022)   Overall Financial Resource Strain (CARDIA)    Difficulty of Paying Living Expenses: Not very hard  Food Insecurity: No Food Insecurity (06/12/2022)   Hunger Vital Sign    Worried About Running Out of Food in the Last Year: Never true    Ran Out of Food in the Last Year: Never true  Housing: Low Risk  (06/12/2022)   Housing    Last Housing Risk Score: 0  Physical Activity: Not on file  Social Connections: Not on file  Stress: Not on file  Tobacco Use: High Risk (06/12/2022)   Patient History    Smoking Tobacco Use: Every Day    Smokeless Tobacco Use: Never    Passive Exposure: Not on file  Transportation Needs: No Transportation Needs (06/12/2022)   PRAPARE - Transportation    Lack of Transportation (Medical): No    Lack of Transportation (Non-Medical): No     Distress Screen completed: Yes    06/12/2022   12:38 PM  ONCBCN DISTRESS SCREENING  Screening Type Initial Screening  Distress experienced in past week (1-10) 0      Family/Social Information:  Housing Arrangement: patient lives alone. Daughter lives nearby Family members/support persons in your life? Family- daughter, cousin & his wife,  grandkids Transportation concerns: no  Employment: Retired .  Income source: Paediatric nurse concerns: No Type of concern: None Food access concerns: no Religious or spiritual practice: Not known Services Currently in place:  n/a  Coping/ Adjustment to diagnosis: Patient understands treatment plan and what happens next? Yes Concerns about diagnosis and/or treatment: I'm not especially worried about anything Patient reported stressors:  None- does well at letting go of what is out of her control Patient enjoys  bingo, cards Current coping skills/ strengths: Active sense of humor , Capable of independent living , Motivation for treatment/growth , and Supportive family/friends     SUMMARY: Current SDOH Barriers:  No significant SDOH barriers at this time  Clinical Social Howell Clinical Goal(s):  No clinical social Howell goals at this time  Interventions: Discussed common feeling and emotions when being diagnosed with cancer, and the importance of support during treatment Informed patient of the support team roles and support services at [facility] Provided [place] contact information and encouraged patient to call with any questions or concerns Provided list of local primary care offices that accept pt's insurance per pt's request   Follow Up Plan: Patient will contact CSW with any support or resource needs Patient verbalizes understanding of plan: Yes    Amy Howell Amy Jvon Meroney, LCSW

## 2022-06-13 ENCOUNTER — Encounter: Payer: Self-pay | Admitting: Genetic Counselor

## 2022-06-13 NOTE — Progress Notes (Signed)
REFERRING PROVIDER: Benay Pike, MD [place],  [place] [address]  PRIMARY PROVIDER:  Ladell Pier, MD  PRIMARY REASON FOR VISIT:  1. 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 Ductal carcinoma in situ (DCIS) of right breast   3. Family history of prostate cancer     HISTORY OF PRESENT ILLNESS:   Amy Howell, a 66 y.o. female, was seen for a [place] cancer genetics consultation during the breast multidisciplinary clinic at the request of Dr. Chryl Heck due to a personal history of breast cancer.  Amy Howell presents to clinic today to discuss the possibility of a hereditary predisposition to cancer, to discuss genetic testing, and to further clarify her future cancer risks, as well as potential cancer risks for family members.   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 The treatment plan is pending.   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  Further evaluation showed asymmetry with associated architectural distortion confirmed in the inner right breast and additional focal symmetry confirmed in the lower right breast at 6:00 axis region.  Targeted ultrasound showed an irregular hypoechoic shadowing mass within the right breast at 4:00 Axis II centimeters from the nipple measuring 1.4 x 0.9 x 1.2 cm corresponding to the asymmetry with associated architectural distortion seen on mammogram.  There is also an additional irregular hypoechoic area within the right breast at 6:00, 3 cm from the nipple measuring 2.4 x 0.8 x 1.4 cm.  Right axilla normal   06/03/2022 Pathology Results   Pathology from the lower end at 4 o'clock  position right breast mass 2 cm from the nipple showed invasive lobular carcinoma and lobular carcinoma in situ overall grade 2 of 3, prognostics mentioned in the [place] are pertinent for ER 95% strong, PR 10%, Ki-67 of 5% and HER2 negative.  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)    Glaucoma    Headache    Hypertension    Stroke ([place])    no residual, "series of mini strokes"   Vertigo     Past Surgical History:  Procedure Laterality Date   ABDOMINAL HYSTERECTOMY     CARDIAC DEFIBRILLATOR PLACEMENT     gum operation     Implantable loop recorder removal  02/01/2019   MDT Reveal LINQ explantation in office by JA   IR 3D INDEPENDENT WKST  05/07/2017   IR ANGIO INTRA EXTRACRAN SEL COM CAROTID INNOMINATE BILAT MOD SED  04/23/2017   IR ANGIO INTRA EXTRACRAN SEL COM CAROTID INNOMINATE BILAT MOD SED  08/06/2017   IR  ANGIO INTRA EXTRACRAN SEL COM CAROTID INNOMINATE UNI R MOD SED  05/07/2017   IR ANGIO VERTEBRAL SEL VERTEBRAL BILAT MOD SED  04/23/2017   IR ANGIO VERTEBRAL SEL VERTEBRAL BILAT MOD SED  08/06/2017   IR ANGIOGRAM FOLLOW UP STUDY  05/07/2017   IR ANGIOGRAM SELECTIVE EACH ADDITIONAL VESSEL  05/07/2017   IR RADIOLOGIST EVAL & MGMT  04/16/2017   IR RADIOLOGIST EVAL & MGMT  05/29/2017   IR TRANSCATH/EMBOLIZ  05/07/2017   LOOP RECORDER INSERTION N/A 03/28/2017   Procedure: Loop Recorder  Insertion;  Surgeon: Thompson Grayer, MD;  Location: [place] [facility];  Service: Cardiovascular;  Laterality: N/A;   RADIOLOGY WITH ANESTHESIA N/A 05/07/2017   Procedure: EMBOLIZATION;  Surgeon: Luanne Bras, MD;  Location: [facility];  Service: Radiology;  Laterality: N/A;   RADIOLOGY WITH ANESTHESIA N/A 08/06/2017   Procedure: RADIOLOGY WITH ANESTHESIA STENTING;  Surgeon: Luanne Bras, MD;  Location: [place];  Service: Radiology;  Laterality: N/A;   TEE WITHOUT CARDIOVERSION N/A 03/28/2017   Procedure: TRANSESOPHAGEAL ECHOCARDIOGRAM (TEE);  Surgeon: Lelon Perla, MD;  Location: [facility] ENDOSCOPY;  Service: Cardiovascular;  Laterality: N/A;     FAMILY HISTORY:  We obtained a detailed, 4-generation family history.  Significant diagnoses are listed below: Family History  Problem Relation Age of Onset   Lung cancer Maternal Aunt        dx after 13   Prostate cancer Other        MGM's brother     Amy Howell is unaware of previous family history of genetic testing for hereditary cancer risks. There is no reported Ashkenazi Jewish ancestry. There is no known consanguinity.  GENETIC COUNSELING ASSESSMENT: Amy Howell is a 66 y.o. female with a personal history of cancer which is somewhat suggestive of a hereditary cancer syndrome and predisposition to cancer given her diagnosis of two primary breast cancers. We, therefore, discussed and recommended the following at today's visit.   DISCUSSION: We discussed that 5 - 10% of cancer is hereditary, with most cases of hereditary breast cancer associated with mutations in BRCA1/2.  There are other genes that can be associated with hereditary breast and prosate cancer syndromes.  Type of cancer risk and level of risk are gene-specific. We discussed that testing is beneficial for several reasons including knowing how to follow individuals after completing their treatment, identifying whether potential treatment options would be beneficial, and understanding  if other family members could be at risk for cancer and allowing them to undergo genetic testing.   We reviewed the characteristics, features and inheritance patterns of hereditary cancer syndromes. We also discussed genetic testing, including the appropriate family members to test, the process of testing, insurance coverage and turn-around-time for results. We discussed the implications of a negative, positive and/or variant of uncertain significant result. In order to get genetic test results in a timely manner so that Amy Howell can use these genetic test results for surgical decisions, we recommended Amy Howell pursue genetic testing for the [facility] BRCAPlus Panel.  The BRCAplus panel offered by [employer] and includes sequencing and deletion/duplication analysis for the following 8 genes: ATM, BRCA1, BRCA2, CDH1, CHEK2, PALB2, PTEN, and TP53.  Once complete, we recommend Amy Howell pursue reflex genetic testing to a more comprehensive gene panel.   The CancerNext-Expanded gene panel offered by [facility] and includes sequencing, rearrangement, and RNA analysis for the following 77 genes: AIP, ALK, APC, ATM, AXIN2, BAP1, BARD1, BLM, BMPR1A, BRCA1, BRCA2, BRIP1, CDC73, CDH1, CDK4, CDKN1B,  CDKN2A, CHEK2, CTNNA1, DICER1, FANCC, FH, FLCN, GALNT12, KIF1B, LZTR1, MAX, MEN1, MET, MLH1, MSH2, MSH3, MSH6, MUTYH, NBN, NF1, NF2, NTHL1, PALB2, PHOX2B, PMS2, POT1, PRKAR1A, PTCH1, PTEN, RAD51C, RAD51D, RB1, RECQL, RET, SDHA, SDHAF2, SDHB, SDHC, SDHD, SMAD4, SMARCA4, SMARCB1, SMARCE1, STK11, SUFU, TMEM127, TP53, TSC1, TSC2, VHL and XRCC2 (sequencing and deletion/duplication); EGFR, EGLN1, HOXB13, KIT, MITF, PDGFRA, POLD1, and POLE (sequencing only); EPCAM and GREM1 (deletion/duplication only).   Based on Amy Howell's personal history of two primary breast cancers and family history of prostate cancer, she meets medical criteria for genetic testing. Despite that she meets criteria, she may still have an out of  pocket cost. We discussed that if her out of pocket cost for testing is over $100, the laboratory should contact them to discuss self-pay prices, patient pay assistance programs, if applicable, and other billing options.   PLAN: After considering the risks, benefits, and limitations, Amy Howell provided informed consent to pursue genetic testing and the blood sample was sent to [facility] for analysis of the BRCAPlus and CancerNext-Expanded +RNAinsight Panel. Results should be available within approximately 1-2 weeks' time, at which point they will be disclosed by telephone to Amy Howell, as will any additional recommendations warranted by these results. Amy Howell will receive a summary of her genetic counseling visit and a copy of her results once available. This information will also be available in Epic.   Amy Howell questions were answered to her satisfaction today. Our contact information was provided should additional questions or concerns arise. Thank you for the referral and allowing [place] to share in the care of your patient.   Bobetta Korf M. Joette Catching, [place], [facility] Genetic Counselor [email or web address] (P) [phone]  The patient was seen for a total of 15 minutes in face-to-face  genetic counseling.  Patient was accompanied by her daughter, Amy Howell.  Drs. Lindi Adie and/or Burr Medico were available to discuss this case as needed.  _______________________________________________________________________  For Office Staff:  Number of people involved in session: 2 Was an Intern/ student involved with case: no

## 2022-06-18 ENCOUNTER — Other Ambulatory Visit: Payer: Self-pay

## 2022-06-18 ENCOUNTER — Encounter (HOSPITAL_BASED_OUTPATIENT_CLINIC_OR_DEPARTMENT_OTHER): Payer: Self-pay | Admitting: Surgery

## 2022-06-19 ENCOUNTER — Telehealth: Payer: Self-pay | Admitting: Emergency Medicine

## 2022-06-19 NOTE — Telephone Encounter (Signed)
Copied from [place] [phone]. Topic: General - Other >> Jun 19, 2022  2:11 PM Chapman Fitch wrote: Reason for CRM: [facility] called for status of surgery clearance faxed to the office yesterday / Pts surgery is Tuesday / please advise asap

## 2022-06-20 ENCOUNTER — Encounter: Payer: Self-pay | Admitting: *Deleted

## 2022-06-20 ENCOUNTER — Telehealth: Payer: Self-pay | Admitting: *Deleted

## 2022-06-20 ENCOUNTER — Telehealth: Payer: Self-pay | Admitting: Hematology and Oncology

## 2022-06-20 NOTE — Therapy (Signed)
OUTPATIENT PHYSICAL THERAPY BREAST CANCER BASELINE EVALUATION   Patient Name: Amy Howell MRN: [id number] DOB:1956-04-03, 66 y.o., female Today's Date: 06/21/2022   PT End of Session - 06/21/22 1022     Visit Number 1    Number of Visits 2    Date for PT Re-Evaluation 07/19/22    PT Start Time 0950    PT Stop Time 1018    PT Time Calculation (min) 28 min    Activity Tolerance Patient tolerated treatment well    Behavior During Therapy WFL for tasks assessed/performed             Past Medical History:  Diagnosis Date   Cataracts, bilateral    Cerebral infarction due to carotid artery stenosis (HCC)    Glaucoma    Hypertension    Stroke ([place])    no residual, "series of mini strokes"   Vertigo    Past Surgical History:  Procedure Laterality Date   ABDOMINAL HYSTERECTOMY     CARDIAC DEFIBRILLATOR PLACEMENT     gum operation     Implantable loop recorder removal  02/01/2019   MDT Reveal LINQ explantation in office by JA   IR [place]  05/07/2017   IR ANGIO INTRA EXTRACRAN SEL COM CAROTID INNOMINATE BILAT MOD SED  04/23/2017   IR ANGIO INTRA EXTRACRAN SEL COM CAROTID INNOMINATE BILAT MOD SED  08/06/2017   IR ANGIO INTRA EXTRACRAN SEL COM CAROTID INNOMINATE UNI R MOD SED  05/07/2017   IR ANGIO VERTEBRAL SEL VERTEBRAL BILAT MOD SED  04/23/2017   IR ANGIO VERTEBRAL SEL VERTEBRAL BILAT MOD SED  08/06/2017   IR ANGIOGRAM FOLLOW UP STUDY  05/07/2017   IR ANGIOGRAM SELECTIVE EACH ADDITIONAL VESSEL  05/07/2017   IR RADIOLOGIST EVAL & MGMT  04/16/2017   IR RADIOLOGIST EVAL & MGMT  05/29/2017   IR TRANSCATH/EMBOLIZ  05/07/2017   LOOP RECORDER INSERTION N/A 03/28/2017   Procedure: Loop Recorder Insertion;  Surgeon: Thompson Grayer, MD;  Location: [facility];  Service: Cardiovascular;  Laterality: N/A;   RADIOLOGY WITH ANESTHESIA N/A 05/07/2017   Procedure: EMBOLIZATION;  Surgeon: Luanne Bras, MD;  Location: [facility];  Service: Radiology;  Laterality: N/A;   RADIOLOGY WITH  ANESTHESIA N/A 08/06/2017   Procedure: RADIOLOGY WITH ANESTHESIA STENTING;  Surgeon: Luanne Bras, MD;  Location: [facility];  Service: Radiology;  Laterality: N/A;   TEE WITHOUT CARDIOVERSION N/A 03/28/2017   Procedure: TRANSESOPHAGEAL ECHOCARDIOGRAM (TEE);  Surgeon: Lelon Perla, MD;  Location: [facility];  Service: Cardiovascular;  Laterality: N/A;   Patient Active Problem List   Diagnosis Date Noted   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, right ([place]) 06/10/2022   Statin declined 12/06/2021   Colon cancer screening declined 07/15/2019   Mammogram declined 07/15/2019   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 ([place]) 01/08/2019   Tobacco dependence 03/30/2018   Glaucoma of both eyes 03/30/2018   History of loop recorder 03/30/2018   Prediabetes 04/10/2017   Cryptogenic stroke ([place])    Aneurysm of middle cerebral artery    Weight loss, unintentional    Basilar artery stenosis    Essential hypertension 03/26/2017    PCP: Karle Plumber MD  REFERRING PROVIDER: Donnie Mesa, MD  REFERRING DIAG: Rt breast cancer  THERAPY DIAG:  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   PAIN:  Are you having pain? No   PRECAUTIONS: Active CA   HAND DOMINANCE: right  WEIGHT BEARING RESTRICTIONS  No  FALLS:  Has patient fallen in last 6 months? No  LIVING ENVIRONMENT: Patient lives with: daughter  OCCUPATION: retired  LEISURE: walk with neighbor who also has cancer  PRIOR LEVEL OF FUNCTION: Independent  OBJECTIVE COGNITION:  Overall cognitive status: Within functional limits for tasks assessed    POSTURE:  Forward head and rounded shoulders posture  UPPER EXTREMITY AROM/PROM:  A/PROM RIGHT   eval   Shoulder extension 60  Shoulder flexion 153  Shoulder abduction 168  Shoulder internal rotation   Shoulder external rotation 90    (Blank rows = not tested)  A/PROM LEFT   eval  Shoulder extension   Shoulder flexion   Shoulder abduction   Shoulder internal rotation   Shoulder external rotation     (Blank rows = not tested)  CERVICAL AROM: All within normal limits:   UPPER EXTREMITY STRENGTH: WNL   LYMPHEDEMA ASSESSMENTS:   LANDMARK RIGHT   eval  10 cm proximal to olecranon process 23.3  Olecranon process 21  10 cm proximal to ulnar styloid process 16.3  Just proximal to ulnar styloid process 13.5  Across hand at thumb web space 17.3  At base of 2nd digit 5.5  (Blank rows = not tested)  LANDMARK LEFT   eval  10 cm proximal to olecranon process 23.3  Olecranon process 21  10 cm proximal to ulnar styloid process 17  Just proximal to ulnar styloid process 13.2  Across hand at thumb web space 16.5  At base of 2nd digit 5.5  (Blank rows = not tested)   L-DEX LYMPHEDEMA SCREENING: The patient was assessed using the L-Dex machine today to produce a lymphedema index baseline score. The patient will be reassessed on a regular basis (typically every 3 months) to obtain new L-Dex scores. If the score is > 6.5 points away from his/her baseline score indicating onset of subclinical lymphedema, it will be recommended to wear a compression garment for 4 weeks, 12 hours per day and then be reassessed. If the score continues to be > 6.5 points from baseline at  reassessment, we will initiate lymphedema treatment. Assessing in this manner has a 95% rate of preventing clinically significant lymphedema.  QUICK DASH SURVEY: 2.27%  PATIENT EDUCATION:  Education details: Lymphedema risk reduction and post op shoulder/posture HEP Person educated: Patient Education method: Explanation, Demonstration, Handout Education comprehension: Patient verbalized understanding and returned demonstration   HOME EXERCISE PROGRAM: Patient was instructed today in a home exercise program today for post op shoulder range of motion. These included active assist shoulder flexion in sitting, scapular retraction, wall walking with shoulder abduction, and hands behind head external rotation.  She was encouraged to do these twice a day, holding 3 seconds and repeating 5 times when permitted by her physician.   ASSESSMENT:  CLINICAL IMPRESSION: She is planning to have a Rt mastectomy and SLNB. She currently has no shoulder issues or limitations.  She will benefit from a post op PT reassessment to determine needs and from  L-Dex screens every 3 months for 2 years to detect subclinical lymphedema.  Pt will benefit from skilled therapeutic intervention to improve on the following deficits: Decreased knowledge of precautions, impaired UE functional use, pain, decreased ROM, postural dysfunction.   PT treatment/interventions: ADL/self-care home management, pt/family education, therapeutic exercise  REHAB POTENTIAL: Excellent  CLINICAL DECISION MAKING: Stable/uncomplicated  EVALUATION COMPLEXITY: Low   GOALS: Goals reviewed with patient? YES  LONG TERM GOALS: (STG=LTG)    Name Target Date Goal status  1 Pt will be able to verbalize understanding of pertinent lymphedema risk reduction practices relevant to her dx specifically related to skin care.  Baseline:  No knowledge 06/21/2022 Achieved at eval  2 Pt will be able to return demo and/or verbalize understanding of the post  op HEP related to regaining shoulder ROM. Baseline:  No knowledge 06/21/2022 Achieved at eval  3 Pt will be able to verbalize understanding of the importance of attending the post op After Breast CA Class for further lymphedema risk reduction education and therapeutic exercise.  Baseline:  No knowledge 06/21/2022 Achieved at eval  4 Pt will demo she has regained full shoulder ROM and function post operatively compared to baselines.  Baseline: See objective measurements taken today. 07/19/22      PLAN: PT FREQUENCY/DURATION: EVAL and 1 follow up appointment.   PLAN FOR NEXT SESSION: will reassess 3-4 weeks post op to determine needs.   Patient will follow up at outpatient cancer rehab 3-4 weeks following surgery.  If the patient requires physical therapy at that time, a specific plan will be dictated and sent to the referring physician for approval. The patient was educated today on appropriate basic range of motion exercises to begin post operatively and the importance of attending the After Breast Cancer class following surgery.  Patient was educated today on lymphedema risk reduction practices as it pertains to recommendations that will benefit the patient immediately following surgery.  She verbalized good understanding.    Physical Therapy Information for After Breast Cancer Surgery/Treatment:  Lymphedema is a swelling condition that you may be at risk for in your arm if you have lymph nodes removed from the armpit area.  After a sentinel node biopsy, the risk is approximately 5-9% and is higher after an axillary node dissection.  There is treatment available for this condition and it is not life-threatening.  Contact your physician or physical therapist with concerns. You may begin the 4 shoulder/posture exercises (see additional sheet) when permitted by your physician (typically a week after surgery).  If you have drains, you may need to wait until those are removed before beginning range of  motion exercises.  A general recommendation is to not lift your arms above shoulder height until drains are removed.  These exercises should be done to your tolerance and gently.  This is not a "no pain/no gain" type of recovery so listen to your body and stretch into the range of motion that you can tolerate, stopping if you have pain.  If you are having immediate reconstruction, ask your plastic surgeon about doing exercises as he or she may want you to wait. We encourage you to attend the free one time ABC (After Breast Cancer) class offered by [facility].  You will learn information related to lymphedema risk, prevention and treatment and additional exercises to regain mobility following surgery.  You can call [phone] for more information.  This is offered the 1st and 3rd Monday of each month.  You only attend the class one time. While undergoing any medical procedure or treatment, try to avoid blood pressure being taken or needle sticks from occurring on the arm on the side of cancer.   This recommendation begins after surgery and continues for the rest of your life.  This may help reduce your risk of getting lymphedema (swelling in your arm). An excellent resource for those seeking information on lymphedema is the National Lymphedema Network's web site. It can be accessed at [place].org If you notice swelling in your hand, arm or breast at any time following surgery (even if it is many years from now), please contact your doctor or physical therapist to discuss this.  Lymphedema can be treated at any time but it is easier for you if it is treated early on.  If you feel like your shoulder motion is not returning to normal in a reasonable amount of time, please contact your surgeon or physical therapist.  [place] [phone]. [address], [address],  [place] [address]  ABC CLASS After Breast Cancer Class  After Breast Cancer  Class is a specially designed exercise class to assist you in a safe recover after having breast cancer surgery.  In this class you will learn how to get back to full function whether your drains were just removed or if you had surgery a month ago.  This one-time class is held the 1st and 3rd Monday of every month from 11:00 a.m. until 12:00 noon virtually.  This class is FREE and space is limited. For more information or to register for the next available class, call [phone].  Class Goals  Understand specific stretches to improve the flexibility of you chest and shoulder. Learn ways to safely strengthen your upper body and improve your posture. Understand the warning signs of infection and why you may be at risk for an arm infection. Learn about Lymphedema and prevention.  ** You do not attend this class until after surgery.  Drains must be removed to participate  Patient was instructed today in a home exercise program today for post op shoulder range of motion. These included active assist shoulder flexion in sitting, scapular retraction, wall walking with shoulder abduction, and hands behind head external rotation.  She was encouraged to do these twice a day, holding 3 seconds and repeating 5 times when permitted by her physician.    Stark Bray, PT 06/21/2022, 10:23 AM

## 2022-06-20 NOTE — Telephone Encounter (Signed)
Left VM for a return phone call to follow up from [facility] 7/12 and assess navigation needs.

## 2022-06-20 NOTE — Telephone Encounter (Signed)
Paperwork has been faxed over.

## 2022-06-20 NOTE — Telephone Encounter (Signed)
.  Called patient to schedule appointment per 7/20 inbasket, patient is aware of date and time.   

## 2022-06-21 ENCOUNTER — Telehealth: Payer: Self-pay | Admitting: Radiology

## 2022-06-21 ENCOUNTER — Encounter (HOSPITAL_BASED_OUTPATIENT_CLINIC_OR_DEPARTMENT_OTHER)
Admission: RE | Admit: 2022-06-21 | Discharge: 2022-06-21 | Disposition: A | Discharge status: Home / Self Care | Payer: Medicare (Managed Care) | Source: Ambulatory Visit | Attending: Surgery | Admitting: Surgery

## 2022-06-21 ENCOUNTER — Other Ambulatory Visit: Payer: Self-pay

## 2022-06-21 ENCOUNTER — Encounter: Payer: Self-pay | Admitting: Rehabilitation

## 2022-06-21 ENCOUNTER — Ambulatory Visit: Payer: Medicare (Managed Care) | Attending: Surgery | Admitting: Rehabilitation

## 2022-06-21 DIAGNOSIS — R293 Abnormal posture: Secondary | ICD-10-CM | POA: Insufficient documentation

## 2022-06-21 DIAGNOSIS — C50311 Malignant neoplasm of lower-inner quadrant of right female breast: Secondary | ICD-10-CM | POA: Diagnosis not present

## 2022-06-21 DIAGNOSIS — I10 Essential (primary) hypertension: Secondary | ICD-10-CM | POA: Insufficient documentation

## 2022-06-21 DIAGNOSIS — Z01810 Encounter for preprocedural cardiovascular examination: Secondary | ICD-10-CM | POA: Diagnosis not present

## 2022-06-21 NOTE — Progress Notes (Signed)

## 2022-06-21 NOTE — Telephone Encounter (Signed)
MTG-015 - Tissue and Bodily Fluids: Translational Medicine: Discovery and Evaluation of Biomarkers/Pharmacogenomics for the Diagnosis and Personalized Management of Patients    06/21/22  PHONE CALL: Spoke with patient's daughter, [name]. Phone call to follow up on potential interest in the above mentioned study. Patient is currently in an appointment and has an additional appt following. [name] stated she would discuss with her mom and they would return my call to discuss further. Thanked them for their time.   Carol Ada, RT(R)(T) Clinical Research Coordinator

## 2022-06-23 NOTE — Anesthesia Preprocedure Evaluation (Signed)
Anesthesia Evaluation  Patient identified by MRN, date of birth, ID band Patient awake    Reviewed: Allergy & Precautions, NPO status , Patient's Chart, lab work & pertinent test results  History of Anesthesia Complications Negative for: history of anesthetic complications  Airway Mallampati: II  TM Distance: >3 FB Neck ROM: Full    Dental  (+) Missing,    Pulmonary Current Smoker and Patient abstained from smoking.,    Pulmonary exam normal        Cardiovascular hypertension, Pt. on medications Normal cardiovascular exam     Neuro/Psych CVA (on Brilinta), No Residual Symptoms negative psych ROS   GI/Hepatic negative GI ROS, Neg liver ROS,   Endo/Other  diabetes, Type 2  Renal/GU Renal InsufficiencyRenal disease  negative genitourinary   Musculoskeletal negative musculoskeletal ROS (+)   Abdominal   Peds  Hematology negative hematology ROS (+)   Anesthesia Other Findings Right breast ca, glaucoma  Reproductive/Obstetrics negative OB ROS                            Anesthesia Physical Anesthesia Plan  ASA: 3  Anesthesia Plan: General   Post-op Pain Management: Tylenol PO (pre-op)* and Regional block*   Induction: Intravenous  PONV Risk Score and Plan: 3 and Treatment may vary due to age or medical condition, Ondansetron, Dexamethasone, Midazolam and Propofol infusion  Airway Management Planned: LMA  Additional Equipment: None  Intra-op Plan:   Post-operative Plan: Extubation in OR  Informed Consent: I have reviewed the patients History and Physical, chart, labs and discussed the procedure including the risks, benefits and alternatives for the proposed anesthesia with the patient or authorized representative who has indicated his/her understanding and acceptance.     Dental advisory given  Plan Discussed with: CRNA  Anesthesia Plan Comments:        Anesthesia Quick  Evaluation

## 2022-06-25 ENCOUNTER — Ambulatory Visit (HOSPITAL_COMMUNITY)
Admission: RE | Admit: 2022-06-25 | Discharge: 2022-06-25 | Disposition: A | Discharge status: Home / Self Care | Payer: Medicare (Managed Care) | Source: Ambulatory Visit | Attending: Surgery | Admitting: Surgery

## 2022-06-25 ENCOUNTER — Ambulatory Visit (HOSPITAL_BASED_OUTPATIENT_CLINIC_OR_DEPARTMENT_OTHER): Payer: Medicare (Managed Care) | Admitting: Anesthesiology

## 2022-06-25 ENCOUNTER — Encounter (HOSPITAL_BASED_OUTPATIENT_CLINIC_OR_DEPARTMENT_OTHER)
Admission: RE | Disposition: A | Discharge status: Home / Self Care | Payer: Self-pay | Source: Home / Self Care | Attending: Surgery

## 2022-06-25 ENCOUNTER — Other Ambulatory Visit: Payer: Self-pay

## 2022-06-25 ENCOUNTER — Encounter (HOSPITAL_BASED_OUTPATIENT_CLINIC_OR_DEPARTMENT_OTHER): Payer: Self-pay | Admitting: Surgery

## 2022-06-25 ENCOUNTER — Ambulatory Visit (HOSPITAL_BASED_OUTPATIENT_CLINIC_OR_DEPARTMENT_OTHER)
Admission: RE | Admit: 2022-06-25 | Discharge: 2022-06-26 | Disposition: A | Discharge status: Home / Self Care | Payer: Medicare (Managed Care) | Attending: Surgery | Admitting: Surgery

## 2022-06-25 DIAGNOSIS — Z7982 Long term (current) use of aspirin: Secondary | ICD-10-CM | POA: Insufficient documentation

## 2022-06-25 DIAGNOSIS — C50911 Malignant neoplasm of unspecified site of right female breast: Secondary | ICD-10-CM | POA: Diagnosis not present

## 2022-06-25 DIAGNOSIS — F17200 Nicotine dependence, unspecified, uncomplicated: Secondary | ICD-10-CM

## 2022-06-25 DIAGNOSIS — G8918 Other acute postprocedural pain: Secondary | ICD-10-CM | POA: Diagnosis not present

## 2022-06-25 DIAGNOSIS — E119 Type 2 diabetes mellitus without complications: Secondary | ICD-10-CM | POA: Insufficient documentation

## 2022-06-25 DIAGNOSIS — Z8673 Personal history of transient ischemic attack (TIA), and cerebral infarction without residual deficits: Secondary | ICD-10-CM | POA: Diagnosis not present

## 2022-06-25 DIAGNOSIS — D0511 Intraductal carcinoma in situ of right breast: Secondary | ICD-10-CM | POA: Diagnosis not present

## 2022-06-25 DIAGNOSIS — I119 Hypertensive heart disease without heart failure: Secondary | ICD-10-CM | POA: Diagnosis not present

## 2022-06-25 DIAGNOSIS — C50811 Malignant neoplasm of overlapping sites of right female breast: Secondary | ICD-10-CM | POA: Diagnosis not present

## 2022-06-25 DIAGNOSIS — I10 Essential (primary) hypertension: Secondary | ICD-10-CM | POA: Diagnosis not present

## 2022-06-25 DIAGNOSIS — Z170 Estrogen receptor positive status [ER+]: Secondary | ICD-10-CM | POA: Insufficient documentation

## 2022-06-25 DIAGNOSIS — F17210 Nicotine dependence, cigarettes, uncomplicated: Secondary | ICD-10-CM | POA: Diagnosis not present

## 2022-06-25 DIAGNOSIS — Z7902 Long term (current) use of antithrombotics/antiplatelets: Secondary | ICD-10-CM | POA: Diagnosis not present

## 2022-06-25 DIAGNOSIS — C50311 Malignant neoplasm of lower-inner quadrant of right female breast: Secondary | ICD-10-CM

## 2022-06-25 HISTORY — PX: MASTECTOMY W/ SENTINEL NODE BIOPSY: SHX2001

## 2022-06-25 SURGERY — MASTECTOMY WITH SENTINEL LYMPH NODE BIOPSY
Anesthesia: General | Site: Breast | Laterality: Right

## 2022-06-25 MED ORDER — PHENYLEPHRINE 80 MCG/ML (10ML) SYRINGE FOR IV PUSH (FOR BLOOD PRESSURE SUPPORT)
PREFILLED_SYRINGE | INTRAVENOUS | Status: AC
  Filled 2022-06-25: qty 10

## 2022-06-25 MED ORDER — 0.9 % SODIUM CHLORIDE (POUR BTL) OPTIME
TOPICAL | Status: DC | PRN
  Administered 2022-06-25: 300 mL

## 2022-06-25 MED ORDER — ACETAMINOPHEN 500 MG PO TABS
ORAL_TABLET | ORAL | Status: AC
  Filled 2022-06-25: qty 2

## 2022-06-25 MED ORDER — BUPIVACAINE-EPINEPHRINE (PF) 0.5% -1:200000 IJ SOLN
INTRAMUSCULAR | Status: DC | PRN
  Administered 2022-06-25: 20 mL via PERINEURAL

## 2022-06-25 MED ORDER — CEFAZOLIN SODIUM-DEXTROSE 2-4 GM/100ML-% IV SOLN
2.0000 g | Freq: Three times a day (TID) | INTRAVENOUS | Status: AC
  Administered 2022-06-25: 2 g via INTRAVENOUS
  Filled 2022-06-25: qty 100

## 2022-06-25 MED ORDER — DIPHENHYDRAMINE HCL 12.5 MG/5ML PO ELIX: 12.5000 mg | ORAL_SOLUTION | Freq: Four times a day (QID) | ORAL | Status: DC | PRN

## 2022-06-25 MED ORDER — OXYCODONE HCL 5 MG PO TABS: 5.0000 mg | ORAL_TABLET | ORAL | Status: DC | PRN

## 2022-06-25 MED ORDER — ONDANSETRON 4 MG PO TBDP: 4.0000 mg | ORAL_TABLET | Freq: Four times a day (QID) | ORAL | Status: DC | PRN

## 2022-06-25 MED ORDER — EPHEDRINE 5 MG/ML INJ
INTRAVENOUS | Status: AC
  Filled 2022-06-25: qty 5

## 2022-06-25 MED ORDER — MIDAZOLAM HCL 2 MG/2ML IJ SOLN
INTRAMUSCULAR | Status: AC
  Filled 2022-06-25: qty 2

## 2022-06-25 MED ORDER — OXYCODONE HCL 5 MG/5ML PO SOLN: 5.0000 mg | Freq: Once | ORAL | Status: DC | PRN

## 2022-06-25 MED ORDER — AMLODIPINE BESYLATE 10 MG PO TABS: 10.0000 mg | ORAL_TABLET | Freq: Every day | ORAL | Status: DC

## 2022-06-25 MED ORDER — ONDANSETRON HCL 4 MG/2ML IJ SOLN: 4.0000 mg | Freq: Four times a day (QID) | INTRAMUSCULAR | Status: DC | PRN

## 2022-06-25 MED ORDER — FENTANYL CITRATE (PF) 100 MCG/2ML IJ SOLN
50.0000 ug | Freq: Once | INTRAMUSCULAR | Status: AC
  Administered 2022-06-25: 50 ug via INTRAVENOUS

## 2022-06-25 MED ORDER — TECHNETIUM TC 99M TILMANOCEPT KIT
1.0000 | PACK | Freq: Once | INTRAVENOUS | Status: AC | PRN
  Administered 2022-06-25: 1 via INTRADERMAL

## 2022-06-25 MED ORDER — MORPHINE SULFATE (PF) 4 MG/ML IV SOLN: 2.0000 mg | INTRAVENOUS | Status: DC | PRN

## 2022-06-25 MED ORDER — CEFAZOLIN SODIUM-DEXTROSE 2-4 GM/100ML-% IV SOLN
INTRAVENOUS | Status: AC
  Filled 2022-06-25: qty 100

## 2022-06-25 MED ORDER — BUPIVACAINE-EPINEPHRINE (PF) 0.25% -1:200000 IJ SOLN
INTRAMUSCULAR | Status: AC
  Filled 2022-06-25: qty 30

## 2022-06-25 MED ORDER — FENTANYL CITRATE (PF) 100 MCG/2ML IJ SOLN
INTRAMUSCULAR | Status: AC
  Filled 2022-06-25: qty 2

## 2022-06-25 MED ORDER — LACTATED RINGERS IV SOLN: INTRAVENOUS | Status: DC

## 2022-06-25 MED ORDER — ONDANSETRON HCL 4 MG/2ML IJ SOLN
INTRAMUSCULAR | Status: AC
  Filled 2022-06-25: qty 2

## 2022-06-25 MED ORDER — OXYCODONE HCL 5 MG PO TABS: 5.0000 mg | ORAL_TABLET | Freq: Once | ORAL | Status: DC | PRN

## 2022-06-25 MED ORDER — LIDOCAINE 2% (20 MG/ML) 5 ML SYRINGE
INTRAMUSCULAR | Status: AC
  Filled 2022-06-25: qty 5

## 2022-06-25 MED ORDER — CEFAZOLIN SODIUM-DEXTROSE 2-4 GM/100ML-% IV SOLN
2.0000 g | INTRAVENOUS | Status: AC
  Administered 2022-06-25: 2 g via INTRAVENOUS

## 2022-06-25 MED ORDER — PHENYLEPHRINE HCL (PRESSORS) 10 MG/ML IV SOLN
INTRAVENOUS | Status: AC
  Filled 2022-06-25: qty 1

## 2022-06-25 MED ORDER — FENTANYL CITRATE (PF) 100 MCG/2ML IJ SOLN: 25.0000 ug | INTRAMUSCULAR | Status: DC | PRN

## 2022-06-25 MED ORDER — LATANOPROST 0.005 % OP SOLN: 1.0000 [drp] | Freq: Every day | OPHTHALMIC | Status: DC

## 2022-06-25 MED ORDER — FENTANYL CITRATE (PF) 100 MCG/2ML IJ SOLN
INTRAMUSCULAR | Status: DC | PRN
  Administered 2022-06-25: 50 ug via INTRAVENOUS

## 2022-06-25 MED ORDER — BUPIVACAINE LIPOSOME 1.3 % IJ SUSP
INTRAMUSCULAR | Status: DC | PRN
  Administered 2022-06-25: 10 mL via PERINEURAL

## 2022-06-25 MED ORDER — DEXAMETHASONE SODIUM PHOSPHATE 4 MG/ML IJ SOLN
INTRAMUSCULAR | Status: DC | PRN
  Administered 2022-06-25: 4 mg via INTRAVENOUS

## 2022-06-25 MED ORDER — BRIMONIDINE TARTRATE 0.2 % OP SOLN: 1.0000 [drp] | Freq: Two times a day (BID) | OPHTHALMIC | Status: DC

## 2022-06-25 MED ORDER — AMISULPRIDE (ANTIEMETIC) 5 MG/2ML IV SOLN: 10.0000 mg | Freq: Once | INTRAVENOUS | Status: DC | PRN

## 2022-06-25 MED ORDER — ATROPINE SULFATE 0.4 MG/ML IV SOLN
INTRAVENOUS | Status: AC
  Filled 2022-06-25: qty 1

## 2022-06-25 MED ORDER — MIDAZOLAM HCL 2 MG/2ML IJ SOLN
1.0000 mg | Freq: Once | INTRAMUSCULAR | Status: AC
  Administered 2022-06-25: 1 mg via INTRAVENOUS

## 2022-06-25 MED ORDER — SUCCINYLCHOLINE CHLORIDE 200 MG/10ML IV SOSY
PREFILLED_SYRINGE | INTRAVENOUS | Status: AC
  Filled 2022-06-25: qty 10

## 2022-06-25 MED ORDER — MAGTRACE LYMPHATIC TRACER
INTRAMUSCULAR | Status: DC | PRN
  Administered 2022-06-25: 2 mL via INTRAMUSCULAR

## 2022-06-25 MED ORDER — CHLORHEXIDINE GLUCONATE CLOTH 2 % EX PADS: 6.0000 | MEDICATED_PAD | Freq: Once | CUTANEOUS | Status: DC

## 2022-06-25 MED ORDER — LIDOCAINE HCL (CARDIAC) PF 100 MG/5ML IV SOSY
PREFILLED_SYRINGE | INTRAVENOUS | Status: DC | PRN
  Administered 2022-06-25: 100 mg via INTRAVENOUS
  Administered 2022-06-25: 60 mg via INTRAVENOUS

## 2022-06-25 MED ORDER — DEXAMETHASONE SODIUM PHOSPHATE 10 MG/ML IJ SOLN
INTRAMUSCULAR | Status: AC
  Filled 2022-06-25: qty 1

## 2022-06-25 MED ORDER — PHENYLEPHRINE HCL-NACL 20-0.9 MG/250ML-% IV SOLN
INTRAVENOUS | Status: DC | PRN
  Administered 2022-06-25: 30 ug/min via INTRAVENOUS

## 2022-06-25 MED ORDER — TRAMADOL HCL 50 MG PO TABS: 50.0000 mg | ORAL_TABLET | Freq: Four times a day (QID) | ORAL | Status: DC | PRN

## 2022-06-25 MED ORDER — ACETAMINOPHEN 325 MG PO TABS: 650.0000 mg | ORAL_TABLET | Freq: Four times a day (QID) | ORAL | Status: DC | PRN

## 2022-06-25 MED ORDER — DIPHENHYDRAMINE HCL 50 MG/ML IJ SOLN: 12.5000 mg | Freq: Four times a day (QID) | INTRAMUSCULAR | Status: DC | PRN

## 2022-06-25 MED ORDER — SODIUM CHLORIDE 0.9 % IV SOLN: INTRAVENOUS | Status: DC

## 2022-06-25 MED ORDER — EPHEDRINE SULFATE (PRESSORS) 50 MG/ML IJ SOLN
INTRAMUSCULAR | Status: DC | PRN
  Administered 2022-06-25: 10 mg via INTRAVENOUS

## 2022-06-25 MED ORDER — PHENYLEPHRINE HCL-NACL 20-0.9 MG/250ML-% IV SOLN: INTRAVENOUS | Status: DC | PRN

## 2022-06-25 MED ORDER — PROPOFOL 500 MG/50ML IV EMUL
INTRAVENOUS | Status: DC | PRN
  Administered 2022-06-25: 25 ug/kg/min via INTRAVENOUS

## 2022-06-25 MED ORDER — ACETAMINOPHEN 325 MG RE SUPP: 650.0000 mg | Freq: Four times a day (QID) | RECTAL | Status: DC | PRN

## 2022-06-25 MED ORDER — DORZOLAMIDE HCL 2 % OP SOLN: 1.0000 [drp] | Freq: Two times a day (BID) | OPHTHALMIC | Status: DC

## 2022-06-25 MED ORDER — ACETAMINOPHEN 500 MG PO TABS
1000.0000 mg | ORAL_TABLET | ORAL | Status: AC
  Administered 2022-06-25: 1000 mg via ORAL

## 2022-06-25 SURGICAL SUPPLY — 54 items
APPLIER CLIP 9.375 MED OPEN (MISCELLANEOUS) ×2
BENZOIN TINCTURE PRP APPL 2/3 (GAUZE/BANDAGES/DRESSINGS) ×2 IMPLANT
BINDER BREAST MEDIUM (GAUZE/BANDAGES/DRESSINGS) ×1 IMPLANT
BLADE CLIPPER SURG (BLADE) IMPLANT
BLADE HEX COATED 2.75 (ELECTRODE) ×2 IMPLANT
BLADE SURG 10 STRL SS (BLADE) ×2 IMPLANT
BLADE SURG 15 STRL LF DISP TIS (BLADE) ×1 IMPLANT
BLADE SURG 15 STRL SS (BLADE) ×1
CANISTER SUCT 1200ML W/VALVE (MISCELLANEOUS) ×2 IMPLANT
CHLORAPREP W/TINT 26 (MISCELLANEOUS) ×2 IMPLANT
CLIP APPLIE 9.375 MED OPEN (MISCELLANEOUS) IMPLANT
COVER BACK TABLE 60X90IN (DRAPES) ×2 IMPLANT
COVER MAYO STAND STRL (DRAPES) ×2 IMPLANT
COVER PROBE W GEL 5X96 (DRAPES) ×3 IMPLANT
DRAIN CHANNEL 19F RND (DRAIN) ×2 IMPLANT
DRAIN HEMOVAC 1/8 X 5 (WOUND CARE) IMPLANT
DRAPE LAPAROSCOPIC ABDOMINAL (DRAPES) ×2 IMPLANT
DRAPE UTILITY XL STRL (DRAPES) ×2 IMPLANT
ELECT REM PT RETURN 9FT ADLT (ELECTROSURGICAL) ×2
ELECTRODE REM PT RTRN 9FT ADLT (ELECTROSURGICAL) ×1 IMPLANT
EVACUATOR SILICONE 100CC (DRAIN) ×2 IMPLANT
GAUZE SPONGE 4X4 12PLY STRL (GAUZE/BANDAGES/DRESSINGS) ×2 IMPLANT
GAUZE SPONGE 4X4 12PLY STRL LF (GAUZE/BANDAGES/DRESSINGS) IMPLANT
GLOVE BIO SURGEON STRL SZ7 (GLOVE) ×2 IMPLANT
GLOVE BIOGEL PI IND STRL 7.0 (GLOVE) IMPLANT
GLOVE BIOGEL PI IND STRL 7.5 (GLOVE) ×1 IMPLANT
GLOVE BIOGEL PI INDICATOR 7.0 (GLOVE) ×3
GLOVE BIOGEL PI INDICATOR 7.5 (GLOVE) ×1
GLOVE SURG SS PI 7.0 STRL IVOR (GLOVE) ×2 IMPLANT
GOWN STRL REUS W/ TWL LRG LVL3 (GOWN DISPOSABLE) ×1 IMPLANT
GOWN STRL REUS W/TWL LRG LVL3 (GOWN DISPOSABLE) ×4
NDL HYPO 25X1 1.5 SAFETY (NEEDLE) ×2 IMPLANT
NDL SAFETY ECLIPSE 18X1.5 (NEEDLE) ×1 IMPLANT
NEEDLE HYPO 18GX1.5 SHARP (NEEDLE) ×1
NEEDLE HYPO 25X1 1.5 SAFETY (NEEDLE) ×4 IMPLANT
NS IRRIG 1000ML POUR BTL (IV SOLUTION) IMPLANT
PACK BASIN DAY SURGERY FS (CUSTOM PROCEDURE TRAY) ×2 IMPLANT
PENCIL SMOKE EVACUATOR (MISCELLANEOUS) ×3 IMPLANT
PIN SAFETY STERILE (MISCELLANEOUS) ×2 IMPLANT
SLEEVE SCD COMPRESS KNEE MED (STOCKING) ×2 IMPLANT
SPIKE FLUID TRANSFER (MISCELLANEOUS) ×2 IMPLANT
SPONGE T-LAP 18X18 ~~LOC~~+RFID (SPONGE) ×3 IMPLANT
SPONGE T-LAP 4X18 ~~LOC~~+RFID (SPONGE) IMPLANT
STAPLER VISISTAT 35W (STAPLE) ×2 IMPLANT
STRIP CLOSURE SKIN 1/2X4 (GAUZE/BANDAGES/DRESSINGS) ×2 IMPLANT
SUT ETHILON 2 0 FS 18 (SUTURE) ×2 IMPLANT
SUT SILK 2 0 SH (SUTURE) ×2 IMPLANT
SUT VICRYL 3-0 CR8 SH (SUTURE) ×2 IMPLANT
SYR CONTROL 10ML LL (SYRINGE) ×2 IMPLANT
TOWEL GREEN STERILE FF (TOWEL DISPOSABLE) ×4 IMPLANT
TRACER MAGTRACE VIAL (MISCELLANEOUS) ×1 IMPLANT
TRAY FAXITRON CT DISP (TRAY / TRAY PROCEDURE) ×1 IMPLANT
TUBE CONNECTING 20X1/4 (TUBING) ×2 IMPLANT
YANKAUER SUCT BULB TIP NO VENT (SUCTIONS) ×2 IMPLANT

## 2022-06-25 NOTE — Anesthesia Procedure Notes (Signed)
Procedure Name: LMA Insertion Date/Time: 06/25/2022 7:47 AM  Performed by: Willa Frater, CRNAPre-anesthesia Checklist: Patient identified, Emergency Drugs available, Suction available and Patient being monitored Patient Re-evaluated:Patient Re-evaluated prior to induction Oxygen Delivery Method: Circle System Utilized Preoxygenation: Pre-oxygenation with 100% oxygen Induction Type: IV induction Ventilation: Mask ventilation without difficulty LMA: LMA inserted LMA Size: 3.0 Number of attempts: 1 Airway Equipment and Method: bite block Placement Confirmation: positive ETCO2 Tube secured with: Tape Dental Injury: Teeth and Oropharynx as per pre-operative assessment

## 2022-06-25 NOTE — Transfer of Care (Signed)
Immediate Anesthesia Transfer of Care Note  Patient: Amy Howell  Procedure(s) Performed: RIGHT MASTECTOMY WITH AXILLARY SENTINEL LYMPH NODE BIOPSY (Right: Breast)  Patient Location: PACU  Anesthesia Type:GA combined with regional for post-op pain  Level of Consciousness: sedated  Airway & Oxygen Therapy: Patient Spontanous Breathing and Patient connected to face mask oxygen  Post-op Assessment: Report given to RN and Post -op Vital signs reviewed and stable  Post vital signs: Reviewed and stable  Last Vitals:  Vitals Value Taken Time  BP    Temp    Pulse 75 06/25/22 0904  Resp 20 06/25/22 0904  SpO2 100 % 06/25/22 0904  Vitals shown include unvalidated device data.  Last Pain:  Vitals:   06/25/22 0640  TempSrc: Oral  PainSc: 0-No pain         Complications: No notable events documented.

## 2022-06-25 NOTE — Interval H&P Note (Signed)
History and Physical Interval Note:  06/25/2022 7:07 AM  Amy Howell  has presented today for surgery, with the diagnosis of RIGHT BREAST CANCER.  The various methods of treatment have been discussed with the patient and family. After consideration of risks, benefits and other options for treatment, the patient has consented to  Procedure(s) with comments: [place] (Right) - LMA & PEC BLOCK as a surgical intervention.  The patient's history has been reviewed, patient examined, no change in status, stable for surgery.  I have reviewed the patient's chart and labs.  Questions were answered to the patient's satisfaction.     [name]

## 2022-06-25 NOTE — Anesthesia Postprocedure Evaluation (Signed)
Anesthesia Post Note  Patient: English as a second language teacher  Procedure(s) Performed: RIGHT MASTECTOMY WITH AXILLARY SENTINEL LYMPH NODE BIOPSY (Right: Breast)     Patient location during evaluation: PACU Anesthesia Type: General Level of consciousness: awake and alert Pain management: pain level controlled Vital Signs Assessment: post-procedure vital signs reviewed and stable Respiratory status: spontaneous breathing, nonlabored ventilation and respiratory function stable Cardiovascular status: blood pressure returned to baseline Postop Assessment: no apparent nausea or vomiting Anesthetic complications: no   No notable events documented.  Last Vitals:  Vitals:   06/25/22 0905 06/25/22 0915  BP: (!) 141/67 134/67  Pulse: 77 72  Resp: 17 20  Temp:  (!) 36.3 C  SpO2: 100% 100%    Last Pain:  Vitals:   06/25/22 0905  TempSrc:   PainSc: 0-No pain                 Marthenia Rolling

## 2022-06-25 NOTE — Op Note (Signed)
Preop diagnosis: Right breast invasive lobular carcinoma, right breast ductal carcinoma in situ Postop diagnosis: Same Procedure performed: Right mastectomy, right axillary sentinel lymph node biopsy, mag trace injection Surgeon:Chase Arnall K Mortimer Bair Anesthesia: General via LMA, pec block Indications:This is a 66 year old female status post coiling of a cerebral aneurysm currently anticoagulated on Brilinta and aspirin who presents after a recent screening mammogram.  This revealed 2 areas of concern in the right breast.  She had 1 mass at 4:00 that measured 1.4 cm x 0.9 x 1.2 cm.  This is located 2 cm from the nipple.  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  She had a second mass located at 6:00, 3 cm from the nipple measuring 2.4 x 0.8 x 1.4 cm.  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  No family history of breast cancer.  She is accompanied by her daughter.  The patient states that she has felt some firmness in the lower right breast for several months.  She has no symptoms on the left.  After thorough discussion and breast MDC, the patient is opted for right mastectomy with sentinel lymph node biopsy.  Description of procedure: Prior to coming to the operating room, the patient was injected with radiotracer in the preoperative area by radiology.  She was brought to the operating room and placed in the supine position on the operating room table.  After an adequate level of general anesthesia was obtained, we injected mag trace in the right upper outer quadrant of the breast.  Her right chest and axilla were prepped with ChloraPrep and draped in sterile fashion.  A timeout was taken to ensure the proper patient and proper procedure.  Using the skin marker, I outlined an elliptical incision to include the nipple areolar complex.  We made our skin incision with a #10 scalpel.  We raised skin flaps beginning inferiorly.  We  dissected down to the inframammary crease.  Medially we dissected to the edge of the sternum.  Laterally we dissected to the anterior edge of the latissimus dorsi.  We then dissected superiorly to the infraclavicular chest wall.  We then dissected the breast tissue off of the underlying pectoralis muscle, taking the anterior pectoralis fascia with the specimen.  We dissected from medial to lateral.  As we entered the axilla, I interrogated the axilla with the Sentimag probe.  We identified an area of increased activity.  This was confirmed with the neoprobe.  We excised a cluster of 2 lymph nodes that showed increased levels of magnetic and radioactivity.  These were sent together as sentinel lymph nodes #1 and 2.  There was minimal background activity.  We then irrigated the entire cavity thoroughly and inspected for hemostasis.  Once we are satisfied with hemostasis a #19 [place] drain was brought in through a stab incision laterally.  This was secured with 2-0 Ethilon.  We closed the wound with multiple interrupted subcutaneous 3-0 Vicryl.  4-0 Monocryl was used to close the skin in a subcuticular fashion.  Benzoin and Steri-Strips were applied.  The drain was placed to suction.  A breast binder was applied.  The patient was then extubated and brought to the recovery room in stable condition.  All sponge, instrument, and needle counts are correct.  Imogene Burn. Georgette Dover, MD, [facility] Surgery  General Surgery   06/25/2022 8:59 AM

## 2022-06-25 NOTE — Anesthesia Procedure Notes (Signed)
Anesthesia Regional Block: Pectoralis block   Pre-Anesthetic Checklist: , timeout performed,  Correct Patient, Correct Site, Correct Laterality,  Correct Procedure, Correct Position, site marked,  Risks and benefits discussed,  Pre-op evaluation,  At surgeon's request and post-op pain management  Laterality: Right  Prep: Maximum Sterile Barrier Precautions used, chloraprep       Needles:  Injection technique: Single-shot  Needle Type: Echogenic Stimulator Needle     Needle Length: 9cm  Needle Gauge: 22     Additional Needles:   Procedures:,,,, ultrasound used (permanent image in chart),,    Narrative:  Start time: 06/25/2022 7:02 AM End time: 06/25/2022 7:05 AM Injection made incrementally with aspirations every 5 mL.  Performed by: Personally  Anesthesiologist: Brennan Bailey, MD  Additional Notes: Risks, benefits, and alternative discussed. Patient gave consent for procedure. Patient prepped and draped in sterile fashion. Sedation administered, patient remains easily responsive to voice. Relevant anatomy identified with ultrasound guidance. Local anesthetic given in 5cc increments with no signs or symptoms of intravascular injection. No pain or paraesthesias with injection. Patient monitored throughout procedure with signs of LAST or immediate complications. Tolerated well. Ultrasound image placed in chart.  Tawny Asal, MD

## 2022-06-25 NOTE — Progress Notes (Signed)
Assisted Dr. Daiva Huge with right, pectoralis, ultrasound guided block. Side rails up, monitors on throughout procedure. See vital signs in flow sheet. Tolerated Procedure well.

## 2022-06-26 ENCOUNTER — Other Ambulatory Visit (HOSPITAL_COMMUNITY): Payer: Self-pay

## 2022-06-26 ENCOUNTER — Encounter (HOSPITAL_BASED_OUTPATIENT_CLINIC_OR_DEPARTMENT_OTHER): Payer: Self-pay | Admitting: Surgery

## 2022-06-26 DIAGNOSIS — C50811 Malignant neoplasm of overlapping sites of right female breast: Secondary | ICD-10-CM | POA: Diagnosis not present

## 2022-06-26 MED ORDER — OXYCODONE HCL 5 MG PO TABS
5.0000 mg | ORAL_TABLET | Freq: Four times a day (QID) | ORAL | 0 refills | Status: DC | PRN
  Filled 2022-06-26: qty 20, 5d supply, fill #0

## 2022-06-26 NOTE — Discharge Summary (Signed)
Physician Discharge Summary  Patient ID: Amy Howell MRN: [id number] DOB/AGE: January 18, 1956 66 y.o.  Admit date: 06/25/2022 Discharge date: 06/26/2022  Admission Diagnoses:  Discharge Diagnoses:  Principal Problem:   Invasive lobular carcinoma of breast, stage 1, right [facility])   Discharged Condition: good  Hospital Course: Right mastectomy with SLNB 06/25/22.  She did well overnight with minimal drain output and required no pain meds.       Treatments: surgery: right mastectomy with SLNB  Discharge Exam: Blood pressure (!) 171/83, pulse 87, temperature (!) 97.4 F (36.3 C), resp. rate 16, height '5\' 1"'$  (1.549 m), weight 46 kg, SpO2 97 %. General appearance: alert, cooperative, and no distress Chest wall: right chest - skin flaps viable, minimal serosanguinous drainage in drain  Disposition: Discharge disposition: 01-Home or Self Care       Discharge Instructions     Call MD for:  persistant nausea and vomiting   Complete by: As directed    Call MD for:  persistant nausea and vomiting   Complete by: As directed    Call MD for:  redness, tenderness, or signs of infection (pain, swelling, redness, odor or green/yellow discharge around incision site)   Complete by: As directed    Call MD for:  redness, tenderness, or signs of infection (pain, swelling, redness, odor or green/yellow discharge around incision site)   Complete by: As directed    Call MD for:  severe uncontrolled pain   Complete by: As directed    Call MD for:  severe uncontrolled pain   Complete by: As directed    Call MD for:  temperature >100.4   Complete by: As directed    Call MD for:  temperature >100.4   Complete by: As directed    Diet general   Complete by: As directed    Discharge wound care:   Complete by: As directed    Empty and record drain output twice a day   Driving Restrictions   Complete by: As directed    Do not drive while taking pain medications   Driving Restrictions   Complete by:  As directed    Do not drive while taking pain medications   Increase activity slowly   Complete by: As directed    Increase activity slowly   Complete by: As directed    May shower / Bathe   Complete by: As directed       Allergies as of 06/26/2022       Reactions   Lisinopril Other (See Comments)   Angioedema        Medication List     TAKE these medications    amLODipine 10 MG tablet Commonly known as: NORVASC Take 1 tablet (10 mg total) by mouth daily.   aspirin 325 MG tablet Take 1 tablet (325 mg total) by mouth daily.   Brilinta 90 MG Tabs tablet Generic drug: ticagrelor Take 0.5 tablets (45 mg total) by mouth 2 (two) times daily.   brimonidine 0.2 % ophthalmic solution Commonly known as: ALPHAGAN Instill 1 drop into both eyes twice a day   dorzolamide 2 % ophthalmic solution Commonly known as: TRUSOPT Instill 1 drop into both eyes twice a day   feeding supplement Liqd Take 237 mLs by mouth 2 (two) times daily.   latanoprost 0.005 % ophthalmic solution Commonly known as: XALATAN Instill 1 drop into both eyes at bedtime   oxyCODONE 5 MG immediate release tablet Commonly known as: Oxy IR/ROXICODONE Take 1 tablet (5  mg total) by mouth every 6 (six) hours as needed for moderate pain.   triamcinolone cream 0.1 % Commonly known as: KENALOG Apply 1 application topically 2 (two) times daily.               Discharge Care Instructions  (From admission, onward)           Start     Ordered   06/26/22 0000  Discharge wound care:       Comments: Empty and record drain output twice a day   06/26/22 4268            Follow-up Information     Donnie Mesa, MD Follow up in 1 week(s).   Specialty: General Surgery Contact information: [address] [place] [address] [phone]                 Signed: [name] 06/26/2022, 7:25 AM

## 2022-06-26 NOTE — Discharge Instructions (Addendum)
[facility] [place] [phone]  MASTECTOMY: POST OP INSTRUCTIONS  Always review your discharge instruction sheet given to you by the facility where your surgery was performed. IF YOU HAVE DISABILITY OR FAMILY LEAVE FORMS, YOU MUST BRING THEM TO THE OFFICE FOR PROCESSING.   DO NOT GIVE THEM TO YOUR DOCTOR. A prescription for pain medication may be given to you upon discharge.  Take your pain medication as prescribed, if needed.  If narcotic pain medicine is not needed, then you may take acetaminophen (Tylenol) or ibuprofen (Advil) as needed. Take your usually prescribed medications unless otherwise directed. If you need a refill on your pain medication, please contact your pharmacy.  They will contact our office to request authorization.  Prescriptions will not be filled after 5pm or on week-ends. You should follow a light diet the first few days after arrival home, such as soup and crackers, etc.  Resume your normal diet the day after surgery. Most patients will experience some swelling and bruising on the chest and underarm.  Ice packs will help.  Swelling and bruising can take several days to resolve.  It is common to experience some constipation if taking pain medication after surgery.  Increasing fluid intake and taking a stool softener (such as Colace) will usually help or prevent this problem from occurring.  A mild laxative (Milk of Magnesia or Miralax) should be taken according to package instructions if there are no bowel movements after 48 hours. Unless discharge instructions indicate otherwise, leave your bandage dry and in place until your next appointment in 3-5 days.  You may take a limited sponge bath.  No tube baths or showers until the drains are removed.  You may have steri-strips (small skin tapes) in place directly over the incision.  These strips should be left on the skin for 7-10 days.  If your surgeon used skin glue on the incision, you may shower in 24 hours.   The glue will flake off over the next 2-3 weeks.  Any sutures or staples will be removed at the office during your follow-up visit. DRAINS:  If you have drains in place, it is important to keep a list of the amount of drainage produced each day in your drains.  Before leaving the hospital, you should be instructed on drain care.  Call our office if you have any questions about your drains. ACTIVITIES:  You may resume regular (light) daily activities beginning the next day--such as daily self-care, walking, climbing stairs--gradually increasing activities as tolerated.  You may have sexual intercourse when it is comfortable.  Refrain from any heavy lifting or straining until approved by your doctor. You may drive when you are no longer taking prescription pain medication, you can comfortably wear a seatbelt, and you can safely maneuver your car and apply brakes. RETURN TO WORK:  __________________________________________________________ You should see your doctor in the office for a follow-up appointment approximately 3-5 days after your surgery.  Your doctor's nurse will typically make your follow-up appointment when she calls you with your pathology report.  Expect your pathology report 2-3 business days after your surgery.  You may call to check if you do not hear from us after three days.   OTHER INSTRUCTIONS: ______________________________________________________________________________________________ ____________________________________________________________________________________________ WHEN TO CALL YOUR DOCTOR: Fever over 101.0 Nausea and/or vomiting Extreme swelling or bruising Continued bleeding from incision. Increased pain, redness, or drainage from the incision. The clinic staff is available to answer your questions during regular business hours.  Please don't hesitate   to call and ask to speak to one of the nurses for clinical concerns.  If you have a medical emergency, go to the  nearest emergency room or call 911.  A surgeon from [facility] is always on call at the hospital. [address], [place], [place], [place]  [address] ? [address], [place], [place]   [address] [phone] ? [phone] ? FAX [phone] Web site: [email or web address]    About my Jackson-Pratt Bulb Drain  What is a Jackson-Pratt bulb? A Jackson-Pratt is a soft, round device used to collect drainage. It is connected to a long, thin drainage catheter, which is held in place by one or two small stiches near your surgical incision site. When the bulb is squeezed, it forms a vacuum, forcing the drainage to empty into the bulb.  Emptying the Jackson-Pratt bulb- To empty the bulb: 1. Release the plug on the top of the bulb. 2. Pour the bulb's contents into a measuring container which your nurse will provide. 3. Record the time emptied and amount of drainage. Empty the drain(s) as often as your     doctor or nurse recommends.  Date                  Time                    Amount (Drain 1)                 Amount (Drain 2)  _____________________________________________________________________  _____________________________________________________________________  _____________________________________________________________________  _____________________________________________________________________  _____________________________________________________________________  _____________________________________________________________________  _____________________________________________________________________  _____________________________________________________________________  Squeezing the Jackson-Pratt Bulb- To squeeze the bulb: 1. Make sure the plug at the top of the bulb is open. 2. Squeeze the bulb tightly in your fist. You will hear air squeezing from the bulb. 3. Replace the plug while the bulb is squeezed. 4. Use a safety pin to attach the bulb to your  clothing. This will keep the catheter from     pulling at the bulb insertion site.  When to call your doctor- Call your doctor if: Drain site becomes red, swollen or hot. You have a fever greater than 101 degrees F. There is oozing at the drain site. Drain falls out (apply a guaze bandage over the drain hole and secure it with tape). Drainage increases daily not related to activity patterns. (You will usually have more drainage when you are active than when you are resting.) Drainage has a bad odor.    Information for Discharge Teaching: EXPAREL (bupivacaine liposome injectable suspension)   Your surgeon or anesthesiologist gave you EXPAREL(bupivacaine) to help control your pain after surgery.  EXPAREL is a local anesthetic that provides pain relief by numbing the tissue around the surgical site. EXPAREL is designed to release pain medication over time and can control pain for up to 72 hours. Depending on how you respond to EXPAREL, you may require less pain medication during your recovery.  Possible side effects: Temporary loss of sensation or ability to move in the area where bupivacaine was injected. Nausea, vomiting, constipation Rarely, numbness and tingling in your mouth or lips, lightheadedness, or anxiety may occur. Call your doctor right away if you think you may be experiencing any of these sensations, or if you have other questions regarding possible side effects.  Follow all other discharge instructions given to you by your surgeon or nurse. Eat a healthy diet and drink plenty of water or other fluids.  If you  return to the hospital for any reason within 96 hours following the administration of EXPAREL, it is important for health care providers to know that you have received this anesthetic. A teal colored band has been placed on your arm with the date, time and amount of EXPAREL you have received in order to alert and inform your health care providers. Please leave this  armband in place for the full 96 hours following administration, and then you may remove the band.

## 2022-06-28 LAB — SURGICAL PATHOLOGY

## 2022-07-01 ENCOUNTER — Telehealth: Payer: Self-pay | Admitting: *Deleted

## 2022-07-01 ENCOUNTER — Encounter: Payer: Self-pay | Admitting: *Deleted

## 2022-07-01 NOTE — Telephone Encounter (Signed)
Received order for oncotype testing. Requisition faxed to pathology and [facility] °

## 2022-07-02 ENCOUNTER — Encounter: Payer: Self-pay | Admitting: Genetic Counselor

## 2022-07-02 ENCOUNTER — Telehealth: Payer: Self-pay | Admitting: Genetic Counselor

## 2022-07-02 DIAGNOSIS — Z1379 Encounter for other screening for genetic and chromosomal anomalies: Secondary | ICD-10-CM | POA: Insufficient documentation

## 2022-07-02 NOTE — Telephone Encounter (Signed)
Revealed negative BRCAPlus STAT results to daughter [name].  Results of pan-cancer panel pending and will call when they are available.

## 2022-07-05 ENCOUNTER — Ambulatory Visit: Payer: Self-pay | Admitting: Genetic Counselor

## 2022-07-05 DIAGNOSIS — C50311 Malignant neoplasm of lower-inner quadrant of right female breast: Secondary | ICD-10-CM

## 2022-07-05 DIAGNOSIS — Z1379 Encounter for other screening for genetic and chromosomal anomalies: Secondary | ICD-10-CM

## 2022-07-05 NOTE — Progress Notes (Signed)
HPI:   Amy Howell was previously seen in the [place] clinic due to a personal history of breast cancer and concerns regarding a hereditary predisposition to cancer. Please refer to our prior cancer genetics clinic note for more information regarding our discussion, assessment and recommendations, at the time. Amy Howell recent genetic test results were disclosed to her, as were recommendations warranted by these results. These results and recommendations are discussed in more detail below.  CANCER HISTORY:  In July 2023, at the age of 66, Amy Howell was diagnosed with invasive lobular carcinoma of the right breast (ER+/PR+/HER2-) and ductal carcinoma in situ of the right breast.  Oncology History  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, right ([place])  05/09/2022 Mammogram   Screening mammogram showed possible mass in the right breast warranting further evaluation.  Further evaluation showed asymmetry with associated architectural distortion confirmed in the inner right breast and additional focal symmetry confirmed in the lower right breast at 6:00 axis region.  Targeted ultrasound showed an irregular hypoechoic shadowing mass within the right breast at 4:00 Axis II centimeters from the nipple measuring 1.4 x 0.9 x 1.2 cm corresponding to the asymmetry with associated architectural distortion seen on mammogram.  There is also an additional irregular hypoechoic area within the right breast at 6:00, 3 cm from the nipple measuring 2.4 x 0.8 x 1.4 cm.  Right axilla normal   06/03/2022 Pathology Results   Pathology from the lower end at 4 o'clock position right breast mass 2 cm from the nipple showed invasive lobular carcinoma and lobular carcinoma in situ overall grade 2 of 3, prognostics mentioned in the [place] are pertinent for ER 95% strong, PR 10%, Ki-67 of 5% and HER2 negative.  Right breast needle core biopsy at the 6 o'clock position showed DCIS, grade 2,  possible microinvasion and prognostics pending   06/10/2022 Initial Diagnosis   Malignant neoplasm of lower-inner quadrant of left breast in female, estrogen receptor positive ([place])   06/10/2022 Cancer Staging   Staging form: Breast, AJCC 8th Edition - Clinical: Stage IA (cT1c, cN0, cM0, G2, ER+, PR+, HER2-) - Signed by Hayden Pedro, PA-C on 06/10/2022 Method of lymph node assessment: Clinical Histologic grading system: 3 grade system   07/05/2022 Genetic Testing   No pathogenic variants detected in [facility] CancerNext-Expanded +RNAinsight Panel.  Report date is 07/05/2022.   The CancerNext-Expanded gene panel offered by [facility] and includes sequencing, rearrangement, and RNA analysis for the following 77 genes: AIP, ALK, APC, ATM, AXIN2, BAP1, BARD1, BLM, BMPR1A, BRCA1, BRCA2, BRIP1, CDC73, CDH1, CDK4, CDKN1B, CDKN2A, CHEK2, CTNNA1, DICER1, FANCC, FH, FLCN, GALNT12, KIF1B, LZTR1, MAX, MEN1, MET, MLH1, MSH2, MSH3, MSH6, MUTYH, NBN, NF1, NF2, NTHL1, PALB2, PHOX2B, PMS2, POT1, PRKAR1A, PTCH1, PTEN, RAD51C, RAD51D, RB1, RECQL, RET, SDHA, SDHAF2, SDHB, SDHC, SDHD, SMAD4, SMARCA4, SMARCB1, SMARCE1, STK11, SUFU, TMEM127, TP53, TSC1, TSC2, VHL and XRCC2 (sequencing and deletion/duplication); EGFR, EGLN1, HOXB13, KIT, MITF, PDGFRA, POLD1, and POLE (sequencing only); EPCAM and GREM1 (deletion/duplication only).        FAMILY HISTORY:  We obtained a detailed, 4-generation family history.  Significant diagnoses are listed below:      Family History  Problem Relation Age of Onset   Lung cancer Maternal Aunt          dx after 17   Prostate cancer Other          MGM's brother       Amy Howell is unaware of previous  family history of genetic testing for hereditary cancer risks. There is no reported Ashkenazi Jewish ancestry. There is no known consanguinity.    GENETIC TEST RESULTS:  The CancerNext-Expanded +RNAinsight Panel found no pathogenic mutations. The CancerNext-Expanded gene panel  offered by [facility] and includes sequencing, rearrangement, and RNA analysis for the following 77 genes: AIP, ALK, APC, ATM, AXIN2, BAP1, BARD1, BLM, BMPR1A, BRCA1, BRCA2, BRIP1, CDC73, CDH1, CDK4, CDKN1B, CDKN2A, CHEK2, CTNNA1, DICER1, FANCC, FH, FLCN, GALNT12, KIF1B, LZTR1, MAX, MEN1, MET, MLH1, MSH2, MSH3, MSH6, MUTYH, NBN, NF1, NF2, NTHL1, PALB2, PHOX2B, PMS2, POT1, PRKAR1A, PTCH1, PTEN, RAD51C, RAD51D, RB1, RECQL, RET, SDHA, SDHAF2, SDHB, SDHC, SDHD, SMAD4, SMARCA4, SMARCB1, SMARCE1, STK11, SUFU, TMEM127, TP53, TSC1, TSC2, VHL and XRCC2 (sequencing and deletion/duplication); EGFR, EGLN1, HOXB13, KIT, MITF, PDGFRA, POLD1, and POLE (sequencing only); EPCAM and GREM1 (deletion/duplication only).    The test report has been scanned into EPIC and is located under the Molecular Pathology section of the Results Review tab.  A portion of the result report is included below for reference. Genetic testing reported out on July 04, 2022.       Even though a pathogenic variant was not identified, possible explanations for the cancer in the family may include: There may be no hereditary risk for cancer in the family. The cancers in Amy Howell and/or her family may be sporadic/familial or due to other genetic and environmental factors. There may be a gene mutation in one of these genes that current testing methods cannot detect but that chance is small. There could be another gene that has not yet been discovered, or that we have not yet tested, that is responsible for the cancer diagnoses in the family.   Therefore, it is important to remain in touch with cancer genetics in the future so that we can continue to offer Amy Howell the most up to date genetic testing.   ADDITIONAL GENETIC TESTING:  We discussed with Amy Howell that her genetic testing was fairly extensive.  If there are additional relevant genes identified to increase cancer risk that can be analyzed in the future, we would be happy to  discuss and coordinate this testing at that time.      CANCER SCREENING RECOMMENDATIONS:  Amy Howell test result is considered negative (normal).  This means that we have not identified a hereditary cause for her personal history of breast cancer at this time.   An individual's cancer risk and medical management are not determined by genetic test results alone. Overall cancer risk assessment incorporates additional factors, including personal medical history, family history, and any available genetic information that may result in a personalized plan for cancer prevention and surveillance. Therefore, it is recommended she continue to follow the cancer management and screening guidelines provided by her oncology and primary healthcare provider.   RECOMMENDATIONS FOR FAMILY MEMBERS:   Since she did not inherit a identifiable mutation in a cancer predisposition gene included on this panel, her children could not have inherited a known mutation from her in one of these genes. Individuals in this family might be at some increased risk of developing cancer, over the general population risk, due to the family history of cancer.  Individuals in the family should notify their providers of the family history of cancer. We recommend women in this family have a yearly mammogram beginning at age 35, or 16 years younger than the earliest onset of cancer, an annual clinical breast exam, and perform monthly breast self-exams.  FOLLOW-UP:  Lastly, we discussed with Amy Howell that cancer genetics is a rapidly advancing field and it is possible that new genetic tests will be appropriate for her and/or her family members in the future. We encouraged her to remain in contact with cancer genetics on an annual basis so we can update her personal and family histories and let her know of advances in cancer genetics that may benefit this family.   Our contact number was provided. Amy Howell questions were answered to  her satisfaction, and she knows she is welcome to call us at anytime with additional questions or concerns.   Goble Fudala M. Joette Catching, [place], [facility] Genetic Counselor [email or web address] (P) [phone]

## 2022-07-05 NOTE — Telephone Encounter (Signed)
Revealed negative pan-cancer panel to daughter, [name].  Discussed that we do not know why Ms. Whack has breast cancer.  Likely sporadic/familial. It could be due to a different gene that we are not testing or maybe our current technology may not be able to pick something up.  It will be important for her to keep in contact with genetics to keep up with whether additional testing may be needed.

## 2022-07-08 ENCOUNTER — Encounter: Payer: Self-pay | Admitting: Hematology and Oncology

## 2022-07-08 NOTE — Progress Notes (Unsigned)
Received records request from Oncotype sent last office note and path report to fax number [phone], confirmation received.

## 2022-07-09 ENCOUNTER — Telehealth: Payer: Self-pay | Admitting: *Deleted

## 2022-07-09 ENCOUNTER — Inpatient Hospital Stay: Payer: Medicare (Managed Care) | Attending: Hematology and Oncology | Admitting: Hematology and Oncology

## 2022-07-09 ENCOUNTER — Other Ambulatory Visit: Payer: Self-pay

## 2022-07-09 ENCOUNTER — Encounter: Payer: Self-pay | Admitting: Hematology and Oncology

## 2022-07-09 DIAGNOSIS — Z79811 Long term (current) use of aromatase inhibitors: Secondary | ICD-10-CM | POA: Diagnosis not present

## 2022-07-09 DIAGNOSIS — Z170 Estrogen receptor positive status [ER+]: Secondary | ICD-10-CM | POA: Diagnosis not present

## 2022-07-09 DIAGNOSIS — F17210 Nicotine dependence, cigarettes, uncomplicated: Secondary | ICD-10-CM | POA: Insufficient documentation

## 2022-07-09 DIAGNOSIS — C50311 Malignant neoplasm of lower-inner quadrant of right female breast: Secondary | ICD-10-CM

## 2022-07-09 DIAGNOSIS — C50312 Malignant neoplasm of lower-inner quadrant of left female breast: Secondary | ICD-10-CM | POA: Diagnosis not present

## 2022-07-09 NOTE — Telephone Encounter (Signed)
error 

## 2022-07-09 NOTE — Assessment & Plan Note (Signed)
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  She is status post right mastectomy.  Final pathology showed invasive lobular carcinoma, grade 2 measuring about 2.5 x 1.7 x 1 cm along with some DCIS, negative margins, 3 out of 3 lymph nodes without any involvement.  Prior prognostic showed ER 95% strong staining PR 10% strong staining, KI of 5% and HER2 negative, 0 by IHC. We have sent her surgical specimen for Oncotype testing, awaiting results.  We have also discussed about antiestrogen therapy.  Given her postmenopausal status, we have discussed about mechanism of action of antiestrogen therapy, adverse effects of antiestrogen therapy including but not limited to postmenopausal symptoms such as hot flashes, vaginal dryness, bone loss, arthralgias.  We will do a televisit in 2 weeks to review Oncotype results.  We will start her on anastrozole after this.   She expressed understanding of all the recommendations.  She will return to clinic as scheduled.

## 2022-07-09 NOTE — Progress Notes (Signed)
[place] NOTE  Patient Care Team: Ladell Pier, MD as PCP - General (Internal Medicine) Donnie Mesa, MD as Consulting Physician (General Surgery) Benay Pike, MD as Consulting Physician (Hematology and Oncology) Kyung Rudd, MD as Consulting Physician (Radiation Oncology) Mauro Kaufmann, RN as Oncology Nurse Navigator Rockwell Germany, RN as Oncology Nurse Navigator  CHIEF COMPLAINTS/PURPOSE OF CONSULTATION:  Newly diagnosed breast cancer  HISTORY OF PRESENTING ILLNESS:  Amy Howell 66 y.o. female is here because of recent diagnosis of right [place]  I reviewed her records extensively and collaborated the history with the patient.  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  Further evaluation showed asymmetry with associated architectural distortion confirmed in the inner right breast and additional focal symmetry confirmed in the lower right breast at 6:00 axis region.  Targeted ultrasound showed an irregular hypoechoic shadowing mass within the right breast at 4:00 Axis II centimeters from the nipple measuring 1.4 x 0.9 x 1.2 cm corresponding to the asymmetry with associated architectural distortion seen on mammogram.  There is also an additional irregular hypoechoic area within the right breast at 6:00, 3 cm from the nipple measuring 2.4 x 0.8 x 1.4 cm.  Right axilla normal   06/03/2022 Pathology Results   Pathology from the lower end at 4 o'clock position right breast mass 2 cm from the nipple showed invasive lobular carcinoma and lobular carcinoma in situ overall grade 2 of 3, prognostics mentioned in the [place] are pertinent for ER 95% strong, PR 10%, Ki-67 of 5% and HER2 negative.  Right breast needle core biopsy at the 6 o'clock position showed DCIS,  grade 2, possible microinvasion and prognostics pending   06/10/2022 Initial Diagnosis   Malignant neoplasm of lower-inner quadrant of left breast in female, estrogen receptor positive ([place])   06/10/2022 Cancer Staging   Staging form: Breast, AJCC 8th Edition - Clinical: Stage IA (cT1c, cN0, cM0, G2, ER+, PR+, HER2-) - Signed by Hayden Pedro, PA-C on 06/10/2022 Method of lymph node assessment: Clinical Histologic grading system: 3 grade system   06/25/2022 Definitive Surgery   She had right breast mastectomy which showed grade 1 invasive lobular carcinoma, 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  Lobular carcinoma measures 2.5 x 1.7 x 1 cm, DCIS measures 2.5 x 1.7 x 1.5 cm, margins negative, all lymph nodes benign.  Prior prognostics ER 95% strong staining, PR 10% strong staining, HER2 -0, Ki-67 of 5%   07/05/2022 Genetic Testing   No pathogenic variants detected in [facility] CancerNext-Expanded +RNAinsight Panel.  Report date is 07/05/2022.   The CancerNext-Expanded gene panel offered by [facility] and includes sequencing, rearrangement, and RNA analysis for the following 77 genes: AIP, ALK, APC, ATM, AXIN2, BAP1, BARD1, BLM, BMPR1A, BRCA1, BRCA2, BRIP1, CDC73, CDH1, CDK4, CDKN1B, CDKN2A, CHEK2, CTNNA1, DICER1, FANCC, FH, FLCN, GALNT12, KIF1B, LZTR1, MAX, MEN1, MET, MLH1, MSH2, MSH3, MSH6, MUTYH, NBN, NF1, NF2, NTHL1, PALB2, PHOX2B, PMS2, POT1, PRKAR1A, PTCH1, PTEN, RAD51C, RAD51D, RB1, RECQL, RET, SDHA, SDHAF2, SDHB, SDHC, SDHD, SMAD4, SMARCA4, SMARCB1, SMARCE1, STK11, SUFU, TMEM127, TP53, TSC1, TSC2, VHL and XRCC2 (sequencing and deletion/duplication); EGFR, EGLN1, HOXB13, KIT, MITF, PDGFRA, POLD1, and POLE (sequencing only); EPCAM and GREM1 (deletion/duplication only).     Patient is here for follow-up after her recent right mastectomy.  She is doing quite well, drain has been removed.  Steri-Strips in place.  She denies any  unexpected pain. Rest of the pertinent 10 point ROS reviewed and negative  MEDICAL HISTORY:  Past Medical History:  Diagnosis Date   Cataracts, bilateral    Cerebral infarction due to carotid artery stenosis (HCC)    Glaucoma    Hypertension    Stroke ([place])    no residual, "series of mini strokes"   Vertigo     SURGICAL HISTORY: Past Surgical History:  Procedure Laterality Date   ABDOMINAL HYSTERECTOMY     CARDIAC DEFIBRILLATOR PLACEMENT     gum operation     Implantable loop recorder removal  02/01/2019   MDT Reveal LINQ explantation in office by JA   IR 3D INDEPENDENT WKST  05/07/2017   IR ANGIO INTRA EXTRACRAN SEL COM CAROTID INNOMINATE BILAT MOD SED  04/23/2017   IR ANGIO INTRA EXTRACRAN SEL COM CAROTID INNOMINATE BILAT MOD SED  08/06/2017   IR ANGIO INTRA EXTRACRAN SEL COM CAROTID INNOMINATE UNI R MOD SED  05/07/2017   IR ANGIO VERTEBRAL SEL VERTEBRAL BILAT MOD SED  04/23/2017   IR ANGIO VERTEBRAL SEL VERTEBRAL BILAT MOD SED  08/06/2017   IR ANGIOGRAM FOLLOW UP STUDY  05/07/2017   IR ANGIOGRAM SELECTIVE EACH ADDITIONAL VESSEL  05/07/2017   IR RADIOLOGIST EVAL & MGMT  04/16/2017   IR RADIOLOGIST EVAL & MGMT  05/29/2017   IR TRANSCATH/EMBOLIZ  05/07/2017   LOOP RECORDER INSERTION N/A 03/28/2017   Procedure: Loop Recorder Insertion;  Surgeon: Thompson Grayer, MD;  Location: [place] [facility];  Service: Cardiovascular;  Laterality: N/A;   MASTECTOMY W/ SENTINEL NODE BIOPSY Right 06/25/2022   Procedure: RIGHT MASTECTOMY WITH AXILLARY SENTINEL LYMPH NODE BIOPSY;  Surgeon: Donnie Mesa, MD;  Location: [place];  Service: General;  Laterality: Right;   RADIOLOGY WITH ANESTHESIA N/A 05/07/2017   Procedure: EMBOLIZATION;  Surgeon: Luanne Bras, MD;  Location: [place];  Service: Radiology;  Laterality: N/A;   RADIOLOGY WITH ANESTHESIA N/A 08/06/2017   Procedure: RADIOLOGY WITH ANESTHESIA STENTING;  Surgeon: Luanne Bras, MD;  Location: [place];  Service: Radiology;  Laterality:  N/A;   TEE WITHOUT CARDIOVERSION N/A 03/28/2017   Procedure: TRANSESOPHAGEAL ECHOCARDIOGRAM (TEE);  Surgeon: Lelon Perla, MD;  Location[facility];  Service: Cardiovascular;  Laterality: N/A;    SOCIAL HISTORY: Social History   Socioeconomic History   Marital status: Single    Spouse name: Not on file   Number of children: Not on file   Years of education: Not on file   Highest education level: Not on file  Occupational History   Not on file  Tobacco Use   Smoking status: Every Day    Packs/day: 0.25    Types: Cigarettes   Smokeless tobacco: Never  Vaping Use   Vaping Use: Never used  Substance and Sexual Activity   Alcohol use: No   Drug use: No   Sexual activity: Not Currently    Birth control/protection: Surgical    Comment: Hyst  Other Topics Concern   Not on file  Social History Narrative   Not on file   Social Determinants of Health   Financial Resource Strain: Low Risk  (06/12/2022)   Overall Financial Resource Strain (CARDIA)    Difficulty of Paying Living Expenses: Not very hard  Food Insecurity: No Food Insecurity (06/12/2022)   Hunger Vital Sign    Worried About Running Out of Food in the Last Year:  Never true    Ran Out of Food in the Last Year: Never true  Transportation Needs: No Transportation Needs (06/12/2022)   PRAPARE - Hydrologist (Medical): No    Lack of Transportation (Non-Medical): No  Physical Activity: Not on file  Stress: Not on file  Social Connections: Not on file  Intimate Partner Violence: Not on file    FAMILY HISTORY: Family History  Problem Relation Age of Onset   Hypertension Father    Diabetes Father    Lung cancer Maternal Aunt        dx after 46   Prostate cancer Other        MGM's brother   Breast cancer Neg Hx     ALLERGIES:  is allergic to lisinopril.  MEDICATIONS:  Current Outpatient Medications  Medication Sig Dispense Refill   amLODipine (NORVASC) 10 MG tablet Take 1  tablet (10 mg total) by mouth daily. 90 tablet 1   aspirin 325 MG tablet Take 1 tablet (325 mg total) by mouth daily. 90 tablet 3   brimonidine (ALPHAGAN) 0.2 % ophthalmic solution Instill 1 drop into both eyes twice a day 15 mL 11   dorzolamide (TRUSOPT) 2 % ophthalmic solution Instill 1 drop into both eyes twice a day 30 mL 3   feeding supplement, ENSURE ENLIVE, (ENSURE ENLIVE) LIQD Take 237 mLs by mouth 2 (two) times daily.     latanoprost (XALATAN) 0.005 % ophthalmic solution Instill 1 drop into both eyes at bedtime 22.5 mL 3   oxyCODONE (OXY IR/ROXICODONE) 5 MG immediate release tablet Take 1 tablet (5 mg total) by mouth every 6 (six) hours as needed for moderate pain. 20 tablet 0   ticagrelor (BRILINTA) 90 MG TABS tablet Take 0.5 tablets (45 mg total) by mouth 2 (two) times daily. 30 tablet 5   triamcinolone cream (KENALOG) 0.1 % Apply 1 application topically 2 (two) times daily. 30 g 0   No current facility-administered medications for this visit.    REVIEW OF SYSTEMS:   Constitutional: Denies fevers, chills or abnormal night sweats Eyes: Denies blurriness of vision, double vision or watery eyes Ears, nose, mouth, throat, and face: Denies mucositis or sore throat Respiratory: Denies cough, dyspnea or wheezes Cardiovascular: Denies palpitation, chest discomfort or lower extremity swelling Gastrointestinal:  Denies nausea, heartburn or change in bowel habits Skin: Denies abnormal skin rashes Lymphatics: Denies new lymphadenopathy or easy bruising Neurological:Denies numbness, tingling or new weaknesses Behavioral/Psych: Mood is stable, no new changes  Breast: Palpated a lump in the right breast about 3 months ago All other systems were reviewed with the patient and are negative.  PHYSICAL EXAMINATION: ECOG PERFORMANCE STATUS: 0 - Asymptomatic  Vitals:   07/09/22 1149  BP: (!) 206/80  Pulse: 87  Resp: 18  Temp: 98.1 F (36.7 C)  SpO2: 100%    Filed Weights   07/09/22 1149   Weight: 102 lb 6.4 oz (46.4 kg)     GENERAL:alert, no distress and comfortable Right breast Steri-Strips in place.  Right mastectomy site appears to be healing well.  LABORATORY DATA:  I have reviewed the data as listed Lab Results  Component Value Date   WBC 6.4 06/12/2022   HGB 12.1 06/12/2022   HCT 37.8 06/12/2022   MCV 93.6 06/12/2022   PLT 169 06/12/2022   Lab Results  Component Value Date   NA 139 06/12/2022   K 4.3 06/12/2022   CL 107 06/12/2022   CO2 25 06/12/2022  RADIOGRAPHIC STUDIES: I have personally reviewed the radiological reports and agreed with the findings in the report.  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  She is status post right mastectomy.  Final pathology showed invasive lobular carcinoma, grade 2 measuring about 2.5 x 1.7 x 1 cm along with some DCIS, negative margins, 3 out of 3 lymph nodes without any involvement.  Prior prognostic showed ER 95% strong staining PR 10% strong staining, KI of 5% and HER2 negative, 0 by IHC. We have sent her surgical specimen for Oncotype testing, awaiting results.  We have also discussed about antiestrogen therapy.  Given her postmenopausal status, we have discussed about mechanism of action of antiestrogen therapy, adverse effects of antiestrogen therapy including but not limited to postmenopausal symptoms such as hot flashes, vaginal dryness, bone loss, arthralgias.  We will do a televisit in 2 weeks to review Oncotype results.  We will start her on anastrozole after this.   She expressed understanding of all the  recommendations.  She will return to clinic as scheduled.  I spent 30 minutes in the care of this patient including history, physical exam, review of records, counseling and coordination All questions were answered. The patient knows to call the clinic with any problems, questions or concerns.    Benay Pike, MD 07/09/22

## 2022-07-10 ENCOUNTER — Telehealth: Payer: Self-pay | Admitting: Hematology and Oncology

## 2022-07-10 ENCOUNTER — Other Ambulatory Visit (HOSPITAL_COMMUNITY): Payer: Self-pay

## 2022-07-10 MED ORDER — TIMOLOL MALEATE 0.5 % OP SOLN
1.0000 [drp] | Freq: Every morning | OPHTHALMIC | 3 refills | Status: DC
  Filled 2022-07-10: qty 5, 50d supply, fill #0
  Filled 2022-10-28: qty 5, 50d supply, fill #1

## 2022-07-10 NOTE — Telephone Encounter (Signed)
Contacted patient to scheduled appointments. Left message with appointment details and a call back number if patient had any questions or could not accommodate the time we provided.   

## 2022-07-11 ENCOUNTER — Encounter (HOSPITAL_COMMUNITY): Payer: Self-pay

## 2022-07-11 ENCOUNTER — Other Ambulatory Visit (HOSPITAL_COMMUNITY): Payer: Self-pay

## 2022-07-15 ENCOUNTER — Encounter: Payer: Self-pay | Admitting: *Deleted

## 2022-07-15 ENCOUNTER — Telehealth: Payer: Self-pay | Admitting: *Deleted

## 2022-07-15 NOTE — Telephone Encounter (Signed)
Received oncotype results of 10/3%. Left VM for patient to return my call.

## 2022-07-16 ENCOUNTER — Other Ambulatory Visit (HOSPITAL_COMMUNITY): Payer: Self-pay

## 2022-07-24 ENCOUNTER — Other Ambulatory Visit (HOSPITAL_COMMUNITY): Payer: Self-pay

## 2022-07-24 ENCOUNTER — Ambulatory Visit: Payer: Medicare (Managed Care) | Attending: Surgery | Admitting: Rehabilitation

## 2022-07-24 ENCOUNTER — Encounter: Payer: Self-pay | Admitting: *Deleted

## 2022-07-24 ENCOUNTER — Inpatient Hospital Stay (HOSPITAL_BASED_OUTPATIENT_CLINIC_OR_DEPARTMENT_OTHER): Payer: Medicare (Managed Care) | Admitting: Hematology and Oncology

## 2022-07-24 ENCOUNTER — Encounter: Payer: Self-pay | Admitting: Hematology and Oncology

## 2022-07-24 DIAGNOSIS — C50311 Malignant neoplasm of lower-inner quadrant of right female breast: Secondary | ICD-10-CM

## 2022-07-24 DIAGNOSIS — Z9189 Other specified personal risk factors, not elsewhere classified: Secondary | ICD-10-CM | POA: Insufficient documentation

## 2022-07-24 DIAGNOSIS — Z483 Aftercare following surgery for neoplasm: Secondary | ICD-10-CM | POA: Diagnosis not present

## 2022-07-24 MED ORDER — ANASTROZOLE 1 MG PO TABS
1.0000 mg | ORAL_TABLET | Freq: Every day | ORAL | 3 refills | Status: DC
  Filled 2022-07-24: qty 90, 90d supply, fill #0

## 2022-07-24 NOTE — Patient Instructions (Signed)
     [facility]  [address], [address]  [place] [address]  [phone]  After Breast Cancer Class It is recommended you attend the ABC class to be educated on lymphedema risk reduction. This class is free of charge and lasts for 1 hour. It is a 1-time class. You will need to download the Webex app either on your phone or computer. We will send you a link the night before or the morning of the class. You should be able to click on that link to join the class. This is not a confidential class. You don't have to turn your camera on, but other participants may be able to see your email address.  Scar massage You can begin gentle scar massage to you incision sites. Gently place one hand on the incision and move the skin (without sliding on the skin) in various directions. Do this for a few minutes and then you can gently massage either coconut oil or vitamin E cream into the scars.  Compression garment You should continue wearing your compression bra until you feel like you no longer have swelling.  Home exercise Program Continue doing the exercises you were given until you feel like you can do them without feeling any tightness at the end.   Walking Program Studies show that 30 minutes of walking per day (fast enough to elevate your heart rate) can significantly reduce the risk of a cancer recurrence. If you can't walk due to other medical reasons, we encourage you to find another activity you could do (like a stationary bike or water exercise).  Posture After breast cancer surgery, people frequently sit with rounded shoulders posture because it puts their incisions on slack and feels better. If you sit like this and scar tissue forms in that position, you can become very tight and have pain sitting or standing with good posture. Try to be aware of your posture and sit and stand up tall to heal properly.  Follow up PT: It is recommended you return every 3 months for  the first 2 years following surgery to be assessed on the SOZO machine for an L-Dex score. This helps prevent clinically significant lymphedema in 95% of patients. These follow up screens are 10 minute appointments that you are not billed for.  

## 2022-07-24 NOTE — Assessment & Plan Note (Addendum)
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  She is status post right mastectomy.  Final pathology showed invasive lobular carcinoma, grade 2 measuring about 2.5 x 1.7 x 1 cm along with some DCIS, negative margins, 3 out of 3 lymph nodes without any involvement.  Prior prognostic showed ER 95% strong staining PR 10% strong staining, KI of 5% and HER2 negative, 0 by IHC.  Oncotype of 10, no benefit from adjuvant chemotherapy.  We have however discussed the importance of antiestrogen therapy.  She is reluctant to try tamoxifen given the risk of blood clots.  We have then discussed about aromatase inhibitors, adverse effects and benefits.  She understands that the benefits overweight the risks.  She is agreeable to trying anastrozole.  She will return to clinic in 3 months for survivorship visit and to review antiestrogen therapy toxicity. She expressed understanding of all the recommendations.  She will return to clinic as scheduled.

## 2022-07-24 NOTE — Therapy (Addendum)
 OUTPATIENT PHYSICAL THERAPY BREAST CANCER POST OP FOLLOW UP   Patient Name: Amy Howell MRN: [id number] DOB:1956-02-08, 66 y.o., female Today's Date: 07/24/2022   PT End of Session - 07/24/22 1558     Visit Number 2    Number of Visits 2    Date for PT Re-Evaluation 07/19/22    PT Start Time 1602    PT Stop Time 1632    PT Time Calculation (min) 30 min    Activity Tolerance Patient tolerated treatment well    Behavior During Therapy WFL for tasks assessed/performed             Past Medical History:  Diagnosis Date   Cataracts, bilateral    Cerebral infarction due to carotid artery stenosis (HCC)    Glaucoma    Hypertension    Stroke (HCC)    no residual, "series of mini strokes"   Vertigo    Past Surgical History:  Procedure Laterality Date   ABDOMINAL HYSTERECTOMY     CARDIAC DEFIBRILLATOR PLACEMENT     gum operation     Implantable loop recorder removal  02/01/2019   MDT Reveal LINQ explantation in office by JA   IR 3D INDEPENDENT WKST  05/07/2017   IR ANGIO INTRA EXTRACRAN SEL COM CAROTID INNOMINATE BILAT MOD SED  04/23/2017   IR ANGIO INTRA EXTRACRAN SEL COM CAROTID INNOMINATE BILAT MOD SED  08/06/2017   IR ANGIO INTRA EXTRACRAN SEL COM CAROTID INNOMINATE UNI R MOD SED  05/07/2017   IR ANGIO VERTEBRAL SEL VERTEBRAL BILAT MOD SED  04/23/2017   IR ANGIO VERTEBRAL SEL VERTEBRAL BILAT MOD SED  08/06/2017   IR ANGIOGRAM FOLLOW UP STUDY  05/07/2017   IR ANGIOGRAM SELECTIVE EACH ADDITIONAL VESSEL  05/07/2017   IR RADIOLOGIST EVAL & MGMT  04/16/2017   IR RADIOLOGIST EVAL & MGMT  05/29/2017   IR TRANSCATH/EMBOLIZ  05/07/2017   LOOP RECORDER INSERTION N/A 03/28/2017   Procedure: Loop Recorder Insertion;  Surgeon: Hillis Range, MD;  [facility];  Service: Cardiovascular;  Laterality: N/A;   MASTECTOMY W/ SENTINEL NODE BIOPSY Right 06/25/2022   Procedure: RIGHT MASTECTOMY WITH AXILLARY SENTINEL LYMPH NODE BIOPSY;  Surgeon: Manus Rudd, MD;  Location: [facility];  Service: General;  Laterality: Right;   RADIOLOGY WITH ANESTHESIA N/A 05/07/2017   Procedure: EMBOLIZATION;  Surgeon: Julieanne Cotton, MD;  Location: [facility];  Service: Radiology;  Laterality: N/A;   RADIOLOGY WITH ANESTHESIA N/A 08/06/2017   Procedure: RADIOLOGY WITH ANESTHESIA STENTING;  Surgeon: Julieanne Cotton, MD;  Location: [facility];  Service: Radiology;  Laterality: N/A;   TEE WITHOUT CARDIOVERSION N/A 03/28/2017   Procedure: TRANSESOPHAGEAL ECHOCARDIOGRAM (TEE);  Surgeon: Lewayne Bunting, MD;  Location: [facility];  Service: Cardiovascular;  Laterality: N/A;   Patient Active Problem List   Diagnosis Date Noted   Genetic testing 07/02/2022   Invasive lobular carcinoma of breast, stage 1, right (HCC) 06/25/2022   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, right (HCC) 06/10/2022   Statin declined 12/06/2021   Colon cancer screening declined 07/15/2019   Mammogram declined 07/15/2019   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) 01/08/2019   Tobacco dependence 03/30/2018   Glaucoma of both eyes 03/30/2018   History of loop recorder 03/30/2018   Prediabetes 04/10/2017   Cryptogenic stroke (HCC)    Aneurysm of middle cerebral artery    Weight loss, unintentional    Basilar artery stenosis    Essential hypertension 03/26/2017  PCP: Jonah Blue MD   REFERRING PROVIDER: Manus Rudd, MD   REFERRING DIAG: Rt breast cancer   THERAPY DIAG:  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  I am using an  ace bandage.  Not feeling swollen.  I lay on my pillow at night and have no pain.  I don't need a bra.    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 3 negative lymph nodes. No chemo or radiation needed.    PATIENT GOALS:  Reassess how my recovery is going related to arm function, pain, and swelling.  PAIN:  Are you having pain? no  PRECAUTIONS: Recent Surgery, right UE Lymphedema risk  ACTIVITY LEVEL / LEISURE: I am still trying not to do too much.     OBJECTIVE:   PATIENT SURVEYS:  QUICK DASH: Did not fill out  OBSERVATIONS:  Healing well - wearing ace wrap for support - no edema noted - steristrips present  POSTURE:  Rounded shoulders   LYMPHEDEMA ASSESSMENT:  UPPER EXTREMITY AROM/PROM:   A/PROM RIGHT   eval   07/24/22  Shoulder extension 60 62  Shoulder flexion 153 113 - feels more like the tape is pulling  Shoulder abduction 168 120 - feels like the tape is pulling   Shoulder internal rotation     Shoulder external rotation 90                           (Blank rows = not tested)    LYMPHEDEMA ASSESSMENTS:    LANDMARK RIGHT   eval 07/24/22  10 cm proximal to olecranon process 23.3 23.0  Olecranon process 21 21  10  cm proximal to ulnar styloid process 16.3 17  Just proximal to ulnar styloid process 13.5 13  Across hand at thumb web space 17.3 17.3  At base of 2nd digit 5.5 5.5  (Blank rows = not tested)   LANDMARK LEFT   eval 07/24/22  10 cm proximal to olecranon process 23.3 23  Olecranon process 21 21  10  cm proximal to ulnar styloid process 17 17  Just proximal to ulnar styloid process 13.2 13.2  Across hand at thumb web space 16.5 16.5  At base of 2nd digit 5.5 5.5  (Blank rows = not tested)   PATIENT EDUCATION:  Education details: per below with addition of risk reduction and ABC class handout.  Person educated: Patient Education method: Explanation, Demonstration, Verbal cues, and  Handouts Education comprehension: verbalized understanding and returned demonstration   HOME EXERCISE PROGRAM:  Reviewed previously given post op HEP with performance by pt  ASSESSMENT:  CLINICAL IMPRESSION: Pt is 4 weeks post Rt mastectomy and is doing well.  She reports her motion just feels limited by the steristrips and felt like she got enough movement in her everyday activities that she didn't really need to stretch.  Pt was encouraged to stretch and reviewed these in clinic demonstrating how they would target more end range.  Pt was verbally educated on lymphedema and given paper handouts for ABC class information.  Pt was encouraged to return if she feels any trouble getting her ROM back after the steristrips come off.    Pt will benefit from skilled therapeutic intervention to improve on the following deficits: Decreased knowledge of precautions, impaired UE functional use, pain, decreased ROM, postural dysfunction.   PT treatment/interventions: ADL/Self care home management, Therapeutic exercises, Therapeutic activity, Neuromuscular re-education, Balance training, Gait training, Patient/Family education, Self Care, and Joint mobilization     GOALS: Goals reviewed with patient? Yes  LONG TERM GOALS:  (STG=LTG)  GOALS Name Target Date  Goal status  1 Pt will demonstrate she has regained full shoulder ROM and function post operatively compared to baselines.  Baseline: 07/24/22 MET                    PLAN: PT FREQUENCY/DURATION: Pt will only return as needed and for SOZO  PLAN FOR NEXT SESSION: [facility]  [address]  [place] [address]  [phone]  After Breast Cancer Class It is recommended you attend the ABC class to be educated on lymphedema risk reduction. This class is free of charge and lasts for 1 hour. It is a 1-time class. You will need to download the Webex app either on your phone or computer. We will send  you a link the night before or the morning of the class. You should be able to click on that link to join the class. This is not a confidential class. You don't have to turn your camera on, but other participants may be able to see your email address.  Scar massage You can begin gentle scar massage to you incision sites. Gently place one hand on the incision and move the skin (without sliding on the skin) in various directions. Do this for a few minutes and then you can gently massage either coconut oil or vitamin E cream into the scars.  Compression garment You should continue wearing your compression bra until you feel like you no longer have swelling.  Home exercise Program Continue doing the exercises you were given until you feel like you can do them without feeling any tightness at the end.   Walking Program Studies show that 30 minutes of walking per day (fast enough to elevate your heart rate) can significantly reduce the risk of a cancer recurrence. If you can't walk due to other medical reasons, we encourage you to find another activity you could do (like a stationary bike or water exercise).  Posture After  breast cancer surgery, people frequently sit with rounded shoulders posture because it puts their incisions on slack and feels better. If you sit like this and scar tissue forms in that position, you can become very tight and have pain sitting or standing with good posture. Try to be aware of your posture and sit and stand up tall to heal properly.  Follow up PT: It is recommended you return every 3 months for the first 3 years following surgery to be assessed on the SOZO machine for an L-Dex score. This helps prevent clinically significant lymphedema in 95% of patients. These follow up screens are 10 minute appointments that you are not billed for.  Idamae Lusher, PT 07/24/2022, 4:43 PM  PHYSICAL THERAPY DISCHARGE SUMMARY  Visits from Start of Care: 2  Current functional level  related to goals / functional outcomes: Per above   Remaining deficits: lymphedema risk    Education / Equipment: Self care HEP   Plan: Patient agrees to discharge.  Patient goals were partially met.

## 2022-07-24 NOTE — Progress Notes (Signed)
[facility] NOTE  Patient Care Team: Ladell Pier, MD as PCP - General (Internal Medicine) Donnie Mesa, MD as Consulting Physician (General Surgery) Benay Pike, MD as Consulting Physician (Hematology and Oncology) Kyung Rudd, MD as Consulting Physician (Radiation Oncology) Mauro Kaufmann, RN as Oncology Nurse Navigator Rockwell Germany, RN as Oncology Nurse Navigator  CHIEF COMPLAINTS/PURPOSE OF CONSULTATION:  Breast cancer follow up.  HISTORY OF PRESENTING ILLNESS:  Amy Howell 66 y.o. female is here because of recent diagnosis of right [place]  I reviewed her records extensively and collaborated the history with the patient.  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  Further evaluation showed asymmetry with associated architectural distortion confirmed in the inner right breast and additional focal symmetry confirmed in the lower right breast at 6:00 axis region.  Targeted ultrasound showed an irregular hypoechoic shadowing mass within the right breast at 4:00 Axis II centimeters from the nipple measuring 1.4 x 0.9 x 1.2 cm corresponding to the asymmetry with associated architectural distortion seen on mammogram.  There is also an additional irregular hypoechoic area within the right breast at 6:00, 3 cm from the nipple measuring 2.4 x 0.8 x 1.4 cm.  Right axilla normal   06/03/2022 Pathology Results   Pathology from the lower end at 4 o'clock position right breast mass 2 cm from the nipple showed invasive lobular carcinoma and lobular carcinoma in situ overall grade 2 of 3, prognostics mentioned in the [place] are pertinent for ER 95% strong, PR 10%, Ki-67 of 5% and HER2 negative.  Right breast needle core biopsy at the 6 o'clock position showed DCIS, grade 2,  possible microinvasion and prognostics pending   06/10/2022 Initial Diagnosis   Malignant neoplasm of lower-inner quadrant of left breast in female, estrogen receptor positive ([place])   06/10/2022 Cancer Staging   Staging form: Breast, AJCC 8th Edition - Clinical: Stage IA (cT1c, cN0, cM0, G2, ER+, PR+, HER2-) - Signed by Hayden Pedro, PA-C on 06/10/2022 Method of lymph node assessment: Clinical Histologic grading system: 3 grade system   06/25/2022 Definitive Surgery   She had right breast mastectomy which showed grade 1 invasive lobular carcinoma, 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  Lobular carcinoma measures 2.5 x 1.7 x 1 cm, DCIS measures 2.5 x 1.7 x 1.5 cm, margins negative, all lymph nodes benign.  Prior prognostics ER 95% strong staining, PR 10% strong staining, HER2 -0, Ki-67 of 5%   07/05/2022 Genetic Testing   No pathogenic variants detected in [facility] CancerNext-Expanded +RNAinsight Panel.  Report date is 07/05/2022.   The CancerNext-Expanded gene panel offered by [facility] and includes sequencing, rearrangement, and RNA analysis for the following 77 genes: AIP, ALK, APC, ATM, AXIN2, BAP1, BARD1, BLM, BMPR1A, BRCA1, BRCA2, BRIP1, CDC73, CDH1, CDK4, CDKN1B, CDKN2A, CHEK2, CTNNA1, DICER1, FANCC, FH, FLCN, GALNT12, KIF1B, LZTR1, MAX, MEN1, MET, MLH1, MSH2, MSH3, MSH6, MUTYH, NBN, NF1, NF2, NTHL1, PALB2, PHOX2B, PMS2, POT1, PRKAR1A, PTCH1, PTEN, RAD51C, RAD51D, RB1, RECQL, RET, SDHA, SDHAF2, SDHB, SDHC, SDHD, SMAD4, SMARCA4, SMARCB1, SMARCE1, STK11, SUFU, TMEM127, TP53, TSC1, TSC2, VHL and XRCC2 (sequencing and deletion/duplication); EGFR, EGLN1, HOXB13, KIT, MITF, PDGFRA, POLD1, and POLE (sequencing only); EPCAM and GREM1 (deletion/duplication only).    07/09/2022 Oncotype testing   Oncotype score of 10, distant recurrence risk  at 9 years of 3%, no benefit of adjuvant chemotherapy    She is here for telephone follow-up.   She denies any new complaints.  She was with her daughter when I called her.  She has been recovering well from the surgery, has physical therapy appointment later today. Rest of the pertinent 10 point ROS reviewed and negative  MEDICAL HISTORY:  Past Medical History:  Diagnosis Date   Cataracts, bilateral    Cerebral infarction due to carotid artery stenosis (HCC)    Glaucoma    Hypertension    Stroke ([place])    no residual, "series of mini strokes"   Vertigo     SURGICAL HISTORY: Past Surgical History:  Procedure Laterality Date   ABDOMINAL HYSTERECTOMY     CARDIAC DEFIBRILLATOR PLACEMENT     gum operation     Implantable loop recorder removal  02/01/2019   MDT Reveal LINQ explantation in office by JA   IR 3D INDEPENDENT WKST  05/07/2017   IR ANGIO INTRA EXTRACRAN SEL COM CAROTID INNOMINATE BILAT MOD SED  04/23/2017   IR ANGIO INTRA EXTRACRAN SEL COM CAROTID INNOMINATE BILAT MOD SED  08/06/2017   IR ANGIO INTRA EXTRACRAN SEL COM CAROTID INNOMINATE UNI R MOD SED  05/07/2017   IR ANGIO VERTEBRAL SEL VERTEBRAL BILAT MOD SED  04/23/2017   IR ANGIO VERTEBRAL SEL VERTEBRAL BILAT MOD SED  08/06/2017   IR ANGIOGRAM FOLLOW UP STUDY  05/07/2017   IR ANGIOGRAM SELECTIVE EACH ADDITIONAL VESSEL  05/07/2017   IR RADIOLOGIST EVAL & MGMT  04/16/2017   IR RADIOLOGIST EVAL & MGMT  05/29/2017   IR TRANSCATH/EMBOLIZ  05/07/2017   LOOP RECORDER INSERTION N/A 03/28/2017   Procedure: Loop Recorder Insertion;  Surgeon: Thompson Grayer, MD;  Location: [facility];  Service: Cardiovascular;  Laterality: N/A;   MASTECTOMY W/ SENTINEL NODE BIOPSY Right 06/25/2022   Procedure: RIGHT MASTECTOMY WITH AXILLARY SENTINEL LYMPH NODE BIOPSY;  Surgeon: Donnie Mesa, MD;  Location: [facility];  Service: General;  Laterality: Right;   RADIOLOGY WITH ANESTHESIA N/A 05/07/2017   Procedure: EMBOLIZATION;  Surgeon: Luanne Bras, MD;  Location: [facility];  Service: Radiology;  Laterality: N/A;   RADIOLOGY WITH  ANESTHESIA N/A 08/06/2017   Procedure: RADIOLOGY WITH ANESTHESIA STENTING;  Surgeon: Luanne Bras, MD;  Location: [facility];  Service: Radiology;  Laterality: N/A;   TEE WITHOUT CARDIOVERSION N/A 03/28/2017   Procedure: TRANSESOPHAGEAL ECHOCARDIOGRAM (TEE);  Surgeon: Lelon Perla, MD;  Location: [facility];  Service: Cardiovascular;  Laterality: N/A;    SOCIAL HISTORY: Social History   Socioeconomic History   Marital status: Single    Spouse name: Not on file   Number of children: Not on file   Years of education: Not on file   Highest education level: Not on file  Occupational History   Not on file  Tobacco Use   Smoking status: Every Day    Packs/day: 0.25    Types: Cigarettes   Smokeless tobacco: Never  Vaping Use   Vaping Use: Never used  Substance and Sexual Activity   Alcohol use: No   Drug use: No   Sexual activity: Not Currently    Birth control/protection: Surgical    Comment: Hyst  Other Topics Concern   Not on file  Social History Narrative   Not on file   Social Determinants of Health   Financial Resource Strain: Low Risk  (06/12/2022)   Overall Financial Resource Strain (CARDIA)    Difficulty of  Paying Living Expenses: Not very hard  Food Insecurity: No Food Insecurity (06/12/2022)   Hunger Vital Sign    Worried About Running Out of Food in the Last Year: Never true    Ran Out of Food in the Last Year: Never true  Transportation Needs: No Transportation Needs (06/12/2022)   PRAPARE - Transportation    Lack of Transportation (Medical): No    Lack of Transportation (Non-Medical): No  Physical Activity: Not on file  Stress: Not on file  Social Connections: Not on file  Intimate Partner Violence: Not on file    FAMILY HISTORY: Family History  Problem Relation Age of Onset   Hypertension Father    Diabetes Father    Lung cancer Maternal Aunt        dx after 50   Prostate cancer Other        MGM's brother   Breast cancer Neg Hx     ALLERGIES:   is allergic to lisinopril.  MEDICATIONS:  Current Outpatient Medications  Medication Sig Dispense Refill   anastrozole (ARIMIDEX) 1 MG tablet Take 1 tablet (1 mg total) by mouth daily. 90 tablet 3   amLODipine (NORVASC) 10 MG tablet Take 1 tablet (10 mg total) by mouth daily. 90 tablet 1   aspirin 325 MG tablet Take 1 tablet (325 mg total) by mouth daily. 90 tablet 3   brimonidine (ALPHAGAN) 0.2 % ophthalmic solution Instill 1 drop into both eyes twice a day 15 mL 11   dorzolamide (TRUSOPT) 2 % ophthalmic solution Instill 1 drop into both eyes twice a day 30 mL 3   feeding supplement, ENSURE ENLIVE, (ENSURE ENLIVE) LIQD Take 237 mLs by mouth 2 (two) times daily.     latanoprost (XALATAN) 0.005 % ophthalmic solution Instill 1 drop into both eyes at bedtime 22.5 mL 3   oxyCODONE (OXY IR/ROXICODONE) 5 MG immediate release tablet Take 1 tablet (5 mg total) by mouth every 6 (six) hours as needed for moderate pain. 20 tablet 0   ticagrelor (BRILINTA) 90 MG TABS tablet Take 0.5 tablets (45 mg total) by mouth 2 (two) times daily. 30 tablet 5   timolol (TIMOPTIC) 0.5 % ophthalmic solution Place 1 drop into both eyes in the morning. 45 mL 3   triamcinolone cream (KENALOG) 0.1 % Apply 1 application topically 2 (two) times daily. 30 g 0   No current facility-administered medications for this visit.    REVIEW OF SYSTEMS:   Constitutional: Denies fevers, chills or abnormal night sweats Eyes: Denies blurriness of vision, double vision or watery eyes Ears, nose, mouth, throat, and face: Denies mucositis or sore throat Respiratory: Denies cough, dyspnea or wheezes Cardiovascular: Denies palpitation, chest discomfort or lower extremity swelling Gastrointestinal:  Denies nausea, heartburn or change in bowel habits Skin: Denies abnormal skin rashes Lymphatics: Denies new lymphadenopathy or easy bruising Neurological:Denies numbness, tingling or new weaknesses Behavioral/Psych: Mood is stable, no new  changes  Breast: Palpated a lump in the right breast about 3 months ago All other systems were reviewed with the patient and are negative.  PHYSICAL EXAMINATION: ECOG PERFORMANCE STATUS: 0 - Asymptomatic  There were no vitals filed for this visit.   There were no vitals filed for this visit.  Physical examination not done, telephone visit LABORATORY DATA:  I have reviewed the data as listed Lab Results  Component Value Date   WBC 6.4 06/12/2022   HGB 12.1 06/12/2022   HCT 37.8 06/12/2022   MCV 93.6 06/12/2022   PLT   169 06/12/2022   Lab Results  Component Value Date   NA 139 06/12/2022   K 4.3 06/12/2022   CL 107 06/12/2022   CO2 25 06/12/2022    RADIOGRAPHIC STUDIES: I have personally reviewed the radiological reports and agreed with the findings in the report.  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  She is status post right mastectomy.  Final pathology showed invasive lobular carcinoma, grade 2 measuring about 2.5 x 1.7 x 1 cm along with some DCIS, negative margins, 3 out of 3 lymph nodes without any involvement.  Prior prognostic showed ER 95% strong staining PR 10% strong staining, KI of 5% and HER2 negative, 0 by IHC.  Oncotype of 10, no benefit from adjuvant chemotherapy.  We have however discussed the importance of antiestrogen therapy.  She is reluctant to try tamoxifen given the risk of blood clots.  We have then discussed about aromatase inhibitors, adverse effects and benefits.  She understands that the benefits overweight the risks.  She is agreeable to trying  anastrozole.  She will return to clinic in 3 months for survivorship visit and to review antiestrogen therapy toxicity. She expressed understanding of all the recommendations.  She will return to clinic as scheduled.   I spent 20 minutes in the care of this patient including history, physical exam, review of records, counseling and coordination All questions were answered. The patient knows to call the clinic with any problems, questions or concerns.  I connected with  Agripina Poppe on 07/24/22 by a telephone application and verified that I am speaking with the correct person using two identifiers.   I discussed the limitations of evaluation and management by telemedicine. The patient expressed understanding and agreed to proceed.     Benay Pike, MD 07/24/22

## 2022-07-25 ENCOUNTER — Encounter: Payer: Self-pay | Admitting: *Deleted

## 2022-07-25 DIAGNOSIS — C50311 Malignant neoplasm of lower-inner quadrant of right female breast: Secondary | ICD-10-CM

## 2022-08-08 ENCOUNTER — Other Ambulatory Visit (HOSPITAL_COMMUNITY): Payer: Self-pay

## 2022-08-09 ENCOUNTER — Other Ambulatory Visit (HOSPITAL_COMMUNITY): Payer: Self-pay

## 2022-08-29 ENCOUNTER — Encounter: Payer: Self-pay | Admitting: *Deleted

## 2022-09-03 ENCOUNTER — Other Ambulatory Visit (HOSPITAL_COMMUNITY): Payer: Self-pay

## 2022-09-23 ENCOUNTER — Ambulatory Visit: Payer: Medicare (Managed Care) | Attending: Surgery

## 2022-09-23 DIAGNOSIS — Z483 Aftercare following surgery for neoplasm: Secondary | ICD-10-CM | POA: Insufficient documentation

## 2022-09-23 DIAGNOSIS — C50311 Malignant neoplasm of lower-inner quadrant of right female breast: Secondary | ICD-10-CM | POA: Insufficient documentation

## 2022-09-23 DIAGNOSIS — Z9189 Other specified personal risk factors, not elsewhere classified: Secondary | ICD-10-CM | POA: Insufficient documentation

## 2022-10-04 ENCOUNTER — Other Ambulatory Visit (HOSPITAL_COMMUNITY): Payer: Self-pay

## 2022-10-14 ENCOUNTER — Encounter: Payer: Self-pay | Admitting: *Deleted

## 2022-10-14 ENCOUNTER — Inpatient Hospital Stay: Payer: Medicare (Managed Care) | Attending: Hematology and Oncology | Admitting: Adult Health

## 2022-10-14 ENCOUNTER — Encounter: Payer: Self-pay | Admitting: Adult Health

## 2022-10-14 NOTE — Progress Notes (Deleted)
SURVIVORSHIP VIRTUAL VISIT:  I connected with Amy Howell on 10/14/22 at 11:45 AM EST by my chart video and verified that I am speaking with the correct person using two identifiers.  I discussed the limitations, risks, security and privacy concerns of performing an evaluation and management service virtually and the availability of in person appointments. I also discussed with the patient that there may be a patient responsible charge related to this service. The patient expressed understanding and agreed to proceed.   Patient location:  Provider location: [facility] office  BRIEF ONCOLOGIC HISTORY:  Oncology History  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, right ([place])  05/09/2022 Mammogram   Screening mammogram showed possible mass in the right breast warranting further evaluation.  Further evaluation showed asymmetry with associated architectural distortion confirmed in the inner right breast and additional focal symmetry confirmed in the lower right breast at 6:00 axis region.  Targeted ultrasound showed an irregular hypoechoic shadowing mass within the right breast at 4:00 Axis II centimeters from the nipple measuring 1.4 x 0.9 x 1.2 cm corresponding to the asymmetry with associated architectural distortion seen on mammogram.  There is also an additional irregular hypoechoic area within the right breast at 6:00, 3 cm from the nipple measuring 2.4 x 0.8 x 1.4 cm.  Right axilla normal   06/03/2022 Pathology Results   Pathology from the lower end at 4 o'clock position right breast mass 2 cm from the nipple showed invasive lobular carcinoma and lobular carcinoma in situ overall grade 2 of 3, prognostics mentioned in the [place] are pertinent for ER 95% strong, PR 10%, Ki-67 of 5% and HER2 negative.  Right breast needle core biopsy at the 6 o'clock position showed DCIS, grade 2, possible microinvasion and prognostics pending   06/10/2022 Initial Diagnosis   Malignant neoplasm of  lower-inner quadrant of left breast in female, estrogen receptor positive ([place])   06/10/2022 Cancer Staging   Staging form: Breast, AJCC 8th Edition - Clinical: Stage IA (cT1c, cN0, cM0, G2, ER+, PR+, HER2-) - Signed by Hayden Pedro, PA-C on 06/10/2022 Method of lymph node assessment: Clinical Histologic grading system: 3 grade system   06/25/2022 Definitive Surgery   She had right breast mastectomy which showed grade 1 invasive lobular carcinoma, 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  Lobular carcinoma measures 2.5 x 1.7 x 1 cm, DCIS measures 2.5 x 1.7 x 1.5 cm, margins negative, all lymph nodes benign.  Prior prognostics ER 95% strong staining, PR 10% strong staining, HER2 -0, Ki-67 of 5%   07/05/2022 Genetic Testing   No pathogenic variants detected in [facility] CancerNext-Expanded +RNAinsight Panel.  Report date is 07/05/2022.   The CancerNext-Expanded gene panel offered by [facility] and includes sequencing, rearrangement, and RNA analysis for the following 77 genes: AIP, ALK, APC, ATM, AXIN2, BAP1, BARD1, BLM, BMPR1A, BRCA1, BRCA2, BRIP1, CDC73, CDH1, CDK4, CDKN1B, CDKN2A, CHEK2, CTNNA1, DICER1, FANCC, FH, FLCN, GALNT12, KIF1B, LZTR1, MAX, MEN1, MET, MLH1, MSH2, MSH3, MSH6, MUTYH, NBN, NF1, NF2, NTHL1, PALB2, PHOX2B, PMS2, POT1, PRKAR1A, PTCH1, PTEN, RAD51C, RAD51D, RB1, RECQL, RET, SDHA, SDHAF2, SDHB, SDHC, SDHD, SMAD4, SMARCA4, SMARCB1, SMARCE1, STK11, SUFU, TMEM127, TP53, TSC1, TSC2, VHL and XRCC2 (sequencing and deletion/duplication); EGFR, EGLN1, HOXB13, KIT, MITF, PDGFRA, POLD1, and POLE (sequencing only); EPCAM and GREM1 (deletion/duplication only).    07/09/2022 Oncotype testing   Oncotype score of 10, distant recurrence risk at 9 years of 3%, no benefit of adjuvant chemotherapy  INTERVAL HISTORY:  Amy Howell to review her survivorship care plan detailing her treatment course for breast cancer, as well as monitoring  long-term side effects of that treatment, education regarding health maintenance, screening, and overall wellness and health promotion.     Overall, Amy Howell reports feeling quite well   REVIEW OF SYSTEMS:  Review of Systems  Constitutional:  Negative for appetite change, chills, fatigue, fever and unexpected weight change.  HENT:   Negative for hearing loss, lump/mass and trouble swallowing.   Eyes:  Negative for eye problems and icterus.  Respiratory:  Negative for chest tightness, cough and shortness of breath.   Cardiovascular:  Negative for chest pain, leg swelling and palpitations.  Gastrointestinal:  Negative for abdominal distention, abdominal pain, constipation, diarrhea, nausea and vomiting.  Endocrine: Negative for hot flashes.  Genitourinary:  Negative for difficulty urinating.   Musculoskeletal:  Negative for arthralgias.  Skin:  Negative for itching and rash.  Neurological:  Negative for dizziness, extremity weakness, headaches and numbness.  Hematological:  Negative for adenopathy. Does not bruise/bleed easily.  Psychiatric/Behavioral:  Negative for depression. The patient is not nervous/anxious.    Breast: Denies any new nodularity, masses, tenderness, nipple changes, or nipple discharge.      ONCOLOGY TREATMENT TEAM:  1. Surgeon:  Dr. Georgette Dover at [facility] 2. Medical Oncologist: Dr. Chryl Heck  3. Radiation Oncologist: Dr. Lisbeth Renshaw    PAST MEDICAL/SURGICAL HISTORY:  Past Medical History:  Diagnosis Date   Cataracts, bilateral    Cerebral infarction due to carotid artery stenosis (HCC)    Glaucoma    Hypertension    Stroke ([place])    no residual, "series of mini strokes"   Vertigo    Past Surgical History:  Procedure Laterality Date   ABDOMINAL HYSTERECTOMY     CARDIAC DEFIBRILLATOR PLACEMENT     gum operation     Implantable loop recorder removal  02/01/2019   MDT Reveal LINQ explantation in office by JA   IR 3D INDEPENDENT WKST  05/07/2017   IR  ANGIO INTRA EXTRACRAN SEL COM CAROTID INNOMINATE BILAT MOD SED  04/23/2017   IR ANGIO INTRA EXTRACRAN SEL COM CAROTID INNOMINATE BILAT MOD SED  08/06/2017   IR ANGIO INTRA EXTRACRAN SEL COM CAROTID INNOMINATE UNI R MOD SED  05/07/2017   IR ANGIO VERTEBRAL SEL VERTEBRAL BILAT MOD SED  04/23/2017   IR ANGIO VERTEBRAL SEL VERTEBRAL BILAT MOD SED  08/06/2017   IR ANGIOGRAM FOLLOW UP STUDY  05/07/2017   IR ANGIOGRAM SELECTIVE EACH ADDITIONAL VESSEL  05/07/2017   IR RADIOLOGIST EVAL & MGMT  04/16/2017   IR RADIOLOGIST EVAL & MGMT  05/29/2017   IR TRANSCATH/EMBOLIZ  05/07/2017   LOOP RECORDER INSERTION N/A 03/28/2017   Procedure: Loop Recorder Insertion;  Surgeon: Thompson Grayer, MD;  Location: [facility];  Service: Cardiovascular;  Laterality: N/A;   MASTECTOMY W/ SENTINEL NODE BIOPSY Right 06/25/2022   Procedure: RIGHT MASTECTOMY WITH AXILLARY SENTINEL LYMPH NODE BIOPSY;  Surgeon: Donnie Mesa, MD;  Location: [facility];  Service: General;  Laterality: Right;   RADIOLOGY WITH ANESTHESIA N/A 05/07/2017   Procedure: EMBOLIZATION;  Surgeon: Luanne Bras, MD;  Location: [place];  Service: Radiology;  Laterality: N/A;   RADIOLOGY WITH ANESTHESIA N/A 08/06/2017   Procedure: RADIOLOGY WITH ANESTHESIA STENTING;  Surgeon: Luanne Bras, MD;  Location: [facility];  Service: Radiology;  Laterality: N/A;   TEE WITHOUT CARDIOVERSION N/A 03/28/2017   Procedure: TRANSESOPHAGEAL ECHOCARDIOGRAM (TEE);  Surgeon: Lelon Perla,  MD;  Location: [facility];  Service: Cardiovascular;  Laterality: N/A;     ALLERGIES:  Allergies  Allergen Reactions   Lisinopril Other (See Comments)    Angioedema     CURRENT MEDICATIONS:  Outpatient Encounter Medications as of 10/14/2022  Medication Sig   amLODipine (NORVASC) 10 MG tablet Take 1 tablet (10 mg total) by mouth daily.   anastrozole (ARIMIDEX) 1 MG tablet Take 1 tablet (1 mg total) by mouth daily.   aspirin 325 MG tablet Take 1 tablet (325 mg total) by  mouth daily.   brimonidine (ALPHAGAN) 0.2 % ophthalmic solution Instill 1 drop into both eyes twice a day   dorzolamide (TRUSOPT) 2 % ophthalmic solution Instill 1 drop into both eyes twice a day   feeding supplement, ENSURE ENLIVE, (ENSURE ENLIVE) LIQD Take 237 mLs by mouth 2 (two) times daily.   latanoprost (XALATAN) 0.005 % ophthalmic solution Instill 1 drop into both eyes at bedtime   oxyCODONE (OXY IR/ROXICODONE) 5 MG immediate release tablet Take 1 tablet (5 mg total) by mouth every 6 (six) hours as needed for moderate pain.   ticagrelor (BRILINTA) 90 MG TABS tablet Take 0.5 tablets (45 mg total) by mouth 2 (two) times daily.   timolol (TIMOPTIC) 0.5 % ophthalmic solution Place 1 drop into both eyes in the morning.   triamcinolone cream (KENALOG) 0.1 % Apply 1 application topically 2 (two) times daily.   No facility-administered encounter medications on file as of 10/14/2022.     ONCOLOGIC FAMILY HISTORY:  Family History  Problem Relation Age of Onset   Hypertension Father    Diabetes Father    Lung cancer Maternal Aunt        dx after 65   Prostate cancer Other        MGM's brother   Breast cancer Neg Hx      SOCIAL HISTORY:  Social History   Socioeconomic History   Marital status: Single    Spouse name: Not on file   Number of children: Not on file   Years of education: Not on file   Highest education level: Not on file  Occupational History   Not on file  Tobacco Use   Smoking status: Every Day    Packs/day: 0.25    Types: Cigarettes   Smokeless tobacco: Never  Vaping Use   Vaping Use: Never used  Substance and Sexual Activity   Alcohol use: No   Drug use: No   Sexual activity: Not Currently    Birth control/protection: Surgical    Comment: Hyst  Other Topics Concern   Not on file  Social History Narrative   Not on file   Social Determinants of Health   Financial Resource Strain: Low Risk  (06/12/2022)   Overall Financial Resource Strain (CARDIA)     Difficulty of Paying Living Expenses: Not very hard  Food Insecurity: No Food Insecurity (06/12/2022)   Hunger Vital Sign    Worried About Running Out of Food in the Last Year: Never true    Ran Out of Food in the Last Year: Never true  Transportation Needs: No Transportation Needs (06/12/2022)   PRAPARE - Hydrologist (Medical): No    Lack of Transportation (Non-Medical): No  Physical Activity: Not on file  Stress: Not on file  Social Connections: Not on file  Intimate Partner Violence: Not on file     OBSERVATIONS/OBJECTIVE:  Patient appears well she is in no apparent distress mood and  behavior are normal breathing is nonlabored.  LABORATORY DATA:  None for this visit.  DIAGNOSTIC IMAGING:  None for this visit.      ASSESSMENT AND PLAN:  Amy Howell is a pleasant 66 y.o. 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  She presents to the Survivorship Clinic for our initial meeting and routine follow-up post-completion of treatment for breast cancer.    1. Stage 1A right breast cancer:  Amy Howell is continuing to recover from definitive treatment for breast cancer. She will follow-up with her medical oncologist, Dr. Chryl Heck in 6 months with history and physical exam per surveillance protocol.  She will continue her anti-estrogen therapy with anastrozole. Thus far, she is tolerating the anastrozole well, with minimal side effects. Her mammogram is due June 2024 and she was recommended to continue with annual left breast mammograms.  Today, a comprehensive survivorship care plan and treatment summary was reviewed with the patient today detailing her breast cancer diagnosis, treatment course, potential late/long-term effects of treatment, appropriate follow-up care with recommendations for the future, and patient  education resources.  A copy of this summary, along with a letter will be sent to the patient's primary care provider via mail/fax/In Basket message after today's visit.    #. Problem(s) at Visit______________  #. Bone health:  Given Amy Howell age/history of breast cancer and her current treatment regimen including anti-estrogen therapy with anastrozole, she is at risk for bone demineralization.  She does not appear to have undergone bone density testing therefore I will order this today.  She was given education on specific activities to promote bone health.  #. Cancer screening:  Due to Amy Howell history and her age, she should receive screening for skin cancers, colon cancer, and gynecologic cancers.  The information and recommendations are listed on the patient's comprehensive care plan/treatment summary and were reviewed in detail with the patient.    #. Health maintenance and wellness promotion: Amy Howell was encouraged to consume 5-7 servings of fruits and vegetables per day. We reviewed the "Nutrition Rainbow" handout.  She was also encouraged to engage in moderate to vigorous exercise for 30 minutes per day most days of the week. We discussed the [employer] fitness program, which is designed for cancer survivors to help them become more physically fit after cancer treatments.  She was instructed to limit her alcohol consumption and continue to abstain from tobacco use/***was encouraged stop smoking.     #. Support services/counseling: It is not uncommon for this period of the patient's cancer care trajectory to be one of many emotions and stressors.   She was given information regarding our available services and encouraged to contact me with any questions or for help enrolling in any of our support group/programs.    Follow up instructions:    -Return to cancer center in 6 months for follow-up with Dr. Chryl Heck -Mammogram due in June 2024 -She is welcome to return back to the  Survivorship Clinic at any time; no additional follow-up needed at this time.  -Consider referral back to survivorship as a long-term survivor for continued surveillance  The patient was provided an opportunity to ask questions and all were answered. The patient agreed with the plan and demonstrated an understanding of the instructions.   The patient was advised to call back or seek an in-person evaluation if the symptoms worsen or if the  condition fails to improve as anticipated.   I provided *** minutes of {Blank single:19197::"face-to-face video visit time","non face-to-face telephone visit time"} during this encounter, and > 50% was spent counseling as documented under my assessment & plan.  Wilber Bihari, NP 10/14/22 9:49 AM Medical Oncology and Hematology [facility] [place], [place] [address] Tel. [phone]    Fax. [phone]

## 2022-10-21 ENCOUNTER — Encounter: Payer: Self-pay | Admitting: *Deleted

## 2022-10-21 NOTE — Progress Notes (Signed)
[facility] Quality Team Note  Name: Amy Howell Date of Birth: 07/22/1956 MRN: [id number] Date: 10/21/2022  [facility] Quality Team has reviewed this patient's chart, please see recommendations below:  [facility] Quality Other; (Pt has open gap for blood pressure follow up.  Would be able to close gap if completed and within range before end of 2023.)

## 2022-10-28 ENCOUNTER — Other Ambulatory Visit (HOSPITAL_COMMUNITY): Payer: Self-pay

## 2022-11-01 ENCOUNTER — Encounter: Payer: Self-pay | Admitting: *Deleted

## 2022-11-21 ENCOUNTER — Other Ambulatory Visit: Payer: Self-pay

## 2022-11-21 ENCOUNTER — Inpatient Hospital Stay (HOSPITAL_COMMUNITY)
Admission: EM | Admit: 2022-11-21 | Discharge: 2022-11-27 | DRG: 321 | Disposition: A | Discharge status: Home / Self Care | Payer: Medicare (Managed Care) | Attending: Family Medicine | Admitting: Family Medicine

## 2022-11-21 ENCOUNTER — Encounter (HOSPITAL_COMMUNITY): Payer: Self-pay | Admitting: Internal Medicine

## 2022-11-21 ENCOUNTER — Emergency Department (HOSPITAL_COMMUNITY): Payer: Medicare (Managed Care)

## 2022-11-21 ENCOUNTER — Observation Stay (HOSPITAL_COMMUNITY): Payer: Medicare (Managed Care)

## 2022-11-21 DIAGNOSIS — Z7982 Long term (current) use of aspirin: Secondary | ICD-10-CM

## 2022-11-21 DIAGNOSIS — Z91011 Allergy to milk products: Secondary | ICD-10-CM

## 2022-11-21 DIAGNOSIS — Z8673 Personal history of transient ischemic attack (TIA), and cerebral infarction without residual deficits: Secondary | ICD-10-CM | POA: Diagnosis present

## 2022-11-21 DIAGNOSIS — E119 Type 2 diabetes mellitus without complications: Secondary | ICD-10-CM | POA: Diagnosis present

## 2022-11-21 DIAGNOSIS — I429 Cardiomyopathy, unspecified: Secondary | ICD-10-CM | POA: Diagnosis present

## 2022-11-21 DIAGNOSIS — I161 Hypertensive emergency: Secondary | ICD-10-CM | POA: Diagnosis not present

## 2022-11-21 DIAGNOSIS — R61 Generalized hyperhidrosis: Secondary | ICD-10-CM | POA: Diagnosis not present

## 2022-11-21 DIAGNOSIS — F03A0 Unspecified dementia, mild, without behavioral disturbance, psychotic disturbance, mood disturbance, and anxiety: Secondary | ICD-10-CM | POA: Diagnosis present

## 2022-11-21 DIAGNOSIS — F17210 Nicotine dependence, cigarettes, uncomplicated: Secondary | ICD-10-CM | POA: Diagnosis present

## 2022-11-21 DIAGNOSIS — I5021 Acute systolic (congestive) heart failure: Secondary | ICD-10-CM

## 2022-11-21 DIAGNOSIS — I5043 Acute on chronic combined systolic (congestive) and diastolic (congestive) heart failure: Secondary | ICD-10-CM | POA: Diagnosis present

## 2022-11-21 DIAGNOSIS — I110 Hypertensive heart disease with heart failure: Secondary | ICD-10-CM | POA: Diagnosis present

## 2022-11-21 DIAGNOSIS — Z8249 Family history of ischemic heart disease and other diseases of the circulatory system: Secondary | ICD-10-CM

## 2022-11-21 DIAGNOSIS — J9602 Acute respiratory failure with hypercapnia: Secondary | ICD-10-CM | POA: Diagnosis present

## 2022-11-21 DIAGNOSIS — I213 ST elevation (STEMI) myocardial infarction of unspecified site: Secondary | ICD-10-CM | POA: Diagnosis not present

## 2022-11-21 DIAGNOSIS — Z801 Family history of malignant neoplasm of trachea, bronchus and lung: Secondary | ICD-10-CM

## 2022-11-21 DIAGNOSIS — Z841 Family history of disorders of kidney and ureter: Secondary | ICD-10-CM

## 2022-11-21 DIAGNOSIS — I639 Cerebral infarction, unspecified: Secondary | ICD-10-CM | POA: Diagnosis present

## 2022-11-21 DIAGNOSIS — E872 Acidosis, unspecified: Secondary | ICD-10-CM | POA: Diagnosis not present

## 2022-11-21 DIAGNOSIS — Z833 Family history of diabetes mellitus: Secondary | ICD-10-CM

## 2022-11-21 DIAGNOSIS — J9601 Acute respiratory failure with hypoxia: Secondary | ICD-10-CM | POA: Diagnosis present

## 2022-11-21 DIAGNOSIS — I214 Non-ST elevation (NSTEMI) myocardial infarction: Secondary | ICD-10-CM | POA: Diagnosis not present

## 2022-11-21 DIAGNOSIS — I10 Essential (primary) hypertension: Secondary | ICD-10-CM | POA: Diagnosis not present

## 2022-11-21 DIAGNOSIS — Z91148 Patient's other noncompliance with medication regimen for other reason: Secondary | ICD-10-CM

## 2022-11-21 DIAGNOSIS — I2510 Atherosclerotic heart disease of native coronary artery without angina pectoris: Secondary | ICD-10-CM | POA: Diagnosis present

## 2022-11-21 DIAGNOSIS — Z95828 Presence of other vascular implants and grafts: Secondary | ICD-10-CM

## 2022-11-21 DIAGNOSIS — R7303 Prediabetes: Secondary | ICD-10-CM

## 2022-11-21 DIAGNOSIS — Z955 Presence of coronary angioplasty implant and graft: Secondary | ICD-10-CM

## 2022-11-21 DIAGNOSIS — Z66 Do not resuscitate: Secondary | ICD-10-CM | POA: Diagnosis present

## 2022-11-21 DIAGNOSIS — Z91013 Allergy to seafood: Secondary | ICD-10-CM

## 2022-11-21 DIAGNOSIS — N179 Acute kidney failure, unspecified: Secondary | ICD-10-CM

## 2022-11-21 DIAGNOSIS — F32A Depression, unspecified: Secondary | ICD-10-CM | POA: Diagnosis not present

## 2022-11-21 DIAGNOSIS — I5022 Chronic systolic (congestive) heart failure: Secondary | ICD-10-CM | POA: Diagnosis present

## 2022-11-21 DIAGNOSIS — Z888 Allergy status to other drugs, medicaments and biological substances status: Secondary | ICD-10-CM

## 2022-11-21 DIAGNOSIS — Z79899 Other long term (current) drug therapy: Secondary | ICD-10-CM

## 2022-11-21 DIAGNOSIS — R0600 Dyspnea, unspecified: Secondary | ICD-10-CM | POA: Diagnosis not present

## 2022-11-21 DIAGNOSIS — Z7902 Long term (current) use of antithrombotics/antiplatelets: Secondary | ICD-10-CM

## 2022-11-21 DIAGNOSIS — Z95810 Presence of automatic (implantable) cardiac defibrillator: Secondary | ICD-10-CM

## 2022-11-21 DIAGNOSIS — C50311 Malignant neoplasm of lower-inner quadrant of right female breast: Secondary | ICD-10-CM | POA: Diagnosis present

## 2022-11-21 DIAGNOSIS — Z1152 Encounter for screening for COVID-19: Secondary | ICD-10-CM

## 2022-11-21 DIAGNOSIS — Z79811 Long term (current) use of aromatase inhibitors: Secondary | ICD-10-CM

## 2022-11-21 DIAGNOSIS — C50319 Malignant neoplasm of lower-inner quadrant of unspecified female breast: Secondary | ICD-10-CM | POA: Diagnosis present

## 2022-11-21 DIAGNOSIS — R531 Weakness: Secondary | ICD-10-CM | POA: Diagnosis not present

## 2022-11-21 DIAGNOSIS — H409 Unspecified glaucoma: Secondary | ICD-10-CM | POA: Diagnosis present

## 2022-11-21 DIAGNOSIS — Z91014 Allergy to mammalian meats: Secondary | ICD-10-CM

## 2022-11-21 DIAGNOSIS — Z9011 Acquired absence of right breast and nipple: Secondary | ICD-10-CM

## 2022-11-21 DIAGNOSIS — E8729 Other acidosis: Secondary | ICD-10-CM

## 2022-11-21 HISTORY — DX: Tobacco use: Z72.0

## 2022-11-21 HISTORY — DX: Type 2 diabetes mellitus without complications: E11.9

## 2022-11-21 LAB — CBC
HCT: 41.5 % (ref 36.0–46.0)
Hemoglobin: 12.2 g/dL (ref 12.0–15.0)
MCH: 29.7 pg (ref 26.0–34.0)
MCHC: 29.4 g/dL — ABNORMAL LOW (ref 30.0–36.0)
MCV: 101 fL — ABNORMAL HIGH (ref 80.0–100.0)
Platelets: 177 10*3/uL (ref 150–400)
RBC: 4.11 MIL/uL (ref 3.87–5.11)
RDW: 13.6 % (ref 11.5–15.5)
WBC: 6.3 10*3/uL (ref 4.0–10.5)
nRBC: 0 % (ref 0.0–0.2)

## 2022-11-21 LAB — BRAIN NATRIURETIC PEPTIDE: B Natriuretic Peptide: 1542.9 pg/mL — ABNORMAL HIGH (ref 0.0–100.0)

## 2022-11-21 LAB — I-STAT CHEM 8, ED
BUN: 23 mg/dL (ref 8–23)
Calcium, Ion: 1.18 mmol/L (ref 1.15–1.40)
Chloride: 103 mmol/L (ref 98–111)
Creatinine, Ser: 1.2 mg/dL — ABNORMAL HIGH (ref 0.44–1.00)
Glucose, Bld: 289 mg/dL — ABNORMAL HIGH (ref 70–99)
HCT: 42 % (ref 36.0–46.0)
Hemoglobin: 14.3 g/dL (ref 12.0–15.0)
Potassium: 4.2 mmol/L (ref 3.5–5.1)
Sodium: 139 mmol/L (ref 135–145)
TCO2: 21 mmol/L — ABNORMAL LOW (ref 22–32)

## 2022-11-21 LAB — CBG MONITORING, ED
Glucose-Capillary: 119 mg/dL — ABNORMAL HIGH (ref 70–99)
Glucose-Capillary: 95 mg/dL (ref 70–99)
Glucose-Capillary: 107 mg/dL — ABNORMAL HIGH (ref 70–99)

## 2022-11-21 LAB — I-STAT VENOUS BLOOD GAS, ED
Acid-base deficit: 12 mmol/L — ABNORMAL HIGH (ref 0.0–2.0)
Acid-base deficit: 5 mmol/L — ABNORMAL HIGH (ref 0.0–2.0)
Bicarbonate: 18.7 mmol/L — ABNORMAL LOW (ref 20.0–28.0)
Bicarbonate: 20.4 mmol/L (ref 20.0–28.0)
Calcium, Ion: 1.17 mmol/L (ref 1.15–1.40)
Calcium, Ion: 1.2 mmol/L (ref 1.15–1.40)
HCT: 41 % (ref 36.0–46.0)
HCT: 35 % — ABNORMAL LOW (ref 36.0–46.0)
Hemoglobin: 13.9 g/dL (ref 12.0–15.0)
Hemoglobin: 11.9 g/dL — ABNORMAL LOW (ref 12.0–15.0)
O2 Saturation: 33 %
O2 Saturation: 100 %
Potassium: 4.1 mmol/L (ref 3.5–5.1)
Potassium: 4.5 mmol/L (ref 3.5–5.1)
Sodium: 138 mmol/L (ref 135–145)
Sodium: 140 mmol/L (ref 135–145)
TCO2: 21 mmol/L — ABNORMAL LOW (ref 22–32)
TCO2: 21 mmol/L — ABNORMAL LOW (ref 22–32)
pCO2, Ven: 62.5 mmHg — ABNORMAL HIGH (ref 44–60)
pCO2, Ven: 37.9 mmHg — ABNORMAL LOW (ref 44–60)
pH, Ven: 7.084 — CL (ref 7.25–7.43)
pH, Ven: 7.338 (ref 7.25–7.43)
pO2, Ven: 28 mmHg — CL (ref 32–45)
pO2, Ven: 214 mmHg — ABNORMAL HIGH (ref 32–45)

## 2022-11-21 LAB — COMPREHENSIVE METABOLIC PANEL
ALT: 25 U/L (ref 0–44)
AST: 40 U/L (ref 15–41)
Albumin: 3.5 g/dL (ref 3.5–5.0)
Alkaline Phosphatase: 103 U/L (ref 38–126)
Anion gap: 13 (ref 5–15)
BUN: 20 mg/dL (ref 8–23)
CO2: 17 mmol/L — ABNORMAL LOW (ref 22–32)
Calcium: 8.8 mg/dL — ABNORMAL LOW (ref 8.9–10.3)
Chloride: 105 mmol/L (ref 98–111)
Creatinine, Ser: 1.36 mg/dL — ABNORMAL HIGH (ref 0.44–1.00)
GFR, Estimated: 42 mL/min — ABNORMAL LOW (ref >60)
Glucose, Bld: 293 mg/dL — ABNORMAL HIGH (ref 70–99)
Potassium: 4 mmol/L (ref 3.5–5.1)
Sodium: 135 mmol/L (ref 135–145)
Total Bilirubin: 0.5 mg/dL (ref 0.3–1.2)
Total Protein: 7.5 g/dL (ref 6.5–8.1)

## 2022-11-21 LAB — RESP PANEL BY RT-PCR (RSV, FLU A&B, COVID)  RVPGX2
Influenza A by PCR: NEGATIVE
Influenza B by PCR: NEGATIVE
Resp Syncytial Virus by PCR: NEGATIVE
SARS Coronavirus 2 by RT PCR: NEGATIVE

## 2022-11-21 LAB — TROPONIN I (HIGH SENSITIVITY)
Troponin I (High Sensitivity): 8 ng/L
Troponin I (High Sensitivity): 37 ng/L — ABNORMAL HIGH
Troponin I (High Sensitivity): 80 ng/L — ABNORMAL HIGH (ref <18)
Troponin I (High Sensitivity): 79 ng/L — ABNORMAL HIGH (ref <18)

## 2022-11-21 LAB — HIV ANTIBODY (ROUTINE TESTING W REFLEX): HIV Screen 4th Generation wRfx: NONREACTIVE

## 2022-11-21 MED ORDER — ACETAMINOPHEN 650 MG RE SUPP: 650.0000 mg | Freq: Four times a day (QID) | RECTAL | Status: DC | PRN

## 2022-11-21 MED ORDER — ONDANSETRON HCL 4 MG PO TABS: 4.0000 mg | ORAL_TABLET | Freq: Four times a day (QID) | ORAL | Status: DC | PRN

## 2022-11-21 MED ORDER — LATANOPROST 0.005 % OP SOLN
1.0000 [drp] | Freq: Every day | OPHTHALMIC | Status: DC
  Administered 2022-11-22 – 2022-11-26 (×5): 1 [drp] via OPHTHALMIC
  Filled 2022-11-21 (×2): qty 2.5

## 2022-11-21 MED ORDER — TICAGRELOR 90 MG PO TABS
45.0000 mg | ORAL_TABLET | Freq: Two times a day (BID) | ORAL | Status: DC
  Administered 2022-11-21 – 2022-11-26 (×11): 45 mg via ORAL
  Filled 2022-11-21 (×10): qty 1

## 2022-11-21 MED ORDER — HYDRALAZINE HCL 20 MG/ML IJ SOLN: 5.0000 mg | INTRAMUSCULAR | Status: DC | PRN

## 2022-11-21 MED ORDER — INSULIN ASPART 100 UNIT/ML IJ SOLN: 0.0000 [IU] | Freq: Every day | INTRAMUSCULAR | Status: DC

## 2022-11-21 MED ORDER — ONDANSETRON HCL 4 MG/2ML IJ SOLN: 4.0000 mg | Freq: Four times a day (QID) | INTRAMUSCULAR | Status: DC | PRN

## 2022-11-21 MED ORDER — NITROGLYCERIN IN D5W 200-5 MCG/ML-% IV SOLN
0.0000 ug/min | INTRAVENOUS | Status: DC
  Administered 2022-11-21: 75 ug/min via INTRAVENOUS
  Filled 2022-11-21: qty 250

## 2022-11-21 MED ORDER — POLYETHYLENE GLYCOL 3350 17 G PO PACK: 17.0000 g | PACK | Freq: Every day | ORAL | Status: DC | PRN

## 2022-11-21 MED ORDER — ACETAMINOPHEN 325 MG PO TABS: 650.0000 mg | ORAL_TABLET | Freq: Four times a day (QID) | ORAL | Status: DC | PRN

## 2022-11-21 MED ORDER — FUROSEMIDE 10 MG/ML IJ SOLN
40.0000 mg | Freq: Two times a day (BID) | INTRAMUSCULAR | Status: DC
  Administered 2022-11-21 – 2022-11-22 (×4): 40 mg via INTRAVENOUS
  Filled 2022-11-21 (×4): qty 4

## 2022-11-21 MED ORDER — BISACODYL 5 MG PO TBEC: 5.0000 mg | DELAYED_RELEASE_TABLET | Freq: Every day | ORAL | Status: DC | PRN

## 2022-11-21 MED ORDER — TRAZODONE HCL 50 MG PO TABS: 25.0000 mg | ORAL_TABLET | Freq: Every evening | ORAL | Status: DC | PRN

## 2022-11-21 MED ORDER — INSULIN ASPART 100 UNIT/ML IJ SOLN: 0.0000 [IU] | Freq: Three times a day (TID) | INTRAMUSCULAR | Status: DC

## 2022-11-21 MED ORDER — DORZOLAMIDE HCL 2 % OP SOLN
1.0000 [drp] | Freq: Two times a day (BID) | OPHTHALMIC | Status: DC
  Administered 2022-11-21 – 2022-11-26 (×11): 1 [drp] via OPHTHALMIC
  Filled 2022-11-21: qty 10

## 2022-11-21 MED ORDER — SODIUM CHLORIDE 0.9% FLUSH
3.0000 mL | Freq: Two times a day (BID) | INTRAVENOUS | Status: DC
  Administered 2022-11-21 – 2022-11-24 (×6): 3 mL via INTRAVENOUS

## 2022-11-21 MED ORDER — ENOXAPARIN SODIUM 30 MG/0.3ML IJ SOSY
30.0000 mg | PREFILLED_SYRINGE | INTRAMUSCULAR | Status: DC
  Filled 2022-11-21 (×4): qty 0.3

## 2022-11-21 MED ORDER — TIMOLOL MALEATE 0.5 % OP SOLN
1.0000 [drp] | Freq: Every day | OPHTHALMIC | Status: DC
  Administered 2022-11-21 – 2022-11-27 (×6): 1 [drp] via OPHTHALMIC
  Filled 2022-11-21 (×2): qty 5

## 2022-11-21 MED ORDER — AMLODIPINE BESYLATE 10 MG PO TABS
10.0000 mg | ORAL_TABLET | Freq: Every day | ORAL | Status: DC
  Administered 2022-11-21 – 2022-11-27 (×6): 10 mg via ORAL
  Filled 2022-11-21: qty 1
  Filled 2022-11-21: qty 2
  Filled 2022-11-21: qty 1
  Filled 2022-11-21: qty 2
  Filled 2022-11-21 (×2): qty 1

## 2022-11-21 MED ORDER — NICOTINE 14 MG/24HR TD PT24
14.0000 mg | MEDICATED_PATCH | Freq: Every day | TRANSDERMAL | Status: DC
  Filled 2022-11-21 (×5): qty 1

## 2022-11-21 MED ORDER — ASPIRIN 325 MG PO TABS
325.0000 mg | ORAL_TABLET | Freq: Every day | ORAL | Status: DC
  Administered 2022-11-21 – 2022-11-22 (×2): 325 mg via ORAL
  Filled 2022-11-21 (×2): qty 1

## 2022-11-21 NOTE — ED Triage Notes (Signed)
BIB [facility] from home. C/O is SOB, no resp hx. Found on the floor, do not believe that she fell, room air 78%, audible rales in all lobes bilat, diaphoretic, denied any pain. Cpap placed on pt. 90% on CPAP. Per pt she started to feel better.

## 2022-11-21 NOTE — H&P (Signed)
History and Physical    PatientLeeandra Howell JAS:[id number] DOB: 1956/08/02 DOA: 11/21/2022 DOS: the patient was seen and examined on 11/21/2022 PCP: Ladell Pier, MD  Patient coming from: Home - lives with daughter; Donald Prose: Daughter, [phone]   Chief Complaint: SOB  HPI: Amy Howell is a 66 y.o. female with medical history significant of CVA, glaucoma, and HTN presenting with SOB, uncontrolled HTN. She reports that starting last week she might have picked up a bug, getting a lot of phlegm.  It was clear and frothy.  It affected her more when she laid down so she started propping up pillows.  It happened again last night and she went to the bathroom and she couldn't stop coughing and couldn't catch her breath.  She feels a lot better now, improved with BIPAP.  Last echo was in 2018 showed preserved EF and grade 1 DD.  She has a lot of polyuria.  No CP.  She has nocturia, up "at least 20 times."  She was told she had DM when she came to [facility].  "I'm not too good with taking pills".  She changed her diet and "no longer had sugar diabetes."  She reports apparent angioedema from ACE and various other side effects.  She is not taking anything for HTN or DM.    She went to the [place] about 2 years ago.  His pressure was always in the 200s.  He was blind, had dementia, mild DM.  He was found to have "kidney failure."  He died from this.    ER Course:  Carryover, per Dr. Bridgett Larsson:  Carryover for malignant HTN. On NTG gtts and Bipap. 66 yo AAF with hx of breast cancer, HTN. Reportedly non-compliant with meds.      Review of Systems: As mentioned in the history of present illness. All other systems reviewed and are negative. Past Medical History:  Diagnosis Date   Cataracts, bilateral    Cerebral infarction due to carotid artery stenosis (HCC)    Diabetes mellitus type 2 in nonobese (HCC)    Glaucoma    Hypertension    Stroke ([place])    no residual, "series of mini strokes"    Tobacco abuse    Vertigo    Past Surgical History:  Procedure Laterality Date   ABDOMINAL HYSTERECTOMY     CARDIAC DEFIBRILLATOR PLACEMENT     gum operation     Implantable loop recorder removal  02/01/2019   MDT Reveal LINQ explantation in office by JA   IR [place]  05/07/2017   IR ANGIO INTRA EXTRACRAN SEL COM CAROTID INNOMINATE BILAT MOD SED  04/23/2017   IR ANGIO INTRA EXTRACRAN SEL COM CAROTID INNOMINATE BILAT MOD SED  08/06/2017   IR ANGIO INTRA EXTRACRAN SEL COM CAROTID INNOMINATE UNI R MOD SED  05/07/2017   IR ANGIO VERTEBRAL SEL VERTEBRAL BILAT MOD SED  04/23/2017   IR ANGIO VERTEBRAL SEL VERTEBRAL BILAT MOD SED  08/06/2017   IR ANGIOGRAM FOLLOW UP STUDY  05/07/2017   IR ANGIOGRAM SELECTIVE EACH ADDITIONAL VESSEL  05/07/2017   IR RADIOLOGIST EVAL & MGMT  04/16/2017   IR RADIOLOGIST EVAL & MGMT  05/29/2017   IR TRANSCATH/EMBOLIZ  05/07/2017   LOOP RECORDER INSERTION N/A 03/28/2017   Procedure: Loop Recorder Insertion;  Surgeon: Thompson Grayer, MD;  Location: [place] [facility];  Service: Cardiovascular;  Laterality: N/A;   MASTECTOMY W/ SENTINEL NODE BIOPSY Right 06/25/2022   Procedure: RIGHT MASTECTOMY WITH AXILLARY SENTINEL LYMPH  NODE BIOPSY;  Surgeon: Donnie Mesa, MD;  Location: [place];  Service: General;  Laterality: Right;   RADIOLOGY WITH ANESTHESIA N/A 05/07/2017   Procedure: EMBOLIZATION;  Surgeon: Luanne Bras, MD;  Location: [place];  Service: Radiology;  Laterality: N/A;   RADIOLOGY WITH ANESTHESIA N/A 08/06/2017   Procedure: RADIOLOGY WITH ANESTHESIA STENTING;  Surgeon: Luanne Bras, MD;  Location: [place];  Service: Radiology;  Laterality: N/A;   TEE WITHOUT CARDIOVERSION N/A 03/28/2017   Procedure: TRANSESOPHAGEAL ECHOCARDIOGRAM (TEE);  Surgeon: Lelon Perla, MD;  Location: [facility]OS[facility];  Service: Cardiovascular;  Laterality: N/A;   Social History:  reports that she has been smoking cigarettes. She has a 28.00 pack-year smoking history. She has  never used smokeless tobacco. She reports that she does not drink alcohol and does not use drugs.  Allergies  Allergen Reactions   Lisinopril Other (See Comments)    Angioedema    Family History  Problem Relation Age of Onset   Hypertension Father    Diabetes Father    Lung cancer Maternal Aunt        dx after 62   Prostate cancer Other        MGM's brother   Breast cancer Neg Hx     Prior to Admission medications   Medication Sig Start Date End Date Taking? Authorizing Provider  aspirin 325 MG tablet Take 1 tablet (325 mg total) by mouth daily. 06/30/17  Yes Hairston, Maylon Peppers, FNP  dorzolamide (TRUSOPT) 2 % ophthalmic solution Instill 1 drop into both eyes twice a day 02/19/22  Yes   feeding supplement, ENSURE ENLIVE, (ENSURE ENLIVE) LIQD Take 237 mLs by mouth daily.   Yes [provider]  latanoprost (XALATAN) 0.005 % ophthalmic solution Instill 1 drop into both eyes at bedtime 02/19/22  Yes   ticagrelor (BRILINTA) 90 MG TABS tablet Take 0.5 tablets (45 mg total) by mouth 2 (two) times daily. 06/06/22  Yes Ladell Pier, MD  timolol (TIMOPTIC) 0.5 % ophthalmic solution Place 1 drop into both eyes in the morning. 07/10/22  Yes   triamcinolone cream (KENALOG) 0.1 % Apply 1 application topically 2 (two) times daily. Patient taking differently: Apply 1 application  topically daily as needed (for rash/itching). 12/06/21  Yes Ladell Pier, MD  amLODipine (NORVASC) 10 MG tablet Take 1 tablet (10 mg total) by mouth daily. Patient not taking: Reported on 11/21/2022 04/12/22   Ladell Pier, MD  anastrozole (ARIMIDEX) 1 MG tablet Take 1 tablet (1 mg total) by mouth daily. Patient not taking: Reported on 11/21/2022 07/24/22   Benay Pike, MD    Physical Exam: Vitals:   11/21/22 1500 11/21/22 1614 11/21/22 1615 11/21/22 1700  BP: (!) 149/83  (!) 157/81 (!) 167/86  Pulse: 83  84 76  Resp: '19  20 19  '$ Temp:  98.3 F (36.8 C)    TempSrc:      SpO2: 100%  100% 100%   Weight:      Height:       General:  Appears calm and comfortable and is in NAD Eyes:  EOMI, normal lids, iris ENT:  grossly normal hearing, lips & tongue, mmm Neck:  no LAD, masses or thyromegaly Cardiovascular:  RRR, no m/r/g. No LE edema.  Respiratory:   CTA bilaterally with no wheezes/rales/rhonchi.  Normal respiratory effort. Abdomen:  soft, NT, ND Skin:  no rash or induration seen on limited exam Musculoskeletal:  grossly normal tone BUE/BLE, good ROM, no bony abnormality  Psychiatric:  grossly normal mood and affect, speech fluent and appropriate, AOx3 Neurologic:  CN 2-12 grossly intact, moves all extremities in coordinated fashion   Radiological Exams on Admission: Independently reviewed - see discussion in A/P where applicable  DG Chest Port 1 View  Result Date: 11/21/2022 CLINICAL DATA:  Shortness of breath EXAM: PORTABLE CHEST 1 VIEW COMPARISON:  None. FINDINGS: The heart is enlarged. There central pulmonary vascular congestion. There central interstitial and hazy opacities bilaterally. There is no focal lung consolidation or pneumothorax. No acute fractures are seen. IMPRESSION: 1. Cardiomegaly with central pulmonary vascular congestion. 2. Central interstitial and hazy opacities bilaterally, likely pulmonary edema. Electronically Signed   By: Ronney Asters M.D.   On: 11/21/2022 01:43    EKG: Independently reviewed.  Sinus tachycardia with rate 121; LVH, nonspecific ST changes with no evidence of acute ischemia   Labs on Admission: I have personally reviewed the available labs and imaging studies at the time of the admission.  Pertinent labs:    CO2 17 Glucose 293 BUN 20/Creatinine 1.36/GFR 42 BNP 1542.9 HS troponin 8, 37, 80, 79 VBG: 7.084/62.5/18.7 -> 7.338/37.9/20.4 Unremarkable CBC COVID/flu/RSV negative   Assessment and Plan: Principal Problem:   Malignant hypertension Active Problems:   Diabetes mellitus type 2 in nonobese [facility])    Hypertensive  crisis -Patient presenting with severe HTN and SOB concerning for hypertensive crisis -She reports that she does not like to take any medications and so does not take them -She has been having symptoms suggestive of CHF and also has elevated troponin, consistent with end organ failure and so HTN emergency -She was started on NTG drip and BIPAP on arrival but was able to wean off these medications -Based on her BP control no longer in a dangerous range, will observe patient on telemetry rather than placing in ICU -Elevated troponin is likely associated with demand ischemia -Will request Echo -Resume home amlodipine -Will add prn hydralazine   H/o CVA -Basilar artery occlusion, s/p stent placement -She is compliant with ASA and Brilinta (but no other medications)  Recent breast cancer -Underwent R breat mastectomy in 06/2022 with negative margins -She was given anastrozole but refused to take it after the first 3 doses made her somnolent -She is late for oncology f/u  DM -She was previously on medications for this issue but changed her diet with success -Her last A1c was 5.7 on 5/12 -However, glucose overnight 293 - diagnostic for DM -Will check A1c -Cover with moderate-scale SSI   Glaucoma -Continue dorzolamide, latanoprost, and timolol  DNR -I have discussed code status with the patient and she would not desire resuscitation and would prefer to die a natural death should that situation arise. -She will need a gold out of facility DNR form at the time of discharge     Advance Care Planning:   Code Status: DNR   Consults: CHF navigator; TOC team; nutrition; PT  DVT Prophylaxis: Lovenox  Family Communication: None present; she declined to have me call family at the time of admission  Severity of Illness: The appropriate patient status for this patient is OBSERVATION. Observation status is judged to be reasonable and necessary in order to provide the required intensity of  service to ensure the patient's safety. The patient's presenting symptoms, physical exam findings, and initial radiographic and laboratory data in the context of their medical condition is felt to place them at decreased risk for further clinical deterioration. Furthermore, it is anticipated that the patient will be medically  stable for discharge from the hospital within 2 midnights of admission.   Author: Karmen Bongo, MD 11/21/2022 6:12 PM  For on call review [email or web address].

## 2022-11-21 NOTE — ED Notes (Signed)
Pt ambulatory to restroom with steady gait. Assisted back into bed. VSS. Denies pain, call bell within reach.

## 2022-11-21 NOTE — ED Notes (Signed)
Patient readjusted in the bed and provided partial linen change

## 2022-11-21 NOTE — ED Notes (Signed)
Spoke to resp reference the peak flow order at this time

## 2022-11-21 NOTE — ED Notes (Signed)
Per Dr. Leonette Monarch d/c nitro drip at this time

## 2022-11-21 NOTE — Discharge Planning (Signed)
EDCM following for recommendations for home health services.

## 2022-11-21 NOTE — ED Provider Notes (Signed)
[facility] EMERGENCY DEPARTMENT Provider Note  CSN: [id number] Arrival date & time: 11/21/22 0119  Chief Complaint(s) Shortness of Breath Triage Note  0120 BIB [facility] from home. C/O is SOB, no resp hx. Found on the floor, do not believe that she fell, room air 78%, audible rales in all lobes bilat, diaphoretic, denied any pain. Cpap placed on pt. 90% on CPAP. Per pt she started to feel better.   HPI Amy Howell is a 66 y.o. female with a past medical history listed below including hypertension and right breast cancer s/p mastectomy who presents to the emergency department in respiratory distress.  Sudden onset this evening.  EMS called and noted patient was on the floor and respiratory distress.  They noted diffuse rales throughout and an elevated palpable blood pressure with systolics above 185.  Sats in the 70s on room air.  Patient was diaphoretic but denied any chest pain.  She was placed on CPAP and improving her work of breathing and saturations.  Upon arrival patient was in less distress and able to answer few questions. She reported having a cough for approximately 1 week but no shortness of breath. States that she has not been compliant with her blood pressure medicine.  She cannot tell me for how long. She confirms no chest pain. No other physical complaints.  The history is provided by the patient and the EMS personnel.    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, right ([place]) 06/10/2022   Statin declined 12/06/2021   Colon cancer screening declined 07/15/2019   Mammogram declined 07/15/2019   Type 2  diabetes mellitus with glaucoma and cataract ([place]) 01/08/2019   Tobacco dependence 03/30/2018   Glaucoma of both eyes 03/30/2018   History of loop recorder 03/30/2018   Prediabetes 04/10/2017   Cryptogenic stroke ([place])    Aneurysm of middle cerebral artery    Weight loss, unintentional    Basilar artery stenosis    Essential hypertension 03/26/2017   Home Medication(s) Prior to Admission medications   Medication Sig Start Date End Date Taking? Authorizing Provider  aspirin 325 MG tablet Take 1 tablet (325 mg total) by mouth daily. 06/30/17  Yes Hairston, Maylon Peppers, FNP  dorzolamide (TRUSOPT) 2 % ophthalmic solution Instill 1 drop into both eyes twice a day 02/19/22  Yes   feeding supplement, ENSURE ENLIVE, (ENSURE ENLIVE) LIQD Take 237 mLs by mouth daily.   Yes [provider]  latanoprost (XALATAN) 0.005 % ophthalmic solution Instill 1 drop into both eyes at bedtime 02/19/22  Yes   ticagrelor (BRILINTA) 90 MG TABS tablet Take 0.5 tablets (45 mg total) by mouth 2 (two) times daily. 06/06/22  Yes Ladell Pier, MD  timolol (TIMOPTIC) 0.5 % ophthalmic solution Place 1 drop into both eyes in the morning. 07/10/22  Yes   triamcinolone cream (KENALOG) 0.1 % Apply 1 application topically 2 (two) times daily. Patient taking differently: Apply 1 application  topically daily as needed (for rash/itching). 12/06/21  Yes Ladell Pier, MD  amLODipine (NORVASC) 10 MG tablet Take 1 tablet (10 mg total)  by mouth daily. Patient not taking: Reported on 11/21/2022 04/12/22   Ladell Pier, MD  anastrozole (ARIMIDEX) 1 MG tablet Take 1 tablet (1 mg total) by mouth daily. Patient not taking: Reported on 11/21/2022 07/24/22   Benay Pike, MD                                                                                                                                    Allergies Lisinopril  Review of Systems Review of Systems As noted in HPI  Physical Exam Vital Signs  I have  reviewed the triage vital signs BP 211/113  Pulse (!) 103   Resp (!) 22   Ht '5\' 1"'$  (1.549 m)   Wt 45.4 kg   SpO2 100%   BMI 18.89 kg/m   Physical Exam Vitals reviewed.  Constitutional:      General: She is not in acute distress.    Appearance: She is well-developed. She is not diaphoretic.  HENT:     Head: Normocephalic and atraumatic.     Nose: Nose normal.  Eyes:     General: No scleral icterus.       Right eye: No discharge.        Left eye: No discharge.     Conjunctiva/sclera: Conjunctivae normal.     Pupils: Pupils are equal, round, and reactive to light.  Cardiovascular:     Rate and Rhythm: Regular rhythm. Tachycardia present.     Heart sounds: No murmur heard.    No friction rub. No gallop.  Pulmonary:     Effort: Tachypnea and respiratory distress present.     Breath sounds: No stridor. Examination of the right-lower field reveals rales. Examination of the left-lower field reveals rales. Rales present.  Abdominal:     General: There is no distension.     Palpations: Abdomen is soft.     Tenderness: There is no abdominal tenderness.  Musculoskeletal:        General: No tenderness.     Cervical back: Normal range of motion and neck supple.     Right lower leg: No edema.     Left lower leg: No edema.  Skin:    General: Skin is warm and dry.     Findings: No erythema or rash.  Neurological:     Mental Status: She is alert and oriented to person, place, and time.     ED Results and Treatments Labs (all labs ordered are listed, but only abnormal results are displayed) Labs Reviewed  COMPREHENSIVE METABOLIC PANEL - Abnormal; Notable for the following components:      Result Value   CO2 17 (*)    Glucose, Bld 293 (*)    Creatinine, Ser 1.36 (*)    Calcium 8.8 (*)    GFR, Estimated 42 (*)    All other components within normal limits  CBC - Abnormal; Notable for the following components:  MCV 101.0 (*)    MCHC 29.4 (*)    All other components within  normal limits  BRAIN NATRIURETIC PEPTIDE - Abnormal; Notable for the following components:   B Natriuretic Peptide 1,542.9 (*)    All other components within normal limits  I-STAT CHEM 8, ED - Abnormal; Notable for the following components:   Creatinine, Ser 1.20 (*)    Glucose, Bld 289 (*)    TCO2 21 (*)    All other components within normal limits  I-STAT VENOUS BLOOD GAS, ED - Abnormal; Notable for the following components:   pH, Ven 7.084 (*)    pCO2, Ven 62.5 (*)    pO2, Ven 28 (*)    Bicarbonate 18.7 (*)    TCO2 21 (*)    Acid-base deficit 12.0 (*)    All other components within normal limits  RESP PANEL BY RT-PCR (RSV, FLU A&B, COVID)  RVPGX2  I-STAT VENOUS BLOOD GAS, ED  TROPONIN I (HIGH SENSITIVITY)  TROPONIN I (HIGH SENSITIVITY)                                                                                                                         EKG  EKG Interpretation  Date/Time:  Thursday November 21 2022 01:25:19 EST Ventricular Rate:  121 PR Interval:  150 QRS Duration: 108 QT Interval:  332 QTC Calculation: 471 R Axis:   124 Text Interpretation: Sinus or ectopic atrial tachycardia Left atrial enlargement Probable lateral infarct, age indeterminate Probable anterior infarct, age indeterminate Borderline ST elevation, lateral leads rate dependent changes. Otherwise no significant change Confirmed by Addison Lank [phone]) on 11/21/2022 1:35:11 AM       Radiology DG Chest Port 1 View  Result Date: 11/21/2022 CLINICAL DATA:  Shortness of breath EXAM: PORTABLE CHEST 1 VIEW COMPARISON:  None. FINDINGS: The heart is enlarged. There central pulmonary vascular congestion. There central interstitial and hazy opacities bilaterally. There is no focal lung consolidation or pneumothorax. No acute fractures are seen. IMPRESSION: 1. Cardiomegaly with central pulmonary vascular congestion. 2. Central interstitial and hazy opacities bilaterally, likely pulmonary edema.  Electronically Signed   By: Ronney Asters M.D.   On: 11/21/2022 01:43    Medications Ordered in ED Medications  nitroGLYCERIN 50 mg in dextrose 5 % 250 mL (0.2 mg/mL) infusion (0 mcg/min Intravenous Stopped 11/21/22 0342)  Procedures .1-3 Lead EKG Interpretation  Performed by: Fatima Blank, MD Authorized by: Fatima Blank, MD     Interpretation: normal     ECG rate:  113   ECG rate assessment: tachycardic     Rhythm: sinus tachycardia     Ectopy: none     Conduction: normal   .Critical Care  Performed by: Fatima Blank, MD Authorized by: Fatima Blank, MD   Critical care provider statement:    Critical care time (minutes):  82   Critical care time was exclusive of:  Separately billable procedures and treating other patients   Critical care was necessary to treat or prevent imminent or life-threatening deterioration of the following conditions:  Circulatory failure, cardiac failure and respiratory failure   Critical care was time spent personally by me on the following activities:  Development of treatment plan with patient or surrogate, discussions with consultants, evaluation of patient's response to treatment, examination of patient, obtaining history from patient or surrogate, review of old charts, re-evaluation of patient's condition, pulse oximetry, ordering and review of radiographic studies, ordering and review of laboratory studies and ordering and performing treatments and interventions   Care discussed with: admitting provider     (including critical care time)  Medical Decision Making / ED Course   Medical Decision Making Amount and/or Complexity of Data Reviewed Labs: ordered. Decision-making details documented in ED Course. Radiology: ordered and independent interpretation performed. Decision-making  details documented in ED Course. ECG/medicine tests: ordered and independent interpretation performed. Decision-making details documented in ED Course.  Risk Prescription drug management. Drug therapy requiring intensive monitoring for toxicity. Decision regarding hospitalization.    Respiratory distress in the setting of hypertension requiring CPAP. Patient transition to BiPAP immediately. Started on nitroglycerin drip at 75 mics per minute.  Presentation is most concerning for hypertensive emergency given her uncontrolled hypertension. Will assess for pneumothorax, pneumonia.  Given patient's smoking history we will also obtain an ABG to assess for any hypercarbia.  Will check labs for any evidence of severe anemia, metabolic derangements or electrolyte derangements.  Chest x-ray notable for mild cardiomegaly with pulmonary edema EKG with sinus tachycardia.  Diffuse rate dependent changes.  Nonfocal ischemic changes noted. CBC without leukocytosis or anemia. ABG with respiratory acidosis. Metabolic panel without significant electrolyte derangements or renal insufficiency.  Hyperglycemia without DKA.  BNP>1500, but she does not look volume overloaded on exam Trop negative  2:27 AM On reassessment, patient's work of breathing has significantly improved.  Able to provide additional history noted above. Blood pressure down to 160s to 170s on the 75 mics of nitroglycerin.  0300: Titrating NTG down. WOB significantly improved.  89: Spoke with Dr. Bridgett Larsson from Hospitalist service agreed to admit for further w/u and management. Morning team to see for admission.  4:12 AM Weaned off of NTG. Weaning BiPAP.  5:32 AM Off of BiPAP and now on 2 L nasal cannula.  Blood pressures 150/80s.       Final Clinical Impression(s) / ED Diagnoses Final diagnoses:  Hypertensive emergency  Acute respiratory failure with hypoxia and hypercapnia (HCC)  Respiratory acidosis           This  chart was dictated using voice recognition software.  Despite best efforts to proofread,  errors can occur which can change the documentation meaning.    Fatima Blank, MD 11/21/22 [phone]

## 2022-11-21 NOTE — ED Notes (Signed)
CBG 119 

## 2022-11-21 NOTE — ED Notes (Signed)
New purewick placed by this NT due to soilage.

## 2022-11-21 NOTE — ED Notes (Signed)
ED provider at bedside.

## 2022-11-21 NOTE — Progress Notes (Signed)
OT Cancellation Note  Patient Details Name: Amy Howell MRN: [id number] DOB: [birth year]   Cancelled Treatment:    Reason Eval/Treat Not Completed: OT screened, no needs identified, will sign off Discussed with RN. Pt ambulatory to/from bathroom, steady and without safety concerns. Appears at or near baseline. Respiratory status has also improved from initial presentation. No formal OT eval needed at this time. Please reconsult if needs change.  Layla Maw 11/21/2022, 1:10 PM

## 2022-11-22 ENCOUNTER — Observation Stay (HOSPITAL_COMMUNITY): Payer: Medicare (Managed Care)

## 2022-11-22 ENCOUNTER — Other Ambulatory Visit (HOSPITAL_COMMUNITY): Payer: Self-pay

## 2022-11-22 ENCOUNTER — Encounter (HOSPITAL_COMMUNITY): Payer: Self-pay | Admitting: Internal Medicine

## 2022-11-22 DIAGNOSIS — I5021 Acute systolic (congestive) heart failure: Secondary | ICD-10-CM | POA: Diagnosis not present

## 2022-11-22 DIAGNOSIS — F17210 Nicotine dependence, cigarettes, uncomplicated: Secondary | ICD-10-CM | POA: Diagnosis present

## 2022-11-22 DIAGNOSIS — Z91014 Allergy to mammalian meats: Secondary | ICD-10-CM | POA: Diagnosis not present

## 2022-11-22 DIAGNOSIS — I2510 Atherosclerotic heart disease of native coronary artery without angina pectoris: Secondary | ICD-10-CM | POA: Diagnosis not present

## 2022-11-22 DIAGNOSIS — I5022 Chronic systolic (congestive) heart failure: Secondary | ICD-10-CM | POA: Insufficient documentation

## 2022-11-22 DIAGNOSIS — C50319 Malignant neoplasm of lower-inner quadrant of unspecified female breast: Secondary | ICD-10-CM | POA: Diagnosis present

## 2022-11-22 DIAGNOSIS — I2584 Coronary atherosclerosis due to calcified coronary lesion: Secondary | ICD-10-CM

## 2022-11-22 DIAGNOSIS — H409 Unspecified glaucoma: Secondary | ICD-10-CM | POA: Diagnosis present

## 2022-11-22 DIAGNOSIS — I10 Essential (primary) hypertension: Secondary | ICD-10-CM | POA: Diagnosis present

## 2022-11-22 DIAGNOSIS — Z95828 Presence of other vascular implants and grafts: Secondary | ICD-10-CM | POA: Diagnosis not present

## 2022-11-22 DIAGNOSIS — I161 Hypertensive emergency: Secondary | ICD-10-CM | POA: Diagnosis not present

## 2022-11-22 DIAGNOSIS — I5020 Unspecified systolic (congestive) heart failure: Secondary | ICD-10-CM | POA: Diagnosis not present

## 2022-11-22 DIAGNOSIS — E119 Type 2 diabetes mellitus without complications: Secondary | ICD-10-CM | POA: Diagnosis present

## 2022-11-22 DIAGNOSIS — Z8673 Personal history of transient ischemic attack (TIA), and cerebral infarction without residual deficits: Secondary | ICD-10-CM | POA: Diagnosis not present

## 2022-11-22 DIAGNOSIS — Z91148 Patient's other noncompliance with medication regimen for other reason: Secondary | ICD-10-CM | POA: Diagnosis not present

## 2022-11-22 DIAGNOSIS — Z79899 Other long term (current) drug therapy: Secondary | ICD-10-CM | POA: Diagnosis not present

## 2022-11-22 DIAGNOSIS — Z888 Allergy status to other drugs, medicaments and biological substances status: Secondary | ICD-10-CM | POA: Diagnosis not present

## 2022-11-22 DIAGNOSIS — J9601 Acute respiratory failure with hypoxia: Secondary | ICD-10-CM | POA: Diagnosis present

## 2022-11-22 DIAGNOSIS — Z66 Do not resuscitate: Secondary | ICD-10-CM | POA: Diagnosis present

## 2022-11-22 DIAGNOSIS — Z9011 Acquired absence of right breast and nipple: Secondary | ICD-10-CM | POA: Diagnosis not present

## 2022-11-22 DIAGNOSIS — Z1152 Encounter for screening for COVID-19: Secondary | ICD-10-CM | POA: Diagnosis not present

## 2022-11-22 DIAGNOSIS — I5043 Acute on chronic combined systolic (congestive) and diastolic (congestive) heart failure: Secondary | ICD-10-CM | POA: Diagnosis present

## 2022-11-22 DIAGNOSIS — I428 Other cardiomyopathies: Secondary | ICD-10-CM | POA: Diagnosis not present

## 2022-11-22 DIAGNOSIS — Z91013 Allergy to seafood: Secondary | ICD-10-CM | POA: Diagnosis not present

## 2022-11-22 DIAGNOSIS — I214 Non-ST elevation (NSTEMI) myocardial infarction: Secondary | ICD-10-CM | POA: Diagnosis present

## 2022-11-22 DIAGNOSIS — J9602 Acute respiratory failure with hypercapnia: Secondary | ICD-10-CM | POA: Diagnosis present

## 2022-11-22 DIAGNOSIS — F03A0 Unspecified dementia, mild, without behavioral disturbance, psychotic disturbance, mood disturbance, and anxiety: Secondary | ICD-10-CM | POA: Diagnosis present

## 2022-11-22 DIAGNOSIS — Z91011 Allergy to milk products: Secondary | ICD-10-CM | POA: Diagnosis not present

## 2022-11-22 DIAGNOSIS — N179 Acute kidney failure, unspecified: Secondary | ICD-10-CM | POA: Diagnosis present

## 2022-11-22 DIAGNOSIS — I429 Cardiomyopathy, unspecified: Secondary | ICD-10-CM | POA: Diagnosis present

## 2022-11-22 DIAGNOSIS — I110 Hypertensive heart disease with heart failure: Secondary | ICD-10-CM | POA: Diagnosis present

## 2022-11-22 HISTORY — DX: Hypertensive emergency: I16.1

## 2022-11-22 LAB — CBC
HCT: 35.1 % — ABNORMAL LOW (ref 36.0–46.0)
Hemoglobin: 11.6 g/dL — ABNORMAL LOW (ref 12.0–15.0)
MCH: 30.4 pg (ref 26.0–34.0)
MCHC: 33 g/dL (ref 30.0–36.0)
MCV: 91.9 fL (ref 80.0–100.0)
Platelets: 152 10*3/uL (ref 150–400)
RBC: 3.82 MIL/uL — ABNORMAL LOW (ref 3.87–5.11)
RDW: 13.6 % (ref 11.5–15.5)
WBC: 6.3 10*3/uL (ref 4.0–10.5)
nRBC: 0 % (ref 0.0–0.2)

## 2022-11-22 LAB — GLUCOSE, CAPILLARY: Glucose-Capillary: 84 mg/dL (ref 70–99)

## 2022-11-22 LAB — TSH: TSH: 0.506 u[IU]/mL (ref 0.350–4.500)

## 2022-11-22 LAB — ECHOCARDIOGRAM COMPLETE
AR max vel: 2.71 cm2
AV Area VTI: 2.51 cm2
AV Area mean vel: 2.45 cm2
AV Mean grad: 5 mmHg
AV Peak grad: 9.7 mmHg
Ao pk vel: 1.56 m/s
Area-P 1/2: 4.04 cm2
Height: 61 in
P 1/2 time: 490 msec
S' Lateral: 3.7 cm
Weight: 1600.01 oz

## 2022-11-22 LAB — BASIC METABOLIC PANEL
Anion gap: 11 (ref 5–15)
BUN: 21 mg/dL (ref 8–23)
CO2: 24 mmol/L (ref 22–32)
Calcium: 8.9 mg/dL (ref 8.9–10.3)
Chloride: 105 mmol/L (ref 98–111)
Creatinine, Ser: 1.17 mg/dL — ABNORMAL HIGH (ref 0.44–1.00)
GFR, Estimated: 51 mL/min — ABNORMAL LOW (ref >60)
Glucose, Bld: 92 mg/dL (ref 70–99)
Potassium: 3.7 mmol/L (ref 3.5–5.1)
Sodium: 140 mmol/L (ref 135–145)

## 2022-11-22 LAB — LIPID PANEL
Cholesterol: 194 mg/dL (ref 0–200)
HDL: 43 mg/dL (ref >40)
LDL Cholesterol: 135 mg/dL — ABNORMAL HIGH (ref 0–99)
Total CHOL/HDL Ratio: 4.5 RATIO
Triglycerides: 80 mg/dL (ref <150)
VLDL: 16 mg/dL (ref 0–40)

## 2022-11-22 LAB — HEMOGLOBIN A1C
Hgb A1c MFr Bld: 5.7 % — ABNORMAL HIGH (ref 4.8–5.6)
Mean Plasma Glucose: 117 mg/dL

## 2022-11-22 LAB — CBG MONITORING, ED: Glucose-Capillary: 138 mg/dL — ABNORMAL HIGH (ref 70–99)

## 2022-11-22 MED ORDER — ASPIRIN 81 MG PO TBEC
81.0000 mg | DELAYED_RELEASE_TABLET | Freq: Every day | ORAL | Status: DC
  Administered 2022-11-23 – 2022-11-25 (×3): 81 mg via ORAL
  Filled 2022-11-22 (×3): qty 1

## 2022-11-22 MED ORDER — METOPROLOL TARTRATE 100 MG PO TABS
100.0000 mg | ORAL_TABLET | Freq: Once | ORAL | Status: AC
  Administered 2022-11-23: 100 mg via ORAL
  Filled 2022-11-22: qty 1

## 2022-11-22 NOTE — Evaluation (Signed)
Physical Therapy Evaluation Patient Details Name: Amy Howell MRN: [id number] DOB: 10/05/1956 Today's Date: 11/22/2022  History of Present Illness  Pt is 66 yo female admitted with hypertensive emergency on 11/21/22.  Pt with hx of CVA, glaucoma, and HTN.  Clinical Impression  Pt admitted with above diagnosis.  She demonstrates safe mobility at independent level with VSS.  Pt has support if needed at home.  No further acute PT needs.        Recommendations for follow up therapy are one component of a multi-disciplinary discharge planning process, led by the attending physician.  Recommendations may be updated based on patient status, additional functional criteria and insurance authorization.  Follow Up Recommendations No PT follow up      Assistance Recommended at Discharge None  Patient can return home with the following       Equipment Recommendations None recommended by PT  Recommendations for Other Services       Functional Status Assessment Patient has not had a recent decline in their functional status     Precautions / Restrictions Precautions Precautions: None      Mobility  Bed Mobility Overal bed mobility: Independent                  Transfers Overall transfer level: Independent Equipment used: None Transfers: Sit to/from Stand Sit to Stand: Independent           General transfer comment: Had supervision as this was eval but demonstrated safely at independent level from bed and toilet.  Independent with toileting adls    Ambulation/Gait Ambulation/Gait assistance: Independent Gait Distance (Feet): 400 Feet Assistive device: None Gait Pattern/deviations: WFL(Within Functional Limits) Gait velocity: normal     General Gait Details: Supervision as eval but demonstrated safely at independent level  Stairs            Wheelchair Mobility    Modified Rankin (Stroke Patients Only)       Balance Overall balance assessment:  Independent   Sitting balance-Leahy Scale: Normal       Standing balance-Leahy Scale: Normal                 High Level Balance Comments: Pt turning, straightening bed, side steps, pretend jogging , and even danced a little without LOB             Pertinent Vitals/Pain Pain Assessment Pain Assessment: No/denies pain    Home Living Family/patient expects to be discharged to:: Private residence Living Arrangements: Children Available Help at Discharge: Family;Available 24 hours/day (dtr works from home) Type of Home: Apartment Home Access: Level entry       Home Layout: One [place]: Tub bench;Cane - single point      Prior Function Prior Level of Function : Independent/Modified Independent             Mobility Comments: Ambulated without AD ADLs Comments: independent with adls and iadls; does not drive     Hand Dominance        Extremity/Trunk Assessment   Upper Extremity Assessment Upper Extremity Assessment: Overall WFL for tasks assessed    Lower Extremity Assessment Lower Extremity Assessment: Overall WFL for tasks assessed    Cervical / Trunk Assessment Cervical / Trunk Assessment: Normal  Communication   Communication: No difficulties  Cognition Arousal/Alertness: Awake/alert Behavior During Therapy: WFL for tasks assessed/performed Overall Cognitive Status: Within Functional Limits for tasks assessed  General Comments General comments (skin integrity, edema, etc.): VSS    Exercises     Assessment/Plan    PT Assessment Patient does not need any further PT services  PT Problem List         PT Treatment Interventions      PT Goals (Current goals can be found in the Care Plan section)  Acute Rehab PT Goals Patient Stated Goal: return home PT Goal Formulation: All assessment and education complete, DC therapy    Frequency       Co-evaluation                AM-PAC PT "6 Clicks" Mobility  Outcome Measure Help needed turning from your back to your side while in a flat bed without using bedrails?: None Help needed moving from lying on your back to sitting on the side of a flat bed without using bedrails?: None Help needed moving to and from a bed to a chair (including a wheelchair)?: None Help needed standing up from a chair using your arms (e.g., wheelchair or bedside chair)?: None Help needed to walk in hospital room?: None Help needed climbing 3-5 steps with a railing? : None 6 Click Score: 24    End of Session   Activity Tolerance: Patient tolerated treatment well Patient left: in bed;with bed alarm set;with call bell/phone within reach Nurse Communication: Mobility status PT Visit Diagnosis: Other abnormalities of gait and mobility (R26.89)    Time: 3005-1102 PT Time Calculation (min) (ACUTE ONLY): 16 min   Charges:   PT Evaluation $PT Eval Low Complexity: 1 Low          Chelcea Zahn, PT Acute Rehab [facility] [phone]   Karlton Lemon 11/22/2022, 4:52 PM

## 2022-11-22 NOTE — Progress Notes (Signed)
   Heart Failure Stewardship Pharmacist Progress Note   PCP: Ladell Pier, MD PCP-Cardiologist: None    HPI:  66 yo M with PMH of CVA, glaucoma, T2DM, breast cancer s/p mastectomy, and HTN.  She presented to the ED on 12/21 with shortness of breath, orthopnea, and hypertensive emergency. CXR with cardiomegaly with central pulmonary vascular congestion, central interstitial and hazy opacities, likely pulmonary edema. ECHO 12/22 showed LVEF 30-35%, moderate LVH, G1DD, RV mildly reduced, trivial MR.   Current HF Medications: Diuretic: furosemide 40 mg IV BID  Prior to admission HF Medications: None  Pertinent Lab Values: Serum creatinine 1.17, BUN 21, Potassium 3.7, Sodium 140, BNP 1542.9, A1c 5.7   Vital Signs: Weight: 100 lbs Blood pressure: 150/80s  Heart rate: 60-80s   Medication Assistance / Insurance Benefits Check: Does the patient have prescription insurance?  Yes Type of insurance plan: [place] [employer]  Outpatient Pharmacy:  [facility] admission [facility] Is the patient willing to use [place] pharmacy at discharge? Yes    Assessment: 1. Acute systolic CHF (LVEF 37-90%), pending ischemic evaluation. NYHA class III symptoms. - Continue furosemide 40 mg IV BID. Strict I/Os and daily weights. Keep K>4 and check magnesium in AM. - Consider starting BB once euvolemic - Consider adding Entresto 24/26 mg BID and spironolactone 25 mg daily - Consider adding SGLT2i prior to discharge - Can transition off amlodipine to allow for aggressive titration of GDMT   Plan: 1) Medication changes recommended at this time: - Start Entresto 24/26 mg BID - Start spironolactone 25 mg daily  2) Patient assistance: - Jardiance/Farxiga copay $47 Delene Loll copay $47  3)  Education  - To be completed prior to discharge  Kerby Nora, PharmD, BCPS Heart Failure Cytogeneticist Phone [phone]

## 2022-11-22 NOTE — Assessment & Plan Note (Signed)
-   See hypertensive emergency -Blood pressure has improved - Due to allergies medication added carefully -Currently responding to beta-blockers and calcium channel blocker Norvasc

## 2022-11-22 NOTE — Assessment & Plan Note (Signed)
>>  ASSESSMENT AND PLAN FOR CRYPTOGENIC STROKE (HCC) WRITTEN ON 11/23/2022  2:28 PM BY Nevin Bloodgood A, MD  - Stable no new focal neurological findings - April 2018, MRI Brain: Subacute infarcts in the bilateral occipital poles. Small more acute infarcts may be superimposed. Patient has other small remote posterior circulation infarcts. 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 Corliss Skains - patient is compliant with asa and brilinta

## 2022-11-22 NOTE — Hospital Course (Signed)
[name] is a 66 yo female with PMH CVA, glaucoma, HTN who presented with shortness of breath.  She endorses difficulty with medications and not tolerating some medications therefore is hesitant on some of them at times.  She has not been taking blood pressure medication for this reason. She is compliant with her [name] which she says she has been on since 2018. On workup she was found to be in hypertensive crisis with blood pressure up to 188/112.  She was tachycardic and tachypneic.  She was placed on BiPAP initially due to work of breathing. Notable labs included increased creatinine (1.36; hard to know baseline given lack of labs in the past). BNP 1,542 and trop initially 8 which then trended upward (8 >> 37 >> 80).  Echo was ordered which showed reduced EF, 30 to 35% with wall motion abnormalities and grade 1 diastolic dysfunction.  Cardiology was consulted due to concern for underlying ischemia. She was also started on a nitroglycerin drip on admission and was transitioned to amlodipine after blood pressure responded.

## 2022-11-22 NOTE — Assessment & Plan Note (Signed)
-   Not on medications at home and patient has tried to control with diet and lifestyle changes - A1c 5.7%

## 2022-11-22 NOTE — Progress Notes (Signed)
Spoke to the CT coordinator nurse and also CT tech, will attempt to obtain coronary CT tomorrow.  I have ordered a single dose of metoprolol to tartrate to be given around 7 AM tomorrow morning.  Hopefully we can get the CT done around 10 or 11 AM tomorrow.  When CT tech contact me, I will obtain 2 tablets of nitroglycerin to give to the patient during the study.

## 2022-11-22 NOTE — Progress Notes (Signed)
Progress Note    Amy Howell   Amy Howell  DOB: [birth year]  DOA: 11/21/2022     0 PCP: Amy Pier, MD  Initial CC: SOB  Hospital Course: Amy Howell is a 65 yo female with PMH CVA, glaucoma, HTN who presented with shortness of breath.  She endorses difficulty with medications and not tolerating some medications therefore is hesitant on some of them at times.  She has not been taking blood pressure medication for this reason. She is compliant with her [name] which she says she has been on since 2018. On workup she was found to be in hypertensive crisis with blood pressure up to 188/112.  She was tachycardic and tachypneic.  She was placed on BiPAP initially due to work of breathing. Notable labs included increased creatinine (1.36; hard to know baseline given lack of labs in the past). BNP 1,542 and trop initially 8 which then trended upward (8 >> 37 >> 80).  Echo was ordered which showed reduced EF, 30 to 35% with wall motion abnormalities and grade 1 diastolic dysfunction.  Cardiology was consulted due to concern for underlying ischemia. She was also started on a nitroglycerin drip on admission and was transitioned to amlodipine after blood pressure responded.  Interval History:  Seen this afternoon in the ER.  Symptoms have improved after blood pressure has been treated.  Denies any chest pain.  Cardiology has been consulted.  Assessment and Plan: * Hypertensive emergency - BP 188/112; started on NTG gtt and now on amlodipine 10 mg daily but BP still above goal but is improved - also has creat noted at 1.36 on admission (baseline not clear yet) and elevated trop and BNP with echo showing WMA and reduced EF - hold off on further BP med changes until seen by cardiology as suspect GDMT will be initiated  -Patient states that she will be compliant with medications but has to find something that she tolerates; she seems to describe some swelling in her throat with a prior  medication, suspected ACEi  Acute on chronic combined systolic and diastolic CHF (congestive heart failure) ([place]) - likely has had some hypertensive CM underlying which became symptomatic and prompting patient to present; EKG abnormal and echo also now showing reduced EF and Gr 1 DD with [facility] - cardiology consulted in case of need for inpatient ischemic evaluation - GDMT likely to be initiated but patient does have ACEi allergy and also is particular on what meds she thinks she can tolerate but is open to trials of meds to see tolerance   Cryptogenic stroke [facility]) - April 2018, MRI Brain: Subacute infarcts in the bilateral occipital poles. Small more acute infarcts may be superimposed. Patient has other small remote posterior circulation infarcts. 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, right ([place]) - Underwent R breat mastectomy in 06/2022 with negative margins -She was given anastrozole but refused to take it after the first 3 doses made her somnolent -She is late for oncology f/u  Diabetes mellitus type 2 in nonobese [facility]) - Not on medications at home and patient has tried to control with diet and lifestyle changes - A1c 5.7%  Glaucoma of both eyes - Continue current eyedrops   Old records reviewed in assessment of this patient  Antimicrobials:   DVT prophylaxis:  enoxaparin (LOVENOX) injection 30 mg Start: 11/21/22 1000   Code Status:   Code Status: DNR  Mobility Assessment (last 72 hours)     Mobility Assessment     Row Name 11/21/22 03:34:26           Does patient have an order for bedrest or  is patient medically unstable No - Continue assessment       What is the highest level of mobility based on the progressive mobility assessment? Level 6 (Walks independently in room and hall) - Balance while walking in room without assist - Complete                Barriers to discharge:  Disposition Plan:  Home Status is: Inpt  Objective: Blood pressure (!) 154/82, pulse 82, temperature 97.6 F (36.4 C), temperature source Oral, resp. rate 14, height '5\' 1"'$  (1.549 m), weight 45.4 kg, SpO2 100 %.  Examination:  Physical Exam Constitutional:      General: She is not in acute distress.    Appearance: She is well-developed. She is not ill-appearing.  HENT:     Head: Normocephalic and atraumatic.     Mouth/Throat:     Mouth: Mucous membranes are moist.  Eyes:     Extraocular Movements: Extraocular movements intact.  Cardiovascular:     Rate and Rhythm: Normal rate and regular rhythm.  Pulmonary:     Effort: Pulmonary effort is normal.     Breath sounds: No wheezing.     Comments: Mild basilar crackles Abdominal:     General: Bowel sounds are normal. There is no distension.     Palpations: Abdomen is soft.     Tenderness: There is no abdominal tenderness.  Musculoskeletal:        General: No swelling. Normal range of motion.     Cervical back: Normal range of motion and neck supple.  Skin:    General: Skin is warm and dry.  Neurological:     General: No focal deficit present.     Mental Status: She is alert.  Psychiatric:        Mood and Affect: Mood normal.        Behavior: Behavior normal.      Consultants:  Cardiology  Procedures:    Data Reviewed: Results for orders placed or performed during the hospital encounter of 11/21/22 (from the past 24 hour(s))  CBG monitoring, ED     Status: None   Collection Time: 11/21/22  4:15 PM  Result Value Ref Range   Glucose-Capillary 95 70 - 99 mg/dL  CBG monitoring, ED     Status: Abnormal   Collection Time: 11/21/22   8:58 PM  Result Value Ref Range   Glucose-Capillary 107 (H) 70 - 99 mg/dL  Basic metabolic panel     Status: Abnormal   Collection Time: 11/22/22  6:09 AM  Result Value Ref Range   Sodium 140 135 - 145 mmol/L   Potassium 3.7 3.5 - 5.1 mmol/L   Chloride 105 98 - 111 mmol/L   CO2 24 22 - 32 mmol/L   Glucose, Bld 92 70 - 99 mg/dL   BUN 21 8 - 23 mg/dL   Creatinine, Ser 1.17 (H) 0.44 - 1.00 mg/dL   Calcium 8.9 8.9 - 10.3 mg/dL   GFR, Estimated 51 (L) >60 mL/min   Anion  gap 11 5 - 15  CBC     Status: Abnormal   Collection Time: 11/22/22  6:09 AM  Result Value Ref Range   WBC 6.3 4.0 - 10.5 K/uL   RBC 3.82 (L) 3.87 - 5.11 MIL/uL   Hemoglobin 11.6 (L) 12.0 - 15.0 g/dL   HCT 35.1 (L) 36.0 - 46.0 %   MCV 91.9 80.0 - 100.0 fL   MCH 30.4 26.0 - 34.0 pg   MCHC 33.0 30.0 - 36.0 g/dL   RDW 13.6 11.5 - 15.5 %   Platelets 152 150 - 400 K/uL   nRBC 0.0 0.0 - 0.2 %  CBG monitoring, ED     Status: Abnormal   Collection Time: 11/22/22  8:14 AM  Result Value Ref Range   Glucose-Capillary 138 (H) 70 - 99 mg/dL    I have Reviewed nursing notes, Vitals, and Lab results since pt's last encounter. Pertinent lab results : see above I have ordered labwork to follow up on.  I have reviewed the last note from staff over past 24 hours I have discussed pt's care plan and test results with nursing staff, CM/SW, and other staff as appropriate  Time spent: Greater than 50% of the 55 minute visit was spent in counseling/coordination of care for the patient as laid out in the A&P.   LOS: 0 days   Dwyane Dee, MD [facility] 11/22/2022, 1:56 PM

## 2022-11-22 NOTE — Progress Notes (Incomplete)
Echocardiogram 2D Echocardiogram has been performed.  Ronny Flurry 11/22/2022, 11:03 AM

## 2022-11-22 NOTE — Assessment & Plan Note (Signed)
-   Continue current eyedrops

## 2022-11-22 NOTE — Assessment & Plan Note (Addendum)
-  Hypertensive this a.m. as BP meds has been held - POA: BP 188/112; started on NTG gtt  >> off  -BP responsive to Norvasc, Coreg, losartan  -IV Lasix on hold  -With holding ACEi -due to history of allergy possibly angioedema

## 2022-11-22 NOTE — Assessment & Plan Note (Signed)
>>  ASSESSMENT AND PLAN FOR DIABETES MELLITUS TYPE 2 IN NONOBESE (HCC) WRITTEN ON 11/22/2022  1:48 PM BY PATSY, DAVID, MD  - Not on medications at home and patient has tried to control with diet and lifestyle changes - A1c 5.7%

## 2022-11-22 NOTE — Consult Note (Addendum)
Cardiology Consultation   Patient ID: Amy Howell MRN: [id number]; DOB: 04-28-1956  Admit date: 11/21/2022 Date of Consult: 11/22/2022  PCP:  Ladell Pier, MD   Amy Howell Cardiologist:  New to [facility]      Patient Profile:   Amy Howell is a 66 y.o. female with a hx of hypertension, DM 2, history of CVA and right breast cancer s/p mastectomy who is being seen 11/22/2022 for the evaluation of acute systolic heart failure and abnormal echocardiogram at the request of Dr. Sabino Gasser.  History of Present Illness:   Amy Howell is a 66 year old female with past medical history of hypertension, DM 2, history of CVA and right breast cancer s/p mastectomy.  She has no prior cardiac history.  She was seen initially in April 2018 in the setting of acute CVA.  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 Echocardiogram obtained during the same admission showed EF 65 to 70%, no regional wall motion abnormality.  TEE was negative for left atrial thrombus. A loop recorder was placed by Dr. Rayann Heman.  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  Due to discomfort over the device site, it was eventually removed on 02/01/2019.  She has a history of intolerances to ACE inhibitors that resulted in angioedema.  She was seen by her PCP for uncontrolled high blood pressure.  She was placed on amlodipine.  By the time she was followed up in May 2023, it was noted the patient has stopped amlodipine a month prior due to complaint of chest pain which she attributed to the side effect of amlodipine.  Blood pressure at the time of visit with PCP was 200/90.  She underwent a right mastectomy surgery by Dr. Georgette Dover on 06/25/2022.  She was in her usual state of health until the past 2 weeks.  She started having increased cough when laying down.  This prompted her to sleep at a elevated angle.  She also described white foamy phlegm with cough.   Yesterday, while drinking some water, she became severely short of breath.  She was transported to [facility] via EMS.  Blood pressure on arrival was above 200.  O2 saturation was 70s with the room air.  Creatinine on arrival was 1.36.  BNP 1542.9.  Serial troponin 8-->37-->80.  She denies any recent chest pain.  Prior to 2 weeks ago, she has been able to ambulate everywhere she wanted to without any exertional symptoms.  Chest x-ray obtained on 11/21/2022 showed cardiomegaly with central pulmonary vascular congestion consistent with pulmonary edema.  Patient was initially placed on nitroglycerin drip, this has eventually weaned off and switch to oral amlodipine 10 mg.  Systolic blood pressure is now down to the 140s.  He was also placed on 40 mg twice a day of IV Lasix with good urinary output.  Her breathing has significantly improved.  She is satting 100% on room air.  Echocardiogram obtained on 11/22/2022 showed EF 30 to 35%, global hypokinesis that is worse in the inferior and inferolateral wall from the base to the apex, grade 1 DD, mildly reduced RVEF, mild to moderate AI.  Cardiology service was consulted for acute systolic heart failure and abnormal echocardiogram.   Past Medical History:  Diagnosis Date   Cataracts, bilateral    Cerebral infarction due to carotid artery stenosis (HCC)    Diabetes mellitus type 2 in nonobese ([place])  Glaucoma    Hypertension    Stroke [facility])    no residual, "series of mini strokes"   Tobacco abuse    Vertigo     Past Surgical History:  Procedure Laterality Date   ABDOMINAL HYSTERECTOMY     CARDIAC DEFIBRILLATOR PLACEMENT     gum operation     Implantable loop recorder removal  02/01/2019   MDT Reveal LINQ explantation in office by JA   IR [place]  05/07/2017   IR ANGIO INTRA EXTRACRAN SEL COM CAROTID INNOMINATE BILAT MOD SED  04/23/2017   IR ANGIO INTRA EXTRACRAN SEL COM CAROTID INNOMINATE BILAT MOD SED  08/06/2017   IR ANGIO INTRA  EXTRACRAN SEL COM CAROTID INNOMINATE UNI R MOD SED  05/07/2017   IR ANGIO VERTEBRAL SEL VERTEBRAL BILAT MOD SED  04/23/2017   IR ANGIO VERTEBRAL SEL VERTEBRAL BILAT MOD SED  08/06/2017   IR ANGIOGRAM FOLLOW UP STUDY  05/07/2017   IR ANGIOGRAM SELECTIVE EACH ADDITIONAL VESSEL  05/07/2017   IR RADIOLOGIST EVAL & MGMT  04/16/2017   IR RADIOLOGIST EVAL & MGMT  05/29/2017   IR TRANSCATH/EMBOLIZ  05/07/2017   LOOP RECORDER INSERTION N/A 03/28/2017   Procedure: Loop Recorder Insertion;  Surgeon: Thompson Grayer, MD;  Location: [place] [facility];  Service: Cardiovascular;  Laterality: N/A;   MASTECTOMY W/ SENTINEL NODE BIOPSY Right 06/25/2022   Procedure: RIGHT MASTECTOMY WITH AXILLARY SENTINEL LYMPH NODE BIOPSY;  Surgeon: Donnie Mesa, MD;  Location: [place];  Service: General;  Laterality: Right;   RADIOLOGY WITH ANESTHESIA N/A 05/07/2017   Procedure: EMBOLIZATION;  Surgeon: Luanne Bras, MD;  Location: [place];  Service: Radiology;  Laterality: N/A;   RADIOLOGY WITH ANESTHESIA N/A 08/06/2017   Procedure: RADIOLOGY WITH ANESTHESIA STENTING;  Surgeon: Luanne Bras, MD;  Location: [place];  Service: Radiology;  Laterality: N/A;   TEE WITHOUT CARDIOVERSION N/A 03/28/2017   Procedure: TRANSESOPHAGEAL ECHOCARDIOGRAM (TEE);  Surgeon: Lelon Perla, MD;  Location: [facility] ENDOSCOPY;  Service: Cardiovascular;  Laterality: N/A;     Home Medications:  Prior to Admission medications   Medication Sig Start Date End Date Taking? Authorizing Provider  aspirin 325 MG tablet Take 1 tablet (325 mg total) by mouth daily. 06/30/17  Yes Hairston, Maylon Peppers, FNP  dorzolamide (TRUSOPT) 2 % ophthalmic solution Instill 1 drop into both eyes twice a day 02/19/22  Yes   feeding supplement, ENSURE ENLIVE, (ENSURE ENLIVE) LIQD Take 237 mLs by mouth daily.   Yes [provider]  latanoprost (XALATAN) 0.005 % ophthalmic solution Instill 1 drop into both eyes at bedtime 02/19/22  Yes   ticagrelor (BRILINTA) 90 MG  TABS tablet Take 0.5 tablets (45 mg total) by mouth 2 (two) times daily. 06/06/22  Yes Ladell Pier, MD  timolol (TIMOPTIC) 0.5 % ophthalmic solution Place 1 drop into both eyes in the morning. 07/10/22  Yes   triamcinolone cream (KENALOG) 0.1 % Apply 1 application topically 2 (two) times daily. Patient taking differently: Apply 1 application  topically daily as needed (for rash/itching). 12/06/21  Yes Ladell Pier, MD  amLODipine (NORVASC) 10 MG tablet Take 1 tablet (10 mg total) by mouth daily. Patient not taking: Reported on 11/21/2022 04/12/22   Ladell Pier, MD    Inpatient Medications: Scheduled Meds:  amLODipine  10 mg Oral Daily   aspirin  325 mg Oral Daily   dorzolamide  1 drop Both Eyes BID   enoxaparin (LOVENOX) injection  30 mg Subcutaneous Q24H  furosemide  40 mg Intravenous BID   insulin aspart  0-15 Units Subcutaneous TID WC   insulin aspart  0-5 Units Subcutaneous QHS   latanoprost  1 drop Both Eyes QHS   nicotine  14 mg Transdermal Daily   sodium chloride flush  3 mL Intravenous Q12H   ticagrelor  45 mg Oral BID   timolol  1 drop Both Eyes Daily   Continuous Infusions:  PRN Meds: acetaminophen **OR** acetaminophen, bisacodyl, hydrALAZINE, ondansetron **OR** ondansetron (ZOFRAN) IV, polyethylene glycol, traZODone  Allergies:    Allergies  Allergen Reactions   Lisinopril Other (See Comments)    Angioedema   Pork-Derived Products Diarrhea    Social History:   Social History   Socioeconomic History   Marital status: Single    Spouse name: Not on file   Number of children: Not on file   Years of education: Not on file   Highest education level: Not on file  Occupational History   Occupation: retired  Tobacco Use   Smoking status: Every Day    Packs/day: 0.50    Years: 56.00    Total pack years: 28.00    Types: Cigarettes   Smokeless tobacco: Never  Vaping Use   Vaping Use: Never used  Substance and Sexual Activity   Alcohol use: No    Drug use: No   Sexual activity: Not Currently    Birth control/protection: Surgical    Comment: Hyst  Other Topics Concern   Not on file  Social History Narrative   Not on file   Social Determinants of Health   Financial Resource Strain: Low Risk  (06/12/2022)   Overall Financial Resource Strain (CARDIA)    Difficulty of Paying Living Expenses: Not very hard  Food Insecurity: No Food Insecurity (06/12/2022)   Hunger Vital Sign    Worried About Running Out of Food in the Last Year: Never true    Ran Out of Food in the Last Year: Never true  Transportation Needs: No Transportation Needs (06/12/2022)   PRAPARE - Hydrologist (Medical): No    Lack of Transportation (Non-Medical): No  Physical Activity: Not on file  Stress: Not on file  Social Connections: Not on file  Intimate Partner Violence: Not on file    Family History:    Family History  Problem Relation Age of Onset   Dementia Mother    Kidney disease Father    Hypertension Father    Diabetes Father    Heart disease Father    Lung cancer Maternal Aunt        dx after 47   Prostate cancer Other        MGM's brother   Breast cancer Neg Hx      ROS:  Please see the history of present illness.   All other ROS reviewed and negative.     Physical Exam/Data:   Vitals:   11/22/22 1000 11/22/22 1200 11/22/22 1300 11/22/22 1317  BP: (!) 141/73 (!) 156/71 (!) 154/82   Pulse: 70 64 82   Resp: (!) 21  14   Temp:    97.6 F (36.4 C)  TempSrc:    Oral  SpO2: 100% 100% 100%   Weight:      Height:       No intake or output data in the 24 hours ending 11/22/22 1445    11/21/2022    4:24 AM 11/21/2022    3:19 AM 07/09/2022   11:49 AM  Last 3 Weights  Weight (lbs) 100 lb 100 lb 102 lb 6.4 oz  Weight (kg) 45.36 kg 45.36 kg 46.448 kg     Body mass index is 18.89 kg/m.  General:  Well nourished, well developed, in no acute distress HEENT: normal Neck: no JVD Vascular: No carotid bruits;  Distal pulses 2+ bilaterally Cardiac:  normal S1, S2; RRR; no murmur  Lungs:  clear to auscultation bilaterally, no wheezing, rhonchi or rales  Abd: soft, nontender, no hepatomegaly  Ext: no edema Musculoskeletal:  No deformities, BUE and BLE strength normal and equal Skin: warm and dry  Neuro:  CNs 2-12 intact, no focal abnormalities noted Psych:  Normal affect   EKG:  The EKG was personally reviewed and demonstrates: Normal sinus rhythm with LVH, T wave inversion in the lateral leads. Telemetry:  Telemetry was personally reviewed and demonstrates: Normal sinus rhythm, occasional small bursts of SVT.  Relevant CV Studies:  Echo 11/22/2022 1. Global hypokinesis; worse in the Inferior/inferolateral wall from base  to apex; GLS worse in the lateral wall; c/f possible left circumflex  disease. Left ventricular ejection fraction, by estimation, is 30 to 35%.  The left ventricle has moderately  decreased function. There is moderate asymmetric left ventricular  hypertrophy of the basal-septal segment. Left ventricular diastolic  parameters are consistent with Grade I diastolic dysfunction (impaired  relaxation).   2. Right ventricular systolic function is mildly reduced. The right  ventricular size is normal. Tricuspid regurgitation signal is inadequate  for assessing PA pressure.   3. Left atrial size was moderately dilated.   4. Trivial mitral valve regurgitation.   5. The aortic valve was not well visualized. Aortic valve regurgitation  is mild to moderate. Aortic valve sclerosis is present, with no evidence  of aortic valve stenosis.   6. The inferior vena cava is normal in size with greater than 50%  respiratory variability, suggesting right atrial pressure of 3 mmHg.   Conclusion(s)/Recommendation(s): Ef reported normal on prior TTE in 2018.  With WMA above, recommend an ischemic evaluation.    Laboratory Data:  High Sensitivity Troponin:   Recent Labs  Lab 11/21/22 0135  11/21/22 0416 11/21/22 0940 11/21/22 1228  TROPONINIHS 8 37* 80* 79*     Chemistry Recent Labs  Lab 11/21/22 0135 11/21/22 0142 11/21/22 0439 11/22/22 0609  NA 135 138  139 140 140  K 4.0 4.1  4.2 4.5 3.7  CL 105 103  --  105  CO2 17*  --   --  24  GLUCOSE 293* 289*  --  92  BUN 20 23  --  21  CREATININE 1.36* 1.20*  --  1.17*  CALCIUM 8.8*  --   --  8.9  GFRNONAA 42*  --   --  51*  ANIONGAP 13  --   --  11    Recent Labs  Lab 11/21/22 0135  PROT 7.5  ALBUMIN 3.5  AST 40  ALT 25  ALKPHOS 103  BILITOT 0.5   Lipids No results for input(s): "CHOL", "TRIG", "HDL", "LABVLDL", "LDLCALC", "CHOLHDL" in the last 168 hours.  Hematology Recent Labs  Lab 11/21/22 0135 11/21/22 0142 11/21/22 0439 11/22/22 0609  WBC 6.3  --   --  6.3  RBC 4.11  --   --  3.82*  HGB 12.2 13.9  14.3 11.9* 11.6*  HCT 41.5 41.0  42.0 35.0* 35.1*  MCV 101.0*  --   --  91.9  MCH 29.7  --   --  30.4  MCHC 29.4*  --   --  33.0  RDW 13.6  --   --  13.6  PLT 177  --   --  152   Thyroid No results for input(s): "TSH", "FREET4" in the last 168 hours.  BNP Recent Labs  Lab 11/21/22 0135  BNP 1,542.9*    DDimer No results for input(s): "DDIMER" in the last 168 hours.   Radiology/Studies:  ECHOCARDIOGRAM COMPLETE  Result Date: 11/22/2022    ECHOCARDIOGRAM REPORT   Patient Name:   Natoshia Souter Date of Exam: 11/22/2022 Medical Rec #:  [id number]     Height:       61.0 in Accession #:    [id number]    Weight:       100.0 lb Date of Birth:  [birth year]      BSA:          1.407 m Patient Age:    66 years      BP:           148/82 mmHg Patient Gender: F             HR:           71 bpm. Exam Location:  Inpatient Procedure: 2D Echo, Cardiac Doppler, Color Doppler and Strain Analysis Indications:    CHF-Acute Systolic 417.40/C14.48  History:        Patient has prior history of Echocardiogram examinations, most                 recent 03/27/2017. Pacemaker, Stroke; Risk Factors:Hypertension,                  Diabetes and Current Smoker. Breast Cancer.  Sonographer:    Ronny Flurry Referring Phys: Karmen Bongo IMPRESSIONS  1. Global hypokinesis; worse in the Inferior/inferolateral wall from base to apex; GLS worse in the lateral wall; c/f possible left circumflex disease. Left ventricular ejection fraction, by estimation, is 30 to 35%. The left ventricle has moderately decreased function. There is moderate asymmetric left ventricular hypertrophy of the basal-septal segment. Left ventricular diastolic parameters are consistent with Grade I diastolic dysfunction (impaired relaxation).  2. Right ventricular systolic function is mildly reduced. The right ventricular size is normal. Tricuspid regurgitation signal is inadequate for assessing PA pressure.  3. Left atrial size was moderately dilated.  4. Trivial mitral valve regurgitation.  5. The aortic valve was not well visualized. Aortic valve regurgitation is mild to moderate. Aortic valve sclerosis is present, with no evidence of aortic valve stenosis.  6. The inferior vena cava is normal in size with greater than 50% respiratory variability, suggesting right atrial pressure of 3 mmHg. Conclusion(s)/Recommendation(s): Ef reported normal on prior TTE in 2018. With WMA above, recommend an ischemic evaluation. FINDINGS  Left Ventricle: Global hypokinesis; worse in the Inferior/inferolateral wall from base to apex; GLS worse in the lateral wall; c/f possible left circumflex disease. Left ventricular ejection fraction, by estimation, is 30 to 35%. The left ventricle has moderately decreased function. The left ventricular internal cavity size was normal in size. There is moderate asymmetric left ventricular hypertrophy of the basal-septal segment. Left ventricular diastolic parameters are consistent with Grade I diastolic dysfunction (impaired relaxation). Right Ventricle: The right ventricular size is normal. Right ventricular systolic function is mildly reduced.  Tricuspid regurgitation signal is inadequate for assessing PA pressure. Left Atrium: Left atrial size was moderately dilated. Right Atrium: Right atrial size was normal in size. Pericardium: There is no evidence of pericardial effusion.  Mitral Valve: Trivial mitral valve regurgitation. Tricuspid Valve: Tricuspid valve regurgitation is not demonstrated. Aortic Valve: The aortic valve was not well visualized. Aortic valve regurgitation is mild to moderate. Aortic regurgitation PHT measures 490 msec. Aortic valve sclerosis is present, with no evidence of aortic valve stenosis. Aortic valve mean gradient measures 5.0 mmHg. Aortic valve peak gradient measures 9.7 mmHg. Aortic valve area, by VTI measures 2.51 cm. Pulmonic Valve: Pulmonic valve regurgitation is not visualized. Aorta: The aortic root and ascending aorta are structurally normal, with no evidence of dilitation. Venous: The inferior vena cava is normal in size with greater than 50% respiratory variability, suggesting right atrial pressure of 3 mmHg. IAS/Shunts: No atrial level shunt detected by color flow Doppler.  LEFT VENTRICLE PLAX 2D LVIDd:         4.40 cm LVIDs:         3.70 cm LV PW:         1.60 cm LV IVS:        1.40 cm LVOT diam:     2.00 cm   3D Volume EF: LV SV:         68        3D EF:        26 % LV SV Index:   48        LV EDV:       162 ml LVOT Area:     3.14 cm  LV ESV:       119 ml                          LV SV:        43 ml RIGHT VENTRICLE            IVC RV S prime:     8.05 cm/s  IVC diam: 1.20 cm TAPSE (M-mode): 1.2 cm LEFT ATRIUM             Index        RIGHT ATRIUM           Index LA diam:        2.40 cm 1.71 cm/m   RA Area:     10.90 cm LA Vol (A2C):   43.3 ml 30.78 ml/m  RA Volume:   26.50 ml  18.84 ml/m LA Vol (A4C):   48.2 ml 34.26 ml/m LA Biplane Vol: 46.3 ml 32.91 ml/m  AORTIC VALVE AV Area (Vmax):    2.71 cm AV Area (Vmean):   2.45 cm AV Area (VTI):     2.51 cm AV Vmax:           155.50 cm/s AV Vmean:          97.750  cm/s AV VTI:            0.270 m AV Peak Grad:      9.7 mmHg AV Mean Grad:      5.0 mmHg LVOT Vmax:         134.00 cm/s LVOT Vmean:        76.350 cm/s LVOT VTI:          0.216 m LVOT/AV VTI ratio: 0.80 AI PHT:            490 msec  AORTA Ao Root diam: 2.50 cm Ao Asc diam:  3.50 cm MITRAL VALVE MV Area (PHT): 4.04 cm    SHUNTS MV Decel Time: 188 msec    Systemic VTI:  0.22 m MV E velocity: 49.00 cm/s  Systemic Diam: 2.00 cm MV A velocity: 90.80 cm/s MV E/A ratio:  0.54 Mary Scientist, physiological signed by Phineas Inches Signature Date/Time: 11/22/2022/11:22:54 AM    Final    DG Chest Port 1 View  Result Date: 11/21/2022 CLINICAL DATA:  Shortness of breath EXAM: PORTABLE CHEST 1 VIEW COMPARISON:  None. FINDINGS: The heart is enlarged. There central pulmonary vascular congestion. There central interstitial and hazy opacities bilaterally. There is no focal lung consolidation or pneumothorax. No acute fractures are seen. IMPRESSION: 1. Cardiomegaly with central pulmonary vascular congestion. 2. Central interstitial and hazy opacities bilaterally, likely pulmonary edema. Electronically Signed   By: Ronney Asters M.D.   On: 11/21/2022 01:43     Assessment and Plan:   Acute heart failure  -White foamy productive cough, orthopnea and PND for the past 2 weeks.  -Echocardiogram showed EF 35% with wall motion abnormality.  Patient also had hypertensive emergency on arrival.  -Although EF could be low as result of uncontrolled high blood pressure, however will need left and right heart cath especially in the setting of abnormal wall motion seen on echocardiogram.  Will discuss with MD.  -Appears to be euvolemic on physical exam after IV diuresis.  Addendum: discussed with MD, since the patient ate, will consider coronary CT instead of cath  Abnormal echocardiogram: Previous echo in April 2018 showed EF 65 to 70%.  Repeat echocardiogram 11/22/2022 showed EF 30 to 35%, global hypokinesis worse in the inferior and  inferolateral wall, grade 1 DD, mild to moderate MR.  Will discuss with MD regarding left and right heart cath.  Hypertensive emergency: Patient was previously on amlodipine which she took herself off around April 2023 as she attributed chest pain as side effect of amlodipine.  She also has history of angioedema with ACE inhibitor.  Patient arrived with systolic blood pressure over 200.  She was placed on nitroglycerin drip which was eventually weaned to amlodipine 10 mg.  DM2  History of CVA in April 2018: TEE in 2018 was negative.  Subsequent loop recorder did not reveal atrial fibrillation.  Loop recorder was explanted in March 2020.  History of right breast cancer s/p right mastectomy.   Risk Assessment/Risk Scores:        New York Heart Association (NYHA) Functional Class NYHA Class IV        For questions or updates, please contact [place] Please consult [email or web address] for contact info under    Signed, Almyra Deforest, [place]  11/22/2022 2:45 PM  I have examined the patient and reviewed assessment and plan and discussed with patient.  Agree with above as stated.  New onset decrease in LVEF based on most recent echocardiogram.  She has been off of blood pressure medicines for some time.  She admits to not watching salt intake.  She has had a stent placed in her brain aneurysm.  She has been on a regimen of a full dose aspirin with Brilinta 45 mg p.o. twice daily.    I did explain to her that we would need to perform a cardiac workup.  She had eaten today.  Will plan for coronary CTA to evaluate for any high risk CAD.  She does not have angina.  I would only plan to keep her in the hospital for cardiac cath if she had high risk anatomy such as left main disease.  Otherwise, she could likely be managed medically.  Her drop in LVEF could be  due to uncontrolled blood pressure.  She is willing to start on medications that should help improve her LV function.  Unfortunately, due to  angioedema with lisinopril, her choices are somewhat limited.  Will try to incorporate beta-blocker.  She appears euvolemic at this time.  Could also try for spironolactone, isosorbide and hydralazine, SGLT2 inhibitor.  Amlodipine has been effective but she had perceived side effects sometime ago when on amlodipine which resolved when she stopped the medicine.  I also explained to her that if she needed a coronary stent, she would need to change her antiplatelet therapy regimen.  She denies ever having an intracranial hemorrhage.  Her weight is low but I am unaware of data that would support a 45 mg dose of Brilinta, certainly for a coronary stent.  If there was a concern about bleeding risk, could use clopidogrel.  Would also decrease aspirin dose to 81 mg daily.  [name]

## 2022-11-22 NOTE — Assessment & Plan Note (Signed)
-   Stable no new focal neurological findings - April 2018, MRI Brain: Subacute infarcts in the bilateral occipital poles. Small more acute infarcts may be superimposed. Patient has other small remote posterior circulation infarcts. 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 Estanislado Pandy - patient is compliant with asa and brilinta

## 2022-11-22 NOTE — Assessment & Plan Note (Addendum)
-   Stable denies any shortness of breath, chest pain -Elevated BNP 1542 >> 191.4 >>> 278.6 >>  -Chest x-ray positive for congestion, pulmonary edema -was on  IV Lasix 40 mg twice daily>>> discontinued 11/24/2022  - Echo 03/21/2017 LVEF 60-65%, -  Repeat Echo LVEF 30-35%, multiple WMAs , grade 1 diastolic dysfunction, mild RV dysfunction   Intake/Output Summary (Last 24 hours) at 11/26/2022 1412 Last data filed at 11/26/2022 0505 Gross per 24 hour  Intake 472 ml  Output --  Net 472 ml    - Cardiology following closely -Discontinue CT coronary due to AKI  - GDMT likely to be initiated but patient does have ACEi allergy -angioedema -Due to AKI, holding ARNI/Aldactone/SGLT2 inhibitor  -Per cardiology: Norvasc, carvedilol, and now losartan -Holding Lasix, diuretics, mildly overdiuresis, gentle IV fluid Improved kidney function  -Left/ Right cardiac Cath. 11/26/2022 per cardiology CHF/cardiomyopathy LVEF 30-35%-CAD

## 2022-11-22 NOTE — ED Notes (Signed)
Went into room to check pt's CBG and pt did not want to be stuck. Dr. Sabino Gasser at bedside and states we can omit the next CBG check and will probably change the frequency of blood sugar checks since pt's CBGs have been controlled.

## 2022-11-22 NOTE — Assessment & Plan Note (Signed)
>>  ASSESSMENT AND PLAN FOR ACUTE ON CHRONIC COMBINED SYSTOLIC AND DIASTOLIC CHF (CONGESTIVE HEART FAILURE) (HCC) WRITTEN ON 11/26/2022  2:14 PM BY SHAHMEHDI, SEYED A, MD  - Stable denies any shortness of breath, chest pain -Elevated BNP 1542 >> 191.4 >>> 278.6 >>  -Chest x-ray positive for congestion, pulmonary edema -was on  IV Lasix 40 mg twice daily>>> discontinued 11/24/2022  - Echo 03/21/2017 LVEF 60-65%, -  Repeat Echo LVEF 30-35%, multiple WMAs , grade 1 diastolic dysfunction, mild RV dysfunction   Intake/Output Summary (Last 24 hours) at 11/26/2022 1412 Last data filed at 11/26/2022 0505 Gross per 24 hour  Intake 472 ml  Output --  Net 472 ml    - Cardiology following closely -Discontinue CT coronary due to AKI  - GDMT likely to be initiated but patient does have ACEi allergy -angioedema -Due to AKI, holding ARNI/Aldactone/SGLT2 inhibitor  -Per cardiology: Norvasc, carvedilol, and now losartan -Holding Lasix, diuretics, mildly overdiuresis, gentle IV fluid Improved kidney function  -Left/ Right cardiac Cath. 11/26/2022 per cardiology CHF/cardiomyopathy LVEF 30-35%-CAD

## 2022-11-22 NOTE — ED Notes (Signed)
Called dietary and made note of pork and milk allergy and to avoid those products per pt request

## 2022-11-22 NOTE — Assessment & Plan Note (Signed)
-   Underwent R breat mastectomy in 06/2022 with negative margins -She was given anastrozole but refused to take it after the first 3 doses made her somnolent -She is late for oncology f/u

## 2022-11-23 DIAGNOSIS — I161 Hypertensive emergency: Secondary | ICD-10-CM | POA: Diagnosis not present

## 2022-11-23 DIAGNOSIS — I5021 Acute systolic (congestive) heart failure: Secondary | ICD-10-CM | POA: Diagnosis not present

## 2022-11-23 LAB — BASIC METABOLIC PANEL
Anion gap: 12 (ref 5–15)
BUN: 29 mg/dL — ABNORMAL HIGH (ref 8–23)
CO2: 26 mmol/L (ref 22–32)
Calcium: 9.2 mg/dL (ref 8.9–10.3)
Chloride: 99 mmol/L (ref 98–111)
Creatinine, Ser: 1.43 mg/dL — ABNORMAL HIGH (ref 0.44–1.00)
GFR, Estimated: 40 mL/min — ABNORMAL LOW (ref >60)
Glucose, Bld: 98 mg/dL (ref 70–99)
Potassium: 3.5 mmol/L (ref 3.5–5.1)
Sodium: 137 mmol/L (ref 135–145)

## 2022-11-23 LAB — CBC WITH DIFFERENTIAL/PLATELET
Abs Immature Granulocytes: 0.02 10*3/uL (ref 0.00–0.07)
Basophils Absolute: 0 10*3/uL (ref 0.0–0.1)
Basophils Relative: 1 %
Eosinophils Absolute: 0.4 10*3/uL (ref 0.0–0.5)
Eosinophils Relative: 7 %
HCT: 38.2 % (ref 36.0–46.0)
Hemoglobin: 12.2 g/dL (ref 12.0–15.0)
Immature Granulocytes: 0 %
Lymphocytes Relative: 23 %
Lymphs Abs: 1.2 10*3/uL (ref 0.7–4.0)
MCH: 29.7 pg (ref 26.0–34.0)
MCHC: 31.9 g/dL (ref 30.0–36.0)
MCV: 92.9 fL (ref 80.0–100.0)
Monocytes Absolute: 0.5 10*3/uL (ref 0.1–1.0)
Monocytes Relative: 9 %
Neutro Abs: 3.1 10*3/uL (ref 1.7–7.7)
Neutrophils Relative %: 60 %
Platelets: 162 10*3/uL (ref 150–400)
RBC: 4.11 MIL/uL (ref 3.87–5.11)
RDW: 13.4 % (ref 11.5–15.5)
WBC: 5.3 10*3/uL (ref 4.0–10.5)
nRBC: 0 % (ref 0.0–0.2)

## 2022-11-23 LAB — MAGNESIUM: Magnesium: 1.9 mg/dL (ref 1.7–2.4)

## 2022-11-23 MED ORDER — METOPROLOL TARTRATE 100 MG PO TABS
100.0000 mg | ORAL_TABLET | Freq: Once | ORAL | Status: AC
  Administered 2022-11-24: 100 mg via ORAL
  Filled 2022-11-23: qty 1

## 2022-11-23 MED ORDER — KATE FARMS STANDARD 1.4 PO LIQD
325.0000 mL | Freq: Two times a day (BID) | ORAL | Status: DC
  Administered 2022-11-25: 325 mL via ORAL
  Filled 2022-11-23 (×9): qty 325

## 2022-11-23 MED ORDER — ADULT MULTIVITAMIN W/MINERALS CH
1.0000 | ORAL_TABLET | Freq: Every day | ORAL | Status: DC
  Administered 2022-11-23 – 2022-11-27 (×4): 1 via ORAL
  Filled 2022-11-23 (×4): qty 1

## 2022-11-23 MED ORDER — FUROSEMIDE 10 MG/ML IJ SOLN: 40.0000 mg | Freq: Two times a day (BID) | INTRAMUSCULAR | Status: DC

## 2022-11-23 MED ORDER — METOPROLOL TARTRATE 50 MG PO TABS
50.0000 mg | ORAL_TABLET | Freq: Two times a day (BID) | ORAL | Status: AC
  Administered 2022-11-23 (×2): 50 mg via ORAL
  Filled 2022-11-23 (×2): qty 1

## 2022-11-23 MED ORDER — CARVEDILOL 6.25 MG PO TABS: 6.2500 mg | ORAL_TABLET | Freq: Two times a day (BID) | ORAL | Status: DC

## 2022-11-23 MED ORDER — NITROGLYCERIN 0.4 MG SL SUBL
SUBLINGUAL_TABLET | SUBLINGUAL | Status: AC
  Filled 2022-11-23: qty 2

## 2022-11-23 MED ORDER — METOPROLOL TARTRATE 100 MG PO TABS: 100.0000 mg | ORAL_TABLET | Freq: Once | ORAL | Status: DC

## 2022-11-23 NOTE — Progress Notes (Signed)
Initial Nutrition Assessment  DOCUMENTATION CODES:   Underweight  INTERVENTION:   -[employer] 1.4 po BID, each supplement provides 455 kcal and 20 grams protein.    -Multivitamin with minerals daily  NUTRITION DIAGNOSIS:   Increased nutrient needs related to chronic illness as evidenced by estimated needs.  GOAL:   Patient will meet greater than or equal to 90% of their needs  MONITOR:   PO intake, Supplement acceptance, Labs, Weight trends, I & O's  REASON FOR ASSESSMENT:   Consult Assessment of nutrition requirement/status  ASSESSMENT:   66 yo M with PMH of CVA, glaucoma, T2DM, breast cancer s/p mastectomy, and HTN.     She presented to the ED on 12/21 with shortness of breath, orthopnea, and hypertensive emergency.  Patient has been having SOB and cough at home for the past week. Pt reporting she made dietary changes to better manage her blood sugars.  Pt with multiple food allergies: beef, milk, pork, chicken, and fish. Limited protein sources available with diet. Will order vegan supplement [employer] for kcals and protein. Will also add daily MVI given limited diet.   Per weight records, pt has lost 8 lbs since 7/12 (7% wt loss x 5.5 months, insignificant for time frame).  Medications reviewed.  Labs reviewed: CBGs: 84-138   NUTRITION - FOCUSED PHYSICAL EXAM:  Unable to complete, working remotely.  Diet Order:   Diet Order             Diet NPO time specified  Diet effective midnight           Diet heart healthy/carb modified Room service appropriate? Yes; Fluid consistency: Thin; Fluid restriction: 1500 mL Fluid  Diet effective now                   EDUCATION NEEDS:   No education needs have been identified at this time  Skin:  Skin Assessment: Reviewed RN Assessment  Last BM:  12/22  Height:   Ht Readings from Last 1 Encounters:  11/21/22 '5\' 1"'$  (1.549 m)    Weight:   Wt Readings from Last 1 Encounters:  11/23/22 43.7 kg    BMI:   Body mass index is 18.21 kg/m.  Estimated Nutritional Needs:   Kcal:  1450-1650  Protein:  75-90g  Fluid:  1.6L/day  Clayton Bibles, MS, RD, LDN Inpatient Clinical Dietitian Contact information available via Amion

## 2022-11-23 NOTE — Progress Notes (Signed)
Pt states she is not diabetic, but "pre-diabetic" according to a Physician years ago.  She states she is cautious what she eats and has had no more problems with blood sugar.  Regular checks during this admission have resulted in the 100-125 range consistently.  Patient would like to discontinue finger stick glucose checks and discontinue insulin as a medication since she is not using it.

## 2022-11-23 NOTE — Progress Notes (Signed)
Rounding Note    Patient Name: Amy Howell Date of Encounter: 11/23/2022  [place] Cardiologist: New  Subjective   SOB has resolved  Inpatient Medications    Scheduled Meds:  amLODipine  10 mg Oral Daily   aspirin EC  81 mg Oral Daily   dorzolamide  1 drop Both Eyes BID   enoxaparin (LOVENOX) injection  30 mg Subcutaneous Q24H   [START ON 11/24/2022] furosemide  40 mg Intravenous BID   insulin aspart  0-15 Units Subcutaneous TID WC   insulin aspart  0-5 Units Subcutaneous QHS   latanoprost  1 drop Both Eyes QHS   metoprolol tartrate  50 mg Oral BID   nicotine  14 mg Transdermal Daily   nitroGLYCERIN       sodium chloride flush  3 mL Intravenous Q12H   ticagrelor  45 mg Oral BID   timolol  1 drop Both Eyes Daily   Continuous Infusions:  PRN Meds: acetaminophen **OR** acetaminophen, bisacodyl, hydrALAZINE, nitroGLYCERIN, ondansetron **OR** ondansetron (ZOFRAN) IV, polyethylene glycol, traZODone   Vital Signs    Vitals:   11/23/22 0300 11/23/22 0400 11/23/22 0448 11/23/22 0600  BP:   128/82   Pulse:   93 70  Resp: '16 19 19 20  '$ Temp:   98.1 F (36.7 C)   TempSrc:   Oral   SpO2:   98% 97%  Weight:   43.7 kg   Height:        Intake/Output Summary (Last 24 hours) at 11/23/2022 0758 Last data filed at 11/23/2022 0453 Gross per 24 hour  Intake --  Output 400 ml  Net -400 ml      11/23/2022    4:48 AM 11/21/2022    4:24 AM 11/21/2022    3:19 AM  Last 3 Weights  Weight (lbs) 96 lb 6.4 oz 100 lb 100 lb  Weight (kg) 43.727 kg 45.36 kg 45.36 kg      Telemetry    SR - Personally Reviewed  ECG    N/a - Personally Reviewed  Physical Exam   GEN: No acute distress.   Neck: No JVD Cardiac: RRR, no murmurs, rubs, or gallops.  Respiratory: Clear to auscultation bilaterally. GI: Soft, nontender, non-distended  MS: No edema; No deformity. Neuro:  Nonfocal  Psych: Normal affect   Labs    High Sensitivity Troponin:   Recent Labs  Lab  11/21/22 0135 11/21/22 0416 11/21/22 0940 11/21/22 1228  TROPONINIHS 8 37* 80* 79*     Chemistry Recent Labs  Lab 11/21/22 0135 11/21/22 0142 11/21/22 0439 11/22/22 0609 11/23/22 0057  NA 135 138  139 140 140 137  K 4.0 4.1  4.2 4.5 3.7 3.5  CL 105 103  --  105 99  CO2 17*  --   --  24 26  GLUCOSE 293* 289*  --  92 98  BUN 20 23  --  21 29*  CREATININE 1.36* 1.20*  --  1.17* 1.43*  CALCIUM 8.8*  --   --  8.9 9.2  MG  --   --   --   --  1.9  PROT 7.5  --   --   --   --   ALBUMIN 3.5  --   --   --   --   AST 40  --   --   --   --   ALT 25  --   --   --   --   ALKPHOS 103  --   --   --   --  BILITOT 0.5  --   --   --   --   GFRNONAA 42*  --   --  51* 40*  ANIONGAP 13  --   --  11 12    Lipids  Recent Labs  Lab 11/22/22 0609  CHOL 194  TRIG 80  HDL 43  LDLCALC 135*  CHOLHDL 4.5    Hematology Recent Labs  Lab 11/21/22 0135 11/21/22 0142 11/21/22 0439 11/22/22 0609 11/23/22 0057  WBC 6.3  --   --  6.3 5.3  RBC 4.11  --   --  3.82* 4.11  HGB 12.2   < > 11.9* 11.6* 12.2  HCT 41.5   < > 35.0* 35.1* 38.2  MCV 101.0*  --   --  91.9 92.9  MCH 29.7  --   --  30.4 29.7  MCHC 29.4*  --   --  33.0 31.9  RDW 13.6  --   --  13.6 13.4  PLT 177  --   --  152 162   < > = values in this interval not displayed.   Thyroid  Recent Labs  Lab 11/22/22 0609  TSH 0.506    BNP Recent Labs  Lab 11/21/22 0135  BNP 1,542.9*    DDimer No results for input(s): "DDIMER" in the last 168 hours.   Radiology    ECHOCARDIOGRAM COMPLETE  Result Date: 11/22/2022    ECHOCARDIOGRAM REPORT   Patient Name:   Amy Howell Date of Exam: 11/22/2022 Medical Rec #:  [id number]     Height:       61.0 in Accession #:    [id number]    Weight:       100.0 lb Date of Birth:  [birth year]      BSA:          1.407 m Patient Age:    66 years      BP:           148/82 mmHg Patient Gender: F             HR:           71 bpm. Exam Location:  Inpatient Procedure: 2D Echo, Cardiac Doppler, Color  Doppler and Strain Analysis Indications:    CHF-Acute Systolic 194.17/E08.14  History:        Patient has prior history of Echocardiogram examinations, most                 recent 03/27/2017. Pacemaker, Stroke; Risk Factors:Hypertension,                 Diabetes and Current Smoker. Breast Cancer.  Sonographer:    Ronny Flurry Referring Phys: Karmen Bongo IMPRESSIONS  1. Global hypokinesis; worse in the Inferior/inferolateral wall from base to apex; GLS worse in the lateral wall; c/f possible left circumflex disease. Left ventricular ejection fraction, by estimation, is 30 to 35%. The left ventricle has moderately decreased function. There is moderate asymmetric left ventricular hypertrophy of the basal-septal segment. Left ventricular diastolic parameters are consistent with Grade I diastolic dysfunction (impaired relaxation).  2. Right ventricular systolic function is mildly reduced. The right ventricular size is normal. Tricuspid regurgitation signal is inadequate for assessing PA pressure.  3. Left atrial size was moderately dilated.  4. Trivial mitral valve regurgitation.  5. The aortic valve was not well visualized. Aortic valve regurgitation is mild to moderate. Aortic valve sclerosis is present, with no evidence of aortic valve stenosis.  6. The inferior vena cava  is normal in size with greater than 50% respiratory variability, suggesting right atrial pressure of 3 mmHg. Conclusion(s)/Recommendation(s): Ef reported normal on prior TTE in 2018. With WMA above, recommend an ischemic evaluation. FINDINGS  Left Ventricle: Global hypokinesis; worse in the Inferior/inferolateral wall from base to apex; GLS worse in the lateral wall; c/f possible left circumflex disease. Left ventricular ejection fraction, by estimation, is 30 to 35%. The left ventricle has moderately decreased function. The left ventricular internal cavity size was normal in size. There is moderate asymmetric left ventricular hypertrophy of  the basal-septal segment. Left ventricular diastolic parameters are consistent with Grade I diastolic dysfunction (impaired relaxation). Right Ventricle: The right ventricular size is normal. Right ventricular systolic function is mildly reduced. Tricuspid regurgitation signal is inadequate for assessing PA pressure. Left Atrium: Left atrial size was moderately dilated. Right Atrium: Right atrial size was normal in size. Pericardium: There is no evidence of pericardial effusion. Mitral Valve: Trivial mitral valve regurgitation. Tricuspid Valve: Tricuspid valve regurgitation is not demonstrated. Aortic Valve: The aortic valve was not well visualized. Aortic valve regurgitation is mild to moderate. Aortic regurgitation PHT measures 490 msec. Aortic valve sclerosis is present, with no evidence of aortic valve stenosis. Aortic valve mean gradient measures 5.0 mmHg. Aortic valve peak gradient measures 9.7 mmHg. Aortic valve area, by VTI measures 2.51 cm. Pulmonic Valve: Pulmonic valve regurgitation is not visualized. Aorta: The aortic root and ascending aorta are structurally normal, with no evidence of dilitation. Venous: The inferior vena cava is normal in size with greater than 50% respiratory variability, suggesting right atrial pressure of 3 mmHg. IAS/Shunts: No atrial level shunt detected by color flow Doppler.  LEFT VENTRICLE PLAX 2D LVIDd:         4.40 cm LVIDs:         3.70 cm LV PW:         1.60 cm LV IVS:        1.40 cm LVOT diam:     2.00 cm   3D Volume EF: LV SV:         68        3D EF:        26 % LV SV Index:   48        LV EDV:       162 ml LVOT Area:     3.14 cm  LV ESV:       119 ml                          LV SV:        43 ml RIGHT VENTRICLE            IVC RV S prime:     8.05 cm/s  IVC diam: 1.20 cm TAPSE (M-mode): 1.2 cm LEFT ATRIUM             Index        RIGHT ATRIUM           Index LA diam:        2.40 cm 1.71 cm/m   RA Area:     10.90 cm LA Vol (A2C):   43.3 ml 30.78 ml/m  RA Volume:    26.50 ml  18.84 ml/m LA Vol (A4C):   48.2 ml 34.26 ml/m LA Biplane Vol: 46.3 ml 32.91 ml/m  AORTIC VALVE AV Area (Vmax):    2.71 cm AV Area (Vmean):   2.45 cm AV  Area (VTI):     2.51 cm AV Vmax:           155.50 cm/s AV Vmean:          97.750 cm/s AV VTI:            0.270 m AV Peak Grad:      9.7 mmHg AV Mean Grad:      5.0 mmHg LVOT Vmax:         134.00 cm/s LVOT Vmean:        76.350 cm/s LVOT VTI:          0.216 m LVOT/AV VTI ratio: 0.80 AI PHT:            490 msec  AORTA Ao Root diam: 2.50 cm Ao Asc diam:  3.50 cm MITRAL VALVE MV Area (PHT): 4.04 cm    SHUNTS MV Decel Time: 188 msec    Systemic VTI:  0.22 m MV E velocity: 49.00 cm/s  Systemic Diam: 2.00 cm MV A velocity: 90.80 cm/s MV E/A ratio:  0.54 Landscape architect signed by Phineas Inches Signature Date/Time: 11/22/2022/11:22:54 AM    Final     Cardiac Studies     Patient Profile     Amy Howell is a 66 y.o. female with a hx of hypertension, DM 2, history of CVA and right breast cancer s/p mastectomy who is being seen 11/22/2022 for the evaluation of acute systolic heart failure and abnormal echocardiogram at the request of Dr. Sabino Gasser.   Assessment & Plan    1.Acute HFrEF - 03/2017 echo LVEF 65-70% - echo this admit LVEF 30-35%, multiple WMAs, grade I dd, mild RV dysfunction -CXR central congestin, pulm edema, BNP 1542  I/Os incomplete, she is on IV lasix '40mg'$  bid. With rising Cr will d/c IV lasix particularly with schedule coronary CTA - change lopressor to coreg starting tomorrow since already received lopressor dose. With AKI hold on ACE/ARB/ARNI/aldactone/SGLT2i today. She has history of angioedema on ACEi, would also have to avoid ARNI could consider ARB.   - given drop in LVEF and and new WMAs plan from Dr Emily Filbert consult note is for coronary CTA today which is scheduled today.  -pending CTA may be possible d/c later today, can work on medication titration as outpatient.     2.Hypertensive emergency - bp's  improved on oral regimen, had not been taking meds at home  3.DM2   4. History of CVA in April 2018: TEE in 2018 was negative.  Subsequent loop recorder did not reveal atrial fibrillation.  Loop recorder was explanted in March 2020.   5. History of right breast cancer s/p right mastectomy.   6. History of cerebral stenting - she is on ASA 81, brillinta '45mg'$  bid  For questions or updates, please contact [place] Please consult [email or web address] for contact info under        Signed, Carlyle Dolly, MD  11/23/2022, 7:58 AM

## 2022-11-23 NOTE — Progress Notes (Signed)
PROGRESS NOTE    Patient: Amy Howell                            PCP: Ladell Pier, MD                    DOB: 1956-05-25            DOA: 11/21/2022 [id number]             DOS: 11/23/2022, 2:34 PM   LOS: 1 day   Date of Service: The patient was seen and examined on 11/23/2022  Subjective:   The patient was seen and examined this morning. Stable at this time. Still complaining of :  Otherwise no issues overnight .  Brief Narrative:   Amy Howell is a 66 yo female with PMH CVA, glaucoma, HTN who presented with shortness of breath.  She endorses difficulty with medications and not tolerating some medications therefore is hesitant on some of them at times.  She has not been taking blood pressure medication for this reason. She is compliant with her [name] which she says she has been on since 2018. On workup she was found to be in hypertensive crisis with blood pressure up to 188/112.  She was tachycardic and tachypneic.  She was placed on BiPAP initially due to work of breathing. Notable labs included increased creatinine (1.36; hard to know baseline given lack of labs in the past). BNP 1,542 and trop initially 8 which then trended upward (8 >> 37 >> 80).  Echo was ordered which showed reduced EF, 30 to 35% with wall motion abnormalities and grade 1 diastolic dysfunction.  Cardiology was consulted due to concern for underlying ischemia. She was also started on a nitroglycerin drip on admission and was transitioned to amlodipine after blood pressure responded.      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, right (Amy Howell)     Assessment and Plan: *  Hypertensive emergency - BP improved  - POA: BP 188/112; started on NTG gtt and now on amlodipine 10 mg daily but BP still above goal but is improved - also has creat noted at 1.36 on admission (baseline not clear yet) and elevated trop and BNP with echo showing WMA and reduced EF ACEi -BP has stabilized, off nitroglycerin drip -Continue Norvasc and beta-blockers (Received metoprolol today, will be switched to Coreg in a.m.) On Lasix 40 mg IV twice daily  -With holding ACEi -due to history of allergy possibly angioedema We will holding ACE/ARB/ARNI/Aldactone due to elevated creatinine  Lab Results  Component Value Date   CREATININE 1.43 (H) 11/23/2022   CREATININE 1.17 (H) 11/22/2022   CREATININE 1.20 (H) 11/21/2022     Acute on chronic combined systolic and diastolic CHF (congestive heart failure) (HCC) - Currently stable, shortness of breath with minimal exertion -Elevated BNP 1542 >> -Chest x-ray positive for congestion, pulmonary edema -On IV Lasix 40 mg twice daily  - Echo 03/21/2017 LVEF 60-65%, -  Repeat Echo LVEF 30-35%, multiple WMAs , grade 1 diastolic dysfunction, mild RV dysfunction   - Cardiology following closely -Pending CTA coronary-  -  GDMT likely to be initiated but patient does have ACEi allergy -angioedema -Due to AKI, holding ARNI/Aldactone/SGLT2 inhibitor  -Per cardiology: Norvasc, carvedilol  Cryptogenic stroke (Amy Howell) - Stable no new focal neurological findings - April 2018, MRI Brain: Subacute infarcts in the bilateral occipital poles. Small more acute infarcts may be superimposed. Patient has other small remote posterior circulation infarcts. 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, right (Amy Howell) - Underwent R breat mastectomy in 06/2022 with negative margins -She was given anastrozole but refused to take it after the first 3 doses made her somnolent -She is late for oncology f/u  Diabetes mellitus type 2 in nonobese (Amy Howell) - Not on medications at home and patient has tried to control with diet and lifestyle changes - A1c 5.7%  Glaucoma of both eyes - Continue current eyedrops   -------------------------------------------------------------------------------------------------------------------------------------- Nutritional status:  The patient's BMI is: Body mass index is 18.21 kg/m. I agree with the assessment and plan as outlined   DVT prophylaxis:  enoxaparin (LOVENOX) injection 30 mg Start: 11/21/22 1000   Code Status:   Code Status: DNR  Family Communication: Daughter at bedside-updated The above findings and plan of care has been discussed with patient (and family)  in detail,  they expressed understanding and agreement of above. -Advance care planning has been discussed.   Disposition:  from home anticipating discharging home in a.m. Pending CTA coronary, titration of current medication and monitoring creatinine  Admission status:   Status is: Inpatient Remains inpatient appropriate because: Titrating medication, cardiology evaluation, pending CTA coronary angiogram     Procedures:   No admission procedures for hospital encounter.   Antimicrobials:  Anti-infectives (From admission, onward)    None        Medication:   amLODipine  10 mg Oral Daily   aspirin EC  81 mg Oral Daily   dorzolamide  1 drop Both Eyes BID   enoxaparin (LOVENOX) injection  30 mg Subcutaneous Q24H   feeding supplement (KATE FARMS STANDARD 1.4)  325 mL Oral BID  BM   insulin aspart  0-15 Units Subcutaneous TID WC   insulin aspart  0-5 Units Subcutaneous QHS   latanoprost  1 drop Both Eyes QHS   [START ON 11/24/2022] metoprolol tartrate  100 mg Oral Once   metoprolol tartrate  50 mg Oral BID   multivitamin with minerals  1 tablet Oral Daily   nicotine  14 mg Transdermal Daily   nitroGLYCERIN       sodium chloride flush  3 mL Intravenous Q12H   ticagrelor  45 mg Oral BID   timolol  1 drop Both Eyes Daily    acetaminophen **OR** acetaminophen, bisacodyl, hydrALAZINE, nitroGLYCERIN, ondansetron **OR** ondansetron (ZOFRAN) IV, polyethylene glycol, traZODone   Objective:   Vitals:   11/23/22 0400 11/23/22 0448 11/23/22 0600 11/23/22 0855  BP:  128/82  (!) 115/54  Pulse:  93 70 67  Resp: '19 19 20 17  '$ Temp:  98.1 F (36.7 C)  97.8 F (36.6 C)  TempSrc:  Oral  Oral  SpO2:  98% 97% 96%  Weight:  43.7  kg    Height:        Intake/Output Summary (Last 24 hours) at 11/23/2022 1434 Last data filed at 11/23/2022 0453 Gross per 24 hour  Intake --  Output 400 ml  Net -400 ml   Filed Weights   11/21/22 0319 11/21/22 0424 11/23/22 0448  Weight: 45.4 kg 45.4 kg 43.7 kg     Examination:   Physical Exam  Constitution:  Alert, cooperative, no distress,  Appears calm and comfortable  Psychiatric:   Normal and stable mood and affect, cognition intact,   HEENT:        Normocephalic, PERRL, otherwise with in Normal limits  Chest:         Chest symmetric Cardio vascular:  S1/S2, RRR, No murmure, No Rubs or Gallops  pulmonary: Clear to auscultation bilaterally, respirations unlabored, negative wheezes / crackles Abdomen: Soft, non-tender, non-distended, bowel sounds,no masses, no organomegaly Muscular skeletal: Limited exam - in bed, able to move all 4 extremities,   Neuro: CNII-XII intact. , normal motor and sensation, reflexes intact  Extremities: No pitting edema lower extremities, +2 pulses  Skin: Dry, warm to touch, negative for any Rashes,  No open wounds Wounds: per nursing documentation   ------------------------------------------------------------------------------------------------------------------------------------------    LABs:     Latest Ref Rng & Units 11/23/2022   12:57 AM 11/22/2022    6:09 AM 11/21/2022    4:39 AM  CBC  WBC 4.0 - 10.5 K/uL 5.3  6.3    Hemoglobin 12.0 - 15.0 g/dL 12.2  11.6  11.9   Hematocrit 36.0 - 46.0 % 38.2  35.1  35.0   Platelets 150 - 400 K/uL 162  152        Latest Ref Rng & Units 11/23/2022   12:57 AM 11/22/2022    6:09 AM 11/21/2022    4:39 AM  CMP  Glucose 70 - 99 mg/dL 98  92    BUN 8 - 23 mg/dL 29  21    Creatinine 0.44 - 1.00 mg/dL 1.43  1.17    Sodium 135 - 145 mmol/L 137  140  140   Potassium 3.5 - 5.1 mmol/L 3.5  3.7  4.5   Chloride 98 - 111 mmol/L 99  105    CO2 22 - 32 mmol/L 26  24    Calcium 8.9 - 10.3 mg/dL 9.2  8.9         Micro Results Recent Results (from the past 240 hour(s))  Resp panel by RT-PCR (RSV, Flu A&B, Covid) Anterior Nasal Swab     Status: None   Collection Time: 11/21/22  2:16 AM   Specimen: Anterior Nasal Swab  Result Value Ref Range Status   SARS Coronavirus 2 by RT PCR NEGATIVE NEGATIVE Final    Comment: (NOTE) SARS-CoV-2 target nucleic acids are NOT DETECTED.  The SARS-CoV-2 RNA is generally detectable in upper respiratory specimens during the acute phase of infection. The lowest concentration of SARS-CoV-2 viral copies this assay can detect is 138 copies/mL. A negative result does not preclude SARS-Cov-2 infection and should not be used as the sole basis for treatment or other patient management decisions. A negative result may occur with  improper specimen collection/handling, submission of specimen other than nasopharyngeal swab, presence of viral mutation(s) within the areas targeted by this assay, and inadequate number of viral copies(<138 copies/mL). A negative result must be combined with clinical observations, patient  history, and epidemiological information. The expected result is Negative.  Fact Sheet for Patients:  EntrepreneurPulse.com.au  Fact Sheet for Healthcare Providers:  IncredibleEmployment.be  This test is no t yet approved or cleared by the [place] FDA and  has been authorized for detection and/or diagnosis of SARS-CoV-2 by FDA under an Emergency Use Authorization (EUA). This EUA will remain  in effect (meaning this test can be used) for the duration of the COVID-19 declaration under Section 564(b)(1) of the Act, 21 U.S.C.section 360bbb-3(b)(1), unless the authorization is terminated  or revoked sooner.       Influenza A by PCR NEGATIVE NEGATIVE Final   Influenza B by PCR NEGATIVE NEGATIVE Final    Comment: (NOTE) The Xpert Xpress SARS-CoV-2/FLU/RSV plus assay is intended as an aid in the diagnosis of influenza from Nasopharyngeal swab specimens and should not be used as a sole basis for treatment. Nasal washings and aspirates are unacceptable for Xpert Xpress SARS-CoV-2/FLU/RSV testing.  Fact Sheet for Patients: EntrepreneurPulse.com.au  Fact Sheet for Healthcare Providers: IncredibleEmployment.be  This test is not yet approved or cleared by the [place] FDA and has been authorized for detection and/or diagnosis of SARS-CoV-2 by FDA under an Emergency Use Authorization (EUA). This EUA will remain in effect (meaning this test can be used) for the duration of the COVID-19 declaration under Section 564(b)(1) of the Act, 21 U.S.C. section 360bbb-3(b)(1), unless the authorization is terminated or revoked.     Resp Syncytial Virus by PCR NEGATIVE NEGATIVE Final    Comment: (NOTE) Fact Sheet for Patients: EntrepreneurPulse.com.au  Fact Sheet for Healthcare Providers: IncredibleEmployment.be  This test is not yet approved or cleared by the [place] FDA  and has been authorized for detection and/or diagnosis of SARS-CoV-2 by FDA under an Emergency Use Authorization (EUA). This EUA will remain in effect (meaning this test can be used) for the duration of the COVID-19 declaration under Section 564(b)(1) of the Act, 21 U.S.C. section 360bbb-3(b)(1), unless the authorization is terminated or revoked.  Performed at [facility], [place] [address], [place], [place] [address]     Radiology Reports No results found.  SIGNED: Deatra James, MD, FHM. [facility],  Pager (please use [email or web address] to page/text) Please use Epic Secure Chat for non-urgent communication (7AM-7PM)  If 7PM-7AM, please contact night-coverage [email or web address], 11/23/2022, 2:34 PM

## 2022-11-23 NOTE — Progress Notes (Signed)
   11/23/22 0855  Assess: MEWS Score  Temp 97.8 F (36.6 C)  BP (!) 115/54  MAP (mmHg) 71  Pulse Rate 67  ECG Heart Rate (!) 14  Resp 17  Level of Consciousness Alert  SpO2 96 %  O2 Device Room Air  Assess: if the MEWS score is Yellow or Red  Were vital signs taken at a resting state? Yes  Focused Assessment No change from prior assessment  Does the patient meet 2 or more of the SIRS criteria? No  Does the patient have a confirmed or suspected source of infection? No  Provider and Rapid Response Notified? No  MEWS guidelines implemented *See Row Information* No, vital signs rechecked  Treat  Pain Scale 0-10  Pain Score 0  Provider Notification  Date Provider Notified 11/23/22  Provider response See new orders  Date of Provider Response 11/23/22  Notify: Rapid Response  Name of Rapid Response RN Notified Jasmina Rn  Document  Patient Outcome Stabilized after interventions  Progress note created (see row info) Yes  Assess: SIRS CRITERIA  SIRS Temperature  0  SIRS Pulse 0  SIRS Respirations  0  SIRS WBC 0  SIRS Score Sum  0

## 2022-11-23 NOTE — Progress Notes (Signed)
PT Cancellation Note  Patient Details Name: Amy Howell MRN: [id number] DOB: 12-29-1955   Cancelled Treatment:    Reason Eval/Treat Not Completed: PT screened, no needs identified, will sign off Patient was evaluated yesterday by PT and walked 400 independently with no device.  No change in physical status noted therefore will sign-off.  If there is a specific issue that was not addressed please let us know.   Thank you.  [name] 11/23/2022, 12:31 PM [name], [name]  Office [phone] 11/23/2022`

## 2022-11-24 DIAGNOSIS — I161 Hypertensive emergency: Secondary | ICD-10-CM | POA: Diagnosis not present

## 2022-11-24 LAB — CBC WITH DIFFERENTIAL/PLATELET
Abs Immature Granulocytes: 0.01 10*3/uL (ref 0.00–0.07)
Basophils Absolute: 0 10*3/uL (ref 0.0–0.1)
Basophils Relative: 0 %
Eosinophils Absolute: 0.4 10*3/uL (ref 0.0–0.5)
Eosinophils Relative: 7 %
HCT: 35.9 % — ABNORMAL LOW (ref 36.0–46.0)
Hemoglobin: 11.5 g/dL — ABNORMAL LOW (ref 12.0–15.0)
Immature Granulocytes: 0 %
Lymphocytes Relative: 25 %
Lymphs Abs: 1.4 10*3/uL (ref 0.7–4.0)
MCH: 29.6 pg (ref 26.0–34.0)
MCHC: 32 g/dL (ref 30.0–36.0)
MCV: 92.5 fL (ref 80.0–100.0)
Monocytes Absolute: 0.6 10*3/uL (ref 0.1–1.0)
Monocytes Relative: 10 %
Neutro Abs: 3.3 10*3/uL (ref 1.7–7.7)
Neutrophils Relative %: 58 %
Platelets: 156 10*3/uL (ref 150–400)
RBC: 3.88 MIL/uL (ref 3.87–5.11)
RDW: 13.5 % (ref 11.5–15.5)
WBC: 5.7 10*3/uL (ref 4.0–10.5)
nRBC: 0 % (ref 0.0–0.2)

## 2022-11-24 LAB — BASIC METABOLIC PANEL
Anion gap: 11 (ref 5–15)
BUN: 45 mg/dL — ABNORMAL HIGH (ref 8–23)
CO2: 24 mmol/L (ref 22–32)
Calcium: 9.6 mg/dL (ref 8.9–10.3)
Chloride: 100 mmol/L (ref 98–111)
Creatinine, Ser: 1.86 mg/dL — ABNORMAL HIGH (ref 0.44–1.00)
GFR, Estimated: 29 mL/min — ABNORMAL LOW (ref >60)
Glucose, Bld: 92 mg/dL (ref 70–99)
Potassium: 3.7 mmol/L (ref 3.5–5.1)
Sodium: 135 mmol/L (ref 135–145)

## 2022-11-24 LAB — BRAIN NATRIURETIC PEPTIDE: B Natriuretic Peptide: 191.4 pg/mL — ABNORMAL HIGH (ref 0.0–100.0)

## 2022-11-24 LAB — MAGNESIUM: Magnesium: 2.3 mg/dL (ref 1.7–2.4)

## 2022-11-24 MED ORDER — HYDROCORTISONE 1 % EX CREA
TOPICAL_CREAM | Freq: Two times a day (BID) | CUTANEOUS | Status: DC
  Filled 2022-11-24 (×2): qty 28

## 2022-11-24 MED ORDER — SODIUM CHLORIDE 0.9 % IV SOLN: INTRAVENOUS | Status: AC

## 2022-11-24 NOTE — Progress Notes (Signed)
PROGRESS NOTE    Patient: Amy Howell                            PCP: Ladell Pier, MD                    DOB: 1956-09-15            DOA: 11/21/2022 OMV:[id number]             DOS: 11/24/2022, 11:22 AM   LOS: 2 days   Date of Service: The patient was seen and examined on 11/24/2022  Subjective:   The patient was seen and examined this morning, stable denies any chest pain or shortness of breath...  Patient is aware that she needs to stay in the hospital for further evaluation possible cardiac catheterization Aware that her kidney function has gotten worse therefore CT angio has been discontinued  Brief Narrative:   Amy Howell is a 66 yo female with PMH CVA, glaucoma, HTN who presented with shortness of breath.  She endorses difficulty with medications and not tolerating some medications therefore is hesitant on some of them at times.  She has not been taking blood pressure medication for this reason. She is compliant with her Amy Howell which she says she has been on since 2018. On workup she was found to be in hypertensive crisis with blood pressure up to 188/112.  She was tachycardic and tachypneic.  She was placed on BiPAP initially due to work of breathing. Notable labs included increased creatinine (1.36; hard to know baseline given lack of labs in the past). BNP 1,542 and trop initially 8 which then trended upward (8 >> 37 >> 80).  Echo was ordered which showed reduced EF, 30 to 35% with wall motion abnormalities and grade 1 diastolic dysfunction.  Cardiology was consulted due to concern for underlying ischemia. She was also started on a nitroglycerin drip on admission and was transitioned to amlodipine after blood pressure responded.      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, right ([place])     Assessment and Plan: * Hypertensive emergency - BP improved  - POA: BP 188/112; started on NTG gtt and now on amlodipine 10 mg daily but BP still above goal but is improved - also has creat noted at 1.36 on admission (baseline not clear yet) and elevated trop and BNP with echo showing WMA and reduced EF ACEi -BP has stabilized, off nitroglycerin drip -Continue Norvasc and beta-blockers; Coreg   -IV Lasix on hold  -With holding ACEi -due to history of allergy possibly angioedema We will holding ACE/ARB/ARNI/Aldactone due to elevated creatinine  Lab Results  Component Value Date   CREATININE 1.86 (H) 11/24/2022   CREATININE 1.43 (H) 11/23/2022   CREATININE 1.17 (H) 11/22/2022     Acute on chronic combined systolic and diastolic CHF (congestive heart failure) (HCC) - Currently stable, shortness of breath with minimal exertion -Elevated BNP 1542 >> -Chest x-ray positive for congestion, pulmonary edema -was on  IV Lasix 40 mg twice daily>>> discontinued 11/24/2022  - Echo 03/21/2017 LVEF 60-65%, -  Repeat Echo LVEF 30-35%,  multiple WMAs , grade 1 diastolic dysfunction, mild RV dysfunction  No intake or output data in the 24 hours ending 11/24/22 1118 I/O last 3 completed shifts: In: -  Out: 400 [Urine:400] No intake/output data recorded.    - Cardiology following closely -Pending CTA coronary-discontinued due to worsening kidney function, elevated creatinine  - GDMT likely to be initiated but patient does have ACEi allergy -angioedema -Due to AKI, holding ARNI/Aldactone/SGLT2 inhibitor  -Per cardiology: Norvasc, carvedilol -Anticipating gentle IV fluid hydration per cardiology today due to possible overdiuresis, worsening kidney function  -Anticipating left cardiac cath Tuesday, 11/26/2022 per  cardiology  Cryptogenic stroke [facility]) - Stable no new focal neurological findings - April 2018, MRI Brain: Subacute infarcts in the bilateral occipital poles. Small more acute infarcts may be superimposed. Patient has other small remote posterior circulation infarcts. 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, right ([place]) - Underwent R breat mastectomy in 06/2022 with negative margins -She was given anastrozole but refused to take it after the first 3 doses made her somnolent -She is late for oncology f/u  Diabetes mellitus type 2 in nonobese ([place]) - Not on medications at home and patient has tried to control with diet and lifestyle changes - A1c 5.7%  Glaucoma of both eyes - Continue current eyedrops   -------------------------------------------------------------------------------------------------------------------------------------- Nutritional status:  The patient's BMI is: Body mass index is 18.21 kg/m. I agree with the assessment and plan as outlined   DVT prophylaxis:  enoxaparin (LOVENOX) injection 30 mg Start: 11/21/22 1000   Code Status:   Code Status: DNR  Family Communication: Daughter at bedside-updated The above findings and plan of care has been discussed with patient (and family)  in detail,  they expressed understanding and agreement of above. -Advance care planning has been discussed.   Disposition:  from home anticipating discharging home in a.m. Pending CTA coronary,  titration of current medication and monitoring creatinine  Admission status:   Status is: Inpatient Remains inpatient appropriate because: Titrating medication, cardiology evaluation, pending CTA coronary angiogram     Procedures:   No admission procedures for hospital encounter.   Antimicrobials:  Anti-infectives (From admission, onward)    None        Medication:   amLODipine  10 mg Oral Daily   aspirin EC  81 mg Oral Daily   dorzolamide  1 drop Both Eyes BID   enoxaparin (LOVENOX) injection  30 mg Subcutaneous Q24H   feeding supplement (KATE FARMS STANDARD 1.4)  325 mL Oral BID BM   hydrocortisone cream   Topical BID   insulin aspart  0-15 Units Subcutaneous TID WC   insulin aspart  0-5 Units Subcutaneous QHS   latanoprost  1 drop Both Eyes QHS   multivitamin with minerals  1 tablet Oral Daily   nicotine  14 mg Transdermal Daily   sodium chloride flush  3 mL Intravenous Q12H   ticagrelor  45 mg Oral BID   timolol  1 drop Both Eyes Daily    acetaminophen **OR** acetaminophen, bisacodyl, hydrALAZINE, ondansetron **OR** ondansetron (ZOFRAN) IV, polyethylene glycol,  traZODone   Objective:   Vitals:   11/23/22 2133 11/24/22 0553 11/24/22 1054 11/24/22 1100  BP: (!) 143/74 (!) 155/60 (!) 129/56   Pulse: 82  70 70  Resp:   18 19  Temp:   97.6 F (36.4 C)   TempSrc:   Oral   SpO2:   100% 100%  Weight:      Height:       No intake or output data in the 24 hours ending 11/24/22 1122  Filed Weights   11/21/22 0319 11/21/22 0424 11/23/22 0448  Weight: 45.4 kg 45.4 kg 43.7 kg     Examination:      General:  AAO x 3,  cooperative, no distress;   HEENT:  Normocephalic, PERRL, otherwise with in Normal limits   Neuro:  CNII-XII intact. , normal motor and sensation, reflexes intact   Lungs:   Clear to auscultation BL, Respirations unlabored,  No wheezes / crackles  Cardio:    S1/S2, RRR, No murmure, No Rubs or Gallops   Abdomen:  Soft, non-tender, bowel  sounds active all four quadrants, no guarding or peritoneal signs.  Muscular  skeletal:  Limited exam -global generalized weaknesses - in bed, able to move all 4 extremities,   2+ pulses,  symmetric, No pitting edema  Skin:  Dry, warm to touch, negative for any Rashes,  Wounds: Please see nursing documentation          ------------------------------------------------------------------------------------------------------------------------------------------    LABs:     Latest Ref Rng & Units 11/24/2022   12:37 AM 11/23/2022   12:57 AM 11/22/2022    6:09 AM  CBC  WBC 4.0 - 10.5 K/uL 5.7  5.3  6.3   Hemoglobin 12.0 - 15.0 g/dL 11.5  12.2  11.6   Hematocrit 36.0 - 46.0 % 35.9  38.2  35.1   Platelets 150 - 400 K/uL 156  162  152       Latest Ref Rng & Units 11/24/2022   12:37 AM 11/23/2022   12:57 AM 11/22/2022    6:09 AM  CMP  Glucose 70 - 99 mg/dL 92  98  92   BUN 8 - 23 mg/dL 45  29  21   Creatinine 0.44 - 1.00 mg/dL 1.86  1.43  1.17   Sodium 135 - 145 mmol/L 135  137  140   Potassium 3.5 - 5.1 mmol/L 3.7  3.5  3.7   Chloride 98 - 111 mmol/L 100  99  105   CO2 22 - 32 mmol/L '24  26  24   '$ Calcium 8.9 - 10.3 mg/dL 9.6  9.2  8.9        Micro Results Recent Results (from the past 240 hour(s))  Resp panel by RT-PCR (RSV, Flu A&B, Covid) Anterior Nasal Swab     Status: None   Collection Time: 11/21/22  2:16 AM   Specimen: Anterior Nasal Swab  Result Value Ref Range Status   SARS Coronavirus 2 by RT PCR NEGATIVE NEGATIVE Final    Comment: (NOTE) SARS-CoV-2 target nucleic acids are NOT DETECTED.  The SARS-CoV-2 RNA is generally detectable in upper respiratory specimens during the acute phase of infection. The lowest concentration of SARS-CoV-2 viral copies this assay can detect is 138 copies/mL. A negative result does not preclude SARS-Cov-2 infection and should not be used as the sole basis for treatment or other patient management decisions. A negative result  may occur with  improper specimen collection/handling, submission of specimen other than nasopharyngeal  swab, presence of viral mutation(s) within the areas targeted by this assay, and inadequate number of viral copies(<138 copies/mL). A negative result must be combined with clinical observations, patient history, and epidemiological information. The expected result is Negative.  Fact Sheet for Patients:  EntrepreneurPulse.com.au  Fact Sheet for Healthcare Providers:  IncredibleEmployment.be  This test is no t yet approved or cleared by the [place] FDA and  has been authorized for detection and/or diagnosis of SARS-CoV-2 by FDA under an Emergency Use Authorization (EUA). This EUA will remain  in effect (meaning this test can be used) for the duration of the COVID-19 declaration under Section 564(b)(1) of the Act, 21 U.S.C.section 360bbb-3(b)(1), unless the authorization is terminated  or revoked sooner.       Influenza A by PCR NEGATIVE NEGATIVE Final   Influenza B by PCR NEGATIVE NEGATIVE Final    Comment: (NOTE) The Xpert Xpress SARS-CoV-2/FLU/RSV plus assay is intended as an aid in the diagnosis of influenza from Nasopharyngeal swab specimens and should not be used as a sole basis for treatment. Nasal washings and aspirates are unacceptable for Xpert Xpress SARS-CoV-2/FLU/RSV testing.  Fact Sheet for Patients: EntrepreneurPulse.com.au  Fact Sheet for Healthcare Providers: IncredibleEmployment.be  This test is not yet approved or cleared by the [place] FDA and has been authorized for detection and/or diagnosis of SARS-CoV-2 by FDA under an Emergency Use Authorization (EUA). This EUA will remain in effect (meaning this test can be used) for the duration of the COVID-19 declaration under Section 564(b)(1) of the Act, 21 U.S.C. section 360bbb-3(b)(1), unless the authorization is terminated  or revoked.     Resp Syncytial Virus by PCR NEGATIVE NEGATIVE Final    Comment: (NOTE) Fact Sheet for Patients: EntrepreneurPulse.com.au  Fact Sheet for Healthcare Providers: IncredibleEmployment.be  This test is not yet approved or cleared by the [place] FDA and has been authorized for detection and/or diagnosis of SARS-CoV-2 by FDA under an Emergency Use Authorization (EUA). This EUA will remain in effect (meaning this test can be used) for the duration of the COVID-19 declaration under Section 564(b)(1) of the Act, 21 U.S.C. section 360bbb-3(b)(1), unless the authorization is terminated or revoked.  Performed at [facility], [place] [address], [place], [place] [address]     Radiology Reports No results found.  SIGNED: Deatra James, MD, FHM. [facility],  Pager (please use [email or web address] to page/text) Please use Epic Secure Chat for non-urgent communication (7AM-7PM)  If 7PM-7AM, please contact night-coverage [email or web address], 11/24/2022, 11:22 AM

## 2022-11-24 NOTE — Progress Notes (Signed)
Rounding Note    Patient Name: Amy Howell Date of Encounter: 11/24/2022  [place] Cardiologist: New  Subjective   No complaints  Inpatient Medications    Scheduled Meds:  amLODipine  10 mg Oral Daily   aspirin EC  81 mg Oral Daily   dorzolamide  1 drop Both Eyes BID   enoxaparin (LOVENOX) injection  30 mg Subcutaneous Q24H   feeding supplement (KATE FARMS STANDARD 1.4)  325 mL Oral BID BM   insulin aspart  0-15 Units Subcutaneous TID WC   insulin aspart  0-5 Units Subcutaneous QHS   latanoprost  1 drop Both Eyes QHS   multivitamin with minerals  1 tablet Oral Daily   nicotine  14 mg Transdermal Daily   sodium chloride flush  3 mL Intravenous Q12H   ticagrelor  45 mg Oral BID   timolol  1 drop Both Eyes Daily   Continuous Infusions:  PRN Meds: acetaminophen **OR** acetaminophen, bisacodyl, hydrALAZINE, ondansetron **OR** ondansetron (ZOFRAN) IV, polyethylene glycol, traZODone   Vital Signs    Vitals:   11/23/22 1800 11/23/22 1925 11/23/22 2133 11/24/22 0553  BP: (!) 152/74 (!) 138/56 (!) 143/74 (!) 155/60  Pulse: 72 70 82   Resp: 15 16    Temp: 97.8 F (36.6 C) 97.8 F (36.6 C)    TempSrc: Oral Oral    SpO2: 97% 98%    Weight:      Height:       No intake or output data in the 24 hours ending 11/24/22 0739    11/23/2022    4:48 AM 11/21/2022    4:24 AM 11/21/2022    3:19 AM  Last 3 Weights  Weight (lbs) 96 lb 6.4 oz 100 lb 100 lb  Weight (kg) 43.727 kg 45.36 kg 45.36 kg      Telemetry    SR - Personally Reviewed  ECG    N/a - Personally Reviewed  Physical Exam   GEN: No acute distress.   Neck: No JVD Cardiac: RRR, no murmurs, rubs, or gallops.  Respiratory: Clear to auscultation bilaterally. GI: Soft, nontender, non-distended  MS: No edema; No deformity. Neuro:  Nonfocal  Psych: Normal affect   Labs    High Sensitivity Troponin:   Recent Labs  Lab 11/21/22 0135 11/21/22 0416 11/21/22 0940 11/21/22 1228   TROPONINIHS 8 37* 80* 79*     Chemistry Recent Labs  Lab 11/21/22 0135 11/21/22 0142 11/22/22 0609 11/23/22 0057 11/24/22 0037  NA 135   < > 140 137 135  K 4.0   < > 3.7 3.5 3.7  CL 105   < > 105 99 100  CO2 17*  --  '24 26 24  '$ GLUCOSE 293*   < > 92 98 92  BUN 20   < > 21 29* 45*  CREATININE 1.36*   < > 1.17* 1.43* 1.86*  CALCIUM 8.8*  --  8.9 9.2 9.6  MG  --   --   --  1.9 2.3  PROT 7.5  --   --   --   --   ALBUMIN 3.5  --   --   --   --   AST 40  --   --   --   --   ALT 25  --   --   --   --   ALKPHOS 103  --   --   --   --   BILITOT 0.5  --   --   --   --  GFRNONAA 42*  --  51* 40* 29*  ANIONGAP 13  --  '11 12 11   '$ < > = values in this interval not displayed.    Lipids  Recent Labs  Lab 11/22/22 0609  CHOL 194  TRIG 80  HDL 43  LDLCALC 135*  CHOLHDL 4.5    Hematology Recent Labs  Lab 11/22/22 0609 11/23/22 0057 11/24/22 0037  WBC 6.3 5.3 5.7  RBC 3.82* 4.11 3.88  HGB 11.6* 12.2 11.5*  HCT 35.1* 38.2 35.9*  MCV 91.9 92.9 92.5  MCH 30.4 29.7 29.6  MCHC 33.0 31.9 32.0  RDW 13.6 13.4 13.5  PLT 152 162 156   Thyroid  Recent Labs  Lab 11/22/22 0609  TSH 0.506    BNP Recent Labs  Lab 11/21/22 0135 11/24/22 0037  BNP 1,542.9* 191.4*    DDimer No results for input(s): "DDIMER" in the last 168 hours.   Radiology    ECHOCARDIOGRAM COMPLETE  Result Date: 11/22/2022    ECHOCARDIOGRAM REPORT   Patient Name:   Amy Howell Date of Exam: 11/22/2022 Medical Rec #:  [id number]     Height:       61.0 in Accession #:    [id number]    Weight:       100.0 lb Date of Birth:  [birth year]      BSA:          1.407 m Patient Age:    66 years      BP:           148/82 mmHg Patient Gender: F             HR:           71 bpm. Exam Location:  Inpatient Procedure: 2D Echo, Cardiac Doppler, Color Doppler and Strain Analysis Indications:    CHF-Acute Systolic 517.61/Y07.37  History:        Patient has prior history of Echocardiogram examinations, most                 recent  03/27/2017. Pacemaker, Stroke; Risk Factors:Hypertension,                 Diabetes and Current Smoker. Breast Cancer.  Sonographer:    Ronny Flurry Referring Phys: Karmen Bongo IMPRESSIONS  1. Global hypokinesis; worse in the Inferior/inferolateral wall from base to apex; GLS worse in the lateral wall; c/f possible left circumflex disease. Left ventricular ejection fraction, by estimation, is 30 to 35%. The left ventricle has moderately decreased function. There is moderate asymmetric left ventricular hypertrophy of the basal-septal segment. Left ventricular diastolic parameters are consistent with Grade I diastolic dysfunction (impaired relaxation).  2. Right ventricular systolic function is mildly reduced. The right ventricular size is normal. Tricuspid regurgitation signal is inadequate for assessing PA pressure.  3. Left atrial size was moderately dilated.  4. Trivial mitral valve regurgitation.  5. The aortic valve was not well visualized. Aortic valve regurgitation is mild to moderate. Aortic valve sclerosis is present, with no evidence of aortic valve stenosis.  6. The inferior vena cava is normal in size with greater than 50% respiratory variability, suggesting right atrial pressure of 3 mmHg. Conclusion(s)/Recommendation(s): Ef reported normal on prior TTE in 2018. With WMA above, recommend an ischemic evaluation. FINDINGS  Left Ventricle: Global hypokinesis; worse in the Inferior/inferolateral wall from base to apex; GLS worse in the lateral wall; c/f possible left circumflex disease. Left ventricular ejection fraction, by estimation, is 30 to 35%. The left ventricle  has moderately decreased function. The left ventricular internal cavity size was normal in size. There is moderate asymmetric left ventricular hypertrophy of the basal-septal segment. Left ventricular diastolic parameters are consistent with Grade I diastolic dysfunction (impaired relaxation). Right Ventricle: The right ventricular size  is normal. Right ventricular systolic function is mildly reduced. Tricuspid regurgitation signal is inadequate for assessing PA pressure. Left Atrium: Left atrial size was moderately dilated. Right Atrium: Right atrial size was normal in size. Pericardium: There is no evidence of pericardial effusion. Mitral Valve: Trivial mitral valve regurgitation. Tricuspid Valve: Tricuspid valve regurgitation is not demonstrated. Aortic Valve: The aortic valve was not well visualized. Aortic valve regurgitation is mild to moderate. Aortic regurgitation PHT measures 490 msec. Aortic valve sclerosis is present, with no evidence of aortic valve stenosis. Aortic valve mean gradient measures 5.0 mmHg. Aortic valve peak gradient measures 9.7 mmHg. Aortic valve area, by VTI measures 2.51 cm. Pulmonic Valve: Pulmonic valve regurgitation is not visualized. Aorta: The aortic root and ascending aorta are structurally normal, with no evidence of dilitation. Venous: The inferior vena cava is normal in size with greater than 50% respiratory variability, suggesting right atrial pressure of 3 mmHg. IAS/Shunts: No atrial level shunt detected by color flow Doppler.  LEFT VENTRICLE PLAX 2D LVIDd:         4.40 cm LVIDs:         3.70 cm LV PW:         1.60 cm LV IVS:        1.40 cm LVOT diam:     2.00 cm   3D Volume EF: LV SV:         68        3D EF:        26 % LV SV Index:   48        LV EDV:       162 ml LVOT Area:     3.14 cm  LV ESV:       119 ml                          LV SV:        43 ml RIGHT VENTRICLE            IVC RV S prime:     8.05 cm/s  IVC diam: 1.20 cm TAPSE (M-mode): 1.2 cm LEFT ATRIUM             Index        RIGHT ATRIUM           Index LA diam:        2.40 cm 1.71 cm/m   RA Area:     10.90 cm LA Vol (A2C):   43.3 ml 30.78 ml/m  RA Volume:   26.50 ml  18.84 ml/m LA Vol (A4C):   48.2 ml 34.26 ml/m LA Biplane Vol: 46.3 ml 32.91 ml/m  AORTIC VALVE AV Area (Vmax):    2.71 cm AV Area (Vmean):   2.45 cm AV Area (VTI):      2.51 cm AV Vmax:           155.50 cm/s AV Vmean:          97.750 cm/s AV VTI:            0.270 m AV Peak Grad:      9.7 mmHg AV Mean Grad:      5.0 mmHg LVOT Vmax:  134.00 cm/s LVOT Vmean:        76.350 cm/s LVOT VTI:          0.216 m LVOT/AV VTI ratio: 0.80 AI PHT:            490 msec  AORTA Ao Root diam: 2.50 cm Ao Asc diam:  3.50 cm MITRAL VALVE MV Area (PHT): 4.04 cm    SHUNTS MV Decel Time: 188 msec    Systemic VTI:  0.22 m MV E velocity: 49.00 cm/s  Systemic Diam: 2.00 cm MV A velocity: 90.80 cm/s MV E/A ratio:  0.54 Landscape architect signed by Phineas Inches Signature Date/Time: 11/22/2022/11:22:54 AM    Final     Cardiac Studies     Patient Profile     Amy Howell is a 66 y.o. female with a hx of hypertension, DM 2, history of CVA and right breast cancer s/p mastectomy who is being seen 11/22/2022 for the evaluation of acute systolic heart failure and abnormal echocardiogram at the request of Dr. Sabino Gasser.    Assessment & Plan    1.Acute HFrEF - 03/2017 echo LVEF 65-70% - echo this admit LVEF 30-35%, multiple WMAs, grade I dd, mild RV dysfunction -CXR central congestin, pulm edema, BNP 1542   I/Os are incomplete, yesterday she appeared euvolemic with rising Cr and diurtetic stopped. Further rise to 1.8 today, continue to hold diuretic. Will give gentle IVFs today - With AKI hold on ACE/ARB/ARNI/aldactone/SGLT2i today. She has history of angioedema on ACEi, would also have to avoid ARNI could consider ARB. She got '100mg'$  of lopressor this AM as part of CT protocol so holding on coreg.    - given drop in LVEF and and new WMAs plan from Dr Emily Filbert consult note was for coronary CTA since cath would not be until Tuesday given holiday weekend. Test cancelled Saturday as scanner was not available due to emergent cases. Cr up to 1.8 today, would continue to hold on scan.  Discussed with patient discharge tomorrow if Cr improving and outpatient ischemic testing but she favors  completing her workup as inpatient including staying over [place] holiday. In that case would plan for cath Tuesday pending renal function.         2.Hypertensive emergency - bp's improved on oral regimen, had not been taking meds at home   3.DM2   4. History of CVA in April 2018: TEE in 2018 was negative.  Subsequent loop recorder did not reveal atrial fibrillation.  Loop recorder was explanted in March 2020.   5. History of right breast cancer s/p right mastectomy.     6. History of cerebral stenting - she is on ASA 81, brillinta '45mg'$  bid  For questions or updates, please contact [place] Please consult [email or web address] for contact info under        Signed, Carlyle Dolly, MD  11/24/2022, 7:39 AM

## 2022-11-25 DIAGNOSIS — I161 Hypertensive emergency: Secondary | ICD-10-CM | POA: Diagnosis not present

## 2022-11-25 DIAGNOSIS — N179 Acute kidney failure, unspecified: Secondary | ICD-10-CM

## 2022-11-25 DIAGNOSIS — I5021 Acute systolic (congestive) heart failure: Secondary | ICD-10-CM | POA: Diagnosis not present

## 2022-11-25 LAB — CBC WITH DIFFERENTIAL/PLATELET
Abs Immature Granulocytes: 0.02 10*3/uL (ref 0.00–0.07)
Basophils Absolute: 0 10*3/uL (ref 0.0–0.1)
Basophils Relative: 0 %
Eosinophils Absolute: 0.3 10*3/uL (ref 0.0–0.5)
Eosinophils Relative: 5 %
HCT: 35.2 % — ABNORMAL LOW (ref 36.0–46.0)
Hemoglobin: 10.9 g/dL — ABNORMAL LOW (ref 12.0–15.0)
Immature Granulocytes: 0 %
Lymphocytes Relative: 20 %
Lymphs Abs: 1.4 10*3/uL (ref 0.7–4.0)
MCH: 29.2 pg (ref 26.0–34.0)
MCHC: 31 g/dL (ref 30.0–36.0)
MCV: 94.4 fL (ref 80.0–100.0)
Monocytes Absolute: 0.7 10*3/uL (ref 0.1–1.0)
Monocytes Relative: 11 %
Neutro Abs: 4.3 10*3/uL (ref 1.7–7.7)
Neutrophils Relative %: 64 %
Platelets: 152 10*3/uL (ref 150–400)
RBC: 3.73 MIL/uL — ABNORMAL LOW (ref 3.87–5.11)
RDW: 13.3 % (ref 11.5–15.5)
WBC: 6.7 10*3/uL (ref 4.0–10.5)
nRBC: 0 % (ref 0.0–0.2)

## 2022-11-25 LAB — BASIC METABOLIC PANEL
Anion gap: 8 (ref 5–15)
BUN: 35 mg/dL — ABNORMAL HIGH (ref 8–23)
CO2: 24 mmol/L (ref 22–32)
Calcium: 9.8 mg/dL (ref 8.9–10.3)
Chloride: 105 mmol/L (ref 98–111)
Creatinine, Ser: 1.22 mg/dL — ABNORMAL HIGH (ref 0.44–1.00)
GFR, Estimated: 48 mL/min — ABNORMAL LOW (ref >60)
Glucose, Bld: 98 mg/dL (ref 70–99)
Potassium: 4.1 mmol/L (ref 3.5–5.1)
Sodium: 137 mmol/L (ref 135–145)

## 2022-11-25 LAB — MAGNESIUM: Magnesium: 2.3 mg/dL (ref 1.7–2.4)

## 2022-11-25 LAB — BRAIN NATRIURETIC PEPTIDE: B Natriuretic Peptide: 278.6 pg/mL — ABNORMAL HIGH (ref 0.0–100.0)

## 2022-11-25 MED ORDER — SODIUM CHLORIDE 0.9% FLUSH
3.0000 mL | Freq: Two times a day (BID) | INTRAVENOUS | Status: DC
  Administered 2022-11-25 (×2): 3 mL via INTRAVENOUS

## 2022-11-25 MED ORDER — SODIUM CHLORIDE 0.9 % IV SOLN: 250.0000 mL | INTRAVENOUS | Status: DC | PRN

## 2022-11-25 MED ORDER — LOSARTAN POTASSIUM 25 MG PO TABS
12.5000 mg | ORAL_TABLET | Freq: Every day | ORAL | Status: DC
  Administered 2022-11-25 – 2022-11-27 (×2): 12.5 mg via ORAL
  Filled 2022-11-25 (×2): qty 1

## 2022-11-25 MED ORDER — CARVEDILOL 6.25 MG PO TABS
6.2500 mg | ORAL_TABLET | Freq: Two times a day (BID) | ORAL | Status: DC
  Administered 2022-11-25 – 2022-11-27 (×5): 6.25 mg via ORAL
  Filled 2022-11-25 (×5): qty 1

## 2022-11-25 MED ORDER — ASPIRIN 81 MG PO CHEW
81.0000 mg | CHEWABLE_TABLET | ORAL | Status: AC
  Administered 2022-11-26: 81 mg via ORAL
  Filled 2022-11-25: qty 1

## 2022-11-25 MED ORDER — SODIUM CHLORIDE 0.9 % IV SOLN: INTRAVENOUS | Status: DC

## 2022-11-25 MED ORDER — SODIUM CHLORIDE 0.9% FLUSH: 3.0000 mL | INTRAVENOUS | Status: DC | PRN

## 2022-11-25 NOTE — Progress Notes (Signed)
Rounding Note    Patient Name: Amy Howell Date of Encounter: 11/25/2022  [place] Cardiologist: New  Subjective   No complaints  Inpatient Medications    Scheduled Meds:  amLODipine  10 mg Oral Daily   aspirin EC  81 mg Oral Daily   dorzolamide  1 drop Both Eyes BID   enoxaparin (LOVENOX) injection  30 mg Subcutaneous Q24H   feeding supplement (KATE FARMS STANDARD 1.4)  325 mL Oral BID BM   hydrocortisone cream   Topical BID   insulin aspart  0-15 Units Subcutaneous TID WC   insulin aspart  0-5 Units Subcutaneous QHS   latanoprost  1 drop Both Eyes QHS   multivitamin with minerals  1 tablet Oral Daily   nicotine  14 mg Transdermal Daily   sodium chloride flush  3 mL Intravenous Q12H   ticagrelor  45 mg Oral BID   timolol  1 drop Both Eyes Daily   Continuous Infusions:  PRN Meds: acetaminophen **OR** acetaminophen, bisacodyl, hydrALAZINE, ondansetron **OR** ondansetron (ZOFRAN) IV, polyethylene glycol, traZODone   Vital Signs    Vitals:   11/24/22 0553 11/24/22 1054 11/24/22 1100 11/24/22 1940  BP: (!) 155/60 (!) 129/56  (!) 148/79  Pulse:  70 70 77  Resp:  '18 19 18  '$ Temp:  97.6 F (36.4 C)  97.8 F (36.6 C)  TempSrc:  Oral  Oral  SpO2:  100% 100% 96%  Weight:      Height:        Intake/Output Summary (Last 24 hours) at 11/25/2022 0532 Last data filed at 11/24/2022 1800 Gross per 24 hour  Intake 477 ml  Output 101 ml  Net 376 ml      11/23/2022    4:48 AM 11/21/2022    4:24 AM 11/21/2022    3:19 AM  Last 3 Weights  Weight (lbs) 96 lb 6.4 oz 100 lb 100 lb  Weight (kg) 43.727 kg 45.36 kg 45.36 kg      Telemetry    SR - Personally Reviewed  ECG    N/a - Personally Reviewed  Physical Exam   GEN: No acute distress.   Neck: No JVD Cardiac: RRR, no murmurs, rubs, or gallops.  Respiratory: Clear to auscultation bilaterally. GI: Soft, nontender, non-distended  MS: No edema; No deformity. Neuro:  Nonfocal  Psych: Normal  affect   Labs    High Sensitivity Troponin:   Recent Labs  Lab 11/21/22 0135 11/21/22 0416 11/21/22 0940 11/21/22 1228  TROPONINIHS 8 37* 80* 79*     Chemistry Recent Labs  Lab 11/21/22 0135 11/21/22 0142 11/23/22 0057 11/24/22 0037 11/25/22 0049  NA 135   < > 137 135 137  K 4.0   < > 3.5 3.7 4.1  CL 105   < > 99 100 105  CO2 17*   < > '26 24 24  '$ GLUCOSE 293*   < > 98 92 98  BUN 20   < > 29* 45* 35*  CREATININE 1.36*   < > 1.43* 1.86* 1.22*  CALCIUM 8.8*   < > 9.2 9.6 9.8  MG  --   --  1.9 2.3 2.3  PROT 7.5  --   --   --   --   ALBUMIN 3.5  --   --   --   --   AST 40  --   --   --   --   ALT 25  --   --   --   --  ALKPHOS 103  --   --   --   --   BILITOT 0.5  --   --   --   --   GFRNONAA 42*   < > 40* 29* 48*  ANIONGAP 13   < > '12 11 8   '$ < > = values in this interval not displayed.    Lipids  Recent Labs  Lab 11/22/22 0609  CHOL 194  TRIG 80  HDL 43  LDLCALC 135*  CHOLHDL 4.5    Hematology Recent Labs  Lab 11/23/22 0057 11/24/22 0037 11/25/22 0049  WBC 5.3 5.7 6.7  RBC 4.11 3.88 3.73*  HGB 12.2 11.5* 10.9*  HCT 38.2 35.9* 35.2*  MCV 92.9 92.5 94.4  MCH 29.7 29.6 29.2  MCHC 31.9 32.0 31.0  RDW 13.4 13.5 13.3  PLT 162 156 152   Thyroid  Recent Labs  Lab 11/22/22 0609  TSH 0.506    BNP Recent Labs  Lab 11/21/22 0135 11/24/22 0037 11/25/22 0049  BNP 1,542.9* 191.4* 278.6*    DDimer No results for input(s): "DDIMER" in the last 168 hours.   Radiology    No results found.  Cardiac Studies     Patient Profile     Amy Howell is a 66 y.o. female with a hx of hypertension, DM 2, history of CVA and right breast cancer s/p mastectomy who is being seen 11/22/2022 for the evaluation of acute systolic heart failure and abnormal echocardiogram at the request of Dr. Sabino Gasser.    Assessment & Plan   1.Acute HFrEF - 03/2017 echo LVEF 65-70% - echo this admit LVEF 30-35%, multiple WMAs, grade I dd, mild RV dysfunction -CXR central  congestin, pulm edema, BNP 1542    - diuresed this admit, euvolemic and elevation in Cr diuretics stopped. Cr up to 1.86 peak, down to 1.2 today with gentle IVFs yesterday - with AKI had not started HF meds  She has history of angioedema on ACEi, would also have to avoid ARNI could consider ARB.  - start coreg 6.'25mg'$  bid, losartan 12.'5mg'$  daily today. Titrate as tolerated, MRA and SGLT2i very soon   - given drop in LVEF and and new WMAs plan from Dr Emily Filbert consult note was for coronary CTA since cath would not be until Tuesday given holiday weekend. Test cancelled Saturday as scanner was not available due to emergent cases. Cr up to 1.8 Sunday and CT cancelled  Discussed with patient discharge tomorrow if Cr improving and outpatient ischemic testing but she favors completing her workup as inpatient including staying over [place] holiday.  -plan for RHC/LHC Tuesday         2.Hypertensive emergency - bp's improved on oral regimen, had not been taking meds at home   3.DM2   4. History of CVA in April 2018: TEE in 2018 was negative.  Subsequent loop recorder did not reveal atrial fibrillation.  Loop recorder was explanted in March 2020.   5. History of right breast cancer s/p right mastectomy.     6. History of cerebral stenting - she is on ASA 81, brillinta '45mg'$  bid  7. AKI  -resolved off diuretics with gentle IVFs yesterday  Shared Decision Making/Informed Consent The risks [stroke (1 in 1000), death (1 in 1000), kidney failure [usually temporary] (1 in 500), bleeding (1 in 200), allergic reaction [possibly serious] (1 in 200)], benefits (diagnostic support and management of coronary artery disease) and alternatives of a cardiac catheterization were discussed in detail with Amy Howell and  she is willing to proceed.   For questions or updates, please contact [place] Please consult [email or web address] for contact info under        Signed, Carlyle Dolly, MD  11/25/2022,  5:32 AM

## 2022-11-25 NOTE — Progress Notes (Signed)
PROGRESS NOTE    Patient: Amy Howell                            PCP: Ladell Pier, MD                    DOB: [birth year]            DOA: 11/21/2022 TOI:[id number]             DOS: 11/25/2022, 11:14 AM   LOS: 3 days   Date of Service: The patient was seen and examined on 11/25/2022  Subjective:   The patient was seen and examined this morning, stable no acute distress denies any chest pain or shortness of breath CTA coronaries was discontinued due to elevated creatinine Medication has been adjusted for better blood pressure control, and treatment of worsening CHF She is aware of medication, compliant  Tentative plan for left and right cardiac catheterization tomorrow 11/26/2022  Brief Narrative:   Amy Howell is a 66 yo female with PMH CVA, glaucoma, HTN who presented with shortness of breath.  She endorses difficulty with medications and not tolerating some medications therefore is hesitant on some of them at times.  She has not been taking blood pressure medication for this reason. She is compliant with her [name] which she says she has been on since 2018. On workup she was found to be in hypertensive crisis with blood pressure up to 188/112.  She was tachycardic and tachypneic.  She was placed on BiPAP initially due to work of breathing. Notable labs included increased creatinine (1.36; hard to know baseline given lack of labs in the past). BNP 1,542 and trop initially 8 which then trended upward (8 >> 37 >> 80).  Echo was ordered which showed reduced EF, 30 to 35% with wall motion abnormalities and grade 1 diastolic dysfunction.  Cardiology was consulted due to concern for underlying ischemia. She was also started on a nitroglycerin drip on admission and was transitioned to amlodipine after blood pressure responded.      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, right ([place])     Assessment and Plan: * Hypertensive emergency - BP improved -  - POA: BP 188/112; started on NTG gtt  >> off  -BP improved on Norvasc, Coreg, losartan  -IV Lasix on hold  -With holding ACEi -due to history of allergy possibly angioedema    Acute on chronic combined systolic and diastolic CHF (congestive heart failure) (HCC) - Currently stable, shortness of breath with minimal exertion -Elevated BNP 1542 >> 191.4 >>> 278.6 -Chest x-ray positive for congestion, pulmonary edema -was on  IV Lasix 40 mg twice daily>>> discontinued 11/24/2022  - Echo 03/21/2017 LVEF 60-65%, -  Repeat Echo LVEF 30-35%, multiple WMAs , grade 1 diastolic dysfunction, mild RV dysfunction   Intake/Output Summary (Last 24 hours) at 11/25/2022 1107 Last data filed at 11/25/2022 0804 Gross per 24 hour  Intake 713 ml  Output 101 ml  Net 612 ml       - Cardiology following closely -Pending CTA coronary-discontinued due  to worsening kidney function, elevated creatinine  - GDMT likely to be initiated but patient does have ACEi allergy -angioedema -Due to AKI, holding ARNI/Aldactone/SGLT2 inhibitor  -Per cardiology: Norvasc, carvedilol, and now losartan -Holding Lasix, diuretics, mildly overdiuresis, gentle IV fluid Improved kidney function  -Anticipating left cardiac cath Tuesday, 11/26/2022 per cardiology  AKI (acute kidney injury) ([place]) - Holding Lasix -Avoiding nephrotoxins -Improving BUN/creatinine Lab Results  Component Value Date   CREATININE 1.22 (H) 11/25/2022   CREATININE 1.86 (H) 11/24/2022   CREATININE 1.43 (H) 11/23/2022      Cryptogenic stroke ([place]) - Stable no new focal neurological findings -  April 2018, MRI Brain: Subacute infarcts in the bilateral occipital poles. Small more acute infarcts may be superimposed. Patient has other small remote posterior circulation infarcts. 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, right ([place]) - Underwent R breat mastectomy in 06/2022 with negative margins -She was given anastrozole but refused to take it after the first 3 doses made her somnolent -She is late for oncology f/u  Diabetes mellitus type 2 in nonobese ([place]) - Not on medications at home and patient has tried to control with diet and lifestyle changes - A1c 5.7%  Glaucoma of both eyes - Continue current eyedrops   -------------------------------------------------------------------------------------------------------------------------------------- Nutritional status:  The patient's BMI is: Body mass index is 18.78 kg/m. I agree with the assessment and plan as outlined   DVT prophylaxis:  enoxaparin (LOVENOX) injection 30 mg Start: 11/21/22 1000   Code Status:   Code Status: DNR  Family Communication: Daughter at bedside-updated The above findings and plan of care has been discussed with patient (and family)  in detail,  they expressed understanding and agreement of above. -Advance care planning has been discussed.   Disposition:  from home anticipating discharging home in a.m. Pending CTA coronary, titration of current medication and monitoring creatinine  Admission status:   Status is: Inpatient Remains inpatient appropriate because: Titrating  medication, cardiology evaluation, pending CTA coronary angiogram     Procedures:   No admission procedures for hospital encounter.   Antimicrobials:  Anti-infectives (From admission, onward)    None        Medication:   amLODipine  10 mg Oral Daily   aspirin EC  81 mg Oral Daily   carvedilol  6.25 mg Oral BID WC   dorzolamide  1 drop Both Eyes BID   enoxaparin (LOVENOX) injection  30 mg Subcutaneous Q24H   feeding supplement (KATE FARMS STANDARD 1.4)  325 mL Oral BID BM   hydrocortisone cream   Topical BID   insulin aspart  0-15 Units Subcutaneous TID WC   insulin aspart  0-5 Units Subcutaneous QHS   latanoprost  1 drop Both Eyes QHS   losartan  12.5 mg Oral Daily   multivitamin with minerals  1 tablet Oral Daily   nicotine  14 mg Transdermal Daily   sodium chloride flush  3 mL Intravenous Q12H   sodium chloride flush  3 mL Intravenous Q12H   ticagrelor  45 mg Oral BID   timolol  1 drop Both Eyes Daily    acetaminophen **  OR** acetaminophen, bisacodyl, hydrALAZINE, ondansetron **OR** ondansetron (ZOFRAN) IV, polyethylene glycol, traZODone   Objective:   Vitals:   11/24/22 1100 11/24/22 1940 11/25/22 0500 11/25/22 0553  BP:  (!) 148/79  139/61  Pulse: 70 77  78  Resp: '19 18  20  '$ Temp:  97.8 F (36.6 C)  97.8 F (36.6 C)  TempSrc:  Oral  Oral  SpO2: 100% 96%  98%  Weight:   45.1 kg   Height:        Intake/Output Summary (Last 24 hours) at 11/25/2022 1114 Last data filed at 11/25/2022 0804 Gross per 24 hour  Intake 713 ml  Output 101 ml  Net 612 ml    Filed Weights   11/21/22 0424 11/23/22 0448 11/25/22 0500  Weight: 45.4 kg 43.7 kg 45.1 kg     Examination:    General:  AAO x 3,  cooperative, no distress;   HEENT:  Normocephalic, PERRL, otherwise with in Normal limits   Neuro:  CNII-XII intact. , normal motor and sensation, reflexes intact   Lungs:   Clear to auscultation BL, Respirations unlabored,  No wheezes / crackles  Cardio:    S1/S2,  RRR, No murmure, No Rubs or Gallops   Abdomen:  Soft, non-tender, bowel sounds active all four quadrants, no guarding or peritoneal signs.  Muscular  skeletal:  Limited exam -global generalized weaknesses - in bed, able to move all 4 extremities,   2+ pulses,  symmetric, No pitting edema  Skin:  Dry, warm to touch, negative for any Rashes,  Wounds: Please see nursing documentation             ------------------------------------------------------------------------------------------------------------------------------------------    LABs:     Latest Ref Rng & Units 11/25/2022   12:49 AM 11/24/2022   12:37 AM 11/23/2022   12:57 AM  CBC  WBC 4.0 - 10.5 K/uL 6.7  5.7  5.3   Hemoglobin 12.0 - 15.0 g/dL 10.9  11.5  12.2   Hematocrit 36.0 - 46.0 % 35.2  35.9  38.2   Platelets 150 - 400 K/uL 152  156  162       Latest Ref Rng & Units 11/25/2022   12:49 AM 11/24/2022   12:37 AM 11/23/2022   12:57 AM  CMP  Glucose 70 - 99 mg/dL 98  92  98   BUN 8 - 23 mg/dL 35  45  29   Creatinine 0.44 - 1.00 mg/dL 1.22  1.86  1.43   Sodium 135 - 145 mmol/L 137  135  137   Potassium 3.5 - 5.1 mmol/L 4.1  3.7  3.5   Chloride 98 - 111 mmol/L 105  100  99   CO2 22 - 32 mmol/L '24  24  26   '$ Calcium 8.9 - 10.3 mg/dL 9.8  9.6  9.2        Micro Results Recent Results (from the past 240 hour(s))  Resp panel by RT-PCR (RSV, Flu A&B, Covid) Anterior Nasal Swab     Status: None   Collection Time: 11/21/22  2:16 AM   Specimen: Anterior Nasal Swab  Result Value Ref Range Status   SARS Coronavirus 2 by RT PCR NEGATIVE NEGATIVE Final    Comment: (NOTE) SARS-CoV-2 target nucleic acids are NOT DETECTED.  The SARS-CoV-2 RNA is generally detectable in upper respiratory specimens during the acute phase of infection. The lowest concentration of SARS-CoV-2 viral copies this assay can detect is 138 copies/mL. A negative result does not preclude SARS-Cov-2 infection  and should not be used as the sole  basis for treatment or other patient management decisions. A negative result may occur with  improper specimen collection/handling, submission of specimen other than nasopharyngeal swab, presence of viral mutation(s) within the areas targeted by this assay, and inadequate number of viral copies(<138 copies/mL). A negative result must be combined with clinical observations, patient history, and epidemiological information. The expected result is Negative.  Fact Sheet for Patients:  EntrepreneurPulse.com.au  Fact Sheet for Healthcare Providers:  IncredibleEmployment.be  This test is no t yet approved or cleared by the [place] FDA and  has been authorized for detection and/or diagnosis of SARS-CoV-2 by FDA under an Emergency Use Authorization (EUA). This EUA will remain  in effect (meaning this test can be used) for the duration of the COVID-19 declaration under Section 564(b)(1) of the Act, 21 U.S.C.section 360bbb-3(b)(1), unless the authorization is terminated  or revoked sooner.       Influenza A by PCR NEGATIVE NEGATIVE Final   Influenza B by PCR NEGATIVE NEGATIVE Final    Comment: (NOTE) The Xpert Xpress SARS-CoV-2/FLU/RSV plus assay is intended as an aid in the diagnosis of influenza from Nasopharyngeal swab specimens and should not be used as a sole basis for treatment. Nasal washings and aspirates are unacceptable for Xpert Xpress SARS-CoV-2/FLU/RSV testing.  Fact Sheet for Patients: EntrepreneurPulse.com.au  Fact Sheet for Healthcare Providers: IncredibleEmployment.be  This test is not yet approved or cleared by the [place] FDA and has been authorized for detection and/or diagnosis of SARS-CoV-2 by FDA under an Emergency Use Authorization (EUA). This EUA will remain in effect (meaning this test can be used) for the duration of the COVID-19 declaration under Section 564(b)(1) of the Act,  21 U.S.C. section 360bbb-3(b)(1), unless the authorization is terminated or revoked.     Resp Syncytial Virus by PCR NEGATIVE NEGATIVE Final    Comment: (NOTE) Fact Sheet for Patients: EntrepreneurPulse.com.au  Fact Sheet for Healthcare Providers: IncredibleEmployment.be  This test is not yet approved or cleared by the [place] FDA and has been authorized for detection and/or diagnosis of SARS-CoV-2 by FDA under an Emergency Use Authorization (EUA). This EUA will remain in effect (meaning this test can be used) for the duration of the COVID-19 declaration under Section 564(b)(1) of the Act, 21 U.S.C. section 360bbb-3(b)(1), unless the authorization is terminated or revoked.  Performed at [facility], [place] [address], [place], [place] [address]     Radiology Reports No results found.  SIGNED: Deatra James, MD, FHM. [facility],  Pager (please use [email or web address] to page/text) Please use Epic Secure Chat for non-urgent communication (7AM-7PM)  If 7PM-7AM, please contact night-coverage [email or web address], 11/25/2022, 11:14 AM

## 2022-11-25 NOTE — Assessment & Plan Note (Signed)
-   Holding Lasix -Avoiding nephrotoxins -Improving BUN/creatinine Lab Results  Component Value Date   CREATININE 1.22 (H) 11/25/2022   CREATININE 1.86 (H) 11/24/2022   CREATININE 1.43 (H) 11/23/2022

## 2022-11-26 ENCOUNTER — Encounter (HOSPITAL_COMMUNITY)
Admission: EM | Disposition: A | Discharge status: Home / Self Care | Payer: Self-pay | Source: Home / Self Care | Attending: Family Medicine

## 2022-11-26 DIAGNOSIS — I161 Hypertensive emergency: Secondary | ICD-10-CM | POA: Diagnosis not present

## 2022-11-26 DIAGNOSIS — I2510 Atherosclerotic heart disease of native coronary artery without angina pectoris: Secondary | ICD-10-CM | POA: Diagnosis not present

## 2022-11-26 DIAGNOSIS — I420 Dilated cardiomyopathy: Secondary | ICD-10-CM

## 2022-11-26 DIAGNOSIS — I5021 Acute systolic (congestive) heart failure: Secondary | ICD-10-CM

## 2022-11-26 HISTORY — PX: CORONARY STENT INTERVENTION: CATH118234

## 2022-11-26 HISTORY — PX: RIGHT/LEFT HEART CATH AND CORONARY ANGIOGRAPHY: CATH118266

## 2022-11-26 LAB — POCT I-STAT EG7
Acid-base deficit: 1 mmol/L (ref 0.0–2.0)
Acid-base deficit: 1 mmol/L (ref 0.0–2.0)
Bicarbonate: 23.4 mmol/L (ref 20.0–28.0)
Bicarbonate: 23.6 mmol/L (ref 20.0–28.0)
Calcium, Ion: 1.29 mmol/L (ref 1.15–1.40)
Calcium, Ion: 1.27 mmol/L (ref 1.15–1.40)
HCT: 33 % — ABNORMAL LOW (ref 36.0–46.0)
HCT: 34 % — ABNORMAL LOW (ref 36.0–46.0)
Hemoglobin: 11.2 g/dL — ABNORMAL LOW (ref 12.0–15.0)
Hemoglobin: 11.6 g/dL — ABNORMAL LOW (ref 12.0–15.0)
O2 Saturation: 70 %
O2 Saturation: 70 %
Potassium: 3.9 mmol/L (ref 3.5–5.1)
Potassium: 3.9 mmol/L (ref 3.5–5.1)
Sodium: 140 mmol/L (ref 135–145)
Sodium: 139 mmol/L (ref 135–145)
TCO2: 25 mmol/L (ref 22–32)
TCO2: 25 mmol/L (ref 22–32)
pCO2, Ven: 39 mmHg — ABNORMAL LOW (ref 44–60)
pCO2, Ven: 39 mmHg — ABNORMAL LOW (ref 44–60)
pH, Ven: 7.386 (ref 7.25–7.43)
pH, Ven: 7.39 (ref 7.25–7.43)
pO2, Ven: 37 mmHg (ref 32–45)
pO2, Ven: 37 mmHg (ref 32–45)

## 2022-11-26 LAB — CBC WITH DIFFERENTIAL/PLATELET
Abs Immature Granulocytes: 0.01 10*3/uL (ref 0.00–0.07)
Basophils Absolute: 0 10*3/uL (ref 0.0–0.1)
Basophils Relative: 0 %
Eosinophils Absolute: 0.3 10*3/uL (ref 0.0–0.5)
Eosinophils Relative: 5 %
HCT: 33.4 % — ABNORMAL LOW (ref 36.0–46.0)
Hemoglobin: 10.5 g/dL — ABNORMAL LOW (ref 12.0–15.0)
Immature Granulocytes: 0 %
Lymphocytes Relative: 19 %
Lymphs Abs: 1.1 10*3/uL (ref 0.7–4.0)
MCH: 29.9 pg (ref 26.0–34.0)
MCHC: 31.4 g/dL (ref 30.0–36.0)
MCV: 95.2 fL (ref 80.0–100.0)
Monocytes Absolute: 0.6 10*3/uL (ref 0.1–1.0)
Monocytes Relative: 10 %
Neutro Abs: 3.7 10*3/uL (ref 1.7–7.7)
Neutrophils Relative %: 66 %
Platelets: 136 10*3/uL — ABNORMAL LOW (ref 150–400)
RBC: 3.51 MIL/uL — ABNORMAL LOW (ref 3.87–5.11)
RDW: 13.3 % (ref 11.5–15.5)
WBC: 5.6 10*3/uL (ref 4.0–10.5)
nRBC: 0 % (ref 0.0–0.2)

## 2022-11-26 LAB — POCT I-STAT 7, (LYTES, BLD GAS, ICA,H+H)
Acid-base deficit: 2 mmol/L (ref 0.0–2.0)
Bicarbonate: 22.5 mmol/L (ref 20.0–28.0)
Calcium, Ion: 1.29 mmol/L (ref 1.15–1.40)
HCT: 33 % — ABNORMAL LOW (ref 36.0–46.0)
Hemoglobin: 11.2 g/dL — ABNORMAL LOW (ref 12.0–15.0)
O2 Saturation: 96 %
Potassium: 3.9 mmol/L (ref 3.5–5.1)
Sodium: 139 mmol/L (ref 135–145)
TCO2: 24 mmol/L (ref 22–32)
pCO2 arterial: 37.2 mmHg (ref 32–48)
pH, Arterial: 7.39 (ref 7.35–7.45)
pO2, Arterial: 82 mmHg — ABNORMAL LOW (ref 83–108)

## 2022-11-26 LAB — BASIC METABOLIC PANEL
Anion gap: 8 (ref 5–15)
BUN: 21 mg/dL (ref 8–23)
CO2: 22 mmol/L (ref 22–32)
Calcium: 9.4 mg/dL (ref 8.9–10.3)
Chloride: 107 mmol/L (ref 98–111)
Creatinine, Ser: 0.97 mg/dL (ref 0.44–1.00)
GFR, Estimated: >60 mL/min (ref >60)
Glucose, Bld: 94 mg/dL (ref 70–99)
Potassium: 4 mmol/L (ref 3.5–5.1)
Sodium: 137 mmol/L (ref 135–145)

## 2022-11-26 LAB — MAGNESIUM: Magnesium: 2.2 mg/dL (ref 1.7–2.4)

## 2022-11-26 LAB — POCT ACTIVATED CLOTTING TIME: Activated Clotting Time: 298 seconds

## 2022-11-26 SURGERY — RIGHT/LEFT HEART CATH AND CORONARY ANGIOGRAPHY
Anesthesia: LOCAL

## 2022-11-26 MED ORDER — HEPARIN (PORCINE) IN NACL 1000-0.9 UT/500ML-% IV SOLN
INTRAVENOUS | Status: AC
  Filled 2022-11-26: qty 1000

## 2022-11-26 MED ORDER — CLOPIDOGREL BISULFATE 300 MG PO TABS
ORAL_TABLET | ORAL | Status: DC | PRN
  Administered 2022-11-26: 600 mg via ORAL

## 2022-11-26 MED ORDER — SODIUM CHLORIDE 0.9 % IV SOLN: 250.0000 mL | INTRAVENOUS | Status: DC | PRN

## 2022-11-26 MED ORDER — LABETALOL HCL 5 MG/ML IV SOLN: 10.0000 mg | INTRAVENOUS | Status: AC | PRN

## 2022-11-26 MED ORDER — ONDANSETRON HCL 4 MG/2ML IJ SOLN
INTRAMUSCULAR | Status: AC
  Filled 2022-11-26: qty 2

## 2022-11-26 MED ORDER — IOHEXOL 350 MG/ML SOLN
INTRAVENOUS | Status: DC | PRN
  Administered 2022-11-26: 80 mL

## 2022-11-26 MED ORDER — ONDANSETRON HCL 4 MG/2ML IJ SOLN
4.0000 mg | Freq: Four times a day (QID) | INTRAMUSCULAR | Status: DC | PRN
  Administered 2022-11-26: 4 mg via INTRAVENOUS

## 2022-11-26 MED ORDER — HEPARIN SODIUM (PORCINE) 1000 UNIT/ML IJ SOLN
INTRAMUSCULAR | Status: AC
  Filled 2022-11-26: qty 10

## 2022-11-26 MED ORDER — LIDOCAINE HCL (PF) 1 % IJ SOLN
INTRAMUSCULAR | Status: AC
  Filled 2022-11-26: qty 30

## 2022-11-26 MED ORDER — NITROGLYCERIN 1 MG/10 ML FOR IR/CATH LAB
INTRA_ARTERIAL | Status: DC | PRN
  Administered 2022-11-26 (×2): 200 ug via INTRACORONARY
  Administered 2022-11-26: 300 ug via INTRA_ARTERIAL
  Administered 2022-11-26: 200 ug via INTRACORONARY

## 2022-11-26 MED ORDER — VERAPAMIL HCL 2.5 MG/ML IV SOLN
INTRAVENOUS | Status: AC
  Filled 2022-11-26: qty 2

## 2022-11-26 MED ORDER — SODIUM CHLORIDE 0.9% FLUSH
3.0000 mL | Freq: Two times a day (BID) | INTRAVENOUS | Status: DC
  Administered 2022-11-26: 3 mL via INTRAVENOUS

## 2022-11-26 MED ORDER — HYDRALAZINE HCL 20 MG/ML IJ SOLN
INTRAMUSCULAR | Status: AC
  Filled 2022-11-26: qty 1

## 2022-11-26 MED ORDER — HYDRALAZINE HCL 20 MG/ML IJ SOLN
10.0000 mg | INTRAMUSCULAR | Status: AC | PRN
  Administered 2022-11-26: 10 mg via INTRAVENOUS

## 2022-11-26 MED ORDER — HEPARIN (PORCINE) IN NACL 1000-0.9 UT/500ML-% IV SOLN
INTRAVENOUS | Status: DC | PRN
  Administered 2022-11-26 (×2): 500 mL

## 2022-11-26 MED ORDER — FENTANYL CITRATE (PF) 100 MCG/2ML IJ SOLN
INTRAMUSCULAR | Status: AC
  Filled 2022-11-26: qty 2

## 2022-11-26 MED ORDER — ACETAMINOPHEN 325 MG PO TABS: 650.0000 mg | ORAL_TABLET | ORAL | Status: DC | PRN

## 2022-11-26 MED ORDER — CLOPIDOGREL BISULFATE 75 MG PO TABS
75.0000 mg | ORAL_TABLET | Freq: Every day | ORAL | Status: DC
  Administered 2022-11-27: 75 mg via ORAL
  Filled 2022-11-26: qty 1

## 2022-11-26 MED ORDER — FENTANYL CITRATE (PF) 100 MCG/2ML IJ SOLN
INTRAMUSCULAR | Status: DC | PRN
  Administered 2022-11-26 (×2): 25 ug via INTRAVENOUS

## 2022-11-26 MED ORDER — NITROGLYCERIN 1 MG/10 ML FOR IR/CATH LAB
INTRA_ARTERIAL | Status: AC
  Filled 2022-11-26: qty 10

## 2022-11-26 MED ORDER — SODIUM CHLORIDE 0.9 % IV SOLN
INTRAVENOUS | Status: DC | PRN
  Administered 2022-11-26: 10 mL/h via INTRAVENOUS

## 2022-11-26 MED ORDER — VERAPAMIL HCL 2.5 MG/ML IV SOLN
INTRAVENOUS | Status: DC | PRN
  Administered 2022-11-26: 10 mL via INTRA_ARTERIAL

## 2022-11-26 MED ORDER — HEPARIN SODIUM (PORCINE) 1000 UNIT/ML IJ SOLN
INTRAMUSCULAR | Status: DC | PRN
  Administered 2022-11-26: 2500 [IU] via INTRAVENOUS
  Administered 2022-11-26: 3000 [IU] via INTRAVENOUS
  Administered 2022-11-26: 2000 [IU] via INTRAVENOUS

## 2022-11-26 MED ORDER — ASPIRIN 81 MG PO CHEW
81.0000 mg | CHEWABLE_TABLET | Freq: Every day | ORAL | Status: DC
  Administered 2022-11-27: 81 mg via ORAL
  Filled 2022-11-26: qty 1

## 2022-11-26 MED ORDER — SODIUM CHLORIDE 0.9% FLUSH: 3.0000 mL | INTRAVENOUS | Status: DC | PRN

## 2022-11-26 MED ORDER — MIDAZOLAM HCL 2 MG/2ML IJ SOLN
INTRAMUSCULAR | Status: DC | PRN
  Administered 2022-11-26 (×2): 1 mg via INTRAVENOUS

## 2022-11-26 MED ORDER — MIDAZOLAM HCL 2 MG/2ML IJ SOLN
INTRAMUSCULAR | Status: AC
  Filled 2022-11-26: qty 2

## 2022-11-26 MED ORDER — LIDOCAINE HCL (PF) 1 % IJ SOLN
INTRAMUSCULAR | Status: DC | PRN
  Administered 2022-11-26 (×2): 2 mL via INTRADERMAL

## 2022-11-26 SURGICAL SUPPLY — 25 items
BALL SAPPHIRE NC24 3.5X12 (BALLOONS) ×1
BALLN EMERGE MR 2.25X15 (BALLOONS) ×1
BALLOON EMERGE MR 2.25X15 (BALLOONS) IMPLANT
BALLOON SAPPHIRE NC24 3.5X12 (BALLOONS) IMPLANT
CATH 5FR JL3.5 JR4 ANG PIG MP (CATHETERS) IMPLANT
CATH LAUNCHER 6FR JR4 (CATHETERS) IMPLANT
CATH SWAN GANZ 7F STRAIGHT (CATHETERS) IMPLANT
DEVICE RAD COMP TR BAND LRG (VASCULAR PRODUCTS) IMPLANT
DEVICE RAD TR BAND REGULAR (VASCULAR PRODUCTS) IMPLANT
GLIDESHEATH SLEND SS 6F .021 (SHEATH) IMPLANT
GLIDESHEATH SLENDER 7FR .021G (SHEATH) IMPLANT
GUIDEWIRE INQWIRE 1.5J.035X260 (WIRE) IMPLANT
INQWIRE 1.5J .035X260CM (WIRE) ×1
KIT ENCORE 26 ADVANTAGE (KITS) IMPLANT
KIT HEART LEFT (KITS) ×1 IMPLANT
PACK CARDIAC CATHETERIZATION (CUSTOM PROCEDURE TRAY) ×1 IMPLANT
SHEATH PROBE COVER 6X72 (BAG) IMPLANT
STENT SYNERGY XD 3.0X20 (Permanent Stent) IMPLANT
SYNERGY XD 3.0X20 (Permanent Stent) ×1 IMPLANT
TRANSDUCER W/STOPCOCK (MISCELLANEOUS) ×1 IMPLANT
TUBING CIL FLEX 10 FLL-RA (TUBING) ×1 IMPLANT
VALVE GUARDIAN II ~~LOC~~ HEMO (MISCELLANEOUS) IMPLANT
WIRE ASAHI PROWATER 180CM (WIRE) IMPLANT
WIRE ASAHI PROWATER 300CM (WIRE) IMPLANT
WIRE HI TORQ VERSACORE-J 145CM (WIRE) IMPLANT

## 2022-11-26 NOTE — Progress Notes (Signed)
Patient stated that she can now move her fingers and has feeling back in her hand.  She is comfortable and resting well. Dr. Irish Lack is aware.

## 2022-11-26 NOTE — Progress Notes (Signed)
   Heart Failure Stewardship Pharmacist Progress Note   PCP: Ladell Pier, MD PCP-Cardiologist: None    HPI:  66 yo M with PMH of CVA, glaucoma, T2DM, breast cancer s/p mastectomy, and HTN.  She presented to the ED on 12/21 with shortness of breath, orthopnea, and hypertensive emergency. CXR with cardiomegaly with central pulmonary vascular congestion, central interstitial and hazy opacities, likely pulmonary edema. ECHO 12/22 showed LVEF 30-35%, moderate LVH, G1DD, RV mildly reduced, trivial MR. Coronary CTA cancelled due to scheduling conflicts. R/LHC on 12/26 showed 75% stenosis in mid RCA, RA 1, PA 20, wedge 5, CO 5, CI 3.5. S/p PCI with DES to RCA.   Current HF Medications: Beta Blocker: carvedilol 6.25 mg BID ACE/ARB/ARNI: losartan 12.5 mg daily  Prior to admission HF Medications: None  Pertinent Lab Values: Serum creatinine 0.97, BUN 21, Potassium 3.9, Sodium 140, Magnesium 2.2, BNP 1542.9, A1c 5.7   Vital Signs: Weight: 99 lbs Blood pressure: 150-180/60s  Heart rate: 60-70s   Medication Assistance / Insurance Benefits Check: Does the patient have prescription insurance?  Yes Type of insurance plan: [place] [employer]  Outpatient Pharmacy:  [facility] to admission [facility] Is the patient willing to use [facility] at discharge? Yes    Assessment: 1. Acute systolic CHF (LVEF 36-46%), pending ischemic evaluation. NYHA class III symptoms. - Off IV lasix. Euvolemic on cath. Strict I/Os and daily weights. Keep K>4 and mag>2. - Continue carvedilol 6.25 mg BID - History of angioedema on lisinopril, continue losartan 12.5 mg daily - AKI resolved. Consider adding spironolactone 25 mg daily and SGLT2i prior to discharge - Can transition off amlodipine to allow for aggressive titration of GDMT   Plan: 1) Medication changes recommended at this time: - Start spironolactone 25 mg daily  - Stop amlodipine 10 mg daily  2) Patient assistance: -  Jardiance/Farxiga copay $47  3)  Education  - To be completed prior to discharge  Kerby Nora, PharmD, BCPS Heart Failure Cytogeneticist Phone [phone]

## 2022-11-26 NOTE — H&P (View-Only) (Signed)
Rounding Note    Patient Name: Amy Howell Date of Encounter: 11/26/2022  [place] Cardiologist: None   Subjective   No chest pain or dyspnea  Inpatient Medications    Scheduled Meds:  amLODipine  10 mg Oral Daily   aspirin EC  81 mg Oral Daily   carvedilol  6.25 mg Oral BID WC   dorzolamide  1 drop Both Eyes BID   enoxaparin (LOVENOX) injection  30 mg Subcutaneous Q24H   feeding supplement (KATE FARMS STANDARD 1.4)  325 mL Oral BID BM   hydrocortisone cream   Topical BID   insulin aspart  0-15 Units Subcutaneous TID WC   insulin aspart  0-5 Units Subcutaneous QHS   latanoprost  1 drop Both Eyes QHS   losartan  12.5 mg Oral Daily   multivitamin with minerals  1 tablet Oral Daily   nicotine  14 mg Transdermal Daily   sodium chloride flush  3 mL Intravenous Q12H   sodium chloride flush  3 mL Intravenous Q12H   ticagrelor  45 mg Oral BID   timolol  1 drop Both Eyes Daily   Continuous Infusions:  sodium chloride     sodium chloride 10 mL/hr at 11/26/22 0505   PRN Meds: sodium chloride, acetaminophen **OR** acetaminophen, bisacodyl, hydrALAZINE, ondansetron **OR** ondansetron (ZOFRAN) IV, polyethylene glycol, sodium chloride flush, traZODone   Vital Signs    Vitals:   11/25/22 2004 11/26/22 0100 11/26/22 0450 11/26/22 0742  BP: 122/61  127/83 (!) 159/74  Pulse: 75  87 81  Resp: (!) '22  18 16  '$ Temp: 98.3 F (36.8 C)  98.9 F (37.2 C) 98.4 F (36.9 C)  TempSrc: Oral  Oral Oral  SpO2: 98%  99% 99%  Weight:  45.2 kg    Height:        Intake/Output Summary (Last 24 hours) at 11/26/2022 0844 Last data filed at 11/26/2022 0505 Gross per 24 hour  Intake 712 ml  Output --  Net 712 ml      11/26/2022    1:00 AM 11/25/2022    5:00 AM 11/23/2022    4:48 AM  Last 3 Weights  Weight (lbs) 99 lb 11.2 oz 99 lb 6.4 oz 96 lb 6.4 oz  Weight (kg) 45.224 kg 45.088 kg 43.727 kg      Telemetry    sinus - Personally Reviewed  ECG    No am ekg -  Personally Reviewed  Physical Exam   GEN: No acute distress.   Neck: No JVD Cardiac: RRR, no murmurs, rubs, or gallops.  Respiratory: Clear to auscultation bilaterally. GI: Soft, nontender, non-distended  MS: No edema; No deformity. Neuro:  Nonfocal  Psych: Normal affect   Labs    High Sensitivity Troponin:   Recent Labs  Lab 11/21/22 0135 11/21/22 0416 11/21/22 0940 11/21/22 1228  TROPONINIHS 8 37* 80* 79*     Chemistry Recent Labs  Lab 11/21/22 0135 11/21/22 0142 11/24/22 0037 11/25/22 0049 11/26/22 0051  NA 135   < > 135 137 137  K 4.0   < > 3.7 4.1 4.0  CL 105   < > 100 105 107  CO2 17*   < > '24 24 22  '$ GLUCOSE 293*   < > 92 98 94  BUN 20   < > 45* 35* 21  CREATININE 1.36*   < > 1.86* 1.22* 0.97  CALCIUM 8.8*   < > 9.6 9.8 9.4  MG  --    < >  2.3 2.3 2.2  PROT 7.5  --   --   --   --   ALBUMIN 3.5  --   --   --   --   AST 40  --   --   --   --   ALT 25  --   --   --   --   ALKPHOS 103  --   --   --   --   BILITOT 0.5  --   --   --   --   GFRNONAA 42*   < > 29* 48* >60  ANIONGAP 13   < > '11 8 8   '$ < > = values in this interval not displayed.    Lipids  Recent Labs  Lab 11/22/22 0609  CHOL 194  TRIG 80  HDL 43  LDLCALC 135*  CHOLHDL 4.5    Hematology Recent Labs  Lab 11/24/22 0037 11/25/22 0049 11/26/22 0051  WBC 5.7 6.7 5.6  RBC 3.88 3.73* 3.51*  HGB 11.5* 10.9* 10.5*  HCT 35.9* 35.2* 33.4*  MCV 92.5 94.4 95.2  MCH 29.6 29.2 29.9  MCHC 32.0 31.0 31.4  RDW 13.5 13.3 13.3  PLT 156 152 136*   Thyroid  Recent Labs  Lab 11/22/22 0609  TSH 0.506    BNP Recent Labs  Lab 11/21/22 0135 11/24/22 0037 11/25/22 0049  BNP 1,542.9* 191.4* 278.6*    DDimer No results for input(s): "DDIMER" in the last 168 hours.   Radiology    No results found.  Cardiac Studies     Patient Profile     66 y.o. female with a history of hypertension, DM 2, history of CVA and right breast cancer s/p mastectomy who is being seen 11/22/2022 for the  evaluation of acute systolic heart failure and abnormal echocardiogram.   Assessment & Plan    Acute systolic CHF/Cardiomyopathy: LVEF=30-35%. She has been diuresed. She is feeling well today. Plans in place for cardiac cath today. She is NPO. Continue ARB and beta blockers.   For questions or updates, please contact [place] Please consult [email or web address] for contact info under        Signed, Lauree Chandler, MD  11/26/2022, 8:44 AM

## 2022-11-26 NOTE — Progress Notes (Signed)
Rounding Note    Patient Name: Amy Howell Date of Encounter: 11/26/2022  [place] Cardiologist: None   Subjective   No chest pain or dyspnea  Inpatient Medications    Scheduled Meds:  amLODipine  10 mg Oral Daily   aspirin EC  81 mg Oral Daily   carvedilol  6.25 mg Oral BID WC   dorzolamide  1 drop Both Eyes BID   enoxaparin (LOVENOX) injection  30 mg Subcutaneous Q24H   feeding supplement (KATE FARMS STANDARD 1.4)  325 mL Oral BID BM   hydrocortisone cream   Topical BID   insulin aspart  0-15 Units Subcutaneous TID WC   insulin aspart  0-5 Units Subcutaneous QHS   latanoprost  1 drop Both Eyes QHS   losartan  12.5 mg Oral Daily   multivitamin with minerals  1 tablet Oral Daily   nicotine  14 mg Transdermal Daily   sodium chloride flush  3 mL Intravenous Q12H   sodium chloride flush  3 mL Intravenous Q12H   ticagrelor  45 mg Oral BID   timolol  1 drop Both Eyes Daily   Continuous Infusions:  sodium chloride     sodium chloride 10 mL/hr at 11/26/22 0505   PRN Meds: sodium chloride, acetaminophen **OR** acetaminophen, bisacodyl, hydrALAZINE, ondansetron **OR** ondansetron (ZOFRAN) IV, polyethylene glycol, sodium chloride flush, traZODone   Vital Signs    Vitals:   11/25/22 2004 11/26/22 0100 11/26/22 0450 11/26/22 0742  BP: 122/61  127/83 (!) 159/74  Pulse: 75  87 81  Resp: (!) '22  18 16  '$ Temp: 98.3 F (36.8 C)  98.9 F (37.2 C) 98.4 F (36.9 C)  TempSrc: Oral  Oral Oral  SpO2: 98%  99% 99%  Weight:  45.2 kg    Height:        Intake/Output Summary (Last 24 hours) at 11/26/2022 0844 Last data filed at 11/26/2022 0505 Gross per 24 hour  Intake 712 ml  Output --  Net 712 ml      11/26/2022    1:00 AM 11/25/2022    5:00 AM 11/23/2022    4:48 AM  Last 3 Weights  Weight (lbs) 99 lb 11.2 oz 99 lb 6.4 oz 96 lb 6.4 oz  Weight (kg) 45.224 kg 45.088 kg 43.727 kg      Telemetry    sinus - Personally Reviewed  ECG    No am ekg -  Personally Reviewed  Physical Exam   GEN: No acute distress.   Neck: No JVD Cardiac: RRR, no murmurs, rubs, or gallops.  Respiratory: Clear to auscultation bilaterally. GI: Soft, nontender, non-distended  MS: No edema; No deformity. Neuro:  Nonfocal  Psych: Normal affect   Labs    High Sensitivity Troponin:   Recent Labs  Lab 11/21/22 0135 11/21/22 0416 11/21/22 0940 11/21/22 1228  TROPONINIHS 8 37* 80* 79*     Chemistry Recent Labs  Lab 11/21/22 0135 11/21/22 0142 11/24/22 0037 11/25/22 0049 11/26/22 0051  NA 135   < > 135 137 137  K 4.0   < > 3.7 4.1 4.0  CL 105   < > 100 105 107  CO2 17*   < > '24 24 22  '$ GLUCOSE 293*   < > 92 98 94  BUN 20   < > 45* 35* 21  CREATININE 1.36*   < > 1.86* 1.22* 0.97  CALCIUM 8.8*   < > 9.6 9.8 9.4  MG  --    < >  2.3 2.3 2.2  PROT 7.5  --   --   --   --   ALBUMIN 3.5  --   --   --   --   AST 40  --   --   --   --   ALT 25  --   --   --   --   ALKPHOS 103  --   --   --   --   BILITOT 0.5  --   --   --   --   GFRNONAA 42*   < > 29* 48* >60  ANIONGAP 13   < > '11 8 8   '$ < > = values in this interval not displayed.    Lipids  Recent Labs  Lab 11/22/22 0609  CHOL 194  TRIG 80  HDL 43  LDLCALC 135*  CHOLHDL 4.5    Hematology Recent Labs  Lab 11/24/22 0037 11/25/22 0049 11/26/22 0051  WBC 5.7 6.7 5.6  RBC 3.88 3.73* 3.51*  HGB 11.5* 10.9* 10.5*  HCT 35.9* 35.2* 33.4*  MCV 92.5 94.4 95.2  MCH 29.6 29.2 29.9  MCHC 32.0 31.0 31.4  RDW 13.5 13.3 13.3  PLT 156 152 136*   Thyroid  Recent Labs  Lab 11/22/22 0609  TSH 0.506    BNP Recent Labs  Lab 11/21/22 0135 11/24/22 0037 11/25/22 0049  BNP 1,542.9* 191.4* 278.6*    DDimer No results for input(s): "DDIMER" in the last 168 hours.   Radiology    No results found.  Cardiac Studies     Patient Profile     66 y.o. female with a history of hypertension, DM 2, history of CVA and right breast cancer s/p mastectomy who is being seen 11/22/2022 for the  evaluation of acute systolic heart failure and abnormal echocardiogram.   Assessment & Plan    Acute systolic CHF/Cardiomyopathy: LVEF=30-35%. She has been diuresed. She is feeling well today. Plans in place for cardiac cath today. She is NPO. Continue ARB and beta blockers.   For questions or updates, please contact [place] Please consult [email or web address] for contact info under        Signed, Lauree Chandler, MD  11/26/2022, 8:44 AM

## 2022-11-26 NOTE — Progress Notes (Signed)
After patient received 10 mg of hydralizine her pressure dropped significally. She became light headed and nauses. Patient was placed in trendelenburg, received a 50 ml bolus, and 4 mg of zofran. She now states she is feeling better.

## 2022-11-26 NOTE — Progress Notes (Signed)
Cardiology PM Note: Right wrist cath site stable. Her arm is sore.  Will plan to monitor here overnight because of her arm pain.  Potential d/c home tomorrow.   Lauree Chandler, MD, [facility] 11/26/2022 2:40 PM

## 2022-11-26 NOTE — Progress Notes (Signed)
PROGRESS NOTE    Patient: Amy Howell                            PCP: Ladell Pier, MD                    DOB: December 11, 1955            DOA: 11/21/2022 [id number]             DOS: 11/26/2022, 2:17 PM   LOS: 4 days   Date of Service: The patient was seen and examined on 11/26/2022  Subjective:   The patient was seen and examined.  Medically stable denies have any chest pain or shortness of breath.  No issues overnight   Plan for left and right cardiac catheterization tomorrow 11/26/2022  Brief Narrative:   Ms. Kovack is a 66 yo female with PMH CVA, glaucoma, HTN who presented with shortness of breath.  She endorses difficulty with medications and not tolerating some medications therefore is hesitant on some of them at times.  She has not been taking blood pressure medication for this reason. She is compliant with her [name] which she says she has been on since 2018. On workup she was found to be in hypertensive crisis with blood pressure up to 188/112.  She was tachycardic and tachypneic.  She was placed on BiPAP initially due to work of breathing. Notable labs included increased creatinine (1.36; hard to know baseline given lack of labs in the past). BNP 1,542 and trop initially 8 which then trended upward (8 >> 37 >> 80).  Echo was ordered which showed reduced EF, 30 to 35% with wall motion abnormalities and grade 1 diastolic dysfunction.  Cardiology was consulted due to concern for underlying ischemia. She was also started on a nitroglycerin drip on admission and was transitioned to amlodipine after blood pressure responded.      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 as BP meds has been held - POA: BP 188/112; started on NTG gtt  >> off  -BP responsive to Norvasc, Coreg, losartan  -IV Lasix on hold  -With holding ACEi -due to history of allergy possibly angioedema    Acute on chronic combined systolic and diastolic CHF (congestive heart failure) (HCC) - Stable denies any shortness of breath, chest pain -Elevated BNP 1542 >> 191.4 >>> 278.6 >>  -Chest x-ray positive for congestion, pulmonary edema -was on  IV Lasix 40 mg twice daily>>> discontinued 11/24/2022  - Echo 03/21/2017 LVEF 60-65%, -  Repeat Echo LVEF 30-35%, multiple WMAs , grade 1 diastolic dysfunction, mild RV dysfunction   Intake/Output Summary (Last 24 hours) at 11/26/2022 1412 Last data filed at 11/26/2022 0505 Gross per 24 hour  Intake 472 ml  Output --  Net 472 ml    - Cardiology following closely -Discontinue CT coronary due to AKI  - GDMT likely to be initiated but patient does have ACEi allergy -angioedema -Due  to AKI, holding ARNI/Aldactone/SGLT2 inhibitor  -Per cardiology: Norvasc, carvedilol, and now losartan -Holding Lasix, diuretics, mildly overdiuresis, gentle IV fluid Improved kidney function  -Left/ Right cardiac Cath. 11/26/2022 per cardiology CHF/cardiomyopathy LVEF 30-35%-CAD  AKI (acute kidney injury) ([place]) - Holding Lasix -Avoiding nephrotoxins -Improving BUN/creatinine Lab Results  Component Value Date   CREATININE 0.97 11/26/2022   CREATININE 1.22 (H) 11/25/2022   CREATININE 1.86 (H) 11/24/2022        Cryptogenic stroke ([place]) - Stable no new focal neurological findings - April 2018, MRI Brain: Subacute infarcts in the bilateral occipital poles. Small more acute infarcts may be superimposed. Patient has other small remote posterior circulation  infarcts. 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 Estanislado Pandy - patient is compliant with asa and brilinta     Essential hypertension - Hypertensive this a.m. as p.o. meds has been held - Due to allergies medication added carefully -Patient started on Norvasc, Coreg, losartan,  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, right ([place]) - Underwent R breat mastectomy in 06/2022 with negative margins -She was given anastrozole but refused to take it after the first 3 doses made her somnolent -She is late for oncology f/u  Diabetes mellitus type 2 in nonobese ([place]) - Not on medications at home and patient has tried to control with diet and lifestyle changes - A1c 5.7%  Glaucoma of both eyes - Continue current eyedrops   -------------------------------------------------------------------------------------------------------------------------------------- Nutritional status:  The patient's BMI is: Body mass index is 18.84 kg/m. I agree with the assessment and plan as outlined   DVT prophylaxis:  enoxaparin (LOVENOX) injection 30 mg Start: 11/21/22 1000   Code Status:   Code Status: Full Code  Family Communication: Daughter at bedside-updated The above findings and plan of care has been discussed with patient (and family)  in detail,  they expressed understanding and agreement of above. -Advance care planning has been discussed.   Disposition:  from home anticipating discharging home in a.m. Pending CTA coronary, titration of current medication and monitoring creatinine  Admission status:   Status is: Inpatient Remains inpatient appropriate because: Titrating medication, cardiology evaluation, pending CTA coronary angiogram     Procedures:   No admission procedures for hospital  encounter.   Antimicrobials:  Anti-infectives (From admission, onward)    None        Medication:   [MAR Hold] amLODipine  10 mg Oral Daily   [START ON 11/27/2022] aspirin  81 mg Oral Daily   [MAR Hold] carvedilol  6.25 mg Oral BID WC   [START ON 11/27/2022] clopidogrel  75 mg Oral Q breakfast   [MAR Hold] dorzolamide  1 drop Both Eyes BID   [MAR Hold] enoxaparin (LOVENOX) injection  30 mg Subcutaneous Q24H   [MAR Hold] feeding supplement (KATE FARMS STANDARD 1.4)  325 mL Oral BID BM   [MAR Hold] hydrocortisone cream   Topical BID   [MAR Hold] insulin aspart  0-15 Units Subcutaneous TID WC   [MAR Hold] insulin aspart  0-5 Units Subcutaneous QHS   [MAR Hold] latanoprost  1 drop Both Eyes QHS   [MAR Hold] losartan  12.5 mg Oral Daily   [MAR Hold] multivitamin with minerals  1 tablet Oral Daily   [MAR Hold] nicotine  14 mg Transdermal Daily   [MAR Hold] sodium chloride flush  3 mL Intravenous Q12H   [  MAR Hold] sodium chloride flush  3 mL Intravenous Q12H   [MAR Hold] timolol  1 drop Both Eyes Daily    sodium chloride, sodium chloride, [MAR Hold] acetaminophen **OR** [MAR Hold] acetaminophen, [MAR Hold] bisacodyl, clopidogrel, fentaNYL, Heparin (Porcine) in NaCl, heparin sodium (porcine), hydrALAZINE, [MAR Hold] hydrALAZINE, iohexol, labetalol, lidocaine (PF), midazolam, nitroGLYCERIN, [MAR Hold] ondansetron **OR** [MAR Hold] ondansetron (ZOFRAN) IV, ondansetron (ZOFRAN) IV, [MAR Hold] polyethylene glycol, Radial Cocktail/Verapamil only, Radial Cocktail/Verapamil only, sodium chloride flush, [MAR Hold] traZODone   Objective:   Vitals:   11/26/22 1350 11/26/22 1355 11/26/22 1400 11/26/22 1404  BP: (!) 145/53   (!) 186/66  Pulse: 75 80 88 74  Resp: 20 20 (!) 21 18  Temp:      TempSrc:      SpO2: 100% 100% 100% 100%  Weight:      Height:        Intake/Output Summary (Last 24 hours) at 11/26/2022 1417 Last data filed at 11/26/2022 0505 Gross per 24 hour  Intake 472 ml   Output --  Net 472 ml    Filed Weights   11/23/22 0448 11/25/22 0500 11/26/22 0100  Weight: 43.7 kg 45.1 kg 45.2 kg     Examination:     General:  AAO x 3,  cooperative, no distress;   HEENT:  Normocephalic, PERRL, otherwise with in Normal limits   Neuro:  CNII-XII intact. , normal motor and sensation, reflexes intact   Lungs:   Clear to auscultation BL, Respirations unlabored,  No wheezes / crackles  Cardio:    S1/S2, RRR, No murmure, No Rubs or Gallops   Abdomen:  Soft, non-tender, bowel sounds active all four quadrants, no guarding or peritoneal signs.  Muscular  skeletal:  Limited exam -global generalized weaknesses - in bed, able to move all 4 extremities,   2+ pulses,  symmetric, No pitting edema  Skin:  Dry, warm to touch, negative for any Rashes,  Wounds: Please see nursing documentation         ------------------------------------------------------------------------------------------------------------------------------------------    LABs:     Latest Ref Rng & Units 11/26/2022    9:46 AM 11/26/2022   12:51 AM 11/25/2022   12:49 AM  CBC  WBC 4.0 - 10.5 K/uL  5.6  6.7   Hemoglobin 12.0 - 15.0 g/dL 11.2  10.5  10.9   Hematocrit 36.0 - 46.0 % 33.0  33.4  35.2   Platelets 150 - 400 K/uL  136  152       Latest Ref Rng & Units 11/26/2022    9:46 AM 11/26/2022   12:51 AM 11/25/2022   12:49 AM  CMP  Glucose 70 - 99 mg/dL  94  98   BUN 8 - 23 mg/dL  21  35   Creatinine 0.44 - 1.00 mg/dL  0.97  1.22   Sodium 135 - 145 mmol/L 140  137  137   Potassium 3.5 - 5.1 mmol/L 3.9  4.0  4.1   Chloride 98 - 111 mmol/L  107  105   CO2 22 - 32 mmol/L  22  24   Calcium 8.9 - 10.3 mg/dL  9.4  9.8        Micro Results Recent Results (from the past 240 hour(s))  Resp panel by RT-PCR (RSV, Flu A&B, Covid) Anterior Nasal Swab     Status: None   Collection Time: 11/21/22  2:16 AM   Specimen: Anterior Nasal Swab  Result Value Ref Range Status   SARS Coronavirus  2 by  RT PCR NEGATIVE NEGATIVE Final    Comment: (NOTE) SARS-CoV-2 target nucleic acids are NOT DETECTED.  The SARS-CoV-2 RNA is generally detectable in upper respiratory specimens during the acute phase of infection. The lowest concentration of SARS-CoV-2 viral copies this assay can detect is 138 copies/mL. A negative result does not preclude SARS-Cov-2 infection and should not be used as the sole basis for treatment or other patient management decisions. A negative result may occur with  improper specimen collection/handling, submission of specimen other than nasopharyngeal swab, presence of viral mutation(s) within the areas targeted by this assay, and inadequate number of viral copies(<138 copies/mL). A negative result must be combined with clinical observations, patient history, and epidemiological information. The expected result is Negative.  Fact Sheet for Patients:  EntrepreneurPulse.com.au  Fact Sheet for Healthcare Providers:  IncredibleEmployment.be  This test is no t yet approved or cleared by the [place] FDA and  has been authorized for detection and/or diagnosis of SARS-CoV-2 by FDA under an Emergency Use Authorization (EUA). This EUA will remain  in effect (meaning this test can be used) for the duration of the COVID-19 declaration under Section 564(b)(1) of the Act, 21 U.S.C.section 360bbb-3(b)(1), unless the authorization is terminated  or revoked sooner.       Influenza A by PCR NEGATIVE NEGATIVE Final   Influenza B by PCR NEGATIVE NEGATIVE Final    Comment: (NOTE) The Xpert Xpress SARS-CoV-2/FLU/RSV plus assay is intended as an aid in the diagnosis of influenza from Nasopharyngeal swab specimens and should not be used as a sole basis for treatment. Nasal washings and aspirates are unacceptable for Xpert Xpress SARS-CoV-2/FLU/RSV testing.  Fact Sheet for Patients: EntrepreneurPulse.com.au  Fact Sheet  for Healthcare Providers: IncredibleEmployment.be  This test is not yet approved or cleared by the [place] FDA and has been authorized for detection and/or diagnosis of SARS-CoV-2 by FDA under an Emergency Use Authorization (EUA). This EUA will remain in effect (meaning this test can be used) for the duration of the COVID-19 declaration under Section 564(b)(1) of the Act, 21 U.S.C. section 360bbb-3(b)(1), unless the authorization is terminated or revoked.     Resp Syncytial Virus by PCR NEGATIVE NEGATIVE Final    Comment: (NOTE) Fact Sheet for Patients: EntrepreneurPulse.com.au  Fact Sheet for Healthcare Providers: IncredibleEmployment.be  This test is not yet approved or cleared by the [place] FDA and has been authorized for detection and/or diagnosis of SARS-CoV-2 by FDA under an Emergency Use Authorization (EUA). This EUA will remain in effect (meaning this test can be used) for the duration of the COVID-19 declaration under Section 564(b)(1) of the Act, 21 U.S.C. section 360bbb-3(b)(1), unless the authorization is terminated or revoked.  Performed at [facility], [place] [address] [place], [place] [address]     Radiology Reports CARDIAC CATHETERIZATION  Result Date: 11/26/2022   1st Mrg lesion is 50% stenosed.   Mid RCA lesion is 75% stenosed.  A drug-eluting stent was successfully placed using a SYNERGY XD [id number], postdilated to greater than 3.5 mm.   Post intervention, there is a 0% residual stenosis.   There is moderate left ventricular systolic dysfunction.   LV end diastolic pressure is moderately elevated.   The left ventricular ejection fraction is 35-45% by visual estimate.   There is no aortic valve stenosis.   Right ulnar artery looks slightly larger.  There was some tortuosity and a branch vessel which the wire selected initially.  With a JR 4 catheter  versa core wire or was was directed more  meat daily and went into the radial artery.   Aortic saturation 96%, PA saturation 70%, PA pressure 29/12, mean PA pressure 20 mmHg, mean pulmonary capillary wedge pressure 5 mmHg, mean right atrial pressure 1 mmHg, cardiac output 5.02 L/min, cardiac index 3.58.   Normal right heart pressures. Successful PCI of the mid RCA.  Will change antiplatelet therapy to aspirin 81 mg daily and clopidogrel 75 mg daily.  Would have her stop the Brilinta 45 mg twice daily dose.  She will need aggressive secondary prevention.  She will need aggressive medical therapy for her LV dysfunction. Results conveyed to the patient's daughter, [name] [phone]    SIGNED: Deatra James, MD, FHM. [facility],  Pager (please use [email or web address] to page/text) Please use Epic Secure Chat for non-urgent communication (7AM-7PM)  If 7PM-7AM, please contact night-coverage [email or web address], 11/26/2022, 2:17 PM

## 2022-11-26 NOTE — Interval H&P Note (Signed)
Cath Lab Visit (complete for each Cath Lab visit)  Clinical Evaluation Leading to the Procedure:   ACS: Yes.    Non-ACS:    Anginal Classification: CCS IV  Anti-ischemic medical therapy: Minimal Therapy (1 class of medications)  Non-Invasive Test Results: High-risk stress test findings: cardiac mortality >3%/year  Prior CABG: No previous CABG  Low EF    History and Physical Interval Note:  11/26/2022 9:07 AM  Amy Howell  has presented today for surgery, with the diagnosis of HF.  The various methods of treatment have been discussed with the patient and family. After consideration of risks, benefits and other options for treatment, the patient has consented to  Procedure(s): RIGHT/LEFT HEART CATH AND CORONARY ANGIOGRAPHY (N/A) as a surgical intervention.  The patient's history has been reviewed, patient examined, no change in status, stable for surgery.  I have reviewed the patient's chart and labs.  Questions were answered to the patient's satisfaction.     Amy Howell

## 2022-11-26 NOTE — Progress Notes (Signed)
Radial band removed at 1530 from right radial site. Level 1, clean, dry dressing applied. Protocol followed per TR band removal. Patient able to move extremities.

## 2022-11-27 ENCOUNTER — Encounter (HOSPITAL_COMMUNITY): Payer: Self-pay | Admitting: Interventional Cardiology

## 2022-11-27 ENCOUNTER — Other Ambulatory Visit (HOSPITAL_COMMUNITY): Payer: Self-pay

## 2022-11-27 DIAGNOSIS — I10 Essential (primary) hypertension: Secondary | ICD-10-CM | POA: Diagnosis not present

## 2022-11-27 DIAGNOSIS — I428 Other cardiomyopathies: Secondary | ICD-10-CM

## 2022-11-27 DIAGNOSIS — I161 Hypertensive emergency: Secondary | ICD-10-CM | POA: Diagnosis not present

## 2022-11-27 DIAGNOSIS — I5043 Acute on chronic combined systolic (congestive) and diastolic (congestive) heart failure: Secondary | ICD-10-CM | POA: Diagnosis not present

## 2022-11-27 DIAGNOSIS — N179 Acute kidney failure, unspecified: Secondary | ICD-10-CM | POA: Diagnosis not present

## 2022-11-27 DIAGNOSIS — I214 Non-ST elevation (NSTEMI) myocardial infarction: Secondary | ICD-10-CM

## 2022-11-27 MED ORDER — NICOTINE 14 MG/24HR TD PT24
14.0000 mg | MEDICATED_PATCH | Freq: Every day | TRANSDERMAL | 0 refills | Status: DC
  Filled 2022-11-27: qty 28, 28d supply, fill #0

## 2022-11-27 MED ORDER — SPIRONOLACTONE 25 MG PO TABS
12.5000 mg | ORAL_TABLET | Freq: Every day | ORAL | 3 refills | Status: DC
  Filled 2022-11-27: qty 15, 30d supply, fill #0
  Filled 2022-12-23: qty 15, 30d supply, fill #1

## 2022-11-27 MED ORDER — SPIRONOLACTONE 12.5 MG HALF TABLET: 12.5000 mg | ORAL_TABLET | Freq: Every day | ORAL | Status: DC

## 2022-11-27 MED ORDER — ASPIRIN 81 MG PO CHEW
81.0000 mg | CHEWABLE_TABLET | Freq: Every day | ORAL | 3 refills | Status: DC
  Filled 2022-11-27: qty 30, 30d supply, fill #0
  Filled 2022-12-20: qty 90, 90d supply, fill #0
  Filled 2022-12-23: qty 30, 30d supply, fill #0

## 2022-11-27 MED ORDER — ROSUVASTATIN CALCIUM 20 MG PO TABS: 40.0000 mg | ORAL_TABLET | Freq: Every day | ORAL | Status: DC

## 2022-11-27 MED ORDER — ROSUVASTATIN CALCIUM 40 MG PO TABS
40.0000 mg | ORAL_TABLET | Freq: Every day | ORAL | 3 refills | Status: DC
  Filled 2022-11-27: qty 30, 30d supply, fill #0

## 2022-11-27 MED ORDER — CARVEDILOL 6.25 MG PO TABS
6.2500 mg | ORAL_TABLET | Freq: Two times a day (BID) | ORAL | 3 refills | Status: DC
  Filled 2022-11-27: qty 60, 30d supply, fill #0
  Filled 2022-12-23: qty 60, 30d supply, fill #1

## 2022-11-27 MED ORDER — LOSARTAN POTASSIUM 25 MG PO TABS
12.5000 mg | ORAL_TABLET | Freq: Every day | ORAL | 3 refills | Status: DC
  Filled 2022-11-27: qty 15, 30d supply, fill #0

## 2022-11-27 MED ORDER — CLOPIDOGREL BISULFATE 75 MG PO TABS
75.0000 mg | ORAL_TABLET | Freq: Every day | ORAL | 3 refills | Status: DC
  Filled 2022-11-27: qty 30, 30d supply, fill #0
  Filled 2022-12-23: qty 30, 30d supply, fill #1
  Filled 2023-01-20: qty 30, 30d supply, fill #0
  Filled 2023-02-21: qty 30, 30d supply, fill #1

## 2022-11-27 NOTE — Discharge Summary (Signed)
Physician Discharge Summary   Patient: Amy Howell MRN: [id number] DOB: [birth year]  Admit date:     11/21/2022  Discharge date: 11/27/22  Discharge Physician: Edwin Dada   PCP: Ladell Pier, MD     Recommendations at discharge:  Follow up with Cardiology for NSTEMI Follow up with PCP Karle Plumber for BP and diabetes management     Discharge Diagnoses: Principal Problem:   Non-ST elevation myocardial infarction Active Problems:   Acute on chronic combined systolic and diastolic CHF (congestive heart failure) (HCC)   Hypertensive emergency   AKI (acute kidney injury) ([place])   Essential hypertension   Cryptogenic stroke (HCC)   Glaucoma of both eyes   Diabetes mellitus type 2 in nonobese [facility])   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 F with cerebrovascular disease, DM, HTN who presented with 2 weeks orthopnea and dyspnea on exertion.  In the ER, found to have SpO2 70s, BNP 1500, BP >200 mmHg and CXR with congestion and cardiomegaly.  Admitted and started on Lasix.     CHF NSTEMI Patient admitted and started on Lasix.  Cardiology were consulted and took the patient to cath, which showed RCA stenosis, stented.  CHF symptoms resolved, dyspnea resolved, and she was discharged with Cardiology follow up.    AKI Resolved by the time of discharge.   Did the patient have an acute coronary syndrome (MI, NSTEMI, STEMI, etc) this admission?:  Yes                               AHA/ACC Clinical Performance & Quality Measures: Aspirin prescribed? - Yes ADP Receptor Inhibitor (Plavix/Clopidogrel, Brilinta/Ticagrelor or Effient/Prasugrel) prescribed (includes medically managed patients)? - Yes Beta Blocker prescribed? - Yes High Intensity Statin (Lipitor 40-38m or Crestor 20-450m prescribed? - Yes EF assessed during THIS hospitalization? - Yes For EF <40%, was  ACEI/ARB prescribed? - Yes For EF <40%, Aldosterone Antagonist (Spironolactone or Eplerenone) prescribed? - Yes Cardiac Rehab Phase II ordered (including medically managed patients)? - Yes                  [facility] was reviewed for this patient prior to discharge.  Consultants: Cardiology Procedures performed: Echocardiogram, left heart angiography  Disposition: Home Diet recommendation:  Discharge Diet Orders (From admission, onward)     Start     Ordered   11/27/22 0000  Diet - low sodium heart healthy        11/27/22 1058             DISCHARGE MEDICATION: Allergies as of 11/27/2022       Reactions   Milk-related Compounds Diarrhea   Beef-derived Products    Chicken Protein    Fish-derived Products    Lisinopril Other (See Comments)   Angioedema   Pork-derived Products         Medication List     STOP taking these medications    amLODipine 10 MG tablet Commonly known as: NORVASC   aspirin 325 MG tablet Replaced by: Aspirin Low Dose 81 MG chewable tablet   Brilinta 90 MG Tabs tablet Generic drug: ticagrelor       TAKE these medications    Aspirin Low Dose 81 MG chewable tablet Generic drug: aspirin Chew 1 tablet (81 mg total) by mouth daily. Start taking  on: November 28, 2022 Replaces: aspirin 325 MG tablet   carvedilol 6.25 MG tablet Commonly known as: COREG Take 1 tablet (6.25 mg total) by mouth 2 (two) times daily with a meal.   clopidogrel 75 MG tablet Commonly known as: PLAVIX Take 1 tablet (75 mg total) by mouth daily with breakfast. Start taking on: November 28, 2022   dorzolamide 2 % ophthalmic solution Commonly known as: TRUSOPT Instill 1 drop into both eyes twice a day   feeding supplement Liqd Take 237 mLs by mouth daily.   latanoprost 0.005 % ophthalmic solution Commonly known as: XALATAN Instill 1 drop into both eyes at bedtime   losartan 25 MG tablet Commonly known  as: COZAAR Take 1/2 tablet (12.5 mg total) by mouth daily. Start taking on: November 28, 2022   nicotine 14 mg/24hr patch Commonly known as: NICODERM CQ - dosed in mg/24 hours Place 1 patch (14 mg total) onto the skin daily. Start taking on: November 28, 2022   rosuvastatin 40 MG tablet Commonly known as: CRESTOR Take 1 tablet (40 mg total) by mouth daily.   spironolactone 25 MG tablet Commonly known as: ALDACTONE Take 1/2 tablet (12.5 mg total) by mouth daily.   timolol 0.5 % ophthalmic solution Commonly known as: TIMOPTIC Place 1 drop into both eyes in the morning.   triamcinolone cream 0.1 % Commonly known as: KENALOG Apply 1 application topically 2 (two) times daily. What changed:  when to take this reasons to take this        Follow-up Information     Ladell Pier, MD. Schedule an appointment as soon as possible for a visit in 1 week(s).   Specialty: Internal Medicine Contact information: [address] [place] [address] [phone]         [place]. Go in 13 day(s).   Specialty: Cardiology Why: Hospital follow up 12/10/2022 PLEASE bring a current medication list to appointment FREE valet parking, Entrance C, off [employer] information: [address] [id number] [place] [phone]                Discharge Instructions     Amb Referral to Cardiac Rehabilitation   Complete by: As directed    Diagnosis: Coronary Stents   After initial evaluation and assessments completed: Virtual Based Care may be provided alone or in conjunction with Phase 2 Cardiac Rehab based on patient barriers.: Yes   Intensive Cardiac Rehabilitation (ICR) [place] location only OR Traditional Cardiac Rehabilitation (TCR) *If criteria for ICR are not met will enroll in TCR [facility] only): Yes   Diet - low sodium heart healthy   Complete by: As directed    Discharge  instructions   Complete by: As directed    **IMPORTANT DISCHARGE INSTRUCTIONS**   From Dr. Loleta Books: You were admitted for a heart attack ("myocardial infarction") You had a heart cath that showed a blockage in the right coronary artery (one of the three main arteries that feeds the heart) You had a stent placed there  You should take aspirin 81 mg daily from now on You should take clopidogrel/Plavix 75 mg daily (This is similar to Ticagrelor/Brilinta, so if you have any Brilinta at home, do not take both, just take Plavix)  You should start the cholesterol medicine rosuvastatin/Crestor nightly before bed from now on  STOP amlodipine for blood pressure (it is being replaced by other heart protective medicines)  START the  heart protection (and blood pressure lowering) medicines losartan/Cozaar, carvedilol/Coreg and spironolactone/Aldactone  Go see your primary doctor in 1 week and get your labs checked  Go see the heart specialists in 10 days on Jan 3 (Appt with Nicholes Rough, listed below)   Increase activity slowly   Complete by: As directed        Discharge Exam: Filed Weights   11/25/22 0500 11/26/22 0100 11/27/22 0605  Weight: 45.1 kg 45.2 kg 44.5 kg    General: Pt is alert, awake, not in acute distress Cardiovascular: RRR, nl S1-S2, no murmurs appreciated.   No LE edema.   Respiratory: Normal respiratory rate and rhythm.  CTAB without rales or wheezes. Abdominal: Abdomen soft and non-tender.  No distension or HSM.   Neuro/Psych: Strength symmetric in upper and lower extremities.  Judgment and insight appear normal.   Condition at discharge: good  The results of significant diagnostics from this hospitalization (including imaging, microbiology, ancillary and laboratory) are listed below for reference.   Imaging Studies: CARDIAC CATHETERIZATION  Result Date: 11/26/2022   1st Mrg lesion is 50% stenosed.   Mid RCA lesion is 75% stenosed.  A drug-eluting stent was  successfully placed using a SYNERGY XD 3.0X20, postdilated to greater than 3.5 mm.   Post intervention, there is a 0% residual stenosis.   There is moderate left ventricular systolic dysfunction.   LV end diastolic pressure is moderately elevated.   The left ventricular ejection fraction is 35-45% by visual estimate.   There is no aortic valve stenosis.   Right ulnar artery looks slightly larger.  There was some tortuosity and a branch vessel which the wire selected initially.  With a JR 4 catheter versa core wire or was was directed more meat daily and went into the radial artery.   Aortic saturation 96%, PA saturation 70%, PA pressure 29/12, mean PA pressure 20 mmHg, mean pulmonary capillary wedge pressure 5 mmHg, mean right atrial pressure 1 mmHg, cardiac output 5.02 L/min, cardiac index 3.58.   Normal right heart pressures. Successful PCI of the mid RCA.  Will change antiplatelet therapy to aspirin 81 mg daily and clopidogrel 75 mg daily.  Would have her stop the Brilinta 45 mg twice daily dose.  She will need aggressive secondary prevention.  She will need aggressive medical therapy for her LV dysfunction. Results conveyed to the patient's daughter, [name] [phone]   ECHOCARDIOGRAM COMPLETE  Result Date: 11/22/2022    ECHOCARDIOGRAM REPORT   Patient Name:   Cassanda Walmer Date of Exam: 11/22/2022 Medical Rec #:  [id number]     Height:       61.0 in Accession #:    [id number]    Weight:       100.0 lb Date of Birth:  02-26-1956      BSA:          1.407 m Patient Age:    21 years      BP:           148/82 mmHg Patient Gender: F             HR:           71 bpm. Exam Location:  Inpatient Procedure: 2D Echo, Cardiac Doppler, Color Doppler and Strain Analysis Indications:    CHF-Acute Systolic 176.16/W73.71  History:        Patient has prior history of Echocardiogram examinations, most                 recent  03/27/2017. Pacemaker, Stroke; Risk Factors:Hypertension,                 Diabetes and Current Smoker.  Breast Cancer.  Sonographer:    Ronny Flurry Referring Phys: Karmen Bongo IMPRESSIONS  1. Global hypokinesis; worse in the Inferior/inferolateral wall from base to apex; GLS worse in the lateral wall; c/f possible left circumflex disease. Left ventricular ejection fraction, by estimation, is 30 to 35%. The left ventricle has moderately decreased function. There is moderate asymmetric left ventricular hypertrophy of the basal-septal segment. Left ventricular diastolic parameters are consistent with Grade I diastolic dysfunction (impaired relaxation).  2. Right ventricular systolic function is mildly reduced. The right ventricular size is normal. Tricuspid regurgitation signal is inadequate for assessing PA pressure.  3. Left atrial size was moderately dilated.  4. Trivial mitral valve regurgitation.  5. The aortic valve was not well visualized. Aortic valve regurgitation is mild to moderate. Aortic valve sclerosis is present, with no evidence of aortic valve stenosis.  6. The inferior vena cava is normal in size with greater than 50% respiratory variability, suggesting right atrial pressure of 3 mmHg. Conclusion(s)/Recommendation(s): Ef reported normal on prior TTE in 2018. With WMA above, recommend an ischemic evaluation. FINDINGS  Left Ventricle: Global hypokinesis; worse in the Inferior/inferolateral wall from base to apex; GLS worse in the lateral wall; c/f possible left circumflex disease. Left ventricular ejection fraction, by estimation, is 30 to 35%. The left ventricle has moderately decreased function. The left ventricular internal cavity size was normal in size. There is moderate asymmetric left ventricular hypertrophy of the basal-septal segment. Left ventricular diastolic parameters are consistent with Grade I diastolic dysfunction (impaired relaxation). Right Ventricle: The right ventricular size is normal. Right ventricular systolic function is mildly reduced. Tricuspid regurgitation signal is  inadequate for assessing PA pressure. Left Atrium: Left atrial size was moderately dilated. Right Atrium: Right atrial size was normal in size. Pericardium: There is no evidence of pericardial effusion. Mitral Valve: Trivial mitral valve regurgitation. Tricuspid Valve: Tricuspid valve regurgitation is not demonstrated. Aortic Valve: The aortic valve was not well visualized. Aortic valve regurgitation is mild to moderate. Aortic regurgitation PHT measures 490 msec. Aortic valve sclerosis is present, with no evidence of aortic valve stenosis. Aortic valve mean gradient measures 5.0 mmHg. Aortic valve peak gradient measures 9.7 mmHg. Aortic valve area, by VTI measures 2.51 cm. Pulmonic Valve: Pulmonic valve regurgitation is not visualized. Aorta: The aortic root and ascending aorta are structurally normal, with no evidence of dilitation. Venous: The inferior vena cava is normal in size with greater than 50% respiratory variability, suggesting right atrial pressure of 3 mmHg. IAS/Shunts: No atrial level shunt detected by color flow Doppler.  LEFT VENTRICLE PLAX 2D LVIDd:         4.40 cm LVIDs:         3.70 cm LV PW:         1.60 cm LV IVS:        1.40 cm LVOT diam:     2.00 cm   3D Volume EF: LV SV:         68        3D EF:        26 % LV SV Index:   48        LV EDV:       162 ml LVOT Area:     3.14 cm  LV ESV:       119 ml  LV SV:        43 ml RIGHT VENTRICLE            IVC RV S prime:     8.05 cm/s  IVC diam: 1.20 cm TAPSE (M-mode): 1.2 cm LEFT ATRIUM             Index        RIGHT ATRIUM           Index LA diam:        2.40 cm 1.71 cm/m   RA Area:     10.90 cm LA Vol (A2C):   43.3 ml 30.78 ml/m  RA Volume:   26.50 ml  18.84 ml/m LA Vol (A4C):   48.2 ml 34.26 ml/m LA Biplane Vol: 46.3 ml 32.91 ml/m  AORTIC VALVE AV Area (Vmax):    2.71 cm AV Area (Vmean):   2.45 cm AV Area (VTI):     2.51 cm AV Vmax:           155.50 cm/s AV Vmean:          97.750 cm/s AV VTI:            0.270 m AV  Peak Grad:      9.7 mmHg AV Mean Grad:      5.0 mmHg LVOT Vmax:         134.00 cm/s LVOT Vmean:        76.350 cm/s LVOT VTI:          0.216 m LVOT/AV VTI ratio: 0.80 AI PHT:            490 msec  AORTA Ao Root diam: 2.50 cm Ao Asc diam:  3.50 cm MITRAL VALVE MV Area (PHT): 4.04 cm    SHUNTS MV Decel Time: 188 msec    Systemic VTI:  0.22 m MV E velocity: 49.00 cm/s  Systemic Diam: 2.00 cm MV A velocity: 90.80 cm/s MV E/A ratio:  0.54 Mary Scientist, physiological signed by Phineas Inches Signature Date/Time: 11/22/2022/11:22:54 AM    Final    DG Chest Port 1 View  Result Date: 11/21/2022 CLINICAL DATA:  Shortness of breath EXAM: PORTABLE CHEST 1 VIEW COMPARISON:  None. FINDINGS: The heart is enlarged. There central pulmonary vascular congestion. There central interstitial and hazy opacities bilaterally. There is no focal lung consolidation or pneumothorax. No acute fractures are seen. IMPRESSION: 1. Cardiomegaly with central pulmonary vascular congestion. 2. Central interstitial and hazy opacities bilaterally, likely pulmonary edema. Electronically Signed   By: Ronney Asters M.D.   On: 11/21/2022 01:43    Microbiology: Results for orders placed or performed during the hospital encounter of 11/21/22  Resp panel by RT-PCR (RSV, Flu A&B, Covid) Anterior Nasal Swab     Status: None   Collection Time: 11/21/22  2:16 AM   Specimen: Anterior Nasal Swab  Result Value Ref Range Status   SARS Coronavirus 2 by RT PCR NEGATIVE NEGATIVE Final    Comment: (NOTE) SARS-CoV-2 target nucleic acids are NOT DETECTED.  The SARS-CoV-2 RNA is generally detectable in upper respiratory specimens during the acute phase of infection. The lowest concentration of SARS-CoV-2 viral copies this assay can detect is 138 copies/mL. A negative result does not preclude SARS-Cov-2 infection and should not be used as the sole basis for treatment or other patient management decisions. A negative result may occur with  improper specimen  collection/handling, submission of specimen other than nasopharyngeal swab, presence of viral mutation(s) within the areas targeted by  this assay, and inadequate number of viral copies(<138 copies/mL). A negative result must be combined with clinical observations, patient history, and epidemiological information. The expected result is Negative.  Fact Sheet for Patients:  EntrepreneurPulse.com.au  Fact Sheet for Healthcare Providers:  IncredibleEmployment.be  This test is no t yet approved or cleared by the [place] FDA and  has been authorized for detection and/or diagnosis of SARS-CoV-2 by FDA under an Emergency Use Authorization (EUA). This EUA will remain  in effect (meaning this test can be used) for the duration of the COVID-19 declaration under Section 564(b)(1) of the Act, 21 U.S.C.section 360bbb-3(b)(1), unless the authorization is terminated  or revoked sooner.       Influenza A by PCR NEGATIVE NEGATIVE Final   Influenza B by PCR NEGATIVE NEGATIVE Final    Comment: (NOTE) The Xpert Xpress SARS-CoV-2/FLU/RSV plus assay is intended as an aid in the diagnosis of influenza from Nasopharyngeal swab specimens and should not be used as a sole basis for treatment. Nasal washings and aspirates are unacceptable for Xpert Xpress SARS-CoV-2/FLU/RSV testing.  Fact Sheet for Patients: EntrepreneurPulse.com.au  Fact Sheet for Healthcare Providers: IncredibleEmployment.be  This test is not yet approved or cleared by the [place] FDA and has been authorized for detection and/or diagnosis of SARS-CoV-2 by FDA under an Emergency Use Authorization (EUA). This EUA will remain in effect (meaning this test can be used) for the duration of the COVID-19 declaration under Section 564(b)(1) of the Act, 21 U.S.C. section 360bbb-3(b)(1), unless the authorization is terminated or revoked.     Resp Syncytial  Virus by PCR NEGATIVE NEGATIVE Final    Comment: (NOTE) Fact Sheet for Patients: EntrepreneurPulse.com.au  Fact Sheet for Healthcare Providers: IncredibleEmployment.be  This test is not yet approved or cleared by the [place] FDA and has been authorized for detection and/or diagnosis of SARS-CoV-2 by FDA under an Emergency Use Authorization (EUA). This EUA will remain in effect (meaning this test can be used) for the duration of the COVID-19 declaration under Section 564(b)(1) of the Act, 21 U.S.C. section 360bbb-3(b)(1), unless the authorization is terminated or revoked.  Performed at [facility], [place] [address] [place], [place] [address]     Labs: CBC: Recent Labs  Lab 11/22/22 [phone] 11/23/22 0057 11/24/22 0037 11/25/22 0049 11/26/22 0051 11/26/22 0937 11/26/22 0946  WBC 6.3 5.3 5.7 6.7 5.6  --   --   NEUTROABS  --  3.1 3.3 4.3 3.7  --   --   HGB 11.6* 12.2 11.5* 10.9* 10.5* 11.2* 11.6*  11.2*  HCT 35.1* 38.2 35.9* 35.2* 33.4* 33.0* 34.0*  33.0*  MCV 91.9 92.9 92.5 94.4 95.2  --   --   PLT 152 162 156 152 136*  --   --    Basic Metabolic Panel: Recent Labs  Lab 11/22/22 0609 11/23/22 0057 11/24/22 0037 11/25/22 0049 11/26/22 0051 11/26/22 0937 11/26/22 0946  NA 140 137 135 137 137 139 139  140  K 3.7 3.5 3.7 4.1 4.0 3.9 3.9  3.9  CL 105 99 100 105 107  --   --   CO2 _0 --   --   GLUCOSE 92 98 92 98 94  --   --   BUN 21 29* 45* 35* 21  --   --   CREATININE 1.17* 1.43* 1.86* 1.22* 0.97  --   --   CALCIUM 8.9 9.2 9.6 9.8 9.4  --   --  MG  --  1.9 2.3 2.3 2.2  --   --    Liver Function Tests: Recent Labs  Lab 11/21/22 0135  AST 40  ALT 25  ALKPHOS 103  BILITOT 0.5  PROT 7.5  ALBUMIN 3.5   CBG: Recent Labs  Lab 11/21/22 1226 11/21/22 1615 11/21/22 2058 11/22/22 0814 11/22/22 1653  GLUCAP 119* 95 107* 138* 84    Discharge time spent: approximately 35 minutes spent on discharge  counseling, evaluation of patient on day of discharge, and coordination of discharge planning with nursing, social work, pharmacy and case management  Signed: Edwin Dada, MD [facility] 11/27/2022

## 2022-11-27 NOTE — Care Management Important Message (Signed)
Important Message  Patient Details  Name: Amy Howell MRN: [id number] Date of Birth: 06-24-1956   Medicare Important Message Given:  Yes     Shelda Altes 11/27/2022, 8:24 AM

## 2022-11-27 NOTE — Progress Notes (Addendum)
Rounding Note    Patient Name: Amy Howell Date of Encounter: 11/27/2022  [facility] Cardiologist: None   Subjective   Feeling well this morning. No chest pain. Right arm swelling improved.   Inpatient Medications    Scheduled Meds:  amLODipine  10 mg Oral Daily   aspirin  81 mg Oral Daily   carvedilol  6.25 mg Oral BID WC   clopidogrel  75 mg Oral Q breakfast   dorzolamide  1 drop Both Eyes BID   enoxaparin (LOVENOX) injection  30 mg Subcutaneous Q24H   feeding supplement (KATE FARMS STANDARD 1.4)  325 mL Oral BID BM   hydrocortisone cream   Topical BID   insulin aspart  0-15 Units Subcutaneous TID WC   insulin aspart  0-5 Units Subcutaneous QHS   latanoprost  1 drop Both Eyes QHS   losartan  12.5 mg Oral Daily   multivitamin with minerals  1 tablet Oral Daily   nicotine  14 mg Transdermal Daily   sodium chloride flush  3 mL Intravenous Q12H   sodium chloride flush  3 mL Intravenous Q12H   sodium chloride flush  3 mL Intravenous Q12H   timolol  1 drop Both Eyes Daily   Continuous Infusions:  sodium chloride     PRN Meds: sodium chloride, acetaminophen, bisacodyl, hydrALAZINE, ondansetron **OR** ondansetron (ZOFRAN) IV, ondansetron (ZOFRAN) IV, polyethylene glycol, sodium chloride flush, traZODone   Vital Signs    Vitals:   11/26/22 2011 11/27/22 0605 11/27/22 0651 11/27/22 0739  BP: 120/60  (!) 140/65 127/66  Pulse: 89  67 77  Resp: (!) '21  18 14  '$ Temp: 100.2 F (37.9 C)  98.4 F (36.9 C) 98.1 F (36.7 C)  TempSrc: Oral  Oral Oral  SpO2: 100%  99% 98%  Weight:  44.5 kg    Height:        Intake/Output Summary (Last 24 hours) at 11/27/2022 0940 Last data filed at 11/26/2022 1600 Gross per 24 hour  Intake 51.71 ml  Output --  Net 51.71 ml      11/27/2022    6:05 AM 11/26/2022    1:00 AM 11/25/2022    5:00 AM  Last 3 Weights  Weight (lbs) 98 lb 3.2 oz 99 lb 11.2 oz 99 lb 6.4 oz  Weight (kg) 44.543 kg 45.224 kg 45.088 kg       Telemetry    Sinus Rhythm - Personally Reviewed  ECG    Sinus Rhythm, TWI in infero/anterolateral leads - Personally Reviewed  Physical Exam   GEN: No acute distress.   Neck: No JVD Cardiac: RRR, no murmurs, rubs, or gallops.  Respiratory: Clear to auscultation bilaterally. GI: Soft, nontender, non-distended  MS: No edema; No deformity. Right ulnar cath site stable, forearm swelling improved Neuro:  Nonfocal  Psych: Normal affect   Labs    High Sensitivity Troponin:   Recent Labs  Lab 11/21/22 0135 11/21/22 0416 11/21/22 0940 11/21/22 1228  TROPONINIHS 8 37* 80* 79*     Chemistry Recent Labs  Lab 11/21/22 0135 11/21/22 0142 11/24/22 0037 11/25/22 0049 11/26/22 0051 11/26/22 0937 11/26/22 0946  NA 135   < > 135 137 137 139 139  140  K 4.0   < > 3.7 4.1 4.0 3.9 3.9  3.9  CL 105   < > 100 105 107  --   --   CO2 17*   < > '24 24 22  '$ --   --   GLUCOSE  293*   < > 92 98 94  --   --   BUN 20   < > 45* 35* 21  --   --   CREATININE 1.36*   < > 1.86* 1.22* 0.97  --   --   CALCIUM 8.8*   < > 9.6 9.8 9.4  --   --   MG  --    < > 2.3 2.3 2.2  --   --   PROT 7.5  --   --   --   --   --   --   ALBUMIN 3.5  --   --   --   --   --   --   AST 40  --   --   --   --   --   --   ALT 25  --   --   --   --   --   --   ALKPHOS 103  --   --   --   --   --   --   BILITOT 0.5  --   --   --   --   --   --   GFRNONAA 42*   < > 29* 48* >60  --   --   ANIONGAP 13   < > '11 8 8  '$ --   --    < > = values in this interval not displayed.    Lipids  Recent Labs  Lab 11/22/22 0609  CHOL 194  TRIG 80  HDL 43  LDLCALC 135*  CHOLHDL 4.5    Hematology Recent Labs  Lab 11/24/22 0037 11/25/22 0049 11/26/22 0051 11/26/22 0937 11/26/22 0946  WBC 5.7 6.7 5.6  --   --   RBC 3.88 3.73* 3.51*  --   --   HGB 11.5* 10.9* 10.5* 11.2* 11.6*  11.2*  HCT 35.9* 35.2* 33.4* 33.0* 34.0*  33.0*  MCV 92.5 94.4 95.2  --   --   MCH 29.6 29.2 29.9  --   --   MCHC 32.0 31.0 31.4  --   --    RDW 13.5 13.3 13.3  --   --   PLT 156 152 136*  --   --    Thyroid  Recent Labs  Lab 11/22/22 0609  TSH 0.506    BNP Recent Labs  Lab 11/21/22 0135 11/24/22 0037 11/25/22 0049  BNP 1,542.9* 191.4* 278.6*    DDimer No results for input(s): "DDIMER" in the last 168 hours.   Radiology    CARDIAC CATHETERIZATION  Result Date: 11/26/2022   1st Mrg lesion is 50% stenosed.   Mid RCA lesion is 75% stenosed.  A drug-eluting stent was successfully placed using a SYNERGY XD 3.0X20, postdilated to greater than 3.5 mm.   Post intervention, there is a 0% residual stenosis.   There is moderate left ventricular systolic dysfunction.   LV end diastolic pressure is moderately elevated.   The left ventricular ejection fraction is 35-45% by visual estimate.   There is no aortic valve stenosis.   Right ulnar artery looks slightly larger.  There was some tortuosity and a branch vessel which the wire selected initially.  With a JR 4 catheter versa core wire or was was directed more meat daily and went into the radial artery.   Aortic saturation 96%, PA saturation 70%, PA pressure 29/12, mean PA pressure 20 mmHg, mean pulmonary capillary wedge pressure 5 mmHg, mean right atrial pressure  1 mmHg, cardiac output 5.02 L/min, cardiac index 3.58.   Normal right heart pressures. Successful PCI of the mid RCA.  Will change antiplatelet therapy to aspirin 81 mg daily and clopidogrel 75 mg daily.  Would have her stop the Brilinta 45 mg twice daily dose.  She will need aggressive secondary prevention.  She will need aggressive medical therapy for her LV dysfunction. Results conveyed to the patient's daughter, [name] [phone]    Cardiac Studies   Echo: 11/22/2022  IMPRESSIONS     1. Global hypokinesis; worse in the Inferior/inferolateral wall from base  to apex; GLS worse in the lateral wall; c/f possible left circumflex  disease. Left ventricular ejection fraction, by estimation, is 30 to 35%.  The left  ventricle has moderately  decreased function. There is moderate asymmetric left ventricular  hypertrophy of the basal-septal segment. Left ventricular diastolic  parameters are consistent with Grade I diastolic dysfunction (impaired  relaxation).   2. Right ventricular systolic function is mildly reduced. The right  ventricular size is normal. Tricuspid regurgitation signal is inadequate  for assessing PA pressure.   3. Left atrial size was moderately dilated.   4. Trivial mitral valve regurgitation.   5. The aortic valve was not well visualized. Aortic valve regurgitation  is mild to moderate. Aortic valve sclerosis is present, with no evidence  of aortic valve stenosis.   6. The inferior vena cava is normal in size with greater than 50%  respiratory variability, suggesting right atrial pressure of 3 mmHg.   Conclusion(s)/Recommendation(s): Ef reported normal on prior TTE in 2018.  With WMA above, recommend an ischemic evaluation.   FINDINGS   Left Ventricle: Global hypokinesis; worse in the Inferior/inferolateral  wall from base to apex; GLS worse in the lateral wall; c/f possible left  circumflex disease. Left ventricular ejection fraction, by estimation, is  30 to 35%. The left ventricle has  moderately decreased function. The left ventricular internal cavity size  was normal in size. There is moderate asymmetric left ventricular  hypertrophy of the basal-septal segment. Left ventricular diastolic  parameters are consistent with Grade I  diastolic dysfunction (impaired relaxation).   Right Ventricle: The right ventricular size is normal. Right ventricular  systolic function is mildly reduced. Tricuspid regurgitation signal is  inadequate for assessing PA pressure.   Left Atrium: Left atrial size was moderately dilated.   Right Atrium: Right atrial size was normal in size.   Pericardium: There is no evidence of pericardial effusion.   Mitral Valve: Trivial mitral valve  regurgitation.   Tricuspid Valve: Tricuspid valve regurgitation is not demonstrated.   Aortic Valve: The aortic valve was not well visualized. Aortic valve  regurgitation is mild to moderate. Aortic regurgitation PHT measures 490  msec. Aortic valve sclerosis is present, with no evidence of aortic valve  stenosis. Aortic valve mean gradient  measures 5.0 mmHg. Aortic valve peak gradient measures 9.7 mmHg. Aortic  valve area, by VTI measures 2.51 cm.   Pulmonic Valve: Pulmonic valve regurgitation is not visualized.   Aorta: The aortic root and ascending aorta are structurally normal, with  no evidence of dilitation.   Venous: The inferior vena cava is normal in size with greater than 50%  respiratory variability, suggesting right atrial pressure of 3 mmHg.   IAS/Shunts: No atrial level shunt detected by color flow Doppler.    Cath: 11/26/2022    1st Mrg lesion is 50% stenosed.   Mid RCA lesion is 75% stenosed.  A drug-eluting stent was  successfully placed using a SYNERGY XD 3.0X20, postdilated to greater than 3.5 mm.   Post intervention, there is a 0% residual stenosis.   There is moderate left ventricular systolic dysfunction.   LV end diastolic pressure is moderately elevated.   The left ventricular ejection fraction is 35-45% by visual estimate.   There is no aortic valve stenosis.   Right ulnar artery looks slightly larger.  There was some tortuosity and a branch vessel which the wire selected initially.  With a JR 4 catheter versa core wire or was was directed more meat daily and went into the radial artery.   Aortic saturation 96%, PA saturation 70%, PA pressure 29/12, mean PA pressure 20 mmHg, mean pulmonary capillary wedge pressure 5 mmHg, mean right atrial pressure 1 mmHg, cardiac output 5.02 L/min, cardiac index 3.58.   Normal right heart pressures.   Successful PCI of the mid RCA.  Will change antiplatelet therapy to aspirin 81 mg daily and clopidogrel 75 mg daily.   Would have her stop the Brilinta 45 mg twice daily dose.  She will need aggressive secondary prevention.  She will need aggressive medical therapy for her LV dysfunction.   Results conveyed to the patient's daughter, [name] [phone]   Diagnostic Dominance: Right  Intervention    Patient Profile     66 y.o. female with a history of hypertension, DM 2, history of CVA and right breast cancer s/p mastectomy who is being seen 11/22/2022 for the evaluation of acute systolic heart failure and abnormal echocardiogram.    Assessment & Plan    HFrEF -- presented with CHF, echo this admission showed decline in LVEF of 30-35% with multiple WMA, g1DD, and mild RV dysfunction. Diuresed with IV lasix. Did have a rise in her Cr, peak of 1.8 which improved to 0.97.  -- GDMT: continue coreg 6.'25mg'$  BID, losartan 12.'5mg'$  daily, add spiro 12.'5mg'$  daily   Elevated troponin -- hsTn 37>>80>>79 -- underwent cardiac cath noted above with successful PCI of the mRCA with recommendations for DAPT with ASA/plavix for at least 6 months -- continue ASA, statin, coreg, losartan, spiro  HTN -- initially elevated but now improved -- continue coreg 6.'25mg'$  BID, losartan 12.'5mg'$ , add spiro 12.'5mg'$  daily   DM -- hgb A1c 5.7 -- management per primary -- consider addition of SLGT2 as an outpatient  CVA -- asa, add statin  [place] will sign off.   Medication Recommendations:  noted above Other recommendations (labs, testing, etc):  N/a Follow up as an outpatient:  2 week TOC follow up/will arrange  For questions or updates, please contact [place] Please consult [email or web address] for contact info under        Signed, Reino Bellis, NP  11/27/2022, 9:40 AM    I have personally seen and examined this patient. I agree with the assessment and plan as outlined above.  She is doing well today post PCI. NSTEMI. RCA stent placed. Discharge on ASA and plavix for six months. Right arm cath  site ok. Medical therapy for cardiomyopathy with Coreg, Losartan and aldactone. Continue statin.  OK to d/c home today. We will arrange follow up in our office  Lauree Chandler, MD, [facility] 11/27/2022 10:31 AM

## 2022-11-27 NOTE — Progress Notes (Addendum)
   Heart Failure Stewardship Pharmacist Progress Note   PCP: Ladell Pier, MD PCP-Cardiologist: None    HPI:  66 yo M with PMH of CVA, glaucoma, T2DM, breast cancer s/p mastectomy, and HTN.  She presented to the ED on 12/21 with shortness of breath, orthopnea, and hypertensive emergency. CXR with cardiomegaly with central pulmonary vascular congestion, central interstitial and hazy opacities, likely pulmonary edema. ECHO 12/22 showed LVEF 30-35%, moderate LVH, G1DD, RV mildly reduced, trivial MR. Coronary CTA cancelled due to scheduling conflicts. R/LHC on 12/26 showed 75% stenosis in mid RCA, RA 1, PA 20, wedge 5, CO 5, CI 3.5. S/p PCI with DES to RCA.   Current HF Medications: Beta Blocker: carvedilol 6.25 mg BID ACE/ARB/ARNI: losartan 12.5 mg daily  Prior to admission HF Medications: None  Pertinent Lab Values: No labs yet 12/27 Serum creatinine 0.97, BUN 21, Potassium 3.9, Sodium 140, Magnesium 2.2, BNP 1542.9, A1c 5.7   Vital Signs: Weight: 98 lbs Blood pressure: 150/60s  Heart rate: 60-80s   Medication Assistance / Insurance Benefits Check: Does the patient have prescription insurance?  Yes Type of insurance plan: [place] [employer]  Outpatient Pharmacy:  [facility] admission [facility] Is the patient willing to use [place] pharmacy at discharge? Yes    Assessment: 1. Acute systolic CHF (LVEF 32-67%), due to ICM. NYHA class II symptoms. - Off IV lasix. Euvolemic on cath. Strict I/Os and daily weights. Keep K>4 and mag>2. - Continue carvedilol 6.25 mg BID - History of angioedema on lisinopril, continue losartan 12.5 mg daily - AKI resolved. Consider adding spironolactone 25 mg daily and SGLT2i prior to discharge - Can transition off amlodipine to allow for aggressive titration of GDMT   Plan: 1) Medication changes recommended at this time: - Start spironolactone 25 mg daily  - Stop amlodipine 10 mg daily  2) Patient assistance: -  Jardiance/Farxiga copay $47  3)  Education  - Patient has been educated on current HF medications and potential additions to HF medication regimen - Patient verbalizes understanding that over the next few months, these medication doses may change and more medications may be added to optimize HF regimen - Patient has been educated on basic disease state pathophysiology and goals of therapy   Kerby Nora, PharmD, BCPS Heart Failure Stewardship Pharmacist Phone [phone]

## 2022-11-27 NOTE — Progress Notes (Signed)
CARDIAC REHAB PHASE I   PRE:  Rate/Rhythm: 77 SR  BP:  Sitting: 127/66      SaO2: 96 RA  MODE:  Ambulation: 470 ft   POST:  Rate/Rhythm: 86 SR  BP:  Sitting: 149/56      SaO2: 98 RA   Pt ambulated in hall independently, tolerating well with no dizziness, SOB or CP. Returned to bed with call bell and bedside table in reach. Post stent/ HF education including site care, HF booklet, low sodium heart healthy diet, exercise guidelines, smoking cessation risk factors, restrictions, antiplatelet therapy importance and CRP2 reviewed. All questions and concerns addressed. Will refer to [facility] for CRP2. Pt is hopeful for discharge home today.   [phone]  Vanessa Barbara, RN BSN 11/27/2022 9:48 AM

## 2022-11-27 NOTE — Progress Notes (Signed)
Heart Failure Nurse Navigator Progress Note  PCP: Ladell Pier, MD PCP-Cardiologist: None Admission Diagnosis: Hypertensive emergency, Respiratory acidosis Admitted from: Home via EMS  Presentation:   Amy Howell presented with shortness of breath, found on floor, O2 on room air was 78 %, Cpap placed on patient, BP 211/113, HR 103, BNP 1,542, IV nitro glycerin started, CXr with cardiomegaly with central pulmonary vascular congestion. R/L HC on 12/26 showed 75 % stenosis in the mid RCA, PCI with DES to RCA.   Patient was educated on the sign and symptoms of heart failure, daily weights, diet/ fluid restrictions, taking all medications as prescribed an d attending all medical appointments , patient verbalized her understanding, a Hf TOC appointment was scheduled for 12/11/2022.   ECHO/ LVEF: 30-35%  Clinical Course:  Past Medical History:  Diagnosis Date   Cataracts, bilateral    Cerebral infarction due to carotid artery stenosis (HCC)    Diabetes mellitus type 2 in nonobese (HCC)    Glaucoma    Hypertension    Stroke ([place])    no residual, "series of mini strokes"   Tobacco abuse    Vertigo      Social History   Socioeconomic History   Marital status: Single    Spouse name: Not on file   Number of children: Not on file   Years of education: Not on file   Highest education level: Not on file  Occupational History   Occupation: retired  Tobacco Use   Smoking status: Every Day    Packs/day: 0.50    Years: 56.00    Total pack years: 28.00    Types: Cigarettes   Smokeless tobacco: Never  Vaping Use   Vaping Use: Never used  Substance and Sexual Activity   Alcohol use: No   Drug use: No   Sexual activity: Not Currently    Birth control/protection: Surgical    Comment: Hyst  Other Topics Concern   Not on file  Social History Narrative   Not on file   Social Determinants of Health   Financial Resource Strain: Low Risk  (06/12/2022)   Overall Financial  Resource Strain (CARDIA)    Difficulty of Paying Living Expenses: Not very hard  Food Insecurity: No Food Insecurity (11/25/2022)   Hunger Vital Sign    Worried About Running Out of Food in the Last Year: Never true    Ran Out of Food in the Last Year: Never true  Transportation Needs: No Transportation Needs (11/25/2022)   PRAPARE - Hydrologist (Medical): No    Lack of Transportation (Non-Medical): No  Physical Activity: Not on file  Stress: Not on file  Social Connections: Not on file  Education Assessment and Provision:  Detailed education and instructions provided on heart failure disease management including the following:  Signs and symptoms of Heart Failure When to call the physician Importance of daily weights Low sodium diet Fluid restriction Medication management Anticipated future follow-up appointments  Patient education given on each of the above topics.  Patient acknowledges understanding via teach back method and acceptance of all instructions.  Education Materials:  "Living Better With Heart Failure" Booklet, HF zone tool, & Daily Weight Tracker Tool.  Patient has scale at home: yes Patient has pill box at home: Na    High Risk Criteria for Readmission and/or Poor Patient Outcomes: Heart failure hospital admissions (last 6 months): 1  No Show rate: 0 Difficult social situation: No Demonstrates medication adherence: Yes  Primary Language: English Literacy level: reading, writing, and comprehension  Barriers of Care:   Continued HF education Diet/ fluid/ daily weights  Considerations/Referrals:   Referral made to Heart Failure Pharmacist Stewardship: yes Referral made to Heart Failure CSW/NCM TOC: No Referral made to Heart & Vascular TOC clinic: Yes, 12/11/22  Items for Follow-up on DC/TOC: Continued Hf education Diet/ fluid/ daily weights   Earnestine Leys, BSN, RN Heart Failure Transport planner Only

## 2022-11-28 ENCOUNTER — Telehealth: Payer: Self-pay

## 2022-11-28 NOTE — Patient Outreach (Addendum)
  Care Coordination TOC Note Transition Care Management Unsuccessful Follow-up Telephone Call  Date of discharge and from where:  11/27/22-[place]  Attempts:  1st Attempt  Reason for unsuccessful TCM follow-up call:  Left voice message   Hetty Blend [place] Management Telephonic Care Management Coordinator Direct Phone: [phone] Toll Free: [phone] Fax: [phone]

## 2022-11-29 ENCOUNTER — Telehealth: Payer: Self-pay

## 2022-11-29 NOTE — Patient Outreach (Signed)
  Care Coordination TOC Note Transition Care Management Unsuccessful Follow-up Telephone Call  Date of discharge and from where:  11/27/22-[place]  Attempts:  2nd Attempt  Reason for unsuccessful TCM follow-up call:  No answer/busy     Enzo Montgomery, RN,BSN,CCM [place] Management Telephonic Care Management Coordinator Direct Phone: [phone] Toll Free: [phone] Fax: [phone]

## 2022-11-29 NOTE — Patient Outreach (Signed)
  Care Coordination TOC Note Transition Care Management Unsuccessful Follow-up Telephone Call  Date of discharge and from where:  11/27/22-[place]   Attempts:  3rd Attempt  Reason for unsuccessful TCM follow-up call:  Unable to reach patient    Hetty Blend [place] Management Telephonic Care Management Coordinator Direct Phone: [phone] Toll Free: [phone] Fax: [phone]

## 2022-12-01 NOTE — Progress Notes (Unsigned)
Office Visit    Patient Name: Amy Howell Date of Encounter: 12/01/2022  PCP:  Ladell Pier, MD   [place]  Cardiologist:  Thompson Grayer, MD  Advanced Practice Provider:  No care team member to display Electrophysiologist:  None   HPI    Amy Howell is a 66 y.o. female with a past medical history significant for hypertension, stroke, vertigo, carotid artery stenosis, and cataracts presents today for follow-up appointment status post PCI  Was last seen by Dr. Rayann Heman in 2020 and at that time was doing well.  She presented to the ER with 2 weeks of orthopnea and dyspnea on exertion.  In the ER found to have SpO2 70s, BNP 1500, BP greater than 200 mmHg and CXR with congestion and cardiomegaly.  Admitted and started on Lasix.  She ultimately underwent cardiac catheterization which showed RCA stenosis and was stented.  Her CHF symptoms resolved and her dyspnea resolved.  Discharged with close cardiac follow-up.  Today, she ***  Past Medical History    Past Medical History:  Diagnosis Date   Cataracts, bilateral    Cerebral infarction due to carotid artery stenosis (HCC)    Diabetes mellitus type 2 in nonobese ([place])    Glaucoma    Hypertension    Stroke ([place])    no residual, "series of mini strokes"   Tobacco abuse    Vertigo    Past Surgical History:  Procedure Laterality Date   ABDOMINAL HYSTERECTOMY     CARDIAC DEFIBRILLATOR PLACEMENT     CORONARY STENT INTERVENTION N/A 11/26/2022   Procedure: CORONARY STENT INTERVENTION;  Surgeon: Jettie Booze, MD;  Location: [facility];  Service: Cardiovascular;  Laterality: N/A;   gum operation     Implantable loop recorder removal  02/01/2019   MDT Reveal LINQ explantation in office by JA   IR [place]  05/07/2017   IR ANGIO INTRA EXTRACRAN SEL COM CAROTID INNOMINATE BILAT MOD SED  04/23/2017   IR ANGIO INTRA EXTRACRAN SEL COM CAROTID INNOMINATE BILAT MOD SED  08/06/2017    IR ANGIO INTRA EXTRACRAN SEL COM CAROTID INNOMINATE UNI R MOD SED  05/07/2017   IR ANGIO VERTEBRAL SEL VERTEBRAL BILAT MOD SED  04/23/2017   IR ANGIO VERTEBRAL SEL VERTEBRAL BILAT MOD SED  08/06/2017   IR ANGIOGRAM FOLLOW UP STUDY  05/07/2017   IR ANGIOGRAM SELECTIVE EACH ADDITIONAL VESSEL  05/07/2017   IR RADIOLOGIST EVAL & MGMT  04/16/2017   IR RADIOLOGIST EVAL & MGMT  05/29/2017   IR TRANSCATH/EMBOLIZ  05/07/2017   LOOP RECORDER INSERTION N/A 03/28/2017   Procedure: Loop Recorder Insertion;  Surgeon: Thompson Grayer, MD;  Location: [facility];  Service: Cardiovascular;  Laterality: N/A;   MASTECTOMY W/ SENTINEL NODE BIOPSY Right 06/25/2022   Procedure: RIGHT MASTECTOMY WITH AXILLARY SENTINEL LYMPH NODE BIOPSY;  Surgeon: Donnie Mesa, MD;  Location: [facility];  Service: General;  Laterality: Right;   RADIOLOGY WITH ANESTHESIA N/A 05/07/2017   Procedure: EMBOLIZATION;  Surgeon: Luanne Bras, MD;  Location: [facility];  Service: Radiology;  Laterality: N/A;   RADIOLOGY WITH ANESTHESIA N/A 08/06/2017   Procedure: RADIOLOGY WITH ANESTHESIA STENTING;  Surgeon: Luanne Bras, MD;  Location: [facility];  Service: Radiology;  Laterality: N/A;   RIGHT/LEFT HEART CATH AND CORONARY ANGIOGRAPHY N/A 11/26/2022   Procedure: RIGHT/LEFT HEART CATH AND CORONARY ANGIOGRAPHY;  Surgeon: Jettie Booze, MD;  Location: [facility];  Service: Cardiovascular;  Laterality: N/A;   TEE WITHOUT CARDIOVERSION N/A 03/28/2017   Procedure: TRANSESOPHAGEAL ECHOCARDIOGRAM (TEE);  Surgeon: Lelon Perla, MD;  [facility];  Service: Cardiovascular;  Laterality: N/A;    Allergies  Allergies  Allergen Reactions   Milk-Related Compounds Diarrhea   Beef-Derived Products    Chicken Protein    Fish-Derived Products    Lisinopril Other (See Comments)    Angioedema   Pork-Derived Products     EKGs/Labs/Other Studies Reviewed:   The following studies were reviewed today Cardiac cath  11/26/22  Left Anterior Descending  The vessel exhibits minimal luminal irregularities.    Left Circumflex  Vessel is large. The vessel exhibits minimal luminal irregularities.    First Obtuse Marginal Branch  1st Mrg lesion is 50% stenosed. The lesion is eccentric.    Right Coronary Artery  There is mild diffuse disease throughout the vessel.  Mid RCA lesion is 75% stenosed. The lesion is eccentric.    Intervention   Mid RCA lesion  Stent  CATH LAUNCHER 6FR JR4 guide catheter was inserted. Lesion crossed with guidewire using a WIRE ASAHI PROWATER 300CM. Pre-stent angioplasty was performed using a BALLN EMERGE MR [id number]. A drug-eluting stent was successfully placed using a SYNERGY XD 3.0X20. Stent strut is well apposed. Post-stent angioplasty was performed using a BALL SAPPHIRE NC24 3.5X12.  Post-Intervention Lesion Assessment  The intervention was successful. Pre-interventional TIMI flow is 3. Post-intervention TIMI flow is 3. No complications occurred at this lesion.  There is a 0% residual stenosis post intervention.     Right Heart  Right Heart Pressures Aortic saturation 96%, PA saturation 70%, PA pressure 29/12, mean PA pressure 20 mmHg, mean pulmonary capillary wedge pressure 5 mmHg, mean right atrial pressure 1 mmHg, cardiac output 5.02 L/min, cardiac index 3.58.   Left Heart  Left Ventricle The left ventricular size is in the upper limits of normal. There is moderate left ventricular systolic dysfunction. LV end diastolic pressure is moderately elevated. The left ventricular ejection fraction is 35-45% by visual estimate. There are LV function abnormalities due to global hypokinesis.  Aortic Valve There is no aortic valve stenosis.   Coronary Diagrams  Diagnostic Dominance: Right  Intervention      EKG:  EKG is *** ordered today.  The ekg ordered today demonstrates ***  Recent Labs: 11/21/2022: ALT 25 11/22/2022: TSH 0.506 11/25/2022: B Natriuretic Peptide  278.6 11/26/2022: BUN 21; Creatinine, Ser 0.97; Hemoglobin 11.2; Hemoglobin 11.6; Magnesium 2.2; Platelets 136; Potassium 3.9; Potassium 3.9; Sodium 140; Sodium 139  Recent Lipid Panel    Component Value Date/Time   CHOL 194 11/22/2022 0609   CHOL 209 (H) 06/28/2021 1525   TRIG 80 11/22/2022 0609   HDL 43 11/22/2022 0609   HDL 47 06/28/2021 1525   CHOLHDL 4.5 11/22/2022 0609   VLDL 16 11/22/2022 0609   LDLCALC 135 (H) 11/22/2022 0609   LDLCALC 128 (H) 06/28/2021 1525    Risk Assessment/Calculations:  {Does this patient have ATRIAL FIBRILLATION?[phone]}  Home Medications   No outpatient medications have been marked as taking for the 12/04/22 encounter (Appointment) with Elgie Collard, PA-C.     Review of Systems   ***   All other systems reviewed and are otherwise negative except as noted above.  Physical Exam    VS:  There were no vitals taken for this visit. , BMI There is no height or weight on file to calculate BMI.  Wt Readings from Last 3 Encounters:  11/27/22 98 lb 3.2  oz (44.5 kg)  07/09/22 102 lb 6.4 oz (46.4 kg)  06/25/22 101 lb 6.6 oz (46 kg)     GEN: Well nourished, well developed, in no acute distress. HEENT: normal. Neck: Supple, no JVD, carotid bruits, or masses. Cardiac: ***RRR, no murmurs, rubs, or gallops. No clubbing, cyanosis, edema.  ***Radials/PT 2+ and equal bilaterally.  Respiratory:  ***Respirations regular and unlabored, clear to auscultation bilaterally. GI: Soft, nontender, nondistended. MS: No deformity or atrophy. Skin: Warm and dry, no rash. Neuro:  Strength and sensation are intact. Psych: Normal affect.  Assessment & Plan    CAD sp stenting CHF AKI  No BP recorded.  {Refresh Note OR Click here to enter BP  :1}***    {The patient has an active order for outpatient cardiac rehabilitation.   Please indicate if the patient is ready to start. Do NOT delete this.  It will auto delete.  Refresh note, then sign.               Click here to document readiness and see contraindications.  :1}  Cardiac Rehabilitation Eligibility Assessment     Disposition: Follow up {follow up:15908} with Thompson Grayer, MD or APP.  Signed, Elgie Collard, PA-C 12/01/2022, 6:36 PM [facility]

## 2022-12-04 ENCOUNTER — Other Ambulatory Visit (HOSPITAL_COMMUNITY): Payer: Self-pay

## 2022-12-04 ENCOUNTER — Encounter: Payer: Self-pay | Admitting: Physician Assistant

## 2022-12-04 ENCOUNTER — Ambulatory Visit: Payer: Medicare (Managed Care) | Attending: Physician Assistant | Admitting: Physician Assistant

## 2022-12-04 VITALS — BP 150/70 | HR 85 | Ht 60.0 in | Wt 100.2 lb

## 2022-12-04 DIAGNOSIS — I255 Ischemic cardiomyopathy: Secondary | ICD-10-CM

## 2022-12-04 DIAGNOSIS — I2510 Atherosclerotic heart disease of native coronary artery without angina pectoris: Secondary | ICD-10-CM

## 2022-12-04 DIAGNOSIS — N179 Acute kidney failure, unspecified: Secondary | ICD-10-CM

## 2022-12-04 DIAGNOSIS — I5043 Acute on chronic combined systolic (congestive) and diastolic (congestive) heart failure: Secondary | ICD-10-CM

## 2022-12-04 MED ORDER — LOSARTAN POTASSIUM 25 MG PO TABS
25.0000 mg | ORAL_TABLET | Freq: Every day | ORAL | 3 refills | Status: DC
  Filled 2022-12-04: qty 90, 90d supply, fill #0

## 2022-12-04 NOTE — Patient Instructions (Signed)
Medication Instructions:  1.Increase losartan to 25 mg daily *If you need a refill on your cardiac medications before your next appointment, please call your pharmacy*   Lab Work: Fasting lipids and lft's in 2 months If you have labs (blood work) drawn today and your tests are completely normal, you will receive your results only by: [place] (if you have MyChart) OR A paper copy in the mail If you have any lab test that is abnormal or we need to change your treatment, we will call you to review the results.   Testing/Procedures: Your physician has requested that you have an echocardiogram in 2 months. Echocardiography is a painless test that uses sound waves to create images of your heart. It provides your doctor with information about the size and shape of your heart and how well your heart's chambers and valves are working. This procedure takes approximately one hour. There are no restrictions for this procedure. Please do NOT wear cologne, perfume, aftershave, or lotions (deodorant is allowed). Please arrive 15 minutes prior to your appointment time.    Follow-Up: At [facility], you and your health needs are our priority.  As part of our continuing mission to provide you with exceptional heart care, we have created designated Provider Care Teams.  These Care Teams include your primary Cardiologist (physician) and Advanced Practice Providers (APPs -  Physician Assistants and Nurse Practitioners) who all work together to provide you with the care you need, when you need it.   Your next appointment:   3 month(s)  The format for your next appointment:   In Person  Provider:   [name], MD    Other Instructions Check your blood pressure daily, one hour after taking your morning medications for the next 2 weeks, keep a log and send [place] the readings through mychart.  Important Information About [place]

## 2022-12-11 ENCOUNTER — Ambulatory Visit (HOSPITAL_COMMUNITY)
Admit: 2022-12-11 | Discharge: 2022-12-11 | Disposition: A | Discharge status: Home / Self Care | Payer: Medicare (Managed Care) | Attending: Adult Health | Admitting: Adult Health

## 2022-12-11 ENCOUNTER — Other Ambulatory Visit (HOSPITAL_COMMUNITY): Payer: Self-pay

## 2022-12-11 VITALS — BP 160/100 | HR 66 | Wt 101.0 lb

## 2022-12-11 DIAGNOSIS — Z79899 Other long term (current) drug therapy: Secondary | ICD-10-CM | POA: Insufficient documentation

## 2022-12-11 DIAGNOSIS — I110 Hypertensive heart disease with heart failure: Secondary | ICD-10-CM | POA: Insufficient documentation

## 2022-12-11 DIAGNOSIS — I2510 Atherosclerotic heart disease of native coronary artery without angina pectoris: Secondary | ICD-10-CM

## 2022-12-11 DIAGNOSIS — I5022 Chronic systolic (congestive) heart failure: Secondary | ICD-10-CM | POA: Diagnosis not present

## 2022-12-11 DIAGNOSIS — Z9011 Acquired absence of right breast and nipple: Secondary | ICD-10-CM | POA: Diagnosis not present

## 2022-12-11 DIAGNOSIS — Z7982 Long term (current) use of aspirin: Secondary | ICD-10-CM | POA: Insufficient documentation

## 2022-12-11 DIAGNOSIS — Z7902 Long term (current) use of antithrombotics/antiplatelets: Secondary | ICD-10-CM | POA: Insufficient documentation

## 2022-12-11 DIAGNOSIS — Z955 Presence of coronary angioplasty implant and graft: Secondary | ICD-10-CM | POA: Insufficient documentation

## 2022-12-11 DIAGNOSIS — I351 Nonrheumatic aortic (valve) insufficiency: Secondary | ICD-10-CM | POA: Diagnosis not present

## 2022-12-11 DIAGNOSIS — I255 Ischemic cardiomyopathy: Secondary | ICD-10-CM

## 2022-12-11 DIAGNOSIS — N179 Acute kidney failure, unspecified: Secondary | ICD-10-CM | POA: Diagnosis not present

## 2022-12-11 DIAGNOSIS — I10 Essential (primary) hypertension: Secondary | ICD-10-CM | POA: Diagnosis not present

## 2022-12-11 DIAGNOSIS — Z888 Allergy status to other drugs, medicaments and biological substances status: Secondary | ICD-10-CM | POA: Insufficient documentation

## 2022-12-11 DIAGNOSIS — Z853 Personal history of malignant neoplasm of breast: Secondary | ICD-10-CM | POA: Diagnosis not present

## 2022-12-11 DIAGNOSIS — F17210 Nicotine dependence, cigarettes, uncomplicated: Secondary | ICD-10-CM | POA: Insufficient documentation

## 2022-12-11 LAB — BASIC METABOLIC PANEL
Anion gap: 11 (ref 5–15)
BUN: 21 mg/dL (ref 8–23)
CO2: 21 mmol/L — ABNORMAL LOW (ref 22–32)
Calcium: 9.4 mg/dL (ref 8.9–10.3)
Chloride: 105 mmol/L (ref 98–111)
Creatinine, Ser: 1.04 mg/dL — ABNORMAL HIGH (ref 0.44–1.00)
GFR, Estimated: 58 mL/min — ABNORMAL LOW (ref >60)
Glucose, Bld: 77 mg/dL (ref 70–99)
Potassium: 4.2 mmol/L (ref 3.5–5.1)
Sodium: 137 mmol/L (ref 135–145)

## 2022-12-11 MED ORDER — ROSUVASTATIN CALCIUM 40 MG PO TABS
40.0000 mg | ORAL_TABLET | Freq: Every day | ORAL | 3 refills | Status: DC
  Filled 2022-12-11 – 2022-12-23 (×2): qty 30, 30d supply, fill #0
  Filled 2023-01-21: qty 30, 30d supply, fill #1
  Filled 2023-03-16: qty 30, 30d supply, fill #2

## 2022-12-11 NOTE — Patient Instructions (Addendum)
Labs done today. We will contact you only if your labs are abnormal.  Start taking Crestor at bedtime  STOP taking Losartan  No other medication changes were made. Please continue all current medications as prescribed.  Your physician recommends that you schedule a follow-up appointment in: 3 weeks

## 2022-12-11 NOTE — Progress Notes (Signed)
HEART & VASCULAR TRANSITION OF CARE CONSULT NOTE     Referring Physician: Dr Loleta Books  Primary Care: Dr Wynetta Emery  Primary Cardiologist:Dr Scarlette Calico  Oncology: Dr Chryl Heck   HPI: Referred to clinic by Dr Loleta Books  for heart failure consultation.   Amy Howell is a 67 year old with a history of  DMII, HTN, brain aneurysm with stent placed, CVA, R breast cancer s/p mastectomy(no radiation or chemo), CAD PCI RCA, and HFrEF.   She stopped taking ananstrozole due to extreme fatigue.   Admitted 11/21/22 with increased shortness of breath in the setting of Acute HFrEF. Had cath - mid RCA 75%. Had PCI to RCA. Echo showed EF 30-35% . Hospital course complicated by AKI. GDMT started--> losartan, coreg, and spironolactone.   She saw [facility] 12/04/21/ Stable at that time Losartan was increase to 25 mg. She later stopped due to headache.   Overall feeling fine. Denies SOB/PND/Orthopnea. No chest pain. Appetite ok. No fever or chills. She has not been weighing at home.  Taking all medications except losartan and crestor. She stopped losartan and crestor due to severe headache. After stopping the medications on Monday she felt better. Her daughter looked up the side effects and they decided to stop losartan and crestor. Lives with her daughter. Her daughter drives her to appointments.   She is allergic to lisinopril and does not eat pork. I personally discussed all allergies and updated her allergy list.   Cardiac Testing  11/2022 PCI RCA. EF 35-45%. DAPT Aspirin + Plavix.   Echo 11/2022   1. Global hypokinesis; worse in the Inferior/inferolateral wall from base  to apex; GLS worse in the lateral wall; c/f possible left circumflex  disease. Left ventricular ejection fraction, by estimation, is 30 to 35%.  The left ventricle has moderately  decreased function. There is moderate asymmetric left ventricular  hypertrophy of the basal-septal segment. Left ventricular diastolic  parameters are  consistent with Grade I diastolic dysfunction (impaired  relaxation).   2. Right ventricular systolic function is mildly reduced. The right  ventricular size is normal. Tricuspid regurgitation signal is inadequate  for assessing PA pressure.   3. Left atrial size was moderately dilated.   4. Trivial mitral valve regurgitation.   5. The aortic valve was not well visualized. Aortic valve regurgitation  is mild to moderate. Aortic valve sclerosis is present, with no evidence  of aortic valve stenosis.   6. The inferior vena cava is normal in size with greater than 50%  respiratory variability, suggesting right atrial pressure of 3 mmHg.   Review of Systems: [y] = yes, '[ ]'$  = no   General: Weight gain '[ ]'$ ; Weight loss '[ ]'$ ; Anorexia '[ ]'$ ; Fatigue '[ ]'$ ; Fever '[ ]'$ ; Chills '[ ]'$ ; Weakness '[ ]'$   Cardiac: Chest pain/pressure '[ ]'$ ; Resting SOB '[ ]'$ ; Exertional SOB '[ ]'$ ; Orthopnea '[ ]'$ ; Pedal Edema '[ ]'$ ; Palpitations '[ ]'$ ; Syncope '[ ]'$ ; Presyncope '[ ]'$ ; Paroxysmal nocturnal dyspnea'[ ]'$   Pulmonary: Cough '[ ]'$ ; Wheezing'[ ]'$ ; Hemoptysis'[ ]'$ ; Sputum '[ ]'$ ; Snoring '[ ]'$   GI: Vomiting'[ ]'$ ; Dysphagia'[ ]'$ ; Melena'[ ]'$ ; Hematochezia '[ ]'$ ; Heartburn'[ ]'$ ; Abdominal pain '[ ]'$ ; Constipation '[ ]'$ ; Diarrhea '[ ]'$ ; BRBPR '[ ]'$   GU: Hematuria'[ ]'$ ; Dysuria '[ ]'$ ; Nocturia'[ ]'$   Vascular: Pain in legs with walking '[ ]'$ ; Pain in feet with lying flat '[ ]'$ ; Non-healing sores '[ ]'$ ; Stroke [ Y]; TIA '[ ]'$ ; Slurred speech '[ ]'$ ;  Neuro: Headaches[Y ]; Vertigo'[ ]'$ ;  Seizures'[ ]'$ ; Paresthesias'[ ]'$ ;Blurred vision '[ ]'$ ; Diplopia '[ ]'$ ; Vision changes '[ ]'$   Ortho/Skin: Arthritis '[ ]'$ ; Joint pain [ Y]; Muscle pain '[ ]'$ ; Joint swelling '[ ]'$ ; Back Pain '[ ]'$ ; Rash '[ ]'$   Psych: Depression'[ ]'$ ; Anxiety'[ ]'$   Heme: Bleeding problems '[ ]'$ ; Clotting disorders '[ ]'$ ; Anemia '[ ]'$   Endocrine: Diabetes [ Y]; Thyroid dysfunction'[ ]'$    Past Medical History:  Diagnosis Date   Cataracts, bilateral    Cerebral infarction due to carotid artery stenosis (HCC)    Diabetes mellitus type 2 in nonobese (HCC)     Glaucoma    Hypertension    Stroke ([place])    no residual, "series of mini strokes"   Tobacco abuse    Vertigo     Current Outpatient Medications  Medication Sig Dispense Refill   aspirin 81 MG chewable tablet Chew 1 tablet (81 mg total) by mouth daily. 30 tablet 3   carvedilol (COREG) 6.25 MG tablet Take 1 tablet (6.25 mg total) by mouth 2 (two) times daily with a meal. 60 tablet 3   clopidogrel (PLAVIX) 75 MG tablet Take 1 tablet (75 mg total) by mouth daily with breakfast. 30 tablet 3   dorzolamide (TRUSOPT) 2 % ophthalmic solution Instill 1 drop into both eyes twice a day 30 mL 3   feeding supplement, ENSURE ENLIVE, (ENSURE ENLIVE) LIQD Take 237 mLs by mouth daily.     latanoprost (XALATAN) 0.005 % ophthalmic solution Instill 1 drop into both eyes at bedtime 22.5 mL 3   spironolactone (ALDACTONE) 25 MG tablet Take 1/2 tablet (12.5 mg total) by mouth daily. 15 tablet 3   timolol (TIMOPTIC) 0.5 % ophthalmic solution Place 1 drop into both eyes in the morning. 45 mL 3   triamcinolone cream (KENALOG) 0.1 % Apply 1 application topically 2 (two) times daily. (Patient taking differently: Apply 1 application  topically daily as needed (for rash/itching).) 30 g 0   losartan (COZAAR) 25 MG tablet Take 1 tablet (25 mg total) by mouth daily. (Patient not taking: Reported on 12/11/2022) 90 tablet 3   rosuvastatin (CRESTOR) 40 MG tablet Take 1 tablet (40 mg total) by mouth daily. (Patient not taking: Reported on 12/11/2022) 30 tablet 3   No current facility-administered medications for this encounter.    Allergies  Allergen Reactions   Lisinopril Other (See Comments)    Angioedema   Pork-Derived Products       Social History   Socioeconomic History   Marital status: Single    Spouse name: Not on file   Number of children: Not on file   Years of education: Not on file   Highest education level: Not on file  Occupational History   Occupation: retired  Tobacco Use   Smoking status: Every  Day    Packs/day: 0.50    Years: 56.00    Total pack years: 28.00    Types: Cigarettes   Smokeless tobacco: Never  Vaping Use   Vaping Use: Never used  Substance and Sexual Activity   Alcohol use: No   Drug use: No   Sexual activity: Not Currently    Birth control/protection: Surgical    Comment: Hyst  Other Topics Concern   Not on file  Social History Narrative   Not on file   Social Determinants of Health   Financial Resource Strain: Low Risk  (06/12/2022)   Overall Financial Resource Strain (CARDIA)    Difficulty of Paying Living Expenses: Not very hard  Food Insecurity:  No Food Insecurity (11/25/2022)   Hunger Vital Sign    Worried About Running Out of Food in the Last Year: Never true    Ran Out of Food in the Last Year: Never true  Transportation Needs: No Transportation Needs (11/25/2022)   PRAPARE - Hydrologist (Medical): No    Lack of Transportation (Non-Medical): No  Physical Activity: Not on file  Stress: Not on file  Social Connections: Not on file  Intimate Partner Violence: Not At Risk (11/25/2022)   Humiliation, Afraid, Rape, and Kick questionnaire    Fear of Current or Ex-Partner: No    Emotionally Abused: No    Physically Abused: No    Sexually Abused: No      Family History  Problem Relation Age of Onset   Dementia Mother    Kidney disease Father    Hypertension Father    Diabetes Father    Heart disease Father    Lung cancer Maternal Aunt        dx after 11   Prostate cancer Other        MGM's brother   Breast cancer Neg Hx     Vitals:   12/11/22 1510  BP: (!) 160/100  Pulse: 66  SpO2: 98%  Weight: 45.8 kg (101 lb)   Wt Readings from Last 3 Encounters:  12/11/22 45.8 kg (101 lb)  12/04/22 45.5 kg (100 lb 3.2 oz)  11/27/22 44.5 kg (98 lb 3.2 oz)    PHYSICAL EXAM: General:  Well appearing. No respiratory difficulty. Walked in clinic.  HEENT: normal Neck: supple. no JVD. Carotids 2+ bilat; no bruits.  No lymphadenopathy or thryomegaly appreciated. Cor: PMI nondisplaced. Regular rate & rhythm. No rubs, gallops or murmurs. Lungs: clear Abdomen: soft, nontender, nondistended. No hepatosplenomegaly. No bruits or masses. Good bowel sounds. Extremities: no cyanosis, clubbing, rash, edema Neuro: alert & oriented x 3, cranial nerves grossly intact. moves all 4 extremities w/o difficulty. Affect pleasant.  ECG:SR 66 bpm personally checked.    ASSESSMENT & PLAN: 1. Chronic HFrEF Echo EF 30-35%. Suspect mixed etiology. HTN/CAD .  NYHA I. Functionally doing well. No limitations.  GDMT  Diuretic-Volume status stable. Does not need loop diuretics.  BB-Continue coreg 6/25 mg twice a day  Ace/ARB/ARNI- Lisinopril allergy with angioedema. No a candidate for entresto. She stopped losartan due to extreme headache. She refuses to re challenge. I am discontinuing on her medication list.  MRA- Continue current dose of spironolactone  SGLT2i- Consider down the road. Possibly next visit.  Will need to carefully add meds because she is reluctant to add medications.  Repeat ECHO in 3 months.   2. CAD S/P PCI RCA. No chest pain  LDL 134 HDL 43  She stopped crestor last week due to extreme headache. I have asked her to start taking crestor at bedtime and try this for 2-3 nights. If unable to tolerate will need to refer to the Lipid Clinic.  On aspirin + plavix. No bleeding issues.   3. HTN  As above. Uncontrolled. She is reluctant to add medications today. Will need to add one thing at a time.  - Consider amlodipine next visit given it is once a day. I doubt she would take Bidil three times a day.   4. AKI During recent hospitalization. Creatinine bumped to 1.8 Will need BMET today.   5. Breast Cancer, R S/P mastectomy  She did not receive radiation of chemo.    Referred to HFSW (  PCP, Medications, Transportation, ETOH Abuse, Drug Abuse, Insurance, Financial ): No Refer to Pharmacy: Yes Refer to  Home Health:  No Refer to Advanced Heart Failure Clinic:  no  Refer to General Cardiology: She has follow up with Dr Scarlette Calico  Follow up 3 weeks with APP and Pharmacy.   Amy Geer NP-C  5:18 PM

## 2022-12-12 ENCOUNTER — Telehealth (HOSPITAL_COMMUNITY): Payer: Self-pay

## 2022-12-12 ENCOUNTER — Telehealth: Payer: Self-pay | Admitting: Internal Medicine

## 2022-12-12 NOTE — Telephone Encounter (Signed)
Attempted to call patient in regards to Cardiac Rehab - LM on VM 

## 2022-12-12 NOTE — Telephone Encounter (Signed)
Left message for patient to call back and schedule Medicare Annual Wellness Visit (AWV) either virtually or phone  . Left  my Amy Howell number [phone]   awvi 12/03/19 per [place]

## 2022-12-20 ENCOUNTER — Other Ambulatory Visit (HOSPITAL_COMMUNITY): Payer: Self-pay

## 2022-12-20 ENCOUNTER — Other Ambulatory Visit (HOSPITAL_BASED_OUTPATIENT_CLINIC_OR_DEPARTMENT_OTHER): Payer: Self-pay

## 2022-12-23 ENCOUNTER — Other Ambulatory Visit (HOSPITAL_COMMUNITY): Payer: Self-pay

## 2023-01-02 ENCOUNTER — Ambulatory Visit (HOSPITAL_COMMUNITY): Payer: Medicare (Managed Care)

## 2023-01-03 ENCOUNTER — Ambulatory Visit (HOSPITAL_COMMUNITY): Payer: Medicare (Managed Care)

## 2023-01-07 ENCOUNTER — Telehealth (HOSPITAL_COMMUNITY): Payer: Self-pay

## 2023-01-07 NOTE — Telephone Encounter (Signed)
Left message to confirm appointment for 01/08/23

## 2023-01-08 ENCOUNTER — Other Ambulatory Visit (HOSPITAL_COMMUNITY): Payer: Self-pay

## 2023-01-08 ENCOUNTER — Ambulatory Visit (HOSPITAL_COMMUNITY)
Admission: RE | Admit: 2023-01-08 | Discharge: 2023-01-08 | Disposition: A | Discharge status: Home / Self Care | Payer: Medicare (Managed Care) | Source: Ambulatory Visit | Attending: Cardiology | Admitting: Cardiology

## 2023-01-08 ENCOUNTER — Encounter (HOSPITAL_COMMUNITY): Payer: Self-pay

## 2023-01-08 VITALS — HR 78 | Ht 60.0 in | Wt 100.0 lb

## 2023-01-08 DIAGNOSIS — I10 Essential (primary) hypertension: Secondary | ICD-10-CM | POA: Diagnosis not present

## 2023-01-08 DIAGNOSIS — Z7984 Long term (current) use of oral hypoglycemic drugs: Secondary | ICD-10-CM | POA: Diagnosis not present

## 2023-01-08 DIAGNOSIS — E118 Type 2 diabetes mellitus with unspecified complications: Secondary | ICD-10-CM | POA: Insufficient documentation

## 2023-01-08 DIAGNOSIS — Z79899 Other long term (current) drug therapy: Secondary | ICD-10-CM | POA: Insufficient documentation

## 2023-01-08 DIAGNOSIS — I110 Hypertensive heart disease with heart failure: Secondary | ICD-10-CM | POA: Insufficient documentation

## 2023-01-08 DIAGNOSIS — I5022 Chronic systolic (congestive) heart failure: Secondary | ICD-10-CM

## 2023-01-08 DIAGNOSIS — Z955 Presence of coronary angioplasty implant and graft: Secondary | ICD-10-CM | POA: Diagnosis not present

## 2023-01-08 DIAGNOSIS — I252 Old myocardial infarction: Secondary | ICD-10-CM | POA: Diagnosis not present

## 2023-01-08 DIAGNOSIS — I2510 Atherosclerotic heart disease of native coronary artery without angina pectoris: Secondary | ICD-10-CM | POA: Insufficient documentation

## 2023-01-08 DIAGNOSIS — Z8673 Personal history of transient ischemic attack (TIA), and cerebral infarction without residual deficits: Secondary | ICD-10-CM | POA: Diagnosis not present

## 2023-01-08 LAB — BASIC METABOLIC PANEL
Anion gap: 11 (ref 5–15)
BUN: 24 mg/dL — ABNORMAL HIGH (ref 8–23)
CO2: 21 mmol/L — ABNORMAL LOW (ref 22–32)
Calcium: 9.4 mg/dL (ref 8.9–10.3)
Chloride: 103 mmol/L (ref 98–111)
Creatinine, Ser: 1.32 mg/dL — ABNORMAL HIGH (ref 0.44–1.00)
GFR, Estimated: 44 mL/min — ABNORMAL LOW (ref >60)
Glucose, Bld: 72 mg/dL (ref 70–99)
Potassium: 4.6 mmol/L (ref 3.5–5.1)
Sodium: 135 mmol/L (ref 135–145)

## 2023-01-08 LAB — BRAIN NATRIURETIC PEPTIDE: B Natriuretic Peptide: 748.9 pg/mL — ABNORMAL HIGH (ref 0.0–100.0)

## 2023-01-08 MED ORDER — SPIRONOLACTONE 25 MG PO TABS
25.0000 mg | ORAL_TABLET | Freq: Every day | ORAL | 2 refills | Status: DC
  Filled 2023-01-08 – 2023-01-14 (×2): qty 30, 30d supply, fill #0
  Filled 2023-02-13: qty 30, 30d supply, fill #1
  Filled 2023-03-16: qty 30, 30d supply, fill #2

## 2023-01-08 MED ORDER — CARVEDILOL 6.25 MG PO TABS
6.2500 mg | ORAL_TABLET | Freq: Two times a day (BID) | ORAL | 2 refills | Status: DC
  Filled 2023-01-08 – 2023-01-21 (×2): qty 60, 30d supply, fill #0

## 2023-01-08 NOTE — Progress Notes (Signed)
HEART & VASCULAR TRANSITION OF CARE PROGRESS NOTE     Referring Physician: Dr Loleta Books  Primary Care: Dr Wynetta Emery  Primary Cardiologist:Dr Scarlette Calico  Oncology: Dr Chryl Heck   HPI: Referred to clinic by Dr Loleta Books  for heart failure consultation.   Amy Howell is a 67 year old with a history of  DMII, HTN, brain aneurysm with stent placed, CVA, R breast cancer s/p mastectomy(no radiation or chemo), CAD PCI RCA, and HFrEF. H/o angioedema w/ lisinopril.   She stopped taking ananstrozole due to extreme fatigue.   Admitted 11/21/22 with increased shortness of breath in the setting of Acute HFrEF. Had cath - mid RCA 75%. Had PCI to RCA. Echo showed EF 30-35% . Hospital course complicated by AKI. GDMT started--> losartan, coreg, and spironolactone.   She [facility] 12/04/21/ Stable at that time Losartan was increase to 25 mg. She later stopped due to headache.   She presents back today for f/u. Here w/ daughter. BP remains elevated 170/100. Reports full compliance w/ meds but turns out she has only been taking Coreg once daily as opposed to bid. Didn't realize she was to take twice daily. HAs have resolved. Denies CP. Denies dyspnea. Able to do ADLs w/o much limitation. No LEE or orthopnea. She notes history of high sodium intake, though has scaled back since recent diagnosis. Still smoking cigarettes, 2-3 day. No h/o snoring.     Cardiac Testing  11/2022 PCI RCA. EF 35-45%. DAPT Aspirin + Plavix.   Echo 11/2022   1. Global hypokinesis; worse in the Inferior/inferolateral wall from base  to apex; GLS worse in the lateral wall; c/f possible left circumflex  disease. Left ventricular ejection fraction, by estimation, is 30 to 35%.  The left ventricle has moderately  decreased function. There is moderate asymmetric left ventricular  hypertrophy of the basal-septal segment. Left ventricular diastolic  parameters are consistent with Grade I diastolic dysfunction (impaired  relaxation).    2. Right ventricular systolic function is mildly reduced. The right  ventricular size is normal. Tricuspid regurgitation signal is inadequate  for assessing PA pressure.   3. Left atrial size was moderately dilated.   4. Trivial mitral valve regurgitation.   5. The aortic valve was not well visualized. Aortic valve regurgitation  is mild to moderate. Aortic valve sclerosis is present, with no evidence  of aortic valve stenosis.   6. The inferior vena cava is normal in size with greater than 50%  respiratory variability, suggesting right atrial pressure of 3 mmHg.   Review of Systems: [y] = yes, '[ ]'$  = no   General: Weight gain '[ ]'$ ; Weight loss '[ ]'$ ; Anorexia '[ ]'$ ; Fatigue '[ ]'$ ; Fever '[ ]'$ ; Chills '[ ]'$ ; Weakness '[ ]'$   Cardiac: Chest pain/pressure '[ ]'$ ; Resting SOB '[ ]'$ ; Exertional SOB '[ ]'$ ; Orthopnea '[ ]'$ ; Pedal Edema '[ ]'$ ; Palpitations '[ ]'$ ; Syncope '[ ]'$ ; Presyncope '[ ]'$ ; Paroxysmal nocturnal dyspnea'[ ]'$   Pulmonary: Cough '[ ]'$ ; Wheezing'[ ]'$ ; Hemoptysis'[ ]'$ ; Sputum '[ ]'$ ; Snoring '[ ]'$   GI: Vomiting'[ ]'$ ; Dysphagia'[ ]'$ ; Melena'[ ]'$ ; Hematochezia '[ ]'$ ; Heartburn'[ ]'$ ; Abdominal pain '[ ]'$ ; Constipation '[ ]'$ ; Diarrhea '[ ]'$ ; BRBPR '[ ]'$   GU: Hematuria'[ ]'$ ; Dysuria '[ ]'$ ; Nocturia'[ ]'$   Vascular: Pain in legs with walking '[ ]'$ ; Pain in feet with lying flat '[ ]'$ ; Non-healing sores '[ ]'$ ; Stroke [ Y]; TIA '[ ]'$ ; Slurred speech '[ ]'$ ;  Neuro: Headaches[Y ]; Vertigo'[ ]'$ ; Seizures'[ ]'$ ; Paresthesias'[ ]'$ ;Blurred vision '[ ]'$ ;  Diplopia '[ ]'$ ; Vision changes '[ ]'$   Ortho/Skin: Arthritis '[ ]'$ ; Joint pain [ Y]; Muscle pain '[ ]'$ ; Joint swelling '[ ]'$ ; Back Pain '[ ]'$ ; Rash '[ ]'$   Psych: Depression'[ ]'$ ; Anxiety'[ ]'$   Heme: Bleeding problems '[ ]'$ ; Clotting disorders '[ ]'$ ; Anemia '[ ]'$   Endocrine: Diabetes [ Y]; Thyroid dysfunction'[ ]'$    Past Medical History:  Diagnosis Date   Cataracts, bilateral    Cerebral infarction due to carotid artery stenosis (HCC)    Diabetes mellitus type 2 in nonobese (HCC)    Glaucoma    Hypertension    Stroke ([place])    no residual, "series of mini  strokes"   Tobacco abuse    Vertigo     Current Outpatient Medications  Medication Sig Dispense Refill   aspirin 81 MG chewable tablet Chew 1 tablet (81 mg total) by mouth daily. 30 tablet 3   carvedilol (COREG) 6.25 MG tablet Take 1 tablet (6.25 mg total) by mouth 2 (two) times daily with a meal. 60 tablet 3   clopidogrel (PLAVIX) 75 MG tablet Take 1 tablet (75 mg total) by mouth daily with breakfast. 30 tablet 3   dorzolamide (TRUSOPT) 2 % ophthalmic solution Instill 1 drop into both eyes twice a day 30 mL 3   feeding supplement, ENSURE ENLIVE, (ENSURE ENLIVE) LIQD Take 237 mLs by mouth daily.     latanoprost (XALATAN) 0.005 % ophthalmic solution Instill 1 drop into both eyes at bedtime 22.5 mL 3   rosuvastatin (CRESTOR) 40 MG tablet Take 1 tablet (40 mg total) by mouth at bedtime. 30 tablet 3   spironolactone (ALDACTONE) 25 MG tablet Take 1/2 tablet (12.5 mg total) by mouth daily. 15 tablet 3   timolol (TIMOPTIC) 0.5 % ophthalmic solution Place 1 drop into both eyes in the morning. 45 mL 3   triamcinolone cream (KENALOG) 0.1 % Apply 1 application topically 2 (two) times daily. (Patient taking differently: Apply 1 application  topically daily as needed (for rash/itching).) 30 g 0   No current facility-administered medications for this encounter.    Allergies  Allergen Reactions   Lisinopril Other (See Comments)    Angioedema   Pork-Derived Products       Social History   Socioeconomic History   Marital status: Single    Spouse name: Not on file   Number of children: Not on file   Years of education: Not on file   Highest education level: Not on file  Occupational History   Occupation: retired  Tobacco Use   Smoking status: Every Day    Packs/day: 0.50    Years: 56.00    Total pack years: 28.00    Types: Cigarettes   Smokeless tobacco: Never  Vaping Use   Vaping Use: Never used  Substance and Sexual Activity   Alcohol use: No   Drug use: No   Sexual activity: Not  Currently    Birth control/protection: Surgical    Comment: Hyst  Other Topics Concern   Not on file  Social History Narrative   Not on file   Social Determinants of Health   Financial Resource Strain: Low Risk  (06/12/2022)   Overall Financial Resource Strain (CARDIA)    Difficulty of Paying Living Expenses: Not very hard  Food Insecurity: No Food Insecurity (11/25/2022)   Hunger Vital Sign    Worried About Running Out of Food in the Last Year: Never true    Ran Out of Food in the Last Year: Never true  Transportation Needs: No Transportation Needs (11/25/2022)   PRAPARE - Hydrologist (Medical): No    Lack of Transportation (Non-Medical): No  Physical Activity: Not on file  Stress: Not on file  Social Connections: Not on file  Intimate Partner Violence: Not At Risk (11/25/2022)   Humiliation, Afraid, Rape, and Kick questionnaire    Fear of Current or Ex-Partner: No    Emotionally Abused: No    Physically Abused: No    Sexually Abused: No      Family History  Problem Relation Age of Onset   Dementia Mother    Kidney disease Father    Hypertension Father    Diabetes Father    Heart disease Father    Lung cancer Maternal Aunt        dx after 84   Prostate cancer Other        MGM's brother   Breast cancer Neg Hx     Vitals:   01/08/23 1429  Weight: 45.4 kg (100 lb)  Height: 5' (1.524 m)   Wt Readings from Last 3 Encounters:  01/08/23 45.4 kg (100 lb)  12/11/22 45.8 kg (101 lb)  12/04/22 45.5 kg (100 lb 3.2 oz)   PHYSICAL EXAM: General:  Well appearing. No respiratory difficulty HEENT: normal Neck: supple. no JVD. Carotids 2+ bilat; no bruits. No lymphadenopathy or thyromegaly appreciated. Cor: PMI nondisplaced. Regular rate & rhythm. No rubs, gallops or murmurs. Lungs: clear Abdomen: soft, nontender, nondistended. No hepatosplenomegaly. No bruits or masses. Good bowel sounds. Extremities: no cyanosis, clubbing, rash,  edema Neuro: alert & oriented x 3, cranial nerves grossly intact. moves all 4 extremities w/o difficulty. Affect pleasant.    ECG: Not performed.    ASSESSMENT & PLAN:  1. Chronic HFrEF Echo EF 30-35%. Suspect mixed etiology. HTN/CAD .  NYHA I. Functionally doing well. No limitations.  GDMT  Diuretic-Volume status stable. Does not need loop diuretics.  BB- Increase Coreg to 6.25 mg twice a day  Ace/ARB/ARNI- Lisinopril allergy with angioedema. No a candidate for entresto. She stopped losartan due to extreme headache. She refuses to re challenge.  MRA- Increase spironolactone to 25 mg daily  SGLT2i- Consider down the road. Possibly next visit.  Will need to carefully add meds because she is reluctant to add medications.  Repeat ECHO in 3 months.   2. CAD S/P PCI RCA. Stable w/o CP  LDL 134 HDL 43, recently started on Crestor  On aspirin + plavix. No bleeding issues.  Encouraged to quit smoking   3. HTN  - uncontrolled,  multiple med intolerances per above - increase coreg and spiro today per above. Check BMP  - recommend addition of Bidil next visit  - advised to limit salt and quit smoking - if unable to control despite multi-drug regimen, will need renal artery dopplers to exclude RAS   4. H/o Breast Cancer, R S/P mastectomy  She did not receive radiation of chemo.    Referred to HFSW (PCP, Medications, Transportation, ETOH Abuse, Drug Abuse, Insurance, Financial ): No Refer to Pharmacy: Yes Refer to Home Health:  No Refer to Advanced Heart Failure Clinic:  No  Refer to General Cardiology: She has follow up with Dr Scarlette Calico  F/u in 2-3 wks to reassess BP and further titrate meds. Then f/u w/ Cardiology therafter (has appt w/ Dr. Livingston Diones 4/24)    Lyda Jester PA-C  2:32 PM

## 2023-01-08 NOTE — Progress Notes (Signed)
[facility] Transitions of Care Clinic Heart Failure Pharmacist Encounter  PCP: Ladell Pier, MD PCP-Cardiologist: None  HPI:  67 yo F with PMH of CVA, T2DM, HTN, R breast cancer s/p mastectomy, CAD s/p PCI with DES to RCA, and HFrEF.   She was hospitalized from 12/21-12/27 with NSTEMI and acute on chronic CHF. ECHO 12/22 showed LVEF 30-35%, moderate LVH, G1DD, RV mildly reduced, trivial MR. Coronary CTA cancelled due to scheduling conflicts. R/LHC on 12/26 showed 75% stenosis in mid RCA, RA 1, PA 20, wedge 5, CO 5, CI 3.5. S/p PCI with DES to RCA. She was discharged on losartan, coreg, and spironolactone.   She saw [facility] cardiology on 1/3 and was stable at that time. Losartan increased to 25 mg daily. She later stopped that and crestor due to headaches and had resolution of symptoms.   She saw HF TOC on 1/10 and volume status was stable. She refused to rechallenge ARB. BP was 160/110.   Today, she presents to the [facility] for follow up. BP is 170/100. She denies shortness of breath, orthopnea/PND, edema, lightheadedness or dizziness. She reports continued weight loss. She is following a low sodium and restricted diet. She is taking all medications as prescribed, with the exception of carvedilol just once daily. She has restarted crestor.    HF Medications: Beta Blocker: carvedilol 6.25 mg BID- patient has only been taking once daily MRA: spironolactone 12.5 mg daily  Has the patient been experiencing any side effects to the medications prescribed?  no  Does the patient have any problems obtaining medications due to transportation or finances?   no  Understanding of regimen: good Understanding of indications: good Potential of compliance: good Patient understands to avoid NSAIDs. Patient understands to avoid decongestants.   Pertinent Lab Values: Serum creatinine 1.04, BUN 21, Potassium 4.2, Sodium 137  Vital Signs: Weight: 100 lbs (last  clinic weight: 101 lbs) Blood pressure: 170/100  Heart rate: 78 bpm  Medication Assistance / Insurance Benefits Check: Does the patient have prescription insurance?  Yes Type of insurance plan: [employer]  Outpatient Pharmacy:  Current outpatient pharmacy: [facility] Was the [facility] pharmacy used to supply discharge medications? yes  If TOC pharmacy was used, were the refills transferred out to current pharmacy yet? yes   Assessment: 1) Chronic systolic CHF (EF 16-10%), due to ICM. NYHA class I-II symptoms. - Does not appear volume overloaded on exam, BNP today. No indications for diuretic at this time.  - Increase carvedilol to 6.25 mg BID - No ACE/ARNI with history of angioedema - Cannot tolerate ARB at higher dose with headaches. She states she was able to tolerate 12.5 mg daily but when it was increased to 25 mg daily, this is when she got headaches. May be able to trial low dose again in the future. - Increase spironolactone to 25 mg daily. Patient instructed that she can take 2 split tabs of current supply until she runs out. - Can consider SGLT2i in the future, she is hesitant to start any new medications. May need to repeat ECHO before starting anything new to see if there are improvements in LVEF.  - She states she was taking amlodipine and tolerating it well while she was in the hospital. Could consider adding this if BP still uncontrolled. Patient and daughter educated that this can also cause edema.   Plan: 1) Medication changes: - Increase carvedilol to 6.25 mg BID - Increase spironolactone to  25 mg daily  2) Patient Assistance: - Jardiance/Farxiga copay $47 - Patient would like to transition to new PCP, CSW met with patient to provide resources  3) Follow up: - Next appointment with ECHO on 3/4 and HF TOC on 3/5 - Next appointment with [facility] on 4/15  Kerby Nora, PharmD, BCPS Heart Failure Transitions of Care Clinic  Pharmacist [phone]

## 2023-01-08 NOTE — Patient Instructions (Signed)
Labs done today. We will contact you only if your labs are abnormal.  INCREASE Carvedilol to 6.'25mg'$  (1 tablet) by mouth 2 times daily.  INCREASE Spironolactone to '25mg'$  (1 tablet) by mouth daily.   No other medication changes were made. Please continue all current medications as prescribed.  Your physician recommends that you schedule a follow-up appointment in: 2-3 weeks

## 2023-01-14 ENCOUNTER — Other Ambulatory Visit (HOSPITAL_COMMUNITY): Payer: Self-pay

## 2023-01-15 ENCOUNTER — Ambulatory Visit (HOSPITAL_COMMUNITY): Payer: Medicare (Managed Care)

## 2023-01-15 ENCOUNTER — Other Ambulatory Visit (HOSPITAL_COMMUNITY): Payer: Self-pay

## 2023-01-20 ENCOUNTER — Other Ambulatory Visit (HOSPITAL_BASED_OUTPATIENT_CLINIC_OR_DEPARTMENT_OTHER): Payer: Self-pay

## 2023-01-20 ENCOUNTER — Other Ambulatory Visit (HOSPITAL_COMMUNITY): Payer: Self-pay

## 2023-01-21 ENCOUNTER — Other Ambulatory Visit (HOSPITAL_COMMUNITY): Payer: Self-pay

## 2023-01-22 ENCOUNTER — Other Ambulatory Visit (HOSPITAL_COMMUNITY): Payer: Self-pay

## 2023-01-29 ENCOUNTER — Ambulatory Visit (HOSPITAL_COMMUNITY): Payer: Medicare (Managed Care)

## 2023-02-03 ENCOUNTER — Ambulatory Visit (HOSPITAL_COMMUNITY): Payer: Medicare (Managed Care) | Attending: Cardiology

## 2023-02-03 DIAGNOSIS — I255 Ischemic cardiomyopathy: Secondary | ICD-10-CM | POA: Diagnosis not present

## 2023-02-04 ENCOUNTER — Encounter (HOSPITAL_COMMUNITY): Payer: Self-pay

## 2023-02-04 ENCOUNTER — Ambulatory Visit (HOSPITAL_COMMUNITY)
Admission: RE | Admit: 2023-02-04 | Discharge: 2023-02-04 | Disposition: A | Discharge status: Home / Self Care | Payer: Medicare (Managed Care) | Source: Ambulatory Visit | Attending: Cardiology | Admitting: Cardiology

## 2023-02-04 VITALS — BP 142/90 | HR 77 | Wt 96.6 lb

## 2023-02-04 DIAGNOSIS — Z833 Family history of diabetes mellitus: Secondary | ICD-10-CM | POA: Insufficient documentation

## 2023-02-04 DIAGNOSIS — I110 Hypertensive heart disease with heart failure: Secondary | ICD-10-CM | POA: Insufficient documentation

## 2023-02-04 DIAGNOSIS — M79604 Pain in right leg: Secondary | ICD-10-CM | POA: Diagnosis not present

## 2023-02-04 DIAGNOSIS — F17210 Nicotine dependence, cigarettes, uncomplicated: Secondary | ICD-10-CM | POA: Diagnosis not present

## 2023-02-04 DIAGNOSIS — Z955 Presence of coronary angioplasty implant and graft: Secondary | ICD-10-CM | POA: Insufficient documentation

## 2023-02-04 DIAGNOSIS — Z853 Personal history of malignant neoplasm of breast: Secondary | ICD-10-CM | POA: Insufficient documentation

## 2023-02-04 DIAGNOSIS — R630 Anorexia: Secondary | ICD-10-CM | POA: Diagnosis not present

## 2023-02-04 DIAGNOSIS — I5022 Chronic systolic (congestive) heart failure: Secondary | ICD-10-CM | POA: Insufficient documentation

## 2023-02-04 DIAGNOSIS — R634 Abnormal weight loss: Secondary | ICD-10-CM | POA: Diagnosis not present

## 2023-02-04 DIAGNOSIS — I2510 Atherosclerotic heart disease of native coronary artery without angina pectoris: Secondary | ICD-10-CM | POA: Diagnosis not present

## 2023-02-04 DIAGNOSIS — Z79899 Other long term (current) drug therapy: Secondary | ICD-10-CM | POA: Diagnosis not present

## 2023-02-04 DIAGNOSIS — Z7982 Long term (current) use of aspirin: Secondary | ICD-10-CM | POA: Diagnosis not present

## 2023-02-04 DIAGNOSIS — Z7902 Long term (current) use of antithrombotics/antiplatelets: Secondary | ICD-10-CM | POA: Diagnosis not present

## 2023-02-04 DIAGNOSIS — Z8249 Family history of ischemic heart disease and other diseases of the circulatory system: Secondary | ICD-10-CM | POA: Insufficient documentation

## 2023-02-04 DIAGNOSIS — E119 Type 2 diabetes mellitus without complications: Secondary | ICD-10-CM | POA: Diagnosis not present

## 2023-02-04 DIAGNOSIS — Z9011 Acquired absence of right breast and nipple: Secondary | ICD-10-CM | POA: Diagnosis not present

## 2023-02-04 LAB — ECHOCARDIOGRAM COMPLETE
Area-P 1/2: 3.61 cm2
MV M vel: 6.21 m/s
MV Peak grad: 154.3 mmHg
P 1/2 time: 399 msec
S' Lateral: 3 cm

## 2023-02-04 NOTE — Patient Instructions (Signed)
No Labs done today.   No medication changes were made. Please continue all current medications as prescribed.  Thank you for allowing [place] to provide your heart failure care after your recent hospitalization. Please follow-up with General Cardiology.

## 2023-02-04 NOTE — Progress Notes (Signed)
HEART & VASCULAR TRANSITION OF CARE PROGRESS NOTE     Referring Physician: Dr Loleta Books  Primary Care: Dr Wynetta Emery  Primary Cardiologist:Dr Scarlette Calico  Oncology: Dr Chryl Heck   HPI: Referred to clinic by Dr Loleta Books  for heart failure consultation.   Amy Howell is a 67 year old with a history of  DMII, HTN, brain aneurysm with stent placed, CVA, R breast cancer s/p mastectomy(no radiation or chemo), CAD PCI RCA, and HFrEF. H/o angioedema w/ lisinopril.   She stopped taking ananstrozole due to extreme fatigue.   Admitted 11/21/22 with increased shortness of breath in the setting of Acute HFrEF. Had cath - mid RCA 75%. Had PCI to RCA. Echo showed EF 30-35% . Hospital course complicated by AKI. GDMT started--> losartan, coreg, and spironolactone.   She saw [facility] 12/04/21/.Stable at that time Losartan was increase to 25 mg. She later stopped due to headache.   At last TOC visit 01/08/23, her BP was elevated 170/100. Turns out she had only been taking Coreg once daily as opposed to bid. Didn't realize she was to take twice daily. Taking all other meds as instructed. Volume status was ok on exam but BNP was elevated 748. Spironolactone was increased to 25 mg daily and she was instructed to increase Coreg to bid.   She returns back today for f/u. Here w/ her daughter. Not taking Crestor. Only taking Coreg once daily.  BP 142/90. Reports feeling tired. Poor sleep and poor appetite. Continued wt loss, down to 96 lb. C/w med intolerances. Attributes poor appetite to meds. Continues to smoke 1/2 ppd. Discussed lung cancer screening but she is not interested. Denies CP. Reports mild RLE pain w/ ambulation. Denies melena/ hematochezia w/ DAPT.      Cardiac Testing  11/2022 PCI RCA. EF 35-45%. DAPT Aspirin + Plavix.   Echo 11/2022   1. Global hypokinesis; worse in the Inferior/inferolateral wall from base  to apex; GLS worse in the lateral wall; c/f possible left circumflex  disease. Left  ventricular ejection fraction, by estimation, is 30 to 35%.  The left ventricle has moderately  decreased function. There is moderate asymmetric left ventricular  hypertrophy of the basal-septal segment. Left ventricular diastolic  parameters are consistent with Grade I diastolic dysfunction (impaired  relaxation).   2. Right ventricular systolic function is mildly reduced. The right  ventricular size is normal. Tricuspid regurgitation signal is inadequate  for assessing PA pressure.   3. Left atrial size was moderately dilated.   4. Trivial mitral valve regurgitation.   5. The aortic valve was not well visualized. Aortic valve regurgitation  is mild to moderate. Aortic valve sclerosis is present, with no evidence  of aortic valve stenosis.   6. The inferior vena cava is normal in size with greater than 50%  respiratory variability, suggesting right atrial pressure of 3 mmHg.   Review of Systems: [y] = yes, '[ ]'$  = no   General: Weight gain '[ ]'$ ; Weight loss [ Y]; Anorexia [ Y]; Fatigue [ Y]; Fever '[ ]'$ ; Chills '[ ]'$ ; Weakness '[ ]'$   Cardiac: Chest pain/pressure '[ ]'$ ; Resting SOB '[ ]'$ ; Exertional SOB '[ ]'$ ; Orthopnea '[ ]'$ ; Pedal Edema '[ ]'$ ; Palpitations '[ ]'$ ; Syncope '[ ]'$ ; Presyncope '[ ]'$ ; Paroxysmal nocturnal dyspnea'[ ]'$   Pulmonary: Cough '[ ]'$ ; Wheezing'[ ]'$ ; Hemoptysis'[ ]'$ ; Sputum '[ ]'$ ; Snoring '[ ]'$   GI: Vomiting'[ ]'$ ; Dysphagia'[ ]'$ ; Melena'[ ]'$ ; Hematochezia '[ ]'$ ; Heartburn'[ ]'$ ; Abdominal pain '[ ]'$ ; Constipation '[ ]'$ ; Diarrhea '[ ]'$ ;  BRBPR '[ ]'$   GU: Hematuria'[ ]'$ ; Dysuria '[ ]'$ ; Nocturia'[ ]'$   Vascular: Pain in legs with walking '[ ]'$ ; Pain in feet with lying flat '[ ]'$ ; Non-healing sores '[ ]'$ ; Stroke [ Y]; TIA '[ ]'$ ; Slurred speech '[ ]'$ ;  Neuro: Headaches[Y ]; Vertigo'[ ]'$ ; Seizures'[ ]'$ ; Paresthesias'[ ]'$ ;Blurred vision '[ ]'$ ; Diplopia '[ ]'$ ; Vision changes '[ ]'$   Ortho/Skin: Arthritis '[ ]'$ ; Joint pain [ Y]; Muscle pain '[ ]'$ ; Joint swelling '[ ]'$ ; Back Pain '[ ]'$ ; Rash '[ ]'$   Psych: Depression'[ ]'$ ; Anxiety'[ ]'$   Heme: Bleeding problems '[ ]'$ ; Clotting  disorders '[ ]'$ ; Anemia '[ ]'$   Endocrine: Diabetes [ Y]; Thyroid dysfunction'[ ]'$    Past Medical History:  Diagnosis Date   Cataracts, bilateral    Cerebral infarction due to carotid artery stenosis (HCC)    Diabetes mellitus type 2 in nonobese (HCC)    Glaucoma    Hypertension    Stroke ([place])    no residual, "series of mini strokes"   Tobacco abuse    Vertigo     Current Outpatient Medications  Medication Sig Dispense Refill   aspirin 81 MG chewable tablet Chew 1 tablet (81 mg total) by mouth daily. 30 tablet 3   carvedilol (COREG) 6.25 MG tablet Take 1 tablet (6.25 mg total) by mouth 2 (two) times daily with a meal. 60 tablet 2   clopidogrel (PLAVIX) 75 MG tablet Take 1 tablet (75 mg total) by mouth daily with breakfast. 30 tablet 3   dorzolamide (TRUSOPT) 2 % ophthalmic solution Instill 1 drop into both eyes twice a day 30 mL 3   feeding supplement, ENSURE ENLIVE, (ENSURE ENLIVE) LIQD Take 237 mLs by mouth daily.     latanoprost (XALATAN) 0.005 % ophthalmic solution Instill 1 drop into both eyes at bedtime 22.5 mL 3   rosuvastatin (CRESTOR) 40 MG tablet Take 1 tablet (40 mg total) by mouth at bedtime. 30 tablet 3   spironolactone (ALDACTONE) 25 MG tablet Take 1 tablet (25 mg total) by mouth daily. 30 tablet 2   timolol (TIMOPTIC) 0.5 % ophthalmic solution Place 1 drop into both eyes in the morning. 45 mL 3   triamcinolone cream (KENALOG) 0.1 % Apply 1 application topically 2 (two) times daily. (Patient taking differently: Apply 1 application  topically daily as needed (for rash/itching).) 30 g 0   No current facility-administered medications for this visit.    Allergies  Allergen Reactions   Lisinopril Other (See Comments)    Angioedema   Pork-Derived Products       Social History   Socioeconomic History   Marital status: Single    Spouse name: Not on file   Number of children: Not on file   Years of education: Not on file   Highest education level: Not on file   Occupational History   Occupation: retired  Tobacco Use   Smoking status: Every Day    Packs/day: 0.50    Years: 56.00    Total pack years: 28.00    Types: Cigarettes   Smokeless tobacco: Never  Vaping Use   Vaping Use: Never used  Substance and Sexual Activity   Alcohol use: No   Drug use: No   Sexual activity: Not Currently    Birth control/protection: Surgical    Comment: Hyst  Other Topics Concern   Not on file  Social History Narrative   Not on file   Social Determinants of Health   Financial Resource Strain: Low Risk  (06/12/2022)  Overall Financial Resource Strain (CARDIA)    Difficulty of Paying Living Expenses: Not very hard  Food Insecurity: No Food Insecurity (11/25/2022)   Hunger Vital Sign    Worried About Running Out of Food in the Last Year: Never true    Ran Out of Food in the Last Year: Never true  Transportation Needs: No Transportation Needs (11/25/2022)   PRAPARE - Hydrologist (Medical): No    Lack of Transportation (Non-Medical): No  Physical Activity: Not on file  Stress: Not on file  Social Connections: Not on file  Intimate Partner Violence: Not At Risk (11/25/2022)   Humiliation, Afraid, Rape, and Kick questionnaire    Fear of Current or Ex-Partner: No    Emotionally Abused: No    Physically Abused: No    Sexually Abused: No      Family History  Problem Relation Age of Onset   Dementia Mother    Kidney disease Father    Hypertension Father    Diabetes Father    Heart disease Father    Lung cancer Maternal Aunt        dx after 54   Prostate cancer Other        MGM's brother   Breast cancer Neg Hx     There were no vitals filed for this visit.  Wt Readings from Last 3 Encounters:  01/08/23 45.4 kg (100 lb)  12/11/22 45.8 kg (101 lb)  12/04/22 45.5 kg (100 lb 3.2 oz)   PHYSICAL EXAM: General:  thin female. No respiratory difficulty HEENT: normal Neck: supple. no JVD. Carotids 2+ bilat; no  bruits. No lymphadenopathy or thyromegaly appreciated. Cor: PMI nondisplaced. Regular rate & rhythm. No rubs, gallops or murmurs. Lungs: clear Abdomen: soft, nontender, nondistended. No hepatosplenomegaly. No bruits or masses. Good bowel sounds. Extremities: no cyanosis, clubbing, rash, thin extremities, no edema Neuro: alert & oriented x 3, cranial nerves grossly intact. moves all 4 extremities w/o difficulty. Affect pleasant.    ECG: Not performed.    ASSESSMENT & PLAN:  1. Chronic HFrEF Echo EF 30-35%. Suspect mixed etiology. HTN/CAD .  NYHA I-II. Mostly limited by fatigue. No dyspnea  GDMT  Diuretic-Volume status stable. Does not need loop diuretics.  BB- doesn't like taking Coreg bid, attributes to poor sleep. Agreeable to try 6.25 mg q AM and 3.125 mg a PM Ace/ARB/ARNI- Lisinopril allergy with angioedema. Not a candidate for Entresto. She stopped losartan due to extreme headache. She refuses to re challenge.  MRA- Continue spironolactone 25 mg daily  SGLT2i- Hold for now given poor appetite and concern for low volume status  Will need to carefully add meds because she is reluctant to add medications.  Recommended f/u BMP but pt refused labs today  Repeat ECHO in 3 months.  Not a candidate for advanced therapies given poor compliance.   2. CAD - S/P PCI RCA. Stable w/o CP  - LDL 134 HDL 43, recently started on Crestor  - On aspirin + plavix. No bleeding issues.  - Encouraged to quit smoking   3. HTN  - uncontrolled, remains mildly elevated, multiple med intolerances per above - GDMT per above  - advised to limit salt and quit smoking   4. H/o Breast Cancer, R S/P mastectomy  She did not receive radiation of chemo.   5. RLE Leg Pain - reports symptoms w/ ambulation concerning for claudication  - recommended LE arterial dopplers to r/o PAD. She will d/w cardiology at [employer]  appt - continue ASA, Plavix and statin - encourage to quit smoking  6. Wt Loss - poor  appetite - recommended labs but pt refused - given smoking history, I offered referral for lung cancer screen but pt also refused. Will consider and discuss further w/ PCP    Referred to HFSW (PCP, Medications, Transportation, ETOH Abuse, Drug Abuse, Insurance, Financial ): No Refer to Pharmacy: Yes Refer to Home Health:  No Refer to Advanced Heart Failure Clinic:  No  Refer to General Cardiology: Yes She has follow up with Dr Scarlette Calico  Keep scheduled f/u w/ Dr. Rush Farmer 4/15.   Amy Oncale PA-C  10:03 AM

## 2023-02-07 ENCOUNTER — Ambulatory Visit: Payer: Medicare (Managed Care) | Attending: Physician Assistant

## 2023-02-07 DIAGNOSIS — I2510 Atherosclerotic heart disease of native coronary artery without angina pectoris: Secondary | ICD-10-CM | POA: Diagnosis not present

## 2023-02-07 DIAGNOSIS — N179 Acute kidney failure, unspecified: Secondary | ICD-10-CM

## 2023-02-07 DIAGNOSIS — I5043 Acute on chronic combined systolic (congestive) and diastolic (congestive) heart failure: Secondary | ICD-10-CM

## 2023-02-08 LAB — LIPID PANEL
Chol/HDL Ratio: 2.9 ratio (ref 0.0–4.4)
Cholesterol, Total: 123 mg/dL (ref 100–199)
HDL: 43 mg/dL (ref >39)
LDL Chol Calc (NIH): 67 mg/dL (ref 0–99)
Triglycerides: 57 mg/dL (ref 0–149)
VLDL Cholesterol Cal: 13 mg/dL (ref 5–40)

## 2023-02-08 LAB — HEPATIC FUNCTION PANEL
ALT: 14 IU/L (ref 0–32)
AST: 18 IU/L (ref 0–40)
Albumin: 4.5 g/dL (ref 3.9–4.9)
Alkaline Phosphatase: 95 IU/L (ref 44–121)
Bilirubin Total: 0.2 mg/dL (ref 0.0–1.2)
Bilirubin, Direct: <0.1 mg/dL (ref 0.00–0.40)
Total Protein: 7.4 g/dL (ref 6.0–8.5)

## 2023-03-16 ENCOUNTER — Other Ambulatory Visit: Payer: Self-pay

## 2023-03-17 ENCOUNTER — Other Ambulatory Visit (HOSPITAL_COMMUNITY): Payer: Self-pay

## 2023-03-17 ENCOUNTER — Encounter: Payer: Self-pay | Admitting: Interventional Cardiology

## 2023-03-17 ENCOUNTER — Ambulatory Visit: Payer: Medicare (Managed Care) | Attending: Interventional Cardiology | Admitting: Interventional Cardiology

## 2023-03-17 VITALS — BP 174/76 | HR 81 | Ht 60.0 in | Wt 92.0 lb

## 2023-03-17 DIAGNOSIS — I5022 Chronic systolic (congestive) heart failure: Secondary | ICD-10-CM | POA: Diagnosis not present

## 2023-03-17 DIAGNOSIS — I2510 Atherosclerotic heart disease of native coronary artery without angina pectoris: Secondary | ICD-10-CM

## 2023-03-17 DIAGNOSIS — E785 Hyperlipidemia, unspecified: Secondary | ICD-10-CM | POA: Diagnosis not present

## 2023-03-17 MED ORDER — CLOPIDOGREL BISULFATE 75 MG PO TABS
75.0000 mg | ORAL_TABLET | Freq: Every day | ORAL | 3 refills | Status: DC
  Filled 2023-03-17: qty 90, 90d supply, fill #0

## 2023-03-17 MED ORDER — ATORVASTATIN CALCIUM 40 MG PO TABS
40.0000 mg | ORAL_TABLET | Freq: Every day | ORAL | 3 refills | Status: DC
  Filled 2023-03-17: qty 90, 90d supply, fill #0

## 2023-03-17 MED ORDER — CARVEDILOL 6.25 MG PO TABS
6.2500 mg | ORAL_TABLET | Freq: Two times a day (BID) | ORAL | 3 refills | Status: DC
  Filled 2023-03-17: qty 180, 90d supply, fill #0
  Filled 2023-07-07: qty 180, 90d supply, fill #1

## 2023-03-17 NOTE — Patient Instructions (Signed)
Medication Instructions:  Your physician has recommended you make the following change in your medication:  Stop Rosuvastatin. Start Atorvastatin 40 mg by mouth daily   *If you need a refill on your cardiac medications before your next appointment, please call your pharmacy*   Lab Work: Your physician recommends that you return for lab work on June 16, 2023.  Lipid and liver profiles.  This will be fasting.  You can stop by the office anytime after 7:15 AM for lab work  If you have labs (blood work) drawn today and your tests are completely normal, you will receive your results only by: MyChart Message (if you have MyChart) OR A paper copy in the mail If you have any lab test that is abnormal or we need to change your treatment, we will call you to review the results.   Testing/Procedures: none   Follow-Up: At [facility], you and your health needs are our priority.  As part of our continuing mission to provide you with exceptional heart care, we have created designated Provider Care Teams.  These Care Teams include your primary Cardiologist (physician) and Advanced Practice Providers (APPs -  Physician Assistants and Nurse Practitioners) who all work together to provide you with the care you need, when you need it.  We recommend signing up for the patient portal called "MyChart".  Sign up information is provided on this After Visit Summary.  MyChart is used to connect with patients for Virtual Visits (Telemedicine).  Patients are able to view lab/test results, encounter notes, upcoming appointments, etc.  Non-urgent messages can be sent to your provider as well.   To learn more about what you can do with MyChart, go to [email or web address].    Your next appointment:   8 month(s)--December 2024  Provider:   Lance Muss, MD     Other Instructions  Check blood pressure at home and keep record of readings. Send in through my chart or call to office.

## 2023-03-17 NOTE — Progress Notes (Signed)
Cardiology Office Note   Date:  03/17/2023   ID:  Amy Howell, DOB 1956/10/06, MRN [id number]  PCP:  Marcine Matar, MD    No chief complaint on file.  CAD  Wt Readings from Last 3 Encounters:  03/17/23 92 lb (41.7 kg)  02/04/23 96 lb 9.6 oz (43.8 kg)  01/08/23 100 lb (45.4 kg)       History of Present Illness: Amy Howell is a 67 y.o. female   with a past medical history significant for hypertension, stroke, vertigo, carotid artery stenosis, and cataracts presents today for follow-up appointment status post PCI   Was last seen by Dr. Johney Frame in 2020 and at that time was doing well.   She presented to the ER in Dec 2023 with 2 weeks of orthopnea and dyspnea on exertion.  In the ER found to have SpO2 70s, BNP 1500, BP greater than 200 mmHg and CXR with congestion and cardiomegaly.  Admitted and started on Lasix.  She ultimately underwent cardiac catheterization which showed RCA stenosis and was stented.  Her CHF symptoms resolved and her dyspnea resolved.  Cath showed: " 1st Mrg lesion is 50% stenosed.   Mid RCA lesion is 75% stenosed.  A drug-eluting stent was successfully placed using a SYNERGY XD 3.0X20, postdilated to greater than 3.5 mm.   Post intervention, there is a 0% residual stenosis.   There is moderate left ventricular systolic dysfunction.   LV end diastolic pressure is moderately elevated.   The left ventricular ejection fraction is 35-45% by visual estimate.   There is no aortic valve stenosis.   Right ulnar artery looks slightly larger.  There was some tortuosity and a branch vessel which the wire selected initially.  With a JR 4 catheter versa core wire or was was directed more meat daily and went into the radial artery.   Aortic saturation 96%, PA saturation 70%, PA pressure 29/12, mean PA pressure 20 mmHg, mean pulmonary capillary wedge pressure 5 mmHg, mean right atrial pressure 1 mmHg, cardiac output 5.02 L/min, cardiac index 3.58.   Normal right  heart pressures.   Successful PCI of the mid RCA.  Will change antiplatelet therapy to aspirin 81 mg daily and clopidogrel 75 mg daily.  Would have her stop the Brilinta 45 mg twice daily dose.  She will need aggressive secondary prevention.  She will need aggressive medical therapy for her LV dysfunction."  She has not been checking at home.  She states BP is better outside of the doctor's office.    She is walking regularly. Denies : Chest pain. Dizziness. Leg edema. Nitroglycerin use. Orthopnea. Palpitations. Paroxysmal nocturnal dyspnea. Shortness of breath. Syncope.    Past Medical History:  Diagnosis Date   Cataracts, bilateral    Cerebral infarction due to carotid artery stenosis    Diabetes mellitus type 2 in nonobese    Glaucoma    Hypertension    Stroke    no residual, "series of mini strokes"   Tobacco abuse    Vertigo     Past Surgical History:  Procedure Laterality Date   ABDOMINAL HYSTERECTOMY     CARDIAC DEFIBRILLATOR PLACEMENT     CORONARY STENT INTERVENTION N/A 11/26/2022   Procedure: CORONARY STENT INTERVENTION;  Surgeon: Corky Crafts, MD;  Location: [facility];  Service: Cardiovascular;  Laterality: N/A;   gum operation     Implantable loop recorder removal  02/01/2019   MDT Reveal LINQ explantation in office by [facility]  IR 3D INDEPENDENT WKST  05/07/2017   IR ANGIO INTRA EXTRACRAN SEL COM CAROTID INNOMINATE BILAT MOD SED  04/23/2017   IR ANGIO INTRA EXTRACRAN SEL COM CAROTID INNOMINATE BILAT MOD SED  08/06/2017   IR ANGIO INTRA EXTRACRAN SEL COM CAROTID INNOMINATE UNI R MOD SED  05/07/2017   IR ANGIO VERTEBRAL SEL VERTEBRAL BILAT MOD SED  04/23/2017   IR ANGIO VERTEBRAL SEL VERTEBRAL BILAT MOD SED  08/06/2017   IR ANGIOGRAM FOLLOW UP STUDY  05/07/2017   IR ANGIOGRAM SELECTIVE EACH ADDITIONAL VESSEL  05/07/2017   IR RADIOLOGIST EVAL & MGMT  04/16/2017   IR RADIOLOGIST EVAL & MGMT  05/29/2017   IR TRANSCATH/EMBOLIZ  05/07/2017   LOOP RECORDER INSERTION N/A  03/28/2017   Procedure: Loop Recorder Insertion;  Surgeon: Hillis Range, MD;  Location: [facility];  Service: Cardiovascular;  Laterality: N/A;   MASTECTOMY W/ SENTINEL NODE BIOPSY Right 06/25/2022   Procedure: RIGHT MASTECTOMY WITH AXILLARY SENTINEL LYMPH NODE BIOPSY;  Surgeon: Manus Rudd, MD;  Location: [facility];  Service: General;  Laterality: Right;   RADIOLOGY WITH ANESTHESIA N/A 05/07/2017   Procedure: EMBOLIZATION;  Surgeon: Julieanne Cotton, MD;  Location: [facility] OR;  Service: Radiology;  Laterality: N/A;   RADIOLOGY WITH ANESTHESIA N/A 08/06/2017   Procedure: RADIOLOGY WITH ANESTHESIA STENTING;  Surgeon: Julieanne Cotton, MD;  Location: [facility] OR;  Service: Radiology;  Laterality: N/A;   RIGHT/LEFT HEART CATH AND CORONARY ANGIOGRAPHY N/A 11/26/2022   Procedure: RIGHT/LEFT HEART CATH AND CORONARY ANGIOGRAPHY;  Surgeon: Corky Crafts, MD;  Location[facility]IVE CV LAB;  Service: Cardiovascular;  Laterality: N/A;   TEE WITHOUT CARDIOVERSION N/A 03/28/2017   Procedure: TRANSESOPHAGEAL ECHOCARDIOGRAM (TEE);  Surgeon: Lewayne Bunting, MD;  Location: [facility]OSCOPY;  Service: Cardiovascular;  Laterality: N/A;     Current Outpatient Medications  Medication Sig Dispense Refill   aspirin 81 MG chewable tablet Chew 1 tablet (81 mg total) by mouth daily. 30 tablet 3   atorvastatin (LIPITOR) 40 MG tablet Take 1 tablet (40 mg total) by mouth daily. 90 tablet 3   dorzolamide (TRUSOPT) 2 % ophthalmic solution Instill 1 drop into both eyes twice a day 30 mL 3   feeding supplement, ENSURE ENLIVE, (ENSURE ENLIVE) LIQD Take 237 mLs by mouth daily.     Garlic (GARLIQUE PO) Patient takes 1 tablet by mouth daily.     latanoprost (XALATAN) 0.005 % ophthalmic solution Instill 1 drop into both eyes at bedtime 22.5 mL 3   spironolactone (ALDACTONE) 25 MG tablet Take 1 tablet (25 mg total) by mouth daily. 30 tablet 2   timolol (TIMOPTIC) 0.5 % ophthalmic solution Place 1 drop into both  eyes in the morning. 45 mL 3   triamcinolone cream (KENALOG) 0.1 % Apply 1 application topically 2 (two) times daily. (Patient taking differently: Apply 1 application  topically daily as needed (for rash/itching).) 30 g 0   carvedilol (COREG) 6.25 MG tablet Take 1 tablet (6.25 mg total) by mouth 2 (two) times daily. 180 tablet 3   clopidogrel (PLAVIX) 75 MG tablet Take 1 tablet (75 mg total) by mouth daily with breakfast. 90 tablet 3   No current facility-administered medications for this visit.    Allergies:   Lisinopril, Pork-derived products, and Rosuvastatin    Social History:  The patient  reports that she has been smoking cigarettes. She has a 28.00 pack-year smoking history. She has never used smokeless tobacco. She reports that she does not drink alcohol and  does not use drugs.   Family History:  The patient's family history includes Dementia in her mother; Diabetes in her father; Heart disease in her father; Hypertension in her father; Kidney disease in her father; Lung cancer in her maternal aunt; Prostate cancer in an other family member.    ROS:  Please see the history of present illness.   Otherwise, review of systems are positive for weight loss.   All other systems are reviewed and negative.    PHYSICAL EXAM: VS:  BP (!) 174/76   Pulse 81   Ht 5' (1.524 m)   Wt 92 lb (41.7 kg)   SpO2 98%   BMI 17.97 kg/m  , BMI Body mass index is 17.97 kg/m. GEN: Well nourished, well developed, in no acute distress HEENT: normal Neck: no JVD, carotid bruits, or masses Cardiac: RRR; no murmurs, rubs, or gallops,no edema  Respiratory:  clear to auscultation bilaterally, normal work of breathing GI: soft, nontender, nondistended, + BS MS: no deformity or atrophy Skin: warm and dry, no rash Neuro:  Strength and sensation are intact Psych: euthymic mood, full affect   Recent Labs: 11/22/2022: TSH 0.506 11/26/2022: Hemoglobin 11.2; Hemoglobin 11.6; Magnesium 2.2; Platelets  136 01/08/2023: B Natriuretic Peptide 748.9; BUN 24; Creatinine, Ser 1.32; Potassium 4.6; Sodium 135 02/07/2023: ALT 14   Lipid Panel    Component Value Date/Time   CHOL 123 02/07/2023 0829   TRIG 57 02/07/2023 0829   HDL 43 02/07/2023 0829   CHOLHDL 2.9 02/07/2023 0829   CHOLHDL 4.5 11/22/2022 0609   VLDL 16 11/22/2022 0609   LDLCALC 67 02/07/2023 0829     Other studies Reviewed: Additional studies/ records that were reviewed today with results demonstrating: Hospital records reviewed.  .   ASSESSMENT AND PLAN:  CAD: Status post RCA stent.  No angina.  Continue aggressive secondary prevention.  She was not interested in cardiac rehab.  I stressed the importance of clopidogrel.  We talked about dual antiplatelet therapy.  She has a history of coils for a cerebral aneurysm done by Dr. Sonny Masters wire.  She was on high-dose aspirin at that time.  Now she is on low-dose aspirin with clopidogrel.  No bleeding problems.  Could stop aspirin if there were any bleeding issues. Chronic combined systolic/diastolic heart failure: Appears euvolemic.  EF improved somewhat on the repeat echocardiogram.  Continue current medications.  She does not want to take any additional medication if possible. Hyperlipidemia: She did not tolerate Crestor due to perceived side effects.  She thought she was having headaches from the Crestor.  She is willing to try atorvastatin 40 mg daily. Hypertension: She does not like taking medications.  I stressed the importance of compliance with her medicines to prevent stroke.  She thinks her home blood pressures are much lower than what she would get in the doctor's office.  Daughter will send and some home readings via MyChart.  If they are elevated, would have to consider increasing carvedilol to twice a day.  In general, the patient wants to come off of medicines rather than increase doses.   Current medicines are reviewed at length with the patient today.  The patient concerns  regarding her medicines were addressed.  The following changes have been made:  No change  Labs/ tests ordered today include:   Orders Placed This Encounter  Procedures   Lipid Profile   Hepatic function panel   EKG 12-Lead    Recommend 150 minutes/week of aerobic exercise Low fat,  low carb, high fiber diet recommended  Disposition:   FU in Dec 2024   Signed, Lance Muss, MD  03/17/2023 4:45 PM    [facility] [address] [place], [place], [place]  [address] Phone: [phone]; Fax: [phone]

## 2023-03-20 ENCOUNTER — Other Ambulatory Visit (HOSPITAL_COMMUNITY): Payer: Self-pay

## 2023-04-08 ENCOUNTER — Encounter: Payer: Self-pay | Admitting: Student

## 2023-04-08 ENCOUNTER — Ambulatory Visit (INDEPENDENT_AMBULATORY_CARE_PROVIDER_SITE_OTHER): Payer: Medicare (Managed Care)

## 2023-04-08 ENCOUNTER — Ambulatory Visit (INDEPENDENT_AMBULATORY_CARE_PROVIDER_SITE_OTHER): Payer: Medicare (Managed Care) | Admitting: Student

## 2023-04-08 VITALS — BP 157/80 | HR 73 | Ht 60.0 in | Wt 95.3 lb

## 2023-04-08 DIAGNOSIS — C50311 Malignant neoplasm of lower-inner quadrant of right female breast: Secondary | ICD-10-CM | POA: Diagnosis not present

## 2023-04-08 DIAGNOSIS — F17210 Nicotine dependence, cigarettes, uncomplicated: Secondary | ICD-10-CM | POA: Diagnosis not present

## 2023-04-08 DIAGNOSIS — Z0000 Encounter for general adult medical examination without abnormal findings: Secondary | ICD-10-CM

## 2023-04-08 DIAGNOSIS — E119 Type 2 diabetes mellitus without complications: Secondary | ICD-10-CM | POA: Diagnosis not present

## 2023-04-08 DIAGNOSIS — E785 Hyperlipidemia, unspecified: Secondary | ICD-10-CM

## 2023-04-08 DIAGNOSIS — I10 Essential (primary) hypertension: Secondary | ICD-10-CM

## 2023-04-08 DIAGNOSIS — Z1211 Encounter for screening for malignant neoplasm of colon: Secondary | ICD-10-CM | POA: Insufficient documentation

## 2023-04-08 NOTE — Progress Notes (Signed)
Subjective:   Amy Howell is a 67 y.o. female who presents for an Initial Medicare Annual Wellness Visit. I connected with  [name] on 04/08/23 by a  Face-To-Face encounter  and verified that I am speaking with the correct person using two identifiers.  Patient Location: Other:  Office/Clinic  Provider Location: Office/Clinic  I discussed the limitations of evaluation and management by telemedicine. The patient expressed understanding and agreed to proceed.  Review of Systems    Defer to PCP       Objective:    Today's Vitals   04/08/23 1639 04/08/23 1640 04/08/23 1641  BP: (!) 182/78 (!) 172/86 (!) 157/80  Pulse: 79  73  SpO2: 100%    Weight: 95 lb 4.8 oz (43.2 kg)    Height: 5' (1.524 m)     Body mass index is 18.61 kg/m.     04/08/2023    4:41 PM 04/08/2023    3:42 PM 11/22/2022    3:15 PM 11/21/2022    3:20 AM 06/25/2022    6:35 AM 06/21/2022    9:47 AM 06/18/2022   12:31 PM  Advanced Directives  Does Patient Have a Medical Advance Directive? No No No No No No No  Would patient like information on creating a medical advance directive? No - Patient declined No - Patient declined No - Patient declined Yes (ED - Information included in AVS) No - Patient declined No - Patient declined No - Patient declined    Current Medications (verified) Outpatient Encounter Medications as of 04/08/2023  Medication Sig   aspirin 81 MG chewable tablet Chew 1 tablet (81 mg total) by mouth daily.   atorvastatin (LIPITOR) 40 MG tablet Take 1 tablet (40 mg total) by mouth daily.   carvedilol (COREG) 6.25 MG tablet Take 1 tablet (6.25 mg total) by mouth 2 (two) times daily.   clopidogrel (PLAVIX) 75 MG tablet Take 1 tablet (75 mg total) by mouth daily with breakfast.   dorzolamide (TRUSOPT) 2 % ophthalmic solution Instill 1 drop into both eyes twice a day   feeding supplement, ENSURE ENLIVE, (ENSURE ENLIVE) LIQD Take 237 mLs by mouth daily.   Garlic (GARLIQUE PO) Patient takes 1  tablet by mouth daily.   latanoprost (XALATAN) 0.005 % ophthalmic solution Instill 1 drop into both eyes at bedtime   spironolactone (ALDACTONE) 25 MG tablet Take 1 tablet (25 mg total) by mouth daily.   timolol (TIMOPTIC) 0.5 % ophthalmic solution Place 1 drop into both eyes in the morning.   triamcinolone cream (KENALOG) 0.1 % Apply 1 application topically 2 (two) times daily. (Patient taking differently: Apply 1 application  topically daily as needed (for rash/itching).)   No facility-administered encounter medications on file as of 04/08/2023.    Allergies (verified) Lisinopril, Pork-derived products, and Rosuvastatin   History: Past Medical History:  Diagnosis Date   Cataracts, bilateral    Cerebral infarction due to carotid artery stenosis (HCC)    Diabetes mellitus type 2 in nonobese (HCC)    Glaucoma    Hypertension    Stroke (HCC)    no residual, "series of mini strokes"   Tobacco abuse    Vertigo    Past Surgical History:  Procedure Laterality Date   ABDOMINAL HYSTERECTOMY     CARDIAC DEFIBRILLATOR PLACEMENT     CORONARY STENT INTERVENTION N/A 11/26/2022   Procedure: CORONARY STENT INTERVENTION;  Surgeon: Corky Crafts, MD;  Location: [facility];  Service: Cardiovascular;  Laterality: N/A;  gum operation     Implantable loop recorder removal  02/01/2019   MDT Reveal LINQ explantation in office by JA   IR 3D INDEPENDENT WKST  05/07/2017   IR ANGIO INTRA EXTRACRAN SEL COM CAROTID INNOMINATE BILAT MOD SED  04/23/2017   IR ANGIO INTRA EXTRACRAN SEL COM CAROTID INNOMINATE BILAT MOD SED  08/06/2017   IR ANGIO INTRA EXTRACRAN SEL COM CAROTID INNOMINATE UNI R MOD SED  05/07/2017   IR ANGIO VERTEBRAL SEL VERTEBRAL BILAT MOD SED  04/23/2017   IR ANGIO VERTEBRAL SEL VERTEBRAL BILAT MOD SED  08/06/2017   IR ANGIOGRAM FOLLOW UP STUDY  05/07/2017   IR ANGIOGRAM SELECTIVE EACH ADDITIONAL VESSEL  05/07/2017   IR RADIOLOGIST EVAL & MGMT  04/16/2017   IR RADIOLOGIST EVAL & MGMT   05/29/2017   IR TRANSCATH/EMBOLIZ  05/07/2017   LOOP RECORDER INSERTION N/A 03/28/2017   Procedure: Loop Recorder Insertion;  Surgeon: Hillis Range, MD;  Location: [facility];  Service: Cardiovascular;  Laterality: N/A;   MASTECTOMY W/ SENTINEL NODE BIOPSY Right 06/25/2022   Procedure: RIGHT MASTECTOMY WITH AXILLARY SENTINEL LYMPH NODE BIOPSY;  Surgeon: Manus Rudd, MD;  Location: [facility];  Service: General;  Laterality: Right;   RADIOLOGY WITH ANESTHESIA N/A 05/07/2017   Procedure: EMBOLIZATION;  Surgeon: Julieanne Cotton, MD;  Location: [facility];  Service: Radiology;  Laterality: N/A;   RADIOLOGY WITH ANESTHESIA N/A 08/06/2017   Procedure: RADIOLOGY WITH ANESTHESIA STENTING;  Surgeon: Julieanne Cotton, MD;  Location: [facility];  Service: Radiology;  Laterality: N/A;   RIGHT/LEFT HEART CATH AND CORONARY ANGIOGRAPHY N/A 11/26/2022   Procedure: RIGHT/LEFT HEART CATH AND CORONARY ANGIOGRAPHY;  Surgeon: Corky Crafts, MD;  Location: [facility];  Service: Cardiovascular;  Laterality: N/A;   TEE WITHOUT CARDIOVERSION N/A 03/28/2017   Procedure: TRANSESOPHAGEAL ECHOCARDIOGRAM (TEE);  Surgeon: Lewayne Bunting, MD;  Location: [facility];  Service: Cardiovascular;  Laterality: N/A;   Family History  Problem Relation Age of Onset   Dementia Mother    Kidney disease Father    Hypertension Father    Diabetes Father    Heart disease Father    Lung cancer Maternal Aunt        dx after 36   Prostate cancer Other        MGM's brother   Breast cancer Neg Hx    Social History   Socioeconomic History   Marital status: Single    Spouse name: Not on file   Number of children: Not on file   Years of education: Not on file   Highest education level: Not on file  Occupational History   Occupation: retired  Tobacco Use   Smoking status: Every Day    Packs/day: 0.50    Years: 56.00    Additional pack years: 0.00    Total pack years: 28.00    Types: Cigarettes    Smokeless tobacco: Never  Vaping Use   Vaping Use: Never used  Substance and Sexual Activity   Alcohol use: No   Drug use: No   Sexual activity: Not Currently    Birth control/protection: Surgical    Comment: Hyst  Other Topics Concern   Not on file  Social History Narrative   Not on file   Social Determinants of Health   Financial Resource Strain: Medium Risk (04/08/2023)   Overall Financial Resource Strain (CARDIA)    Difficulty of Paying Living Expenses: Somewhat hard  Food Insecurity: No Food Insecurity (04/08/2023)  Hunger Vital Sign    Worried About Running Out of Food in the Last Year: Never true    Ran Out of Food in the Last Year: Never true  Transportation Needs: No Transportation Needs (04/08/2023)   PRAPARE - Administrator, Civil Service (Medical): No    Lack of Transportation (Non-Medical): No  Physical Activity: Insufficiently Active (04/08/2023)   Exercise Vital Sign    Days of Exercise per Week: 3 days    Minutes of Exercise per Session: 30 min  Stress: No Stress Concern Present (04/08/2023)   [employer] of Occupational Health - Occupational Stress Questionnaire    Feeling of Stress : Not at all  Social Connections: Socially Isolated (04/08/2023)   Social Connection and Isolation Panel [NHANES]    Frequency of Communication with Friends and Family: Three times a week    Frequency of Social Gatherings with Friends and Family: Twice a week    Attends Religious Services: Never    Database administrator or Organizations: No    Attends Engineer, structural: Never    Marital Status: Never married    Tobacco Counseling Ready to quit: Not Answered Counseling given: Not Answered   Clinical Intake:  Pre-visit preparation completed: Yes  Pain : No/denies pain     Nutritional Risks: None Diabetes: No  How often do you need to have someone help you when you read instructions, pamphlets, or other written materials from your doctor or  pharmacy?: 1 - Never  Diabetic?No  Interpreter Needed?: No  Information entered by :: [place],cma   Activities of Daily Living    04/08/2023    4:41 PM 04/08/2023    3:42 PM  In your present state of health, do you have any difficulty performing the following activities:  Hearing? 0 0  Vision? 0 0  Difficulty concentrating or making decisions? 0 0  Walking or climbing stairs? 0 0  Dressing or bathing? 0 0  Doing errands, shopping? 0 0    Patient Care Team: Rana Snare, DO as PCP - General Corky Crafts, MD as PCP - Cardiology (Cardiology) Manus Rudd, MD as Consulting Physician (General Surgery) Rachel Moulds, MD as Consulting Physician (Hematology and Oncology) Dorothy Puffer, MD as Consulting Physician (Radiation Oncology)  Indicate any recent Medical Services you may have received from other than [facility] providers in the past year (date may be approximate).     Assessment:   This is a routine wellness examination for Jonet.  Hearing/Vision screen No results found.  Dietary issues and exercise activities discussed:     Goals Addressed   None   Depression Screen    04/08/2023    4:41 PM 04/08/2023    3:41 PM 04/12/2022    1:53 PM 10/31/2020    1:49 PM 01/08/2019    4:00 PM 08/20/2018   11:05 AM 03/30/2018    9:09 AM  PHQ 2/9 Scores  PHQ - 2 Score 0 0 0 2 2 0 0  PHQ- 9 Score   0 2 3      Fall Risk    04/08/2023    4:41 PM 04/08/2023    3:41 PM 04/12/2022    1:52 PM 06/28/2021    3:04 PM 10/31/2020    1:49 PM  Fall Risk   Falls in the past year? 0 0 0 0 0  Number falls in past yr: 0 0 0 0 0  Injury with Fall? 0 0 0 0 0  Risk for fall due to : No Fall Risks No Fall Risks     Follow up Falls evaluation completed;Falls prevention discussed Falls evaluation completed;Falls prevention discussed       FALL RISK PREVENTION PERTAINING TO THE HOME:  Any stairs in or around the home? No  If so, are there any without handrails? No  Home free of loose  throw rugs in walkways, pet beds, electrical cords, etc? No  Adequate lighting in your home to reduce risk of falls? Yes   ASSISTIVE DEVICES UTILIZED TO PREVENT FALLS:  Life alert? No  Use of a cane, walker or w/c? No  Grab bars in the bathroom? No  Shower chair or bench in shower? Yes  Elevated toilet seat or a handicapped toilet? No   TIMED UP AND GO:  Was the test performed? No .  Length of time to ambulate 10 feet: 0 sec.   Gait slow and steady without use of assistive device  Cognitive Function:        04/08/2023    4:42 PM  6CIT Screen  What Year? 0 points  What month? 0 points  What time? 0 points  Count back from 20 0 points  Months in reverse 0 points  Repeat phrase 0 points  Total Score 0 points    Immunizations Immunization History  Administered Date(s) Administered   PFIZER(Purple Top)SARS-COV-2 Vaccination 02/18/2020, 03/10/2020    TDAP status: Due, Education has been provided regarding the importance of this vaccine. Advised may receive this vaccine at local pharmacy or Health Dept. Aware to provide a copy of the vaccination record if obtained from local pharmacy or Health Dept. Verbalized acceptance and understanding.  Flu Vaccine status: Up to date  Pneumococcal vaccine status: Due, Education has been provided regarding the importance of this vaccine. Advised may receive this vaccine at local pharmacy or Health Dept. Aware to provide a copy of the vaccination record if obtained from local pharmacy or Health Dept. Verbalized acceptance and understanding.  Covid-19 vaccine status: Completed vaccines  Qualifies for Shingles Vaccine? No   Zostavax completed No   Shingrix Completed?: No.    Education has been provided regarding the importance of this vaccine. Patient has been advised to call insurance company to determine out of pocket expense if they have not yet received this vaccine. Advised may also receive vaccine at local pharmacy or Health Dept.  Verbalized acceptance and understanding.  Screening Tests Health Maintenance  Topic Date Due   Pneumonia Vaccine 61+ Years old (1 of 2 - PCV) Never done   Hepatitis C Screening  Never done   DTaP/Tdap/Td (1 - Tdap) Never done   Zoster Vaccines- Shingrix (1 of 2) Never done   COLONOSCOPY (Pts 45-7yrs Insurance coverage will need to be confirmed)  Never done   Lung Cancer Screening  03/27/2018   COVID-19 Vaccine (3 - Pfizer risk series) 04/07/2020   Diabetic kidney evaluation - Urine ACR  07/14/2020   OPHTHALMOLOGY EXAM  03/30/2023   DEXA SCAN  04/13/2023 (Originally 12/06/2018)   FOOT EXAM  04/13/2023   HEMOGLOBIN A1C  05/23/2023   INFLUENZA VACCINE  07/03/2023   Diabetic kidney evaluation - eGFR measurement  01/09/2024   Medicare Annual Wellness (AWV)  04/07/2024   MAMMOGRAM  05/09/2024   HPV VACCINES  Aged Out    Health Maintenance  Health Maintenance Due  Topic Date Due   Pneumonia Vaccine 56+ Years old (1 of 2 - PCV) Never done   Hepatitis C Screening  Never done   DTaP/Tdap/Td (1 - Tdap) Never done   Zoster Vaccines- Shingrix (1 of 2) Never done   COLONOSCOPY (Pts 45-23yrs Insurance coverage will need to be confirmed)  Never done   Lung Cancer Screening  03/27/2018   COVID-19 Vaccine (3 - Pfizer risk series) 04/07/2020   Diabetic kidney evaluation - Urine ACR  07/14/2020   OPHTHALMOLOGY EXAM  03/30/2023   Mammogram status: Completed 05/09/2022. Repeat every year:2    Lung Cancer Screening: (Low Dose CT Chest recommended if Age 70-80 years, 30 pack-year currently smoking OR have quit w/in 15years.) does not qualify.   Lung Cancer Screening Referral: N/A  Additional Screening:  Vision Screening: Recommended annual ophthalmology exams for early detection of glaucoma and other disorders of the eye. Is the patient up to date with their annual eye exam?  Yes  Who is the provider or what is the name of the office in which the patient attends annual eye exams?  Dr.Groat If pt is not established with a provider, would they like to be referred to a provider to establish care? No .   Dental Screening: Recommended annual dental exams for proper oral hygiene  Community Resource Referral / Chronic Care Management: CRR required this visit?  No   CCM required this visit?  No      Plan:     I have personally reviewed and noted the following in the patient's chart:   Medical and social history Use of alcohol, tobacco or illicit drugs  Current medications and supplements including opioid prescriptions. Patient is not currently taking opioid prescriptions. Functional ability and status Nutritional status Physical activity Advanced directives List of other physicians Hospitalizations, surgeries, and ER visits in previous 12 months Vitals Screenings to include cognitive, depression, and falls Referrals and appointments  In addition, I have reviewed and discussed with patient certain preventive protocols, quality metrics, and best practice recommendations. A written personalized care plan for preventive services as well as general preventive health recommendations were provided to patient.     Cala Bradford, [facility]   04/08/2023   Nurse Notes: Face-To-Face Visit  Ms. Burningham , Thank you for taking time to come for your Medicare Wellness Visit. I appreciate your ongoing commitment to your health goals. Please review the following plan we discussed and let me know if I can assist you in the future.   These are the goals we discussed:  Goals   None     This is a list of the screening recommended for you and due dates:  Health Maintenance  Topic Date Due   Pneumonia Vaccine (1 of 2 - PCV) Never done   Hepatitis C Screening: USPSTF Recommendation to screen - Ages 56-79 yo.  Never done   DTaP/Tdap/Td vaccine (1 - Tdap) Never done   Zoster (Shingles) Vaccine (1 of 2) Never done   Colon Cancer Screening  Never done   Screening for Lung Cancer   03/27/2018   COVID-19 Vaccine (3 - Pfizer risk series) 04/07/2020   Yearly kidney health urinalysis for diabetes  07/14/2020   Eye exam for diabetics  03/30/2023   DEXA scan (bone density measurement)  04/13/2023*   Complete foot exam   04/13/2023   Hemoglobin A1C  05/23/2023   Flu Shot  07/03/2023   Yearly kidney function blood test for diabetes  01/09/2024   Medicare Annual Wellness Visit  04/07/2024   Mammogram  05/09/2024   HPV Vaccine  Aged Out  *Topic was postponed. The date  shown is not the original due date.

## 2023-04-08 NOTE — Assessment & Plan Note (Signed)
Last A1c 5.7% in 11/2022. Not on any medications at this time. Will continue to monitor.

## 2023-04-08 NOTE — Assessment & Plan Note (Signed)
Patient agreeable today for colon cancer screening. Referral to GI for colonoscopy sent.

## 2023-04-08 NOTE — Assessment & Plan Note (Addendum)
S/p right breast mastectomy in 06/2022. She was following with oncology, last OV was in 07/2022 with planned 3 mth f/u. Has not had f/u since 07/2022 and she states she was unaware she needed further f/u. Advised patient to schedule f/u with her oncologist. Daughter will call oncology office to schedule appointment.

## 2023-04-08 NOTE — Progress Notes (Addendum)
CC: establish care today  HPI:  [name] is a 67 y.o. female living with a history stated below and presents today for establishing care. Please see problem based assessment and plan for additional details.  Daughter was present during encounter.   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 -CVA -R breast cancer s/p R mastectomy -HFrEF (EF 40-45% on 01/2023) -CAD s/p PCI RCA -cataracts -tobacco use  PSH: Past Surgical History:  Procedure Laterality Date   ABDOMINAL HYSTERECTOMY     CARDIAC DEFIBRILLATOR PLACEMENT     CORONARY STENT INTERVENTION N/A 11/26/2022   Procedure: CORONARY STENT INTERVENTION;  Surgeon: Corky Crafts, MD;  Location: [facility];  Service: Cardiovascular;  Laterality: N/A;   gum operation     Implantable loop recorder removal  02/01/2019   MDT Reveal LINQ explantation in office by JA   IR 3D INDEPENDENT WKST  05/07/2017   IR ANGIO INTRA EXTRACRAN SEL COM CAROTID INNOMINATE BILAT MOD SED  04/23/2017   IR ANGIO INTRA EXTRACRAN SEL COM CAROTID INNOMINATE BILAT MOD SED  08/06/2017   IR ANGIO INTRA EXTRACRAN SEL COM CAROTID INNOMINATE UNI R MOD SED  05/07/2017   IR ANGIO VERTEBRAL SEL VERTEBRAL BILAT MOD SED  04/23/2017   IR ANGIO VERTEBRAL SEL VERTEBRAL BILAT MOD SED  08/06/2017   IR ANGIOGRAM FOLLOW UP STUDY  05/07/2017   IR ANGIOGRAM SELECTIVE EACH ADDITIONAL VESSEL  05/07/2017   IR RADIOLOGIST EVAL & MGMT  04/16/2017   IR RADIOLOGIST EVAL & MGMT  05/29/2017   IR TRANSCATH/EMBOLIZ  05/07/2017   LOOP RECORDER INSERTION N/A 03/28/2017   Procedure: Loop Recorder Insertion;  Surgeon: Hillis Range, MD;  Location: [facility];  Service: Cardiovascular;  Laterality: N/A;   MASTECTOMY W/ SENTINEL NODE BIOPSY Right 06/25/2022   Procedure: RIGHT MASTECTOMY WITH AXILLARY SENTINEL LYMPH NODE BIOPSY;  Surgeon: Manus Rudd, MD;  Location: [facility];  Service: General;  Laterality: Right;   RADIOLOGY WITH  ANESTHESIA N/A 05/07/2017   Procedure: EMBOLIZATION;  Surgeon: Julieanne Cotton, MD;  Location: [facility];  Service: Radiology;  Laterality: N/A;   RADIOLOGY WITH ANESTHESIA N/A 08/06/2017   Procedure: RADIOLOGY WITH ANESTHESIA STENTING;  Surgeon: Julieanne Cotton, MD;  Location: [facility];  Service: Radiology;  Laterality: N/A;   RIGHT/LEFT HEART CATH AND CORONARY ANGIOGRAPHY N/A 11/26/2022   Procedure: RIGHT/LEFT HEART CATH AND CORONARY ANGIOGRAPHY;  Surgeon: Corky Crafts, MD;  Location: [facility] INVASIVE CV LAB;  Service: Cardiovascular;  Laterality: N/A;   TEE WITHOUT CARDIOVERSION N/A 03/28/2017   Procedure: TRANSESOPHAGEAL ECHOCARDIOGRAM (TEE);  Surgeon: Lewayne Bunting, MD;  Location: [facility] ENDOSCOPY;  Service: Cardiovascular;  Laterality: N/A;   Meds: Current Outpatient Medications on File Prior to Visit  Medication Sig Dispense Refill   aspirin 81 MG chewable tablet Chew 1 tablet (81 mg total) by mouth daily. 30 tablet 3   atorvastatin (LIPITOR) 40 MG tablet Take 1 tablet (40 mg total) by mouth daily. 90 tablet 3   carvedilol (COREG) 6.25 MG tablet Take 1 tablet (6.25 mg total) by mouth 2 (two) times daily. 180 tablet 3   clopidogrel (PLAVIX) 75 MG tablet Take 1 tablet (75 mg total) by mouth daily with breakfast. 90 tablet 3   dorzolamide (TRUSOPT) 2 % ophthalmic solution Instill 1 drop into both eyes twice a day 30 mL 3   feeding supplement, ENSURE ENLIVE, (ENSURE ENLIVE) LIQD Take 237 mLs by mouth daily.     Garlic (GARLIQUE PO)  Patient takes 1 tablet by mouth daily.     latanoprost (XALATAN) 0.005 % ophthalmic solution Instill 1 drop into both eyes at bedtime 22.5 mL 3   spironolactone (ALDACTONE) 25 MG tablet Take 1 tablet (25 mg total) by mouth daily. 30 tablet 2   timolol (TIMOPTIC) 0.5 % ophthalmic solution Place 1 drop into both eyes in the morning. 45 mL 3   triamcinolone cream (KENALOG) 0.1 % Apply 1 application topically 2 (two) times daily. (Patient taking differently: Apply 1  application  topically daily as needed (for rash/itching).) 30 g 0   No current facility-administered medications on file prior to visit.   Fhx: Family History  Problem Relation Age of Onset   Dementia Mother    Kidney disease Father    Hypertension Father    Diabetes Father    Heart disease Father    Lung cancer Maternal Aunt        dx after 47   Prostate cancer Other        MGM's brother   Breast cancer Neg Hx    Shx: -2 cigarrettes a day (started around age 74) -Denies EtOH and recreational drug use -lives with daughter in [place] -retired, worked at hospital supplies  -independent with ADLs, does not drive  Social History   Socioeconomic History   Marital status: Single    Spouse name: Not on file   Number of children: Not on file   Years of education: Not on file   Highest education level: Not on file  Occupational History   Occupation: retired  Tobacco Use   Smoking status: Every Day    Packs/day: 0.50    Years: 56.00    Additional pack years: 0.00    Total pack years: 28.00    Types: Cigarettes   Smokeless tobacco: Never  Vaping Use   Vaping Use: Never used  Substance and Sexual Activity   Alcohol use: No   Drug use: No   Sexual activity: Not Currently    Birth control/protection: Surgical    Comment: Hyst  Other Topics Concern   Not on file  Social History Narrative   Not on file   Social Determinants of Health   Financial Resource Strain: Medium Risk (04/08/2023)   Overall Financial Resource Strain (CARDIA)    Difficulty of Paying Living Expenses: Somewhat hard  Food Insecurity: No Food Insecurity (04/08/2023)   Hunger Vital Sign    Worried About Running Out of Food in the Last Year: Never true    Ran Out of Food in the Last Year: Never true  Transportation Needs: No Transportation Needs (04/08/2023)   PRAPARE - Administrator, Civil Service (Medical): No    Lack of Transportation (Non-Medical): No  Physical Activity: Insufficiently Active  (04/08/2023)   Exercise Vital Sign    Days of Exercise per Week: 3 days    Minutes of Exercise per Session: 30 min  Stress: No Stress Concern Present (04/08/2023)   [employer] of Occupational Health - Occupational Stress Questionnaire    Feeling of Stress : Not at all  Social Connections: Socially Isolated (04/08/2023)   Social Connection and Isolation Panel [NHANES]    Frequency of Communication with Friends and Family: Three times a week    Frequency of Social Gatherings with Friends and Family: Twice a week    Attends Religious Services: Never    Database administrator or Organizations: No    Attends Banker Meetings: Never  Marital Status: Never married  Intimate Partner Violence: Not At Risk (04/08/2023)   Humiliation, Afraid, Rape, and Kick questionnaire    Fear of Current or Ex-Partner: No    Emotionally Abused: No    Physically Abused: No    Sexually Abused: No   Review of Systems: ROS negative except for what is noted on the assessment and plan.  Vitals:   04/08/23 0356 04/08/23 1540 04/08/23 1558  BP: (!) 172/86 (!) 182/78 (!) 157/80  Pulse:  79 73  SpO2:  100%   Weight:  95 lb 4.8 oz (43.2 kg)   Height:  5' (1.524 m)    Physical Exam: Constitutional: alert, thin-appearing female sitting in chair comfortably, in no acute distress HENT: normocephalic atraumatic Neck: supple Cardiovascular: regular rate and rhythm, no LE edema Pulmonary/Chest: normal work of breathing on room air, lungs clear to auscultation bilaterally Abdominal: bowel sounds present, soft, non-tender, non-distended MSK: normal bulk and tone Neurological: alert & oriented x 3 Skin: warm and dry Psych: pleasant mood  Assessment & Plan:   Colon cancer screening Patient agreeable today for colon cancer screening. Referral to GI for colonoscopy sent.   Essential hypertension Vitals:   04/08/23 1540 04/08/23 1558  BP: (!) 182/78 (!) 157/80  BP remains elevated on repeat. Patient  states she has BP cuff at home and BP ranges 160s/80s. Asymptomatic and denies any chest pain, SHOB, headache, or blurry vision. She is taking carvedilol 6.25 mg BID (which she takes 1 tablet in AM and 1/2 tablet in PM due to reported headache) and spironolactone 25 mg daily. She was on amlodipine but stopped due to headaches, was on losartan but stopped due to preference. Noted angioedema with lisinopril. After extensive discussion on BP control given her other comorbidities, she would still prefer to not increase spironolactone or start another medication such as a thiazide. She states she prefers eating healthier and juicing. She is agreeable to taking the carvedilol as prescribed so 6.25 mg BID (2 tablets a day instead of 1.5 tablets). We agreed to discuss further at next visit if BP still not controlled.   Plan -continue spironolactone 25 mg daily -patient agreeable to taking carvedilol as prescribed 6.25 mg BID  -at f/u visit, reassess BP and continue conversation   Diabetes mellitus type 2 in nonobese (HCC) Last A1c 5.7% in 11/2022. Not on any medications at this time. Will continue to monitor.   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, right [facility]) S/p right breast mastectomy in 06/2022. She was following with oncology, last OV was in 07/2022 with planned 3 mth f/u. Has not had f/u since 07/2022 and she states she was unaware she needed further f/u. Advised patient to schedule f/u with her oncologist. Daughter will call oncology office to schedule appointment.   Hyperlipidemia Patient is prescribed atorvastatin 40 mg daily at prior cardiology visit. Was on Crestor but stopped due to reported side effects. She states she has not been taking the atorvastatin since "my numbers were good". Emphasized importance of taking her statin to continue managing her HLD. Patient is agreeable to taking her atorvastatin.   Plan -restart atorvastatin 40 mg daily  Patient discussed  with Dr. Marin Roberts, D.O. [facility] Internal Medicine, PGY-1 Phone: [phone] Date 04/08/2023 Time 9:12 PM

## 2023-04-08 NOTE — Assessment & Plan Note (Signed)
>>  ASSESSMENT AND PLAN FOR DIABETES MELLITUS TYPE 2 IN NONOBESE (HCC) WRITTEN ON 04/08/2023  9:08 PM BY ZHENG, MICHAEL, DO  Last A1c 5.7% in 11/2022. Not on any medications at this time. Will continue to monitor.

## 2023-04-08 NOTE — Assessment & Plan Note (Signed)
Vitals:   04/08/23 1540 04/08/23 1558  BP: (!) 182/78 (!) 157/80  BP remains elevated on repeat. Patient states she has BP cuff at home and BP ranges 160s/80s. Asymptomatic and denies any chest pain, SHOB, headache, or blurry vision. She is taking carvedilol 6.25 mg BID (which she takes 1 tablet in AM and 1/2 tablet in PM due to reported headache) and spironolactone 25 mg daily. She was on amlodipine but stopped due to headaches, was on losartan but stopped due to preference. Noted angioedema with lisinopril. After extensive discussion on BP control given her other comorbidities, she would still prefer to not increase spironolactone or start another medication such as a thiazide. She states she prefers eating healthier and juicing. She is agreeable to taking the carvedilol as prescribed so 6.25 mg BID (2 tablets a day instead of 1.5 tablets). We agreed to discuss further at next visit if BP still not controlled.   Plan -continue spironolactone 25 mg daily -patient agreeable to taking carvedilol as prescribed 6.25 mg BID  -at f/u visit, reassess BP and continue conversation

## 2023-04-08 NOTE — Patient Instructions (Signed)
Thank you, [name] for allowing [place] to provide your care today. Welcome to the Internal Medicine Center!  Blood Pressure -blood pressure is still high, take your spironolactone daily, we decided to take the carvedilol twice a day   -Please follow up and make appointment with your oncologist to see them soon.  -Referral to GI for colonoscopy for colon cancer screening.     I have ordered the following labs for you:  Lab Orders  No laboratory test(s) ordered today    I have ordered the following medication/changed the following medications:   Stop the following medications: There are no discontinued medications.   Start the following medications: No orders of the defined types were placed in this encounter.    Follow up:  1 month    Should you have any questions or concerns please call the internal medicine clinic at [phone].    Amy Howell, D.O. [facility]

## 2023-04-09 DIAGNOSIS — E785 Hyperlipidemia, unspecified: Secondary | ICD-10-CM | POA: Insufficient documentation

## 2023-04-09 NOTE — Progress Notes (Signed)
Internal Medicine Clinic Attending  Case discussed with Dr. Zheng  At the time of the visit.  We reviewed the resident's history and exam and pertinent patient test results.  I agree with the assessment, diagnosis, and plan of care documented in the resident's note.  

## 2023-04-09 NOTE — Assessment & Plan Note (Signed)
Patient is prescribed atorvastatin 40 mg daily at prior cardiology visit. Was on Crestor but stopped due to reported side effects. She states she has not been taking the atorvastatin since "my numbers were good". Emphasized importance of taking her statin to continue managing her HLD. Patient is agreeable to taking her atorvastatin.   Plan -restart atorvastatin 40 mg daily

## 2023-04-14 ENCOUNTER — Other Ambulatory Visit: Payer: Self-pay

## 2023-04-14 ENCOUNTER — Other Ambulatory Visit (HOSPITAL_COMMUNITY): Payer: Self-pay

## 2023-04-14 ENCOUNTER — Other Ambulatory Visit: Payer: Self-pay | Admitting: Student

## 2023-04-16 ENCOUNTER — Other Ambulatory Visit (HOSPITAL_BASED_OUTPATIENT_CLINIC_OR_DEPARTMENT_OTHER): Payer: Self-pay

## 2023-04-16 ENCOUNTER — Other Ambulatory Visit (HOSPITAL_COMMUNITY): Payer: Self-pay

## 2023-04-16 MED ORDER — SPIRONOLACTONE 25 MG PO TABS
25.0000 mg | ORAL_TABLET | Freq: Every day | ORAL | 2 refills | Status: DC
  Filled 2023-04-16: qty 30, 30d supply, fill #0
  Filled 2023-05-21: qty 30, 30d supply, fill #1
  Filled 2023-06-22: qty 30, 30d supply, fill #2

## 2023-04-18 NOTE — Progress Notes (Signed)
Internal Medicine Clinic Attending  Case and documentation of Dr. Zheng reviewed.  I reviewed the AWV findings.  I agree with the assessment, diagnosis, and plan of care documented in the AWV note.      

## 2023-04-29 ENCOUNTER — Telehealth: Payer: Self-pay | Admitting: *Deleted

## 2023-04-29 NOTE — Telephone Encounter (Signed)
Call from patient's daughter stated that patient has stopped taking the Plavix as it made her sluggish.  Has been off the Plavix for about 2 weeks.  Patient has restarted her Brilinta 90 mg.  States she feels better taking this medication.  Needs to schedule a return appointment for follow up appointment . Given an appointment for 05/08/2023.  Has enough Brilinta on hand.

## 2023-04-30 ENCOUNTER — Other Ambulatory Visit (HOSPITAL_COMMUNITY): Payer: Self-pay

## 2023-04-30 DIAGNOSIS — H401123 Primary open-angle glaucoma, left eye, severe stage: Secondary | ICD-10-CM | POA: Diagnosis not present

## 2023-04-30 DIAGNOSIS — H2513 Age-related nuclear cataract, bilateral: Secondary | ICD-10-CM | POA: Diagnosis not present

## 2023-04-30 DIAGNOSIS — I639 Cerebral infarction, unspecified: Secondary | ICD-10-CM | POA: Diagnosis not present

## 2023-04-30 DIAGNOSIS — H401111 Primary open-angle glaucoma, right eye, mild stage: Secondary | ICD-10-CM | POA: Diagnosis not present

## 2023-04-30 MED ORDER — DORZOLAMIDE HCL 2 % OP SOLN
1.0000 [drp] | Freq: Two times a day (BID) | OPHTHALMIC | 6 refills | Status: DC
  Filled 2023-04-30: qty 10, 50d supply, fill #0

## 2023-04-30 MED ORDER — TIMOLOL MALEATE 0.5 % OP SOLN
1.0000 [drp] | Freq: Every morning | OPHTHALMIC | 3 refills | Status: DC
  Filled 2023-04-30: qty 10, 100d supply, fill #0
  Filled 2023-10-01: qty 10, 100d supply, fill #1

## 2023-04-30 MED ORDER — LATANOPROST 0.005 % OP SOLN
1.0000 [drp] | Freq: Every day | OPHTHALMIC | 2 refills | Status: DC
  Filled 2023-04-30: qty 7.5, 75d supply, fill #0

## 2023-05-01 ENCOUNTER — Other Ambulatory Visit: Payer: Self-pay

## 2023-05-01 ENCOUNTER — Other Ambulatory Visit (HOSPITAL_COMMUNITY): Payer: Self-pay

## 2023-05-07 ENCOUNTER — Telehealth: Payer: Self-pay | Admitting: Interventional Cardiology

## 2023-05-07 NOTE — Telephone Encounter (Signed)
Spoke with pt's daughter, DPR who reports pt did not feel well taking Plavix so she has stopped the Plavix and restarted Brillinta 90mg  twice daily.  Pt's daughter states pt feels better taking Brillinta.  Pt continues to take ASA 81mg  daily. Advised Dr Eldridge Dace is not in the office today but will forward message to make him aware of medication transition.  Will contact with any further recommendations per Dr Eldridge Dace.  Pt's daughter verbalizes understanding and agrees with current plan.

## 2023-05-07 NOTE — Telephone Encounter (Signed)
Pt c/o medication issue:  1. Name of Medication:  Brilinta 90 MG tablet  2. How are you currently taking this medication (dosage and times per day)?   3. Are you having a reaction (difficulty breathing--STAT)?   4. What is your medication issue?   Patient's daughter states while admitted in the hospital she was switched from Brilinta to Clopidogrel 75. Daughter states it was causing her to feel sluggish so she stopped taking it and started back on Brilinta. She would like to check with Dr. Eldridge Dace to make sure he agrees with this. Please advise.

## 2023-05-07 NOTE — Telephone Encounter (Signed)
OK to use Brilinta 90 mg BID.  No documented prior brain hemorrhage.

## 2023-05-08 ENCOUNTER — Other Ambulatory Visit (HOSPITAL_COMMUNITY): Payer: Self-pay

## 2023-05-08 ENCOUNTER — Ambulatory Visit (INDEPENDENT_AMBULATORY_CARE_PROVIDER_SITE_OTHER): Payer: Medicare (Managed Care)

## 2023-05-08 VITALS — BP 192/82 | HR 59 | Temp 98.0°F | Ht 60.0 in | Wt 94.2 lb

## 2023-05-08 DIAGNOSIS — F17210 Nicotine dependence, cigarettes, uncomplicated: Secondary | ICD-10-CM

## 2023-05-08 DIAGNOSIS — I509 Heart failure, unspecified: Secondary | ICD-10-CM

## 2023-05-08 DIAGNOSIS — I5043 Acute on chronic combined systolic (congestive) and diastolic (congestive) heart failure: Secondary | ICD-10-CM | POA: Diagnosis not present

## 2023-05-08 DIAGNOSIS — E119 Type 2 diabetes mellitus without complications: Secondary | ICD-10-CM | POA: Diagnosis not present

## 2023-05-08 DIAGNOSIS — Z139 Encounter for screening, unspecified: Secondary | ICD-10-CM

## 2023-05-08 DIAGNOSIS — Z0000 Encounter for general adult medical examination without abnormal findings: Secondary | ICD-10-CM | POA: Insufficient documentation

## 2023-05-08 DIAGNOSIS — I110 Hypertensive heart disease with heart failure: Secondary | ICD-10-CM

## 2023-05-08 MED ORDER — TICAGRELOR 90 MG PO TABS
90.0000 mg | ORAL_TABLET | Freq: Two times a day (BID) | ORAL | 5 refills | Status: DC
  Filled 2023-05-08: qty 60, 30d supply, fill #0
  Filled 2023-07-01: qty 60, 30d supply, fill #1
  Filled 2023-09-09: qty 60, 30d supply, fill #2

## 2023-05-08 MED ORDER — HYDROCHLOROTHIAZIDE 12.5 MG PO TABS
12.5000 mg | ORAL_TABLET | Freq: Every day | ORAL | 1 refills | Status: DC
  Filled 2023-05-08: qty 30, 30d supply, fill #0
  Filled 2023-06-02: qty 30, 30d supply, fill #1

## 2023-05-08 NOTE — Assessment & Plan Note (Signed)
>>  ASSESSMENT AND PLAN FOR ACUTE ON CHRONIC COMBINED SYSTOLIC AND DIASTOLIC CHF (CONGESTIVE HEART FAILURE) (HCC) WRITTEN ON 05/08/2023  2:56 PM BY MAPP, Gaylyn Cheers, MD  Patient has a history of HFrEF. Echo from 01/2023 demonstrates EF 40-45 % w/ G2DD. Current medications include spironolactone 25 MG daily and carvedilol 6.25 MG BID. Patient states that they are compliant with these medications. Patient states that they do not check their BP regularly at home. Patient denies HA, lightheadedness, dizziness, CP, palpitations, SOB, or LE swelling. Initial BP today is 220/76. Repeat BP is 187/73. Second repeat BP is 192/82. Patient has history of angioedema with lisinopril, therefore, would not elect for ACEi or ARB for BP control or GDMT. Was previously on amlodipine but was stopped due to headaches, therefore, will not restart this medication. Eventually, I suspect patient would benefit from addition of SGLT2i given history of T2DM. However, given severe hypertension in clinic, will elect for hydrochlorothiazide for improved BP control. Considered increasing her carvedilol dose today, however, limited by heart rate of 59. If patient's heart rate is higher at future appointments, could consider stopping HCTZ and increasing carvedilol dose to maximize GDMT. If heart rate remains low at subsequent visits, could consider starting SGLT2i to maximize GDMT.   Plan: - Continue spironolactone 25 MG daily and carvedilol 6.25 MG BID - Start hydrochlorothiazide 12.5 mg daily - BMP today

## 2023-05-08 NOTE — Assessment & Plan Note (Signed)
Referred patient for colonoscopy

## 2023-05-08 NOTE — Assessment & Plan Note (Signed)
Currently diet controlled. Patient denies polyuria, polydipsia, fatigue. Patient states that they do visit the ophthalmologist for yearly eye exams. A1c was 5.7% 5 months ago. Foot exam performed today.   Plan: - Continue dietary modification - Urine ACR today - A1c today - F/u ophthalmology

## 2023-05-08 NOTE — Progress Notes (Deleted)
CC: f/u HTN  HPI:  [name] is a 67 y.o. female with past medical history of HTN, HLD, CAD, HFrEF, T2DM, malignant neoplasm of the right breast, tobacco use disorder that presents for f/u HTN.    Allergies as of 05/08/2023       Reactions   Lisinopril Other (See Comments)   Angioedema   Pork-derived Products    Rosuvastatin Other (See Comments)   headache        Medication List        Accurate as of May 08, 2023  2:22 PM. If you have any questions, ask your nurse or doctor.          aspirin 81 MG chewable tablet Chew 1 tablet (81 mg total) by mouth daily.   atorvastatin 40 MG tablet Commonly known as: LIPITOR Take 1 tablet (40 mg total) by mouth daily.   carvedilol 6.25 MG tablet Commonly known as: COREG Take 1 tablet (6.25 mg total) by mouth 2 (two) times daily.   clopidogrel 75 MG tablet Commonly known as: PLAVIX Take 1 tablet (75 mg total) by mouth daily with breakfast.   dorzolamide 2 % ophthalmic solution Commonly known as: TRUSOPT Instill 1 drop into both eyes twice a day   dorzolamide 2 % ophthalmic solution Commonly known as: TRUSOPT Place 1 drop into both eyes 2 (two) times daily.   feeding supplement Liqd Take 237 mLs by mouth daily.   GARLIQUE PO Patient takes 1 tablet by mouth daily.   latanoprost 0.005 % ophthalmic solution Commonly known as: XALATAN Instill 1 drop into both eyes at bedtime   latanoprost 0.005 % ophthalmic solution Commonly known as: XALATAN Place 1 drop into both eyes at bedtime.   spironolactone 25 MG tablet Commonly known as: ALDACTONE Take 1 tablet (25 mg total) by mouth daily.   timolol 0.5 % ophthalmic solution Commonly known as: TIMOPTIC Place 1 drop into both eyes in the morning.   timolol 0.5 % ophthalmic solution Commonly known as: TIMOPTIC Place 1 drop into both eyes every morning.   triamcinolone cream 0.1 % Commonly known as: KENALOG Apply 1 application topically 2 (two) times  daily. What changed:  when to take this reasons to take this         Past Medical History:  Diagnosis Date   Cataracts, bilateral    Cerebral infarction due to carotid artery stenosis (HCC)    Diabetes mellitus type 2 in nonobese (HCC)    Glaucoma    Hypertension    Hypertensive emergency 11/22/2022   Stroke [facility])    no residual, "series of mini strokes"   Tobacco abuse    Vertigo    Review of Systems:  per HPI.   Physical Exam: *** Vitals:   05/08/23 1353  BP: (!) 220/76  Pulse: 69  Temp: 98 F (36.7 C)  TempSrc: Oral  SpO2: 100%  Weight: 94 lb 3.2 oz (42.7 kg)  Height: 5' (1.524 m)   *** Constitutional: Well-developed, well-nourished, appears comfortable  HENT: Normocephalic and atraumatic.  Eyes: EOM are normal.  Neck: Normal range of motion.  Cardiovascular: Regular rate, regular rhythm. No murmurs, rubs, or gallops. Normal radial and PT pulses bilaterally. No LE edema.  Pulmonary: Normal respiratory effort. No wheezes, rales, or rhonchi.   Abdominal: Soft. Non-distended. No tenderness. Normal bowel sounds.  Musculoskeletal: Normal gait.     Neurological: Alert and oriented to person, place, and time. Non-focal. Skin: warm and dry.    Assessment &  Plan:   HFrEF/HTN Patient has a history of HFrEF. Echo from 01/2023 demonstrates EF 40-45 % w/ G2DD. Current medications include spironolactone 25 MG daily and carvedilol 6.25 MG BID. Patient states that they are compliant with these medications. Patient states that they do not check their BP regularly at home. Patient denies HA, lightheadedness, dizziness, CP, palpitations, SOB, or LE swelling. Initial BP today is 220/76. Repeat BP is 187/73. Patient has history of angioedema with lisinopril. Was previously on amlodipine but was stopped due to headaches.   Plan: - Continue spironolactone 25 MG daily and carvedilol 6.25 MG BID*** - Start hydrochlorothiazide 12.5 mg daily?*** - BMP today***   2. T2DM Patient  has a history of T2DM. Currently diet controlled. Patient denies polyuria, polydipsia, fatigue. Patient states that they do visit the ophthalmologist for yearly eye exams. A1c was 5.7% 5 months ago. A1c today ***%. Foot exam performed today***.   Plan: - Continue dietary modification*** - Urine ACR today*** - F/u ophthalmology      5. Health Screening: ***tdap (not today), pneumonia vaccine (not today), Hep C screening (not today), colonoscopy (referrall**),  -  - Medication refill? ***  -   See Encounters Tab for problem based charting.  No problem-specific Assessment & Plan notes found for this encounter.    Patient seen with Dr. Amadeo Garnet

## 2023-05-08 NOTE — Telephone Encounter (Signed)
Patient and her daughter are following up requesting advisement.

## 2023-05-08 NOTE — Progress Notes (Signed)
CC: f/u HTN  HPI:  [name] is a 67 y.o. female with past medical history of HTN, HLD, CAD, HFrEF, T2DM, malignant neoplasm of the right breast, tobacco use disorder that presents for f/u HTN.    Allergies as of 05/08/2023       Reactions   Lisinopril Other (See Comments)   Angioedema   Pork-derived Products    Rosuvastatin Other (See Comments)   headache        Medication List        Accurate as of May 08, 2023  2:57 PM. If you have any questions, ask your nurse or doctor.          aspirin 81 MG chewable tablet Chew 1 tablet (81 mg total) by mouth daily.   atorvastatin 40 MG tablet Commonly known as: LIPITOR Take 1 tablet (40 mg total) by mouth daily.   carvedilol 6.25 MG tablet Commonly known as: COREG Take 1 tablet (6.25 mg total) by mouth 2 (two) times daily.   clopidogrel 75 MG tablet Commonly known as: PLAVIX Take 1 tablet (75 mg total) by mouth daily with breakfast.   dorzolamide 2 % ophthalmic solution Commonly known as: TRUSOPT Instill 1 drop into both eyes twice a day   dorzolamide 2 % ophthalmic solution Commonly known as: TRUSOPT Place 1 drop into both eyes 2 (two) times daily.   feeding supplement Liqd Take 237 mLs by mouth daily.   GARLIQUE PO Patient takes 1 tablet by mouth daily.   hydrochlorothiazide 12.5 MG tablet Commonly known as: HYDRODIURIL Take 1 tablet (12.5 mg total) by mouth daily. Started by: Karoline Caldwell, MD   latanoprost 0.005 % ophthalmic solution Commonly known as: XALATAN Instill 1 drop into both eyes at bedtime   latanoprost 0.005 % ophthalmic solution Commonly known as: XALATAN Place 1 drop into both eyes at bedtime.   spironolactone 25 MG tablet Commonly known as: ALDACTONE Take 1 tablet (25 mg total) by mouth daily.   timolol 0.5 % ophthalmic solution Commonly known as: TIMOPTIC Place 1 drop into both eyes in the morning.   timolol 0.5 % ophthalmic solution Commonly known as: TIMOPTIC Place 1  drop into both eyes every morning.   triamcinolone cream 0.1 % Commonly known as: KENALOG Apply 1 application topically 2 (two) times daily. What changed:  when to take this reasons to take this         Past Medical History:  Diagnosis Date   Cataracts, bilateral    Cerebral infarction due to carotid artery stenosis (HCC)    Diabetes mellitus type 2 in nonobese (HCC)    Glaucoma    Hypertension    Hypertensive emergency 11/22/2022   Stroke [facility])    no residual, "series of mini strokes"   Tobacco abuse    Vertigo    Review of Systems:  per HPI.   Physical Exam: Vitals:   05/08/23 0200 05/08/23 1353 05/08/23 1427 05/08/23 1428  BP: (!) 187/73 (!) 220/76 (!) 218/75 (!) 192/82  Pulse: 64 69 63 (!) 59  Temp:  98 F (36.7 C)    TempSrc:  Oral    SpO2:  100%    Weight:  94 lb 3.2 oz (42.7 kg)    Height:  5' (1.524 m)     Constitutional: Well-developed, well-nourished, appears comfortable  HENT: Normocephalic and atraumatic.  Eyes: EOM are normal.  Neck: Normal range of motion.  Cardiovascular: Regular rate, regular rhythm. No murmurs, rubs, or gallops. Normal radial and  PT pulses bilaterally. No LE edema.  Pulmonary: Normal respiratory effort. No wheezes, rales, or rhonchi.   Abdominal: Soft. Non-distended. No tenderness. Normal bowel sounds.  Musculoskeletal: Normal gait.     Neurological: Alert and oriented to person, place, and time. Non-focal. Skin: warm and dry.    Assessment & Plan:   See Encounters Tab for problem based charting.  Acute on chronic combined systolic and diastolic CHF (congestive heart failure) (HCC) Patient has a history of HFrEF. Echo from 01/2023 demonstrates EF 40-45 % w/ G2DD. Current medications include spironolactone 25 MG daily and carvedilol 6.25 MG BID. Patient states that they are compliant with these medications. Patient states that they do not check their BP regularly at home. Patient denies HA, lightheadedness, dizziness, CP,  palpitations, SOB, or LE swelling. Initial BP today is 220/76. Repeat BP is 187/73. Second repeat BP is 192/82. Patient has history of angioedema with lisinopril, therefore, would not elect for ACEi or ARB for BP control or GDMT. Was previously on amlodipine but was stopped due to headaches, therefore, will not restart this medication. Eventually, I suspect patient would benefit from addition of SGLT2i given history of T2DM. However, given severe hypertension in clinic, will elect for hydrochlorothiazide for improved BP control. Considered increasing her carvedilol dose today, however, limited by heart rate of 59. If patient's heart rate is higher at future appointments, could consider stopping HCTZ and increasing carvedilol dose to maximize GDMT. If heart rate remains low at subsequent visits, could consider starting SGLT2i to maximize GDMT.   Plan: - Continue spironolactone 25 MG daily and carvedilol 6.25 MG BID - Start hydrochlorothiazide 12.5 mg daily - BMP today  Diabetes mellitus type 2 in nonobese (HCC) Currently diet controlled. Patient denies polyuria, polydipsia, fatigue. Patient states that they do visit the ophthalmologist for yearly eye exams. A1c was 5.7% 5 months ago. Foot exam performed today.   Plan: - Continue dietary modification - Urine ACR today - A1c today - F/u ophthalmology   Healthcare maintenance Referred patient for colonoscopy.     Patient seen with Dr. Lafonda Mosses.

## 2023-05-08 NOTE — Patient Instructions (Addendum)
Thank you for coming to see [place] in clinic Amy Howell.  Plan:  - Please start taking:     - Hydrochlorothiazide 12.5 mg daily for high blood pressure  - Please continue taking:     - Spironolactone 25 MG daily    - Carvedilol 6.25 MG twice daily  - We got the following labs today and will call you with the results:     - BMP to check your electrolytes and kidney function   - A1c to check your blood glucose   - Urine albumin/creatinine ratio to check your kidney function  - We referred you to see the following providers (you will be called to schedule an appointment):     - Gastroenterologist (stomach doctor) for a colonoscopy   It was very nice to see you, thank you for allowing [place] to be involved in your care. We look forward to seeing you next time. Please call our clinic at [phone] if you have any questions or concerns. The best time to call is Monday-Friday from 9am-4pm, but there is someone available 24/7. If after hours or the weekend, call the main hospital number at [phone] and ask for the Internal Medicine Resident On-Call. If you need medication refills, please notify your pharmacy one week in advance and they will send [place] a request.   Please make sure to arrive 15 minutes prior to your next appointment. If you arrive late, you may be asked to reschedule.

## 2023-05-08 NOTE — Assessment & Plan Note (Signed)
>>  ASSESSMENT AND PLAN FOR DIABETES MELLITUS TYPE 2 IN NONOBESE (HCC) WRITTEN ON 05/08/2023  2:56 PM BY MAPP, GLADIES, MD  Currently diet controlled. Patient denies polyuria, polydipsia, fatigue. Patient states that they do visit the ophthalmologist for yearly eye exams. A1c was 5.7% 5 months ago. Foot exam performed today.   Plan: - Continue dietary modification - Urine ACR today - A1c today - F/u ophthalmology

## 2023-05-08 NOTE — Assessment & Plan Note (Signed)
Patient has a history of HFrEF. Echo from 01/2023 demonstrates EF 40-45 % w/ G2DD. Current medications include spironolactone 25 MG daily and carvedilol 6.25 MG BID. Patient states that they are compliant with these medications. Patient states that they do not check their BP regularly at home. Patient denies HA, lightheadedness, dizziness, CP, palpitations, SOB, or LE swelling. Initial BP today is 220/76. Repeat BP is 187/73. Second repeat BP is 192/82. Patient has history of angioedema with lisinopril, therefore, would not elect for ACEi or ARB for BP control or GDMT. Was previously on amlodipine but was stopped due to headaches, therefore, will not restart this medication. Eventually, I suspect patient would benefit from addition of SGLT2i given history of T2DM. However, given severe hypertension in clinic, will elect for hydrochlorothiazide for improved BP control. Considered increasing her carvedilol dose today, however, limited by heart rate of 59. If patient's heart rate is higher at future appointments, could consider stopping HCTZ and increasing carvedilol dose to maximize GDMT. If heart rate remains low at subsequent visits, could consider starting SGLT2i to maximize GDMT.   Plan: - Continue spironolactone 25 MG daily and carvedilol 6.25 MG BID - Start hydrochlorothiazide 12.5 mg daily - BMP today

## 2023-05-08 NOTE — Telephone Encounter (Signed)
Called the pt and daughter back and informed them that per Dr. Eldridge Dace, ok to use Brilinta 90 mg bid vs plavix.  Per Dr. Eldridge Dace, no documented prior brain hemorrhage in pts history.  Daughter states the pt will need a refill of her Brilinta 90 mg bid sent to the pts confirmed pharmacy of choice.  Daughter is aware that I will send that refill in right now.   Discontinued the pts plavix from her med list and sent in new script for Brilinta 90 mg po BID to her pharmacy of choice.  Daughter verbalized understanding and agrees with this plan.

## 2023-05-09 LAB — MICROALBUMIN / CREATININE URINE RATIO
Creatinine, Urine: 99.4 mg/dL
Microalb/Creat Ratio: 23 mg/g creat (ref 0–29)
Microalbumin, Urine: 22.9 ug/mL

## 2023-05-10 LAB — BMP8+ANION GAP
Anion Gap: 17 mmol/L (ref 10.0–18.0)
BUN/Creatinine Ratio: 25 (ref 12–28)
BUN: 29 mg/dL — ABNORMAL HIGH (ref 8–27)
CO2: 20 mmol/L (ref 20–29)
Calcium: 9.8 mg/dL (ref 8.7–10.3)
Chloride: 102 mmol/L (ref 96–106)
Creatinine, Ser: 1.14 mg/dL — ABNORMAL HIGH (ref 0.57–1.00)
Glucose: 74 mg/dL (ref 70–99)
Potassium: 4.9 mmol/L (ref 3.5–5.2)
Sodium: 139 mmol/L (ref 134–144)
eGFR: 52 mL/min/{1.73_m2} — ABNORMAL LOW (ref >59)

## 2023-05-10 LAB — HEMOGLOBIN A1C
Est. average glucose Bld gHb Est-mCnc: 126 mg/dL
Hgb A1c MFr Bld: 6 % — ABNORMAL HIGH (ref 4.8–5.6)

## 2023-05-12 NOTE — Progress Notes (Signed)
Internal Medicine Clinic Attending  Case discussed with Dr. Mapp  At the time of the visit.  We reviewed the resident's history and exam and pertinent patient test results.  I agree with the assessment, diagnosis, and plan of care documented in the resident's note.  

## 2023-05-14 NOTE — Progress Notes (Signed)
Attempted to call several times to update on results without answer. Left voicemail to call back the clinic to discuss further steps. Will continue to attempt to contact the patient regarding her results.

## 2023-05-23 ENCOUNTER — Encounter: Payer: Self-pay | Admitting: Physician Assistant

## 2023-05-23 ENCOUNTER — Other Ambulatory Visit (HOSPITAL_COMMUNITY): Payer: Self-pay

## 2023-06-03 ENCOUNTER — Other Ambulatory Visit (HOSPITAL_COMMUNITY): Payer: Self-pay

## 2023-06-16 ENCOUNTER — Ambulatory Visit: Payer: Medicare (Managed Care)

## 2023-06-30 ENCOUNTER — Ambulatory Visit: Payer: Medicare (Managed Care) | Attending: Interventional Cardiology

## 2023-06-30 DIAGNOSIS — E785 Hyperlipidemia, unspecified: Secondary | ICD-10-CM

## 2023-07-01 ENCOUNTER — Other Ambulatory Visit (HOSPITAL_COMMUNITY): Payer: Self-pay

## 2023-07-01 ENCOUNTER — Telehealth: Payer: Self-pay | Admitting: *Deleted

## 2023-07-01 ENCOUNTER — Other Ambulatory Visit: Payer: Self-pay

## 2023-07-01 LAB — HEPATIC FUNCTION PANEL

## 2023-07-01 LAB — SPECIMEN STATUS REPORT

## 2023-07-01 MED ORDER — HYDROCHLOROTHIAZIDE 12.5 MG PO TABS
12.5000 mg | ORAL_TABLET | Freq: Every day | ORAL | 1 refills | Status: DC
  Filled 2023-07-01: qty 30, 30d supply, fill #0
  Filled 2023-07-30: qty 30, 30d supply, fill #1

## 2023-07-01 NOTE — Telephone Encounter (Signed)
Left message to call office

## 2023-07-01 NOTE — Telephone Encounter (Signed)
-----   Message from Bethany sent at 07/01/2023 11:19 AM EDT ----- LDL increased.  Is she taking atorvastatin?

## 2023-07-07 ENCOUNTER — Other Ambulatory Visit (HOSPITAL_COMMUNITY): Payer: Self-pay

## 2023-07-07 NOTE — Telephone Encounter (Signed)
Results reviewed with patient's daughter.  She thinks patient has been taking but may not have been taking daily.  She will start watching more carefully to make sure patient takes medication.  She will call us with an update.

## 2023-07-15 NOTE — Telephone Encounter (Signed)
Call placed to follow up.  Left message to call office

## 2023-07-21 NOTE — Telephone Encounter (Signed)
Left message to call office

## 2023-07-22 ENCOUNTER — Other Ambulatory Visit: Payer: Self-pay | Admitting: Student

## 2023-07-23 ENCOUNTER — Other Ambulatory Visit (HOSPITAL_COMMUNITY): Payer: Self-pay

## 2023-07-23 MED ORDER — SPIRONOLACTONE 25 MG PO TABS
25.0000 mg | ORAL_TABLET | Freq: Every day | ORAL | 11 refills | Status: DC
  Filled 2023-07-23: qty 30, 30d supply, fill #0
  Filled 2023-08-18: qty 30, 30d supply, fill #1

## 2023-07-24 ENCOUNTER — Other Ambulatory Visit (HOSPITAL_COMMUNITY): Payer: Self-pay

## 2023-08-04 NOTE — Progress Notes (Deleted)
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  [name] was seen initially in December 2023 when she presented to the ED with complaint of orthopnea and productive cough.  Troponins were completed mildly upward with BNP of 1542.  2D echo was completed showing reduced EF of 30-35% with global hypokinesis and grade 1 DD.  She underwent R/LHC that showed 75% stenosed mid RCA lesion treated with PCI/DES x 1, moderately elevated diastolic pressure and moderate LV systolic function.  She was discharged with GDMT and BP medications ordered.  Patient was seen in follow-up doing well and BP medication was increased.  She was seen in advanced heart failure clinic and reported discontinuing her losartan due to headache.  BP was elevated and patient was taking carvedilol incorrectly.  She had spironolactone and carvedilol increased at that time.  She is not a candidate for Entresto due to angioedema and BP remain elevated with carvedilol increased to 6.25 mg in a.m. and 3.125 mg in p.m. She was seen by Dr. Eldridge Dace on 03/17/2023 and reported walking regularly with no complaints of chest pain or dyspnea on exertion.  2D echo  have been completed showing slight improvement of EF at 40-45%.  She continued to have elevated BP which she described as lower at home and higher in the doctor's office.  She is not in favor of adding additional BP medications was advised to continue to monitor at this time.  During today's visit the patient reports they are ***.  Patient denies chest pain, palpitations, dyspnea, PND, orthopnea, nausea, vomiting, dizziness, syncope, edema, weight gain, or early satiety.  ***Notes: -Check to see if she is taking her medications -especially the Atorvastatin  Review of Systems  Please see the history of present illness.    All other systems reviewed and are otherwise negative except as noted above.  Physical Exam    Wt Readings from Last 3 Encounters:  05/08/23 94 lb 3.2 oz (42.7 kg)  04/08/23 95 lb 4.8 oz (43.2 kg)  04/08/23 95 lb 4.8 oz (43.2 kg)   UJ:WJXBJ were no vitals filed for this visit.,There is no height or weight on file to calculate BMI. GEN: Well nourished, well developed in no acute distress Neck: No JVD; No carotid bruits Pulmonary: Clear to auscultation without rales, wheezing or rhonchi  Cardiovascular: Normal rate. Regular rhythm. Normal S1. Normal S2.   Murmurs: There is no murmur.  ABDOMEN: Soft, non-tender, non-distended EXTREMITIES:  No edema; No deformity   EKG/LABS/ Recent Cardiac Studies   ECG personally reviewed by me today - ***  Risk Assessment/Calculations:   {Does this patient  have ATRIAL FIBRILLATION?:[phone]}      Lab Results  Component Value Date   WBC 5.6 11/26/2022   HGB 11.2 (L) 11/26/2022   HGB 11.6 (L) 11/26/2022   HCT 33.0 (L) 11/26/2022   HCT 34.0 (L) 11/26/2022   MCV 95.2 11/26/2022   PLT 136 (L) 11/26/2022   Lab Results  Component Value Date   CREATININE 1.14 (H) 05/08/2023   BUN 29 (H) 05/08/2023   NA 139 05/08/2023   K 4.9 05/08/2023   CL 102 05/08/2023   CO2 20 05/08/2023   Lab Results  Component Value Date   CHOL  176 06/30/2023   HDL 50 06/30/2023   LDLCALC 110 (H) 06/30/2023   TRIG 87 06/30/2023   CHOLHDL 3.5 06/30/2023    Lab Results  Component Value Date   HGBA1C 6.0 (H) 05/08/2023   Assessment & Plan    1.  Coronary artery disease: -s/p PCI to RCA in 11/2022 -Today patient reports*** -Continue GDMT with***  2.  Essential hypertension: -Patient has compliance issues with BP management and blood pressure today was*** -Continue***  3.  HFrEF: -2D echo completed 12/203  showing reduced EF of 30-35% and GDMT started however compliance entered progression and repeat 2D completed 01/2023 showing slightly increased EF of 40-45% -Today patient is*** -Continue current GDMT with***  4.  Hyperlipidemia: -Patient's LDL cholesterol was*** -Continue***  5.  Tobacco abuse: -Patient is currently***      Disposition: Follow-up with Lance Muss, MD or APP in *** months {Are you ordering a CV Procedure (e.g. stress test, cath, DCCV, TEE, etc)?   Press F2        :[id number]}   Signed, Napoleon Form, Leodis Rains, NP 08/04/2023, 12:41 PM [facility]

## 2023-08-05 ENCOUNTER — Ambulatory Visit: Payer: Medicare (Managed Care) | Admitting: Physician Assistant

## 2023-08-05 ENCOUNTER — Ambulatory Visit: Payer: Medicare (Managed Care) | Admitting: Nurse Practitioner

## 2023-08-05 ENCOUNTER — Other Ambulatory Visit (HOSPITAL_COMMUNITY): Payer: Self-pay

## 2023-08-05 ENCOUNTER — Encounter: Payer: Self-pay | Admitting: Physician Assistant

## 2023-08-05 VITALS — BP 140/80 | HR 88 | Ht 60.0 in | Wt 95.0 lb

## 2023-08-05 DIAGNOSIS — Z7901 Long term (current) use of anticoagulants: Secondary | ICD-10-CM | POA: Diagnosis not present

## 2023-08-05 DIAGNOSIS — I5043 Acute on chronic combined systolic (congestive) and diastolic (congestive) heart failure: Secondary | ICD-10-CM | POA: Diagnosis not present

## 2023-08-05 DIAGNOSIS — F17200 Nicotine dependence, unspecified, uncomplicated: Secondary | ICD-10-CM

## 2023-08-05 DIAGNOSIS — E119 Type 2 diabetes mellitus without complications: Secondary | ICD-10-CM

## 2023-08-05 DIAGNOSIS — I2510 Atherosclerotic heart disease of native coronary artery without angina pectoris: Secondary | ICD-10-CM

## 2023-08-05 DIAGNOSIS — Z86010 Personal history of colonic polyps: Secondary | ICD-10-CM

## 2023-08-05 DIAGNOSIS — I10 Essential (primary) hypertension: Secondary | ICD-10-CM

## 2023-08-05 DIAGNOSIS — E785 Hyperlipidemia, unspecified: Secondary | ICD-10-CM

## 2023-08-05 MED ORDER — NA SULFATE-K SULFATE-MG SULF 17.5-3.13-1.6 GM/177ML PO SOLN
1.0000 | Freq: Once | ORAL | 0 refills | Status: AC
  Filled 2023-08-05: qty 354, 1d supply, fill #0

## 2023-08-05 NOTE — Telephone Encounter (Signed)
Patient missed appointment today with Robin Searing, NP

## 2023-08-05 NOTE — Patient Instructions (Signed)
You have been scheduled for a colonoscopy. Please follow written instructions given to you at your visit today.   Please pick up your prep supplies at the pharmacy within the next 1-3 days.  If you use inhalers (even only as needed), please bring them with you on the day of your procedure.  DO NOT TAKE 7 DAYS PRIOR TO TEST- Trulicity (dulaglutide) Ozempic, Wegovy (semaglutide) Mounjaro (tirzepatide) Bydureon Bcise (exanatide extended release)  DO NOT TAKE 1 DAY PRIOR TO YOUR TEST Rybelsus (semaglutide) Adlyxin (lixisenatide) Victoza (liraglutide) Byetta (exanatide) ___________________________________________________________________________  _______________________________________________________  If your blood pressure at your visit was 140/90 or greater, please contact your primary care physician to follow up on this.  _______________________________________________________  If you are age 81 or older, your body mass index should be between 23-30. Your Body mass index is 18.55 kg/m. If this is out of the aforementioned range listed, please consider follow up with your Primary Care Provider.  If you are age 76 or younger, your body mass index should be between 19-25. Your Body mass index is 18.55 kg/m. If this is out of the aformentioned range listed, please consider follow up with your Primary Care Provider.   ________________________________________________________  The [facility] providers would like to encourage you to use [facility] to communicate with providers for non-urgent requests or questions.  Due to long hold times on the telephone, sending your provider a message by [facility] may be a faster and more efficient way to get a response.  Please allow 48 business hours for a response.  Please remember that this is for non-urgent requests.  _______________________________________________________

## 2023-08-05 NOTE — Progress Notes (Signed)
Chief Complaint: Discuss colonoscopy  HPI:    Amy Howell is a 67 year old African-American female with a past medical history as listed below including breast cancer status post right mastectomy, diabetes and previous cerebral infarction due to CAD on Brilinta (02/03/2023 echo with LVEF 40-45%), who was referred to me by Rana Snare, DO for question of a colonoscopy.    05/08/2023 BMP with a BUN of 29, creatinine 1.14 (around baseline over the past 7 months)    06/30/2023 hepatic function panel normal.    Today, patient presents to clinic accompanied by her daughter, they are actually both due for colonoscopy and they want to be scheduled the same day.  As far as her GI history it is always been covered in [place] until she moved here about 10 years ago.  Describes 2 previous colonoscopies with polyps but uncertain how many or where.  She tells me she has the records at home.  Denies any acute GI complaints or concerns.      She is on Brilinta given her history of stroke and ischemic cardiomyopathy as well as CAD.    Denies fever, chills, weight loss or change in bowel habits.  Past Medical History:  Diagnosis Date   Cataracts, bilateral    Cerebral infarction due to carotid artery stenosis (HCC)    Diabetes mellitus type 2 in nonobese (HCC)    Glaucoma    Hypertension    Hypertensive emergency 11/22/2022   Stroke [facility])    no residual, "series of mini strokes"   Tobacco abuse    Vertigo     Past Surgical History:  Procedure Laterality Date   ABDOMINAL HYSTERECTOMY     CARDIAC DEFIBRILLATOR PLACEMENT     CORONARY STENT INTERVENTION N/A 11/26/2022   Procedure: CORONARY STENT INTERVENTION;  Surgeon: Corky Crafts, MD;  Location: [facility] INV[facility];  Service: Cardiovascular;  Laterality: N/A;   gum operation     Implantable loop recorder removal  02/01/2019   MDT Reveal LINQ explantation in office by JA   IR 3D INDEPENDENT WKST  05/07/2017   IR ANGIO INTRA EXTRACRAN SEL COM  CAROTID INNOMINATE BILAT MOD SED  04/23/2017   IR ANGIO INTRA EXTRACRAN SEL COM CAROTID INNOMINATE BILAT MOD SED  08/06/2017   IR ANGIO INTRA EXTRACRAN SEL COM CAROTID INNOMINATE UNI R MOD SED  05/07/2017   IR ANGIO VERTEBRAL SEL VERTEBRAL BILAT MOD SED  04/23/2017   IR ANGIO VERTEBRAL SEL VERTEBRAL BILAT MOD SED  08/06/2017   IR ANGIOGRAM FOLLOW UP STUDY  05/07/2017   IR ANGIOGRAM SELECTIVE EACH ADDITIONAL VESSEL  05/07/2017   IR RADIOLOGIST EVAL & MGMT  04/16/2017   IR RADIOLOGIST EVAL & MGMT  05/29/2017   IR TRANSCATH/EMBOLIZ  05/07/2017   LOOP RECORDER INSERTION N/A 03/28/2017   Procedure: Loop Recorder Insertion;  Surgeon: Hillis Range, MD;  Location: [facility];  Service: Cardiovascular;  Laterality: N/A;   MASTECTOMY W/ SENTINEL NODE BIOPSY Right 06/25/2022   Procedure: RIGHT MASTECTOMY WITH AXILLARY SENTINEL LYMPH NODE BIOPSY;  Surgeon: Manus Rudd, MD;  Location: [facility];  Service: General;  Laterality: Right;   RADIOLOGY WITH ANESTHESIA N/A 05/07/2017   Procedure: EMBOLIZATION;  Surgeon: Julieanne Cotton, MD;  Location: [facility] OR;  Service: Radiology;  Laterality: N/A;   RADIOLOGY WITH ANESTHESIA N/A 08/06/2017   Procedure: RADIOLOGY WITH ANESTHESIA STENTING;  Surgeon: Julieanne Cotton, MD;  Location: [facility] OR;  Service: Radiology;  Laterality: N/A;   RIGHT/LEFT HEART CATH AND  CORONARY ANGIOGRAPHY N/A 11/26/2022   Procedure: RIGHT/LEFT HEART CATH AND CORONARY ANGIOGRAPHY;  Surgeon: Corky Crafts, MD;  Location: [facility] INVASIVE CV LAB;  Service: Cardiovascular;  Laterality: N/A;   TEE WITHOUT CARDIOVERSION N/A 03/28/2017   Procedure: TRANSESOPHAGEAL ECHOCARDIOGRAM (TEE);  Surgeon: Lewayne Bunting, MD;  Location: [facility] ENDOSCOPY;  Service: Cardiovascular;  Laterality: N/A;    Current Outpatient Medications  Medication Sig Dispense Refill   aspirin 81 MG chewable tablet Chew 1 tablet (81 mg total) by mouth daily. 30 tablet 3   atorvastatin (LIPITOR) 40 MG tablet Take 1 tablet  (40 mg total) by mouth daily. 90 tablet 3   carvedilol (COREG) 6.25 MG tablet Take 1 tablet (6.25 mg total) by mouth 2 (two) times daily. 180 tablet 3   dorzolamide (TRUSOPT) 2 % ophthalmic solution Instill 1 drop into both eyes twice a day 30 mL 3   dorzolamide (TRUSOPT) 2 % ophthalmic solution Place 1 drop into both eyes 2 (two) times daily. 10 mL 6   feeding supplement, ENSURE ENLIVE, (ENSURE ENLIVE) LIQD Take 237 mLs by mouth daily.     Garlic (GARLIQUE PO) Patient takes 1 tablet by mouth daily.     hydrochlorothiazide (HYDRODIURIL) 12.5 MG tablet Take 1 tablet (12.5 mg total) by mouth daily. 30 tablet 1   latanoprost (XALATAN) 0.005 % ophthalmic solution Instill 1 drop into both eyes at bedtime 22.5 mL 3   latanoprost (XALATAN) 0.005 % ophthalmic solution Place 1 drop into both eyes at bedtime. 7.5 mL 2   spironolactone (ALDACTONE) 25 MG tablet Take 1 tablet (25 mg total) by mouth daily. 30 tablet 11   ticagrelor (BRILINTA) 90 MG TABS tablet Take 1 tablet (90 mg total) by mouth 2 (two) times daily. 60 tablet 5   timolol (TIMOPTIC) 0.5 % ophthalmic solution Place 1 drop into both eyes in the morning. 45 mL 3   timolol (TIMOPTIC) 0.5 % ophthalmic solution Place 1 drop into both eyes every morning. 45 mL 3   triamcinolone cream (KENALOG) 0.1 % Apply 1 application topically 2 (two) times daily. (Patient taking differently: Apply 1 application  topically daily as needed (for rash/itching).) 30 g 0   No current facility-administered medications for this visit.    Allergies as of 08/05/2023 - Review Complete 05/08/2023  Allergen Reaction Noted   Lisinopril Other (See Comments) 03/23/2020   Pork-derived products  11/26/2022   Rosuvastatin Other (See Comments) 03/17/2023    Family History  Problem Relation Age of Onset   Dementia Mother    Kidney disease Father    Hypertension Father    Diabetes Father    Heart disease Father    Lung cancer Maternal Aunt        dx after 54   Prostate  cancer Other        MGM's brother   Breast cancer Neg Hx     Social History   Socioeconomic History   Marital status: Single    Spouse name: Not on file   Number of children: Not on file   Years of education: Not on file   Highest education level: Not on file  Occupational History   Occupation: retired  Tobacco Use   Smoking status: Every Day    Current packs/day: 0.50    Average packs/day: 0.5 packs/day for 56.0 years (28.0 ttl pk-yrs)    Types: Cigarettes   Smokeless tobacco: Never  Vaping Use   Vaping status: Never Used  Substance and Sexual Activity  Alcohol use: No   Drug use: No   Sexual activity: Not Currently    Birth control/protection: Surgical    Comment: Hyst  Other Topics Concern   Not on file  Social History Narrative   Not on file   Social Determinants of Health   Financial Resource Strain: Medium Risk (04/08/2023)   Overall Financial Resource Strain (CARDIA)    Difficulty of Paying Living Expenses: Somewhat hard  Food Insecurity: No Food Insecurity (04/08/2023)   Hunger Vital Sign    Worried About Running Out of Food in the Last Year: Never true    Ran Out of Food in the Last Year: Never true  Transportation Needs: No Transportation Needs (04/08/2023)   PRAPARE - Administrator, Civil Service (Medical): No    Lack of Transportation (Non-Medical): No  Physical Activity: Insufficiently Active (04/08/2023)   Exercise Vital Sign    Days of Exercise per Week: 3 days    Minutes of Exercise per Session: 30 min  Stress: No Stress Concern Present (04/08/2023)   [employer] of Occupational Health - Occupational Stress Questionnaire    Feeling of Stress : Not at all  Social Connections: Socially Isolated (04/08/2023)   Social Connection and Isolation Panel [NHANES]    Frequency of Communication with Friends and Family: Three times a week    Frequency of Social Gatherings with Friends and Family: Twice a week    Attends Religious Services: Never     Database administrator or Organizations: No    Attends Banker Meetings: Never    Marital Status: Never married  Intimate Partner Violence: Not At Risk (04/08/2023)   Humiliation, Afraid, Rape, and Kick questionnaire    Fear of Current or Ex-Partner: No    Emotionally Abused: No    Physically Abused: No    Sexually Abused: No    Review of Systems:    Constitutional: No weight loss, fever or chills Cardiovascular: No chest pain, chest pressure or palpitations   Respiratory: No SOB Gastrointestinal: See HPI and otherwise negative Genitourinary: No dysuria Neurological: No headache, dizziness or syncope Musculoskeletal: No new muscle or joint pain Hematologic: No bleeding  Psychiatric: No history of depression or anxiety   Physical Exam:  Vital signs: BP (!) 140/80   Pulse 88   Ht 5' (1.524 m)   Wt 95 lb (43.1 kg)   BMI 18.55 kg/m   Constitutional:   Pleasant AA female appears to be in NAD, Well developed, Well nourished, alert and cooperative Head:  Normocephalic and atraumatic. Eyes:   PEERL, EOMI. No icterus. Conjunctiva pink. Ears:  Normal auditory acuity. Neck:  Supple Throat: Oral cavity and pharynx without inflammation, swelling or lesion.  Respiratory: Respirations even and unlabored. Lungs clear to auscultation bilaterally.   No wheezes, crackles, or rhonchi.  Cardiovascular: Normal S1, S2. No MRG. Regular rate and rhythm. No peripheral edema, cyanosis or pallor.  Gastrointestinal:  Soft, nondistended, nontender. No rebound or guarding. Normal bowel sounds. No appreciable masses or hepatomegaly. Rectal:  Not performed.  Msk:  Symmetrical without gross deformities. Without edema, no deformity or joint abnormality.  Neurologic:  Alert and  oriented x4;  grossly normal neurologically.  Skin:   Dry and intact without significant lesions or rashes. Psychiatric: Demonstrates good judgement and reason without abnormal affect or behaviors.  RELEVANT LABS AND  IMAGING: CBC    Component Value Date/Time   WBC 5.6 11/26/2022 0051   RBC 3.51 (L) 11/26/2022 8657  HGB 11.2 (L) 11/26/2022 0946   HGB 11.6 (L) 11/26/2022 0946   HGB 12.1 06/12/2022 0807   HGB 12.4 06/28/2021 1525   HCT 33.0 (L) 11/26/2022 0946   HCT 34.0 (L) 11/26/2022 0946   HCT 37.8 06/28/2021 1525   PLT 136 (L) 11/26/2022 0051   PLT 169 06/12/2022 0807   PLT 201 06/28/2021 1525   MCV 95.2 11/26/2022 0051   MCV 91 06/28/2021 1525   MCH 29.9 11/26/2022 0051   MCHC 31.4 11/26/2022 0051   RDW 13.3 11/26/2022 0051   RDW 12.5 06/28/2021 1525   LYMPHSABS 1.1 11/26/2022 0051   LYMPHSABS 1.7 06/28/2021 1525   MONOABS 0.6 11/26/2022 0051   EOSABS 0.3 11/26/2022 0051   EOSABS 0.2 06/28/2021 1525   BASOSABS 0.0 11/26/2022 0051   BASOSABS 0.0 06/28/2021 1525    CMP     Component Value Date/Time   NA 139 05/08/2023 1438   K 4.9 05/08/2023 1438   CL 102 05/08/2023 1438   CO2 20 05/08/2023 1438   GLUCOSE 74 05/08/2023 1438   GLUCOSE 72 01/08/2023 1457   BUN 29 (H) 05/08/2023 1438   CREATININE 1.14 (H) 05/08/2023 1438   CREATININE 1.21 (H) 06/12/2022 0807   CALCIUM 9.8 05/08/2023 1438   PROT 7.3 06/30/2023 0805   ALBUMIN 4.1 06/30/2023 0805   AST 17 06/30/2023 0805   AST 17 06/12/2022 0807   ALT 12 06/30/2023 0805   ALT 15 06/12/2022 0807   ALKPHOS 103 06/30/2023 0805   BILITOT 0.2 06/30/2023 0805   BILITOT 0.4 06/12/2022 0807   GFRNONAA 44 (L) 01/08/2023 1457   GFRNONAA 49 (L) 06/12/2022 0807   GFRAA 68 07/15/2019 1103    Assessment: 1.  History of colon polyps: Patient reports 2 previous colonoscopies in [place] (is going to bring records with her), apparently the last one was at least 10 years ago; 2 for surveillance 2.  Chronic anticoagulation: On Brilinta for history of stroke and CAD status post stenting in December 2023 3.  Ischemic cardiomyopathy: Recent EF 40-45% in March  Plan: 1.  Asked patient to bring her previous GI records with her to appointment so  they can get scanned into her chart. 2.  Scheduled patient for a surveillance colonoscopy given history of polyps in the [facility] with Dr. Barron Alvine.  This was scheduled on the same day as her daughter's appointment.  Did provide the patient a detailed list of risks for the procedure and she agrees to proceed. 3.  Patient was advised to hold her Brilinta for 5 days prior to time of procedure.  We will communicate with her prescribing physician to ensure this is acceptable for her. 4.  Patient to follow in clinic per recommendations after time of procedure.  Hyacinth Meeker, PA-C [facility] 08/05/2023, 1:49 PM  Cc: Rana Snare, DO

## 2023-08-08 ENCOUNTER — Telehealth: Payer: Self-pay | Admitting: *Deleted

## 2023-08-08 NOTE — Telephone Encounter (Signed)
1st attempt to reach pt in regards to appointment for clearance. Pt is currently scheduled to see Robin Searing, NP on 10/24/23. Patients surgery is scheduled for 10/03/23. If patient returns call, the plan is too see if patient wants to move current appointment sooner

## 2023-08-08 NOTE — Telephone Encounter (Signed)
   Name: Amy Howell  DOB: 08-23-1956  MRN: [id number]  Primary Cardiologist: Lance Muss, MD   Preoperative team, please contact this patient and set up a phone call appointment for further preoperative risk assessment. Please obtain consent and complete medication review. Thank you for your help.Last seen by Dr. Eldridge Dace in April of 2024.  I confirm that guidance regarding antiplatelet and oral anticoagulation therapy has been completed and, if necessary, noted below.  They are requesting pharmacy clearance, but he is on Brilinta and ASA only, and not on anticoagulation therapy.   Joni Reining, NP 08/08/2023, 12:15 PM [facility]

## 2023-08-08 NOTE — Telephone Encounter (Signed)
[facility] Pre-operative Risk Assessment     Request for surgical clearance:     Endoscopy Procedure  What type of surgery is being performed?     colonoscopy  When is this surgery scheduled?     10/03/23  What type of clearance is required ?   Pharmacy  Are there any medications that need to be held prior to surgery and how long? Brilinta 5 days  Practice name and name of physician performing surgery?      [facility]  What is your office phone and fax number?      Phone- [phone]  Fax- [phone]  Anesthesia type (None, local, MAC, general) ?       MAC

## 2023-08-11 NOTE — Telephone Encounter (Signed)
2nd attempt at calling pt to reschedule earlier in-office visit or tele visit. [email or web address]

## 2023-08-12 NOTE — Telephone Encounter (Signed)
Patient is now scheduled to see Robin Searing, NP on 11/22

## 2023-08-12 NOTE — Telephone Encounter (Signed)
Spoke with patient daughter Debbe Odea (per DPR), patient rescheduled to see Robin Searing, NP on 09/15/23 for clearance

## 2023-08-14 ENCOUNTER — Other Ambulatory Visit (HOSPITAL_COMMUNITY): Payer: Self-pay

## 2023-08-18 ENCOUNTER — Other Ambulatory Visit (HOSPITAL_COMMUNITY): Payer: Self-pay

## 2023-08-18 ENCOUNTER — Telehealth: Payer: Self-pay | Admitting: Physician Assistant

## 2023-08-18 MED ORDER — NA SULFATE-K SULFATE-MG SULF 17.5-3.13-1.6 GM/177ML PO SOLN
1.0000 | Freq: Once | ORAL | 0 refills | Status: AC
  Filled 2023-08-18: qty 354, 2d supply, fill #0

## 2023-08-18 NOTE — Telephone Encounter (Signed)
Inbound call from patient's daughter requesting a call back to ensure prep medication has been sent for patient. Please advise, thank you.

## 2023-08-18 NOTE — Telephone Encounter (Signed)
Resent script to [facility].

## 2023-08-20 ENCOUNTER — Other Ambulatory Visit (HOSPITAL_COMMUNITY): Payer: Self-pay

## 2023-08-20 NOTE — Progress Notes (Signed)
Agree with the assessment and plan as outlined by Jennifer Lemmon, PA-C. ? ?Jaja Switalski, DO, FACG ? ?

## 2023-08-26 ENCOUNTER — Other Ambulatory Visit: Payer: Self-pay | Admitting: Student

## 2023-08-28 ENCOUNTER — Other Ambulatory Visit (HOSPITAL_COMMUNITY): Payer: Self-pay

## 2023-08-28 MED ORDER — HYDROCHLOROTHIAZIDE 12.5 MG PO TABS
12.5000 mg | ORAL_TABLET | Freq: Every day | ORAL | 2 refills | Status: DC
  Filled 2023-08-28: qty 30, 30d supply, fill #0

## 2023-09-01 ENCOUNTER — Other Ambulatory Visit (HOSPITAL_COMMUNITY): Payer: Self-pay

## 2023-09-04 ENCOUNTER — Telehealth: Payer: Self-pay | Admitting: Student

## 2023-09-04 NOTE — Telephone Encounter (Signed)
Return call to pt's daughter, Debbe Odea.  She stated pt went out to eat on Saturday and the diarrhea started on Sunday. She stated pt is remaining hydrated. I recommended pt to try OTC Lomotil since pt has not tried anything else.  Stated she will have pt to  try Lomotil and if it does not help, she will call back to schedule an appt. Sending to the doctor for approval of Lomotil and/or other recommendations. Thanks

## 2023-09-04 NOTE — Telephone Encounter (Signed)
Pt having diarrhea since Saturday or Sunday per the patient's daughter.  Please call back and speak with the patient's Latisha.

## 2023-09-12 ENCOUNTER — Other Ambulatory Visit: Payer: Self-pay

## 2023-09-12 ENCOUNTER — Emergency Department (HOSPITAL_COMMUNITY): Payer: Medicare (Managed Care)

## 2023-09-12 ENCOUNTER — Inpatient Hospital Stay (HOSPITAL_COMMUNITY)
Admission: EM | Admit: 2023-09-12 | Discharge: 2023-09-18 | DRG: 291 | Disposition: A | Discharge status: Home / Self Care | Payer: Medicare (Managed Care) | Attending: Internal Medicine | Admitting: Internal Medicine

## 2023-09-12 ENCOUNTER — Encounter (HOSPITAL_COMMUNITY): Payer: Self-pay | Admitting: Emergency Medicine

## 2023-09-12 DIAGNOSIS — E785 Hyperlipidemia, unspecified: Secondary | ICD-10-CM | POA: Diagnosis present

## 2023-09-12 DIAGNOSIS — I6521 Occlusion and stenosis of right carotid artery: Secondary | ICD-10-CM | POA: Diagnosis not present

## 2023-09-12 DIAGNOSIS — I2510 Atherosclerotic heart disease of native coronary artery without angina pectoris: Secondary | ICD-10-CM | POA: Diagnosis present

## 2023-09-12 DIAGNOSIS — Z91014 Allergy to mammalian meats: Secondary | ICD-10-CM

## 2023-09-12 DIAGNOSIS — E119 Type 2 diabetes mellitus without complications: Secondary | ICD-10-CM | POA: Diagnosis present

## 2023-09-12 DIAGNOSIS — Y832 Surgical operation with anastomosis, bypass or graft as the cause of abnormal reaction of the patient, or of later complication, without mention of misadventure at the time of the procedure: Secondary | ICD-10-CM | POA: Diagnosis present

## 2023-09-12 DIAGNOSIS — I161 Hypertensive emergency: Secondary | ICD-10-CM | POA: Diagnosis not present

## 2023-09-12 DIAGNOSIS — I5022 Chronic systolic (congestive) heart failure: Secondary | ICD-10-CM

## 2023-09-12 DIAGNOSIS — R0602 Shortness of breath: Secondary | ICD-10-CM | POA: Diagnosis not present

## 2023-09-12 DIAGNOSIS — J90 Pleural effusion, not elsewhere classified: Secondary | ICD-10-CM | POA: Diagnosis not present

## 2023-09-12 DIAGNOSIS — N179 Acute kidney failure, unspecified: Secondary | ICD-10-CM | POA: Diagnosis not present

## 2023-09-12 DIAGNOSIS — R0989 Other specified symptoms and signs involving the circulatory and respiratory systems: Secondary | ICD-10-CM | POA: Diagnosis not present

## 2023-09-12 DIAGNOSIS — K5792 Diverticulitis of intestine, part unspecified, without perforation or abscess without bleeding: Secondary | ICD-10-CM | POA: Diagnosis not present

## 2023-09-12 DIAGNOSIS — R7989 Other specified abnormal findings of blood chemistry: Secondary | ICD-10-CM | POA: Insufficient documentation

## 2023-09-12 DIAGNOSIS — Z841 Family history of disorders of kidney and ureter: Secondary | ICD-10-CM

## 2023-09-12 DIAGNOSIS — Z681 Body mass index (BMI) 19 or less, adult: Secondary | ICD-10-CM

## 2023-09-12 DIAGNOSIS — Z853 Personal history of malignant neoplasm of breast: Secondary | ICD-10-CM

## 2023-09-12 DIAGNOSIS — T82856A Stenosis of peripheral vascular stent, initial encounter: Secondary | ICD-10-CM | POA: Diagnosis not present

## 2023-09-12 DIAGNOSIS — K5732 Diverticulitis of large intestine without perforation or abscess without bleeding: Secondary | ICD-10-CM | POA: Diagnosis not present

## 2023-09-12 DIAGNOSIS — Z9011 Acquired absence of right breast and nipple: Secondary | ICD-10-CM | POA: Diagnosis not present

## 2023-09-12 DIAGNOSIS — Z79899 Other long term (current) drug therapy: Secondary | ICD-10-CM | POA: Diagnosis not present

## 2023-09-12 DIAGNOSIS — I5023 Acute on chronic systolic (congestive) heart failure: Secondary | ICD-10-CM | POA: Diagnosis not present

## 2023-09-12 DIAGNOSIS — I509 Heart failure, unspecified: Secondary | ICD-10-CM

## 2023-09-12 DIAGNOSIS — Z888 Allergy status to other drugs, medicaments and biological substances status: Secondary | ICD-10-CM | POA: Diagnosis not present

## 2023-09-12 DIAGNOSIS — I3139 Other pericardial effusion (noninflammatory): Secondary | ICD-10-CM | POA: Diagnosis not present

## 2023-09-12 DIAGNOSIS — Z955 Presence of coronary angioplasty implant and graft: Secondary | ICD-10-CM

## 2023-09-12 DIAGNOSIS — Z95810 Presence of automatic (implantable) cardiac defibrillator: Secondary | ICD-10-CM

## 2023-09-12 DIAGNOSIS — Z8673 Personal history of transient ischemic attack (TIA), and cerebral infarction without residual deficits: Secondary | ICD-10-CM | POA: Diagnosis not present

## 2023-09-12 DIAGNOSIS — I671 Cerebral aneurysm, nonruptured: Secondary | ICD-10-CM | POA: Diagnosis present

## 2023-09-12 DIAGNOSIS — I252 Old myocardial infarction: Secondary | ICD-10-CM

## 2023-09-12 DIAGNOSIS — T465X5A Adverse effect of other antihypertensive drugs, initial encounter: Secondary | ICD-10-CM | POA: Diagnosis not present

## 2023-09-12 DIAGNOSIS — R911 Solitary pulmonary nodule: Secondary | ICD-10-CM

## 2023-09-12 DIAGNOSIS — Z801 Family history of malignant neoplasm of trachea, bronchus and lung: Secondary | ICD-10-CM

## 2023-09-12 DIAGNOSIS — J811 Chronic pulmonary edema: Secondary | ICD-10-CM | POA: Diagnosis not present

## 2023-09-12 DIAGNOSIS — R918 Other nonspecific abnormal finding of lung field: Secondary | ICD-10-CM | POA: Diagnosis not present

## 2023-09-12 DIAGNOSIS — I6782 Cerebral ischemia: Secondary | ICD-10-CM | POA: Diagnosis not present

## 2023-09-12 DIAGNOSIS — I110 Hypertensive heart disease with heart failure: Secondary | ICD-10-CM | POA: Diagnosis not present

## 2023-09-12 DIAGNOSIS — I639 Cerebral infarction, unspecified: Secondary | ICD-10-CM | POA: Diagnosis not present

## 2023-09-12 DIAGNOSIS — I10 Essential (primary) hypertension: Secondary | ICD-10-CM | POA: Diagnosis present

## 2023-09-12 DIAGNOSIS — R159 Full incontinence of feces: Secondary | ICD-10-CM | POA: Diagnosis present

## 2023-09-12 DIAGNOSIS — E872 Acidosis, unspecified: Secondary | ICD-10-CM | POA: Diagnosis not present

## 2023-09-12 DIAGNOSIS — Z90710 Acquired absence of both cervix and uterus: Secondary | ICD-10-CM

## 2023-09-12 DIAGNOSIS — H409 Unspecified glaucoma: Secondary | ICD-10-CM | POA: Diagnosis present

## 2023-09-12 DIAGNOSIS — I952 Hypotension due to drugs: Secondary | ICD-10-CM | POA: Diagnosis not present

## 2023-09-12 DIAGNOSIS — J189 Pneumonia, unspecified organism: Secondary | ICD-10-CM

## 2023-09-12 DIAGNOSIS — R109 Unspecified abdominal pain: Secondary | ICD-10-CM | POA: Diagnosis not present

## 2023-09-12 DIAGNOSIS — R636 Underweight: Secondary | ICD-10-CM | POA: Insufficient documentation

## 2023-09-12 DIAGNOSIS — E43 Unspecified severe protein-calorie malnutrition: Secondary | ICD-10-CM | POA: Insufficient documentation

## 2023-09-12 DIAGNOSIS — Z5986 Financial insecurity: Secondary | ICD-10-CM

## 2023-09-12 DIAGNOSIS — I6523 Occlusion and stenosis of bilateral carotid arteries: Secondary | ICD-10-CM | POA: Diagnosis not present

## 2023-09-12 DIAGNOSIS — N1831 Chronic kidney disease, stage 3a: Secondary | ICD-10-CM | POA: Insufficient documentation

## 2023-09-12 DIAGNOSIS — R4182 Altered mental status, unspecified: Secondary | ICD-10-CM | POA: Diagnosis not present

## 2023-09-12 DIAGNOSIS — Z7982 Long term (current) use of aspirin: Secondary | ICD-10-CM

## 2023-09-12 DIAGNOSIS — R197 Diarrhea, unspecified: Secondary | ICD-10-CM | POA: Insufficient documentation

## 2023-09-12 DIAGNOSIS — E875 Hyperkalemia: Secondary | ICD-10-CM | POA: Diagnosis not present

## 2023-09-12 DIAGNOSIS — Z8249 Family history of ischemic heart disease and other diseases of the circulatory system: Secondary | ICD-10-CM

## 2023-09-12 DIAGNOSIS — Z833 Family history of diabetes mellitus: Secondary | ICD-10-CM

## 2023-09-12 DIAGNOSIS — F17210 Nicotine dependence, cigarettes, uncomplicated: Secondary | ICD-10-CM | POA: Diagnosis not present

## 2023-09-12 DIAGNOSIS — R64 Cachexia: Secondary | ICD-10-CM | POA: Diagnosis not present

## 2023-09-12 DIAGNOSIS — R29708 NIHSS score 8: Secondary | ICD-10-CM | POA: Diagnosis not present

## 2023-09-12 DIAGNOSIS — I420 Dilated cardiomyopathy: Secondary | ICD-10-CM | POA: Diagnosis not present

## 2023-09-12 DIAGNOSIS — K529 Noninfective gastroenteritis and colitis, unspecified: Secondary | ICD-10-CM | POA: Diagnosis present

## 2023-09-12 DIAGNOSIS — I63511 Cerebral infarction due to unspecified occlusion or stenosis of right middle cerebral artery: Secondary | ICD-10-CM | POA: Diagnosis not present

## 2023-09-12 DIAGNOSIS — I255 Ischemic cardiomyopathy: Secondary | ICD-10-CM | POA: Diagnosis not present

## 2023-09-12 DIAGNOSIS — R29818 Other symptoms and signs involving the nervous system: Secondary | ICD-10-CM | POA: Diagnosis not present

## 2023-09-12 DIAGNOSIS — Z7902 Long term (current) use of antithrombotics/antiplatelets: Secondary | ICD-10-CM

## 2023-09-12 DIAGNOSIS — I428 Other cardiomyopathies: Secondary | ICD-10-CM | POA: Diagnosis present

## 2023-09-12 DIAGNOSIS — K5730 Diverticulosis of large intestine without perforation or abscess without bleeding: Secondary | ICD-10-CM | POA: Diagnosis not present

## 2023-09-12 DIAGNOSIS — Z7984 Long term (current) use of oral hypoglycemic drugs: Secondary | ICD-10-CM

## 2023-09-12 DIAGNOSIS — I160 Hypertensive urgency: Secondary | ICD-10-CM | POA: Diagnosis not present

## 2023-09-12 DIAGNOSIS — I672 Cerebral atherosclerosis: Secondary | ICD-10-CM | POA: Diagnosis not present

## 2023-09-12 DIAGNOSIS — Z5320 Procedure and treatment not carried out because of patient's decision for unspecified reasons: Secondary | ICD-10-CM | POA: Diagnosis present

## 2023-09-12 DIAGNOSIS — I651 Occlusion and stenosis of basilar artery: Secondary | ICD-10-CM | POA: Diagnosis not present

## 2023-09-12 DIAGNOSIS — R29810 Facial weakness: Secondary | ICD-10-CM | POA: Diagnosis not present

## 2023-09-12 HISTORY — DX: Solitary pulmonary nodule: R91.1

## 2023-09-12 LAB — CBC WITH DIFFERENTIAL/PLATELET
Abs Immature Granulocytes: 0.03 10*3/uL (ref 0.00–0.07)
Basophils Absolute: 0 10*3/uL (ref 0.0–0.1)
Basophils Relative: 1 %
Eosinophils Absolute: 0.1 10*3/uL (ref 0.0–0.5)
Eosinophils Relative: 1 %
HCT: 42.6 % (ref 36.0–46.0)
Hemoglobin: 12.8 g/dL (ref 12.0–15.0)
Immature Granulocytes: 1 %
Lymphocytes Relative: 16 %
Lymphs Abs: 1 10*3/uL (ref 0.7–4.0)
MCH: 29.4 pg (ref 26.0–34.0)
MCHC: 30 g/dL (ref 30.0–36.0)
MCV: 97.9 fL (ref 80.0–100.0)
Monocytes Absolute: 0.5 10*3/uL (ref 0.1–1.0)
Monocytes Relative: 7 %
Neutro Abs: 4.6 10*3/uL (ref 1.7–7.7)
Neutrophils Relative %: 74 %
Platelets: 195 10*3/uL (ref 150–400)
RBC: 4.35 MIL/uL (ref 3.87–5.11)
RDW: 14.6 % (ref 11.5–15.5)
WBC: 6.2 10*3/uL (ref 4.0–10.5)
nRBC: 0 % (ref 0.0–0.2)

## 2023-09-12 LAB — BRAIN NATRIURETIC PEPTIDE: B Natriuretic Peptide: 2632.8 pg/mL — ABNORMAL HIGH (ref 0.0–100.0)

## 2023-09-12 LAB — URINALYSIS, ROUTINE W REFLEX MICROSCOPIC
Bilirubin Urine: NEGATIVE
Glucose, UA: NEGATIVE mg/dL
Hgb urine dipstick: NEGATIVE
Ketones, ur: 5 mg/dL — AB
Leukocytes,Ua: NEGATIVE
Nitrite: NEGATIVE
Protein, ur: 100 mg/dL — AB
Specific Gravity, Urine: 1.023 (ref 1.005–1.030)
pH: 5 (ref 5.0–8.0)

## 2023-09-12 LAB — COMPREHENSIVE METABOLIC PANEL
ALT: 67 U/L — ABNORMAL HIGH (ref 0–44)
AST: 55 U/L — ABNORMAL HIGH (ref 15–41)
Albumin: 3.3 g/dL — ABNORMAL LOW (ref 3.5–5.0)
Alkaline Phosphatase: 132 U/L — ABNORMAL HIGH (ref 38–126)
Anion gap: 13 (ref 5–15)
BUN: 22 mg/dL (ref 8–23)
CO2: 18 mmol/L — ABNORMAL LOW (ref 22–32)
Calcium: 9.1 mg/dL (ref 8.9–10.3)
Chloride: 106 mmol/L (ref 98–111)
Creatinine, Ser: 1.22 mg/dL — ABNORMAL HIGH (ref 0.44–1.00)
GFR, Estimated: 48 mL/min — ABNORMAL LOW (ref >60)
Glucose, Bld: 111 mg/dL — ABNORMAL HIGH (ref 70–99)
Potassium: 4.3 mmol/L (ref 3.5–5.1)
Sodium: 137 mmol/L (ref 135–145)
Total Bilirubin: 0.9 mg/dL (ref 0.3–1.2)
Total Protein: 6.9 g/dL (ref 6.5–8.1)

## 2023-09-12 LAB — I-STAT VENOUS BLOOD GAS, ED
Acid-base deficit: 7 mmol/L — ABNORMAL HIGH (ref 0.0–2.0)
Bicarbonate: 17.9 mmol/L — ABNORMAL LOW (ref 20.0–28.0)
Calcium, Ion: 1.09 mmol/L — ABNORMAL LOW (ref 1.15–1.40)
HCT: 40 % (ref 36.0–46.0)
Hemoglobin: 13.6 g/dL (ref 12.0–15.0)
O2 Saturation: 89 %
Potassium: 3.9 mmol/L (ref 3.5–5.1)
Sodium: 138 mmol/L (ref 135–145)
TCO2: 19 mmol/L — ABNORMAL LOW (ref 22–32)
pCO2, Ven: 33.3 mm[Hg] — ABNORMAL LOW (ref 44–60)
pH, Ven: 7.338 (ref 7.25–7.43)
pO2, Ven: 59 mm[Hg] — ABNORMAL HIGH (ref 32–45)

## 2023-09-12 LAB — TROPONIN I (HIGH SENSITIVITY)
Troponin I (High Sensitivity): 7 ng/L (ref <18)
Troponin I (High Sensitivity): 14 ng/L (ref <18)

## 2023-09-12 LAB — LIPASE, BLOOD: Lipase: 28 U/L (ref 11–51)

## 2023-09-12 MED ORDER — NITROGLYCERIN IN D5W 200-5 MCG/ML-% IV SOLN
0.0000 ug/min | INTRAVENOUS | Status: DC
  Administered 2023-09-12: 10 ug/min via INTRAVENOUS
  Administered 2023-09-13: 40 ug/min via INTRAVENOUS
  Filled 2023-09-12 (×2): qty 250

## 2023-09-12 MED ORDER — ATORVASTATIN CALCIUM 40 MG PO TABS
40.0000 mg | ORAL_TABLET | Freq: Every day | ORAL | Status: DC
  Administered 2023-09-13 – 2023-09-15 (×3): 40 mg via ORAL
  Filled 2023-09-12 (×3): qty 1

## 2023-09-12 MED ORDER — IOHEXOL 350 MG/ML SOLN
75.0000 mL | Freq: Once | INTRAVENOUS | Status: AC | PRN
  Administered 2023-09-12: 75 mL via INTRAVENOUS

## 2023-09-12 MED ORDER — HEPARIN SODIUM (PORCINE) 5000 UNIT/ML IJ SOLN
5000.0000 [IU] | Freq: Three times a day (TID) | INTRAMUSCULAR | Status: DC
  Filled 2023-09-12: qty 1

## 2023-09-12 MED ORDER — SODIUM CHLORIDE 0.9 % IV SOLN: 1.0000 g | Freq: Once | INTRAVENOUS | Status: DC

## 2023-09-12 MED ORDER — ENOXAPARIN SODIUM 300 MG/3ML IJ SOLN: 20.0000 mg | INTRAMUSCULAR | Status: DC

## 2023-09-12 MED ORDER — ENSURE ENLIVE PO LIQD
237.0000 mL | ORAL | Status: DC
  Administered 2023-09-13 – 2023-09-14 (×2): 237 mL via ORAL
  Filled 2023-09-12 (×2): qty 237

## 2023-09-12 MED ORDER — SODIUM CHLORIDE 0.9 % IV SOLN: 500.0000 mg | Freq: Once | INTRAVENOUS | Status: DC

## 2023-09-12 MED ORDER — TICAGRELOR 90 MG PO TABS
90.0000 mg | ORAL_TABLET | Freq: Two times a day (BID) | ORAL | Status: DC
  Filled 2023-09-12: qty 1

## 2023-09-12 MED ORDER — ASPIRIN 81 MG PO CHEW
81.0000 mg | CHEWABLE_TABLET | Freq: Every day | ORAL | Status: DC
  Administered 2023-09-13 – 2023-09-18 (×5): 81 mg via ORAL
  Filled 2023-09-12 (×5): qty 1

## 2023-09-12 MED ORDER — ENOXAPARIN SODIUM 30 MG/0.3ML IJ SOSY: 30.0000 mg | PREFILLED_SYRINGE | INTRAMUSCULAR | Status: DC

## 2023-09-12 MED ORDER — FUROSEMIDE 10 MG/ML IJ SOLN
40.0000 mg | Freq: Two times a day (BID) | INTRAMUSCULAR | Status: DC
  Administered 2023-09-12 – 2023-09-13 (×2): 40 mg via INTRAVENOUS
  Filled 2023-09-12 (×2): qty 4

## 2023-09-12 MED ORDER — ASPIRIN 325 MG PO TBEC: 325.0000 mg | DELAYED_RELEASE_TABLET | Freq: Every day | ORAL | Status: DC

## 2023-09-12 MED ORDER — METRONIDAZOLE 500 MG/100ML IV SOLN: 500.0000 mg | Freq: Once | INTRAVENOUS | Status: DC

## 2023-09-12 NOTE — H&P (Incomplete)
Date: 09/12/2023               Patient Name:  Amy Howell MRN: [id number]  DOB: [birth year] Age / Sex: 67 y.o., female   PCP: Rana Snare, DO         Medical Service: Internal Medicine Teaching Service         Attending Physician: Dr. Lonell Grandchild, MD      First Contact: Dr. Monna Fam, MD Pager [phone]    Second Contact: Dr. Marrianne Mood, MD Pager [phone]         After Hours (After 5p/  First Contact Pager: [phone]  weekends / holidays): Second Contact Pager: [phone]   SUBJECTIVE   Chief Complaint: shortness of breath, diarrhea  History of Present Illness:  Patient is a 67 year old woman with past medical history significant for HFrEF, HTN, HLD, CAD, malignant neoplasm of right breast s/p mastectomy, presenting with shortness of breath and diarrhea. Patient reports for the past month having increasing shortness of breath with exertion. She states she can no longer sleep flat, now requiring up to 8 pillows to sleep. She becomes short of breath while conversing and while on the toilet. She has also had increased clear sputum production over the last several weeks. Her daughter brought her to the ED today while seeing her out of breath and sweating while on the toilet. Patient denies chest pain, fever, nausea, vomiting, abdominal pain.   Also in the last several weeks, patient reports many episodes of diarrhea per day. She feels these episodes began last month after eating some bad shrimp. She has up to ten episodes of diarrhea per day, to the point where she has started wearing incontinence diapers. She describes her stool as clear or yellow mucus with very small formed stools. Denies hematochezia or melena.   ED Course: Patient was HDS, alert and oriented x4, satting 100% ORA WBC 6.2, Hgb 12.8, Cr 1.22, BNP 2,632  CTAP shows possible mild uncomplicated sigmoid diverticulitis, bilateral pleural effusions and ground glass opacities  CT PE negative for PE, right  pulmonary nodule   Past Medical History: Past Medical History:  Diagnosis Date  . Cataracts, bilateral   . Cerebral infarction due to carotid artery stenosis (HCC)   . Diabetes mellitus type 2 in nonobese (HCC)   . Glaucoma   . Hypertension   . Hypertensive emergency 11/22/2022  . Stroke [facility])    no residual, "series of mini strokes"  . Tobacco abuse   . Vertigo     Meds:  No current facility-administered medications on file prior to encounter.   Current Outpatient Medications on File Prior to Encounter  Medication Sig Dispense Refill  . aspirin EC 325 MG tablet Take 325 mg by mouth daily.    Marland Kitchen atorvastatin (LIPITOR) 40 MG tablet Take 1 tablet (40 mg total) by mouth daily. 90 tablet 3  . carvedilol (COREG) 6.25 MG tablet Take 1 tablet (6.25 mg total) by mouth 2 (two) times daily. 180 tablet 3  . dorzolamide (TRUSOPT) 2 % ophthalmic solution Instill 1 drop into both eyes twice a day 30 mL 3  . feeding supplement, ENSURE ENLIVE, (ENSURE ENLIVE) LIQD Take 237 mLs by mouth daily.    . hydrochlorothiazide (HYDRODIURIL) 12.5 MG tablet Take 1 tablet (12.5 mg total) by mouth daily. 30 tablet 2  . latanoprost (XALATAN) 0.005 % ophthalmic solution Instill 1 drop into both eyes at bedtime 22.5 mL 3  . spironolactone (ALDACTONE) 25 MG tablet Take  1 tablet (25 mg total) by mouth daily. 30 tablet 11  . ticagrelor (BRILINTA) 90 MG TABS tablet Take 1 tablet (90 mg total) by mouth 2 (two) times daily. (Patient taking differently: Take 0.5 tablets by mouth 2 (two) times daily.) 60 tablet 5  . timolol (TIMOPTIC) 0.5 % ophthalmic solution Place 1 drop into both eyes every morning. 45 mL 3  . aspirin 81 MG chewable tablet Chew 1 tablet (81 mg total) by mouth daily. (Patient not taking: Reported on 09/12/2023) 30 tablet 3     Past Surgical History:  Procedure Laterality Date  . ABDOMINAL HYSTERECTOMY    . CARDIAC DEFIBRILLATOR PLACEMENT    . CORONARY STENT INTERVENTION N/A 11/26/2022   Procedure:  CORONARY STENT INTERVENTION;  Surgeon: Corky Crafts, MD;  Location: [facility] INVASIVE CV LAB;  Service: Cardiovascular;  Laterality: N/A;  . gum operation    . Implantable loop recorder removal  02/01/2019   MDT Reveal LINQ explantation in office by JA  . IR 3D INDEPENDENT WKST  05/07/2017  . IR ANGIO INTRA EXTRACRAN SEL COM CAROTID INNOMINATE BILAT MOD SED  04/23/2017  . IR ANGIO INTRA EXTRACRAN SEL COM CAROTID INNOMINATE BILAT MOD SED  08/06/2017  . IR ANGIO INTRA EXTRACRAN SEL COM CAROTID INNOMINATE UNI R MOD SED  05/07/2017  . IR ANGIO VERTEBRAL SEL VERTEBRAL BILAT MOD SED  04/23/2017  . IR ANGIO VERTEBRAL SEL VERTEBRAL BILAT MOD SED  08/06/2017  . IR ANGIOGRAM FOLLOW UP STUDY  05/07/2017  . IR ANGIOGRAM SELECTIVE EACH ADDITIONAL VESSEL  05/07/2017  . IR RADIOLOGIST EVAL & MGMT  04/16/2017  . IR RADIOLOGIST EVAL & MGMT  05/29/2017  . IR TRANSCATH/EMBOLIZ  05/07/2017  . LOOP RECORDER INSERTION N/A 03/28/2017   Procedure: Loop Recorder Insertion;  Surgeon: Hillis Range, MD;  Location: [facility] INVASIVE CV LAB;  Service: Cardiovascular;  Laterality: N/A;  . MASTECTOMY W/ SENTINEL NODE BIOPSY Right 06/25/2022   Procedure: RIGHT MASTECTOMY WITH AXILLARY SENTINEL LYMPH NODE BIOPSY;  Surgeon: Manus Rudd, MD;  Location: [facility];  Service: General;  Laterality: Right;  . RADIOLOGY WITH ANESTHESIA N/A 05/07/2017   Procedure: EMBOLIZATION;  Surgeon: Julieanne Cotton, MD;  Location: [facility] OR;  Service: Radiology;  Laterality: N/A;  . RADIOLOGY WITH ANESTHESIA N/A 08/06/2017   Procedure: RADIOLOGY WITH ANESTHESIA STENTING;  Surgeon: Julieanne Cotton, MD;  Location: [facility] OR;  Service: Radiology;  Laterality: N/A;  . RIGHT/LEFT HEART CATH AND CORONARY ANGIOGRAPHY N/A 11/26/2022   Procedure: RIGHT/LEFT HEART CATH AND CORONARY ANGIOGRAPHY;  Surgeon: Corky Crafts, MD;  Location: [facility] INVASIVE CV LAB;  Service: Cardiovascular;  Laterality: N/A;  . TEE WITHOUT CARDIOVERSION N/A 03/28/2017   Procedure:  TRANSESOPHAGEAL ECHOCARDIOGRAM (TEE);  Surgeon: Lewayne Bunting, MD;  Location: [facility] ENDOSCOPY;  Service: Cardiovascular;  Laterality: N/A;    Social:  Living Situation: at home with daughter Occupation: none Level of Function: mostly independent in ADLs, requires assistance PCP: Dr Rana Snare Substances: 2 puffs of cigarette/day. Formerly 5-6 cigarettes/day  Family History: noncontributory  Allergies: Allergies as of 09/12/2023 - Review Complete 09/12/2023  Allergen Reaction Noted  . Lisinopril Other (See Comments) 03/23/2020  . Pork-derived products  11/26/2022  . Rosuvastatin Other (See Comments) 03/17/2023    Review of Systems: A complete ROS was negative except as per HPI.   OBJECTIVE:   Physical Exam: Blood pressure (!) 205/100, pulse 92, temperature 98.7 F (37.1 C), temperature source Oral, resp. rate (!) 23, height 5' (1.524 m), weight 40.8 kg, SpO2  100%.  Constitutional: well-appearing, in no acute distress HENT: normocephalic atraumatic, mucous membranes moist Eyes: conjunctiva non-erythematous Neck: supple Cardiovascular: regular rate and rhythm, no m/r/g. Positive JVD Pulmonary/Chest: crackles in bilateral lower lung fields Abdominal: soft, non-tender, non-distended MSK: normal bulk and tone Neurological: alert & oriented x 3, 5/5 strength in bilateral upper and lower extremities, normal gait Skin: warm and dry  Labs: CBC    Component Value Date/Time   WBC 6.2 09/12/2023 1249   RBC 4.35 09/12/2023 1249   HGB 13.6 09/12/2023 1726   HGB 12.1 06/12/2022 0807   HGB 12.4 06/28/2021 1525   HCT 40.0 09/12/2023 1726   HCT 37.8 06/28/2021 1525   PLT 195 09/12/2023 1249   PLT 169 06/12/2022 0807   PLT 201 06/28/2021 1525   MCV 97.9 09/12/2023 1249   MCV 91 06/28/2021 1525   MCH 29.4 09/12/2023 1249   MCHC 30.0 09/12/2023 1249   RDW 14.6 09/12/2023 1249   RDW 12.5 06/28/2021 1525   LYMPHSABS 1.0 09/12/2023 1249   LYMPHSABS 1.7 06/28/2021 1525   MONOABS  0.5 09/12/2023 1249   EOSABS 0.1 09/12/2023 1249   EOSABS 0.2 06/28/2021 1525   BASOSABS 0.0 09/12/2023 1249   BASOSABS 0.0 06/28/2021 1525     CMP     Component Value Date/Time   NA 138 09/12/2023 1726   NA 139 05/08/2023 1438   K 3.9 09/12/2023 1726   CL 106 09/12/2023 1249   CO2 18 (L) 09/12/2023 1249   GLUCOSE 111 (H) 09/12/2023 1249   BUN 22 09/12/2023 1249   BUN 29 (H) 05/08/2023 1438   CREATININE 1.22 (H) 09/12/2023 1249   CREATININE 1.21 (H) 06/12/2022 0807   CALCIUM 9.1 09/12/2023 1249   PROT 6.9 09/12/2023 1249   PROT 7.3 06/30/2023 0805   ALBUMIN 3.3 (L) 09/12/2023 1249   ALBUMIN 4.1 06/30/2023 0805   AST 55 (H) 09/12/2023 1249   AST 17 06/12/2022 0807   ALT 67 (H) 09/12/2023 1249   ALT 15 06/12/2022 0807   ALKPHOS 132 (H) 09/12/2023 1249   BILITOT 0.9 09/12/2023 1249   BILITOT 0.2 06/30/2023 0805   BILITOT 0.4 06/12/2022 0807   GFRNONAA 48 (L) 09/12/2023 1249   GFRNONAA 49 (L) 06/12/2022 0807   GFRAA 68 07/15/2019 1103    Imaging: CT Angio Chest PE W and/or Wo Contrast  Result Date: 09/12/2023 CLINICAL DATA:  High probability for PE. EXAM: CT ANGIOGRAPHY CHEST WITH CONTRAST TECHNIQUE: Multidetector CT imaging of the chest was performed using the standard protocol during bolus administration of intravenous contrast. Multiplanar CT image reconstructions and MIPs were obtained to evaluate the vascular anatomy. RADIATION DOSE REDUCTION: This exam was performed according to the departmental dose-optimization program which includes automated exposure control, adjustment of the mA and/or kV according to patient size and/or use of iterative reconstruction technique. CONTRAST:  75mL OMNIPAQUE IOHEXOL 350 MG/ML SOLN COMPARISON:  None Available. FINDINGS: Cardiovascular: Heart is significantly enlarged. There is a small pericardial effusion. Aorta is normal in size. There are atherosclerotic calcifications of the aorta. There is adequate opacification of the pulmonary  arteries to the segmental level. There is no evidence for pulmonary embolism. Mediastinum/Nodes: No enlarged mediastinal, hilar, or axillary lymph nodes. Thyroid gland, trachea, and esophagus demonstrate no significant findings. Lungs/Pleura: Small left and moderate right pleural effusions are present. There is no pneumothorax. Moderate emphysematous changes are present. Patchy ground-glass opacities are seen in the bilateral lower lobes and right middle lobe. Peripheral nodular density in the right upper  lobe measures 5 mm image 4/61. There is bilateral lower lobe and right middle lobe peribronchial wall thickening. Upper Abdomen: No acute abnormality. Musculoskeletal: No chest wall abnormality. No acute or significant osseous findings. Review of the MIP images confirms the above findings. IMPRESSION: 1. No evidence for pulmonary embolism. 2. Cardiomegaly with small pericardial effusion. 3. Moderate right and small left pleural effusions. 4. Patchy ground-glass opacities in the bilateral lower lobes and right middle lobe worrisome for infection. 5. Bilateral lower lobe and right middle lobe peribronchial wall thickening worrisome for bronchitis 6. Right solid pulmonary nodule within the upper lobe measuring 5 mm. Per Fleischner Society Guidelines,if patient is low risk for malignancy, no routine follow-up imaging is recommended. If patient is high risk for malignancy, a non-contrast Chest CT at 12 months is optional. If performed and the nodule is stable at 12 months, no further follow-up is recommended. These guidelines do not apply to immunocompromised patients and patients with cancer. Follow up in patients with significant comorbidities as clinically warranted. For lung cancer screening, adhere to Lung-RADS guidelines. Reference: Radiology. 2017; 284(1):228-43. 7. Moderate emphysema. Aortic Atherosclerosis (ICD10-I70.0) and Emphysema (ICD10-J43.9). Electronically Signed   By: Darliss Cheney M.D.   On: 09/12/2023  19:49   CT ABDOMEN PELVIS W CONTRAST  Result Date: 09/12/2023 CLINICAL DATA:  Shortness of breath and diarrhea for the last month. Acute nonlocalized abdominal pain EXAM: CT ABDOMEN AND PELVIS WITH CONTRAST TECHNIQUE: Multidetector CT imaging of the abdomen and pelvis was performed using the standard protocol following bolus administration of intravenous contrast. RADIATION DOSE REDUCTION: This exam was performed according to the departmental dose-optimization program which includes automated exposure control, adjustment of the mA and/or kV according to patient size and/or use of iterative reconstruction technique. CONTRAST:  75mL OMNIPAQUE IOHEXOL 350 MG/ML SOLN COMPARISON:  CT abdomen and pelvis 03/27/2017 FINDINGS: Lower chest: Bilateral pleural effusions and ground-glass opacities. See separate report for detailed findings of the chest. Hepatobiliary: Unremarkable. Pancreas: Unremarkable. Spleen: Unremarkable. Adrenals/Urinary Tract: Unremarkable adrenal glands. Low-attenuation lesions in the kidneys are statistically likely to represent cysts. No follow-up is required. No urinary calculi or hydronephrosis. Unremarkable bladder. Stomach/Bowel: Extensive sigmoid diverticulosis. Mild wall thickening with trace fluid about the sigmoid colon suggesting diverticulitis. No obstruction. Stomach and appendix within limits. Vascular/Lymphatic: Aortic atherosclerosis. No enlarged abdominal or pelvic lymph nodes. Unremarkable. Reproductive: Hysterectomy.  No adnexal mass. Other: No free intraperitoneal air. Musculoskeletal: No acute fracture. IMPRESSION: 1. Possible mild uncomplicated sigmoid diverticulitis. 2. Bilateral pleural effusions and ground-glass opacities. See separate report for detailed findings of the chest Aortic Atherosclerosis (ICD10-I70.0). Electronically Signed   By: Minerva Fester M.D.   On: 09/12/2023 19:47   DG Chest 2 View  Result Date: 09/12/2023 CLINICAL DATA:  Shortness of breath EXAM:  CHEST - 2 VIEW COMPARISON:  X-ray 11/21/2022 FINDINGS: Hyperinflation. Increasing small pleural effusions with the adjacent parenchymal opacities. There also some nodular areas suggested such as right midlung and right upper lung. No pneumothorax. Enlarged cardiopericardial silhouette with calcified aorta. Osteopenia. Degenerative changes of the spine IMPRESSION: Slightly enlarged heart with vascular congestion and some interstitial edema. Increasing pleural effusions with the adjacent opacities. There is also a suggestion of some nodular areas in the lung on the right. Although this could be infiltrative underlying lesions are possible. Overall recommend a follow-up contrast chest CT when appropriate Electronically Signed   By: Karen Kays M.D.   On: 09/12/2023 15:12    ASSESSMENT & PLAN:   Assessment & Plan by Problem: Active Problems:   *  No active hospital problems. *   Akosua Cardiel is a 67 y.o. person living with a history of HFrEF, HTN, CAD who presented with dyspnea and admitted for HF exacerbation on hospital day 0   #HFrEF 40-45% exacerbation #Dyspnea Patient presenting with several weeks of worsening dyspnea, nocturnal orthopnea. Imaging showing bilateral pleural effusions and patchy bilateral ground-glass opacities. Low concern for infectious etiology as patient has been afebrile with no leukocytosis and clear sputum production.  -Lasix 40mg  IV -Echo ordered, last was in March 2024 -Low sodium diet -Strict I's and O's -Daily weights  #Diarrhea Pt reports up to 10 instances of diarrhea per day with incontinence for over a month. Etiologies may include severe diverticulosis, possibly related to bowel edema from HFrEF -Trend BMP for GI electrolyte losses  #Elevated creatinine Cr 1.22, appears to be around baseline. Favored to be elevated creatinine rather than AKI. Creatinine clearance may be overestimated because of low BMI  #HTN BP at admission 233/113. Patient not adherent  to home antihypertensives -continuous nitroglycerin infusion  #Lung Nodule Incidental finding on chest CT, 5mm right solid pulmonary nodule. As patient has history of cancer and long smoking history, non-con chest CT at 12 months is optional  Diet: Low sodium diet VTE: Heparin IVF: None Code: Full  Prior to Admission Living Arrangement: Home Anticipated Discharge Location: pending PT/OT eval Barriers to Discharge: pending medical stability  Dispo: Admit patient to Inpatient with expected length of stay greater than 2 midnights.  Signed: Monna Fam, MD Internal Medicine Resident PGY-1  09/12/2023, 9:12 PM

## 2023-09-12 NOTE — H&P (Signed)
Date: 09/12/2023               Patient Name:  Amy Howell MRN: [id number]  DOB: 1956/08/15 Age / Sex: 67 y.o., female   PCP: Rana Snare, DO         Medical Service: Internal Medicine Teaching Service         Attending Physician: Dr. Lonell Grandchild, MD      First Contact: Dr. Monna Fam, MD Pager [phone]    Second Contact: Dr. Marrianne Mood, MD Pager [phone]         After Hours (After 5p/  First Contact Pager: [phone]  weekends / holidays): Second Contact Pager: [phone]   SUBJECTIVE   Chief Complaint: shortness of breath, diarrhea  History of Present Illness:  Patient is a 67 year old woman with past medical history significant for HFrEF, HTN, HLD, CAD, malignant neoplasm of right breast s/p mastectomy, presenting with shortness of breath and diarrhea. Patient reports for the past month having increasing shortness of breath with exertion. She states she can no longer sleep flat, now requiring up to 8 pillows to sleep. She becomes short of breath while conversing and while on the toilet. She has also had increased clear sputum production over the last several weeks. Her daughter brought her to the ED today while seeing her out of breath and sweating while on the toilet. Patient denies chest pain, fever, nausea, vomiting, abdominal pain.   Also in the last several weeks, patient reports many episodes of diarrhea per day. She feels these episodes began last month after eating some bad shrimp. She has up to ten episodes of diarrhea per day, to the point where she has started wearing incontinence diapers. She describes her stool as clear or yellow mucus with very small formed stools. Denies hematochezia or melena.   ED Course: Patient was HDS, alert and oriented x4, satting 100% ORA WBC 6.2, Hgb 12.8, Cr 1.22, BNP 2,632  CTAP shows possible mild uncomplicated sigmoid diverticulitis, bilateral pleural effusions and ground glass opacities  CT PE negative for PE, right  pulmonary nodule   Past Medical History: Past Medical History:  Diagnosis Date   Cataracts, bilateral    Cerebral infarction due to carotid artery stenosis (HCC)    Diabetes mellitus type 2 in nonobese (HCC)    Glaucoma    Hypertension    Hypertensive emergency 11/22/2022   Stroke [facility])    no residual, "series of mini strokes"   Tobacco abuse    Vertigo     Meds:  No current facility-administered medications on file prior to encounter.   Current Outpatient Medications on File Prior to Encounter  Medication Sig Dispense Refill   aspirin EC 325 MG tablet Take 325 mg by mouth daily.     atorvastatin (LIPITOR) 40 MG tablet Take 1 tablet (40 mg total) by mouth daily. 90 tablet 3   carvedilol (COREG) 6.25 MG tablet Take 1 tablet (6.25 mg total) by mouth 2 (two) times daily. 180 tablet 3   dorzolamide (TRUSOPT) 2 % ophthalmic solution Instill 1 drop into both eyes twice a day 30 mL 3   feeding supplement, ENSURE ENLIVE, (ENSURE ENLIVE) LIQD Take 237 mLs by mouth daily.     hydrochlorothiazide (HYDRODIURIL) 12.5 MG tablet Take 1 tablet (12.5 mg total) by mouth daily. 30 tablet 2   latanoprost (XALATAN) 0.005 % ophthalmic solution Instill 1 drop into both eyes at bedtime 22.5 mL 3   spironolactone (ALDACTONE) 25 MG tablet Take  1 tablet (25 mg total) by mouth daily. 30 tablet 11   ticagrelor (BRILINTA) 90 MG TABS tablet Take 1 tablet (90 mg total) by mouth 2 (two) times daily. (Patient taking differently: Take 0.5 tablets by mouth 2 (two) times daily.) 60 tablet 5   timolol (TIMOPTIC) 0.5 % ophthalmic solution Place 1 drop into both eyes every morning. 45 mL 3   aspirin 81 MG chewable tablet Chew 1 tablet (81 mg total) by mouth daily. (Patient not taking: Reported on 09/12/2023) 30 tablet 3     Past Surgical History:  Procedure Laterality Date   ABDOMINAL HYSTERECTOMY     CARDIAC DEFIBRILLATOR PLACEMENT     CORONARY STENT INTERVENTION N/A 11/26/2022   Procedure: CORONARY STENT  INTERVENTION;  Surgeon: Corky Crafts, MD;  Location: [facility];  Service: Cardiovascular;  Laterality: N/A;   gum operation     Implantable loop recorder removal  02/01/2019   MDT Reveal LINQ explantation in office by JA   IR 3D INDEPENDENT WKST  05/07/2017   IR ANGIO INTRA EXTRACRAN SEL COM CAROTID INNOMINATE BILAT MOD SED  04/23/2017   IR ANGIO INTRA EXTRACRAN SEL COM CAROTID INNOMINATE BILAT MOD SED  08/06/2017   IR ANGIO INTRA EXTRACRAN SEL COM CAROTID INNOMINATE UNI R MOD SED  05/07/2017   IR ANGIO VERTEBRAL SEL VERTEBRAL BILAT MOD SED  04/23/2017   IR ANGIO VERTEBRAL SEL VERTEBRAL BILAT MOD SED  08/06/2017   IR ANGIOGRAM FOLLOW UP STUDY  05/07/2017   IR ANGIOGRAM SELECTIVE EACH ADDITIONAL VESSEL  05/07/2017   IR RADIOLOGIST EVAL & MGMT  04/16/2017   IR RADIOLOGIST EVAL & MGMT  05/29/2017   IR TRANSCATH/EMBOLIZ  05/07/2017   LOOP RECORDER INSERTION N/A 03/28/2017   Procedure: Loop Recorder Insertion;  Surgeon: Hillis Range, MD;  Location: [facility];  Service: Cardiovascular;  Laterality: N/A;   MASTECTOMY W/ SENTINEL NODE BIOPSY Right 06/25/2022   Procedure: RIGHT MASTECTOMY WITH AXILLARY SENTINEL LYMPH NODE BIOPSY;  Surgeon: Manus Rudd, MD;  Location: [facility];  Service: General;  Laterality: Right;   RADIOLOGY WITH ANESTHESIA N/A 05/07/2017   Procedure: EMBOLIZATION;  Surgeon: Julieanne Cotton, MD;  Location: [facility] OR;  Service: Radiology;  Laterality: N/A;   RADIOLOGY WITH ANESTHESIA N/A 08/06/2017   Procedure: RADIOLOGY WITH ANESTHESIA STENTING;  Surgeon: Julieanne Cotton, MD;  Location: [facility] OR;  Service: Radiology;  Laterality: N/A;   RIGHT/LEFT HEART CATH AND CORONARY ANGIOGRAPHY N/A 11/26/2022   Procedure: RIGHT/LEFT HEART CATH AND CORONARY ANGIOGRAPHY;  Surgeon: Corky Crafts, MD;  Location: [facility] INVASIVE CV LAB;  Service: Cardiovascular;  Laterality: N/A;   TEE WITHOUT CARDIOVERSION N/A 03/28/2017   Procedure: TRANSESOPHAGEAL ECHOCARDIOGRAM (TEE);   Surgeon: Lewayne Bunting, MD;  Location: [facility] ENDOS[facility];  Service: Cardiovascular;  Laterality: N/A;    Social:  Living Situation: at home with daughter Occupation: none Level of Function: mostly independent in ADLs, requires assistance PCP: Dr Rana Snare Substances: 2 puffs of cigarette/day. Formerly 5-6 cigarettes/day  Family History: noncontributory  Allergies: Allergies as of 09/12/2023 - Review Complete 09/12/2023  Allergen Reaction Noted   Lisinopril Other (See Comments) 03/23/2020   Pork-derived products  11/26/2022   Rosuvastatin Other (See Comments) 03/17/2023    Review of Systems: A complete ROS was negative except as per HPI.   OBJECTIVE:   Physical Exam: Blood pressure (!) 205/100, pulse 92, temperature 98.7 F (37.1 C), temperature source Oral, resp. rate (!) 23, height 5' (1.524 m), weight 40.8 kg, SpO2  100%.  Constitutional: well-appearing, in no acute distress HENT: normocephalic atraumatic, mucous membranes moist Eyes: conjunctiva non-erythematous Neck: supple Cardiovascular: regular rate and rhythm, no m/r/g. Positive JVD Pulmonary/Chest: crackles in bilateral lower lung fields Abdominal: soft, non-tender, non-distended MSK: normal bulk and tone Neurological: alert & oriented x 3, 5/5 strength in bilateral upper and lower extremities, normal gait Skin: warm and dry  Labs: CBC    Component Value Date/Time   WBC 6.2 09/12/2023 1249   RBC 4.35 09/12/2023 1249   HGB 13.6 09/12/2023 1726   HGB 12.1 06/12/2022 0807   HGB 12.4 06/28/2021 1525   HCT 40.0 09/12/2023 1726   HCT 37.8 06/28/2021 1525   PLT 195 09/12/2023 1249   PLT 169 06/12/2022 0807   PLT 201 06/28/2021 1525   MCV 97.9 09/12/2023 1249   MCV 91 06/28/2021 1525   MCH 29.4 09/12/2023 1249   MCHC 30.0 09/12/2023 1249   RDW 14.6 09/12/2023 1249   RDW 12.5 06/28/2021 1525   LYMPHSABS 1.0 09/12/2023 1249   LYMPHSABS 1.7 06/28/2021 1525   MONOABS 0.5 09/12/2023 1249   EOSABS 0.1  09/12/2023 1249   EOSABS 0.2 06/28/2021 1525   BASOSABS 0.0 09/12/2023 1249   BASOSABS 0.0 06/28/2021 1525     CMP     Component Value Date/Time   NA 138 09/12/2023 1726   NA 139 05/08/2023 1438   K 3.9 09/12/2023 1726   CL 106 09/12/2023 1249   CO2 18 (L) 09/12/2023 1249   GLUCOSE 111 (H) 09/12/2023 1249   BUN 22 09/12/2023 1249   BUN 29 (H) 05/08/2023 1438   CREATININE 1.22 (H) 09/12/2023 1249   CREATININE 1.21 (H) 06/12/2022 0807   CALCIUM 9.1 09/12/2023 1249   PROT 6.9 09/12/2023 1249   PROT 7.3 06/30/2023 0805   ALBUMIN 3.3 (L) 09/12/2023 1249   ALBUMIN 4.1 06/30/2023 0805   AST 55 (H) 09/12/2023 1249   AST 17 06/12/2022 0807   ALT 67 (H) 09/12/2023 1249   ALT 15 06/12/2022 0807   ALKPHOS 132 (H) 09/12/2023 1249   BILITOT 0.9 09/12/2023 1249   BILITOT 0.2 06/30/2023 0805   BILITOT 0.4 06/12/2022 0807   GFRNONAA 48 (L) 09/12/2023 1249   GFRNONAA 49 (L) 06/12/2022 0807   GFRAA 68 07/15/2019 1103    Imaging: CT Angio Chest PE W and/or Wo Contrast  Result Date: 09/12/2023 CLINICAL DATA:  High probability for PE. EXAM: CT ANGIOGRAPHY CHEST WITH CONTRAST TECHNIQUE: Multidetector CT imaging of the chest was performed using the standard protocol during bolus administration of intravenous contrast. Multiplanar CT image reconstructions and MIPs were obtained to evaluate the vascular anatomy. RADIATION DOSE REDUCTION: This exam was performed according to the departmental dose-optimization program which includes automated exposure control, adjustment of the mA and/or kV according to patient size and/or use of iterative reconstruction technique. CONTRAST:  75mL OMNIPAQUE IOHEXOL 350 MG/ML SOLN COMPARISON:  None Available. FINDINGS: Cardiovascular: Heart is significantly enlarged. There is a small pericardial effusion. Aorta is normal in size. There are atherosclerotic calcifications of the aorta. There is adequate opacification of the pulmonary arteries to the segmental level. There  is no evidence for pulmonary embolism. Mediastinum/Nodes: No enlarged mediastinal, hilar, or axillary lymph nodes. Thyroid gland, trachea, and esophagus demonstrate no significant findings. Lungs/Pleura: Small left and moderate right pleural effusions are present. There is no pneumothorax. Moderate emphysematous changes are present. Patchy ground-glass opacities are seen in the bilateral lower lobes and right middle lobe. Peripheral nodular density in the right upper  lobe measures 5 mm image 4/61. There is bilateral lower lobe and right middle lobe peribronchial wall thickening. Upper Abdomen: No acute abnormality. Musculoskeletal: No chest wall abnormality. No acute or significant osseous findings. Review of the MIP images confirms the above findings. IMPRESSION: 1. No evidence for pulmonary embolism. 2. Cardiomegaly with small pericardial effusion. 3. Moderate right and small left pleural effusions. 4. Patchy ground-glass opacities in the bilateral lower lobes and right middle lobe worrisome for infection. 5. Bilateral lower lobe and right middle lobe peribronchial wall thickening worrisome for bronchitis 6. Right solid pulmonary nodule within the upper lobe measuring 5 mm. Per Fleischner Society Guidelines,if patient is low risk for malignancy, no routine follow-up imaging is recommended. If patient is high risk for malignancy, a non-contrast Chest CT at 12 months is optional. If performed and the nodule is stable at 12 months, no further follow-up is recommended. These guidelines do not apply to immunocompromised patients and patients with cancer. Follow up in patients with significant comorbidities as clinically warranted. For lung cancer screening, adhere to Lung-RADS guidelines. Reference: Radiology. 2017; 284(1):228-43. 7. Moderate emphysema. Aortic Atherosclerosis (ICD10-I70.0) and Emphysema (ICD10-J43.9). Electronically Signed   By: Darliss Cheney M.D.   On: 09/12/2023 19:49   CT ABDOMEN PELVIS W  CONTRAST  Result Date: 09/12/2023 CLINICAL DATA:  Shortness of breath and diarrhea for the last month. Acute nonlocalized abdominal pain EXAM: CT ABDOMEN AND PELVIS WITH CONTRAST TECHNIQUE: Multidetector CT imaging of the abdomen and pelvis was performed using the standard protocol following bolus administration of intravenous contrast. RADIATION DOSE REDUCTION: This exam was performed according to the departmental dose-optimization program which includes automated exposure control, adjustment of the mA and/or kV according to patient size and/or use of iterative reconstruction technique. CONTRAST:  75mL OMNIPAQUE IOHEXOL 350 MG/ML SOLN COMPARISON:  CT abdomen and pelvis 03/27/2017 FINDINGS: Lower chest: Bilateral pleural effusions and ground-glass opacities. See separate report for detailed findings of the chest. Hepatobiliary: Unremarkable. Pancreas: Unremarkable. Spleen: Unremarkable. Adrenals/Urinary Tract: Unremarkable adrenal glands. Low-attenuation lesions in the kidneys are statistically likely to represent cysts. No follow-up is required. No urinary calculi or hydronephrosis. Unremarkable bladder. Stomach/Bowel: Extensive sigmoid diverticulosis. Mild wall thickening with trace fluid about the sigmoid colon suggesting diverticulitis. No obstruction. Stomach and appendix within limits. Vascular/Lymphatic: Aortic atherosclerosis. No enlarged abdominal or pelvic lymph nodes. Unremarkable. Reproductive: Hysterectomy.  No adnexal mass. Other: No free intraperitoneal air. Musculoskeletal: No acute fracture. IMPRESSION: 1. Possible mild uncomplicated sigmoid diverticulitis. 2. Bilateral pleural effusions and ground-glass opacities. See separate report for detailed findings of the chest Aortic Atherosclerosis (ICD10-I70.0). Electronically Signed   By: Minerva Fester M.D.   On: 09/12/2023 19:47   DG Chest 2 View  Result Date: 09/12/2023 CLINICAL DATA:  Shortness of breath EXAM: CHEST - 2 VIEW COMPARISON:   X-ray 11/21/2022 FINDINGS: Hyperinflation. Increasing small pleural effusions with the adjacent parenchymal opacities. There also some nodular areas suggested such as right midlung and right upper lung. No pneumothorax. Enlarged cardiopericardial silhouette with calcified aorta. Osteopenia. Degenerative changes of the spine IMPRESSION: Slightly enlarged heart with vascular congestion and some interstitial edema. Increasing pleural effusions with the adjacent opacities. There is also a suggestion of some nodular areas in the lung on the right. Although this could be infiltrative underlying lesions are possible. Overall recommend a follow-up contrast chest CT when appropriate Electronically Signed   By: Karen Kays M.D.   On: 09/12/2023 15:12    ASSESSMENT & PLAN:   Assessment & Plan by Problem: Active Problems:   *  No active hospital problems. *   Amy Howell is a 67 y.o. person living with a history of HFrEF, HTN, CAD who presented with dyspnea and admitted for HF exacerbation on hospital day 0   #HFrEF 40-45% exacerbation #Dyspnea Patient presenting with several weeks of worsening dyspnea, nocturnal orthopnea. Imaging showing bilateral pleural effusions and patchy bilateral ground-glass opacities. Low concern for infectious etiology as patient has been afebrile with no leukocytosis and clear sputum production.  -Lasix 40mg  IV -Echo ordered, last was in March 2024 -Low sodium diet -Strict I's and O's -Daily weights  #Diarrhea Pt reports up to 10 instances of diarrhea per day with incontinence for over a month. Describes as frequent mucoid stools with very small volume formed stools. Etiologies may include severe diverticulosis, possibly related to bowel edema from HFrEF -Trend BMP for GI electrolyte losses  #Elevated creatinine Cr 1.22, appears to be around baseline. Favored to be elevated creatinine rather than AKI. Creatinine clearance may be overestimated because of low  BMI  #HTN BP at admission 233/113. Patient variably adherent to home antihypertensives -continuous nitroglycerin infusion  #Lung Nodule Incidental finding on chest CT, 5mm right solid pulmonary nodule. As patient has history of cancer and long smoking history, non-con chest CT at 12 months is optional  Diet: Low sodium diet VTE: Heparin IVF: None Code: Full  Prior to Admission Living Arrangement: Home Anticipated Discharge Location: pending PT/OT eval Barriers to Discharge: pending medical stability  Dispo: Admit patient to Inpatient with expected length of stay greater than 2 midnights.  Signed: Monna Fam, MD Internal Medicine Resident PGY-1  09/12/2023, 9:12 PM

## 2023-09-12 NOTE — ED Notes (Signed)
Pt given snack and drink per request

## 2023-09-12 NOTE — ED Triage Notes (Signed)
Pt reports SOB and diarrhea for the last month. Pt with JVD. Pt awake, alert, appropriate. w

## 2023-09-12 NOTE — Hospital Course (Addendum)
Amy Howell is a 67 y.o. who presented with progressive SOB, orthopnea, loose stools and unintentional weight loss for the last couple of weeks on 09/12/2023. 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   Amy Howell's weight was saturating at 100% ORA, WBC 6.2, Hgb 12.8, Cr 1.22 and BNP was 2,632. CTAP was concerning for bilateral pleural effusions and ground glass opacities,  PE negative but there was a right solid pulmonary nodule within the upper lobe measuring 5mm. She was started on Lasix 40 mg IV and was started on a low sodium diet. An echo was repeated which showed a moderate to large pericardial effusion, EF was 30-35% with global hypokinesis of the left ventricle with moderately reduced function.   The second day of her admission she was found to not be volume overloaded again. Lasix was held for the rest of her admission. Follow up echo showed minimal pericardial fluid. Ultrasound of the pleural effusions showed improvement but there was still a pocket of fluid that could be analyzed via thoracentesis. The risks and benefits of the procedure were discussed with the pt and she denied the procedure. We discussed that the nature of this fluid could be due to several things, including infection, worsening heart failure, and also due to her breast cancer history. She had been prescribed anastrazole but she did not refill it after a month of treatment due to the side effects she was getting (worsening fatigue). She did have bilateral cervical shotty mobile LAD and has been having ongoing cachexia.  -Please ensure she follows up with Oncology  -Please consider PET CT for her  -Please discuss risks and benefits of obtaining surveillance of the lung nodule in 12 months (09/2024)  -Smoking  cessation counseling  -please ensure follow up echo and CXR   For her HTN:  BP at admission was 233/113.  She was placed on a continuous nitroglycerin infusion and her BP decreased to 179/70  She continued on this until her second day of hospitalization when she transitions to Coreg 6.25mg  BID and HCTZ 12.5mg  daily. She was restarted on her spironolactone 25mg  daily and Farxiga 10mg  daily. Her blood pressures remained elevated however on 10/14 she developed hypotension with the addition of BiDil. BiDil was discontinued.  Pt then stated that she was flushing pills behind her daughter's back and did not want her daughter to know. On reconciliation, she was not taking hydrochlorothiazide, Farxiga, Spironolactone, or Atorvastatin. -Follow up with Cardiology within the next 2 weeks.   R MCA Stroke:  10/14 she developed sudden onset left facial and arm weakness, slurry speech. CT head was negative. CTA head and neck concerning for narrowing of the right MCA. MRI with 6 x 8 mm acute infarction in the subcortical white matter of the right posterior frontal lobe. No swelling or hemorrhage. Symptoms were rapidly improving, no TNK administered. She was started on  ASA 81mg  and Brilinta increased to 90mg  BID (she was only taking 45mg  BID).  45mg  BID of Brilinta would not be therapeutic for her. Her Hgb A1C was WNL, LDL was 91. She was not taking her atorvastatin at home, thus was advised to take Atorvastatin 40mg  daily. Angiography was repeated on 10/16 which did not show signs of stent narrowing but aneurysms were  noted.  -Ensure patient is taking: ASA 81mg   Brilinta 90mg  BID  Atorvastatin 40mg   Follow up with Neurology in the next 4 weeks   For her Heart Failure:  She was NOT taking spironolactone or Farxiga at home. These were started during her hospitalization but then discontinued to allow for permissive HTN. Follow up with HF clinic should be done in the next 2 weeks. She is restarting Coreg today. She  will need more tweaking of her GDMT OP. She is not on lasix at home and was not discharged with a home dose due to her being euvolemic.  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 These were not treated at this time as patient may need to gain weight. Will need to follow with Amy Howell in 1-2 months as outpatient to discuss treatment for this.  -Please ensure pt started Mirtazapine 7.5 mg daily at bedtime.  - please ensure daily multivitamin  - please ensure she is drinking Ensure three times a day. -Penergan with Mirtazapine contraindicated due to anticholinergic effect. Should she become nauseous, please consider alternate antiemetic. EKG obtained.  Loose stools  Please follow up with her during clinic. Presented with yellow, mucous filled mildy formed. stools. GI Panel was negative. Elastase was negative. She got azithromycin and completed treatment while inpatient improving. Hgb stable, denies hematochezia or melena.  -Consider BMP  -Consider FU with GI if appropriate. She has a colonoscopy on 11/1 (Ok to stop brilinta 5 days prior to procedure per neuro and cardio) PGY-1

## 2023-09-12 NOTE — ED Provider Triage Note (Signed)
Emergency Medicine Provider Triage Evaluation Note  Amy Howell , a 67 y.o. female  was evaluated in triage.  Pt complains of who presents with shortness of breath.  Patient reports she has had significant weight loss since she was admitted to the hospital back in December.  Patient reports that she has been significantly short of breath for the past 2 months ever since she ate what she thought was bad shrimp at a food truck.  She also reports that her shortness of breath is worse when she tries to use the bathroom.  She has not noted any swelling in her lower extremities but does report that she is having difficulty lying flat..  Review of Systems  Positive: Shortness of breath at rest Negative: Fever  Physical Exam  BP (!) 233/113 (BP Location: Right Arm)   Pulse (!) 113   Temp (!) 97.3 F (36.3 C)   Resp (!) 22   SpO2 97%  Gen:   Awake, no distress   Resp:  Increased effort, words interrupted by need to brief MSK:   Moves extremities without difficulty  Other:  Obvious JVD, decreased lung sounds  Medical Decision Making  Medically screening exam initiated at 12:49 PM.  Appropriate orders placed.  Amy Howell was informed that the remainder of the evaluation will be completed by another provider, this initial triage assessment does not replace that evaluation, and the importance of remaining in the ED until their evaluation is complete.     Arthor Captain, PA-C 09/12/23 1410

## 2023-09-12 NOTE — ED Provider Notes (Addendum)
[facility] Provider Note   CSN: [id number] Arrival date & time: 09/12/23  1215     History  Chief Complaint  Patient presents with   Diarrhea   Shortness of Breath    Amy Howell is a 67 y.o. female.  67 year old female with past medical history significant for CHF presents today for concern of worsening shortness of breath.  She endorses orthopnea and exertional dyspnea.  Reports compliance with her home medications but has not taken any today.  She states her blood pressure is always uncontrolled.  She also endorses some abdominal pain and diarrhea that started after eating from a food truck.  She denies fever.  The history is provided by the patient. No language interpreter was used.  Shortness of Breath Associated symptoms: abdominal pain   Associated symptoms: no chest pain, no fever and no vomiting        Home Medications Prior to Admission medications   Medication Sig Start Date End Date Taking? Authorizing Provider  aspirin 81 MG chewable tablet Chew 1 tablet (81 mg total) by mouth daily. 11/28/22   Danford, Earl Lites, MD  atorvastatin (LIPITOR) 40 MG tablet Take 1 tablet (40 mg total) by mouth daily. 03/17/23   Corky Crafts, MD  carvedilol (COREG) 6.25 MG tablet Take 1 tablet (6.25 mg total) by mouth 2 (two) times daily. 03/17/23   Corky Crafts, MD  dorzolamide (TRUSOPT) 2 % ophthalmic solution Instill 1 drop into both eyes twice a day Patient not taking: Reported on 08/05/2023 02/19/22     dorzolamide (TRUSOPT) 2 % ophthalmic solution Place 1 drop into both eyes 2 (two) times daily. Patient not taking: Reported on 08/05/2023 04/30/23     feeding supplement, ENSURE ENLIVE, (ENSURE ENLIVE) LIQD Take 237 mLs by mouth daily.    [provider]  Garlic (GARLIQUE PO) Patient takes 1 tablet by mouth daily.    [provider]  hydrochlorothiazide (HYDRODIURIL) 12.5 MG tablet Take 1 tablet (12.5 mg  total) by mouth daily. 08/28/23   Rana Snare, DO  latanoprost (XALATAN) 0.005 % ophthalmic solution Instill 1 drop into both eyes at bedtime 02/19/22     latanoprost (XALATAN) 0.005 % ophthalmic solution Place 1 drop into both eyes at bedtime. 04/30/23     spironolactone (ALDACTONE) 25 MG tablet Take 1 tablet (25 mg total) by mouth daily. 07/23/23   Rana Snare, DO  ticagrelor (BRILINTA) 90 MG TABS tablet Take 1 tablet (90 mg total) by mouth 2 (two) times daily. 05/08/23   Corky Crafts, MD  timolol (TIMOPTIC) 0.5 % ophthalmic solution Place 1 drop into both eyes in the morning. 07/10/22     timolol (TIMOPTIC) 0.5 % ophthalmic solution Place 1 drop into both eyes every morning. 04/30/23     triamcinolone cream (KENALOG) 0.1 % Apply 1 application topically 2 (two) times daily. Patient taking differently: Apply 1 application  topically daily as needed (for rash/itching). 12/06/21   Marcine Matar, MD      Allergies    Lisinopril, Pork-derived products, and Rosuvastatin    Review of Systems   Review of Systems  Constitutional:  Negative for chills and fever.  Respiratory:  Positive for shortness of breath.   Cardiovascular:  Negative for chest pain.  Gastrointestinal:  Positive for abdominal pain and diarrhea. Negative for nausea and vomiting.  Neurological:  Negative for light-headedness.  All other systems reviewed and are negative.   Physical Exam Updated Vital Signs  BP (!) 212/111   Pulse (!) 103   Temp 98.7 F (37.1 C) (Oral)   Resp (!) 27   Ht 5' (1.524 m)   Wt 40.8 kg   SpO2 100%   BMI 17.58 kg/m  Physical Exam Vitals and nursing note reviewed.  Constitutional:      General: She is not in acute distress.    Appearance: Normal appearance. She is not ill-appearing.  HENT:     Head: Normocephalic and atraumatic.     Nose: Nose normal.  Eyes:     Conjunctiva/sclera: Conjunctivae normal.  Cardiovascular:     Rate and Rhythm: Regular rhythm. Tachycardia present.   Pulmonary:     Effort: Pulmonary effort is normal. No respiratory distress.     Breath sounds: Rales present.  Abdominal:     General: There is no distension.     Palpations: Abdomen is soft.     Tenderness: There is no abdominal tenderness. There is no guarding.  Musculoskeletal:        General: No deformity. Normal range of motion.     Cervical back: Normal range of motion.  Skin:    Findings: No rash.  Neurological:     Mental Status: She is alert.     ED Results / Procedures / Treatments   Labs (all labs ordered are listed, but only abnormal results are displayed) Labs Reviewed  COMPREHENSIVE METABOLIC PANEL - Abnormal; Notable for the following components:      Result Value   CO2 18 (*)    Glucose, Bld 111 (*)    Creatinine, Ser 1.22 (*)    Albumin 3.3 (*)    AST 55 (*)    ALT 67 (*)    Alkaline Phosphatase 132 (*)    GFR, Estimated 48 (*)    All other components within normal limits  URINALYSIS, ROUTINE W REFLEX MICROSCOPIC - Abnormal; Notable for the following components:   APPearance HAZY (*)    Ketones, ur 5 (*)    Protein, ur 100 (*)    Bacteria, UA RARE (*)    All other components within normal limits  BRAIN NATRIURETIC PEPTIDE - Abnormal; Notable for the following components:   B Natriuretic Peptide 2,632.8 (*)    All other components within normal limits  I-STAT VENOUS BLOOD GAS, ED - Abnormal; Notable for the following components:   pCO2, Ven 33.3 (*)    pO2, Ven 59 (*)    Bicarbonate 17.9 (*)    TCO2 19 (*)    Acid-base deficit 7.0 (*)    Calcium, Ion 1.09 (*)    All other components within normal limits  CBC WITH DIFFERENTIAL/PLATELET  LIPASE, BLOOD  TROPONIN I (HIGH SENSITIVITY)  TROPONIN I (HIGH SENSITIVITY)    EKG None  Radiology DG Chest 2 View  Result Date: 09/12/2023 CLINICAL DATA:  Shortness of breath EXAM: CHEST - 2 VIEW COMPARISON:  X-ray 11/21/2022 FINDINGS: Hyperinflation. Increasing small pleural effusions with the adjacent  parenchymal opacities. There also some nodular areas suggested such as right midlung and right upper lung. No pneumothorax. Enlarged cardiopericardial silhouette with calcified aorta. Osteopenia. Degenerative changes of the spine IMPRESSION: Slightly enlarged heart with vascular congestion and some interstitial edema. Increasing pleural effusions with the adjacent opacities. There is also a suggestion of some nodular areas in the lung on the right. Although this could be infiltrative underlying lesions are possible. Overall recommend a follow-up contrast chest CT when appropriate Electronically Signed   By: Piedad Climes.D.  On: 09/12/2023 15:12    Procedures .Critical Care  Performed by: Marita Kansas, PA-C Authorized by: Marita Kansas, PA-C   Critical care provider statement:    Critical care time (minutes):  30   Critical care was necessary to treat or prevent imminent or life-threatening deterioration of the following conditions:  Respiratory failure   Critical care was time spent personally by me on the following activities:  Development of treatment plan with patient or surrogate, discussions with consultants, evaluation of patient's response to treatment, examination of patient, ordering and review of laboratory studies, ordering and review of radiographic studies, ordering and performing treatments and interventions, pulse oximetry, re-evaluation of patient's condition and review of old charts   Care discussed with: admitting provider       Medications Ordered in ED Medications  nitroGLYCERIN 50 mg in dextrose 5 % 250 mL (0.2 mg/mL) infusion (10 mcg/min Intravenous New Bag/Given 09/12/23 1644)  iohexol (OMNIPAQUE) 350 MG/ML injection 75 mL (75 mLs Intravenous Contrast Given 09/12/23 1739)    ED Course/ Medical Decision Making/ A&P                                 Medical Decision Making Amount and/or Complexity of Data Reviewed Labs: ordered. Radiology: ordered.  Risk Prescription  drug management. Decision regarding hospitalization.   Medical Decision Making / ED Course   This patient presents to the ED for concern of shortness of breath, abdominal pain, this involves an extensive number of treatment options, and is a complaint that carries with it a high risk of complications and morbidity.  The differential diagnosis includes hypertensive emergency, CHF exacerbation, PE, pneumonia, ACS, gastroenteritis, diverticulitis, pancreatitis, appendicitis  MDM: 67 year old female with past medical history significant for uncontrolled hypertension, CHF presents today for concern of progressively worsening shortness of breath.  Has JVD on exam, has symptoms consistent with CHF exacerbation including exertional dyspnea, conversational dyspnea, orthopnea.  Without peripheral edema. Given significantly elevated BP with maps greater than 120 will start patient on nitro drip and provide BiPAP support.  No hypoxia.  Labs obtained.  CT PE study, and abdomen pelvis obtained given the complaint of abdominal pain and elevated LFTs.  Believe LFTs are likely elevated due to hepatic congestion from CHF exacerbation.  Chest x-ray without acute cardiopulmonary process.  CBC without leukocytosis or anemia.  CMP with creatinine of 1.22 otherwise without acute concern.  Mild elevated LFTs.  BNP elevated to 2600.  Lipase within normal.  Troponin negative x 2.  UA without evidence of UTI.  CT a chest without evidence of PE.  Does show concern for pneumonia.  Will start Rocephin and Zithromax.  CT abdomen pelvis shows evidence of diverticulitis.  Will add on Flagyl.  Discussed with internal medicine teaching service will evaluate patient for admission.  Additional history obtained: -Additional history obtained from previous ED visits and admissions. -External records from outside source obtained and reviewed including: Chart review including previous notes, labs, imaging, consultation notes   Lab  Tests: -I ordered, reviewed, and interpreted labs.   The pertinent results include:   Labs Reviewed  COMPREHENSIVE METABOLIC PANEL - Abnormal; Notable for the following components:      Result Value   CO2 18 (*)    Glucose, Bld 111 (*)    Creatinine, Ser 1.22 (*)    Albumin 3.3 (*)    AST 55 (*)    ALT 67 (*)  Alkaline Phosphatase 132 (*)    GFR, Estimated 48 (*)    All other components within normal limits  URINALYSIS, ROUTINE W REFLEX MICROSCOPIC - Abnormal; Notable for the following components:   APPearance HAZY (*)    Ketones, ur 5 (*)    Protein, ur 100 (*)    Bacteria, UA RARE (*)    All other components within normal limits  BRAIN NATRIURETIC PEPTIDE - Abnormal; Notable for the following components:   B Natriuretic Peptide 2,632.8 (*)    All other components within normal limits  I-STAT VENOUS BLOOD GAS, ED - Abnormal; Notable for the following components:   pCO2, Ven 33.3 (*)    pO2, Ven 59 (*)    Bicarbonate 17.9 (*)    TCO2 19 (*)    Acid-base deficit 7.0 (*)    Calcium, Ion 1.09 (*)    All other components within normal limits  CBC WITH DIFFERENTIAL/PLATELET  LIPASE, BLOOD  TROPONIN I (HIGH SENSITIVITY)  TROPONIN I (HIGH SENSITIVITY)      EKG  EKG Interpretation Date/Time:    Ventricular Rate:    PR Interval:    QRS Duration:    QT Interval:    QTC Calculation:   R Axis:      Text Interpretation:           Imaging Studies ordered: I ordered imaging studies including chest x-ray, CTA chest PE study, CT abdomen pelvis with contrast I independently visualized and interpreted imaging. I agree with the radiologist interpretation   Medicines ordered and prescription drug management: Meds ordered this encounter  Medications   nitroGLYCERIN 50 mg in dextrose 5 % 250 mL (0.2 mg/mL) infusion   iohexol (OMNIPAQUE) 350 MG/ML injection 75 mL   cefTRIAXone (ROCEPHIN) 1 g in sodium chloride 0.9 % 100 mL IVPB    Order Specific Question:   Antibiotic  Indication:    Answer:   CAP   azithromycin (ZITHROMAX) 500 mg in sodium chloride 0.9 % 250 mL IVPB    Order Specific Question:   Antibiotic Indication:    Answer:   CAP   metroNIDAZOLE (FLAGYL) IVPB 500 mg    Order Specific Question:   Antibiotic Indication:    Answer:   Intra-abdominal Infection    -I have reviewed the patients home medicines and have made adjustments as needed  Critical interventions Nitro drip, BiPAP, antibiotics   Cardiac Monitoring: The patient was maintained on a cardiac monitor.  I personally viewed and interpreted the cardiac monitored which showed an underlying rhythm of: Sinus tach  Reevaluation: After the interventions noted above, I reevaluated the patient and found that they have :improved  Co morbidities that complicate the patient evaluation  Past Medical History:  Diagnosis Date   Cataracts, bilateral    Cerebral infarction due to carotid artery stenosis (HCC)    Diabetes mellitus type 2 in nonobese (HCC)    Glaucoma    Hypertension    Hypertensive emergency 11/22/2022   Stroke [facility])    no residual, "series of mini strokes"   Tobacco abuse    Vertigo       Dispostion: Patient discussed with internal medicine team who will evaluate patient for admission.  Final Clinical Impression(s) / ED Diagnoses Final diagnoses:  Hypertensive emergency  Acute on chronic congestive heart failure, unspecified heart failure type (HCC)  Pneumonia due to infectious organism, unspecified laterality, unspecified part of lung  Diverticulitis    Rx / DC Orders ED Discharge Orders  None         Marita Kansas, PA-C 09/12/23 2102    Marita Kansas, PA-C 09/12/23 2117    Lonell Grandchild, MD 09/12/23 2132

## 2023-09-12 NOTE — ED Notes (Signed)
Pt taken off BiPAP per PA at bedside.

## 2023-09-13 ENCOUNTER — Inpatient Hospital Stay (HOSPITAL_COMMUNITY): Payer: Medicare (Managed Care)

## 2023-09-13 DIAGNOSIS — I5023 Acute on chronic systolic (congestive) heart failure: Secondary | ICD-10-CM

## 2023-09-13 DIAGNOSIS — J90 Pleural effusion, not elsewhere classified: Secondary | ICD-10-CM | POA: Diagnosis not present

## 2023-09-13 DIAGNOSIS — I3139 Other pericardial effusion (noninflammatory): Secondary | ICD-10-CM | POA: Diagnosis not present

## 2023-09-13 DIAGNOSIS — I5022 Chronic systolic (congestive) heart failure: Secondary | ICD-10-CM | POA: Diagnosis not present

## 2023-09-13 DIAGNOSIS — I2510 Atherosclerotic heart disease of native coronary artery without angina pectoris: Secondary | ICD-10-CM

## 2023-09-13 DIAGNOSIS — E43 Unspecified severe protein-calorie malnutrition: Secondary | ICD-10-CM | POA: Insufficient documentation

## 2023-09-13 DIAGNOSIS — R636 Underweight: Secondary | ICD-10-CM | POA: Insufficient documentation

## 2023-09-13 DIAGNOSIS — E872 Acidosis, unspecified: Secondary | ICD-10-CM | POA: Insufficient documentation

## 2023-09-13 LAB — ECHOCARDIOGRAM COMPLETE
AR max vel: 1.96 cm2
AV Area VTI: 1.87 cm2
AV Area mean vel: 1.77 cm2
AV Mean grad: 8 mm[Hg]
AV Peak grad: 15.2 mm[Hg]
Ao pk vel: 1.95 m/s
Area-P 1/2: 5.97 cm2
Height: 60 in
MV M vel: 5.52 m/s
MV Peak grad: 121.9 mm[Hg]
P 1/2 time: 360 ms
S' Lateral: 3.5 cm
Weight: 1440 [oz_av]

## 2023-09-13 LAB — BASIC METABOLIC PANEL
Anion gap: 12 (ref 5–15)
BUN: 16 mg/dL (ref 8–23)
CO2: 22 mmol/L (ref 22–32)
Calcium: 8.2 mg/dL — ABNORMAL LOW (ref 8.9–10.3)
Chloride: 101 mmol/L (ref 98–111)
Creatinine, Ser: 1.26 mg/dL — ABNORMAL HIGH (ref 0.44–1.00)
GFR, Estimated: 46 mL/min — ABNORMAL LOW (ref >60)
Glucose, Bld: 114 mg/dL — ABNORMAL HIGH (ref 70–99)
Potassium: 3.1 mmol/L — ABNORMAL LOW (ref 3.5–5.1)
Sodium: 135 mmol/L (ref 135–145)

## 2023-09-13 LAB — TROPONIN I (HIGH SENSITIVITY)
Troponin I (High Sensitivity): 22 ng/L — ABNORMAL HIGH (ref <18)
Troponin I (High Sensitivity): 18 ng/L — ABNORMAL HIGH (ref <18)

## 2023-09-13 LAB — MAGNESIUM: Magnesium: 1.6 mg/dL — ABNORMAL LOW (ref 1.7–2.4)

## 2023-09-13 MED ORDER — LATANOPROST 0.005 % OP SOLN
1.0000 [drp] | Freq: Every day | OPHTHALMIC | Status: DC
  Administered 2023-09-13 – 2023-09-16 (×4): 1 [drp] via OPHTHALMIC
  Filled 2023-09-13: qty 2.5

## 2023-09-13 MED ORDER — PROCHLORPERAZINE MALEATE 5 MG PO TABS: 5.0000 mg | ORAL_TABLET | Freq: Four times a day (QID) | ORAL | Status: DC | PRN

## 2023-09-13 MED ORDER — MAGNESIUM SULFATE 4 GM/100ML IV SOLN
4.0000 g | Freq: Once | INTRAVENOUS | Status: AC
  Administered 2023-09-13: 4 g via INTRAVENOUS
  Filled 2023-09-13: qty 100

## 2023-09-13 MED ORDER — ACETAMINOPHEN 325 MG PO TABS
650.0000 mg | ORAL_TABLET | Freq: Four times a day (QID) | ORAL | Status: DC | PRN
  Administered 2023-09-13 – 2023-09-15 (×2): 650 mg via ORAL
  Filled 2023-09-13 (×2): qty 2

## 2023-09-13 MED ORDER — SPIRONOLACTONE 25 MG PO TABS
25.0000 mg | ORAL_TABLET | Freq: Every day | ORAL | Status: DC
  Administered 2023-09-13 – 2023-09-15 (×3): 25 mg via ORAL
  Filled 2023-09-13 (×3): qty 1

## 2023-09-13 MED ORDER — HYDROCHLOROTHIAZIDE 12.5 MG PO TABS
12.5000 mg | ORAL_TABLET | Freq: Every day | ORAL | Status: DC
  Administered 2023-09-13 – 2023-09-15 (×3): 12.5 mg via ORAL
  Filled 2023-09-13 (×3): qty 1

## 2023-09-13 MED ORDER — ALUM & MAG HYDROXIDE-SIMETH 200-200-20 MG/5ML PO SUSP: 15.0000 mL | Freq: Four times a day (QID) | ORAL | Status: DC | PRN

## 2023-09-13 MED ORDER — EMPAGLIFLOZIN 10 MG PO TABS
10.0000 mg | ORAL_TABLET | Freq: Every day | ORAL | Status: DC
  Administered 2023-09-13 – 2023-09-15 (×3): 10 mg via ORAL
  Filled 2023-09-13 (×3): qty 1

## 2023-09-13 MED ORDER — CARVEDILOL 6.25 MG PO TABS
6.2500 mg | ORAL_TABLET | Freq: Two times a day (BID) | ORAL | Status: DC
  Administered 2023-09-13 – 2023-09-15 (×5): 6.25 mg via ORAL
  Filled 2023-09-13 (×5): qty 1

## 2023-09-13 MED ORDER — TICAGRELOR 90 MG PO TABS
45.0000 mg | ORAL_TABLET | Freq: Two times a day (BID) | ORAL | Status: DC
  Administered 2023-09-13 – 2023-09-15 (×5): 45 mg via ORAL
  Filled 2023-09-13 (×4): qty 1

## 2023-09-13 MED ORDER — RIVAROXABAN 10 MG PO TABS
10.0000 mg | ORAL_TABLET | Freq: Every day | ORAL | Status: DC
  Administered 2023-09-13: 10 mg via ORAL
  Filled 2023-09-13: qty 1

## 2023-09-13 MED ORDER — DORZOLAMIDE HCL 2 % OP SOLN
1.0000 [drp] | Freq: Two times a day (BID) | OPHTHALMIC | Status: DC
  Administered 2023-09-13 – 2023-09-16 (×7): 1 [drp] via OPHTHALMIC
  Filled 2023-09-13: qty 10

## 2023-09-13 MED ORDER — POTASSIUM CHLORIDE CRYS ER 20 MEQ PO TBCR
40.0000 meq | EXTENDED_RELEASE_TABLET | Freq: Two times a day (BID) | ORAL | Status: DC
  Administered 2023-09-13 – 2023-09-14 (×4): 40 meq via ORAL
  Filled 2023-09-13 (×4): qty 2

## 2023-09-13 MED ORDER — TIMOLOL MALEATE 0.5 % OP SOLN
1.0000 [drp] | Freq: Every morning | OPHTHALMIC | Status: DC
  Administered 2023-09-14 – 2023-09-16 (×3): 1 [drp] via OPHTHALMIC
  Filled 2023-09-13: qty 5

## 2023-09-13 NOTE — Consult Note (Signed)
The left ventricle  demonstrates global hypokinesis. There is mild left ventricular  hypertrophy. Indeterminate diastolic filling due   to E-A fusion.   3. Right ventricular systolic function is mildly reduced. The right  ventricular size is normal. Tricuspid regurgitation signal is inadequate  for assessing PA pressure.   4. Left atrial size was mildly dilated.   5. The mitral valve is grossly normal. Mild to moderate mitral valve  regurgitation. No evidence of mitral stenosis.   6. The aortic valve is tricuspid. Aortic valve regurgitation is mild to  moderate (BP noted 169/69). No aortic stenosis is present.  Aortic  regurgitation PHT measures 360 msec.   7. The inferior vena cava is normal in size with <50% respiratory  variability, suggesting right atrial pressure of 8 mmHg.   Laboratory Data:  High Sensitivity Troponin:   Recent Labs  Lab 09/12/23 1249 09/12/23 1629 09/13/23 0759 09/13/23 1259  TROPONINIHS 7 14 22* 18*     Chemistry Recent Labs  Lab 09/12/23 1249 09/12/23 1726 09/13/23 0759 09/13/23 1259  NA 137 138  --  135  K 4.3 3.9  --  3.1*  CL 106  --   --  101  CO2 18*  --   --  22  GLUCOSE 111*  --   --  114*  BUN 22  --   --  16  CREATININE 1.22*  --   --  1.26*  CALCIUM 9.1  --   --  8.2*  MG  --   --  1.6*  --   GFRNONAA 48*  --   --  46*  ANIONGAP 13  --   --  12    Recent Labs  Lab 09/12/23 1249  PROT 6.9  ALBUMIN 3.3*  AST 55*  ALT 67*  ALKPHOS 132*  BILITOT 0.9   Lipids No results for input(s): "CHOL", "TRIG", "HDL", "LABVLDL", "LDLCALC", "CHOLHDL" in the last 168 hours.  Hematology Recent Labs  Lab 09/12/23 1249 09/12/23 1726  WBC 6.2  --   RBC 4.35  --   HGB 12.8 13.6  HCT 42.6 40.0  MCV 97.9  --   MCH 29.4  --   MCHC 30.0  --   RDW 14.6  --   PLT 195  --    Thyroid No results for input(s): "TSH", "FREET4" in the last 168 hours.  BNP Recent Labs  Lab 09/12/23 1249  BNP 2,632.8*    DDimer No results for input(s): "DDIMER" in the last 168 hours.   Radiology/Studies:  ECHOCARDIOGRAM COMPLETE  Result Date: 09/13/2023    ECHOCARDIOGRAM REPORT   Patient Name:   Amy Howell Date of Exam: 09/13/2023 Medical Rec #:  [id number]     Height:       60.0 in Accession #:    [id number]    Weight:       90.0 lb Date of Birth:  06-12-1956      BSA:          1.329 m Patient Age:    67 years      BP:           167/69 mmHg Patient Gender: F             HR:           100 bpm. Exam Location:  Inpatient Procedure: 2D Echo, Color Doppler and Cardiac Doppler Indications:    CHF  History:        Patient has prior history  The left ventricle  demonstrates global hypokinesis. There is mild left ventricular  hypertrophy. Indeterminate diastolic filling due   to E-A fusion.   3. Right ventricular systolic function is mildly reduced. The right  ventricular size is normal. Tricuspid regurgitation signal is inadequate  for assessing PA pressure.   4. Left atrial size was mildly dilated.   5. The mitral valve is grossly normal. Mild to moderate mitral valve  regurgitation. No evidence of mitral stenosis.   6. The aortic valve is tricuspid. Aortic valve regurgitation is mild to  moderate (BP noted 169/69). No aortic stenosis is present.  Aortic  regurgitation PHT measures 360 msec.   7. The inferior vena cava is normal in size with <50% respiratory  variability, suggesting right atrial pressure of 8 mmHg.   Laboratory Data:  High Sensitivity Troponin:   Recent Labs  Lab 09/12/23 1249 09/12/23 1629 09/13/23 0759 09/13/23 1259  TROPONINIHS 7 14 22* 18*     Chemistry Recent Labs  Lab 09/12/23 1249 09/12/23 1726 09/13/23 0759 09/13/23 1259  NA 137 138  --  135  K 4.3 3.9  --  3.1*  CL 106  --   --  101  CO2 18*  --   --  22  GLUCOSE 111*  --   --  114*  BUN 22  --   --  16  CREATININE 1.22*  --   --  1.26*  CALCIUM 9.1  --   --  8.2*  MG  --   --  1.6*  --   GFRNONAA 48*  --   --  46*  ANIONGAP 13  --   --  12    Recent Labs  Lab 09/12/23 1249  PROT 6.9  ALBUMIN 3.3*  AST 55*  ALT 67*  ALKPHOS 132*  BILITOT 0.9   Lipids No results for input(s): "CHOL", "TRIG", "HDL", "LABVLDL", "LDLCALC", "CHOLHDL" in the last 168 hours.  Hematology Recent Labs  Lab 09/12/23 1249 09/12/23 1726  WBC 6.2  --   RBC 4.35  --   HGB 12.8 13.6  HCT 42.6 40.0  MCV 97.9  --   MCH 29.4  --   MCHC 30.0  --   RDW 14.6  --   PLT 195  --    Thyroid No results for input(s): "TSH", "FREET4" in the last 168 hours.  BNP Recent Labs  Lab 09/12/23 1249  BNP 2,632.8*    DDimer No results for input(s): "DDIMER" in the last 168 hours.   Radiology/Studies:  ECHOCARDIOGRAM COMPLETE  Result Date: 09/13/2023    ECHOCARDIOGRAM REPORT   Patient Name:   Amy Howell Date of Exam: 09/13/2023 Medical Rec #:  [id number]     Height:       60.0 in Accession #:    [id number]    Weight:       90.0 lb Date of Birth:  06-12-1956      BSA:          1.329 m Patient Age:    67 years      BP:           167/69 mmHg Patient Gender: F             HR:           100 bpm. Exam Location:  Inpatient Procedure: 2D Echo, Color Doppler and Cardiac Doppler Indications:    CHF  History:        Patient has prior history  The left ventricle  demonstrates global hypokinesis. There is mild left ventricular  hypertrophy. Indeterminate diastolic filling due   to E-A fusion.   3. Right ventricular systolic function is mildly reduced. The right  ventricular size is normal. Tricuspid regurgitation signal is inadequate  for assessing PA pressure.   4. Left atrial size was mildly dilated.   5. The mitral valve is grossly normal. Mild to moderate mitral valve  regurgitation. No evidence of mitral stenosis.   6. The aortic valve is tricuspid. Aortic valve regurgitation is mild to  moderate (BP noted 169/69). No aortic stenosis is present.  Aortic  regurgitation PHT measures 360 msec.   7. The inferior vena cava is normal in size with <50% respiratory  variability, suggesting right atrial pressure of 8 mmHg.   Laboratory Data:  High Sensitivity Troponin:   Recent Labs  Lab 09/12/23 1249 09/12/23 1629 09/13/23 0759 09/13/23 1259  TROPONINIHS 7 14 22* 18*     Chemistry Recent Labs  Lab 09/12/23 1249 09/12/23 1726 09/13/23 0759 09/13/23 1259  NA 137 138  --  135  K 4.3 3.9  --  3.1*  CL 106  --   --  101  CO2 18*  --   --  22  GLUCOSE 111*  --   --  114*  BUN 22  --   --  16  CREATININE 1.22*  --   --  1.26*  CALCIUM 9.1  --   --  8.2*  MG  --   --  1.6*  --   GFRNONAA 48*  --   --  46*  ANIONGAP 13  --   --  12    Recent Labs  Lab 09/12/23 1249  PROT 6.9  ALBUMIN 3.3*  AST 55*  ALT 67*  ALKPHOS 132*  BILITOT 0.9   Lipids No results for input(s): "CHOL", "TRIG", "HDL", "LABVLDL", "LDLCALC", "CHOLHDL" in the last 168 hours.  Hematology Recent Labs  Lab 09/12/23 1249 09/12/23 1726  WBC 6.2  --   RBC 4.35  --   HGB 12.8 13.6  HCT 42.6 40.0  MCV 97.9  --   MCH 29.4  --   MCHC 30.0  --   RDW 14.6  --   PLT 195  --    Thyroid No results for input(s): "TSH", "FREET4" in the last 168 hours.  BNP Recent Labs  Lab 09/12/23 1249  BNP 2,632.8*    DDimer No results for input(s): "DDIMER" in the last 168 hours.   Radiology/Studies:  ECHOCARDIOGRAM COMPLETE  Result Date: 09/13/2023    ECHOCARDIOGRAM REPORT   Patient Name:   Amy Howell Date of Exam: 09/13/2023 Medical Rec #:  [id number]     Height:       60.0 in Accession #:    [id number]    Weight:       90.0 lb Date of Birth:  06-12-1956      BSA:          1.329 m Patient Age:    67 years      BP:           167/69 mmHg Patient Gender: F             HR:           100 bpm. Exam Location:  Inpatient Procedure: 2D Echo, Color Doppler and Cardiac Doppler Indications:    CHF  History:        Patient has prior history  The left ventricle  demonstrates global hypokinesis. There is mild left ventricular  hypertrophy. Indeterminate diastolic filling due   to E-A fusion.   3. Right ventricular systolic function is mildly reduced. The right  ventricular size is normal. Tricuspid regurgitation signal is inadequate  for assessing PA pressure.   4. Left atrial size was mildly dilated.   5. The mitral valve is grossly normal. Mild to moderate mitral valve  regurgitation. No evidence of mitral stenosis.   6. The aortic valve is tricuspid. Aortic valve regurgitation is mild to  moderate (BP noted 169/69). No aortic stenosis is present.  Aortic  regurgitation PHT measures 360 msec.   7. The inferior vena cava is normal in size with <50% respiratory  variability, suggesting right atrial pressure of 8 mmHg.   Laboratory Data:  High Sensitivity Troponin:   Recent Labs  Lab 09/12/23 1249 09/12/23 1629 09/13/23 0759 09/13/23 1259  TROPONINIHS 7 14 22* 18*     Chemistry Recent Labs  Lab 09/12/23 1249 09/12/23 1726 09/13/23 0759 09/13/23 1259  NA 137 138  --  135  K 4.3 3.9  --  3.1*  CL 106  --   --  101  CO2 18*  --   --  22  GLUCOSE 111*  --   --  114*  BUN 22  --   --  16  CREATININE 1.22*  --   --  1.26*  CALCIUM 9.1  --   --  8.2*  MG  --   --  1.6*  --   GFRNONAA 48*  --   --  46*  ANIONGAP 13  --   --  12    Recent Labs  Lab 09/12/23 1249  PROT 6.9  ALBUMIN 3.3*  AST 55*  ALT 67*  ALKPHOS 132*  BILITOT 0.9   Lipids No results for input(s): "CHOL", "TRIG", "HDL", "LABVLDL", "LDLCALC", "CHOLHDL" in the last 168 hours.  Hematology Recent Labs  Lab 09/12/23 1249 09/12/23 1726  WBC 6.2  --   RBC 4.35  --   HGB 12.8 13.6  HCT 42.6 40.0  MCV 97.9  --   MCH 29.4  --   MCHC 30.0  --   RDW 14.6  --   PLT 195  --    Thyroid No results for input(s): "TSH", "FREET4" in the last 168 hours.  BNP Recent Labs  Lab 09/12/23 1249  BNP 2,632.8*    DDimer No results for input(s): "DDIMER" in the last 168 hours.   Radiology/Studies:  ECHOCARDIOGRAM COMPLETE  Result Date: 09/13/2023    ECHOCARDIOGRAM REPORT   Patient Name:   Amy Howell Date of Exam: 09/13/2023 Medical Rec #:  [id number]     Height:       60.0 in Accession #:    [id number]    Weight:       90.0 lb Date of Birth:  06-12-1956      BSA:          1.329 m Patient Age:    67 years      BP:           167/69 mmHg Patient Gender: F             HR:           100 bpm. Exam Location:  Inpatient Procedure: 2D Echo, Color Doppler and Cardiac Doppler Indications:    CHF  History:        Patient has prior history  The left ventricle  demonstrates global hypokinesis. There is mild left ventricular  hypertrophy. Indeterminate diastolic filling due   to E-A fusion.   3. Right ventricular systolic function is mildly reduced. The right  ventricular size is normal. Tricuspid regurgitation signal is inadequate  for assessing PA pressure.   4. Left atrial size was mildly dilated.   5. The mitral valve is grossly normal. Mild to moderate mitral valve  regurgitation. No evidence of mitral stenosis.   6. The aortic valve is tricuspid. Aortic valve regurgitation is mild to  moderate (BP noted 169/69). No aortic stenosis is present.  Aortic  regurgitation PHT measures 360 msec.   7. The inferior vena cava is normal in size with <50% respiratory  variability, suggesting right atrial pressure of 8 mmHg.   Laboratory Data:  High Sensitivity Troponin:   Recent Labs  Lab 09/12/23 1249 09/12/23 1629 09/13/23 0759 09/13/23 1259  TROPONINIHS 7 14 22* 18*     Chemistry Recent Labs  Lab 09/12/23 1249 09/12/23 1726 09/13/23 0759 09/13/23 1259  NA 137 138  --  135  K 4.3 3.9  --  3.1*  CL 106  --   --  101  CO2 18*  --   --  22  GLUCOSE 111*  --   --  114*  BUN 22  --   --  16  CREATININE 1.22*  --   --  1.26*  CALCIUM 9.1  --   --  8.2*  MG  --   --  1.6*  --   GFRNONAA 48*  --   --  46*  ANIONGAP 13  --   --  12    Recent Labs  Lab 09/12/23 1249  PROT 6.9  ALBUMIN 3.3*  AST 55*  ALT 67*  ALKPHOS 132*  BILITOT 0.9   Lipids No results for input(s): "CHOL", "TRIG", "HDL", "LABVLDL", "LDLCALC", "CHOLHDL" in the last 168 hours.  Hematology Recent Labs  Lab 09/12/23 1249 09/12/23 1726  WBC 6.2  --   RBC 4.35  --   HGB 12.8 13.6  HCT 42.6 40.0  MCV 97.9  --   MCH 29.4  --   MCHC 30.0  --   RDW 14.6  --   PLT 195  --    Thyroid No results for input(s): "TSH", "FREET4" in the last 168 hours.  BNP Recent Labs  Lab 09/12/23 1249  BNP 2,632.8*    DDimer No results for input(s): "DDIMER" in the last 168 hours.   Radiology/Studies:  ECHOCARDIOGRAM COMPLETE  Result Date: 09/13/2023    ECHOCARDIOGRAM REPORT   Patient Name:   Amy Howell Date of Exam: 09/13/2023 Medical Rec #:  [id number]     Height:       60.0 in Accession #:    [id number]    Weight:       90.0 lb Date of Birth:  06-12-1956      BSA:          1.329 m Patient Age:    67 years      BP:           167/69 mmHg Patient Gender: F             HR:           100 bpm. Exam Location:  Inpatient Procedure: 2D Echo, Color Doppler and Cardiac Doppler Indications:    CHF  History:        Patient has prior history  Cardiology Consultation   Patient ID: Amy Howell MRN: [id number]; DOB: Nov 22, 1956  Admit date: 09/12/2023 Date of Consult: 09/13/2023  PCP:  Rana Snare, DO   [facility] Providers Cardiologist:  Maisie Fus, MD   Patient Profile:   Derian Pfost is a 67 y.o. female with a hx of carotid artery stenosis, hypertension, vertigo, CAD, DM2, brain aneurysm with stent placed, CVA, right breast cancer s/p mastectomy and did not receive radiation or chemotherapy, tobacco abuse,, and angioedema with lisinopril who is being seen 09/13/2023 for the evaluation of pericardial effusion at the request of Dr. Antony Contras.  History of Present Illness:   Ms. 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  TEE was unremarkable with no PFO.  ILR was placed and followed by Dr. Johney Frame.  As part of her workup she was found to have a cerebral aneurysm with outpatient referral to neurology.  She underwent staged embolization of right MCA aneurysms with IR 05/07/2017.    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  She was initially started on anastrozole per oncology but was lost to follow-up.  She was hospitalized 11/2022 with orthopnea and dyspnea on exertion found to be volume overloaded in the ER with hypoxia in the 70s and BNP 1500.  She was also hypertensive with SBP greater than 200 mmHg.  Echocardiogram showed LVEF 30-35%.  Heart catheterization showed a mid RCA stenosis of 75% successfully treated with DES-RCA.  GDMT and was started with losartan, Coreg, and spironolactone.  Hospitalization was complicated by AKI.  CHF felt likely a mixed etiology from CAD and hypertension.  She was seen in CHF clinic earlier this year.  Due to her lisinopril allergy, she was deemed not a candidate for Entresto.  She later stopped losartan due to extreme headache.  She refused to rechallenge with this  medication.  She also stopped Crestor due to headache.  She also was agreeable to taking 6.25 mg Coreg every morning and 3.125 mg every afternoon due to poor sleep.  Overall patient is reluctant to add medications to her regimen.  When she was last seen by CHF clinic 02/04/2023, she refused labs, refused to restart statin medication, and refused referral for lung cancer screening giving weight loss, poor appetite, and ongoing smoking.  She presented to [facility] ED 09/12/2023 with shortness of breath and diarrhea x 1 month.  She also reported unintentional weight loss.  BNP was elevated greater than 2000 with bilateral pleural effusions and groundglass opacities concerning for infection.  She was also noted to have a 5 mm right pulmonary nodule, cardiomegaly, and pericardial effusion.  Follow-up echocardiogram showed an LVEF 30-35%, moderate to large pericardial effusion primarily over the RV, grossly normal IVC, and no signs of tamponade.  Per patient report, she has chosen to take 45 mg of Brilinta twice daily instead of full dose.  Dr. Jiles Garter approved 90 mg Brilinta dose in June 2024.  She has also been taking 325 mg aspirin at home.  Primary service added 10 mg of Xarelto for DVT prophylaxis. She has been managed with Brilinta and aspirin since her cerebral aneurysm was diagnosed per patient. She takes half of the Brilinta   At the bedside, she is not short of breath and she is not having loose stools.  Blood pressure is high   Past Medical History:  Diagnosis Date   Cataracts, bilateral  The left ventricle  demonstrates global hypokinesis. There is mild left ventricular  hypertrophy. Indeterminate diastolic filling due   to E-A fusion.   3. Right ventricular systolic function is mildly reduced. The right  ventricular size is normal. Tricuspid regurgitation signal is inadequate  for assessing PA pressure.   4. Left atrial size was mildly dilated.   5. The mitral valve is grossly normal. Mild to moderate mitral valve  regurgitation. No evidence of mitral stenosis.   6. The aortic valve is tricuspid. Aortic valve regurgitation is mild to  moderate (BP noted 169/69). No aortic stenosis is present.  Aortic  regurgitation PHT measures 360 msec.   7. The inferior vena cava is normal in size with <50% respiratory  variability, suggesting right atrial pressure of 8 mmHg.   Laboratory Data:  High Sensitivity Troponin:   Recent Labs  Lab 09/12/23 1249 09/12/23 1629 09/13/23 0759 09/13/23 1259  TROPONINIHS 7 14 22* 18*     Chemistry Recent Labs  Lab 09/12/23 1249 09/12/23 1726 09/13/23 0759 09/13/23 1259  NA 137 138  --  135  K 4.3 3.9  --  3.1*  CL 106  --   --  101  CO2 18*  --   --  22  GLUCOSE 111*  --   --  114*  BUN 22  --   --  16  CREATININE 1.22*  --   --  1.26*  CALCIUM 9.1  --   --  8.2*  MG  --   --  1.6*  --   GFRNONAA 48*  --   --  46*  ANIONGAP 13  --   --  12    Recent Labs  Lab 09/12/23 1249  PROT 6.9  ALBUMIN 3.3*  AST 55*  ALT 67*  ALKPHOS 132*  BILITOT 0.9   Lipids No results for input(s): "CHOL", "TRIG", "HDL", "LABVLDL", "LDLCALC", "CHOLHDL" in the last 168 hours.  Hematology Recent Labs  Lab 09/12/23 1249 09/12/23 1726  WBC 6.2  --   RBC 4.35  --   HGB 12.8 13.6  HCT 42.6 40.0  MCV 97.9  --   MCH 29.4  --   MCHC 30.0  --   RDW 14.6  --   PLT 195  --    Thyroid No results for input(s): "TSH", "FREET4" in the last 168 hours.  BNP Recent Labs  Lab 09/12/23 1249  BNP 2,632.8*    DDimer No results for input(s): "DDIMER" in the last 168 hours.   Radiology/Studies:  ECHOCARDIOGRAM COMPLETE  Result Date: 09/13/2023    ECHOCARDIOGRAM REPORT   Patient Name:   Amy Howell Date of Exam: 09/13/2023 Medical Rec #:  [id number]     Height:       60.0 in Accession #:    [id number]    Weight:       90.0 lb Date of Birth:  06-12-1956      BSA:          1.329 m Patient Age:    67 years      BP:           167/69 mmHg Patient Gender: F             HR:           100 bpm. Exam Location:  Inpatient Procedure: 2D Echo, Color Doppler and Cardiac Doppler Indications:    CHF  History:        Patient has prior history  The left ventricle  demonstrates global hypokinesis. There is mild left ventricular  hypertrophy. Indeterminate diastolic filling due   to E-A fusion.   3. Right ventricular systolic function is mildly reduced. The right  ventricular size is normal. Tricuspid regurgitation signal is inadequate  for assessing PA pressure.   4. Left atrial size was mildly dilated.   5. The mitral valve is grossly normal. Mild to moderate mitral valve  regurgitation. No evidence of mitral stenosis.   6. The aortic valve is tricuspid. Aortic valve regurgitation is mild to  moderate (BP noted 169/69). No aortic stenosis is present.  Aortic  regurgitation PHT measures 360 msec.   7. The inferior vena cava is normal in size with <50% respiratory  variability, suggesting right atrial pressure of 8 mmHg.   Laboratory Data:  High Sensitivity Troponin:   Recent Labs  Lab 09/12/23 1249 09/12/23 1629 09/13/23 0759 09/13/23 1259  TROPONINIHS 7 14 22* 18*     Chemistry Recent Labs  Lab 09/12/23 1249 09/12/23 1726 09/13/23 0759 09/13/23 1259  NA 137 138  --  135  K 4.3 3.9  --  3.1*  CL 106  --   --  101  CO2 18*  --   --  22  GLUCOSE 111*  --   --  114*  BUN 22  --   --  16  CREATININE 1.22*  --   --  1.26*  CALCIUM 9.1  --   --  8.2*  MG  --   --  1.6*  --   GFRNONAA 48*  --   --  46*  ANIONGAP 13  --   --  12    Recent Labs  Lab 09/12/23 1249  PROT 6.9  ALBUMIN 3.3*  AST 55*  ALT 67*  ALKPHOS 132*  BILITOT 0.9   Lipids No results for input(s): "CHOL", "TRIG", "HDL", "LABVLDL", "LDLCALC", "CHOLHDL" in the last 168 hours.  Hematology Recent Labs  Lab 09/12/23 1249 09/12/23 1726  WBC 6.2  --   RBC 4.35  --   HGB 12.8 13.6  HCT 42.6 40.0  MCV 97.9  --   MCH 29.4  --   MCHC 30.0  --   RDW 14.6  --   PLT 195  --    Thyroid No results for input(s): "TSH", "FREET4" in the last 168 hours.  BNP Recent Labs  Lab 09/12/23 1249  BNP 2,632.8*    DDimer No results for input(s): "DDIMER" in the last 168 hours.   Radiology/Studies:  ECHOCARDIOGRAM COMPLETE  Result Date: 09/13/2023    ECHOCARDIOGRAM REPORT   Patient Name:   Amy Howell Date of Exam: 09/13/2023 Medical Rec #:  [id number]     Height:       60.0 in Accession #:    [id number]    Weight:       90.0 lb Date of Birth:  06-12-1956      BSA:          1.329 m Patient Age:    67 years      BP:           167/69 mmHg Patient Gender: F             HR:           100 bpm. Exam Location:  Inpatient Procedure: 2D Echo, Color Doppler and Cardiac Doppler Indications:    CHF  History:        Patient has prior history  The left ventricle  demonstrates global hypokinesis. There is mild left ventricular  hypertrophy. Indeterminate diastolic filling due   to E-A fusion.   3. Right ventricular systolic function is mildly reduced. The right  ventricular size is normal. Tricuspid regurgitation signal is inadequate  for assessing PA pressure.   4. Left atrial size was mildly dilated.   5. The mitral valve is grossly normal. Mild to moderate mitral valve  regurgitation. No evidence of mitral stenosis.   6. The aortic valve is tricuspid. Aortic valve regurgitation is mild to  moderate (BP noted 169/69). No aortic stenosis is present.  Aortic  regurgitation PHT measures 360 msec.   7. The inferior vena cava is normal in size with <50% respiratory  variability, suggesting right atrial pressure of 8 mmHg.   Laboratory Data:  High Sensitivity Troponin:   Recent Labs  Lab 09/12/23 1249 09/12/23 1629 09/13/23 0759 09/13/23 1259  TROPONINIHS 7 14 22* 18*     Chemistry Recent Labs  Lab 09/12/23 1249 09/12/23 1726 09/13/23 0759 09/13/23 1259  NA 137 138  --  135  K 4.3 3.9  --  3.1*  CL 106  --   --  101  CO2 18*  --   --  22  GLUCOSE 111*  --   --  114*  BUN 22  --   --  16  CREATININE 1.22*  --   --  1.26*  CALCIUM 9.1  --   --  8.2*  MG  --   --  1.6*  --   GFRNONAA 48*  --   --  46*  ANIONGAP 13  --   --  12    Recent Labs  Lab 09/12/23 1249  PROT 6.9  ALBUMIN 3.3*  AST 55*  ALT 67*  ALKPHOS 132*  BILITOT 0.9   Lipids No results for input(s): "CHOL", "TRIG", "HDL", "LABVLDL", "LDLCALC", "CHOLHDL" in the last 168 hours.  Hematology Recent Labs  Lab 09/12/23 1249 09/12/23 1726  WBC 6.2  --   RBC 4.35  --   HGB 12.8 13.6  HCT 42.6 40.0  MCV 97.9  --   MCH 29.4  --   MCHC 30.0  --   RDW 14.6  --   PLT 195  --    Thyroid No results for input(s): "TSH", "FREET4" in the last 168 hours.  BNP Recent Labs  Lab 09/12/23 1249  BNP 2,632.8*    DDimer No results for input(s): "DDIMER" in the last 168 hours.   Radiology/Studies:  ECHOCARDIOGRAM COMPLETE  Result Date: 09/13/2023    ECHOCARDIOGRAM REPORT   Patient Name:   Amy Howell Date of Exam: 09/13/2023 Medical Rec #:  [id number]     Height:       60.0 in Accession #:    [id number]    Weight:       90.0 lb Date of Birth:  06-12-1956      BSA:          1.329 m Patient Age:    67 years      BP:           167/69 mmHg Patient Gender: F             HR:           100 bpm. Exam Location:  Inpatient Procedure: 2D Echo, Color Doppler and Cardiac Doppler Indications:    CHF  History:        Patient has prior history

## 2023-09-13 NOTE — ED Notes (Signed)
ED TO INPATIENT HANDOFF REPORT  ED Nurse Name and Phone #:  Theophilus Bones [phone]  S Name/Age/Gender Amy Howell 67 y.o. female Room/Bed: 039C/039C  Code Status   Code Status: Full Code  Home/SNF/Other Home Patient oriented to: self, place, time, and situation Is this baseline? Yes   Triage Complete: Triage complete  Chief Complaint CHF exacerbation (HCC) [I50.9]  Triage Note Pt reports SOB and diarrhea for the last month. Pt with JVD. Pt awake, alert, appropriate. w   Allergies Allergies  Allergen Reactions   Lisinopril Other (See Comments)    Angioedema   Pork-Derived Products    Rosuvastatin Other (See Comments)    headache    Level of Care/Admitting Diagnosis ED Disposition     ED Disposition  Admit   Condition  --   Comment  Hospital Area: [facility] [[id number]]  Level of Care: Telemetry Cardiac [[id number]]  May admit patient to [facility] or [facility] if equivalent level of care is available:: No  Covid Evaluation: Asymptomatic - no recent exposure (last 10 days) testing not required  Diagnosis: CHF exacerbation [facility]) [[id number]]  Admitting Physician: Reymundo Poll [[id number]]  Attending Physician: Reymundo Poll [[id number]]  Certification:: I certify this patient will need inpatient services for at least 2 midnights  Expected Medical Readiness: 09/16/2023          B Medical/Surgery History Past Medical History:  Diagnosis Date   Cataracts, bilateral    Cerebral infarction due to carotid artery stenosis (HCC)    Diabetes mellitus type 2 in nonobese (HCC)    Glaucoma    Hypertension    Hypertensive emergency 11/22/2022   Stroke [facility])    no residual, "series of mini strokes"   Tobacco abuse    Vertigo    Past Surgical History:  Procedure Laterality Date   ABDOMINAL HYSTERECTOMY     CARDIAC DEFIBRILLATOR PLACEMENT     CORONARY STENT INTERVENTION N/A 11/26/2022   Procedure: CORONARY STENT INTERVENTION;  Surgeon: Corky Crafts, MD;  Location: [facility] INV[facility] LAB;  Service: Cardiovascular;  Laterality: N/A;   gum operation     Implantable loop recorder removal  02/01/2019   MDT Reveal LINQ explantation in office by JA   IR 3D INDEPENDENT WKST  05/07/2017   IR ANGIO INTRA EXTRACRAN SEL COM CAROTID INNOMINATE BILAT MOD SED  04/23/2017   IR ANGIO INTRA EXTRACRAN SEL COM CAROTID INNOMINATE BILAT MOD SED  08/06/2017   IR ANGIO INTRA EXTRACRAN SEL COM CAROTID INNOMINATE UNI R MOD SED  05/07/2017   IR ANGIO VERTEBRAL SEL VERTEBRAL BILAT MOD SED  04/23/2017   IR ANGIO VERTEBRAL SEL VERTEBRAL BILAT MOD SED  08/06/2017   IR ANGIOGRAM FOLLOW UP STUDY  05/07/2017   IR ANGIOGRAM SELECTIVE EACH ADDITIONAL VESSEL  05/07/2017   IR RADIOLOGIST EVAL & MGMT  04/16/2017   IR RADIOLOGIST EVAL & MGMT  05/29/2017   IR TRANSCATH/EMBOLIZ  05/07/2017   LOOP RECORDER INSERTION N/A 03/28/2017   Procedure: Loop Recorder Insertion;  Surgeon: Hillis Range, MD;  Location: [facility];  Service: Cardiovascular;  Laterality: N/A;   MASTECTOMY W/ SENTINEL NODE BIOPSY Right 06/25/2022   Procedure: RIGHT MASTECTOMY WITH AXILLARY SENTINEL LYMPH NODE BIOPSY;  Surgeon: Manus Rudd, MD;  Location: [facility];  Service: General;  Laterality: Right;   RADIOLOGY WITH ANESTHESIA N/A 05/07/2017   Procedure: EMBOLIZATION;  Surgeon: Julieanne Cotton, MD;  Location: [facility] OR;  Service: Radiology;  Laterality: N/A;   RADIOLOGY WITH ANESTHESIA  N/A 08/06/2017   Procedure: RADIOLOGY WITH ANESTHESIA STENTING;  Surgeon: Julieanne Cotton, MD;  Location: [facility] OR;  Service: Radiology;  Laterality: N/A;   RIGHT/LEFT HEART CATH AND CORONARY ANGIOGRAPHY N/A 11/26/2022   Procedure: RIGHT/LEFT HEART CATH AND CORONARY ANGIOGRAPHY;  Surgeon: Corky Crafts, MD;  Location: [facility] INVASIVE CV LAB;  Service: Cardiovascular;  Laterality: N/A;   TEE WITHOUT CARDIOVERSION N/A 03/28/2017   Procedure: TRANSESOPHAGEAL ECHOCARDIOGRAM (TEE);  Surgeon: Lewayne Bunting, MD;   Location: [facility] ENDOSCOPY;  Service: Cardiovascular;  Laterality: N/A;     A IV Location/Drains/Wounds Patient Lines/Drains/Airways Status     Active Line/Drains/Airways     Name Placement date Placement time Site Days   Peripheral IV 09/12/23 20 G 1" Left Antecubital 09/12/23  1640  Antecubital  1            Intake/Output Last 24 hours  Intake/Output Summary (Last 24 hours) at 09/13/2023 1216 Last data filed at 09/13/2023 0330 Gross per 24 hour  Intake --  Output 1000 ml  Net -1000 ml    Labs/Imaging Results for orders placed or performed during the hospital encounter of 09/12/23 (from the past 48 hour(s))  CBC with Differential     Status: None   Collection Time: 09/12/23 12:49 PM  Result Value Ref Range   WBC 6.2 4.0 - 10.5 K/uL   RBC 4.35 3.87 - 5.11 MIL/uL   Hemoglobin 12.8 12.0 - 15.0 g/dL   HCT 16.1 09.6 - 04.5 %   MCV 97.9 80.0 - 100.0 fL   MCH 29.4 26.0 - 34.0 pg   MCHC 30.0 30.0 - 36.0 g/dL   RDW 40.9 81.1 - 91.4 %   Platelets 195 150 - 400 K/uL   nRBC 0.0 0.0 - 0.2 %   Neutrophils Relative % 74 %   Neutro Abs 4.6 1.7 - 7.7 K/uL   Lymphocytes Relative 16 %   Lymphs Abs 1.0 0.7 - 4.0 K/uL   Monocytes Relative 7 %   Monocytes Absolute 0.5 0.1 - 1.0 K/uL   Eosinophils Relative 1 %   Eosinophils Absolute 0.1 0.0 - 0.5 K/uL   Basophils Relative 1 %   Basophils Absolute 0.0 0.0 - 0.1 K/uL   Immature Granulocytes 1 %   Abs Immature Granulocytes 0.03 0.00 - 0.07 K/uL    Comment: Performed at [facility], [address], [place], [place] [address]  Comprehensive metabolic panel     Status: Abnormal   Collection Time: 09/12/23 12:49 PM  Result Value Ref Range   Sodium 137 135 - 145 mmol/L   Potassium 4.3 3.5 - 5.1 mmol/L   Chloride 106 98 - 111 mmol/L   CO2 18 (L) 22 - 32 mmol/L   Glucose, Bld 111 (H) 70 - 99 mg/dL    Comment: Glucose reference range applies only to samples taken after fasting for at least 8 hours.   BUN 22 8 - 23 mg/dL    Creatinine, Ser 6.21 (H) 0.44 - 1.00 mg/dL   Calcium 9.1 8.9 - 30.8 mg/dL   Total Protein 6.9 6.5 - 8.1 g/dL   Albumin 3.3 (L) 3.5 - 5.0 g/dL   AST 55 (H) 15 - 41 U/L   ALT 67 (H) 0 - 44 U/L   Alkaline Phosphatase 132 (H) 38 - 126 U/L   Total Bilirubin 0.9 0.3 - 1.2 mg/dL   GFR, Estimated 48 (L) >60 mL/min    Comment: (NOTE) Calculated using the CKD-EPI Creatinine Equation (2021)  Anion gap 13 5 - 15    Comment: Performed at [facility], [address] [place], [place] [address]  Lipase, blood     Status: None   Collection Time: 09/12/23 12:49 PM  Result Value Ref Range   Lipase 28 11 - 51 U/L    Comment: Performed at [facility], [address], [place], [place] [address]  Urinalysis, Routine w reflex microscopic -Urine, Clean Catch     Status: Abnormal   Collection Time: 09/12/23 12:49 PM  Result Value Ref Range   Color, Urine YELLOW YELLOW   APPearance HAZY (A) CLEAR   Specific Gravity, Urine 1.023 1.005 - 1.030   pH 5.0 5.0 - 8.0   Glucose, UA NEGATIVE NEGATIVE mg/dL   Hgb urine dipstick NEGATIVE NEGATIVE   Bilirubin Urine NEGATIVE NEGATIVE   Ketones, ur 5 (A) NEGATIVE mg/dL   Protein, ur 098 (A) NEGATIVE mg/dL   Nitrite NEGATIVE NEGATIVE   Leukocytes,Ua NEGATIVE NEGATIVE   RBC / HPF 0-5 0 - 5 RBC/hpf   WBC, UA 0-5 0 - 5 WBC/hpf   Bacteria, UA RARE (A) NONE SEEN   Squamous Epithelial / HPF 0-5 0 - 5 /HPF   Mucus PRESENT    Hyaline Casts, UA PRESENT     Comment: Performed at [facility], [address] [place], [place] [address]  Brain natriuretic peptide     Status: Abnormal   Collection Time: 09/12/23 12:49 PM  Result Value Ref Range   B Natriuretic Peptide 2,632.8 (H) 0.0 - 100.0 pg/mL    Comment: Performed at [facility], [address] [place], [place] [address]  Troponin I (High Sensitivity)     Status: None   Collection Time: 09/12/23 12:49 PM  Result Value Ref Range   Troponin I (High Sensitivity) 7 <18 ng/L    Comment:  (NOTE) Elevated high sensitivity troponin I (hsTnI) values and significant  changes across serial measurements may suggest ACS but many other  chronic and acute conditions are known to elevate hsTnI results.  Refer to the "Links" section for chest pain algorithms and additional  guidance. Performed at [facility], [address], [place], [place] [address]   Troponin I (High Sensitivity)     Status: None   Collection Time: 09/12/23  4:29 PM  Result Value Ref Range   Troponin I (High Sensitivity) 14 <18 ng/L    Comment: (NOTE) Elevated high sensitivity troponin I (hsTnI) values and significant  changes across serial measurements may suggest ACS but many other  chronic and acute conditions are known to elevate hsTnI results.  Refer to the "Links" section for chest pain algorithms and additional  guidance. Performed at [facility], [address] [place], [place] [address]   I-Stat venous blood gas, [facility] ED, [facility], [facility])     Status: Abnormal   Collection Time: 09/12/23  5:26 PM  Result Value Ref Range   pH, Ven 7.338 7.25 - 7.43   pCO2, Ven 33.3 (L) 44 - 60 mmHg   pO2, Ven 59 (H) 32 - 45 mmHg   Bicarbonate 17.9 (L) 20.0 - 28.0 mmol/L   TCO2 19 (L) 22 - 32 mmol/L   O2 Saturation 89 %   Acid-base deficit 7.0 (H) 0.0 - 2.0 mmol/L   Sodium 138 135 - 145 mmol/L   Potassium 3.9 3.5 - 5.1 mmol/L   Calcium, Ion 1.09 (L) 1.15 - 1.40 mmol/L   HCT  40.0 36.0 - 46.0 %   Hemoglobin 13.6 12.0 - 15.0 g/dL   Sample type VENOUS   Magnesium     Status: Abnormal   Collection Time: 09/13/23  7:59 AM  Result Value Ref Range   Magnesium 1.6 (L) 1.7 - 2.4 mg/dL    Comment: Performed at [facility], [address] [place], [place] [address]  Troponin I (High Sensitivity)     Status: Abnormal   Collection Time: 09/13/23  7:59 AM  Result Value Ref Range   Troponin I (High Sensitivity) 22 (H) <18 ng/L    Comment: (NOTE) Elevated high sensitivity troponin I (hsTnI) values and  significant  changes across serial measurements may suggest ACS but many other  chronic and acute conditions are known to elevate hsTnI results.  Refer to the "Links" section for chest pain algorithms and additional  guidance. Performed at [facility], [address] [place], [place] [address]    CT Angio Chest PE W and/or Wo Contrast  Result Date: 09/12/2023 CLINICAL DATA:  High probability for PE. EXAM: CT ANGIOGRAPHY CHEST WITH CONTRAST TECHNIQUE: Multidetector CT imaging of the chest was performed using the standard protocol during bolus administration of intravenous contrast. Multiplanar CT image reconstructions and MIPs were obtained to evaluate the vascular anatomy. RADIATION DOSE REDUCTION: This exam was performed according to the departmental dose-optimization program which includes automated exposure control, adjustment of the mA and/or kV according to patient size and/or use of iterative reconstruction technique. CONTRAST:  75mL OMNIPAQUE IOHEXOL 350 MG/ML SOLN COMPARISON:  None Available. FINDINGS: Cardiovascular: Heart is significantly enlarged. There is a small pericardial effusion. Aorta is normal in size. There are atherosclerotic calcifications of the aorta. There is adequate opacification of the pulmonary arteries to the segmental level. There is no evidence for pulmonary embolism. Mediastinum/Nodes: No enlarged mediastinal, hilar, or axillary lymph nodes. Thyroid gland, trachea, and esophagus demonstrate no significant findings. Lungs/Pleura: Small left and moderate right pleural effusions are present. There is no pneumothorax. Moderate emphysematous changes are present. Patchy ground-glass opacities are seen in the bilateral lower lobes and right middle lobe. Peripheral nodular density in the right upper lobe measures 5 mm image 4/61. There is bilateral lower lobe and right middle lobe peribronchial wall thickening. Upper Abdomen: No acute abnormality. Musculoskeletal: No  chest wall abnormality. No acute or significant osseous findings. Review of the MIP images confirms the above findings. IMPRESSION: 1. No evidence for pulmonary embolism. 2. Cardiomegaly with small pericardial effusion. 3. Moderate right and small left pleural effusions. 4. Patchy ground-glass opacities in the bilateral lower lobes and right middle lobe worrisome for infection. 5. Bilateral lower lobe and right middle lobe peribronchial wall thickening worrisome for bronchitis 6. Right solid pulmonary nodule within the upper lobe measuring 5 mm. Per Fleischner Society Guidelines,if patient is low risk for malignancy, no routine follow-up imaging is recommended. If patient is high risk for malignancy, a non-contrast Chest CT at 12 months is optional. If performed and the nodule is stable at 12 months, no further follow-up is recommended. These guidelines do not apply to immunocompromised patients and patients with cancer. Follow up in patients with significant comorbidities as clinically warranted. For lung cancer screening, adhere to Lung-RADS guidelines. Reference: Radiology. 2017; 284(1):228-43. 7. Moderate emphysema. Aortic Atherosclerosis (ICD10-I70.0) and Emphysema (ICD10-J43.9). Electronically Signed   By: Darliss Cheney M.D.   On: 09/12/2023 19:49   CT ABDOMEN PELVIS W CONTRAST  Result Date: 09/12/2023 CLINICAL DATA:  Shortness of breath and  diarrhea for the last month. Acute nonlocalized abdominal pain EXAM: CT ABDOMEN AND PELVIS WITH CONTRAST TECHNIQUE: Multidetector CT imaging of the abdomen and pelvis was performed using the standard protocol following bolus administration of intravenous contrast. RADIATION DOSE REDUCTION: This exam was performed according to the departmental dose-optimization program which includes automated exposure control, adjustment of the mA and/or kV according to patient size and/or use of iterative reconstruction technique. CONTRAST:  75mL OMNIPAQUE IOHEXOL 350 MG/ML SOLN  COMPARISON:  CT abdomen and pelvis 03/27/2017 FINDINGS: Lower chest: Bilateral pleural effusions and ground-glass opacities. See separate report for detailed findings of the chest. Hepatobiliary: Unremarkable. Pancreas: Unremarkable. Spleen: Unremarkable. Adrenals/Urinary Tract: Unremarkable adrenal glands. Low-attenuation lesions in the kidneys are statistically likely to represent cysts. No follow-up is required. No urinary calculi or hydronephrosis. Unremarkable bladder. Stomach/Bowel: Extensive sigmoid diverticulosis. Mild wall thickening with trace fluid about the sigmoid colon suggesting diverticulitis. No obstruction. Stomach and appendix within limits. Vascular/Lymphatic: Aortic atherosclerosis. No enlarged abdominal or pelvic lymph nodes. Unremarkable. Reproductive: Hysterectomy.  No adnexal mass. Other: No free intraperitoneal air. Musculoskeletal: No acute fracture. IMPRESSION: 1. Possible mild uncomplicated sigmoid diverticulitis. 2. Bilateral pleural effusions and ground-glass opacities. See separate report for detailed findings of the chest Aortic Atherosclerosis (ICD10-I70.0). Electronically Signed   By: Minerva Fester M.D.   On: 09/12/2023 19:47   DG Chest 2 View  Result Date: 09/12/2023 CLINICAL DATA:  Shortness of breath EXAM: CHEST - 2 VIEW COMPARISON:  X-ray 11/21/2022 FINDINGS: Hyperinflation. Increasing small pleural effusions with the adjacent parenchymal opacities. There also some nodular areas suggested such as right midlung and right upper lung. No pneumothorax. Enlarged cardiopericardial silhouette with calcified aorta. Osteopenia. Degenerative changes of the spine IMPRESSION: Slightly enlarged heart with vascular congestion and some interstitial edema. Increasing pleural effusions with the adjacent opacities. There is also a suggestion of some nodular areas in the lung on the right. Although this could be infiltrative underlying lesions are possible. Overall recommend a follow-up  contrast chest CT when appropriate Electronically Signed   By: Karen Kays M.D.   On: 09/12/2023 15:12    Pending Labs Unresulted Labs (From admission, onward)     Start     Ordered   09/13/23 1100  Basic metabolic panel  Once-Timed,   TIMED        09/13/23 0416   09/13/23 1100  Magnesium  Once-Timed,   TIMED        09/13/23 0416   09/13/23 1002  Pancreatic elastase, fecal  Once,   R        09/13/23 1002   09/13/23 1002  Gastrointestinal Panel by PCR , Stool  (Gastrointestinal Panel by PCR, Stool                                                                                                                                                     **  Does Not include CLOSTRIDIUM DIFFICILE testing. **If CDIFF testing is needed, place order from the "C Difficile Testing" order set.**)  Once,   R       Question:  Patient immune status  Answer:  Normal   09/13/23 1002            Vitals/Pain Today's Vitals   09/13/23 1030 09/13/23 1045 09/13/23 1100 09/13/23 1115  BP: (!) 162/92 (!) 165/85 (!) 159/89 (!) 173/102  Pulse: (!) 102 (!) 110 93 (!) 109  Resp: 17 (!) 23 (!) 29 (!) 31  Temp:      TempSrc:      SpO2: 100% 100% 100% 100%  Weight:      Height:      PainSc:        Isolation Precautions Enteric precautions (UV disinfection)  Medications Medications  nitroGLYCERIN 50 mg in dextrose 5 % 250 mL (0.2 mg/mL) infusion (40 mcg/min Intravenous New Bag/Given 09/13/23 1204)  atorvastatin (LIPITOR) tablet 40 mg (40 mg Oral Given 09/13/23 1030)  feeding supplement (ENSURE ENLIVE / ENSURE PLUS) liquid 237 mL (237 mLs Oral Given 09/13/23 1204)  aspirin chewable tablet 81 mg (81 mg Oral Given 09/13/23 1030)  heparin injection 5,000 Units (5,000 Units Subcutaneous Patient Refused/Not Given 09/13/23 0647)  acetaminophen (TYLENOL) tablet 650 mg (650 mg Oral Given 09/13/23 0318)  prochlorperazine (COMPAZINE) tablet 5 mg (has no administration in time range)  alum & mag hydroxide-simeth  (MAALOX/MYLANTA) 200-200-20 MG/5ML suspension 15 mL (has no administration in time range)  ticagrelor (BRILINTA) tablet 45 mg (45 mg Oral Given 09/13/23 1029)  iohexol (OMNIPAQUE) 350 MG/ML injection 75 mL (75 mLs Intravenous Contrast Given 09/12/23 1739)    Mobility walks     Focused Assessments Pulmonary Assessment Handoff:  Lung sounds: Bilateral Breath Sounds: Clear O2 Device: Room Air      R Recommendations: See Admitting Provider Note  Report given to:   Additional Notes:

## 2023-09-13 NOTE — ED Notes (Signed)
Admitting at bedside

## 2023-09-13 NOTE — ED Notes (Signed)
Pt states having pain to upper abd that is burning and radiating to left chest and through to left shoulder blade.  Also c/o left leg cramps, lower back pain, and left side headache.  EKG done and MD notified.

## 2023-09-13 NOTE — Plan of Care (Incomplete)
Mrs. Amy Howell PCI of her RCA 11/2022, she has been managed with Brilinta and aspirin since her cerebral aneurysm was diagnosed per patient.  She takes half of the Brilinta She presents here with shortness of breath, she is found to have moderate pericardial effusion.  Does not quite meet echo criteria for tamponade.  She has history of breast cancer. She was not on cardiotoxic chemotherapy. She has been lost to FU with oncology for her breast cancer.  She is here now with cachexia, diarrhea, as well as bilateral pleural effusions planned for thoracentesis. Her EKG shows significant LVH.  Recommendations: -No plans for pericardiocentesis at this time. Can FU limited echo tmrw -Agree with holding Lasix in the setting of a moderate pericardial effusion do not want to decompress her RV any further - can hold brilinta for thoracentesis if needed, then can restart - continue aspirin

## 2023-09-13 NOTE — Progress Notes (Addendum)
Overview: Pt is a 67 year old female with PMHx of HFrEF, HTN, HLD, CAD, malignant neoplasm of right breast s/p mastectomy, presenting with shortness of breath found to have acute HF exacerbation.   Overnight:NAEON   Subjective: States feels better as she is peeing more. She reports her diarrhea as being present for >1 month. She also reports weight loss/failure to gain weight.    Objective: afebrile, slightly tachycardic up to 110. BP improved to 179/70 from 220/110 initially.   Vital signs in last 24 hours: Vitals:   09/13/23 1130 09/13/23 1145 09/13/23 1200 09/13/23 1215  BP: (!) 153/80 (!) 168/79 (!) 162/86 (!) 179/70  Pulse: 88 95 (!) 105 (!) 101  Resp: (!) 35 (!) 26 (!) 22 (!) 36  Temp:      TempSrc:      SpO2: 100% 100% 100% 100%  Weight:      Height:       Supplemental O2: Room Air   SpO2: 100 % FiO2 (%): 30 % Filed Weights   09/12/23 1254  Weight: 40.8 kg    Intake/Output Summary (Last 24 hours) at 09/13/2023 1237 Last data filed at 09/13/2023 0330 Gross per 24 hour  Intake --  Output 1000 ml  Net -1000 ml   Net IO Since Admission: -1,000 mL [09/13/23 1237]  Physical Exam General: NAD, cachetic apepearance HENT: NCAT Lungs: CTAB Cardiovascular: RRR, no lower extremity edema, no JVD Abdomen: No TTP, bowel sounds present MSK: no asymmetry Skin: no lesions noted.  Neuro: alert and oriented x4 Psych: normal mood and normal affect  Diagnostics    Latest Ref Rng & Units 09/12/2023    5:26 PM 09/12/2023   12:49 PM 11/26/2022    9:46 AM  CBC  WBC 4.0 - 10.5 K/uL  6.2    Hemoglobin 12.0 - 15.0 g/dL 16.1  09.6  04.5    40.9   Hematocrit 36.0 - 46.0 % 40.0  42.6  33.0    34.0   Platelets 150 - 400 K/uL  195         Latest Ref Rng & Units 09/12/2023    5:26 PM 09/12/2023   12:49 PM 05/08/2023    2:38 PM  BMP  Glucose 70 - 99 mg/dL  811  74   BUN 8 - 23 mg/dL  22  29   Creatinine 9.14 - 1.00 mg/dL  7.82  9.56   BUN/Creat Ratio 12 - 28   25    Sodium 135 - 145 mmol/L 138  137  139   Potassium 3.5 - 5.1 mmol/L 3.9  4.3  4.9   Chloride 98 - 111 mmol/L  106  102   CO2 22 - 32 mmol/L  18  20   Calcium 8.9 - 10.3 mg/dL  9.1  9.8       Assessment/Plan: Principal Problem:   CHF exacerbation (HCC) Active Problems:   Hypertension   Lung nodule   Elevated serum creatinine   Frequent loose stools  #HFrEF 40-45% exacerbation #R>L Pleural Effusion #Moderate-large pleural effusion #Hx of Breast Cancer Pt status post  40 mg IV lasix x2 since admission. UOP documented at one liter. Repeat echo ordered. Given euvolemic exam, weight loss hx and pleural effusion, will get diagnostic thoracentesis to analyze her fluid. I believe this will help improve her exertional dyspnea. She also has a large pericardial effusion. Exam does not suggest significant volume overload so we are looking into other causes of the pericardial and pleural  effusion. She has hx of breast cancer that was treated with mastectomy with negative margins and was recommended to start anastrozole but that was only dispensed one time. She had no follow up with them since then. This is high on the differential but other causes are possible such including infectious causing given her GI symptoms. No hx of autoimmune disease and no report of trauma. Fortunately, pt is HDS and no sonographic evidence of tamponade. We will move her to progressive floor for care and consult cardiology. They will see the patient. There was a concern that thoracentesis could lead to changes in HDS but after discussion with cardiology this is currently a low concern. I suspect all of the BNP elevation is secondary to the pericardial effusion rather than total volume overload. Will hold lasix. Pt is on limited GDMT. 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 Will hold spironolactone until pt is off gtt and doing well.   -Low sodium diet -Strict I's and O's -Daily weights -Hold lasix  today -follow up thoracentesis studies.  -Cardiology consulted; appreciate their recommendations; will follow up their recommendations   #Chronic Diarrhea Pt with diarrhea over one month period. Reports no abdominal pain. Will get stool studies and GI panel to look for causes of her diarrhea. This diarrhea may be contributing to her weight loss specially if it is malabsorptive in nature.  -Trend BMP for GI electrolyte losses  HLD c/b CAD s/p stent in 11/2022 On lipitor 40 mg every day, brillinta 90 mg BID and aspirin 81 mg daily. Will continue this. She states she is taking half of the brillinta tablet twice daily but notes from her cardiology report she should take it 90 mg BID. She was recommended to take plavix and aspirin per last cardiology note but they are ok with brillinta. Will discuss this further with pt's daughter. Current inpatient regimen is 81 mg aspirin with brillinta 45 mg BID.   #Elevated creatinine Cr 1.22, appears to be around baseline. Favored to be elevated creatinine rather than AKI. Creatinine clearance may be overestimated because of low BMI. Will continue to monitor renal function as we use IV diuretics.    #HTN BP at admission 233/113. Improving to 179/70. Patient variably adherent to home antihypertensives. She is on continuous nitroglycerin infusion but will add her home coreg and hydrochlorothiazide. If BP are less than 140/80s then we will plan to discontinue her gtt.    #Lung Nodule Incidental finding on chest CT, 5mm right solid pulmonary nodule. As patient has history of cancer and long smoking history, non-con chest CT at 12 months is optional   #Glaucoma Chronic. Home eye drops  Diet: Heart Healthy IVF: None VTE: Lovenox Code: Full PT/OT recs: Pending Prior to Admission Living Arrangement: Home Anticipated Discharge Location: Home Barriers to Discharge: Medical Stability Dispo: Anticipated discharge in approximately 2 day(s).   Gwenevere Abbot,  MD Eligha Bridegroom. [facility] Internal Medicine Residency, PGY-3  Pager: [phone] After 5 pm and on weekends: Please call the on-call pager

## 2023-09-14 ENCOUNTER — Inpatient Hospital Stay (HOSPITAL_COMMUNITY): Payer: Medicare (Managed Care)

## 2023-09-14 DIAGNOSIS — I3139 Other pericardial effusion (noninflammatory): Secondary | ICD-10-CM

## 2023-09-14 LAB — CBC WITH DIFFERENTIAL/PLATELET
Abs Immature Granulocytes: 0.03 10*3/uL (ref 0.00–0.07)
Basophils Absolute: 0 10*3/uL (ref 0.0–0.1)
Basophils Relative: 0 %
Eosinophils Absolute: 0.1 10*3/uL (ref 0.0–0.5)
Eosinophils Relative: 1 %
HCT: 38.7 % (ref 36.0–46.0)
Hemoglobin: 12.2 g/dL (ref 12.0–15.0)
Immature Granulocytes: 0 %
Lymphocytes Relative: 10 %
Lymphs Abs: 0.8 10*3/uL (ref 0.7–4.0)
MCH: 28.8 pg (ref 26.0–34.0)
MCHC: 31.5 g/dL (ref 30.0–36.0)
MCV: 91.3 fL (ref 80.0–100.0)
Monocytes Absolute: 0.8 10*3/uL (ref 0.1–1.0)
Monocytes Relative: 9 %
Neutro Abs: 6.5 10*3/uL (ref 1.7–7.7)
Neutrophils Relative %: 80 %
Platelets: 181 10*3/uL (ref 150–400)
RBC: 4.24 MIL/uL (ref 3.87–5.11)
RDW: 14.7 % (ref 11.5–15.5)
WBC: 8.2 10*3/uL (ref 4.0–10.5)
nRBC: 0 % (ref 0.0–0.2)

## 2023-09-14 LAB — ECHOCARDIOGRAM LIMITED
Height: 60 in
Weight: 1386.25 [oz_av]

## 2023-09-14 LAB — BASIC METABOLIC PANEL
Anion gap: 11 (ref 5–15)
BUN: 15 mg/dL (ref 8–23)
CO2: 21 mmol/L — ABNORMAL LOW (ref 22–32)
Calcium: 8.6 mg/dL — ABNORMAL LOW (ref 8.9–10.3)
Chloride: 101 mmol/L (ref 98–111)
Creatinine, Ser: 1.36 mg/dL — ABNORMAL HIGH (ref 0.44–1.00)
GFR, Estimated: 42 mL/min — ABNORMAL LOW (ref >60)
Glucose, Bld: 73 mg/dL (ref 70–99)
Potassium: 4.6 mmol/L (ref 3.5–5.1)
Sodium: 133 mmol/L — ABNORMAL LOW (ref 135–145)

## 2023-09-14 MED ORDER — AZITHROMYCIN 250 MG PO TABS
250.0000 mg | ORAL_TABLET | Freq: Every day | ORAL | Status: AC
  Administered 2023-09-15 – 2023-09-18 (×4): 250 mg via ORAL
  Filled 2023-09-14 (×5): qty 1

## 2023-09-14 MED ORDER — AZITHROMYCIN 250 MG PO TABS
500.0000 mg | ORAL_TABLET | Freq: Every day | ORAL | Status: AC
  Administered 2023-09-14: 500 mg via ORAL
  Filled 2023-09-14: qty 2

## 2023-09-14 NOTE — Progress Notes (Cosign Needed Addendum)
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   Overnight: No acute events overnight  Subjective: Had been peeing a lot. Denies any diarrhea but loose phlegm from her rectum. Formed small stood. Decreased appetite and bilateral LAD on the neck.   Objective:   Vital signs in last 24 hours: Vitals:   09/14/23 0028 09/14/23 0442 09/14/23 0803 09/14/23 1152  BP: 124/71 (!) 155/74 (!) 142/65 (!) 135/93  Pulse: 94 87 91 78  Resp:   16 18  Temp: 99.9 F (37.7 C) 98.1 F (36.7 C) 97.9 F (36.6 C) 97.6 F (36.4 C)  TempSrc: Oral Oral Oral Oral  SpO2: 97% 100%  100%  Weight:  39.3 kg    Height:       Supplemental O2: Room Air Last BM Date : 09/14/23 SpO2: 100 % FiO2 (%): 30 % Filed Weights   09/12/23 1254 09/13/23 1353 09/14/23 0442  Weight: 40.8 kg 40.3 kg 39.3 kg    Intake/Output Summary (Last 24 hours) at 09/14/2023 1306 Last data filed at 09/14/2023 1610 Gross per 24 hour  Intake 1255.3 ml  Output 450 ml  Net 805.3 ml   Net IO Since Admission: -194.7 mL [09/14/23 1306]  Physical Exam General: NAD, cachetic appearance, shotty node on the anterior R cervical neck, shotty node on the left anterior cervical neck HENT: NCAT Lungs: lung sounds decreased on the right upper and middle lobes, decreased tactile fremitus on the right, clear to auscultation on the other lung fields Cardiovascular: RRR, no lower extremity edema, no JVD Abdomen: No TTP, bowel sounds present MSK: no asymmetry Skin: no lesions noted.  Neuro: alert and oriented x4 Psych: normal mood and normal affect  Diagnostics    Latest Ref Rng & Units 09/14/2023    7:52 AM 09/12/2023    5:26 PM 09/12/2023   12:49 PM  CBC  WBC 4.0 - 10.5 K/uL 8.2   6.2   Hemoglobin 12.0 - 15.0 g/dL 96.0  45.4  09.8   Hematocrit 36.0 - 46.0 % 38.7  40.0   42.6   Platelets 150 - 400 K/uL 181   195        Latest Ref Rng & Units 09/14/2023    3:52 AM 09/13/2023   12:59 PM 09/12/2023    5:26 PM  BMP  Glucose 70 - 99 mg/dL 73  119    BUN 8 - 23 mg/dL 15  16    Creatinine 1.47 - 1.00 mg/dL 8.29  5.62    Sodium 130 - 145 mmol/L 133  135  138   Potassium 3.5 - 5.1 mmol/L 4.6  3.1  3.9   Chloride 98 - 111 mmol/L 101  101    CO2 22 - 32 mmol/L 21  22    Calcium 8.9 - 10.3 mg/dL 8.6  8.2        Assessment/Plan: Principal Problem:   Acute on chronic HFrEF (heart failure with reduced ejection fraction) (HCC) Active Problems:   Hypertension   Lung nodule   Elevated serum creatinine   Diarrhea   Protein-calorie malnutrition, severe (HCC)   Metabolic acidosis   Underweight   Pleural effusion  #HFrEF 40-45% exacerbation #R>L Pleural Effusion #Moderate-large pleural effusion #Hx of Breast Cancer Does not have JVD or hypotension. MAP is >90s. She received 40mg  of lasix x 2  and appears euvolemic on exam.Repeat echo today shows improving pleural effusion. Cardiac tamponade not present. Resolution of symptoms suggest more acute etiology likely viral illness versus malignancy. Pt refused thoracentesis due to improved trace pleural effusion. Reports that she could not tolerate anastrazole in the past as it would make her very fatigued. Reports a follow up with oncology this year where she was told that she did not need close follow up, but I am unable to see a note from this year. Has shotty LAD on the neck bilaterally, could be related to viral illness but also concerning for metastasis with ongoing cachexia. Nodes are shotty and mobile, likely reactive but metastatic disease remains in the differential. She will most likely need a follow up PET scan, as well as follow with Oncology for her breast cancer.   -Low sodium diet -Strict I's and O's -Daily weights- standing only -Cardiology consulted; appreciate their recommendations -Would need FU with  heart failure transition team to aid with medications  -Hold lasix today as patient is euvolemic.  -Cw spironolactone 25mg  daily, jardiance 10mg  daily -Not a candidate for Entresto given angioedema with ACE inhibitors   #Chronic Diarrhea Pt reported eating shrimp at a food truck and then developing diarrhea that has not gotten away. On further interview she has loose yellow looking phlegm and very little stool. Has very decreased PO intake and stools are not large. Has no abdominal pain and is afebrile. Episodes are non-bloody. Unable to obtain stool studies and GI panel due to stools being so small.  -continue to trend BMP for GI electrolyte losses -fecal elastase and GI panel pending -Can consider starting Azithromycin for her pna which will also provide GI coverage.   #PNA  Patchy ground-glass opacities in the bilateral lower lobes and right middle lobe worrisome for infection on CTA.  Bilateral lower lobe and right middle lobe peribronchial wall thickening worrisome for bronchitis. Start Z-pack today  #HLD c/b CAD s/p stent in 11/2022 On lipitor 40 mg every day, brillinta 90 mg BID and aspirin 81 mg daily. Per cardiology report she should take it 90 mg BID of Brillinta. Will need to have further discussion with pt if agreeable to increase doe of Brillinta.   #Elevated creatinine Cr 1.36, mildly increased from yesterday but still at baseline. Continue to monitor renal function as we use IV diuretics.    #HTN BP at admission 233/113. Blood pressure now in the mid 140s. Was on nitroglycerin infusion and transitioned to coreg 6.25mg   BID and hydrochlorothiazide 12.5mg  daily. If BP are less than 140/80s consistently then we will plan to discontinue her gtt.    #Lung Nodule Incidental finding on chest CT, 5mm right solid pulmonary nodule. As patient has history of cancer and long smoking history, non-con chest CT at 12 months is optional. Will need to FU with PCP.   #Glaucoma Chronic. Home  eye drops Timolol and Latanoprost.  Diet: Heart Healthy IVF: None VTE: Lovenox Code: Full PT/OT recs: Pending Prior to Admission Living Arrangement: Home Anticipated Discharge Location: Home Barriers to Discharge: Medical Stability Dispo: Anticipated discharge in approximately 2 day(s).   Manuela Neptune, MD  Eligha Bridegroom. [facility] Internal Medicine Residency, PGY-1  Pager: [phone] After 5 pm and on weekends: Please call the on-call pager

## 2023-09-14 NOTE — Progress Notes (Signed)
Patient seen in ultrasound today for possible thoracentesis. Limited ultrasound examination of the left lung showed only trace pleural fluid. The right lung had a few small scattered pockets and there was one pocket that would have been amenable to target for labs. I discussed these findings with the patient and discussed the risks/benefits of the procedure. I discussed that the volume of fluid on the right was small but there was enough to get labs. Patient refused procedure. She stated that given the small volume she did not feel the risks outweighed the benefit of the procedure.   Images saved in Epic. No procedure performed.  Alwyn Ren, [place] [phone] 09/14/2023, 11:34 AM

## 2023-09-14 NOTE — Progress Notes (Deleted)
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   Amy Howell was seen initially in December 2023 when she presented to the ED with complaint of orthopnea and productive cough.  Troponins were completed mildly upward with BNP of 1542.  2D echo was completed showing reduced EF of 30-35% with global hypokinesis and grade 1 DD.  She underwent R/LHC that showed 75% stenosed mid RCA lesion treated with PCI/DES x 1, moderately elevated diastolic pressure and moderate LV systolic function.  She was discharged with GDMT and BP medications ordered.  Patient was seen in follow-up doing well and BP medication was increased.  She was seen in advanced heart failure clinic and reported discontinuing her losartan due to headache.  BP was elevated and patient was taking carvedilol incorrectly.  She had spironolactone and carvedilol increased at that time.  She is not a candidate for Entresto due to angioedema and BP remain elevated with carvedilol increased to 6.25 mg in a.m. and 3.125 mg in p.m. She was seen by Dr. Eldridge Dace on 03/17/2023 and reported walking regularly with no complaints of chest pain  or dyspnea on exertion.  2D echo have been completed showing slight improvement of EF at 40-45%.  She continued to have elevated BP which she described as lower at home and higher in the doctor's office.  She is not in favor of adding additional BP medications was advised to continue to monitor at this time.  She was seen in the ED on 09/12/2023 with complaint of shortness of breath and diarrhea.  She underwent workup with BNP of greater than 2000 and CT of the chest showing bilateral oral effusions with groundglass opacities and 5 mm right pulmonary nodule with pericardial effusion present.  2D echo was completed showing EF of 30-35% with moderate to large pericardial effusion RV with no signs of tamponade.  She was consulted by cardiology due to pericardial effusion.  Patient underwent lung ultrasound for possible thoracentesis however examination of the lungs showed only trace pleural fluid present with small pocket on the right amenable for drainage.  Patient however refused procedure due to small volume she does not feel the risk outweighed the benefit.   During today's visit the patient reports they are ***.  Patient denies chest pain, palpitations, dyspnea, PND, orthopnea, nausea, vomiting, dizziness, syncope, edema, weight gain, or early satiety.   ***Notes: Patient will need surgical clearance for colonoscopy?  Patient is on Brilinta and aspirin from PCP (prescribed for cerebral aneurysm) -Check to see if she is taking her medications -especially the Atorvastatin Review of Systems  Please  see the history of present illness.    All other systems reviewed and are otherwise negative except as noted above.  Physical Exam    Wt Readings from Last 3 Encounters:  09/14/23 86 lb 10.3 oz (39.3 kg)  08/05/23 95 lb (43.1 kg)  05/08/23 94 lb 3.2 oz (42.7 kg)   WU:[id number] were no vitals filed for this visit.,There is no height or weight on file to calculate BMI. GEN: Well nourished, well developed in  no acute distress Neck: No JVD; No carotid bruits Pulmonary: Clear to auscultation without rales, wheezing or rhonchi  Cardiovascular: Normal rate. Regular rhythm. Normal S1. Normal S2.   Murmurs: There is no murmur.  ABDOMEN: Soft, non-tender, non-distended EXTREMITIES:  No edema; No deformity   EKG/LABS/ Recent Cardiac Studies   ECG personally reviewed by me today - ***  Risk Assessment/Calculations:   {Does this patient have ATRIAL FIBRILLATION?[phone]}      Lab Results  Component Value Date   WBC 8.2 09/14/2023   HGB 12.2 09/14/2023   HCT 38.7 09/14/2023   MCV 91.3 09/14/2023   PLT 181 09/14/2023   Lab Results  Component Value Date   CREATININE 1.36 (H) 09/14/2023   BUN 15 09/14/2023   NA 133 (L) 09/14/2023   K 4.6 09/14/2023   CL 101 09/14/2023   CO2 21 (L) 09/14/2023   Lab Results  Component Value Date   CHOL 176 06/30/2023   HDL 50 06/30/2023   LDLCALC 110 (H) 06/30/2023   TRIG 87 06/30/2023   CHOLHDL 3.5 06/30/2023    Lab Results  Component Value Date   HGBA1C 6.0 (H) 05/08/2023   Assessment & Plan    1.  Coronary artery disease: -s/p PCI to RCA in 11/2022 -Today patient reports*** -Continue GDMT with***   2.  Essential hypertension: -Patient has compliance issues with BP management and blood pressure today was*** -Continue***   3.  HFrEF: -2D echo completed 12/203  showing reduced EF of 30-35% and GDMT started however compliance entered progression and repeat 2D completed 01/2023 showing slightly increased EF of 40-45% -Today patient is*** -Continue current GDMT with***   4.  Hyperlipidemia: -Patient's LDL cholesterol was*** -Continue***   5.  Tobacco abuse: -Patient is currently***      Disposition: Follow-up with Maisie Fus, MD or APP in *** months {Are you ordering a CV Procedure (e.g. stress test, cath, DCCV, TEE, etc)?   Press F2        :[id number]}   Signed, Napoleon Form, Leodis Rains, NP 09/14/2023, 1:09 PM Portal  [facility]

## 2023-09-14 NOTE — Plan of Care (Addendum)
Mrs. Heizer was not in her room when I came by today.  Can follow-up echocardiogram for any signs of tamponade. Otherwise if there are any concerns feel free to reach out.  Cardiology will continue to follow

## 2023-09-15 ENCOUNTER — Inpatient Hospital Stay (HOSPITAL_COMMUNITY): Payer: Medicare (Managed Care)

## 2023-09-15 ENCOUNTER — Other Ambulatory Visit (HOSPITAL_COMMUNITY): Payer: Self-pay

## 2023-09-15 ENCOUNTER — Ambulatory Visit: Payer: Medicare (Managed Care) | Admitting: Nurse Practitioner

## 2023-09-15 DIAGNOSIS — I5022 Chronic systolic (congestive) heart failure: Secondary | ICD-10-CM

## 2023-09-15 DIAGNOSIS — I639 Cerebral infarction, unspecified: Secondary | ICD-10-CM

## 2023-09-15 DIAGNOSIS — I10 Essential (primary) hypertension: Secondary | ICD-10-CM

## 2023-09-15 DIAGNOSIS — I3139 Other pericardial effusion (noninflammatory): Secondary | ICD-10-CM | POA: Diagnosis not present

## 2023-09-15 DIAGNOSIS — I5023 Acute on chronic systolic (congestive) heart failure: Secondary | ICD-10-CM | POA: Diagnosis not present

## 2023-09-15 DIAGNOSIS — I420 Dilated cardiomyopathy: Secondary | ICD-10-CM | POA: Diagnosis not present

## 2023-09-15 DIAGNOSIS — I2510 Atherosclerotic heart disease of native coronary artery without angina pectoris: Secondary | ICD-10-CM

## 2023-09-15 DIAGNOSIS — Z720 Tobacco use: Secondary | ICD-10-CM

## 2023-09-15 DIAGNOSIS — E785 Hyperlipidemia, unspecified: Secondary | ICD-10-CM

## 2023-09-15 DIAGNOSIS — Z853 Personal history of malignant neoplasm of breast: Secondary | ICD-10-CM

## 2023-09-15 DIAGNOSIS — Z8673 Personal history of transient ischemic attack (TIA), and cerebral infarction without residual deficits: Secondary | ICD-10-CM | POA: Insufficient documentation

## 2023-09-15 DIAGNOSIS — J90 Pleural effusion, not elsewhere classified: Secondary | ICD-10-CM | POA: Diagnosis not present

## 2023-09-15 LAB — CBC
HCT: 38 % (ref 36.0–46.0)
Hemoglobin: 12.1 g/dL (ref 12.0–15.0)
MCH: 29.7 pg (ref 26.0–34.0)
MCHC: 31.8 g/dL (ref 30.0–36.0)
MCV: 93.1 fL (ref 80.0–100.0)
Platelets: 182 10*3/uL (ref 150–400)
RBC: 4.08 MIL/uL (ref 3.87–5.11)
RDW: 14.6 % (ref 11.5–15.5)
WBC: 6.8 10*3/uL (ref 4.0–10.5)
nRBC: 0 % (ref 0.0–0.2)

## 2023-09-15 LAB — GASTROINTESTINAL PANEL BY PCR, STOOL (REPLACES STOOL CULTURE)

## 2023-09-15 LAB — RENAL FUNCTION PANEL
Albumin: 2.9 g/dL — ABNORMAL LOW (ref 3.5–5.0)
Anion gap: 11 (ref 5–15)
BUN: 17 mg/dL (ref 8–23)
CO2: 18 mmol/L — ABNORMAL LOW (ref 22–32)
Calcium: 9.1 mg/dL (ref 8.9–10.3)
Chloride: 102 mmol/L (ref 98–111)
Creatinine, Ser: 1.39 mg/dL — ABNORMAL HIGH (ref 0.44–1.00)
GFR, Estimated: 41 mL/min — ABNORMAL LOW (ref >60)
Glucose, Bld: 79 mg/dL (ref 70–99)
Phosphorus: 2.8 mg/dL (ref 2.5–4.6)
Potassium: 5.7 mmol/L — ABNORMAL HIGH (ref 3.5–5.1)
Sodium: 131 mmol/L — ABNORMAL LOW (ref 135–145)

## 2023-09-15 LAB — POTASSIUM: Potassium: 4.7 mmol/L (ref 3.5–5.1)

## 2023-09-15 LAB — BLOOD GAS, VENOUS
Acid-base deficit: 2.3 mmol/L — ABNORMAL HIGH (ref 0.0–2.0)
Bicarbonate: 21.2 mmol/L (ref 20.0–28.0)
O2 Saturation: 92.9 %
Patient temperature: 37
pCO2, Ven: 32 mm[Hg] — ABNORMAL LOW (ref 44–60)
pH, Ven: 7.43 (ref 7.25–7.43)
pO2, Ven: 63 mm[Hg] — ABNORMAL HIGH (ref 32–45)

## 2023-09-15 LAB — GLUCOSE, CAPILLARY: Glucose-Capillary: 99 mg/dL (ref 70–99)

## 2023-09-15 MED ORDER — CLOPIDOGREL BISULFATE 75 MG PO TABS: 75.0000 mg | ORAL_TABLET | Freq: Every day | ORAL | Status: DC

## 2023-09-15 MED ORDER — TICAGRELOR 90 MG PO TABS
45.0000 mg | ORAL_TABLET | Freq: Once | ORAL | Status: AC
  Administered 2023-09-15: 45 mg via ORAL
  Filled 2023-09-15: qty 1

## 2023-09-15 MED ORDER — SODIUM ZIRCONIUM CYCLOSILICATE 10 G PO PACK
10.0000 g | PACK | Freq: Once | ORAL | Status: AC
  Administered 2023-09-15: 10 g via ORAL
  Filled 2023-09-15: qty 1

## 2023-09-15 MED ORDER — ISOSORB DINITRATE-HYDRALAZINE 20-37.5 MG PO TABS
1.0000 | ORAL_TABLET | Freq: Three times a day (TID) | ORAL | Status: DC
  Administered 2023-09-15: 1 via ORAL
  Filled 2023-09-15: qty 1

## 2023-09-15 MED ORDER — IOHEXOL 350 MG/ML SOLN
75.0000 mL | Freq: Once | INTRAVENOUS | Status: AC | PRN
  Administered 2023-09-15: 75 mL via INTRAVENOUS

## 2023-09-15 MED ORDER — TICAGRELOR 90 MG PO TABS
90.0000 mg | ORAL_TABLET | Freq: Two times a day (BID) | ORAL | Status: DC
  Administered 2023-09-15 – 2023-09-18 (×5): 90 mg via ORAL
  Filled 2023-09-15 (×5): qty 1

## 2023-09-15 MED ORDER — ONDANSETRON HCL 4 MG/2ML IJ SOLN
INTRAMUSCULAR | Status: AC
  Filled 2023-09-15: qty 2

## 2023-09-15 MED ORDER — CLOPIDOGREL BISULFATE 75 MG PO TABS
75.0000 mg | ORAL_TABLET | Freq: Every day | ORAL | Status: DC
Start: 2023-09-15 — End: 2023-09-15

## 2023-09-15 NOTE — Consult Note (Signed)
Neurology Consultation Reason for Consult: CODE STROKE Referring Physician: Dr. 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  Patient called the nurses c/o weakness, dizziness and slurred speech. An inpatianet code stroke was called.  On exam, patient exhibited generalized weakness with increased weakness to her LUE, slightly slurred speech, slight left facial droop. CBG was WNL. Once in CT scanner, patient was able to lift her LUE and hold with no drift.  CT negative. CTA shows M2 stenosis, concern for narrowing at the intracranial stent in the right MCA . MRI showed small stroke in right hemisphere, likely related to this narrowing.   LKW: 1112 tnk given?: no, low NIH and rapidly progressing symptoms, on Brilinta.  Premorbid modified rankin scale: 1  NIHSS:        First exam       After CT 1a Level of Conscious.: 0   0 1b LOC Questions: 0    0  1c LOC Commands: 0   0 2 Best Gaze: 1    0  3 Visual: 0     0 4 Facial Palsy: 1    0 5a Motor Arm - left: 2    0 5b Motor Arm - Right:0    0 6a Motor Leg - Left: 1    0 6b Motor Leg - Right: 1   0  7 Limb Ataxia: 0    0 8 Sensory: 0     0 9 Best Language: 0    0 10 Dysarthria: 1    0 11 Extinct. and Inatten.: 1   0 TOTAL:           8    0   ROS: A 14 point ROS was performed and is negative except as noted in the HPI.    Past Medical History:  Diagnosis Date   Cataracts, bilateral    Cerebral infarction due to carotid artery stenosis (HCC)    Diabetes mellitus type 2 in nonobese (HCC)    Glaucoma    Hypertension    Hypertensive emergency 11/22/2022   Stroke (HCC)    no residual, "series of mini strokes"   Tobacco abuse    Vertigo     Family History  Problem Relation  Age of Onset   Dementia Mother    Kidney disease Father    Hypertension Father    Diabetes Father    Heart disease Father    Lung cancer Maternal Aunt        dx after 21   Prostate cancer Other        MGM's brother   Breast cancer Neg Hx    Colon cancer Neg Hx    Liver disease Neg Hx    Esophageal cancer Neg Hx    Social History:  reports that she has been smoking cigarettes. She has a 28 pack-year smoking history. She has never used smokeless tobacco. She reports that she does not drink alcohol and does not use drugs.  Exam: Current vital signs: BP 132/75 (BP Location: Left Arm)   Pulse 75   Temp 97.7 F (36.5 C) (Oral)   Resp 18   Ht 5' (1.524 m)   Wt 40.1 kg   SpO2 100%  BMI 17.26 kg/m  Vital signs in last 24 hours: Temp:  [97.7 F (36.5 C)-98 F (36.7 C)] 97.7 F (36.5 C) (10/14 1056) Pulse Rate:  [75-88] 75 (10/14 1056) Resp:  [18] 18 (10/14 0445) BP: (132-166)/(59-78) 132/75 (10/14 1056) SpO2:  [100 %] 100 % (10/14 1056) Weight:  [40.1 kg] 40.1 kg (10/14 0445)   Physical Exam  Appears well-developed and well-nourished.   Neuro: Mental Status: Patient is awake, alert, oriented to person, place, month, year, and situation. Patient is able to give a clear and coherent history. No signs of aphasia or neglect. Speech was dysarthric on first exam. On subsequent exams, patient's speech became clear.  Cranial Nerves: II: Visual Fields are full. Pupils are equal, round, and reactive to light.   III,IV, VI: EOMI without ptosis or diploplia.  V: Facial sensation is symmetric to temperature VII: Slight left facial droop on first exam . Symmetric on subsequent exams. VIII: hearing is intact to voice X: Uvula elevates symmetrically XI: Shoulder shrug is symmetric. XII: tongue is midline without atrophy or fasciculations.  Motor: Tone is normal.  LUE: 4-/5, drift present BLE: 4/5, drift present. 4+/5 strength was present in all four extremities, generalized  weakness present on subsequent examinations. Sensory: Sensation is symmetric to light touch and temperature in the arms and legs. Cerebellar: FNF and HKS are intact bilaterally  I have reviewed labs in epic and the results pertinent to this consultation are: K: 5.7 Cr: 1.39  I have reviewed the images obtained:  CT Head: No definite evidence of acute large vascular territory infarct or acute hemorrhage. Remote appearing right PCA territory infarct appears expanded compared to the prior. Remote left PCA territory infarct   CT Angio Head and Neck:  High-grade M2 stenoses just distal to the stent with asymmetric attenuation of right MCA branches. Overlapping stents extend from the right ICA terminus into a right M2 branch.  MRI Brain: pending official read, reviewed by Dr. Wilford Corner: small stroke in right hemisphere, likely related to in-stent narrowing.   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  Patient called the nurses c/o Left sided weakness, dizziness and slurred speech. An inpatient code stroke was called. On exam, patient was hypotensive. As the BP improved with [place] while in the CT scanner, her symptoms resolved.  In 2018, patient had an aneurysm coiling with Dr. Corliss Skains and was placed on Brilinta 90mg . From reviewing the chart notes, it looks like the patient changed that dose to 45mg  (unknown why). We discussed the case and the new assessment/imaging with Dr. Corliss Skains today. He recommends changing the Brilinta back to full strength for continued stroke prevention. MRI shows a small stroke. Due to the rapidly improving symptoms, there is no indication for IV or mechanical intervention at this time. Recommend stroke workup as below.  Stroke team will follow, starting tomorrow.   Recommendations: - Frequent Neuro checks per stroke unit protocol - Lipid panel - Statin - will be started if LDL>70 or otherwise medically indicated - A1C - Antithrombotic - Aspirin and Brilinta -  dsicussed with NIR Dr. Corliss Skains who has reviewed the case and recommends full dose Brilinta 90 mg twice daily instead of the lower dose or Plavix. - DVT ppx - lovenox -  Smoking cessation - will counsel patient - SBP goal - <220, PRN labetalol if HR>60 and PRN Hydralazine if HR<60  Avoid hypotension - Telemetry monitoring for arrhythmia - 72h - Swallow screen - will be performed prior to PO intake - Stroke education - will be given - PT/OT/SLP - Dispo: telemetry -Consider outpatient cardiac monitoring   Pt seen by Neuro NP/APP and later by MD. Note/plan to be edited by MD as needed.    Lynnae January, DNP, AGACNP-BC [facility] Please use AMION for contact information & EPIC for messaging.    Attending Neurohospitalist Addendum Patient seen and examined with APP/Resident. Agree with the history and physical as documented above. Agree with the plan as documented, which I helped formulate. I have independently reviewed the chart, obtained history, review of systems and examined the patient.I have personally reviewed pertinent head/neck/spine imaging (CT/MRI).  Patient seen as an acute code stroke.  Did have some left-sided deficits which rapidly improved on the CT table.  Question of significant narrowing in the stent in the right MCA likely related to medication noncompliance. Discussed with neurointerventional radiology-Dr. Corliss Skains who recommends aspirin and full dose Brilinta-90 mg twice daily as the antiplatelets for her intracranial stent. Small stroke seen on MRI which was done due to rapidly improving symptoms-again not a candidate for emergent IV thrombolysis or EVT due to minimal residual symptoms. Stroke team to  follow Plan was discussed with Dr. Sol Blazing of the internal medicine teaching service.  Please feel free to call with any questions.  -- Milon Dikes, MD Neurologist [facility] Pager: [phone]  CRITICAL CARE ATTESTATION Performed by: Milon Dikes, MD Total critical care time: 40 minutes Critical care time was exclusive of separately billable procedures and treating other patients and/or supervising APPs/Residents/Students Critical care was necessary to treat or prevent imminent or life-threatening deterioration. This patient is critically ill and at significant risk for neurological worsening and/or death and care requires constant monitoring. Critical care was time spent personally by me on the following activities: development of treatment plan with patient and/or surrogate as well as nursing, discussions with consultants, evaluation of patient's response to treatment, examination of patient, obtaining history from patient or surrogate, ordering and performing treatments and interventions, ordering and review of laboratory studies, ordering and review of radiographic studies, pulse oximetry, re-evaluation of patient's condition, participation in multidisciplinary rounds and medical decision making of high complexity in the care of this patient.

## 2023-09-15 NOTE — Consult Note (Signed)
[facility]  [facility] Inpatient Consult   09/15/2023  [name] 1955/12/12 [id number]  [facility] [[facility]]  Accountable Care Organization [ACO] Patient:  Amy Howell Advantage  Primary Care Provider: Rana Snare, DO With [facility] Internal Medicine this provider is listed for the transition of care follow up appointments and calls   [facility] Liaison reviewed patient for needs on unit rounds, patient on isolation.     The patient was screened for post hospital community follow up needs, patient is medium  risk score for unplanned readmission risk. Discussed with inpatient [facility] RN and chart reviewed.   The patient was assessed for potential [facility]) Care Management service needs for post hospital transition for care coordination. Review of patient's electronic medical record reveals patient is with ongoing medical management per progress notes.  Plan: [facility] Liaison will continue to follow progress and disposition to asess for post hospital community care coordination/management needs.  Referral request for community care coordination: pending disposition and community follow up needs. Anticipate follow up with provider [facility] team at current review for needs.   Community Care Management/Population Health does not replace or interfere with any arrangements made by the Inpatient Transition of Care team.   For questions contact:   Charlesetta Shanks, RN, BSN, CCM [place]  [facility], [facility] Liaison Direct Dial: [phone] or secure chat Website: [email or web address]

## 2023-09-15 NOTE — Progress Notes (Addendum)
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   Overnight: Was hyperkalemic at 5.7. EKG with not changes. Given 10mg  lokelma.   Subjective: Feels well. Had over 7 episodes of diarrhea last night. Some BRBPR. No abdominal Pain. No SOB.   Objective:   Vital signs in last 24 hours: Vitals:   09/15/23 1222 09/15/23 1235 09/15/23 1300 09/15/23 1420  BP: (!) 92/50 (!) 133/56 139/63 (!) 146/66  Pulse: 63 78  95  Resp:    18  Temp:    97.7 F (36.5 C)  TempSrc:    Oral  SpO2: 100% 100%  100%  Weight:      Height:       Supplemental O2: Room Air Last BM Date : 09/14/23 SpO2: 100 % FiO2 (%): 30 % Filed Weights   09/13/23 1353 09/14/23 0442 09/15/23 0445  Weight: 40.3 kg 39.3 kg 40.1 kg    Intake/Output Summary (Last 24 hours) at 09/15/2023 1601 Last data filed at 09/14/2023 2000 Gross per 24 hour  Intake 120 ml  Output --  Net 120 ml   Net IO Since Admission: -74.7 mL [09/15/23 1601]  Physical Exam General: NAD, cachetic appearance HENT: NCAT Lungs: lung sounds clear to auscultation bilaterally  Cardiovascular: RRR, no lower extremity edema, no JVD Abdomen: No TTP, bowel sounds present MSK: no asymmetry Skin: no lesions noted.  Neuro: alert and oriented x4 Psych: normal mood and normal affect  Diagnostics    Latest Ref Rng & Units 09/15/2023    3:32 AM 09/14/2023    7:52 AM 09/12/2023    5:26 PM  CBC  WBC 4.0 - 10.5 K/uL 6.8  8.2    Hemoglobin 12.0 - 15.0 g/dL 16.1  09.6  04.5   Hematocrit 36.0 - 46.0 % 38.0  38.7  40.0   Platelets 150 - 400 K/uL 182  181         Latest Ref Rng & Units 09/15/2023    3:32 AM 09/14/2023    3:52 AM 09/13/2023   12:59 PM  BMP  Glucose 70 - 99 mg/dL 79  73  409   BUN 8 - 23 mg/dL 17  15  16    Creatinine 0.44 - 1.00 mg/dL 8.11  9.14  7.82   Sodium 135 -  145 mmol/L 131  133  135   Potassium 3.5 - 5.1 mmol/L 5.7  4.6  3.1   Chloride 98 - 111 mmol/L 102  101  101   CO2 22 - 32 mmol/L 18  21  22    Calcium 8.9 - 10.3 mg/dL 9.1  8.6  8.2       Assessment/Plan: Principal Problem:   Acute on chronic HFrEF (heart failure with reduced ejection fraction) (HCC) Active Problems:   Hypertension   Lung nodule   Elevated serum creatinine   Diarrhea   Protein-calorie malnutrition, severe (HCC)   Metabolic acidosis   Underweight   Pleural effusion  #HFrEF 40-45% exacerbation #R>L Pleural Effusion #Moderate-large pleural effusion #Hx of Breast Cancer Pt declined thoracentesis yesterday. Scant fluid on the left and small volume fluid on the right. Pleural effusion could be malignant in nature given clinical presentation of cachexia and LAD, history of breast cancer and not currently taking anastrazole. Follow up US showed improving pericardial effusions without evidence of tamponade.  -Low sodium  diet -Strict I's and O's -Daily weights- standing only -Cardiology consulted; appreciate their recommendations -Would need FU with heart failure transition team to aid with medications  -Hold lasix today as patient is euvolemic.  -holding spironolactone 25mg  daily, jardiance 10mg  daily in setting of stroke- permissive HTN -Holding Coreg in the setting of most recent stroke -Not a candidate for Entresto given angioedema with ACE inhibitors   #Chronic Diarrhea Pt reported eating shrimp at a food truck and then developing diarrhea that has not gone away. On further interview she has loose yellow looking phlegm and very little stool. Has very decreased PO intake and stools are not large. Has no abdominal pain and is afebrile. Episodes are non-bloody. Stool studies gathered 10/13. -continue to trend BMP for GI electrolyte losses -fecal elastase and GI panel pending  #Hyperkalemia  K 5.7 today. No EKG changes. S/p Lokelma. K recheck today.   #PNA  Patchy  ground-glass opacities in the bilateral lower lobes and right middle lobe worrisome for infection on CTA.  Bilateral lower lobe and right middle lobe peribronchial wall thickening worrisome for bronchitis. Azithromycin started 10/13.  #HLD c/b CAD s/p stent in 11/2022 On lipitor 40 mg every day, brillinta 90 mg BID and aspirin 81 mg daily. Given today's stroke, recommendation to take 90 mg BID of Brillinta.  #Stroke R MCA Developed sudden onset left facial and arm weakness this afternoon. CT head was negative. CTA head and neck concerning for narrowing of the right MCA. MRI with 6 x 8 mm acute infarction in the subcortical white matter of the right posterior frontal lobe. No swelling or hemorrhage. Symptoms were rapidly improving, no TNK administered. Neuroendovascular would like her to be on ASA and Brillinta 90mg  BID. She is curerntly on 45mg  BID of Brillinta which would not be therapeutic for her.  Hgb A1c pending  Lipid Panel  Stroke team following, appreciate their involvement ASA  Brillinta 90mg  BID  #Elevated creatinine Cr 1.36, mildly increased from yesterday but still at baseline. Continue to monitor renal function as we use IV diuretics.    #HTN Was hypertensive this morning in the 160s, and coreg was incorporated again. Now holding in the setting of recent stroke.   #Lung Nodule Incidental finding on chest CT, 5mm right solid pulmonary nodule. As patient has history of cancer and long smoking history, non-con chest CT at 12 months is optional. Will need to FU with PCP.   #Glaucoma Chronic. Home eye drops Timolol and Latanoprost.  Diet: Heart Healthy IVF: None VTE: Lovenox Code: Full PT/OT recs: Pending Prior to Admission Living Arrangement: Home Anticipated Discharge Location: Home Barriers to Discharge: Medical Stability Dispo: Anticipated discharge in approximately 2 day(s).   Manuela Neptune, MD  Eligha Bridegroom. [facility] Internal Medicine Residency,  PGY-1  Pager: [phone] After 5 pm and on weekends: Please call the on-call pager

## 2023-09-15 NOTE — Evaluation (Signed)
Physical Therapy Evaluation Patient Details Name: Amy Howell MRN: [id number] DOB: [birth year] Today's Date: 09/15/2023  History of Present Illness  67 y.o. female [facility]23 with SOB and weight loss. Pt found to have bilateral effusions and large pericardial effusion. PMH includes HFrEF, HTN, HLD, CAD, breast CA.  Clinical Impression  Pt presents to PT without significant observable deficits at this time, mobilizing within the room independently. Pt ambulates for a short bout around the room but defers activity out of the room at this time, making endurance difficult to assess. Pt does not appear to have post-acute PT needs at this time. Pt is encouraged to mobilize frequently during admission in an effort to maintain her mobility.        If plan is discharge home, recommend the following:     Can travel by private vehicle        Equipment Recommendations None recommended by PT  Recommendations for Other Services       Functional Status Assessment Patient has not had a recent decline in their functional status     Precautions / Restrictions Precautions Precautions: None Restrictions Weight Bearing Restrictions: No      Mobility  Bed Mobility Overal bed mobility: Modified Independent                  Transfers Overall transfer level: Independent Equipment used: None                    Ambulation/Gait Ambulation/Gait assistance: Modified independent (Device/Increase time) Gait Distance (Feet): 50 Feet Assistive device: None Gait Pattern/deviations: Step-through pattern Gait velocity: functional Gait velocity interpretation: 1.31 - 2.62 ft/sec, indicative of limited community ambulator   General Gait Details: steady step-through gait, pt declines ambulation out of the room  Stairs            Wheelchair Mobility     Tilt Bed    Modified Rankin (Stroke Patients Only)       Balance Overall balance assessment:  Needs assistance Sitting-balance support: No upper extremity supported, Feet supported Sitting balance-Leahy Scale: Good     Standing balance support: No upper extremity supported, During functional activity Standing balance-Leahy Scale: Good                               Pertinent Vitals/Pain Pain Assessment Pain Assessment: No/denies pain    Home Living Family/patient expects to be discharged to:: Private residence Living Arrangements: Children Available Help at Discharge: Family;Available 24 hours/day Type of Home: Apartment Home Access: Level entry       Home Layout: One level Home Equipment: Tub bench;Cane - single point      Prior Function Prior Level of Function : Independent/Modified Independent             Mobility Comments: ambulatory without DME       Extremity/Trunk Assessment   Upper Extremity Assessment Upper Extremity Assessment: Overall WFL for tasks assessed    Lower Extremity Assessment Lower Extremity Assessment: Overall WFL for tasks assessed    Cervical / Trunk Assessment Cervical / Trunk Assessment: Normal  Communication   Communication Communication: No apparent difficulties Cueing Techniques: Verbal cues  Cognition Arousal: Alert Behavior During Therapy: WFL for tasks assessed/performed Overall Cognitive Status: Within Functional Limits for tasks assessed  General Comments General comments (skin integrity, edema, etc.): VSS on RA, BP 116/53    Exercises     Assessment/Plan    PT Assessment Patient needs continued PT services  PT Problem List Decreased balance;Cardiopulmonary status limiting activity       PT Treatment Interventions Gait training;Balance training;Neuromuscular re-education;Patient/family education    PT Goals (Current goals can be found in the Care Plan section)  Acute Rehab PT Goals Patient Stated Goal: to return home PT Goal  Formulation: With patient Time For Goal Achievement: 09/29/23 Potential to Achieve Goals: Good Additional Goals Additional Goal #1: Pt will score >19/24 on the DGI to indicate a reduced risk for falls Additional Goal #2: Pt will score >45/56 on the BERG to indicate a reduced risk for falls    Frequency Min 1X/week     Co-evaluation               AM-PAC PT "6 Clicks" Mobility  Outcome Measure Help needed turning from your back to your side while in a flat bed without using bedrails?: None Help needed moving from lying on your back to sitting on the side of a flat bed without using bedrails?: None Help needed moving to and from a bed to a chair (including a wheelchair)?: None Help needed standing up from a chair using your arms (e.g., wheelchair or bedside chair)?: None Help needed to walk in hospital room?: None Help needed climbing 3-5 steps with a railing? : A Little 6 Click Score: 23    End of Session   Activity Tolerance: Patient tolerated treatment well Patient left: in bed;with call bell/phone within reach Nurse Communication: Mobility status PT Visit Diagnosis: Other abnormalities of gait and mobility (R26.89)    Time: 0865-7846 PT Time Calculation (min) (ACUTE ONLY): 21 min   Charges:   PT Evaluation $PT Eval Low Complexity: 1 Low   PT General Charges $$ ACUTE PT VISIT: 1 Visit         Arlyss Gandy, PT, DPT Acute Rehabilitation Office [phone]   Arlyss Gandy 09/15/2023, 5:17 PM

## 2023-09-15 NOTE — Progress Notes (Signed)
Patient had a drop in BP after addition of Bidil but now BP stable at 146/10mmhg.  Will stop Bidil and if BP resmains stable will start low dose Hydralazine only tomorrow for GDMT for DCM

## 2023-09-15 NOTE — TOC Benefit Eligibility Note (Addendum)
Patient Product/process development scientist completed.    The patient is insured through [employer]. Patient has Medicare and is not eligible for a copay card, but may be able to apply for patient assistance, if available.    Ran test claim for isosorbide-hydralazine (Bidil) 20-37.5 mg  and the current 30 day co-pay is $47.00.  Ran test claim for Jaridance 10 mg  and the current 30 day co-pay is $47.00.  This test claim was processed through [facility]- copay amounts may vary at other pharmacies due to pharmacy/plan contracts, or as the patient moves through the different stages of their insurance plan.     Roland Earl, CPHT Pharmacy Technician III Certified Patient Advocate [facility] Pharm[facility] Patient Advocate Team Direct Number: [phone]  Fax: [phone]

## 2023-09-15 NOTE — Progress Notes (Addendum)
8.6* 9.1  PROT 6.9  --   --   --   --   ALBUMIN 3.3*  --   --   --  2.9*  AST 55*  --   --   --   --   ALT 67*  --   --   --   --   ALKPHOS 132*  --   --   --   --   BILITOT 0.9  --   --   --   --   GFRNONAA 48*  --  46* 42* 41*  ANIONGAP 13  --  12 11 11    < > = values in this interval not displayed.     Hematology Recent Labs  Lab 09/12/23 1249 09/12/23 1726 09/14/23 0752 09/15/23 0332  WBC 6.2  --  8.2 6.8  RBC 4.35  --  4.24 4.08  HGB 12.8 13.6 12.2 12.1  HCT 42.6 40.0 38.7 38.0  MCV 97.9  --  91.3 93.1  MCH 29.4  --  28.8 29.7  MCHC 30.0  --  31.5 31.8  RDW 14.6  --  14.7 14.6  PLT 195  --  181 182    BNP Recent Labs  Lab 09/12/23 1249  BNP 2,632.8*     DDimer No results for input(s): "DDIMER" in the last 168 hours.  Radiology    [place] CHEST (PLEURAL EFFUSION)  Result Date: 09/14/2023 CLINICAL DATA:  Patient admitted with acute heart failure exacerbation presents today with bilateral pleural effusions. EXAM: CHEST ULTRASOUND COMPARISON:  None Available. FINDINGS: Limited ultrasound examination of both lungs revealed  scant pleural fluid on the left side and small volume pleural fluid on the right side. Procedure risks and benefits were discussed with the patient and given the small volume of the pleural fluid on the right side the patient elected not to have procedure performed. Patient aware that the healthcare team had requested labs. Patient still refused. IMPRESSION: Small volume pleural fluid in the right lung. Patient refused procedure. No procedure performed. Ultrasound images obtained by Alwyn Ren NP. Electronically Signed   By: Marliss Coots M.D.   On: 09/14/2023 13:48   ECHOCARDIOGRAM LIMITED  Result Date: 09/14/2023    ECHOCARDIOGRAM LIMITED REPORT   Patient Name:   Amy Howell Date of Exam: 09/14/2023 Medical Rec #:  [id number]     Height:       60.0 in Accession #:    [id number]    Weight:       86.6 lb Date of Birth:  1956/06/16      BSA:          1.308 m Patient Age:    67 years      BP:           155/74 mmHg Patient Gender: F             HR:           72 bpm. Exam Location:  Inpatient Procedure: Limited Echo, Color Doppler and Cardiac Doppler Indications:    Pericardial Effusion  History:        Patient has prior history of Echocardiogram examinations, most                 recent 09/13/2023. CHF, Stroke; Risk Factors:Hypertension,                 Diabetes, Dyslipidemia and Former Smoker.  Sonographer:    Milbert Coulter Referring Phys: Maisie Fus IMPRESSIONS  8.6* 9.1  PROT 6.9  --   --   --   --   ALBUMIN 3.3*  --   --   --  2.9*  AST 55*  --   --   --   --   ALT 67*  --   --   --   --   ALKPHOS 132*  --   --   --   --   BILITOT 0.9  --   --   --   --   GFRNONAA 48*  --  46* 42* 41*  ANIONGAP 13  --  12 11 11    < > = values in this interval not displayed.     Hematology Recent Labs  Lab 09/12/23 1249 09/12/23 1726 09/14/23 0752 09/15/23 0332  WBC 6.2  --  8.2 6.8  RBC 4.35  --  4.24 4.08  HGB 12.8 13.6 12.2 12.1  HCT 42.6 40.0 38.7 38.0  MCV 97.9  --  91.3 93.1  MCH 29.4  --  28.8 29.7  MCHC 30.0  --  31.5 31.8  RDW 14.6  --  14.7 14.6  PLT 195  --  181 182    BNP Recent Labs  Lab 09/12/23 1249  BNP 2,632.8*     DDimer No results for input(s): "DDIMER" in the last 168 hours.  Radiology    [place] CHEST (PLEURAL EFFUSION)  Result Date: 09/14/2023 CLINICAL DATA:  Patient admitted with acute heart failure exacerbation presents today with bilateral pleural effusions. EXAM: CHEST ULTRASOUND COMPARISON:  None Available. FINDINGS: Limited ultrasound examination of both lungs revealed  scant pleural fluid on the left side and small volume pleural fluid on the right side. Procedure risks and benefits were discussed with the patient and given the small volume of the pleural fluid on the right side the patient elected not to have procedure performed. Patient aware that the healthcare team had requested labs. Patient still refused. IMPRESSION: Small volume pleural fluid in the right lung. Patient refused procedure. No procedure performed. Ultrasound images obtained by Alwyn Ren NP. Electronically Signed   By: Marliss Coots M.D.   On: 09/14/2023 13:48   ECHOCARDIOGRAM LIMITED  Result Date: 09/14/2023    ECHOCARDIOGRAM LIMITED REPORT   Patient Name:   Amy Howell Date of Exam: 09/14/2023 Medical Rec #:  [id number]     Height:       60.0 in Accession #:    [id number]    Weight:       86.6 lb Date of Birth:  1956/06/16      BSA:          1.308 m Patient Age:    67 years      BP:           155/74 mmHg Patient Gender: F             HR:           72 bpm. Exam Location:  Inpatient Procedure: Limited Echo, Color Doppler and Cardiac Doppler Indications:    Pericardial Effusion  History:        Patient has prior history of Echocardiogram examinations, most                 recent 09/13/2023. CHF, Stroke; Risk Factors:Hypertension,                 Diabetes, Dyslipidemia and Former Smoker.  Sonographer:    Milbert Coulter Referring Phys: Maisie Fus IMPRESSIONS  8.6* 9.1  PROT 6.9  --   --   --   --   ALBUMIN 3.3*  --   --   --  2.9*  AST 55*  --   --   --   --   ALT 67*  --   --   --   --   ALKPHOS 132*  --   --   --   --   BILITOT 0.9  --   --   --   --   GFRNONAA 48*  --  46* 42* 41*  ANIONGAP 13  --  12 11 11    < > = values in this interval not displayed.     Hematology Recent Labs  Lab 09/12/23 1249 09/12/23 1726 09/14/23 0752 09/15/23 0332  WBC 6.2  --  8.2 6.8  RBC 4.35  --  4.24 4.08  HGB 12.8 13.6 12.2 12.1  HCT 42.6 40.0 38.7 38.0  MCV 97.9  --  91.3 93.1  MCH 29.4  --  28.8 29.7  MCHC 30.0  --  31.5 31.8  RDW 14.6  --  14.7 14.6  PLT 195  --  181 182    BNP Recent Labs  Lab 09/12/23 1249  BNP 2,632.8*     DDimer No results for input(s): "DDIMER" in the last 168 hours.  Radiology    [place] CHEST (PLEURAL EFFUSION)  Result Date: 09/14/2023 CLINICAL DATA:  Patient admitted with acute heart failure exacerbation presents today with bilateral pleural effusions. EXAM: CHEST ULTRASOUND COMPARISON:  None Available. FINDINGS: Limited ultrasound examination of both lungs revealed  scant pleural fluid on the left side and small volume pleural fluid on the right side. Procedure risks and benefits were discussed with the patient and given the small volume of the pleural fluid on the right side the patient elected not to have procedure performed. Patient aware that the healthcare team had requested labs. Patient still refused. IMPRESSION: Small volume pleural fluid in the right lung. Patient refused procedure. No procedure performed. Ultrasound images obtained by Alwyn Ren NP. Electronically Signed   By: Marliss Coots M.D.   On: 09/14/2023 13:48   ECHOCARDIOGRAM LIMITED  Result Date: 09/14/2023    ECHOCARDIOGRAM LIMITED REPORT   Patient Name:   Amy Howell Date of Exam: 09/14/2023 Medical Rec #:  [id number]     Height:       60.0 in Accession #:    [id number]    Weight:       86.6 lb Date of Birth:  1956/06/16      BSA:          1.308 m Patient Age:    67 years      BP:           155/74 mmHg Patient Gender: F             HR:           72 bpm. Exam Location:  Inpatient Procedure: Limited Echo, Color Doppler and Cardiac Doppler Indications:    Pericardial Effusion  History:        Patient has prior history of Echocardiogram examinations, most                 recent 09/13/2023. CHF, Stroke; Risk Factors:Hypertension,                 Diabetes, Dyslipidemia and Former Smoker.  Sonographer:    Milbert Coulter Referring Phys: Maisie Fus IMPRESSIONS  8.6* 9.1  PROT 6.9  --   --   --   --   ALBUMIN 3.3*  --   --   --  2.9*  AST 55*  --   --   --   --   ALT 67*  --   --   --   --   ALKPHOS 132*  --   --   --   --   BILITOT 0.9  --   --   --   --   GFRNONAA 48*  --  46* 42* 41*  ANIONGAP 13  --  12 11 11    < > = values in this interval not displayed.     Hematology Recent Labs  Lab 09/12/23 1249 09/12/23 1726 09/14/23 0752 09/15/23 0332  WBC 6.2  --  8.2 6.8  RBC 4.35  --  4.24 4.08  HGB 12.8 13.6 12.2 12.1  HCT 42.6 40.0 38.7 38.0  MCV 97.9  --  91.3 93.1  MCH 29.4  --  28.8 29.7  MCHC 30.0  --  31.5 31.8  RDW 14.6  --  14.7 14.6  PLT 195  --  181 182    BNP Recent Labs  Lab 09/12/23 1249  BNP 2,632.8*     DDimer No results for input(s): "DDIMER" in the last 168 hours.  Radiology    [place] CHEST (PLEURAL EFFUSION)  Result Date: 09/14/2023 CLINICAL DATA:  Patient admitted with acute heart failure exacerbation presents today with bilateral pleural effusions. EXAM: CHEST ULTRASOUND COMPARISON:  None Available. FINDINGS: Limited ultrasound examination of both lungs revealed  scant pleural fluid on the left side and small volume pleural fluid on the right side. Procedure risks and benefits were discussed with the patient and given the small volume of the pleural fluid on the right side the patient elected not to have procedure performed. Patient aware that the healthcare team had requested labs. Patient still refused. IMPRESSION: Small volume pleural fluid in the right lung. Patient refused procedure. No procedure performed. Ultrasound images obtained by Alwyn Ren NP. Electronically Signed   By: Marliss Coots M.D.   On: 09/14/2023 13:48   ECHOCARDIOGRAM LIMITED  Result Date: 09/14/2023    ECHOCARDIOGRAM LIMITED REPORT   Patient Name:   Amy Howell Date of Exam: 09/14/2023 Medical Rec #:  [id number]     Height:       60.0 in Accession #:    [id number]    Weight:       86.6 lb Date of Birth:  1956/06/16      BSA:          1.308 m Patient Age:    67 years      BP:           155/74 mmHg Patient Gender: F             HR:           72 bpm. Exam Location:  Inpatient Procedure: Limited Echo, Color Doppler and Cardiac Doppler Indications:    Pericardial Effusion  History:        Patient has prior history of Echocardiogram examinations, most                 recent 09/13/2023. CHF, Stroke; Risk Factors:Hypertension,                 Diabetes, Dyslipidemia and Former Smoker.  Sonographer:    Milbert Coulter Referring Phys: Maisie Fus IMPRESSIONS  1. Left ventricular ejection fraction, by estimation, is 30 to 35%. The left ventricle has moderately decreased function. There is severe left ventricular hypertrophy.  2. Right ventricular systolic function is mildly reduced. The right ventricular size is normal.  3. Moderate pericardial effusion, improved from prior study. . There is no evidence of cardiac tamponade.  4. Mild to moderate mitral valve regurgitation.  5. The inferior vena cava is normal in size with greater than 50% respiratory variability, suggesting right atrial pressure of 3  mmHg. Conclusion(s)/Recommendation(s): Pericardial effusion has improved. still no cardiac tamponade. FINDINGS  Left Ventricle: Left ventricular ejection fraction, by estimation, is 30 to 35%. The left ventricle has moderately decreased function. There is severe left ventricular hypertrophy. Right Ventricle: The right ventricular size is normal. Right ventricular systolic function is mildly reduced. Pericardium: Moderate pericardial effusion, improved from prior study. There is no evidence of cardiac tamponade. Mitral Valve: Mild to moderate mitral valve regurgitation. Tricuspid Valve: Tricuspid valve regurgitation is not demonstrated. Venous: The inferior vena cava is normal in size with greater than 50% respiratory variability, suggesting right atrial pressure of 3 mmHg. Additional Comments: Spectral Doppler performed. Color Doppler performed.  Carolan Clines Electronically signed by Carolan Clines Signature Date/Time: 09/14/2023/11:51:06 AM    Final    ECHOCARDIOGRAM COMPLETE  Result Date: 09/13/2023    ECHOCARDIOGRAM REPORT   Patient Name:   Amy Howell Date of Exam: 09/13/2023 Medical Rec #:  [id number]     Height:       60.0 in Accession #:    [id number]    Weight:       90.0 lb Date of Birth:  1956-10-26      BSA:          1.329 m Patient Age:    67 years      BP:           167/69 mmHg Patient Gender: F             HR:           100 bpm. Exam Location:  Inpatient Procedure: 2D Echo, Color Doppler and Cardiac Doppler Indications:    CHF  History:        Patient has prior history of Echocardiogram examinations, most                 recent 02/03/2023. CHF, Breast Cancer and Stroke; Risk                 Factors:Hypertension, Diabetes, Dyslipidemia and Former Smoker.  Sonographer:    Milbert Coulter Referring Phys: [id number] CAROLYN GUILLOUD IMPRESSIONS  1. Moderate-large pericardial effusion, primarily over RV, with epicardial fat. IVC is grossly normal. There is no RV diastolic collapse, but there may be subtle  decreased diastolic expansion of the RV. No significant respiratory variability of the septum. No respiratory variability of mitral inflow signal. BP on echo 169/69, HR 100. Finding in combination do not suggest tamponade, however given size of effusion, would consider close follow up exam within 2-3 days to ensure no change. Moderate-large effusion.  2. Left ventricular ejection fraction, by estimation, is 30 to 35%. The left ventricle has moderately decreased function. The left ventricle demonstrates global hypokinesis. There is mild left ventricular hypertrophy. Indeterminate diastolic filling due  to E-A fusion.  3. Right ventricular systolic function is mildly reduced. The right ventricular size is normal. Tricuspid regurgitation signal is inadequate for assessing PA pressure.  4. Left atrial size was mildly dilated.  5. The  8.6* 9.1  PROT 6.9  --   --   --   --   ALBUMIN 3.3*  --   --   --  2.9*  AST 55*  --   --   --   --   ALT 67*  --   --   --   --   ALKPHOS 132*  --   --   --   --   BILITOT 0.9  --   --   --   --   GFRNONAA 48*  --  46* 42* 41*  ANIONGAP 13  --  12 11 11    < > = values in this interval not displayed.     Hematology Recent Labs  Lab 09/12/23 1249 09/12/23 1726 09/14/23 0752 09/15/23 0332  WBC 6.2  --  8.2 6.8  RBC 4.35  --  4.24 4.08  HGB 12.8 13.6 12.2 12.1  HCT 42.6 40.0 38.7 38.0  MCV 97.9  --  91.3 93.1  MCH 29.4  --  28.8 29.7  MCHC 30.0  --  31.5 31.8  RDW 14.6  --  14.7 14.6  PLT 195  --  181 182    BNP Recent Labs  Lab 09/12/23 1249  BNP 2,632.8*     DDimer No results for input(s): "DDIMER" in the last 168 hours.  Radiology    [place] CHEST (PLEURAL EFFUSION)  Result Date: 09/14/2023 CLINICAL DATA:  Patient admitted with acute heart failure exacerbation presents today with bilateral pleural effusions. EXAM: CHEST ULTRASOUND COMPARISON:  None Available. FINDINGS: Limited ultrasound examination of both lungs revealed  scant pleural fluid on the left side and small volume pleural fluid on the right side. Procedure risks and benefits were discussed with the patient and given the small volume of the pleural fluid on the right side the patient elected not to have procedure performed. Patient aware that the healthcare team had requested labs. Patient still refused. IMPRESSION: Small volume pleural fluid in the right lung. Patient refused procedure. No procedure performed. Ultrasound images obtained by Alwyn Ren NP. Electronically Signed   By: Marliss Coots M.D.   On: 09/14/2023 13:48   ECHOCARDIOGRAM LIMITED  Result Date: 09/14/2023    ECHOCARDIOGRAM LIMITED REPORT   Patient Name:   Amy Howell Date of Exam: 09/14/2023 Medical Rec #:  [id number]     Height:       60.0 in Accession #:    [id number]    Weight:       86.6 lb Date of Birth:  1956/06/16      BSA:          1.308 m Patient Age:    67 years      BP:           155/74 mmHg Patient Gender: F             HR:           72 bpm. Exam Location:  Inpatient Procedure: Limited Echo, Color Doppler and Cardiac Doppler Indications:    Pericardial Effusion  History:        Patient has prior history of Echocardiogram examinations, most                 recent 09/13/2023. CHF, Stroke; Risk Factors:Hypertension,                 Diabetes, Dyslipidemia and Former Smoker.  Sonographer:    Milbert Coulter Referring Phys: Maisie Fus IMPRESSIONS

## 2023-09-15 NOTE — Progress Notes (Signed)
   Heart Failure Stewardship Pharmacist Progress Note   PCP: Rana Snare, DO PCP-Cardiologist: Maisie Fus, MD    HPI:  67 yo F with PMH of CHF, HTN, T2DM, brain aneurysm with stent placed, CVA, CAD/PCI, breast cancer s/p R mastectomy.   Admitted 11/2022 with increased shortness of breath in the setting of acute HFrEF. Cath showed 75% stenosis of mid RCA s/p PCI with DES. EF 30-35%. Hospitalization complicated by AKI. She was started on losartan, carvedilol, and spironolactone. Later, the losartan was stopped due to headache.   ECHO 01/2023 with EF 40-45%.   Presented to the ED on 10/11 with shortness of breath and diarrhea. Also complaining of orthopnea and unintentional weight loss. CXR with enlarged heart and vascular congestion with interstitial edema. BNP 2632. CTA negative for PE. ECHO 10/12 showed LVEF 30-35%, moderate-large pericardial effusion (no evidence of tamponade on repeat ECHO 10/13), mild-moderate MR. Refused thoracentesis on 10/13.   Current HF Medications: Beta Blocker: carvedilol 6.25 mg BID MRA: spironolactone 25 mg daily SGLT2i: Jardiance 10 mg daily Other: BiDil 20/37.5 mg 1 tab TID  Prior to admission HF Medications: Beta blocker: carvedilol 6.25 mg BID MRA: spironolactone 25 mg daily *also on hydrochlorothiazide 12.5 mg daily  Pertinent Lab Values: Serum creatinine 1.39, BUN 17, Potassium 5.7, Sodium 131, BNP 2632.8, Magnesium 1.6   Vital Signs: Weight: 88 lbs (admission weight: 90 lbs) Blood pressure: 130/60s  Heart rate: 60-80s  I/O: net +0.1L yesterday; net -70mL since admission  Medication Assistance / Insurance Benefits Check: Does the patient have prescription insurance?  Yes Type of insurance plan: [employer]  Outpatient Pharmacy:  Prior to admission outpatient pharmacy: [facility] OP Is the patient willing to use [facility] pharmacy at discharge? Yes    Assessment: 1. Acute on chronic systolic CHF (LVEF 30-35%). NYHA class II symptoms. -  Off IV lasix, volume improved. Strict I/Os and daily weights. Keep K>4 and Mg>2. Lokelma given today with hyperkalemia.  - Continue carvedilol 6.25 mg BID. History of only taking it once daily instead of BID. May need to transition to metoprolol XL.  - History of angioedema on ACE-I. Intolerant to losartan 2/2 headaches.  - K 5.7 today s/p reversal with Lokelma. Was given spironolactone 25 mg daily today. Would need to hold if potassium not normalized in the AM.  - Continue Jardiance 10 mg daily - Consider discontinuing hydrochlorothiazide on discharge  Plan: 1) Medication changes recommended at this time: - may need to adjust carvedilol to metoprolol XL for compliance concern - monitor K in AM and may need to hold spironolactone - discontinue hydrochlorothiazide on discharge  2) Patient assistance: - BiDil copay $47 - Jardiance copay $47  3)  Education  - To be completed prior to discharge  Sharen Hones, PharmD, BCPS Heart Failure Stewardship Pharmacist Phone [phone]

## 2023-09-15 NOTE — Code Documentation (Signed)
Stroke Response Nurse Documentation Code Documentation  Makaylah Oddo is a 67 y.o. female admitted to [facility]  on 09-12-2023 for shortness of breath with past medical hx of CHF, HTN, CA, Aneurysm coiled. On aspirin 81 mg daily and Brilinta (ticagrelor) 90 mg bid. Code stroke was activated by bedside RN .   Patient on 3E progressive care unit where she was LKW at 1112 and now complaining of dizziness, slurred speech, Left side weakness.  She was her normal self when Rn gave her medication at 1112.  About 1150 she called out stating she felt bad.  BP was checked at that time and she was hypotensive.    Stroke team at the bedside after patient activation. Patient to CT with team. NIHSS 8, see documentation for details and code stroke times. Patient with right gaze preference , left facial droop, left arm weakness, bilateral leg weakness, dysarthria , and left neglect on exam. The following imaging was completed:  CT Head, CTA, and MRI. Patient is not a candidate for IV Thrombolytic due to taking Brilinta. Patient is not a candidate for IR due to no LVO.   Care/Plan: VS and NIHSS q 2 hour x 12 hours. [facility] swallow screen.  Bedside handoff with RN Salome.    Marcellina Millin  Stroke Response RN

## 2023-09-15 NOTE — Plan of Care (Signed)
  Problem: Education: Goal: Understanding of CV disease, CV risk reduction, and recovery process will improve Outcome: Progressing   Problem: Activity: Goal: Ability to return to baseline activity level will improve Outcome: Progressing   Problem: Cardiovascular: Goal: Ability to achieve and maintain adequate cardiovascular perfusion will improve Outcome: Progressing   Problem: Cardiovascular: Goal: Vascular access site(s) Level 0-1 will be maintained Outcome: Progressing   Problem: Health Behavior/Discharge Planning: Goal: Ability to safely manage health-related needs after discharge will improve Outcome: Progressing   Problem: Clinical Measurements: Goal: Ability to maintain clinical measurements within normal limits will improve Outcome: Progressing

## 2023-09-15 NOTE — Progress Notes (Addendum)
Patient has transfer orders to 3W. Attempted to call report to receiving RN, but she is busy, left call back number with the unit secretary. Elnita Maxwell, RN  [phone]: Patient report given to receiving unit, room being cleaned. Elnita Maxwell, RN [phone]: Transferred to 3W room 34. Elnita Maxwell, RN

## 2023-09-16 DIAGNOSIS — I5023 Acute on chronic systolic (congestive) heart failure: Secondary | ICD-10-CM | POA: Diagnosis not present

## 2023-09-16 DIAGNOSIS — I3139 Other pericardial effusion (noninflammatory): Secondary | ICD-10-CM | POA: Diagnosis not present

## 2023-09-16 DIAGNOSIS — I63511 Cerebral infarction due to unspecified occlusion or stenosis of right middle cerebral artery: Secondary | ICD-10-CM

## 2023-09-16 DIAGNOSIS — I420 Dilated cardiomyopathy: Secondary | ICD-10-CM | POA: Diagnosis not present

## 2023-09-16 DIAGNOSIS — I639 Cerebral infarction, unspecified: Secondary | ICD-10-CM | POA: Diagnosis not present

## 2023-09-16 DIAGNOSIS — J90 Pleural effusion, not elsewhere classified: Secondary | ICD-10-CM | POA: Diagnosis not present

## 2023-09-16 DIAGNOSIS — Z853 Personal history of malignant neoplasm of breast: Secondary | ICD-10-CM | POA: Diagnosis not present

## 2023-09-16 LAB — PANCREATIC ELASTASE, FECAL: Pancreatic Elastase-1, Stool: >800 ug Elast./g (ref >200)

## 2023-09-16 LAB — CBC WITH DIFFERENTIAL/PLATELET
Abs Immature Granulocytes: 0.02 10*3/uL (ref 0.00–0.07)
Basophils Absolute: 0 10*3/uL (ref 0.0–0.1)
Basophils Relative: 0 %
Eosinophils Absolute: 0.3 10*3/uL (ref 0.0–0.5)
Eosinophils Relative: 5 %
HCT: 36.2 % (ref 36.0–46.0)
Hemoglobin: 11.5 g/dL — ABNORMAL LOW (ref 12.0–15.0)
Immature Granulocytes: 0 %
Lymphocytes Relative: 11 %
Lymphs Abs: 0.7 10*3/uL (ref 0.7–4.0)
MCH: 29 pg (ref 26.0–34.0)
MCHC: 31.8 g/dL (ref 30.0–36.0)
MCV: 91.4 fL (ref 80.0–100.0)
Monocytes Absolute: 0.6 10*3/uL (ref 0.1–1.0)
Monocytes Relative: 9 %
Neutro Abs: 4.7 10*3/uL (ref 1.7–7.7)
Neutrophils Relative %: 75 %
Platelets: 202 10*3/uL (ref 150–400)
RBC: 3.96 MIL/uL (ref 3.87–5.11)
RDW: 14.6 % (ref 11.5–15.5)
WBC: 6.2 10*3/uL (ref 4.0–10.5)
nRBC: 0 % (ref 0.0–0.2)

## 2023-09-16 LAB — BASIC METABOLIC PANEL
Anion gap: 15 (ref 5–15)
BUN: 18 mg/dL (ref 8–23)
CO2: 17 mmol/L — ABNORMAL LOW (ref 22–32)
Calcium: 9.4 mg/dL (ref 8.9–10.3)
Chloride: 100 mmol/L (ref 98–111)
Creatinine, Ser: 1.8 mg/dL — ABNORMAL HIGH (ref 0.44–1.00)
GFR, Estimated: 30 mL/min — ABNORMAL LOW (ref >60)
Glucose, Bld: 69 mg/dL — ABNORMAL LOW (ref 70–99)
Potassium: 4.5 mmol/L (ref 3.5–5.1)
Sodium: 132 mmol/L — ABNORMAL LOW (ref 135–145)

## 2023-09-16 LAB — LIPID PANEL
Cholesterol: 141 mg/dL (ref 0–200)
HDL: 32 mg/dL — ABNORMAL LOW (ref >40)
LDL Cholesterol: 91 mg/dL (ref 0–99)
Total CHOL/HDL Ratio: 4.4 {ratio}
Triglycerides: 90 mg/dL (ref <150)
VLDL: 18 mg/dL (ref 0–40)

## 2023-09-16 LAB — HEMOGLOBIN A1C
Hgb A1c MFr Bld: 5.6 % (ref 4.8–5.6)
Mean Plasma Glucose: 114.02 mg/dL

## 2023-09-16 MED ORDER — ATORVASTATIN CALCIUM 80 MG PO TABS: 80.0000 mg | ORAL_TABLET | Freq: Every day | ORAL | Status: DC

## 2023-09-16 MED ORDER — ATORVASTATIN CALCIUM 40 MG PO TABS
40.0000 mg | ORAL_TABLET | Freq: Every day | ORAL | Status: DC
  Administered 2023-09-18: 40 mg via ORAL
  Filled 2023-09-16: qty 1

## 2023-09-16 NOTE — Progress Notes (Addendum)
Progress Note  Patient Name: Amy Howell Date of Encounter: 09/16/2023  [facility] Cardiologist: Maisie Fus, MD   Subjective   Patient has a history of breast cancer status post right mastectomy but did not receive radiation or chemo.  Now presenting with shortness of breath x 1 month with weight loss and a BNP greater than 2000 with bilateral pleural effusions and large pericardial effusion with no evidence of tamponade.  EF 30 to 35%.  Had stroke like sx yesterday and code stroke called.  MRI showed new acute CVA in right hemisphere related to in-stent narrowing of the right MCA noted on CT angio.  She has been transitioned to Brilinta 90mg  BID.  Symptoms of LUE weakness and facial droop with slurred speech have resolved.  Inpatient Medications    Scheduled Meds:  aspirin  81 mg Oral Daily   atorvastatin  80 mg Oral Daily   azithromycin  250 mg Oral Daily   dorzolamide  1 drop Both Eyes BID   feeding supplement  237 mL Oral Q24H   latanoprost  1 drop Both Eyes QHS   ticagrelor  90 mg Oral BID   timolol  1 drop Both Eyes q morning   Continuous Infusions:  PRN Meds: acetaminophen, alum & mag hydroxide-simeth, prochlorperazine   Vital Signs    Vitals:   09/15/23 1840 09/15/23 2014 09/15/23 2358 09/16/23 0417  BP: (!) 148/64 (!) 96/50 (!) 103/52 130/68  Pulse: 79 78 79 81  Resp: 18 16 16 16   Temp: 97.8 F (36.6 C) 98 F (36.7 C) 98 F (36.7 C) 98.6 F (37 C)  TempSrc: Oral   Oral  SpO2: 100% 100% 100% 99%  Weight:      Height:        Intake/Output Summary (Last 24 hours) at 09/16/2023 0754 Last data filed at 09/16/2023 0600 Gross per 24 hour  Intake 240 ml  Output --  Net 240 ml      09/15/2023    4:45 AM 09/14/2023    4:42 AM 09/13/2023    1:53 PM  Last 3 Weights  Weight (lbs) 88 lb 6.4 oz 86 lb 10.3 oz 88 lb 13.5 oz  Weight (kg) 40.098 kg 39.3 kg 40.3 kg      Telemetry    NSR- Personally Reviewed  ECG    No new EKG to review-  Personally Reviewed  Physical Exam   GEN: Well nourished, well developed in no acute distress HEENT: Normal NECK: No JVD; No carotid bruits LYMPHATICS: No lymphadenopathy CARDIAC:RRR, no murmurs, rubs, gallops RESPIRATORY:  Clear to auscultation without rales, wheezing or rhonchi  ABDOMEN: Soft, non-tender, non-distended MUSCULOSKELETAL:  No edema; No deformity  SKIN: Warm and dry NEUROLOGIC:  Alert and oriented x 3 PSYCHIATRIC:  Normal affect  Labs    High Sensitivity Troponin:   Recent Labs  Lab 09/12/23 1249 09/12/23 1629 09/13/23 0759 09/13/23 1259  TROPONINIHS 7 14 22* 18*      Chemistry Recent Labs  Lab 09/12/23 1249 09/12/23 1726 09/14/23 0352 09/15/23 0332 09/15/23 1545 09/16/23 0451  NA 137   < > 133* 131*  --  132*  K 4.3   < > 4.6 5.7* 4.7 4.5  CL 106   < > 101 102  --  100  CO2 18*   < > 21* 18*  --  17*  GLUCOSE 111*   < > 73 79  --  69*  BUN 22   < > 15  17  --  18  CREATININE 1.22*   < > 1.36* 1.39*  --  1.80*  CALCIUM 9.1   < > 8.6* 9.1  --  9.4  PROT 6.9  --   --   --   --   --   ALBUMIN 3.3*  --   --  2.9*  --   --   AST 55*  --   --   --   --   --   ALT 67*  --   --   --   --   --   ALKPHOS 132*  --   --   --   --   --   BILITOT 0.9  --   --   --   --   --   GFRNONAA 48*   < > 42* 41*  --  30*  ANIONGAP 13   < > 11 11  --  15   < > = values in this interval not displayed.     Hematology Recent Labs  Lab 09/14/23 0752 09/15/23 0332 09/16/23 0451  WBC 8.2 6.8 6.2  RBC 4.24 4.08 3.96  HGB 12.2 12.1 11.5*  HCT 38.7 38.0 36.2  MCV 91.3 93.1 91.4  MCH 28.8 29.7 29.0  MCHC 31.5 31.8 31.8  RDW 14.7 14.6 14.6  PLT 181 182 202    BNP Recent Labs  Lab 09/12/23 1249  BNP 2,632.8*     DDimer No results for input(s): "DDIMER" in the last 168 hours.  Radiology    MR BRAIN WO CONTRAST  Result Date: 09/15/2023 CLINICAL DATA:  Neuro deficit, acute, stroke suspected. EXAM: MRI HEAD WITHOUT CONTRAST TECHNIQUE: Multiplanar, multiecho  pulse sequences of the brain and surrounding structures were obtained without intravenous contrast. COMPARISON:  CT studies same day.  MRI 03/27/2017. FINDINGS: Brain: Diffusion imaging shows an acute 6 x 8 mm infarction in the subcortical white matter of the right posterior frontal lobe. No other acute insult. Chronic small-vessel ischemic changes affect the pons. 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 Old bilateral thalamic lacunar infarctions. Few old small vessel infarctions of the basal ganglia. Widespread chronic small-vessel ischemic changes of the cerebral hemispheric deep and subcortical white matter. No mass, hemorrhage, hydrocephalus or extra-axial collection. Few scattered punctate foci of hemosiderin deposition related to old small vessel infarctions. Vascular: Major vessels at the base of the brain show flow. Skull and upper cervical spine: Negative Sinuses/Orbits: Clear/normal Other: None IMPRESSION: 1. 6 x 8 mm acute infarction in the subcortical white matter of the right posterior frontal lobe. No swelling or hemorrhage. 2. Extensive chronic small-vessel ischemic changes elsewhere throughout the brain as outlined above. 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 Electronically Signed   By: Paulina Fusi M.D.   On: 09/15/2023 16:09   CT ANGIO HEAD NECK W WO CM (CODE STROKE)  Result Date: 09/15/2023 CLINICAL DATA:  Code stroke.  Neuro deficit, acute stroke suspected. EXAM: CT ANGIOGRAPHY HEAD AND NECK WITH AND WITHOUT CONTRAST TECHNIQUE: Multidetector CT imaging of the head and neck was performed using the standard protocol during bolus administration of intravenous contrast. Multiplanar CT image reconstructions and MIPs were obtained to evaluate the vascular anatomy. Carotid stenosis measurements (when applicable) are obtained utilizing  NASCET criteria, using the distal internal carotid diameter as the denominator. RADIATION DOSE REDUCTION: This exam was performed according to the departmental dose-optimization  program which includes automated exposure control, adjustment of the mA and/or kV according to patient size and/or use of iterative reconstruction technique. CONTRAST:  75mL OMNIPAQUE IOHEXOL 350 MG/ML SOLN COMPARISON:  CT head without contrast 09/15/2023 FINDINGS: CTA NECK FINDINGS Aortic arch: Extensive atherosclerotic changes are present at the aortic arch. The great vessel origins are not imaged. No focal stenosis or aneurysm is visualized. Right carotid system: Mild wall irregularity is present in the distal right common carotid artery without significant stenosis. Atherosclerotic calcifications are present at the right carotid bifurcation without significant stenosis. The cervical right ICA is otherwise normal. Left carotid system: The left common carotid artery is within normal limits. Minimal atherosclerotic calcifications are present at the bifurcation without significant stenosis. The cervical left ICA is normal. Vertebral arteries: The left vertebral artery is the dominant vessel. Both vertebral arteries originate from the subclavian arteries without significant stenosis. No significant stenosis is present in either vertebral artery in the neck. Skeleton: Mild degenerative changes are present most evident at C5-6. Vertebral body heights are maintained. No focal osseous lesions are present. Other neck: Soft tissues the neck are otherwise unremarkable. Salivary glands are within normal limits. Thyroid is normal. No significant adenopathy is present. No focal mucosal or submucosal lesions are present. Upper chest: Centrilobular emphysematous changes are present. No nodule or mass lesion is present. The thoracic inlet is within normal limits. Review of the MIP images confirms the above findings CTA HEAD FINDINGS Anterior circulation:  Atherosclerotic calcifications are present within the cavernous internal carotid arteries bilaterally without significant stenosis. The left ICA terminus is within normal limits. Overlapping stents extend from the right ICA terminus into a right M2 branch. High-grade M2 stenoses are present just distal to the stent with asymmetric attenuation of right MCA branches. No residual aneurysm is present. Distal segmental narrowing is present in the ACA branch vessels and right MCA branch vessels. No other significant proximal stenosis or occlusion is present. Posterior circulation: The left vertebral artery is dominant vessel. The PICA origins are visualized and normal. Atherosclerotic changes are present at the vertebrobasilar junction with a high-grade proximal basilar artery stenosis. The more distal basilar artery is attenuated. Both posterior cerebral arteries originate from basilar tip. High-grade stenosis is present in proximal right P1 segment. Moderate narrowing is present in the inferior P3 segments bilaterally. Venous sinuses: The dural sinuses are patent. The straight sinus and deep cerebral veins are intact. Cortical veins are within normal limits. No significant vascular malformation is evident. Anatomic variants: None Review of the MIP images confirms the above findings IMPRESSION: 1. Overlapping stents extend from the right ICA terminus into a right M2 branch. 2. High-grade M2 stenoses just distal to the stent with asymmetric attenuation of right MCA branches. This may result in decreased cerebral blood flow to portions of the right MCA territory. 3. No residual aneurysm. 4. Distal segmental narrowing of the ACA branch vessels and right MCA branch vessels. 5. 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 6. High-grade stenosis of the proximal right P1 segment. 7. Moderate narrowing of the inferior P3 segments bilaterally. 8. Aortic Atherosclerosis (ICD10-I70.0) and Emphysema (ICD10-J43.9). These  results were called by telephone at the time of interpretation on 09/15/2023 at 1:00 pm to provider [facility] , who verbally acknowledged these results. Electronically Signed   By: Marin Roberts M.D.   On: 09/15/2023 13:00   CT HEAD CODE STROKE WO CONTRAST`  Result Date: 09/15/2023 CLINICAL DATA:  Code stroke.  Mental status change, unknown cause  EXAM: CT HEAD WITHOUT CONTRAST TECHNIQUE: Contiguous axial images were obtained from the base of the skull through the vertex without intravenous contrast. RADIATION DOSE REDUCTION: This exam was performed according to the departmental dose-optimization program which includes automated exposure control, adjustment of the mA and/or kV according to patient size and/or use of iterative reconstruction technique. COMPARISON:  None Available. FINDINGS: Brain: No evidence of acute large vascular territory infarction, hemorrhage, hydrocephalus, extra-axial collection or mass lesion/mass effect. Remote appearing right PCA territory infarct is expanded compared to the prior. Remote left cerebellar infarct. Vascular: Right MCA stent. Skull: No acute fracture. Sinuses/Orbits: Clear sinuses.  No acute orbital findings. ASPECTS [facility] Stroke Program Early CT Score) total score (0-10 with 10 being normal): 10. IMPRESSION: 1. No definite evidence of acute large vascular territory infarct or acute hemorrhage. 2. Remote appearing right PCA territory infarct appears expanded compared to the prior. An MRI could better assess for acute peri-infarct ischemia if clinically warranted. 3. Remote left PCA territory infarct. These results will be called to the ordering clinician or representative by the Radiologist Assistant, and communication documented in the PACS or [employer]. Electronically Signed   By: Feliberto Harts M.D.   On: 09/15/2023 12:37   [place] CHEST (PLEURAL EFFUSION)  Result Date: 09/14/2023 CLINICAL DATA:  Patient admitted with acute heart failure  exacerbation presents today with bilateral pleural effusions. EXAM: CHEST ULTRASOUND COMPARISON:  None Available. FINDINGS: Limited ultrasound examination of both lungs revealed scant pleural fluid on the left side and small volume pleural fluid on the right side. Procedure risks and benefits were discussed with the patient and given the small volume of the pleural fluid on the right side the patient elected not to have procedure performed. Patient aware that the healthcare team had requested labs. Patient still refused. IMPRESSION: Small volume pleural fluid in the right lung. Patient refused procedure. No procedure performed. Ultrasound images obtained by Alwyn Ren NP. Electronically Signed   By: Marliss Coots M.D.   On: 09/14/2023 13:48   ECHOCARDIOGRAM LIMITED  Result Date: 09/14/2023    ECHOCARDIOGRAM LIMITED REPORT   Patient Name:   Georjean Kichline Date of Exam: 09/14/2023 Medical Rec #:  [id number]     Height:       60.0 in Accession #:    [id number]    Weight:       86.6 lb Date of Birth:  10-30-1956      BSA:          1.308 m Patient Age:    67 years      BP:           155/74 mmHg Patient Gender: F             HR:           72 bpm. Exam Location:  Inpatient Procedure: Limited Echo, Color Doppler and Cardiac Doppler Indications:    Pericardial Effusion  History:        Patient has prior history of Echocardiogram examinations, most                 recent 09/13/2023. CHF, Stroke; Risk Factors:Hypertension,                 Diabetes, Dyslipidemia and Former Smoker.  Sonographer:    Milbert Coulter Referring Phys: Maisie Fus IMPRESSIONS  1. Left ventricular ejection fraction, by estimation, is 30 to 35%. The left ventricle has moderately decreased function. There is severe left ventricular hypertrophy.  2.  Right ventricular systolic function is mildly reduced. The right ventricular size is normal.  3. Moderate pericardial effusion, improved from prior study. . There is no evidence of cardiac tamponade.  4.  Mild to moderate mitral valve regurgitation.  5. The inferior vena cava is normal in size with greater than 50% respiratory variability, suggesting right atrial pressure of 3 mmHg. Conclusion(s)/Recommendation(s): Pericardial effusion has improved. still no cardiac tamponade. FINDINGS  Left Ventricle: Left ventricular ejection fraction, by estimation, is 30 to 35%. The left ventricle has moderately decreased function. There is severe left ventricular hypertrophy. Right Ventricle: The right ventricular size is normal. Right ventricular systolic function is mildly reduced. Pericardium: Moderate pericardial effusion, improved from prior study. There is no evidence of cardiac tamponade. Mitral Valve: Mild to moderate mitral valve regurgitation. Tricuspid Valve: Tricuspid valve regurgitation is not demonstrated. Venous: The inferior vena cava is normal in size with greater than 50% respiratory variability, suggesting right atrial pressure of 3 mmHg. Additional Comments: Spectral Doppler performed. Color Doppler performed.  Carolan Clines Electronically signed by Carolan Clines Signature Date/Time: 09/14/2023/11:51:06 AM    Final     Patient Profile     67 y.o. female  with a hx of carotid artery stenosis, hypertension, vertigo, CAD, DM2, brain aneurysm with stent placed, CVA, right breast cancer s/p mastectomy and did not receive radiation or chemotherapy, tobacco abuse,, and angioedema with lisinopril who is being seen 09/13/2023 for the evaluation of pericardial effusion at the request of Dr. Antony Contras.   Assessment & Plan    Moderate-Large Pericardial effusion -concern for malignant effusion in the differential as she  also has bilateral pleural effusions -patient declined thoracentesis  -no signs of tamponade currently clinically.  There is some subtle RV collapse during diastole on echo however the IVC is normal on her echo -repeat echo 10/13 with improvement in effusion with normal IVC dimensions and normal  respirophasic change in MV inflow.  No evidence of tamponade   CAD -DES-RCA 11/2022 for an NSTEMI -remains on DAPT with ASA and brilinta -Previously okayed for 90 mg Brilinta twice daily, however patient changed to 45 mg Brilinta twice daily -at cath in 11/2022 she was supposed to change from low dose Brilinta to Plavix 75mg  daily>>she is not protected on Brilinta 45mg  BID>>initially was going to change to Plavix 75mg  daily for better compliance but given acute CVA she has been change to Brilinta 90mg  BID -continue ASA 81mg  daily -Carvedilol has been stopped to allow for permissive HTN in setting of acute CVA -continue Atorvastatin 80mg  daily   Chronic systolic heart failure Nonischemic cardiomyopathy/ischemic cardiomyopathy EF 30-35%. RV function is mildly reduced. Mild- Mod MR Thought 2/2 CAD and hypertension - appears euvolemic on exam today - Not a candidate for Entresto given angioedema with ACE inhibitors - attempted addition of Bidil yesterday for GMDT and HTN but SBP dropped into the 80's shortly after taking it.  Tolerated IV NTG gtt 2 days ago when she presented in setting of hypertensive urgency - Carvedilol and Jardiance stopped yesterday due to acute CVA and need for permissive HTN - Cleda Daub stopped due to acute hyperkalemia yesterday - BP stable at 130/57mmHg today - will hold on GDMT for now until ok to slowly restart per Neuro   HTN -stopped hydrochlorothiazide yesterday -Carvedilol, Jardiance and Spiro on hold to allow for permissive HTN in setting of acute CVA -no ACE/ARB due to hx of angioedema -tried Bidil but developed Hypotension and had to be stopped  Acute CVA -code stroke  was called yesterday for LUE weakness and left facial droop, as well as slurred speech.  -CT angiography head and neck with concern for narrowing at the intracranial stent in the right MCA.  -Stat MRI was done that shows a small stroke in the right hemisphere, likely related to the in-stent  narrowing -now on Brilinta 90mg  BID and ASA 81mg  daily.    Total time spent with patient today 35 minutes. This includes reviewing records, evaluating the patient and coordinating care. Face-to-face time >50%.    For questions or updates, please contact [facility] Please consult [email or web address] for contact info under        Signed, Armanda Magic, MD  09/16/2023, 7:54 AM

## 2023-09-16 NOTE — Plan of Care (Signed)

## 2023-09-16 NOTE — Progress Notes (Addendum)
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   Overnight: Improved, refused PT. BP   Subjective: Is able to swallow, speak, ambulate. Complaining of no residual deficits. States that she was not taking her medications as prescribed or how her daughter would set them up for her due to concern for side effects. Reports  no problem with ambulation.  Objective:   Vital signs in last 24 hours: Vitals:   09/15/23 2358 09/16/23 0417 09/16/23 0807 09/16/23 1125  BP: (!) 103/52 130/68 (!) 140/74 (!) 153/59  Pulse: 79 81 83 74  Resp: 16 16 14 16   Temp: 98 F (36.7 C) 98.6 F (37 C) 98.2 F (36.8 C) 98 F (36.7 C)  TempSrc:  Oral Oral Oral  SpO2: 100% 99% 100% 100%  Weight:      Height:       Supplemental O2: Room Air Last BM Date : 09/16/23 SpO2: 100 % FiO2 (%): 30 % Filed Weights   09/13/23 1353 09/14/23 0442 09/15/23 0445  Weight: 40.3 kg 39.3 kg 40.1 kg    Intake/Output Summary (Last 24 hours) at 09/16/2023 1442 Last data filed at 09/16/2023 0900 Gross per 24 hour  Intake 560 ml  Output --  Net 560 ml   Net IO Since Admission: 485.3 mL [09/16/23 1442]  Physical Exam General: NAD, cachetic appearance HENT: NCAT Lungs: lung sounds clear to auscultation bilaterally  Cardiovascular: RRR, no lower extremity edema, no JVD Abdomen: No TTP, bowel sounds present MSK: no asymmetry Skin: no lesions noted.  Neuro: alert and oriented, CN 2-12 intact, vision at baseline (has glaucoma) no word finding difficulty, no slurred speech, 5/5 motor function BLE and BUE. FTN normal.  Psych: normal mood and normal affect  Diagnostics    Latest Ref Rng & Units 09/16/2023    4:51 AM 09/15/2023    3:32 AM 09/14/2023    7:52 AM  CBC  WBC 4.0 - 10.5 K/uL 6.2  6.8  8.2   Hemoglobin 12.0 - 15.0 g/dL 16.1  09.6  04.5    Hematocrit 36.0 - 46.0 % 36.2  38.0  38.7   Platelets 150 - 400 K/uL 202  182  181        Latest Ref Rng & Units 09/16/2023    4:51 AM 09/15/2023    3:45 PM 09/15/2023    3:32 AM  BMP  Glucose 70 - 99 mg/dL 69   79   BUN 8 - 23 mg/dL 18   17   Creatinine 4.09 - 1.00 mg/dL 8.11   9.14   Sodium 782 - 145 mmol/L 132   131   Potassium 3.5 - 5.1 mmol/L 4.5  4.7  5.7   Chloride 98 - 111 mmol/L 100   102   CO2 22 - 32 mmol/L 17   18   Calcium 8.9 - 10.3 mg/dL 9.4   9.1      Assessment/Plan: Principal Problem:   Acute on chronic HFrEF (heart failure with reduced ejection fraction) (HCC) Active Problems:   Hypertension   Lung nodule   Elevated serum creatinine   Diarrhea   Protein-calorie malnutrition, severe (HCC)   Metabolic acidosis   Underweight   Pleural effusion   Acute stroke due to ischemia (HCC)   #Stroke R MCA Developed sudden onset left facial and arm weakness yesterday.  Likely in the setting of hypotension with stenosis of the R MCA stent. Neurology following and appreciate their recs. Today she is neurologically intact. Has no diabetes. Hgb A1c is 5.6. Elevated LDL and decreased HDL. Unsure if she was taking lipitor or not. Appreciate pharmacy's involvement with med reconciliation, as today patient admitted throwing some of her medications down the sink because it would make her feel ill. Will continue Atorvastatin 40mg  for now, until it is clear that patient has been taking. Will consider increasing to 80 mg if she was taking it.  -Cw ASA and Brillinta 90mg  BID.   #HFrEF 40-45% exacerbation - resolved #R>L Pleural Effusion #Moderate-large pleural effusion #Hx of Breast Cancer Event resolved. She is euvolemic on exam. Hypotension resolved and recurrent pericardial effusion is less likely.  -Low sodium diet -Strict I's and O's -Daily weights- standing only -Cardiology consulted; appreciate their recommendations -Would need FU with heart failure transition team to  aid with medications  -Hold lasix today as patient is euvolemic.  -holding spironolactone 25mg  daily, jardiance 10mg  daily in setting of stroke- permissive HTN -Holding Coreg in the setting of most recent stroke -Not a candidate for Entresto given angioedema with ACE inhibitors   #Chronic Diarrhea Is currently on a Z-pak for possible PNA. GI panel negative. Elastase negative. Loose phlegm yellow stools likely inflammatory in nature. Follows with GI outpatient.  -continue to trend BMP for GI electrolyte losses -fecal elastase pending  #Hyperkalemia- resolved K WNL today. Will continue to trend with RFP.   #PNA  Lungs clear to auscultation, no cough. Azithromycin (z-pak) started 10/13. Cw this until completion 10/18.  #HLD c/b CAD s/p stent in 11/2022 On lipitor 40 mg every day, brillinta 90 mg BID and aspirin 81 mg daily. Pending med rec with pharmacy.   #Cachexia BMI is currently 17. Hx of Breast Cancer. RD consult placed. Not a candidate for megestrol but could consider dronabinol in the future.   #AKI Cr jumped to 1.8 today, likely due to decreased perfusion from hypotension and/or decreased PO intake . Keep encouraging PO intake.   #HTN Holding spironolactone, farxiga, hydrochlorothiazide, coreg and bidil. Got hypotensive yesterday and overnight. Will continue to hold these meds.   #Lung Nodule Incidental finding on chest CT, 5mm right solid pulmonary nodule. As patient has history of cancer and long smoking history, non-con chest CT at 12 months is optional. Will need to FU with PCP.   #Glaucoma Chronic. Home eye drops Timolol and Latanoprost.  Diet: Heart Healthy IVF: None VTE: Lovenox Code: Full Prior to Admission Living Arrangement: Home Anticipated Discharge Location: Home Barriers to Discharge: Medical Stability Dispo: Anticipated discharge in approximately 2 day(s).   Manuela Neptune, MD  Eligha Bridegroom. [facility] Internal Medicine Residency,  PGY-1  Pager: [phone] After 5 pm and on weekends: Please call the on-call pager

## 2023-09-16 NOTE — Progress Notes (Signed)
SLP Cancellation Note  Patient Details Name: Stephnie Parlier MRN: [id number] DOB: [birth year]   Cancelled treatment:       Reason Eval/Treat Not Completed: SLP screened, no needs identified, will sign off. Patient's RN reported that she has not observed any swallowing difficulties today. SLP talked briefly with patient and she acknowledged an instance previous date of coughing and spitting out water but this has not occurred today. Swallow evaluation not needed at this time. Thank you for this consult!   Angela Nevin, MA, CCC-SLP Speech Therapy

## 2023-09-16 NOTE — Progress Notes (Signed)
OT Cancellation Note  Patient Details Name: Amy Howell MRN: [id number] DOB: [birth year]   Cancelled Treatment:    Reason Eval/Treat Not Completed: OT screened, no needs identified, will sign off.  Pt is independent and at functional baseline.  No OT needs  Rilya Longo OTR/L 09/16/2023, 9:45 AM

## 2023-09-16 NOTE — Progress Notes (Signed)
   Heart Failure Stewardship Pharmacist Progress Note   PCP: Rana Snare, DO PCP-Cardiologist: Maisie Fus, MD    HPI:  67 yo F with PMH of CHF, HTN, T2DM, brain aneurysm with stent placed, CVA, CAD/PCI, breast cancer s/p R mastectomy.   Admitted 11/2022 with increased shortness of breath in the setting of acute HFrEF. Cath showed 75% stenosis of mid RCA s/p PCI with DES. EF 30-35%. Hospitalization complicated by AKI. She was started on losartan, carvedilol, and spironolactone. Later, the losartan was stopped due to headache.   ECHO 01/2023 with EF 40-45%.   Presented to the ED on 10/11 with shortness of breath and diarrhea. Also complaining of orthopnea and unintentional weight loss. CXR with enlarged heart and vascular congestion with interstitial edema. BNP 2632. CTA negative for PE. ECHO 10/12 showed LVEF 30-35%, moderate-large pericardial effusion (no evidence of tamponade on repeat ECHO 10/13), mild-moderate MR. Refused thoracentesis on 10/13. Code stroke was called on 10/14 with LUE weakness and left facial droop with slurred speech. CT angiography head and neck with concern for narrowing at the intracranial stent in the right MCA. Stat MRI was done that shows a small stroke in the right hemisphere, likely related to the in-stent narrowing. TNK not used due to mild symptoms with rapid improvement.   Current HF Medications: None  Prior to admission HF Medications: Beta blocker: carvedilol 6.25 mg BID MRA: spironolactone 25 mg daily *also on hydrochlorothiazide 12.5 mg daily  Pertinent Lab Values: Serum creatinine 1.39>1.80, BUN 18, Potassium 4.5, Sodium 132, BNP 2632.8, Magnesium 1.6   Vital Signs: Weight: 88 lbs (admission weight: 90 lbs) Blood pressure: 100-140/70s  Heart rate: 70-80s  I/O: incomplete  Medication Assistance / Insurance Benefits Check: Does the patient have prescription insurance?  Yes Type of insurance plan: [employer]  Outpatient Pharmacy:   Prior to admission outpatient pharmacy: [facility] OP Is the patient willing to use [facility] TOC pharmacy at discharge? Yes    Assessment: 1. Acute on chronic systolic CHF (LVEF 30-35%). NYHA class II symptoms. - Off IV lasix, volume improved. Strict I/Os and daily weights. Keep K>4 and Mg>2.  - History of only taking it once daily instead of BID. May need to transition to metoprolol XL at discharge (currently holding BB for permissive HTN) - History of angioedema on ACE-I. Intolerant to losartan 2/2 headaches.  - Holding spironolactone and Jardiance with AKI - Holding BiDil with hypotensive episode on 10/14 - Consider discontinuing hydrochlorothiazide on discharge  Plan: 1) Medication changes recommended at this time: - may need to adjust carvedilol to metoprolol XL at discharge for compliance concern - discontinue hydrochlorothiazide on discharge  2) Patient assistance: - BiDil copay $47 - Jardiance copay $47  3)  Education  - To be completed prior to discharge  Sharen Hones, PharmD, BCPS Heart Failure Stewardship Pharmacist Phone [phone]

## 2023-09-16 NOTE — TOC CM/SW Note (Signed)
Transition of Care [facility]) - Inpatient Brief Assessment   Patient Details  Name: Amy Howell MRN: [id number] Date of Birth: [birth year]  [facility] CM/SW Contact:    Kermit Balo, RN Phone Number: 09/16/2023, 3:07 PM   Clinical Narrative:  No f/u per PT and no DME needs.   Transition of Care Asessment: Insurance and Status: Insurance coverage has been reviewed Patient has primary care physician: Yes Home environment has been reviewed: home with daughter   Prior/Current Home Services: No current home services Social Determinants of Health Reivew: SDOH reviewed no interventions necessary Readmission risk has been reviewed: Yes Transition of care needs: no transition of care needs at this time

## 2023-09-16 NOTE — Progress Notes (Signed)
STROKE TEAM PROGRESS NOTE   SUBJECTIVE (INTERVAL HISTORY) Her daughters are at the bedside.  Overall her condition is completely resolved. She is neuro stable at this time. 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 Discussed with Dr. Corliss Skains.    OBJECTIVE Temp:  [97.7 F (36.5 C)-98.6 F (37 C)] 98 F (36.7 C) (10/15 1125) Pulse Rate:  [74-92] 74 (10/15 1125) Cardiac Rhythm: Normal sinus rhythm (10/15 0826) Resp:  [14-18] 16 (10/15 1125) BP: (96-153)/(45-74) 153/59 (10/15 1125) SpO2:  [99 %-100 %] 100 % (10/15 1125)  Recent Labs  Lab 09/15/23 1209  GLUCAP 99   Recent Labs  Lab 09/12/23 1249 09/12/23 1726 09/13/23 0759 09/13/23 1259 09/14/23 0352 09/15/23 0332 09/15/23 1545 09/16/23 0451  NA 137 138  --  135 133* 131*  --  132*  K 4.3 3.9  --  3.1* 4.6 5.7* 4.7 4.5  CL 106  --   --  101 101 102  --  100  CO2 18*  --   --  22 21* 18*  --  17*  GLUCOSE 111*  --   --  114* 73 79  --  69*  BUN 22  --   --  16 15 17   --  18  CREATININE 1.22*  --   --  1.26* 1.36* 1.39*  --  1.80*  CALCIUM 9.1  --   --  8.2* 8.6* 9.1  --  9.4  MG  --   --  1.6*  --   --   --   --   --   PHOS  --   --   --   --   --  2.8  --   --    Recent Labs  Lab 09/12/23 1249 09/15/23 0332  AST 55*  --   ALT 67*  --   ALKPHOS 132*  --   BILITOT 0.9  --   PROT 6.9  --   ALBUMIN 3.3* 2.9*   Recent Labs  Lab 09/12/23 1249 09/12/23 1726 09/14/23 0752 09/15/23 0332 09/16/23 0451  WBC 6.2  --  8.2 6.8 6.2  NEUTROABS 4.6  --  6.5  --  4.7  HGB 12.8 13.6 12.2 12.1 11.5*  HCT 42.6 40.0 38.7 38.0 36.2  MCV 97.9  --  91.3 93.1 91.4  PLT 195  --  181 182 202   No results for input(s): "CKTOTAL", "CKMB", "CKMBINDEX", "TROPONINI" in the last 168 hours. No results for input(s): "LABPROT", "INR" in the last 72 hours. No results for input(s): "COLORURINE", "LABSPEC", "PHURINE", "GLUCOSEU", "HGBUR", "BILIRUBINUR", "KETONESUR", "PROTEINUR",  "UROBILINOGEN", "NITRITE", "LEUKOCYTESUR" in the last 72 hours.  Invalid input(s): "APPERANCEUR"     Component Value Date/Time   CHOL 141 09/16/2023 0451   CHOL 176 06/30/2023 0805   TRIG 90 09/16/2023 0451   HDL 32 (L) 09/16/2023 0451   HDL 50 06/30/2023 0805   CHOLHDL 4.4 09/16/2023 0451   VLDL 18 09/16/2023 0451   LDLCALC 91 09/16/2023 0451   LDLCALC 110 (H) 06/30/2023 0805   Lab Results  Component Value Date   HGBA1C 5.6 09/16/2023   No results found for: "LABOPIA", "COCAINSCRNUR", "LABBENZ", "AMPHETMU", "THCU", "LABBARB"  No results for input(s): "ETH" in the last 168 hours.  I have personally reviewed the radiological images below and agree with the radiology interpretations.  MR BRAIN WO CONTRAST  Result Date: 09/15/2023 CLINICAL DATA:  Neuro deficit, acute, stroke suspected. EXAM:  MRI HEAD WITHOUT CONTRAST TECHNIQUE: Multiplanar, multiecho pulse sequences of the brain and surrounding structures were obtained without intravenous contrast. COMPARISON:  CT studies same day.  MRI 03/27/2017. FINDINGS: Brain: Diffusion imaging shows an acute 6 x 8 mm infarction in the subcortical white matter of the right posterior frontal lobe. No other acute insult. Chronic small-vessel ischemic changes affect the pons. 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 Old bilateral thalamic lacunar infarctions. Few old small vessel infarctions of the basal ganglia. Widespread chronic small-vessel ischemic changes of the cerebral hemispheric deep and subcortical white matter. No mass, hemorrhage, hydrocephalus or extra-axial collection. Few scattered punctate foci of hemosiderin deposition related to old small vessel infarctions. Vascular: Major vessels at the base of the brain show flow. Skull and upper cervical spine: Negative Sinuses/Orbits: Clear/normal Other: None IMPRESSION: 1. 6 x 8 mm acute  infarction in the subcortical white matter of the right posterior frontal lobe. No swelling or hemorrhage. 2. Extensive chronic small-vessel ischemic changes elsewhere throughout the brain as outlined above. 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 Electronically Signed   By: Paulina Fusi M.D.   On: 09/15/2023 16:09   CT ANGIO HEAD NECK W WO CM (CODE STROKE)  Result Date: 09/15/2023 CLINICAL DATA:  Code stroke.  Neuro deficit, acute stroke suspected. EXAM: CT ANGIOGRAPHY HEAD AND NECK WITH AND WITHOUT CONTRAST TECHNIQUE: Multidetector CT imaging of the head and neck was performed using the standard protocol during bolus administration of intravenous contrast. Multiplanar CT image reconstructions and MIPs were obtained to evaluate the vascular anatomy. Carotid stenosis measurements (when applicable) are obtained utilizing NASCET criteria, using the distal internal carotid diameter as the denominator. RADIATION DOSE REDUCTION: This exam was performed according to the departmental dose-optimization program which includes automated exposure control, adjustment of the mA and/or kV according to patient size and/or use of iterative reconstruction technique. CONTRAST:  75mL OMNIPAQUE IOHEXOL 350 MG/ML SOLN COMPARISON:  CT head without contrast 09/15/2023 FINDINGS: CTA NECK FINDINGS Aortic arch: Extensive atherosclerotic changes are present at the aortic arch. The great vessel origins are not imaged. No focal stenosis or aneurysm is visualized. Right carotid system: Mild wall irregularity is present in the distal right common carotid artery without significant stenosis. Atherosclerotic calcifications are present at the right carotid bifurcation without significant stenosis. The cervical right ICA is otherwise normal. Left carotid system: The left common carotid artery is within normal limits. Minimal atherosclerotic calcifications are present at the bifurcation without  significant stenosis. The cervical left ICA is normal. Vertebral arteries: The left vertebral artery is the dominant vessel. Both vertebral arteries originate from the subclavian arteries without significant stenosis. No significant stenosis is present in either vertebral artery in the neck. Skeleton: Mild degenerative changes are present most evident at C5-6. Vertebral body heights are maintained. No focal osseous lesions are present. Other neck: Soft tissues the neck are otherwise unremarkable. Salivary glands are within normal limits. Thyroid is normal. No significant adenopathy is present. No focal mucosal or submucosal lesions are present. Upper chest: Centrilobular emphysematous changes are present. No nodule or mass lesion is present. The thoracic inlet is within normal limits. Review of the MIP images confirms the above findings CTA HEAD FINDINGS Anterior circulation: Atherosclerotic calcifications are present within the cavernous internal carotid arteries bilaterally without significant stenosis. The left ICA terminus is within normal limits. Overlapping stents extend from the right ICA terminus into a  right M2 branch. High-grade M2 stenoses are present just distal to the stent with asymmetric attenuation of right MCA branches. No residual aneurysm is present. Distal segmental narrowing is present in the ACA branch vessels and right MCA branch vessels. No other significant proximal stenosis or occlusion is present. Posterior circulation: The left vertebral artery is dominant vessel. The PICA origins are visualized and normal. Atherosclerotic changes are present at the vertebrobasilar junction with a high-grade proximal basilar artery stenosis. The more distal basilar artery is attenuated. Both posterior cerebral arteries originate from basilar tip. High-grade stenosis is present in proximal right P1 segment. Moderate narrowing is present in the inferior P3 segments bilaterally. Venous sinuses: The dural  sinuses are patent. The straight sinus and deep cerebral veins are intact. Cortical veins are within normal limits. No significant vascular malformation is evident. Anatomic variants: None Review of the MIP images confirms the above findings IMPRESSION: 1. Overlapping stents extend from the right ICA terminus into a right M2 branch. 2. High-grade M2 stenoses just distal to the stent with asymmetric attenuation of right MCA branches. This may result in decreased cerebral blood flow to portions of the right MCA territory. 3. No residual aneurysm. 4. Distal segmental narrowing of the ACA branch vessels and right MCA branch vessels. 5. 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 6. High-grade stenosis of the proximal right P1 segment. 7. Moderate narrowing of the inferior P3 segments bilaterally. 8. Aortic Atherosclerosis (ICD10-I70.0) and Emphysema (ICD10-J43.9). These results were called by telephone at the time of interpretation on 09/15/2023 at 1:00 pm to provider [facility] , who verbally acknowledged these results. Electronically Signed   By: Marin Roberts M.D.   On: 09/15/2023 13:00   CT HEAD CODE STROKE WO CONTRAST`  Result Date: 09/15/2023 CLINICAL DATA:  Code stroke.  Mental status change, unknown cause EXAM: CT HEAD WITHOUT CONTRAST TECHNIQUE: Contiguous axial images were obtained from the base of the skull through the vertex without intravenous contrast. RADIATION DOSE REDUCTION: This exam was performed according to the departmental dose-optimization program which includes automated exposure control, adjustment of the mA and/or kV according to patient size and/or use of iterative reconstruction technique. COMPARISON:  None Available. FINDINGS: Brain: No evidence of acute large vascular territory infarction, hemorrhage, hydrocephalus, extra-axial collection or mass lesion/mass effect. Remote appearing right PCA territory infarct is expanded compared to the prior. Remote left cerebellar  infarct. Vascular: Right MCA stent. Skull: No acute fracture. Sinuses/Orbits: Clear sinuses.  No acute orbital findings. ASPECTS [facility] Stroke Program Early CT Score) total score (0-10 with 10 being normal): 10. IMPRESSION: 1. No definite evidence of acute large vascular territory infarct or acute hemorrhage. 2. Remote appearing right PCA territory infarct appears expanded compared to the prior. An MRI could better assess for acute peri-infarct ischemia if clinically warranted. 3. Remote left PCA territory infarct. These results will be called to the ordering clinician or representative by the Radiologist Assistant, and communication documented in the PACS or [employer]. Electronically Signed   By: Feliberto Harts M.D.   On: 09/15/2023 12:37   [place] CHEST (PLEURAL EFFUSION)  Result Date: 09/14/2023 CLINICAL DATA:  Patient admitted with acute heart failure exacerbation presents today with bilateral pleural effusions. EXAM: CHEST ULTRASOUND COMPARISON:  None Available. FINDINGS: Limited ultrasound examination of both lungs revealed scant pleural fluid on the left side and small volume pleural fluid on the right side. Procedure risks and benefits were discussed with the patient and given the small volume of the pleural fluid  on the right side the patient elected not to have procedure performed. Patient aware that the healthcare team had requested labs. Patient still refused. IMPRESSION: Small volume pleural fluid in the right lung. Patient refused procedure. No procedure performed. Ultrasound images obtained by Alwyn Ren NP. Electronically Signed   By: Marliss Coots M.D.   On: 09/14/2023 13:48   ECHOCARDIOGRAM LIMITED  Result Date: 09/14/2023    ECHOCARDIOGRAM LIMITED REPORT   Patient Name:   Amy Howell Date of Exam: 09/14/2023 Medical Rec #:  [id number]     Height:       60.0 in Accession #:    [id number]    Weight:       86.6 lb Date of Birth:  [birth year]      BSA:          1.308 m Patient  Age:    67 years      BP:           155/74 mmHg Patient Gender: F             HR:           72 bpm. Exam Location:  Inpatient Procedure: Limited Echo, Color Doppler and Cardiac Doppler Indications:    Pericardial Effusion  History:        Patient has prior history of Echocardiogram examinations, most                 recent 09/13/2023. CHF, Stroke; Risk Factors:Hypertension,                 Diabetes, Dyslipidemia and Former Smoker.  Sonographer:    Milbert Coulter Referring Phys: Maisie Fus IMPRESSIONS  1. Left ventricular ejection fraction, by estimation, is 30 to 35%. The left ventricle has moderately decreased function. There is severe left ventricular hypertrophy.  2. Right ventricular systolic function is mildly reduced. The right ventricular size is normal.  3. Moderate pericardial effusion, improved from prior study. . There is no evidence of cardiac tamponade.  4. Mild to moderate mitral valve regurgitation.  5. The inferior vena cava is normal in size with greater than 50% respiratory variability, suggesting right atrial pressure of 3 mmHg. Conclusion(s)/Recommendation(s): Pericardial effusion has improved. still no cardiac tamponade. FINDINGS  Left Ventricle: Left ventricular ejection fraction, by estimation, is 30 to 35%. The left ventricle has moderately decreased function. There is severe left ventricular hypertrophy. Right Ventricle: The right ventricular size is normal. Right ventricular systolic function is mildly reduced. Pericardium: Moderate pericardial effusion, improved from prior study. There is no evidence of cardiac tamponade. Mitral Valve: Mild to moderate mitral valve regurgitation. Tricuspid Valve: Tricuspid valve regurgitation is not demonstrated. Venous: The inferior vena cava is normal in size with greater than 50% respiratory variability, suggesting right atrial pressure of 3 mmHg. Additional Comments: Spectral Doppler performed. Color Doppler performed.  Carolan Clines Electronically  signed by Carolan Clines Signature Date/Time: 09/14/2023/11:51:06 AM    Final    ECHOCARDIOGRAM COMPLETE  Result Date: 09/13/2023    ECHOCARDIOGRAM REPORT   Patient Name:   SHYLIE GOETZE Date of Exam: 09/13/2023 Medical Rec #:  [id number]     Height:       60.0 in Accession #:    [id number]    Weight:       90.0 lb Date of Birth:  1956/02/28      BSA:          1.329 m Patient Age:    54 years  BP:           167/69 mmHg Patient Gender: F             HR:           100 bpm. Exam Location:  Inpatient Procedure: 2D Echo, Color Doppler and Cardiac Doppler Indications:    CHF  History:        Patient has prior history of Echocardiogram examinations, most                 recent 02/03/2023. CHF, Breast Cancer and Stroke; Risk                 Factors:Hypertension, Diabetes, Dyslipidemia and Former Smoker.  Sonographer:    Milbert Coulter Referring Phys: [id number] CAROLYN GUILLOUD IMPRESSIONS  1. Moderate-large pericardial effusion, primarily over RV, with epicardial fat. IVC is grossly normal. There is no RV diastolic collapse, but there may be subtle decreased diastolic expansion of the RV. No significant respiratory variability of the septum. No respiratory variability of mitral inflow signal. BP on echo 169/69, HR 100. Finding in combination do not suggest tamponade, however given size of effusion, would consider close follow up exam within 2-3 days to ensure no change. Moderate-large effusion.  2. Left ventricular ejection fraction, by estimation, is 30 to 35%. The left ventricle has moderately decreased function. The left ventricle demonstrates global hypokinesis. There is mild left ventricular hypertrophy. Indeterminate diastolic filling due  to E-A fusion.  3. Right ventricular systolic function is mildly reduced. The right ventricular size is normal. Tricuspid regurgitation signal is inadequate for assessing PA pressure.  4. Left atrial size was mildly dilated.  5. The mitral valve is grossly normal. Mild to moderate  mitral valve regurgitation. No evidence of mitral stenosis.  6. The aortic valve is tricuspid. Aortic valve regurgitation is mild to moderate (BP noted 169/69). No aortic stenosis is present. Aortic regurgitation PHT measures 360 msec.  7. The inferior vena cava is normal in size with <50% respiratory variability, suggesting right atrial pressure of 8 mmHg. FINDINGS  Left Ventricle: Left ventricular ejection fraction, by estimation, is 30 to 35%. The left ventricle has moderately decreased function. The left ventricle demonstrates global hypokinesis. The left ventricular internal cavity size was normal in size. There is mild left ventricular hypertrophy. Indeterminate diastolic filling due to E-A fusion. Right Ventricle: The right ventricular size is normal. No increase in right ventricular wall thickness. Right ventricular systolic function is mildly reduced. Tricuspid regurgitation signal is inadequate for assessing PA pressure. Left Atrium: Left atrial size was mildly dilated. Right Atrium: Right atrial size was normal in size. Pericardium: Moderate-large pericardial effusion, primarily over RV, with epicardial fat. IVC is grossly normal. There is no RV diastolic collapse, but there may be subtle decreased diastolic expansion of the RV. No significant respiratory variability of  the septum. No respiratory variability of mitral inflow signal. BP on echo 169/69, HR 100. Finding in combination do not suggest tamponade, however given size of effusion, would consider close follow up exam within 2-3 days to ensure no change. Moderate-large effusion. Mitral Valve: The mitral valve is grossly normal. Mild to moderate mitral valve regurgitation. No evidence of mitral valve stenosis. Tricuspid Valve: The tricuspid valve is normal in structure. Tricuspid valve regurgitation is trivial. No evidence of tricuspid stenosis. Aortic Valve: The aortic valve is tricuspid. Aortic valve regurgitation is mild to moderate. Aortic  regurgitation PHT measures 360 msec. No aortic stenosis is present. Aortic valve mean gradient measures  8.0 mmHg. Aortic valve peak gradient measures 15.2  mmHg. Aortic valve area, by VTI measures 1.87 cm. Pulmonic Valve: The pulmonic valve was normal in structure. Pulmonic valve regurgitation is trivial. No evidence of pulmonic stenosis. Aorta: The aortic root is normal in size and structure. Venous: The inferior vena cava is normal in size with less than 50% respiratory variability, suggesting right atrial pressure of 8 mmHg. IAS/Shunts: The interatrial septum was not well visualized.  LEFT VENTRICLE PLAX 2D LVIDd:         4.50 cm   Diastology LVIDs:         3.50 cm   LV e' medial:    5.33 cm/s LV PW:         1.20 cm   LV E/e' medial:  11.0 LV IVS:        1.20 cm   LV e' lateral:   4.35 cm/s LVOT diam:     1.90 cm   LV E/e' lateral: 13.5 LV SV:         51 LV SV Index:   38 LVOT Area:     2.84 cm  RIGHT VENTRICLE RV Basal diam:  2.30 cm RV Mid diam:    1.90 cm RV S prime:     11.10 cm/s TAPSE (M-mode): 2.0 cm LEFT ATRIUM             Index        RIGHT ATRIUM           Index LA diam:        2.90 cm 2.18 cm/m   RA Area:     10.90 cm LA Vol (A2C):   49.7 ml 37.38 ml/m  RA Volume:   22.00 ml  16.55 ml/m LA Vol (A4C):   36.8 ml 27.68 ml/m LA Biplane Vol: 45.3 ml 34.07 ml/m  AORTIC VALVE AV Area (Vmax):    1.96 cm AV Area (Vmean):   1.77 cm AV Area (VTI):     1.87 cm AV Vmax:           195.00 cm/s AV Vmean:          127.000 cm/s AV VTI:            0.272 m AV Peak Grad:      15.2 mmHg AV Mean Grad:      8.0 mmHg LVOT Vmax:         135.00 cm/s LVOT Vmean:        79.100 cm/s LVOT VTI:          0.179 m LVOT/AV VTI ratio: 0.66 AI PHT:            360 msec  AORTA Ao Root diam: 2.70 cm MITRAL VALVE MV Area (PHT): 5.97 cm     SHUNTS MV Decel Time: 127 msec     Systemic VTI:  0.18 m MR Peak grad: 121.9 mmHg    Systemic Diam: 1.90 cm MR Mean grad: 86.0 mmHg MR Vmax:      552.00 cm/s MR Vmean:     450.0 cm/s MV E  velocity: 58.70 cm/s MV A velocity: 119.00 cm/s MV E/A ratio:  0.49 Weston Brass MD Electronically signed by Weston Brass MD Signature Date/Time: 09/13/2023/2:04:08 PM    Final    CT Angio Chest PE W and/or Wo Contrast  Result Date: 09/12/2023 CLINICAL DATA:  High probability for PE. EXAM: CT ANGIOGRAPHY CHEST WITH CONTRAST TECHNIQUE: Multidetector CT imaging of the chest was performed using  the standard protocol during bolus administration of intravenous contrast. Multiplanar CT image reconstructions and MIPs were obtained to evaluate the vascular anatomy. RADIATION DOSE REDUCTION: This exam was performed according to the departmental dose-optimization program which includes automated exposure control, adjustment of the mA and/or kV according to patient size and/or use of iterative reconstruction technique. CONTRAST:  75mL OMNIPAQUE IOHEXOL 350 MG/ML SOLN COMPARISON:  None Available. FINDINGS: Cardiovascular: Heart is significantly enlarged. There is a small pericardial effusion. Aorta is normal in size. There are atherosclerotic calcifications of the aorta. There is adequate opacification of the pulmonary arteries to the segmental level. There is no evidence for pulmonary embolism. Mediastinum/Nodes: No enlarged mediastinal, hilar, or axillary lymph nodes. Thyroid gland, trachea, and esophagus demonstrate no significant findings. Lungs/Pleura: Small left and moderate right pleural effusions are present. There is no pneumothorax. Moderate emphysematous changes are present. Patchy ground-glass opacities are seen in the bilateral lower lobes and right middle lobe. Peripheral nodular density in the right upper lobe measures 5 mm image 4/61. There is bilateral lower lobe and right middle lobe peribronchial wall thickening. Upper Abdomen: No acute abnormality. Musculoskeletal: No chest wall abnormality. No acute or significant osseous findings. Review of the MIP images confirms the above findings. IMPRESSION:  1. No evidence for pulmonary embolism. 2. Cardiomegaly with small pericardial effusion. 3. Moderate right and small left pleural effusions. 4. Patchy ground-glass opacities in the bilateral lower lobes and right middle lobe worrisome for infection. 5. Bilateral lower lobe and right middle lobe peribronchial wall thickening worrisome for bronchitis 6. Right solid pulmonary nodule within the upper lobe measuring 5 mm. Per Fleischner Society Guidelines,if patient is low risk for malignancy, no routine follow-up imaging is recommended. If patient is high risk for malignancy, a non-contrast Chest CT at 12 months is optional. If performed and the nodule is stable at 12 months, no further follow-up is recommended. These guidelines do not apply to immunocompromised patients and patients with cancer. Follow up in patients with significant comorbidities as clinically warranted. For lung cancer screening, adhere to Lung-RADS guidelines. Reference: Radiology. 2017; 284(1):228-43. 7. Moderate emphysema. Aortic Atherosclerosis (ICD10-I70.0) and Emphysema (ICD10-J43.9). Electronically Signed   By: Darliss Cheney M.D.   On: 09/12/2023 19:49   CT ABDOMEN PELVIS W CONTRAST  Result Date: 09/12/2023 CLINICAL DATA:  Shortness of breath and diarrhea for the last month. Acute nonlocalized abdominal pain EXAM: CT ABDOMEN AND PELVIS WITH CONTRAST TECHNIQUE: Multidetector CT imaging of the abdomen and pelvis was performed using the standard protocol following bolus administration of intravenous contrast. RADIATION DOSE REDUCTION: This exam was performed according to the departmental dose-optimization program which includes automated exposure control, adjustment of the mA and/or kV according to patient size and/or use of iterative reconstruction technique. CONTRAST:  75mL OMNIPAQUE IOHEXOL 350 MG/ML SOLN COMPARISON:  CT abdomen and pelvis 03/27/2017 FINDINGS: Lower chest: Bilateral pleural effusions and ground-glass opacities. See  separate report for detailed findings of the chest. Hepatobiliary: Unremarkable. Pancreas: Unremarkable. Spleen: Unremarkable. Adrenals/Urinary Tract: Unremarkable adrenal glands. Low-attenuation lesions in the kidneys are statistically likely to represent cysts. No follow-up is required. No urinary calculi or hydronephrosis. Unremarkable bladder. Stomach/Bowel: Extensive sigmoid diverticulosis. Mild wall thickening with trace fluid about the sigmoid colon suggesting diverticulitis. No obstruction. Stomach and appendix within limits. Vascular/Lymphatic: Aortic atherosclerosis. No enlarged abdominal or pelvic lymph nodes. Unremarkable. Reproductive: Hysterectomy.  No adnexal mass. Other: No free intraperitoneal air. Musculoskeletal: No acute fracture. IMPRESSION: 1. Possible mild uncomplicated sigmoid diverticulitis. 2. Bilateral pleural effusions and ground-glass opacities. See  separate report for detailed findings of the chest Aortic Atherosclerosis (ICD10-I70.0). Electronically Signed   By: Minerva Fester M.D.   On: 09/12/2023 19:47   DG Chest 2 View  Result Date: 09/12/2023 CLINICAL DATA:  Shortness of breath EXAM: CHEST - 2 VIEW COMPARISON:  X-ray 11/21/2022 FINDINGS: Hyperinflation. Increasing small pleural effusions with the adjacent parenchymal opacities. There also some nodular areas suggested such as right midlung and right upper lung. No pneumothorax. Enlarged cardiopericardial silhouette with calcified aorta. Osteopenia. Degenerative changes of the spine IMPRESSION: Slightly enlarged heart with vascular congestion and some interstitial edema. Increasing pleural effusions with the adjacent opacities. There is also a suggestion of some nodular areas in the lung on the right. Although this could be infiltrative underlying lesions are possible. Overall recommend a follow-up contrast chest CT when appropriate Electronically Signed   By: Karen Kays M.D.   On: 09/12/2023 15:12     PHYSICAL  EXAM  Temp:  [97.7 F (36.5 C)-98.6 F (37 C)] 98 F (36.7 C) (10/15 1125) Pulse Rate:  [74-92] 74 (10/15 1125) Resp:  [14-18] 16 (10/15 1125) BP: (96-153)/(45-74) 153/59 (10/15 1125) SpO2:  [99 %-100 %] 100 % (10/15 1125)  General - Well nourished, well developed, in no apparent distress.  Ophthalmologic - fundi not visualized due to noncooperation.  Cardiovascular - Regular rhythm and rate.  Mental Status -  Level of arousal and orientation to time, place, and person were intact. Language including expression, naming, repetition, comprehension was assessed and found intact. Fund of Knowledge was assessed and was intact.  Cranial Nerves II - XII - II - Visual field intact OU. III, IV, VI - Extraocular movements intact. V - Facial sensation intact bilaterally. VII - Facial movement intact bilaterally. VIII - Hearing & vestibular intact bilaterally. X - Palate elevates symmetrically. XI - Chin turning & shoulder shrug intact bilaterally. XII - Tongue protrusion intact.  Motor Strength - The patient's strength was normal in all extremities and pronator drift was absent.  Bulk was normal and fasciculations were absent.   Motor Tone - Muscle tone was assessed at the neck and appendages and was normal.  Reflexes - The patient's reflexes were symmetrical in all extremities and she had no pathological reflexes.  Sensory - Light touch, temperature/pinprick were assessed and were symmetrical.    Coordination - The patient had normal movements in the hands and feet with no ataxia or dysmetria.  Tremor was absent.  Gait and Station - deferred.   ASSESSMENT/PLAN Ms. Wilmuth Olivetti is a 67 y.o. female with history of hypertension, hyperlipidemia, CAD, CHF, left breast cancer status post ectomy, smoker, history of stroke, brain aneurysm status post stenting admitted for CHF exacerbation and hypertensive urgency.  Received BP medications but developed dizziness, diaphoresis, generalized  weakness more on the left upper extremity and left facial droop.   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 Corliss Skains, plan for diagnostic cerebral angiogram in a.m. 2D Echo EF 30 to 35%, left ventricular hypertrophy, moderate to large pericardial effusion LDL 91 HgbA1c 5.6 UDS pending SCDs for VTE prophylaxis aspirin 81 mg daily and Brilinta (ticagrelor) 90 mg bid prior to admission, now on aspirin 81 mg daily and Brilinta (ticagrelor) 90 mg bid.  Patient counseled to be compliant with her antithrombotic medications Ongoing aggressive stroke risk factor management Therapy recommendations: None Disposition: Pending  History of stroke and brain aneurysm 03/2017 admitted for left hemianopia.  MRI showed bilateral occipital acute/subacute infarcts, old bilateral cerebellar infarcts.  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  EF 65 to 70%.  TEE unremarkable. Pan CT negative for malignancy.  Discharged on aspirin and Lipitor 40. 05/2017 status post right MCA stenting for aneurysm treatment with Dr. Corliss Skains. 08/2017 follow-up angiogram showed 50% right MCA stenosis, 90% basilar artery distal stenosis, 70 to 75% basilar artery proximal stenosis. Discussed with Dr. Corliss Skains, plan for diagnostic cerebral angiogram in a.m.  CAD CHF Cardiology on board  EF 30 to 35%, moderate to large pericardial effusion, LVH On aspirin and Brilinta  Hypertensive urgency Hypotension due to drug effect Patient admitted with hypertensive urgency status post IV BP meds 10/14 received BiDil but developed hypotension.  Shortly after code stroke was called due to dizziness,  generalized weakness but more so on the left upper extremity, slight slurred speech and left facial droop and diaphoresis.  Symptoms improved after BP improved.  Bidil discontinued. Currently BP stable Avoid low BP Long term BP goal normotensive  Hyperlipidemia Home meds: Lipitor 40 LDL 91, goal < 70 Now on Lipitor 80 Continue statin at discharge  Tobacco abuse Current smoker Smoking cessation counseling provided Pt is willing to quit  Other Stroke Risk Factors Advanced age CAD  Other Active Problems Breast cancer status post mastectomy  Hospital day # 4   Marvel Plan, MD PhD Stroke Neurology 09/16/2023 3:02 PM    To contact Stroke Continuity provider, please refer to [email or web address]. After hours, contact General Neurology

## 2023-09-17 ENCOUNTER — Other Ambulatory Visit: Payer: Self-pay | Admitting: Nurse Practitioner

## 2023-09-17 ENCOUNTER — Inpatient Hospital Stay (HOSPITAL_COMMUNITY): Payer: Medicare (Managed Care)

## 2023-09-17 ENCOUNTER — Telehealth: Payer: Self-pay | Admitting: Nurse Practitioner

## 2023-09-17 ENCOUNTER — Encounter (HOSPITAL_COMMUNITY): Payer: Self-pay | Admitting: Internal Medicine

## 2023-09-17 DIAGNOSIS — I3139 Other pericardial effusion (noninflammatory): Secondary | ICD-10-CM

## 2023-09-17 DIAGNOSIS — I639 Cerebral infarction, unspecified: Secondary | ICD-10-CM | POA: Diagnosis not present

## 2023-09-17 DIAGNOSIS — I161 Hypertensive emergency: Secondary | ICD-10-CM | POA: Diagnosis not present

## 2023-09-17 DIAGNOSIS — I255 Ischemic cardiomyopathy: Secondary | ICD-10-CM | POA: Diagnosis not present

## 2023-09-17 DIAGNOSIS — I5023 Acute on chronic systolic (congestive) heart failure: Secondary | ICD-10-CM | POA: Diagnosis not present

## 2023-09-17 HISTORY — PX: IR 3D INDEPENDENT WKST: IMG2385

## 2023-09-17 HISTORY — PX: IR ANGIO INTRA EXTRACRAN SEL INTERNAL CAROTID BILAT MOD SED: IMG5363

## 2023-09-17 LAB — RENAL FUNCTION PANEL
Albumin: 3.1 g/dL — ABNORMAL LOW (ref 3.5–5.0)
Anion gap: 15 (ref 5–15)
BUN: 14 mg/dL (ref 8–23)
CO2: 20 mmol/L — ABNORMAL LOW (ref 22–32)
Calcium: 9.7 mg/dL (ref 8.9–10.3)
Chloride: 99 mmol/L (ref 98–111)
Creatinine, Ser: 1.66 mg/dL — ABNORMAL HIGH (ref 0.44–1.00)
GFR, Estimated: 33 mL/min — ABNORMAL LOW (ref >60)
Glucose, Bld: 86 mg/dL (ref 70–99)
Phosphorus: 3.8 mg/dL (ref 2.5–4.6)
Potassium: 4.1 mmol/L (ref 3.5–5.1)
Sodium: 134 mmol/L — ABNORMAL LOW (ref 135–145)

## 2023-09-17 LAB — CBC
HCT: 39.5 % (ref 36.0–46.0)
Hemoglobin: 12.7 g/dL (ref 12.0–15.0)
MCH: 29.6 pg (ref 26.0–34.0)
MCHC: 32.2 g/dL (ref 30.0–36.0)
MCV: 92.1 fL (ref 80.0–100.0)
Platelets: 217 10*3/uL (ref 150–400)
RBC: 4.29 MIL/uL (ref 3.87–5.11)
RDW: 14.6 % (ref 11.5–15.5)
WBC: 4.5 10*3/uL (ref 4.0–10.5)
nRBC: 0 % (ref 0.0–0.2)

## 2023-09-17 LAB — PROTIME-INR
INR: 1.2 (ref 0.8–1.2)
Prothrombin Time: 15 s (ref 11.4–15.2)

## 2023-09-17 LAB — MAGNESIUM: Magnesium: 2.3 mg/dL (ref 1.7–2.4)

## 2023-09-17 MED ORDER — IOHEXOL 300 MG/ML  SOLN
150.0000 mL | Freq: Once | INTRAMUSCULAR | Status: AC | PRN
  Administered 2023-09-17: 65 mL via INTRA_ARTERIAL

## 2023-09-17 MED ORDER — SODIUM CHLORIDE 0.9 % IV SOLN: INTRAVENOUS | Status: AC

## 2023-09-17 MED ORDER — LIDOCAINE HCL 1 % IJ SOLN
10.0000 mL | Freq: Once | INTRAMUSCULAR | Status: AC
  Administered 2023-09-17: 10 mL via INTRADERMAL

## 2023-09-17 MED ORDER — NITROGLYCERIN 1 MG/10 ML FOR IR/CATH LAB
INTRA_ARTERIAL | Status: AC
  Filled 2023-09-17: qty 10

## 2023-09-17 MED ORDER — HEPARIN SODIUM (PORCINE) 1000 UNIT/ML IJ SOLN
INTRAMUSCULAR | Status: AC
  Filled 2023-09-17: qty 10

## 2023-09-17 MED ORDER — VERAPAMIL HCL 2.5 MG/ML IV SOLN
INTRAVENOUS | Status: AC
  Filled 2023-09-17: qty 2

## 2023-09-17 MED ORDER — ADULT MULTIVITAMIN W/MINERALS CH
1.0000 | ORAL_TABLET | Freq: Every day | ORAL | Status: DC
  Administered 2023-09-18: 1 via ORAL
  Filled 2023-09-17 (×2): qty 1

## 2023-09-17 MED ORDER — MIDAZOLAM HCL 2 MG/2ML IJ SOLN
INTRAMUSCULAR | Status: AC
  Filled 2023-09-17: qty 2

## 2023-09-17 MED ORDER — HYDRALAZINE HCL 20 MG/ML IJ SOLN
INTRAMUSCULAR | Status: AC
  Filled 2023-09-17: qty 1

## 2023-09-17 MED ORDER — FENTANYL CITRATE (PF) 100 MCG/2ML IJ SOLN
INTRAMUSCULAR | Status: AC | PRN
Start: 2023-09-17 — End: 2023-09-17
  Administered 2023-09-17: 25 ug via INTRAVENOUS

## 2023-09-17 MED ORDER — FENTANYL CITRATE (PF) 100 MCG/2ML IJ SOLN
INTRAMUSCULAR | Status: AC
  Filled 2023-09-17: qty 2

## 2023-09-17 MED ORDER — LIDOCAINE HCL 1 % IJ SOLN
INTRAMUSCULAR | Status: AC
  Filled 2023-09-17: qty 20

## 2023-09-17 MED ORDER — DIPHENHYDRAMINE HCL 50 MG/ML IJ SOLN
INTRAMUSCULAR | Status: AC
  Filled 2023-09-17: qty 1

## 2023-09-17 MED ORDER — DIPHENHYDRAMINE HCL 50 MG/ML IJ SOLN
INTRAMUSCULAR | Status: AC | PRN
Start: 2023-09-17 — End: 2023-09-17
  Administered 2023-09-17: 25 mg via INTRAVENOUS

## 2023-09-17 MED ORDER — SODIUM CHLORIDE 0.9 % IV SOLN
INTRAVENOUS | Status: AC | PRN
Start: 2023-09-17 — End: 2023-09-17
  Administered 2023-09-17: 250 mL via INTRAVENOUS

## 2023-09-17 MED ORDER — ENSURE ENLIVE PO LIQD
237.0000 mL | Freq: Three times a day (TID) | ORAL | Status: DC
  Administered 2023-09-18: 237 mL via ORAL

## 2023-09-17 MED ORDER — HEPARIN SODIUM (PORCINE) 1000 UNIT/ML IJ SOLN
INTRAMUSCULAR | Status: AC | PRN
Start: 2023-09-17 — End: 2023-09-17
  Administered 2023-09-17: 1000 [IU] via INTRAVENOUS

## 2023-09-17 MED ORDER — HYDRALAZINE HCL 20 MG/ML IJ SOLN
INTRAMUSCULAR | Status: AC | PRN
  Administered 2023-09-17 (×2): 5 mg via INTRAVENOUS

## 2023-09-17 NOTE — Progress Notes (Signed)
Heart Failure Nurse Navigator Progress Note  PCP: Rana Snare, DO PCP-Cardiologist: Branch Admission Diagnosis: Hypertensive Emergency, Acute on Chronic Congestive Heart Failure, Pneumonia, Diverticulitis Admitted from: Home  Presentation:   Amy Howell presented with worsening shortness of breath & diarrhea. History of uncontrolled hypertension, CHF, Diabetes Mellitus Type 2. New CVA this admission. BNP 2632.8. Right MCA Stent in IR today. Pt is reporting weight loss and no appetite over the last couple of months.  ECHO/ LVEF: 30-35%  Clinical Course:  Past Medical History:  Diagnosis Date   Cataracts, bilateral    Cerebral infarction due to carotid artery stenosis (HCC)    Diabetes mellitus type 2 in nonobese (HCC)    Glaucoma    Hypertension    Hypertensive emergency 11/22/2022   Stroke (HCC)    no residual, "series of mini strokes"   Tobacco abuse    Vertigo      Social History   Socioeconomic History   Marital status: Single    Spouse name: Not on file   Number of children: 2   Years of education: Not on file   Highest education level: 10th grade  Occupational History   Occupation: retired  Tobacco Use   Smoking status: Former    Current packs/day: 0.50    Average packs/day: 0.5 packs/day for 56.0 years (28.0 ttl pk-yrs)    Types: Cigarettes   Smokeless tobacco: Never  Vaping Use   Vaping status: Never Used  Substance and Sexual Activity   Alcohol use: No   Drug use: No   Sexual activity: Not Currently    Birth control/protection: Surgical    Comment: Hyst  Other Topics Concern   Not on file  Social History Narrative   Not on file   Social Determinants of Health   Financial Resource Strain: Low Risk  (09/17/2023)   Overall Financial Resource Strain (CARDIA)    Difficulty of Paying Living Expenses: Not hard at all  Food Insecurity: No Food Insecurity (09/13/2023)   Hunger Vital Sign    Worried About Running Out of Food in the Last Year: Never  true    Ran Out of Food in the Last Year: Never true  Transportation Needs: No Transportation Needs (09/17/2023)   PRAPARE - Administrator, Civil Service (Medical): No    Lack of Transportation (Non-Medical): No  Physical Activity: Insufficiently Active (04/08/2023)   Exercise Vital Sign    Days of Exercise per Week: 3 days    Minutes of Exercise per Session: 30 min  Stress: No Stress Concern Present (04/08/2023)   [employer] of Occupational Health - Occupational Stress Questionnaire    Feeling of Stress : Not at all  Social Connections: Socially Isolated (04/08/2023)   Social Connection and Isolation Panel [NHANES]    Frequency of Communication with Friends and Family: Three times a week    Frequency of Social Gatherings with Friends and Family: Twice a week    Attends Religious Services: Never    Database administrator or Organizations: No    Attends Engineer, structural: Never    Marital Status: Never married   Education Assessment and Provision:  Detailed education and instructions provided on heart failure disease management including the following:  Signs and symptoms of Heart Failure When to call the physician Importance of daily weights Low sodium diet Fluid restriction Medication management Anticipated future follow-up appointments  Patient education given on each of the above topics.  Patient acknowledges understanding via teach back  method and acceptance of all instructions.  Education Materials:  "Living Better With Heart Failure" Booklet, HF zone tool, & Daily Weight Tracker Tool.  Patient has scale at home: Yes Patient has pill box at home: Yes-Her daughter fills them for her.    High Risk Criteria for Readmission and/or Poor Patient Outcomes: Heart failure hospital admissions (last 6 months): 0  No Show rate: 5 Difficult social situation: No-lives with daughter Demonstrates medication adherence: Non-Compliant Primary Language:  English Literacy level: Reading, Writing & Comprehension  Barriers of Care:   Diet & Fluid Restrictions Daily Weights Medication Compliance  Considerations/Referrals:   Referral made to Heart Failure Pharmacist Stewardship: Yes Referral made to Heart Failure CSW/NCM TOC: No Referral made to Heart & Vascular TOC clinic: Yes -09/24/23 @1 :30 pm  Items for Follow-up on DC/TOC: Diet & Fluid Restrictions Daily Weights Medication Compliance Continued Heart Failure Education.  TOC appointment scheduled for 09/24/23.    Roxy Horseman, RN, BSN [facility] Heart Failure Navigator Secure Chat Only

## 2023-09-17 NOTE — Progress Notes (Deleted)
Pharmacy has been attempting to do a drug reconciliation as patient admitted flushing some pills down the drain. She was not taking any hydrochlorothiazide, spironolactone, or atorvastatin. She will remain on Atrorvastatin 40mg  daily.   Will not be starting [place] today, she never started it.   Charnika Herbst Alexander-Savino,MD  PGY-1

## 2023-09-17 NOTE — Procedures (Signed)
INR.  Status post right common carotid arteriogram.    Right CFA approach.  Findings.  1.  Patent right MCA stent.  2.  5 mm x 3.1 mm x 4 mm right MCA trunk saccular aneurysm.  3.  2.4 mm x 2.5 mm saccular aneurysm at the origin of the inferior division of the right MCA.   4. 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  Fatima Sanger MD

## 2023-09-17 NOTE — Progress Notes (Signed)
Progress Note  Patient Name: Amy Howell Date of Encounter: 09/17/2023  [facility] Cardiologist: Maisie Fus, MD   Subjective   Patient has a history of breast cancer status post right mastectomy but did not receive radiation or chemo.  Now presenting with shortness of breath x 1 month with weight loss and a BNP greater than 2000 with bilateral pleural effusions and large pericardial effusion with no evidence of tamponade.  EF 30 to 35%.  Suffered acute CVA 10/14 and MRI showed new acute CVA in right hemisphere related to in-stent narrowing of the right MCA noted on CT angio.  She has been transitioned to Brilinta 90mg  BID.  Symptoms of LUE weakness and facial droop with slurred speech have resolved.  Just got back from IR  for cerebral angio and found to have patent MCA stent but progression of right MCA aneurysm with recommendations to gain weight first and then followup with Dr. Corliss Skains for outpt treatment  No CP or SOB  Inpatient Medications    Scheduled Meds:  aspirin  81 mg Oral Daily   atorvastatin  40 mg Oral Daily   azithromycin  250 mg Oral Daily   dorzolamide  1 drop Both Eyes BID   feeding supplement  237 mL Oral Q24H   latanoprost  1 drop Both Eyes QHS   ticagrelor  90 mg Oral BID   timolol  1 drop Both Eyes q morning   Continuous Infusions:  PRN Meds: acetaminophen, alum & mag hydroxide-simeth, prochlorperazine   Vital Signs    Vitals:   09/16/23 2329 09/17/23 0538 09/17/23 0542 09/17/23 0756  BP: (!) 143/66  (!) 168/76 129/70  Pulse: 87  78 80  Resp: 18   17  Temp: 98.4 F (36.9 C)  (!) 97.5 F (36.4 C) 97.8 F (36.6 C)  TempSrc: Oral  Oral Oral  SpO2: 100%  98% 100%  Weight:  40.6 kg    Height:        Intake/Output Summary (Last 24 hours) at 09/17/2023 0810 Last data filed at 09/16/2023 1900 Gross per 24 hour  Intake 2120 ml  Output --  Net 2120 ml      09/17/2023    5:38 AM 09/15/2023    4:45 AM 09/14/2023    4:42 AM   Last 3 Weights  Weight (lbs) 89 lb 8.1 oz 88 lb 6.4 oz 86 lb 10.3 oz  Weight (kg) 40.6 kg 40.098 kg 39.3 kg      Telemetry    NSR Personally Reviewed  ECG    No new EKG to review- Personally Reviewed  Physical Exam   GEN: Well nourished, well developed in no acute distress HEENT: Normal NECK: No JVD; No carotid bruits LYMPHATICS: No lymphadenopathy CARDIAC:RRR, no murmurs, rubs, gallops RESPIRATORY:  Clear to auscultation without rales, wheezing or rhonchi  ABDOMEN: Soft, non-tender, non-distended MUSCULOSKELETAL:  No edema; No deformity  SKIN: Warm and dry NEUROLOGIC:  Alert and oriented x 3 PSYCHIATRIC:  Normal affect  Labs    High Sensitivity Troponin:   Recent Labs  Lab 09/12/23 1249 09/12/23 1629 09/13/23 0759 09/13/23 1259  TROPONINIHS 7 14 22* 18*      Chemistry Recent Labs  Lab 09/12/23 1249 09/12/23 1726 09/15/23 0332 09/15/23 1545 09/16/23 0451 09/17/23 0539  NA 137   < > 131*  --  132* 134*  K 4.3   < > 5.7* 4.7 4.5 4.1  CL 106   < > 102  --  100 99  CO2 18*   < > 18*  --  17* 20*  GLUCOSE 111*   < > 79  --  69* 86  BUN 22   < > 17  --  18 14  CREATININE 1.22*   < > 1.39*  --  1.80* 1.66*  CALCIUM 9.1   < > 9.1  --  9.4 9.7  PROT 6.9  --   --   --   --   --   ALBUMIN 3.3*  --  2.9*  --   --  3.1*  AST 55*  --   --   --   --   --   ALT 67*  --   --   --   --   --   ALKPHOS 132*  --   --   --   --   --   BILITOT 0.9  --   --   --   --   --   GFRNONAA 48*   < > 41*  --  30* 33*  ANIONGAP 13   < > 11  --  15 15   < > = values in this interval not displayed.     Hematology Recent Labs  Lab 09/15/23 0332 09/16/23 0451 09/17/23 0539  WBC 6.8 6.2 4.5  RBC 4.08 3.96 4.29  HGB 12.1 11.5* 12.7  HCT 38.0 36.2 39.5  MCV 93.1 91.4 92.1  MCH 29.7 29.0 29.6  MCHC 31.8 31.8 32.2  RDW 14.6 14.6 14.6  PLT 182 202 217    BNP Recent Labs  Lab 09/12/23 1249  BNP 2,632.8*     DDimer No results for input(s): "DDIMER" in the last 168  hours.  Radiology    MR BRAIN WO CONTRAST  Result Date: 09/15/2023 CLINICAL DATA:  Neuro deficit, acute, stroke suspected. EXAM: MRI HEAD WITHOUT CONTRAST TECHNIQUE: Multiplanar, multiecho pulse sequences of the brain and surrounding structures were obtained without intravenous contrast. COMPARISON:  CT studies same day.  MRI 03/27/2017. FINDINGS: Brain: Diffusion imaging shows an acute 6 x 8 mm infarction in the subcortical white matter of the right posterior frontal lobe. No other acute insult. Chronic small-vessel ischemic changes affect the pons. 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 Old bilateral thalamic lacunar infarctions. Few old small vessel infarctions of the basal ganglia. Widespread chronic small-vessel ischemic changes of the cerebral hemispheric deep and subcortical white matter. No mass, hemorrhage, hydrocephalus or extra-axial collection. Few scattered punctate foci of hemosiderin deposition related to old small vessel infarctions. Vascular: Major vessels at the base of the brain show flow. Skull and upper cervical spine: Negative Sinuses/Orbits: Clear/normal Other: None IMPRESSION: 1. 6 x 8 mm acute infarction in the subcortical white matter of the right posterior frontal lobe. No swelling or hemorrhage. 2. Extensive chronic small-vessel ischemic changes elsewhere throughout the brain as outlined above. 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 Electronically Signed   By: Paulina Fusi M.D.   On: 09/15/2023 16:09   CT ANGIO HEAD NECK W WO CM (CODE STROKE)  Result Date: 09/15/2023 CLINICAL DATA:  Code stroke.  Neuro deficit, acute stroke suspected. EXAM: CT ANGIOGRAPHY HEAD AND NECK WITH AND WITHOUT CONTRAST TECHNIQUE: Multidetector CT imaging of the head and neck was performed using the standard protocol during bolus  administration of intravenous contrast. Multiplanar CT image reconstructions and MIPs were obtained to evaluate  the vascular anatomy. Carotid stenosis measurements (when applicable) are obtained utilizing NASCET criteria, using the distal internal carotid diameter as the denominator. RADIATION DOSE REDUCTION: This exam was performed according to the departmental dose-optimization program which includes automated exposure control, adjustment of the mA and/or kV according to patient size and/or use of iterative reconstruction technique. CONTRAST:  75mL OMNIPAQUE IOHEXOL 350 MG/ML SOLN COMPARISON:  CT head without contrast 09/15/2023 FINDINGS: CTA NECK FINDINGS Aortic arch: Extensive atherosclerotic changes are present at the aortic arch. The great vessel origins are not imaged. No focal stenosis or aneurysm is visualized. Right carotid system: Mild wall irregularity is present in the distal right common carotid artery without significant stenosis. Atherosclerotic calcifications are present at the right carotid bifurcation without significant stenosis. The cervical right ICA is otherwise normal. Left carotid system: The left common carotid artery is within normal limits. Minimal atherosclerotic calcifications are present at the bifurcation without significant stenosis. The cervical left ICA is normal. Vertebral arteries: The left vertebral artery is the dominant vessel. Both vertebral arteries originate from the subclavian arteries without significant stenosis. No significant stenosis is present in either vertebral artery in the neck. Skeleton: Mild degenerative changes are present most evident at C5-6. Vertebral body heights are maintained. No focal osseous lesions are present. Other neck: Soft tissues the neck are otherwise unremarkable. Salivary glands are within normal limits. Thyroid is normal. No significant adenopathy is present. No focal mucosal or submucosal lesions are present. Upper chest: Centrilobular  emphysematous changes are present. No nodule or mass lesion is present. The thoracic inlet is within normal limits. Review of the MIP images confirms the above findings CTA HEAD FINDINGS Anterior circulation: Atherosclerotic calcifications are present within the cavernous internal carotid arteries bilaterally without significant stenosis. The left ICA terminus is within normal limits. Overlapping stents extend from the right ICA terminus into a right M2 branch. High-grade M2 stenoses are present just distal to the stent with asymmetric attenuation of right MCA branches. No residual aneurysm is present. Distal segmental narrowing is present in the ACA branch vessels and right MCA branch vessels. No other significant proximal stenosis or occlusion is present. Posterior circulation: The left vertebral artery is dominant vessel. The PICA origins are visualized and normal. Atherosclerotic changes are present at the vertebrobasilar junction with a high-grade proximal basilar artery stenosis. The more distal basilar artery is attenuated. Both posterior cerebral arteries originate from basilar tip. High-grade stenosis is present in proximal right P1 segment. Moderate narrowing is present in the inferior P3 segments bilaterally. Venous sinuses: The dural sinuses are patent. The straight sinus and deep cerebral veins are intact. Cortical veins are within normal limits. No significant vascular malformation is evident. Anatomic variants: None Review of the MIP images confirms the above findings IMPRESSION: 1. Overlapping stents extend from the right ICA terminus into a right M2 branch. 2. High-grade M2 stenoses just distal to the stent with asymmetric attenuation of right MCA branches. This may result in decreased cerebral blood flow to portions of the right MCA territory. 3. No residual aneurysm. 4. Distal segmental narrowing of the ACA branch vessels and right MCA branch vessels. 5. High-grade stenosis of the proximal basilar  artery. 6. High-grade stenosis of the proximal right P1 segment. 7. Moderate narrowing of the inferior P3 segments bilaterally. 8. Aortic Atherosclerosis (ICD10-I70.0) and Emphysema (ICD10-J43.9). These results were called by telephone at the time of interpretation on 09/15/2023 at 1:00 pm to provider [facility] , who verbally acknowledged these results. Electronically Signed  By: Marin Roberts M.D.   On: 09/15/2023 13:00   CT HEAD CODE STROKE WO CONTRAST`  Result Date: 09/15/2023 CLINICAL DATA:  Code stroke.  Mental status change, unknown cause EXAM: CT HEAD WITHOUT CONTRAST TECHNIQUE: Contiguous axial images were obtained from the base of the skull through the vertex without intravenous contrast. RADIATION DOSE REDUCTION: This exam was performed according to the departmental dose-optimization program which includes automated exposure control, adjustment of the mA and/or kV according to patient size and/or use of iterative reconstruction technique. COMPARISON:  None Available. FINDINGS: Brain: No evidence of acute large vascular territory infarction, hemorrhage, hydrocephalus, extra-axial collection or mass lesion/mass effect. Remote appearing right PCA territory infarct is expanded compared to the prior. Remote left cerebellar infarct. Vascular: Right MCA stent. Skull: No acute fracture. Sinuses/Orbits: Clear sinuses.  No acute orbital findings. ASPECTS [facility] Stroke Program Early CT Score) total score (0-10 with 10 being normal): 10. IMPRESSION: 1. No definite evidence of acute large vascular territory infarct or acute hemorrhage. 2. Remote appearing right PCA territory infarct appears expanded compared to the prior. An MRI could better assess for acute peri-infarct ischemia if clinically warranted. 3. Remote left PCA territory infarct. These results will be called to the ordering clinician or representative by the Radiologist Assistant, and communication documented in the PACS or [employer]. Electronically Signed   By: Feliberto Harts M.D.   On: 09/15/2023 12:37    Patient Profile     66 y.o. female  with a hx of carotid artery stenosis, hypertension, vertigo, CAD, DM2, brain aneurysm with stent placed, CVA, right breast cancer s/p mastectomy and did not receive radiation or chemotherapy, tobacco abuse,, and angioedema with lisinopril who is being seen 09/13/2023 for the evaluation of pericardial effusion at the request of Dr. Antony Contras.   Assessment & Plan    Moderate-Large Pericardial effusion -concern for malignant effusion in the differential as she also has bilateral pleural effusions -patient declined thoracentesis  -no signs of tamponade currently clinically.  There is some subtle RV collapse during diastole on echo however the IVC is normal on her echo -repeat echo 10/13 with improvement in effusion with normal IVC dimensions and normal respirophasic change in MV inflow.  No evidence of tamponade -plan to repeat outpt echo in 1 week   CAD -DES-RCA 11/2022 for an NSTEMI -remains on DAPT with ASA and brilinta -Previously okayed for 90 mg Brilinta twice daily, however patient changed to 45 mg Brilinta twice daily -at cath in 11/2022 she was supposed to change from low dose Brilinta to Plavix 75mg  daily>>she is not protected on Brilinta 45mg  BID>>initially was going to change to Plavix 75mg  daily for better compliance but given acute CVA she has been change to Brilinta 90mg  BID -continue ASA 81mg  daily and Brilinta 90mg  BID -Carvedilol has been stopped to allow for permissive HTN in setting of acute CVA -continue Atorvastatin 80mg  daily   Chronic systolic heart failure Nonischemic cardiomyopathy/ischemic cardiomyopathy EF 30-35%. RV function is mildly reduced. Mild- Mod MR Thought 2/2 CAD and hypertension - remains euvolemic on exam - Not a candidate for Entresto given angioedema with ACE inhibitors - attempted addition of Bidil for GMDT and HTN but SBP  dropped into the 80's shortly after taking it.  Tolerated IV NTG gtt 2 days ago when she presented in setting of hypertensive urgency - Carvedilol and Jardiance stopped due to acute CVA and need for permissive HTN - Cleda Daub stopped due to acute hyperkalemia - BP stable at 129/6mmHG  today - will hold on GDMT for now and allow her to recover from CVA>>can restart outpt   HTN -stopped hydrochlorothiazide  -Carvedilol, Jardiance and Spiro on hold to allow for permissive HTN in setting of acute CVA -no ACE/ARB due to hx of angioedema -tried Bidil but developed Hypotension and had to be stopped  Acute CVA -code stroke was called 10/14 for LUE weakness and left facial droop, as well as slurred speech.  -CT angiography head and neck with concern for narrowing at the intracranial stent in the right MCA.  -Stat MRI was done that showed a small stroke in the right hemisphere, likely related to the in-stent narrowing -now on Brilinta 90mg  BID and ASA 81mg  daily.  -s/p cerebral angio by Neuro IR today showing  patent MCA stent but progression of right MCA aneurysm with recommendations to gain weight first and then followup with Dr. Corliss Skains for outpt treatment  [facility] will sign off.   Medication Recommendations:  ASA 81mg  daily, Brilinta 90mg  BID, Atorvastatin 40mg  daily Other recommendations (labs, testing, etc):  2D echo in 1 week Follow up as an outpatient:  Carolan Clines, MD in 2 weeks   Total time spent with patient today 35 minutes. This includes reviewing records, evaluating the patient and coordinating care. Face-to-face time >50%.    For questions or updates, please contact [facility] Please consult [email or web address] for contact info under        Signed, Armanda Magic, MD  09/17/2023, 8:10 AM

## 2023-09-17 NOTE — Telephone Encounter (Signed)
Transition of Care Follow-up Phone Call Request    Patient Name: Amy Howell Date of Birth: Apr 01, 1956 Date of Encounter: 09/17/2023  Primary Care Provider:  Rana Snare, DO Primary Cardiologist:  Maisie Fus, MD  Truman Hayward will be discharged from [facility] within the next few days.  She was seen by Dr. Tereso Newcomer, during her hospitalization.  I have ordered a limited 2D echo to be performed within the next 7-10 days to follow-up a pericardial effusion.  In addition to contacting the pt to schedule the echo, please also arrange a follow-up appointment with Dr. Tereso Newcomer, or APP on her team, within the next 2 wks.  Nicolasa Ducking, NP  09/17/2023, 8:10 PM

## 2023-09-17 NOTE — Progress Notes (Signed)
Heart Failure Navigator Progress Note  Following this hospitalization to assess for HV TOC readiness.   Patient currently in IR.  Will plan to interview this afternoon. EF 30-35%, New CVA.   Rhae Hammock, BSN, Scientist, clinical (histocompatibility and immunogenetics) Only

## 2023-09-17 NOTE — Progress Notes (Addendum)
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   Overnight: NAEO  Subjective: her procedure went well. She is hungry. Feels that there is a warmth sensation down her neck, started after the procedure.   Objective:   Vital signs in last 24 hours: Vitals:   09/16/23 2329 09/17/23 0538 09/17/23 0542 09/17/23 0756  BP: (!) 143/66  (!) 168/76 129/70  Pulse: 87  78 80  Resp: 18   17  Temp: 98.4 F (36.9 C)  (!) 97.5 F (36.4 C) 97.8 F (36.6 C)  TempSrc: Oral  Oral Oral  SpO2: 100%  98% 100%  Weight:  40.6 kg    Height:       Supplemental O2: Room Air Last BM Date : 09/16/23 SpO2: 100 % FiO2 (%): 30 % Filed Weights   09/14/23 0442 09/15/23 0445 09/17/23 0538  Weight: 39.3 kg 40.1 kg 40.6 kg    Intake/Output Summary (Last 24 hours) at 09/17/2023 5638 Last data filed at 09/16/2023 1900 Gross per 24 hour  Intake 1800 ml  Output --  Net 1800 ml   Net IO Since Admission: 2,285.3 mL [09/17/23 0903]  Physical Exam General: NAD, cachetic appearance HENT: NCAT Lungs: Saturating well at RA Cardiovascular: RRR, no lower extremity edema, no JVD Abdomen: No TTP, bowel sounds present MSK: no asymmetry Skin: no lesions noted.  Neuro: alert and oriented,  Psych: normal mood and normal affect  Diagnostics    Latest Ref Rng & Units 09/17/2023    5:39 AM 09/16/2023    4:51 AM 09/15/2023    3:32 AM  CBC  WBC 4.0 - 10.5 K/uL 4.5  6.2  6.8   Hemoglobin 12.0 - 15.0 g/dL 75.6  43.3  29.5   Hematocrit 36.0 - 46.0 % 39.5  36.2  38.0   Platelets 150 - 400 K/uL 217  202  182        Latest Ref Rng & Units 09/17/2023    5:39 AM 09/16/2023    4:51 AM 09/15/2023    3:45 PM  BMP  Glucose 70 - 99 mg/dL 86  69    BUN 8 - 23 mg/dL 14  18    Creatinine 1.88 - 1.00 mg/dL 4.16  6.06    Sodium 301 - 145 mmol/L 134   132    Potassium 3.5 - 5.1 mmol/L 4.1  4.5  4.7   Chloride 98 - 111 mmol/L 99  100    CO2 22 - 32 mmol/L 20  17    Calcium 8.9 - 10.3 mg/dL 9.7  9.4       Assessment/Plan: Principal Problem:   Acute on chronic HFrEF (heart failure with reduced ejection fraction) (HCC) Active Problems:   Hypertension   Lung nodule   Elevated serum creatinine   Diarrhea   Protein-calorie malnutrition, severe (HCC)   Metabolic acidosis   Underweight   Pleural effusion   Acute stroke due to ischemia [facility])   #Stroke R MCA S/p angiography today. No MCA stent stenosis noted. She is neurologically intact today except for some mild sensation abnormalities on the right. Will continue to monitor. Pt will need to take 90mg  BID of Brillinta and ASA at home. She was not taking atorvastatin. Appreciate Neurology's help with this case. -On Brillinta 90mg  BID and ASA. -Atorvastatin 40mg  daily   #Saccular  aneurysms  Found on angiography today.  5 mm x 3.1 mm x 4 mm right MCA trunk saccular aneurysm.,  2.4 mm x 2.5 mm saccular aneurysm at the origin of the inferior division of the right MCA, 2.7 mm x 2.6 mm saccular aneurysm arising from the posterior wall of the left ICA paraclinoid region. These were not treated at this time as patient may need to gain weight. Will need to follow with Dr. Corliss Skains in 1-2 months as outpatient to discuss treatment for this.  -Pt to increase PO intake  -RD consult in place    #HFrEF 40-45% exacerbation - resolved #R>L Pleural Effusion #Moderate-large pleural effusion #Hx of Breast Cancer Event resolved. She is euvolemic on exam. Will need follow up with Cardiology and HF clinic outpatient. She was not taking her spironolactone, Farxiga, or hydrochlorothizide (she would throw the pills down the drain). Appreciate pharmacy's assistance with med rec.  -Low sodium diet -Strict I's and O's -Daily weights- standing only -Cardiology consulted; appreciate their recommendations -Would  need FU with heart failure transition team to aid with medications  -Hold lasix today as patient is euvolemic.  -holding spironolactone 25mg  daily, jardiance 10mg  daily -Holding Coreg in the setting of most recent stroke may benefit from starting Metroprolol XL in the future. -Not a candidate for Entresto given angioedema with ACE inhibitors   #Chronic Diarrhea Is currently on a Z-pak for possible PNA. GI panel negative. Elastase negative. Loose phlegm yellow stools likely inflammatory in nature. Should follow with PCP outpatient and if appropriate see GI.  -continue to trend BMP for GI electrolyte losses  #PNA  Lungs clear to auscultation, no cough. Azithromycin (z-pak) started 10/13. Cw this until completion 10/18.  #HLD c/b CAD s/p stent in 11/2022 On lipitor 40 mg every day, brillinta 90 mg BID and aspirin 81 mg daily. Not taking atorvastatin at home. She should stay on the 40mg  as an increase in dose would not be appropriate.   #Cachexia BMI is currently 17. Hx of Breast Cancer. RD consult placed. Will discuss potential medication for appetite stimulation.   #AKI Cr is improved today at 1.66.  Keep encouraging PO intake.   #HTN Holding spironolactone, farxiga, hydrochlorothiazide, coreg and bidil. Was not taking spironolactone, farxiga, hydrochlorothiazide at home (she was flushing pills). Consider switch to Metoprolol xL in the future for adherence.   #Lung Nodule Incidental finding on chest CT, 5mm right solid pulmonary nodule. As patient has history of cancer and long smoking history, non-con chest CT at 12 months is optional. Will need to FU with PCP.   #Glaucoma Chronic. Home eye drops Timolol and Latanoprost.  Diet: Heart Healthy IVF: None VTE: Lovenox Code: Full Prior to Admission Living Arrangement: Home Anticipated Discharge Location: Home Barriers to Discharge: Medical Stability Dispo: Anticipated discharge in approximately 1 day(s).   Manuela Neptune,  MD  Eligha Bridegroom. [facility] Medicine Residency, PGY-1  Pager: [phone] After 5 pm and on weekends: Please call the on-call pager

## 2023-09-17 NOTE — Progress Notes (Addendum)
Initial Nutrition Assessment  DOCUMENTATION CODES:   Underweight  INTERVENTION:  Liberalize to regular diet in the context of underweight BMI, poor oral intake, and likely malnutrition MVI with minerals daily Ensure Enlive po TID, each supplement provides 350 kcal and 20 grams of protein.  NUTRITION DIAGNOSIS:   Increased nutrient needs related to catabolic illness as evidenced by estimated needs.  GOAL:  Patient will meet greater than or equal to 90% of their needs  MONITOR:  PO intake, Supplement acceptance, I & O's, Labs  REASON FOR ASSESSMENT:  Consult Assessment of nutrition requirement/status  ASSESSMENT:  Pt with hx of CHF, HTN, hx CVA, and breast cancer s/p mastectomy in July 2023 presented to ED with SOB. Found to have CHF exacerbation.  10/11 - admitted 10/16 - right common carotid arteriogram   Pt resting in bed at the time of assessment. States she is hungry but can't sit up until 4PM after having her IR procedure this AM. Assisted with ordering her dinner so it would be ready when she could sit up.   Discussed intake at home. Pt only eating 2 meals a day and drinking some ensure but "not enough." Pt reports that she has noticed significant weight loss since December/January and has been unable to keep any weight on. Reports poor appetite since her mastectomy surgery. Also endorses that she is a picky eater.  Pt unable to move or reposition herself at this time due to bedrest order, will defer physical exam to follow-up but high suspicion for malnutrition   Admit weight: 40.8 Current weight: 40.6 kg    Average Meal Intake: 10/12-10/15: 100% intake x 3 recorded meals  Nutritionally Relevant Medications: Scheduled Meds:  atorvastatin  40 mg Oral Daily   azithromycin  250 mg Oral Daily   feeding supplement  237 mL Oral TID BM   Continuous Infusions:  sodium chloride 75 mL/hr at 09/17/23 1200   Labs Reviewed: Na 134 Creatinine 1.66 HgbA1c  5.6%  NUTRITION - FOCUSED PHYSICAL EXAM: Defer to follow-up, bedrest order  Diet Order:   Diet Order             Diet regular Room service appropriate? Yes with Assist; Fluid consistency: Thin  Diet effective now                   EDUCATION NEEDS:  Education needs have been addressed  Skin:  Skin Assessment: Reviewed RN Assessment  Last BM:  10/15  Height:  Ht Readings from Last 1 Encounters:  09/13/23 5' (1.524 m)    Weight:  Wt Readings from Last 1 Encounters:  09/17/23 40.6 kg    Ideal Body Weight:  45.5 kg  BMI:  Body mass index is 17.48 kg/m.  Estimated Nutritional Needs:  Kcal:  1400-1600 kcal/d Protein:  70-90g/d Fluid:  1.5-1.8L/d   Greig Castilla, RD, LDN Clinical Dietitian RD pager # available in [facility]  After hours/weekend pager # available in [facility]

## 2023-09-17 NOTE — Progress Notes (Signed)
   Heart Failure Stewardship Pharmacist Progress Note   PCP: Rana Snare, DO PCP-Cardiologist: Maisie Fus, MD    HPI:  67 yo F with PMH of CHF, HTN, T2DM, brain aneurysm with stent placed, CVA, CAD/PCI, breast cancer s/p R mastectomy.   Admitted 11/2022 with increased shortness of breath in the setting of acute HFrEF. Cath showed 75% stenosis of mid RCA s/p PCI with DES. EF 30-35%. Hospitalization complicated by AKI. She was started on losartan, carvedilol, and spironolactone. Later, the losartan was stopped due to headache.   ECHO 01/2023 with EF 40-45%.   Presented to the ED on 10/11 with shortness of breath and diarrhea. Also complaining of orthopnea and unintentional weight loss. CXR with enlarged heart and vascular congestion with interstitial edema. BNP 2632. CTA negative for PE. ECHO 10/12 showed LVEF 30-35%, moderate-large pericardial effusion (no evidence of tamponade on repeat ECHO 10/13), mild-moderate MR. Refused thoracentesis on 10/13. Code stroke was called on 10/14 with LUE weakness and left facial droop with slurred speech. CT angiography head and neck with concern for narrowing at the intracranial stent in the right MCA. Stat MRI was done that shows a small stroke in the right hemisphere, likely related to the in-stent narrowing. TNK not used due to mild symptoms with rapid improvement.   Current HF Medications: None  Prior to admission HF Medications: Beta blocker: carvedilol 6.25 mg BID MRA: spironolactone 25 mg daily *also on hydrochlorothiazide 12.5 mg daily  Pertinent Lab Values: Serum creatinine 1.39>1.80>1.66, BUN 14, Potassium 4.1, Sodium 134, BNP 2632.8, Magnesium 2.3  Vital Signs: Weight: 89 lbs (admission weight: 90 lbs) Blood pressure: 130-160/70s  Heart rate: 70-80s  I/O: incomplete  Medication Assistance / Insurance Benefits Check: Does the patient have prescription insurance?  Yes Type of insurance plan: [employer]  Outpatient Pharmacy:   Prior to admission outpatient pharmacy: [facility] OP Is the patient willing to use [facility] TOC pharmacy at discharge? Yes    Assessment: 1. Acute on chronic systolic CHF (LVEF 30-35%). NYHA class II symptoms. - Off IV lasix, volume improved. Strict I/Os and daily weights. Keep K>4 and Mg>2.  - History of only taking carvedilol once daily instead of BID. May need to transition to metoprolol XL at discharge (currently holding BB for permissive HTN) - History of angioedema on ACE-I. Intolerant to losartan 2/2 headaches.  - Holding spironolactone and Jardiance with AKI, improving - Holding BiDil with hypotensive episode on 10/14 - Consider discontinuing hydrochlorothiazide on discharge  Plan: 1) Medication changes recommended at this time: - may need to adjust carvedilol to metoprolol XL at discharge for compliance concern - discontinue hydrochlorothiazide on discharge  2) Patient assistance: - BiDil copay $47 - Jardiance copay $47  3)  Education  - To be completed prior to discharge  Sharen Hones, PharmD, BCPS Heart Failure Stewardship Pharmacist Phone [phone]

## 2023-09-17 NOTE — Plan of Care (Signed)

## 2023-09-17 NOTE — Progress Notes (Signed)
STROKE TEAM PROGRESS NOTE   SUBJECTIVE (INTERVAL HISTORY) No family is at the bedside. She had angiogram with Dr. 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 She needs to gain some weight first and follow up with Dr. Corliss Skains as outpt.     OBJECTIVE Temp:  [97.5 F (36.4 C)-98.8 F (37.1 C)] 98.3 F (36.8 C) (10/16 1522) Pulse Rate:  [69-97] 92 (10/16 1522) Cardiac Rhythm: Normal sinus rhythm (10/16 1115) Resp:  [14-20] 17 (10/16 1522) BP: (114-202)/(49-82) 138/63 (10/16 1522) SpO2:  [98 %-100 %] 100 % (10/16 1522) Weight:  [40.6 kg] 40.6 kg (10/16 0538)  Recent Labs  Lab 09/15/23 1209  GLUCAP 99   Recent Labs  Lab 09/13/23 0759 09/13/23 1259 09/14/23 0352 09/15/23 0332 09/15/23 1545 09/16/23 0451 09/17/23 0539  NA  --  135 133* 131*  --  132* 134*  K  --  3.1* 4.6 5.7* 4.7 4.5 4.1  CL  --  101 101 102  --  100 99  CO2  --  22 21* 18*  --  17* 20*  GLUCOSE  --  114* 73 79  --  69* 86  BUN  --  16 15 17   --  18 14  CREATININE  --  1.26* 1.36* 1.39*  --  1.80* 1.66*  CALCIUM  --  8.2* 8.6* 9.1  --  9.4 9.7  MG 1.6*  --   --   --   --   --  2.3  PHOS  --   --   --  2.8  --   --  3.8   Recent Labs  Lab 09/12/23 1249 09/15/23 0332 09/17/23 0539  AST 55*  --   --   ALT 67*  --   --   ALKPHOS 132*  --   --   BILITOT 0.9  --   --   PROT 6.9  --   --   ALBUMIN 3.3* 2.9* 3.1*   Recent Labs  Lab 09/12/23 1249 09/12/23 1726 09/14/23 0752 09/15/23 0332 09/16/23 0451 09/17/23 0539  WBC 6.2  --  8.2 6.8 6.2 4.5  NEUTROABS 4.6  --  6.5  --  4.7  --   HGB 12.8 13.6 12.2 12.1 11.5* 12.7  HCT 42.6 40.0 38.7 38.0 36.2 39.5  MCV 97.9  --  91.3 93.1 91.4 92.1  PLT 195  --  181 182 202 217   No results for input(s): "CKTOTAL", "CKMB", "CKMBINDEX", "TROPONINI" in the last 168 hours. Recent Labs    09/17/23 0806  LABPROT 15.0  INR 1.2   No results for input(s): "COLORURINE", "LABSPEC",  "PHURINE", "GLUCOSEU", "HGBUR", "BILIRUBINUR", "KETONESUR", "PROTEINUR", "UROBILINOGEN", "NITRITE", "LEUKOCYTESUR" in the last 72 hours.  Invalid input(s): "APPERANCEUR"     Component Value Date/Time   CHOL 141 09/16/2023 0451   CHOL 176 06/30/2023 0805   TRIG 90 09/16/2023 0451   HDL 32 (L) 09/16/2023 0451   HDL 50 06/30/2023 0805   CHOLHDL 4.4 09/16/2023 0451   VLDL 18 09/16/2023 0451   LDLCALC 91 09/16/2023 0451   LDLCALC 110 (H) 06/30/2023 0805   Lab Results  Component Value Date   HGBA1C 5.6 09/16/2023      I have personally reviewed the radiological images below and agree with the radiology interpretations.  MR BRAIN WO CONTRAST  Result Date: 09/15/2023 CLINICAL DATA:  Neuro deficit, acute, stroke suspected. EXAM: MRI HEAD WITHOUT CONTRAST TECHNIQUE: Multiplanar, multiecho  pulse sequences of the brain and surrounding structures were obtained without intravenous contrast. COMPARISON:  CT studies same day.  MRI 03/27/2017. FINDINGS: Brain: Diffusion imaging shows an acute 6 x 8 mm infarction in the subcortical white matter of the right posterior frontal lobe. No other acute insult. Chronic small-vessel ischemic changes affect the pons. 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 Old bilateral thalamic lacunar infarctions. Few old small vessel infarctions of the basal ganglia. Widespread chronic small-vessel ischemic changes of the cerebral hemispheric deep and subcortical white matter. No mass, hemorrhage, hydrocephalus or extra-axial collection. Few scattered punctate foci of hemosiderin deposition related to old small vessel infarctions. Vascular: Major vessels at the base of the brain show flow. Skull and upper cervical spine: Negative Sinuses/Orbits: Clear/normal Other: None IMPRESSION: 1. 6 x 8 mm acute infarction in the subcortical white matter of the right posterior  frontal lobe. No swelling or hemorrhage. 2. Extensive chronic small-vessel ischemic changes elsewhere throughout the brain as outlined above. 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 Electronically Signed   By: Paulina Fusi M.D.   On: 09/15/2023 16:09   CT ANGIO HEAD NECK W WO CM (CODE STROKE)  Result Date: 09/15/2023 CLINICAL DATA:  Code stroke.  Neuro deficit, acute stroke suspected. EXAM: CT ANGIOGRAPHY HEAD AND NECK WITH AND WITHOUT CONTRAST TECHNIQUE: Multidetector CT imaging of the head and neck was performed using the standard protocol during bolus administration of intravenous contrast. Multiplanar CT image reconstructions and MIPs were obtained to evaluate the vascular anatomy. Carotid stenosis measurements (when applicable) are obtained utilizing NASCET criteria, using the distal internal carotid diameter as the denominator. RADIATION DOSE REDUCTION: This exam was performed according to the departmental dose-optimization program which includes automated exposure control, adjustment of the mA and/or kV according to patient size and/or use of iterative reconstruction technique. CONTRAST:  75mL OMNIPAQUE IOHEXOL 350 MG/ML SOLN COMPARISON:  CT head without contrast 09/15/2023 FINDINGS: CTA NECK FINDINGS Aortic arch: Extensive atherosclerotic changes are present at the aortic arch. The great vessel origins are not imaged. No focal stenosis or aneurysm is visualized. Right carotid system: Mild wall irregularity is present in the distal right common carotid artery without significant stenosis. Atherosclerotic calcifications are present at the right carotid bifurcation without significant stenosis. The cervical right ICA is otherwise normal. Left carotid system: The left common carotid artery is within normal limits. Minimal atherosclerotic calcifications are present at the bifurcation without significant stenosis. The cervical left ICA is normal. Vertebral  arteries: The left vertebral artery is the dominant vessel. Both vertebral arteries originate from the subclavian arteries without significant stenosis. No significant stenosis is present in either vertebral artery in the neck. Skeleton: Mild degenerative changes are present most evident at C5-6. Vertebral body heights are maintained. No focal osseous lesions are present. Other neck: Soft tissues the neck are otherwise unremarkable. Salivary glands are within normal limits. Thyroid is normal. No significant adenopathy is present. No focal mucosal or submucosal lesions are present. Upper chest: Centrilobular emphysematous changes are present. No nodule or mass lesion is present. The thoracic inlet is within normal limits. Review of the MIP images confirms the above findings CTA HEAD FINDINGS Anterior circulation: Atherosclerotic calcifications are present within the cavernous internal carotid arteries bilaterally without significant stenosis. The left ICA terminus is within normal limits. Overlapping stents extend from the right ICA terminus into a right M2 branch. High-grade M2 stenoses are  present just distal to the stent with asymmetric attenuation of right MCA branches. No residual aneurysm is present. Distal segmental narrowing is present in the ACA branch vessels and right MCA branch vessels. No other significant proximal stenosis or occlusion is present. Posterior circulation: The left vertebral artery is dominant vessel. The PICA origins are visualized and normal. Atherosclerotic changes are present at the vertebrobasilar junction with a high-grade proximal basilar artery stenosis. The more distal basilar artery is attenuated. Both posterior cerebral arteries originate from basilar tip. High-grade stenosis is present in proximal right P1 segment. Moderate narrowing is present in the inferior P3 segments bilaterally. Venous sinuses: The dural sinuses are patent. The straight sinus and deep cerebral veins are  intact. Cortical veins are within normal limits. No significant vascular malformation is evident. Anatomic variants: None Review of the MIP images confirms the above findings IMPRESSION: 1. Overlapping stents extend from the right ICA terminus into a right M2 branch. 2. High-grade M2 stenoses just distal to the stent with asymmetric attenuation of right MCA branches. This may result in decreased cerebral blood flow to portions of the right MCA territory. 3. No residual aneurysm. 4. Distal segmental narrowing of the ACA branch vessels and right MCA branch vessels. 5. 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 6. High-grade stenosis of the proximal right P1 segment. 7. Moderate narrowing of the inferior P3 segments bilaterally. 8. Aortic Atherosclerosis (ICD10-I70.0) and Emphysema (ICD10-J43.9). These results were called by telephone at the time of interpretation on 09/15/2023 at 1:00 pm to provider [facility] , who verbally acknowledged these results. Electronically Signed   By: Marin Roberts M.D.   On: 09/15/2023 13:00   CT HEAD CODE STROKE WO CONTRAST`  Result Date: 09/15/2023 CLINICAL DATA:  Code stroke.  Mental status change, unknown cause EXAM: CT HEAD WITHOUT CONTRAST TECHNIQUE: Contiguous axial images were obtained from the base of the skull through the vertex without intravenous contrast. RADIATION DOSE REDUCTION: This exam was performed according to the departmental dose-optimization program which includes automated exposure control, adjustment of the mA and/or kV according to patient size and/or use of iterative reconstruction technique. COMPARISON:  None Available. FINDINGS: Brain: No evidence of acute large vascular territory infarction, hemorrhage, hydrocephalus, extra-axial collection or mass lesion/mass effect. Remote appearing right PCA territory infarct is expanded compared to the prior. Remote left cerebellar infarct. Vascular: Right MCA stent. Skull: No acute fracture.  Sinuses/Orbits: Clear sinuses.  No acute orbital findings. ASPECTS [facility] Stroke Program Early CT Score) total score (0-10 with 10 being normal): 10. IMPRESSION: 1. No definite evidence of acute large vascular territory infarct or acute hemorrhage. 2. Remote appearing right PCA territory infarct appears expanded compared to the prior. An MRI could better assess for acute peri-infarct ischemia if clinically warranted. 3. Remote left PCA territory infarct. These results will be called to the ordering clinician or representative by the Radiologist Assistant, and communication documented in the PACS or [employer]. Electronically Signed   By: Feliberto Harts M.D.   On: 09/15/2023 12:37   [place] CHEST (PLEURAL EFFUSION)  Result Date: 09/14/2023 CLINICAL DATA:  Patient admitted with acute heart failure exacerbation presents today with bilateral pleural effusions. EXAM: CHEST ULTRASOUND COMPARISON:  None Available. FINDINGS: Limited ultrasound examination of both lungs revealed scant pleural fluid on the left side and small volume pleural fluid on the right side. Procedure risks and benefits were discussed with the patient and given the small volume of the pleural fluid on the right side the patient elected  not to have procedure performed. Patient aware that the healthcare team had requested labs. Patient still refused. IMPRESSION: Small volume pleural fluid in the right lung. Patient refused procedure. No procedure performed. Ultrasound images obtained by Alwyn Ren NP. Electronically Signed   By: Marliss Coots M.D.   On: 09/14/2023 13:48   ECHOCARDIOGRAM LIMITED  Result Date: 09/14/2023    ECHOCARDIOGRAM LIMITED REPORT   Patient Name:   Amy Howell Date of Exam: 09/14/2023 Medical Rec #:  [id number]     Height:       60.0 in Accession #:    [id number]    Weight:       86.6 lb Date of Birth:  1956-05-20      BSA:          1.308 m Patient Age:    67 years      BP:           155/74 mmHg Patient Gender: F              HR:           72 bpm. Exam Location:  Inpatient Procedure: Limited Echo, Color Doppler and Cardiac Doppler Indications:    Pericardial Effusion  History:        Patient has prior history of Echocardiogram examinations, most                 recent 09/13/2023. CHF, Stroke; Risk Factors:Hypertension,                 Diabetes, Dyslipidemia and Former Smoker.  Sonographer:    Milbert Coulter Referring Phys: Maisie Fus IMPRESSIONS  1. Left ventricular ejection fraction, by estimation, is 30 to 35%. The left ventricle has moderately decreased function. There is severe left ventricular hypertrophy.  2. Right ventricular systolic function is mildly reduced. The right ventricular size is normal.  3. Moderate pericardial effusion, improved from prior study. . There is no evidence of cardiac tamponade.  4. Mild to moderate mitral valve regurgitation.  5. The inferior vena cava is normal in size with greater than 50% respiratory variability, suggesting right atrial pressure of 3 mmHg. Conclusion(s)/Recommendation(s): Pericardial effusion has improved. still no cardiac tamponade. FINDINGS  Left Ventricle: Left ventricular ejection fraction, by estimation, is 30 to 35%. The left ventricle has moderately decreased function. There is severe left ventricular hypertrophy. Right Ventricle: The right ventricular size is normal. Right ventricular systolic function is mildly reduced. Pericardium: Moderate pericardial effusion, improved from prior study. There is no evidence of cardiac tamponade. Mitral Valve: Mild to moderate mitral valve regurgitation. Tricuspid Valve: Tricuspid valve regurgitation is not demonstrated. Venous: The inferior vena cava is normal in size with greater than 50% respiratory variability, suggesting right atrial pressure of 3 mmHg. Additional Comments: Spectral Doppler performed. Color Doppler performed.  Carolan Clines Electronically signed by Carolan Clines Signature Date/Time: 09/14/2023/11:51:06 AM     Final    ECHOCARDIOGRAM COMPLETE  Result Date: 09/13/2023    ECHOCARDIOGRAM REPORT   Patient Name:   Amy Howell Date of Exam: 09/13/2023 Medical Rec #:  [id number]     Height:       60.0 in Accession #:    [id number]    Weight:       90.0 lb Date of Birth:  09-10-1956      BSA:          1.329 m Patient Age:    67 years      BP:  167/69 mmHg Patient Gender: F             HR:           100 bpm. Exam Location:  Inpatient Procedure: 2D Echo, Color Doppler and Cardiac Doppler Indications:    CHF  History:        Patient has prior history of Echocardiogram examinations, most                 recent 02/03/2023. CHF, Breast Cancer and Stroke; Risk                 Factors:Hypertension, Diabetes, Dyslipidemia and Former Smoker.  Sonographer:    Milbert Coulter Referring Phys: [id number] CAROLYN GUILLOUD IMPRESSIONS  1. Moderate-large pericardial effusion, primarily over RV, with epicardial fat. IVC is grossly normal. There is no RV diastolic collapse, but there may be subtle decreased diastolic expansion of the RV. No significant respiratory variability of the septum. No respiratory variability of mitral inflow signal. BP on echo 169/69, HR 100. Finding in combination do not suggest tamponade, however given size of effusion, would consider close follow up exam within 2-3 days to ensure no change. Moderate-large effusion.  2. Left ventricular ejection fraction, by estimation, is 30 to 35%. The left ventricle has moderately decreased function. The left ventricle demonstrates global hypokinesis. There is mild left ventricular hypertrophy. Indeterminate diastolic filling due  to E-A fusion.  3. Right ventricular systolic function is mildly reduced. The right ventricular size is normal. Tricuspid regurgitation signal is inadequate for assessing PA pressure.  4. Left atrial size was mildly dilated.  5. The mitral valve is grossly normal. Mild to moderate mitral valve regurgitation. No evidence of mitral stenosis.  6. The  aortic valve is tricuspid. Aortic valve regurgitation is mild to moderate (BP noted 169/69). No aortic stenosis is present. Aortic regurgitation PHT measures 360 msec.  7. The inferior vena cava is normal in size with <50% respiratory variability, suggesting right atrial pressure of 8 mmHg. FINDINGS  Left Ventricle: Left ventricular ejection fraction, by estimation, is 30 to 35%. The left ventricle has moderately decreased function. The left ventricle demonstrates global hypokinesis. The left ventricular internal cavity size was normal in size. There is mild left ventricular hypertrophy. Indeterminate diastolic filling due to E-A fusion. Right Ventricle: The right ventricular size is normal. No increase in right ventricular wall thickness. Right ventricular systolic function is mildly reduced. Tricuspid regurgitation signal is inadequate for assessing PA pressure. Left Atrium: Left atrial size was mildly dilated. Right Atrium: Right atrial size was normal in size. Pericardium: Moderate-large pericardial effusion, primarily over RV, with epicardial fat. IVC is grossly normal. There is no RV diastolic collapse, but there may be subtle decreased diastolic expansion of the RV. No significant respiratory variability of  the septum. No respiratory variability of mitral inflow signal. BP on echo 169/69, HR 100. Finding in combination do not suggest tamponade, however given size of effusion, would consider close follow up exam within 2-3 days to ensure no change. Moderate-large effusion. Mitral Valve: The mitral valve is grossly normal. Mild to moderate mitral valve regurgitation. No evidence of mitral valve stenosis. Tricuspid Valve: The tricuspid valve is normal in structure. Tricuspid valve regurgitation is trivial. No evidence of tricuspid stenosis. Aortic Valve: The aortic valve is tricuspid. Aortic valve regurgitation is mild to moderate. Aortic regurgitation PHT measures 360 msec. No aortic stenosis is present.  Aortic valve mean gradient measures 8.0 mmHg. Aortic valve peak gradient measures 15.2  mmHg. Aortic  valve area, by VTI measures 1.87 cm. Pulmonic Valve: The pulmonic valve was normal in structure. Pulmonic valve regurgitation is trivial. No evidence of pulmonic stenosis. Aorta: The aortic root is normal in size and structure. Venous: The inferior vena cava is normal in size with less than 50% respiratory variability, suggesting right atrial pressure of 8 mmHg. IAS/Shunts: The interatrial septum was not well visualized.  LEFT VENTRICLE PLAX 2D LVIDd:         4.50 cm   Diastology LVIDs:         3.50 cm   LV e' medial:    5.33 cm/s LV PW:         1.20 cm   LV E/e' medial:  11.0 LV IVS:        1.20 cm   LV e' lateral:   4.35 cm/s LVOT diam:     1.90 cm   LV E/e' lateral: 13.5 LV SV:         51 LV SV Index:   38 LVOT Area:     2.84 cm  RIGHT VENTRICLE RV Basal diam:  2.30 cm RV Mid diam:    1.90 cm RV S prime:     11.10 cm/s TAPSE (M-mode): 2.0 cm LEFT ATRIUM             Index        RIGHT ATRIUM           Index LA diam:        2.90 cm 2.18 cm/m   RA Area:     10.90 cm LA Vol (A2C):   49.7 ml 37.38 ml/m  RA Volume:   22.00 ml  16.55 ml/m LA Vol (A4C):   36.8 ml 27.68 ml/m LA Biplane Vol: 45.3 ml 34.07 ml/m  AORTIC VALVE AV Area (Vmax):    1.96 cm AV Area (Vmean):   1.77 cm AV Area (VTI):     1.87 cm AV Vmax:           195.00 cm/s AV Vmean:          127.000 cm/s AV VTI:            0.272 m AV Peak Grad:      15.2 mmHg AV Mean Grad:      8.0 mmHg LVOT Vmax:         135.00 cm/s LVOT Vmean:        79.100 cm/s LVOT VTI:          0.179 m LVOT/AV VTI ratio: 0.66 AI PHT:            360 msec  AORTA Ao Root diam: 2.70 cm MITRAL VALVE MV Area (PHT): 5.97 cm     SHUNTS MV Decel Time: 127 msec     Systemic VTI:  0.18 m MR Peak grad: 121.9 mmHg    Systemic Diam: 1.90 cm MR Mean grad: 86.0 mmHg MR Vmax:      552.00 cm/s MR Vmean:     450.0 cm/s MV E velocity: 58.70 cm/s MV A velocity: 119.00 cm/s MV E/A ratio:  0.49 Weston Brass MD Electronically signed by Weston Brass MD Signature Date/Time: 09/13/2023/2:04:08 PM    Final    CT Angio Chest PE W and/or Wo Contrast  Result Date: 09/12/2023 CLINICAL DATA:  High probability for PE. EXAM: CT ANGIOGRAPHY CHEST WITH CONTRAST TECHNIQUE: Multidetector CT imaging of the chest was performed using the standard protocol during bolus administration of intravenous contrast. Multiplanar CT  image reconstructions and MIPs were obtained to evaluate the vascular anatomy. RADIATION DOSE REDUCTION: This exam was performed according to the departmental dose-optimization program which includes automated exposure control, adjustment of the mA and/or kV according to patient size and/or use of iterative reconstruction technique. CONTRAST:  75mL OMNIPAQUE IOHEXOL 350 MG/ML SOLN COMPARISON:  None Available. FINDINGS: Cardiovascular: Heart is significantly enlarged. There is a small pericardial effusion. Aorta is normal in size. There are atherosclerotic calcifications of the aorta. There is adequate opacification of the pulmonary arteries to the segmental level. There is no evidence for pulmonary embolism. Mediastinum/Nodes: No enlarged mediastinal, hilar, or axillary lymph nodes. Thyroid gland, trachea, and esophagus demonstrate no significant findings. Lungs/Pleura: Small left and moderate right pleural effusions are present. There is no pneumothorax. Moderate emphysematous changes are present. Patchy ground-glass opacities are seen in the bilateral lower lobes and right middle lobe. Peripheral nodular density in the right upper lobe measures 5 mm image 4/61. There is bilateral lower lobe and right middle lobe peribronchial wall thickening. Upper Abdomen: No acute abnormality. Musculoskeletal: No chest wall abnormality. No acute or significant osseous findings. Review of the MIP images confirms the above findings. IMPRESSION: 1. No evidence for pulmonary embolism. 2. Cardiomegaly with small  pericardial effusion. 3. Moderate right and small left pleural effusions. 4. Patchy ground-glass opacities in the bilateral lower lobes and right middle lobe worrisome for infection. 5. Bilateral lower lobe and right middle lobe peribronchial wall thickening worrisome for bronchitis 6. Right solid pulmonary nodule within the upper lobe measuring 5 mm. Per Fleischner Society Guidelines,if patient is low risk for malignancy, no routine follow-up imaging is recommended. If patient is high risk for malignancy, a non-contrast Chest CT at 12 months is optional. If performed and the nodule is stable at 12 months, no further follow-up is recommended. These guidelines do not apply to immunocompromised patients and patients with cancer. Follow up in patients with significant comorbidities as clinically warranted. For lung cancer screening, adhere to Lung-RADS guidelines. Reference: Radiology. 2017; 284(1):228-43. 7. Moderate emphysema. Aortic Atherosclerosis (ICD10-I70.0) and Emphysema (ICD10-J43.9). Electronically Signed   By: Darliss Cheney M.D.   On: 09/12/2023 19:49   CT ABDOMEN PELVIS W CONTRAST  Result Date: 09/12/2023 CLINICAL DATA:  Shortness of breath and diarrhea for the last month. Acute nonlocalized abdominal pain EXAM: CT ABDOMEN AND PELVIS WITH CONTRAST TECHNIQUE: Multidetector CT imaging of the abdomen and pelvis was performed using the standard protocol following bolus administration of intravenous contrast. RADIATION DOSE REDUCTION: This exam was performed according to the departmental dose-optimization program which includes automated exposure control, adjustment of the mA and/or kV according to patient size and/or use of iterative reconstruction technique. CONTRAST:  75mL OMNIPAQUE IOHEXOL 350 MG/ML SOLN COMPARISON:  CT abdomen and pelvis 03/27/2017 FINDINGS: Lower chest: Bilateral pleural effusions and ground-glass opacities. See separate report for detailed findings of the chest. Hepatobiliary:  Unremarkable. Pancreas: Unremarkable. Spleen: Unremarkable. Adrenals/Urinary Tract: Unremarkable adrenal glands. Low-attenuation lesions in the kidneys are statistically likely to represent cysts. No follow-up is required. No urinary calculi or hydronephrosis. Unremarkable bladder. Stomach/Bowel: Extensive sigmoid diverticulosis. Mild wall thickening with trace fluid about the sigmoid colon suggesting diverticulitis. No obstruction. Stomach and appendix within limits. Vascular/Lymphatic: Aortic atherosclerosis. No enlarged abdominal or pelvic lymph nodes. Unremarkable. Reproductive: Hysterectomy.  No adnexal mass. Other: No free intraperitoneal air. Musculoskeletal: No acute fracture. IMPRESSION: 1. Possible mild uncomplicated sigmoid diverticulitis. 2. Bilateral pleural effusions and ground-glass opacities. See separate report for detailed findings of the chest Aortic Atherosclerosis (ICD10-I70.0).  Electronically Signed   By: Minerva Fester M.D.   On: 09/12/2023 19:47   DG Chest 2 View  Result Date: 09/12/2023 CLINICAL DATA:  Shortness of breath EXAM: CHEST - 2 VIEW COMPARISON:  X-ray 11/21/2022 FINDINGS: Hyperinflation. Increasing small pleural effusions with the adjacent parenchymal opacities. There also some nodular areas suggested such as right midlung and right upper lung. No pneumothorax. Enlarged cardiopericardial silhouette with calcified aorta. Osteopenia. Degenerative changes of the spine IMPRESSION: Slightly enlarged heart with vascular congestion and some interstitial edema. Increasing pleural effusions with the adjacent opacities. There is also a suggestion of some nodular areas in the lung on the right. Although this could be infiltrative underlying lesions are possible. Overall recommend a follow-up contrast chest CT when appropriate Electronically Signed   By: Karen Kays M.D.   On: 09/12/2023 15:12     PHYSICAL EXAM  Temp:  [97.5 F (36.4 C)-98.8 F (37.1 C)] 98.3 F (36.8 C) (10/16  1522) Pulse Rate:  [69-97] 92 (10/16 1522) Resp:  [14-20] 17 (10/16 1522) BP: (114-202)/(49-82) 138/63 (10/16 1522) SpO2:  [98 %-100 %] 100 % (10/16 1522) Weight:  [40.6 kg] 40.6 kg (10/16 0538)  General - Well nourished, well developed, in no apparent distress.  Ophthalmologic - fundi not visualized due to noncooperation.  Cardiovascular - Regular rhythm and rate.  Mental Status -  Level of arousal and orientation to time, place, and person were intact. Language including expression, naming, repetition, comprehension was assessed and found intact. Fund of Knowledge was assessed and was intact.  Cranial Nerves II - XII - II - Visual field intact OU. III, IV, VI - Extraocular movements intact. V - Facial sensation intact bilaterally. VII - Facial movement intact bilaterally. VIII - Hearing & vestibular intact bilaterally. X - Palate elevates symmetrically. XI - Chin turning & shoulder shrug intact bilaterally. XII - Tongue protrusion intact.  Motor Strength - The patient's strength was normal in all extremities and pronator drift was absent.  Bulk was normal and fasciculations were absent.   Motor Tone - Muscle tone was assessed at the neck and appendages and was normal.  Reflexes - The patient's reflexes were symmetrical in all extremities and she had no pathological reflexes.  Sensory - Light touch, temperature/pinprick were assessed and were symmetrical.    Coordination - The patient had normal movements in the hands and feet with no ataxia or dysmetria.  Tremor was absent.  Gait and Station - deferred.   ASSESSMENT/PLAN Amy Howell is a 67 y.o. female with history of hypertension, hyperlipidemia, CAD, CHF, left breast cancer status post ectomy, smoker, history of stroke, brain aneurysm status post stenting admitted for CHF exacerbation and hypertensive urgency.  Received BP medications but developed dizziness, diaphoresis, generalized weakness more on the left upper  extremity and left facial droop.   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 5 mm x 3.1 mm x 4 mm right MCA trunk saccular aneurysm. 2.4 mm x 2.5 mm saccular aneurysm at the origin of the inferior division of the right MCA. 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 2D Echo EF 30 to  35%, left ventricular hypertrophy, moderate to large pericardial effusion LDL 91 HgbA1c 5.6 UDS pending SCDs for VTE prophylaxis aspirin 81 mg daily and Brilinta (ticagrelor) 90 mg bid prior to admission, now on aspirin 81 mg daily and Brilinta (ticagrelor) 90 mg bid. Continue on discharge Patient counseled to be compliant with her antithrombotic medications Ongoing aggressive stroke risk factor management Therapy recommendations: None Disposition: Pending  History of stroke and brain aneurysm 03/2017 admitted for left hemianopia.  MRI showed bilateral occipital acute/subacute infarcts, old bilateral cerebellar infarcts.  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  EF 65 to 70%.  TEE unremarkable. Pan CT negative for malignancy.  Discharged on aspirin and Lipitor 40. 05/2017 status post right MCA stenting for aneurysm treatment with Dr. Corliss Skains. 08/2017 follow-up angiogram showed 50% right MCA stenosis, 90% basilar artery distal stenosis, 70 to 75% basilar artery proximal stenosis. 09/17/23 S/p cerebral angiogram  Patent right MCA stent. 5 mm x 3.1 mm x 4 mm right MCA trunk saccular aneurysm. 2.4 mm x 2.5 mm saccular aneurysm at the origin of the inferior division of the right MCA.  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 Per Dr Corliss Skains, pt will need gain some weight and follow up as outpt to consider aneurysm treatment  CAD CHF Cardiology on board  EF 30 to 35%, moderate to large pericardial effusion, LVH On aspirin and Brilinta  Hypertensive urgency Hypotension due to drug effect Patient admitted with hypertensive urgency status post IV BP meds 10/14 received BiDil but developed hypotension.  Shortly after code stroke was called due to dizziness, generalized weakness but more so on the left upper extremity, slight slurred speech and left facial droop and diaphoresis.  Symptoms improved after BP improved.  Bidil discontinued. Currently BP stable Long term BP goal normotensive  Hyperlipidemia Home meds: Lipitor 40 LDL 91, goal < 70 Now on Lipitor 80 Continue statin at discharge  Tobacco abuse Current smoker Smoking cessation counseling provided Pt is willing to quit  Other Stroke Risk Factors Advanced age CAD  Other Active Problems Breast cancer status post mastectomy  Hospital day # 5  Neurology will sign off. Please call with questions. Pt will follow up with stroke clinic NP at [facility] in about 4 weeks. Thanks for the consult.   Marvel Plan, MD PhD Stroke Neurology 09/17/2023 3:26 PM    To contact Stroke Continuity provider, please refer to [email or web address]. After hours, contact General Neurology

## 2023-09-17 NOTE — Consult Note (Signed)
Chief Complaint: Patient was seen in consultation today for Cerebral arteriogram Chief Complaint  Patient presents with   Diarrhea   Shortness of Breath   at the request of Dr Jerel Shepherd   Supervising Physician: Julieanne Cotton  Patient Status: [facility] - In-pt  History of Present Illness: Amy Howell is a 67 y.o. 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 CHF; HTN; HLD; CAD Lt breast Ca Came to ED 10/11 with SOB; slurred speech; dizziness  CTA shows M2 stenosis, concern for narrowing at the intracranial stent in the right MCA . MRI showed small stroke in right hemisphere, likely related to this narrowing  MRA:  1. 6 x 8 mm acute infarction in the subcortical white matter of the right posterior frontal lobe. No swelling or hemorrhage. 2. Extensive chronic small-vessel ischemic changes elsewhere throughout the brain as outlined above. 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   Sxs resolving to minimal Dr Roda Shutters requesting Cerebral arteriogram Dr Corliss Skains has reviewed imaging and approves procedure   Past Medical History:  Diagnosis Date   Cataracts, bilateral    Cerebral infarction due to carotid artery stenosis (HCC)    Diabetes mellitus type 2 in nonobese (HCC)    Glaucoma    Hypertension    Hypertensive emergency 11/22/2022   Stroke [facility])    no residual, "series of mini strokes"   Tobacco abuse    Vertigo     Past Surgical History:  Procedure Laterality Date   ABDOMINAL HYSTERECTOMY     CARDIAC DEFIBRILLATOR PLACEMENT     CORONARY STENT INTERVENTION N/A 11/26/2022   Procedure: CORONARY STENT INTERVENTION;  Surgeon: Corky Crafts, MD;  Location: [facility] INVASIVE CV LAB;  Service: Cardiovascular;  Laterality: N/A;   gum operation     Implantable loop recorder removal  02/01/2019    MDT Reveal LINQ explantation in office by JA   IR 3D INDEPENDENT WKST  05/07/2017   IR ANGIO INTRA EXTRACRAN SEL COM CAROTID INNOMINATE BILAT MOD SED  04/23/2017   IR ANGIO INTRA EXTRACRAN SEL COM CAROTID INNOMINATE BILAT MOD SED  08/06/2017   IR ANGIO INTRA EXTRACRAN SEL COM CAROTID INNOMINATE UNI R MOD SED  05/07/2017   IR ANGIO VERTEBRAL SEL VERTEBRAL BILAT MOD SED  04/23/2017   IR ANGIO VERTEBRAL SEL VERTEBRAL BILAT MOD SED  08/06/2017   IR ANGIOGRAM FOLLOW UP STUDY  05/07/2017   IR ANGIOGRAM SELECTIVE EACH ADDITIONAL VESSEL  05/07/2017   IR RADIOLOGIST EVAL & MGMT  04/16/2017   IR RADIOLOGIST EVAL & MGMT  05/29/2017   IR TRANSCATH/EMBOLIZ  05/07/2017   LOOP RECORDER INSERTION N/A 03/28/2017   Procedure: Loop Recorder Insertion;  Surgeon: Hillis Range, MD;  Location: [facility] CV LAB;  Service: Cardiovascular;  Laterality: N/A;   MASTECTOMY W/ SENTINEL NODE BIOPSY Right 06/25/2022   Procedure: RIGHT MASTECTOMY WITH AXILLARY SENTINEL LYMPH NODE BIOPSY;  Surgeon: Manus Rudd, MD;  Location: [facility];  Service: General;  Laterality: Right;   RADIOLOGY WITH ANESTHESIA N/A 05/07/2017   Procedure: EMBOLIZATION;  Surgeon: Julieanne Cotton, MD;  Location: [facility] OR;  Service: Radiology;  Laterality: N/A;   RADIOLOGY WITH ANESTHESIA N/A 08/06/2017   Procedure: RADIOLOGY WITH ANESTHESIA STENTING;  Surgeon: Julieanne Cotton, MD;  Location: [facility] OR;  Service: Radiology;  Laterality: N/A;   RIGHT/LEFT HEART CATH AND CORONARY  ANGIOGRAPHY N/A 11/26/2022   Procedure: RIGHT/LEFT HEART CATH AND CORONARY ANGIOGRAPHY;  Surgeon: Corky Crafts, MD;  Location: [facility] INVASIVE CV LAB;  Service: Cardiovascular;  Laterality: N/A;   TEE WITHOUT CARDIOVERSION N/A 03/28/2017   Procedure: TRANSESOPHAGEAL ECHOCARDIOGRAM (TEE);  Surgeon: Lewayne Bunting, MD;  Location: [facility] ENDOSCOPY;  Service: Cardiovascular;  Laterality: N/A;    Allergies: Lisinopril, Pork-derived products, and Rosuvastatin  Medications: Prior  to Admission medications   Medication Sig Start Date End Date Taking? Authorizing Provider  aspirin EC 325 MG tablet Take 325 mg by mouth daily.   Yes [provider]  atorvastatin (LIPITOR) 40 MG tablet Take 1 tablet (40 mg total) by mouth daily. 03/17/23  Yes Corky Crafts, MD  carvedilol (COREG) 6.25 MG tablet Take 1 tablet (6.25 mg total) by mouth 2 (two) times daily. 03/17/23  Yes Corky Crafts, MD  dorzolamide (TRUSOPT) 2 % ophthalmic solution Instill 1 drop into both eyes twice a day 02/19/22  Yes   feeding supplement, ENSURE ENLIVE, (ENSURE ENLIVE) LIQD Take 237 mLs by mouth daily.   Yes [provider]  hydrochlorothiazide (HYDRODIURIL) 12.5 MG tablet Take 1 tablet (12.5 mg total) by mouth daily. 08/28/23  Yes Rana Snare, DO  latanoprost (XALATAN) 0.005 % ophthalmic solution Instill 1 drop into both eyes at bedtime 02/19/22  Yes   spironolactone (ALDACTONE) 25 MG tablet Take 1 tablet (25 mg total) by mouth daily. 07/23/23  Yes Rana Snare, DO  ticagrelor (BRILINTA) 90 MG TABS tablet Take 1 tablet (90 mg total) by mouth 2 (two) times daily. Patient taking differently: Take 0.5 tablets by mouth 2 (two) times daily. 05/08/23  Yes Corky Crafts, MD  timolol (TIMOPTIC) 0.5 % ophthalmic solution Place 1 drop into both eyes every morning. 04/30/23  Yes   aspirin 81 MG chewable tablet Chew 1 tablet (81 mg total) by mouth daily. Patient not taking: Reported on 09/12/2023 11/28/22   Alberteen Sam, MD     Family History  Problem Relation Age of Onset   Dementia Mother    Kidney disease Father    Hypertension Father    Diabetes Father    Heart disease Father    Lung cancer Maternal Aunt        dx after 75   Prostate cancer Other        MGM's brother   Breast cancer Neg Hx    Colon cancer Neg Hx    Liver disease Neg Hx    Esophageal cancer Neg Hx     Social History   Socioeconomic History   Marital status: Single    Spouse name: Not on  file   Number of children: 2   Years of education: Not on file   Highest education level: Not on file  Occupational History   Occupation: retired  Tobacco Use   Smoking status: Every Day    Current packs/day: 0.50    Average packs/day: 0.5 packs/day for 56.0 years (28.0 ttl pk-yrs)    Types: Cigarettes   Smokeless tobacco: Never  Vaping Use   Vaping status: Never Used  Substance and Sexual Activity   Alcohol use: No   Drug use: No   Sexual activity: Not Currently    Birth control/protection: Surgical    Comment: Hyst  Other Topics Concern   Not on file  Social History Narrative   Not on file   Social Determinants of Health   Financial Resource Strain: Medium Risk (04/08/2023)   Overall  Financial Resource Strain (CARDIA)    Difficulty of Paying Living Expenses: Somewhat hard  Food Insecurity: No Food Insecurity (09/13/2023)   Hunger Vital Sign    Worried About Running Out of Food in the Last Year: Never true    Ran Out of Food in the Last Year: Never true  Transportation Needs: No Transportation Needs (09/13/2023)   PRAPARE - Administrator, Civil Service (Medical): No    Lack of Transportation (Non-Medical): No  Physical Activity: Insufficiently Active (04/08/2023)   Exercise Vital Sign    Days of Exercise per Week: 3 days    Minutes of Exercise per Session: 30 min  Stress: No Stress Concern Present (04/08/2023)   [employer] of Occupational Health - Occupational Stress Questionnaire    Feeling of Stress : Not at all  Social Connections: Socially Isolated (04/08/2023)   Social Connection and Isolation Panel [NHANES]    Frequency of Communication with Friends and Family: Three times a week    Frequency of Social Gatherings with Friends and Family: Twice a week    Attends Religious Services: Never    Database administrator or Organizations: No    Attends Banker Meetings: Never    Marital Status: Never married    Review of Systems: A 12 point  ROS discussed and pertinent positives are indicated in the HPI above.  All other systems are negative.  Review of Systems  Constitutional:  Negative for activity change, fatigue and fever.  HENT:  Negative for tinnitus and trouble swallowing.   Eyes:  Negative for visual disturbance.  Respiratory:  Negative for cough and shortness of breath.   Gastrointestinal:  Negative for abdominal pain, nausea and vomiting.  Musculoskeletal:  Negative for back pain and gait problem.  Neurological:  Positive for dizziness. Negative for tremors, seizures, syncope, facial asymmetry, speech difficulty, weakness, light-headedness, numbness and headaches.  Psychiatric/Behavioral:  Negative for behavioral problems and confusion.     Vital Signs: BP (!) 168/76 (BP Location: Left Arm)   Pulse 78   Temp (!) 97.5 F (36.4 C) (Oral)   Resp 18   Ht 5' (1.524 m)   Wt 89 lb 8.1 oz (40.6 kg)   SpO2 98%   BMI 17.48 kg/m   Advance Care Plan: The advanced care plan/surrogate decision maker was discussed at the time of visit and documented in the medical record.    Physical Exam Vitals reviewed.  Eyes:     Extraocular Movements: Extraocular movements intact.  Cardiovascular:     Rate and Rhythm: Normal rate and regular rhythm.  Pulmonary:     Effort: Pulmonary effort is normal.     Breath sounds: No wheezing or rhonchi.  Abdominal:     Palpations: Abdomen is soft.     Tenderness: There is no abdominal tenderness.  Musculoskeletal:     Cervical back: Normal range of motion.     Right lower leg: No tenderness. No edema.     Left lower leg: No tenderness. No edema.  Skin:    General: Skin is warm and dry.  Neurological:     Mental Status: She is alert and oriented to person, place, and time.  Psychiatric:        Mood and Affect: Mood normal.        Behavior: Behavior normal.     Imaging: MR BRAIN WO CONTRAST  Result Date: 09/15/2023 CLINICAL DATA:  Neuro deficit, acute, stroke suspected. EXAM:  MRI HEAD WITHOUT CONTRAST TECHNIQUE:  Multiplanar, multiecho pulse sequences of the brain and surrounding structures were obtained without intravenous contrast. COMPARISON:  CT studies same day.  MRI 03/27/2017. FINDINGS: Brain: Diffusion imaging shows an acute 6 x 8 mm infarction in the subcortical white matter of the right posterior frontal lobe. No other acute insult. Chronic small-vessel ischemic changes affect the pons. 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 Old bilateral thalamic lacunar infarctions. Few old small vessel infarctions of the basal ganglia. Widespread chronic small-vessel ischemic changes of the cerebral hemispheric deep and subcortical white matter. No mass, hemorrhage, hydrocephalus or extra-axial collection. Few scattered punctate foci of hemosiderin deposition related to old small vessel infarctions. Vascular: Major vessels at the base of the brain show flow. Skull and upper cervical spine: Negative Sinuses/Orbits: Clear/normal Other: None IMPRESSION: 1. 6 x 8 mm acute infarction in the subcortical white matter of the right posterior frontal lobe. No swelling or hemorrhage. 2. Extensive chronic small-vessel ischemic changes elsewhere throughout the brain as outlined above. 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 Electronically Signed   By: Paulina Fusi M.D.   On: 09/15/2023 16:09   CT ANGIO HEAD NECK W WO CM (CODE STROKE)  Result Date: 09/15/2023 CLINICAL DATA:  Code stroke.  Neuro deficit, acute stroke suspected. EXAM: CT ANGIOGRAPHY HEAD AND NECK WITH AND WITHOUT CONTRAST TECHNIQUE: Multidetector CT imaging of the head and neck was performed using the standard protocol during bolus administration of intravenous contrast. Multiplanar CT image reconstructions and MIPs were obtained to evaluate the vascular anatomy. Carotid  stenosis measurements (when applicable) are obtained utilizing NASCET criteria, using the distal internal carotid diameter as the denominator. RADIATION DOSE REDUCTION: This exam was performed according to the departmental dose-optimization program which includes automated exposure control, adjustment of the mA and/or kV according to patient size and/or use of iterative reconstruction technique. CONTRAST:  75mL OMNIPAQUE IOHEXOL 350 MG/ML SOLN COMPARISON:  CT head without contrast 09/15/2023 FINDINGS: CTA NECK FINDINGS Aortic arch: Extensive atherosclerotic changes are present at the aortic arch. The great vessel origins are not imaged. No focal stenosis or aneurysm is visualized. Right carotid system: Mild wall irregularity is present in the distal right common carotid artery without significant stenosis. Atherosclerotic calcifications are present at the right carotid bifurcation without significant stenosis. The cervical right ICA is otherwise normal. Left carotid system: The left common carotid artery is within normal limits. Minimal atherosclerotic calcifications are present at the bifurcation without significant stenosis. The cervical left ICA is normal. Vertebral arteries: The left vertebral artery is the dominant vessel. Both vertebral arteries originate from the subclavian arteries without significant stenosis. No significant stenosis is present in either vertebral artery in the neck. Skeleton: Mild degenerative changes are present most evident at C5-6. Vertebral body heights are maintained. No focal osseous lesions are present. Other neck: Soft tissues the neck are otherwise unremarkable. Salivary glands are within normal limits. Thyroid is normal. No significant adenopathy is present. No focal mucosal or submucosal lesions are present. Upper chest: Centrilobular emphysematous changes are present. No nodule or mass lesion is present. The thoracic inlet is within normal limits. Review of the MIP images  confirms the above findings CTA HEAD FINDINGS Anterior circulation: Atherosclerotic calcifications are present within the cavernous internal carotid arteries bilaterally without significant stenosis. The left ICA terminus is within normal limits. Overlapping stents extend from the right ICA terminus into a right M2 branch. High-grade M2  stenoses are present just distal to the stent with asymmetric attenuation of right MCA branches. No residual aneurysm is present. Distal segmental narrowing is present in the ACA branch vessels and right MCA branch vessels. No other significant proximal stenosis or occlusion is present. Posterior circulation: The left vertebral artery is dominant vessel. The PICA origins are visualized and normal. Atherosclerotic changes are present at the vertebrobasilar junction with a high-grade proximal basilar artery stenosis. The more distal basilar artery is attenuated. Both posterior cerebral arteries originate from basilar tip. High-grade stenosis is present in proximal right P1 segment. Moderate narrowing is present in the inferior P3 segments bilaterally. Venous sinuses: The dural sinuses are patent. The straight sinus and deep cerebral veins are intact. Cortical veins are within normal limits. No significant vascular malformation is evident. Anatomic variants: None Review of the MIP images confirms the above findings IMPRESSION: 1. Overlapping stents extend from the right ICA terminus into a right M2 branch. 2. High-grade M2 stenoses just distal to the stent with asymmetric attenuation of right MCA branches. This may result in decreased cerebral blood flow to portions of the right MCA territory. 3. No residual aneurysm. 4. Distal segmental narrowing of the ACA branch vessels and right MCA branch vessels. 5. 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 6. High-grade stenosis of the proximal right P1 segment. 7. Moderate narrowing of the inferior P3 segments bilaterally. 8. Aortic  Atherosclerosis (ICD10-I70.0) and Emphysema (ICD10-J43.9). These results were called by telephone at the time of interpretation on 09/15/2023 at 1:00 pm to provider [facility] , who verbally acknowledged these results. Electronically Signed   By: Marin Roberts M.D.   On: 09/15/2023 13:00   CT HEAD CODE STROKE WO CONTRAST`  Result Date: 09/15/2023 CLINICAL DATA:  Code stroke.  Mental status change, unknown cause EXAM: CT HEAD WITHOUT CONTRAST TECHNIQUE: Contiguous axial images were obtained from the base of the skull through the vertex without intravenous contrast. RADIATION DOSE REDUCTION: This exam was performed according to the departmental dose-optimization program which includes automated exposure control, adjustment of the mA and/or kV according to patient size and/or use of iterative reconstruction technique. COMPARISON:  None Available. FINDINGS: Brain: No evidence of acute large vascular territory infarction, hemorrhage, hydrocephalus, extra-axial collection or mass lesion/mass effect. Remote appearing right PCA territory infarct is expanded compared to the prior. Remote left cerebellar infarct. Vascular: Right MCA stent. Skull: No acute fracture. Sinuses/Orbits: Clear sinuses.  No acute orbital findings. ASPECTS [facility] Stroke Program Early CT Score) total score (0-10 with 10 being normal): 10. IMPRESSION: 1. No definite evidence of acute large vascular territory infarct or acute hemorrhage. 2. Remote appearing right PCA territory infarct appears expanded compared to the prior. An MRI could better assess for acute peri-infarct ischemia if clinically warranted. 3. Remote left PCA territory infarct. These results will be called to the ordering clinician or representative by the Radiologist Assistant, and communication documented in the PACS or [employer]. Electronically Signed   By: Feliberto Harts M.D.   On: 09/15/2023 12:37   [place] CHEST (PLEURAL EFFUSION)  Result Date:  09/14/2023 CLINICAL DATA:  Patient admitted with acute heart failure exacerbation presents today with bilateral pleural effusions. EXAM: CHEST ULTRASOUND COMPARISON:  None Available. FINDINGS: Limited ultrasound examination of both lungs revealed scant pleural fluid on the left side and small volume pleural fluid on the right side. Procedure risks and benefits were discussed with the patient and given the small volume of the pleural fluid on the right side the  patient elected not to have procedure performed. Patient aware that the healthcare team had requested labs. Patient still refused. IMPRESSION: Small volume pleural fluid in the right lung. Patient refused procedure. No procedure performed. Ultrasound images obtained by Alwyn Ren NP. Electronically Signed   By: Marliss Coots M.D.   On: 09/14/2023 13:48   ECHOCARDIOGRAM LIMITED  Result Date: 09/14/2023    ECHOCARDIOGRAM LIMITED REPORT   Patient Name:   Amy Howell Date of Exam: 09/14/2023 Medical Rec #:  [id number]     Height:       60.0 in Accession #:    [id number]    Weight:       86.6 lb Date of Birth:  [birth year]      BSA:          1.308 m Patient Age:    67 years      BP:           155/74 mmHg Patient Gender: F             HR:           72 bpm. Exam Location:  Inpatient Procedure: Limited Echo, Color Doppler and Cardiac Doppler Indications:    Pericardial Effusion  History:        Patient has prior history of Echocardiogram examinations, most                 recent 09/13/2023. CHF, Stroke; Risk Factors:Hypertension,                 Diabetes, Dyslipidemia and Former Smoker.  Sonographer:    Milbert Coulter Referring Phys: Maisie Fus IMPRESSIONS  1. Left ventricular ejection fraction, by estimation, is 30 to 35%. The left ventricle has moderately decreased function. There is severe left ventricular hypertrophy.  2. Right ventricular systolic function is mildly reduced. The right ventricular size is normal.  3. Moderate pericardial effusion,  improved from prior study. . There is no evidence of cardiac tamponade.  4. Mild to moderate mitral valve regurgitation.  5. The inferior vena cava is normal in size with greater than 50% respiratory variability, suggesting right atrial pressure of 3 mmHg. Conclusion(s)/Recommendation(s): Pericardial effusion has improved. still no cardiac tamponade. FINDINGS  Left Ventricle: Left ventricular ejection fraction, by estimation, is 30 to 35%. The left ventricle has moderately decreased function. There is severe left ventricular hypertrophy. Right Ventricle: The right ventricular size is normal. Right ventricular systolic function is mildly reduced. Pericardium: Moderate pericardial effusion, improved from prior study. There is no evidence of cardiac tamponade. Mitral Valve: Mild to moderate mitral valve regurgitation. Tricuspid Valve: Tricuspid valve regurgitation is not demonstrated. Venous: The inferior vena cava is normal in size with greater than 50% respiratory variability, suggesting right atrial pressure of 3 mmHg. Additional Comments: Spectral Doppler performed. Color Doppler performed.  Carolan Clines Electronically signed by Carolan Clines Signature Date/Time: 09/14/2023/11:51:06 AM    Final    ECHOCARDIOGRAM COMPLETE  Result Date: 09/13/2023    ECHOCARDIOGRAM REPORT   Patient Name:   Amy Howell Date of Exam: 09/13/2023 Medical Rec #:  [id number]     Height:       60.0 in Accession #:    [id number]    Weight:       90.0 lb Date of Birth:  [birth year]      BSA:          1.329 m Patient Age:    67 years      BP:  167/69 mmHg Patient Gender: F             HR:           100 bpm. Exam Location:  Inpatient Procedure: 2D Echo, Color Doppler and Cardiac Doppler Indications:    CHF  History:        Patient has prior history of Echocardiogram examinations, most                 recent 02/03/2023. CHF, Breast Cancer and Stroke; Risk                 Factors:Hypertension, Diabetes, Dyslipidemia and Former Smoker.   Sonographer:    Milbert Coulter Referring Phys: [id number] CAROLYN GUILLOUD IMPRESSIONS  1. Moderate-large pericardial effusion, primarily over RV, with epicardial fat. IVC is grossly normal. There is no RV diastolic collapse, but there may be subtle decreased diastolic expansion of the RV. No significant respiratory variability of the septum. No respiratory variability of mitral inflow signal. BP on echo 169/69, HR 100. Finding in combination do not suggest tamponade, however given size of effusion, would consider close follow up exam within 2-3 days to ensure no change. Moderate-large effusion.  2. Left ventricular ejection fraction, by estimation, is 30 to 35%. The left ventricle has moderately decreased function. The left ventricle demonstrates global hypokinesis. There is mild left ventricular hypertrophy. Indeterminate diastolic filling due  to E-A fusion.  3. Right ventricular systolic function is mildly reduced. The right ventricular size is normal. Tricuspid regurgitation signal is inadequate for assessing PA pressure.  4. Left atrial size was mildly dilated.  5. The mitral valve is grossly normal. Mild to moderate mitral valve regurgitation. No evidence of mitral stenosis.  6. The aortic valve is tricuspid. Aortic valve regurgitation is mild to moderate (BP noted 169/69). No aortic stenosis is present. Aortic regurgitation PHT measures 360 msec.  7. The inferior vena cava is normal in size with <50% respiratory variability, suggesting right atrial pressure of 8 mmHg. FINDINGS  Left Ventricle: Left ventricular ejection fraction, by estimation, is 30 to 35%. The left ventricle has moderately decreased function. The left ventricle demonstrates global hypokinesis. The left ventricular internal cavity size was normal in size. There is mild left ventricular hypertrophy. Indeterminate diastolic filling due to E-A fusion. Right Ventricle: The right ventricular size is normal. No increase in right ventricular wall  thickness. Right ventricular systolic function is mildly reduced. Tricuspid regurgitation signal is inadequate for assessing PA pressure. Left Atrium: Left atrial size was mildly dilated. Right Atrium: Right atrial size was normal in size. Pericardium: Moderate-large pericardial effusion, primarily over RV, with epicardial fat. IVC is grossly normal. There is no RV diastolic collapse, but there may be subtle decreased diastolic expansion of the RV. No significant respiratory variability of  the septum. No respiratory variability of mitral inflow signal. BP on echo 169/69, HR 100. Finding in combination do not suggest tamponade, however given size of effusion, would consider close follow up exam within 2-3 days to ensure no change. Moderate-large effusion. Mitral Valve: The mitral valve is grossly normal. Mild to moderate mitral valve regurgitation. No evidence of mitral valve stenosis. Tricuspid Valve: The tricuspid valve is normal in structure. Tricuspid valve regurgitation is trivial. No evidence of tricuspid stenosis. Aortic Valve: The aortic valve is tricuspid. Aortic valve regurgitation is mild to moderate. Aortic regurgitation PHT measures 360 msec. No aortic stenosis is present. Aortic valve mean gradient measures 8.0 mmHg. Aortic valve peak gradient measures 15.2  mmHg. Aortic  valve area, by VTI measures 1.87 cm. Pulmonic Valve: The pulmonic valve was normal in structure. Pulmonic valve regurgitation is trivial. No evidence of pulmonic stenosis. Aorta: The aortic root is normal in size and structure. Venous: The inferior vena cava is normal in size with less than 50% respiratory variability, suggesting right atrial pressure of 8 mmHg. IAS/Shunts: The interatrial septum was not well visualized.  LEFT VENTRICLE PLAX 2D LVIDd:         4.50 cm   Diastology LVIDs:         3.50 cm   LV e' medial:    5.33 cm/s LV PW:         1.20 cm   LV E/e' medial:  11.0 LV IVS:        1.20 cm   LV e' lateral:   4.35 cm/s LVOT  diam:     1.90 cm   LV E/e' lateral: 13.5 LV SV:         51 LV SV Index:   38 LVOT Area:     2.84 cm  RIGHT VENTRICLE RV Basal diam:  2.30 cm RV Mid diam:    1.90 cm RV S prime:     11.10 cm/s TAPSE (M-mode): 2.0 cm LEFT ATRIUM             Index        RIGHT ATRIUM           Index LA diam:        2.90 cm 2.18 cm/m   RA Area:     10.90 cm LA Vol (A2C):   49.7 ml 37.38 ml/m  RA Volume:   22.00 ml  16.55 ml/m LA Vol (A4C):   36.8 ml 27.68 ml/m LA Biplane Vol: 45.3 ml 34.07 ml/m  AORTIC VALVE AV Area (Vmax):    1.96 cm AV Area (Vmean):   1.77 cm AV Area (VTI):     1.87 cm AV Vmax:           195.00 cm/s AV Vmean:          127.000 cm/s AV VTI:            0.272 m AV Peak Grad:      15.2 mmHg AV Mean Grad:      8.0 mmHg LVOT Vmax:         135.00 cm/s LVOT Vmean:        79.100 cm/s LVOT VTI:          0.179 m LVOT/AV VTI ratio: 0.66 AI PHT:            360 msec  AORTA Ao Root diam: 2.70 cm MITRAL VALVE MV Area (PHT): 5.97 cm     SHUNTS MV Decel Time: 127 msec     Systemic VTI:  0.18 m MR Peak grad: 121.9 mmHg    Systemic Diam: 1.90 cm MR Mean grad: 86.0 mmHg MR Vmax:      552.00 cm/s MR Vmean:     450.0 cm/s MV E velocity: 58.70 cm/s MV A velocity: 119.00 cm/s MV E/A ratio:  0.49 Weston Brass MD Electronically signed by Weston Brass MD Signature Date/Time: 09/13/2023/2:04:08 PM    Final    CT Angio Chest PE W and/or Wo Contrast  Result Date: 09/12/2023 CLINICAL DATA:  High probability for PE. EXAM: CT ANGIOGRAPHY CHEST WITH CONTRAST TECHNIQUE: Multidetector CT imaging of the chest was performed using the standard protocol during bolus administration of intravenous contrast. Multiplanar CT  image reconstructions and MIPs were obtained to evaluate the vascular anatomy. RADIATION DOSE REDUCTION: This exam was performed according to the departmental dose-optimization program which includes automated exposure control, adjustment of the mA and/or kV according to patient size and/or use of iterative  reconstruction technique. CONTRAST:  75mL OMNIPAQUE IOHEXOL 350 MG/ML SOLN COMPARISON:  None Available. FINDINGS: Cardiovascular: Heart is significantly enlarged. There is a small pericardial effusion. Aorta is normal in size. There are atherosclerotic calcifications of the aorta. There is adequate opacification of the pulmonary arteries to the segmental level. There is no evidence for pulmonary embolism. Mediastinum/Nodes: No enlarged mediastinal, hilar, or axillary lymph nodes. Thyroid gland, trachea, and esophagus demonstrate no significant findings. Lungs/Pleura: Small left and moderate right pleural effusions are present. There is no pneumothorax. Moderate emphysematous changes are present. Patchy ground-glass opacities are seen in the bilateral lower lobes and right middle lobe. Peripheral nodular density in the right upper lobe measures 5 mm image 4/61. There is bilateral lower lobe and right middle lobe peribronchial wall thickening. Upper Abdomen: No acute abnormality. Musculoskeletal: No chest wall abnormality. No acute or significant osseous findings. Review of the MIP images confirms the above findings. IMPRESSION: 1. No evidence for pulmonary embolism. 2. Cardiomegaly with small pericardial effusion. 3. Moderate right and small left pleural effusions. 4. Patchy ground-glass opacities in the bilateral lower lobes and right middle lobe worrisome for infection. 5. Bilateral lower lobe and right middle lobe peribronchial wall thickening worrisome for bronchitis 6. Right solid pulmonary nodule within the upper lobe measuring 5 mm. Per Fleischner Society Guidelines,if patient is low risk for malignancy, no routine follow-up imaging is recommended. If patient is high risk for malignancy, a non-contrast Chest CT at 12 months is optional. If performed and the nodule is stable at 12 months, no further follow-up is recommended. These guidelines do not apply to immunocompromised patients and patients with cancer.  Follow up in patients with significant comorbidities as clinically warranted. For lung cancer screening, adhere to Lung-RADS guidelines. Reference: Radiology. 2017; 284(1):228-43. 7. Moderate emphysema. Aortic Atherosclerosis (ICD10-I70.0) and Emphysema (ICD10-J43.9). Electronically Signed   By: Darliss Cheney M.D.   On: 09/12/2023 19:49   CT ABDOMEN PELVIS W CONTRAST  Result Date: 09/12/2023 CLINICAL DATA:  Shortness of breath and diarrhea for the last month. Acute nonlocalized abdominal pain EXAM: CT ABDOMEN AND PELVIS WITH CONTRAST TECHNIQUE: Multidetector CT imaging of the abdomen and pelvis was performed using the standard protocol following bolus administration of intravenous contrast. RADIATION DOSE REDUCTION: This exam was performed according to the departmental dose-optimization program which includes automated exposure control, adjustment of the mA and/or kV according to patient size and/or use of iterative reconstruction technique. CONTRAST:  75mL OMNIPAQUE IOHEXOL 350 MG/ML SOLN COMPARISON:  CT abdomen and pelvis 03/27/2017 FINDINGS: Lower chest: Bilateral pleural effusions and ground-glass opacities. See separate report for detailed findings of the chest. Hepatobiliary: Unremarkable. Pancreas: Unremarkable. Spleen: Unremarkable. Adrenals/Urinary Tract: Unremarkable adrenal glands. Low-attenuation lesions in the kidneys are statistically likely to represent cysts. No follow-up is required. No urinary calculi or hydronephrosis. Unremarkable bladder. Stomach/Bowel: Extensive sigmoid diverticulosis. Mild wall thickening with trace fluid about the sigmoid colon suggesting diverticulitis. No obstruction. Stomach and appendix within limits. Vascular/Lymphatic: Aortic atherosclerosis. No enlarged abdominal or pelvic lymph nodes. Unremarkable. Reproductive: Hysterectomy.  No adnexal mass. Other: No free intraperitoneal air. Musculoskeletal: No acute fracture. IMPRESSION: 1. Possible mild uncomplicated  sigmoid diverticulitis. 2. Bilateral pleural effusions and ground-glass opacities. See separate report for detailed findings of the chest Aortic Atherosclerosis (  ICD10-I70.0). Electronically Signed   By: Minerva Fester M.D.   On: 09/12/2023 19:47   DG Chest 2 View  Result Date: 09/12/2023 CLINICAL DATA:  Shortness of breath EXAM: CHEST - 2 VIEW COMPARISON:  X-ray 11/21/2022 FINDINGS: Hyperinflation. Increasing small pleural effusions with the adjacent parenchymal opacities. There also some nodular areas suggested such as right midlung and right upper lung. No pneumothorax. Enlarged cardiopericardial silhouette with calcified aorta. Osteopenia. Degenerative changes of the spine IMPRESSION: Slightly enlarged heart with vascular congestion and some interstitial edema. Increasing pleural effusions with the adjacent opacities. There is also a suggestion of some nodular areas in the lung on the right. Although this could be infiltrative underlying lesions are possible. Overall recommend a follow-up contrast chest CT when appropriate Electronically Signed   By: Karen Kays M.D.   On: 09/12/2023 15:12    Labs:  CBC: Recent Labs    09/14/23 0752 09/15/23 0332 09/16/23 0451 09/17/23 0539  WBC 8.2 6.8 6.2 4.5  HGB 12.2 12.1 11.5* 12.7  HCT 38.7 38.0 36.2 39.5  PLT 181 182 202 217    COAGS: No results for input(s): "INR", "APTT" in the last 8760 hours.  BMP: Recent Labs    09/14/23 0352 09/15/23 0332 09/15/23 1545 09/16/23 0451 09/17/23 0539  NA 133* 131*  --  132* 134*  K 4.6 5.7* 4.7 4.5 4.1  CL 101 102  --  100 99  CO2 21* 18*  --  17* 20*  GLUCOSE 73 79  --  69* 86  BUN 15 17  --  18 14  CALCIUM 8.6* 9.1  --  9.4 9.7  CREATININE 1.36* 1.39*  --  1.80* 1.66*  GFRNONAA 42* 41*  --  30* 33*    LIVER FUNCTION TESTS: Recent Labs    11/21/22 0135 02/07/23 0829 02/07/23 0829 06/30/23 0805 09/12/23 1249 09/15/23 0332 09/17/23 0539  BILITOT 0.5 0.2  --  0.2 0.9  --   --   AST  40 18  --  17 55*  --   --   ALT 25 14  --  12 67*  --   --   ALKPHOS 103 95  --  103 132*  --   --   PROT 7.5 7.4  --  7.3 6.9  --   --   ALBUMIN 3.5 4.5   < > 4.1 3.3* 2.9* 3.1*   < > = values in this interval not displayed.    TUMOR MARKERS: No results for input(s): "AFPTM", "CEA", "CA199", "CHROMGRNA" in the last 8760 hours.  Assessment and Plan:  Scheduled for Cerebral arteriogram today in [facility] Risks and benefits of cerebral angiogram with intervention were discussed with the patient including, but not limited to bleeding, infection, vascular injury, contrast induced renal failure, stroke or even death.  This interventional procedure involves the use of X-rays and because of the nature of the planned procedure, it is possible that we will have prolonged use of X-ray fluoroscopy.  Potential radiation risks to you include (but are not limited to) the following: - A slightly elevated risk for cancer  several years later in life. This risk is typically less than 0.5% percent. This risk is low in comparison to the normal incidence of human cancer, which is 33% for women and 50% for men according to the [employer]. - Radiation induced injury can include skin redness, resembling a rash, tissue breakdown / ulcers and hair loss (which can be temporary or permanent).  The likelihood of either of these occurring depends on the difficulty of the procedure and whether you are sensitive to radiation due to previous procedures, disease, or genetic conditions.   IF your procedure requires a prolonged use of radiation, you will be notified and given written instructions for further action.  It is your responsibility to monitor the irradiated area for the 2 weeks following the procedure and to notify your physician if you are concerned that you have suffered a radiation induced injury.    All of the patient's questions were answered, patient is agreeable to proceed.  Consent signed and  in chart.  Thank you for this interesting consult.  I greatly enjoyed meeting Dallyce Runde and look forward to participating in their care.  A copy of this report was sent to the requesting provider on this date.  Electronically Signed: Robet Leu, PA-C 09/17/2023, 7:25 AM   I spent a total of 20 Minutes    in face to face in clinical consultation, greater than 50% of which was counseling/coordinating care for Cerebral arteriogram

## 2023-09-18 ENCOUNTER — Other Ambulatory Visit (HOSPITAL_COMMUNITY): Payer: Self-pay

## 2023-09-18 DIAGNOSIS — I5023 Acute on chronic systolic (congestive) heart failure: Secondary | ICD-10-CM | POA: Diagnosis not present

## 2023-09-18 LAB — RENAL FUNCTION PANEL
Albumin: 2.7 g/dL — ABNORMAL LOW (ref 3.5–5.0)
Anion gap: 12 (ref 5–15)
BUN: 16 mg/dL (ref 8–23)
CO2: 21 mmol/L — ABNORMAL LOW (ref 22–32)
Calcium: 9.5 mg/dL (ref 8.9–10.3)
Chloride: 100 mmol/L (ref 98–111)
Creatinine, Ser: 1.32 mg/dL — ABNORMAL HIGH (ref 0.44–1.00)
GFR, Estimated: 44 mL/min — ABNORMAL LOW (ref >60)
Glucose, Bld: 94 mg/dL (ref 70–99)
Phosphorus: 3.9 mg/dL (ref 2.5–4.6)
Potassium: 4 mmol/L (ref 3.5–5.1)
Sodium: 133 mmol/L — ABNORMAL LOW (ref 135–145)

## 2023-09-18 LAB — CBC
HCT: 34.8 % — ABNORMAL LOW (ref 36.0–46.0)
Hemoglobin: 11.2 g/dL — ABNORMAL LOW (ref 12.0–15.0)
MCH: 29.8 pg (ref 26.0–34.0)
MCHC: 32.2 g/dL (ref 30.0–36.0)
MCV: 92.6 fL (ref 80.0–100.0)
Platelets: 193 10*3/uL (ref 150–400)
RBC: 3.76 MIL/uL — ABNORMAL LOW (ref 3.87–5.11)
RDW: 14.5 % (ref 11.5–15.5)
WBC: 5.9 10*3/uL (ref 4.0–10.5)
nRBC: 0 % (ref 0.0–0.2)

## 2023-09-18 MED ORDER — TICAGRELOR 90 MG PO TABS
90.0000 mg | ORAL_TABLET | Freq: Two times a day (BID) | ORAL | 0 refills | Status: DC
  Filled 2023-09-18 – 2023-10-01 (×2): qty 60, 30d supply, fill #0

## 2023-09-18 MED ORDER — CARVEDILOL 6.25 MG PO TABS
6.2500 mg | ORAL_TABLET | Freq: Two times a day (BID) | ORAL | 0 refills | Status: DC
  Filled 2023-09-18 – 2023-10-01 (×2): qty 60, 30d supply, fill #0

## 2023-09-18 MED ORDER — ENSURE ENLIVE PO LIQD
237.0000 mL | Freq: Three times a day (TID) | ORAL | 12 refills | Status: AC
Start: 2023-09-18 — End: ?
  Filled 2023-09-18: qty 237, 1d supply, fill #0

## 2023-09-18 MED ORDER — ADULT MULTIVITAMIN W/MINERALS CH
1.0000 | ORAL_TABLET | Freq: Every day | ORAL | 0 refills | Status: DC
  Filled 2023-09-18: qty 30, 30d supply, fill #0

## 2023-09-18 MED ORDER — MIRTAZAPINE 15 MG PO TBDP
7.5000 mg | ORAL_TABLET | Freq: Every day | ORAL | 0 refills | Status: DC
Start: 2023-09-18 — End: 2023-10-01
  Filled 2023-09-18: qty 15, 30d supply, fill #0

## 2023-09-18 NOTE — Discharge Summary (Addendum)
Name: Amy Howell MRN: [id number] DOB: October 22, 1956 67 y.o. PCP: Rana Snare, DO  Date of Admission: 09/12/2023 12:28 PM Date of Discharge: 09/18/2023  Attending Physician: Dr.  Sol Blazing  DISCHARGE DIAGNOSIS:  Primary Problem: Acute on chronic HFrEF (heart failure with reduced ejection fraction) [facility])   Hospital Problems: Principal Problem:   Acute on chronic HFrEF (heart failure with reduced ejection fraction) (HCC) Active Problems:   Hypertension   Lung nodule   Elevated serum creatinine   Diarrhea   Protein-calorie malnutrition, severe (HCC)   Metabolic acidosis   Underweight   Pleural effusion   Acute stroke due to ischemia [facility])   Pericardial effusion   Ischemic cardiomyopathy   Hypertensive emergency    DISCHARGE MEDICATIONS:   Allergies as of 09/18/2023       Reactions   Lisinopril Other (See Comments)   Angioedema   Pork-derived Products Hives   Rosuvastatin Other (See Comments)   headache        Medication List     STOP taking these medications    hydrochlorothiazide 12.5 MG tablet Commonly known as: HYDRODIURIL   spironolactone 25 MG tablet Commonly known as: ALDACTONE       TAKE these medications    aspirin 81 MG chewable tablet Chew 1 tablet (81 mg total) by mouth daily. What changed: Another medication with the same name was removed. Continue taking this medication, and follow the directions you see here.   atorvastatin 40 MG tablet Commonly known as: LIPITOR Take 1 tablet (40 mg total) by mouth daily.   carvedilol 6.25 MG tablet Commonly known as: COREG Take 1 tablet (6.25 mg total) by mouth 2 (two) times daily. Start taking 09/19/23 What changed: additional instructions   dorzolamide 2 % ophthalmic solution Commonly known as: TRUSOPT Instill 1 drop into both eyes twice a day   feeding supplement Liqd Take 237 mLs by mouth 3 (three) times daily between meals. What changed: when to take this   latanoprost 0.005 % ophthalmic  solution Commonly known as: XALATAN Instill 1 drop into both eyes at bedtime   mirtazapine 15 MG disintegrating tablet Commonly known as: REMERON SOL-TAB Dissolve 1/2 tablet (7.5 mg total) in mouth at bedtime.   multivitamin with minerals Tabs tablet Take 1 tablet by mouth daily. Start taking on: September 19, 2023   ticagrelor 90 MG Tabs tablet Commonly known as: BRILINTA Take 1 tablet (90 mg total) by mouth 2 (two) times daily. What changed: how much to take   timolol 0.5 % ophthalmic solution Commonly known as: TIMOPTIC Place 1 drop into both eyes every morning.        DISPOSITION AND FOLLOW-UP:  Amy Howell was discharged from [facility] in Good condition. At the hospital follow up visit please address:  Follow-up Recommendations: Labs: Basic Metabolic Profile and CBC Studies: Echo, CXR  Medications: Mirtazapine, GDMT   Follow-up Appointments:  Follow-up Information     Branch, Alben Spittle, MD. Schedule an appointment as soon as possible for a visit today.   Specialty: Cardiology Why: Hospital Follow-up within 2 weeks Contact information: [address] [place] [address] [phone]         Olegario Messier, MD Follow up in 16 day(s).   Specialty: Internal Medicine Why: 10/01/2023 at  03:15pm For a hospital Follow-up Contact information: [address] [place] [address] [phone]         [facility]. Go in 6 day(s).  8.9 - 10.3 mg/dL 9.5  9.7  9.4     ECHOCARDIOGRAM COMPLETE  Result Date: 09/13/2023    ECHOCARDIOGRAM REPORT   Patient Name:   Amy Howell Date of Exam: 09/13/2023 Medical Rec #:  [id number]     Height:       60.0 in Accession #:    [id number]    Weight:       90.0 lb Date of Birth:  1956/06/21      BSA:          1.329 m Patient Age:    67 years      BP:           167/69 mmHg Patient Gender: F             HR:           100 bpm. Exam Location:  Inpatient Procedure: 2D Echo, Color Doppler and Cardiac Doppler Indications:    CHF  History:        Patient has prior history of Echocardiogram examinations, most                 recent 02/03/2023. CHF, Breast Cancer and Stroke; Risk                 Factors:Hypertension, Diabetes, Dyslipidemia and Former Smoker.  Sonographer:    Milbert Coulter Referring Phys: [id number] CAROLYN GUILLOUD IMPRESSIONS  1. Moderate-large pericardial effusion, primarily over RV, with epicardial fat. IVC is grossly normal. There is no RV diastolic collapse, but there may be subtle decreased diastolic expansion of the RV. No significant respiratory variability of the septum. No respiratory variability of mitral inflow signal. BP on echo 169/69, HR 100. Finding in combination do not suggest tamponade,  however given size of effusion, would consider close follow up exam within 2-3 days to ensure no change. Moderate-large effusion.  2. Left ventricular ejection fraction, by estimation, is 30 to 35%. The left ventricle has moderately decreased function. The left ventricle demonstrates global hypokinesis. There is mild left ventricular hypertrophy. Indeterminate diastolic filling due  to E-A fusion.  3. Right ventricular systolic function is mildly reduced. The right ventricular size is normal. Tricuspid regurgitation signal is inadequate for assessing PA pressure.  4. Left atrial size was mildly dilated.  5. The mitral valve is grossly normal. Mild to moderate mitral valve regurgitation. No evidence of mitral stenosis.  6. The aortic valve is tricuspid. Aortic valve regurgitation is mild to moderate (BP noted 169/69). No aortic stenosis is present. Aortic regurgitation PHT measures 360 msec.  7. The inferior vena cava is normal in size with <50% respiratory variability, suggesting right atrial pressure of 8 mmHg. FINDINGS  Left Ventricle: Left ventricular ejection fraction, by estimation, is 30 to 35%. The left ventricle has moderately decreased function. The left ventricle demonstrates global hypokinesis. The left ventricular internal cavity size was normal in size. There is mild left ventricular hypertrophy. Indeterminate diastolic filling due to E-A fusion. Right Ventricle: The right ventricular size is normal. No increase in right ventricular wall thickness. Right ventricular systolic function is mildly reduced. Tricuspid regurgitation signal is inadequate for assessing PA pressure. Left Atrium: Left atrial size was mildly dilated. Right Atrium: Right atrial size was normal in size. Pericardium: Moderate-large pericardial effusion, primarily over RV, with epicardial fat. IVC is grossly normal. There is no RV diastolic collapse, but there may be subtle decreased diastolic expansion of the RV. No significant  respiratory variability of  the septum. No respiratory variability  8.9 - 10.3 mg/dL 9.5  9.7  9.4     ECHOCARDIOGRAM COMPLETE  Result Date: 09/13/2023    ECHOCARDIOGRAM REPORT   Patient Name:   Amy Howell Date of Exam: 09/13/2023 Medical Rec #:  [id number]     Height:       60.0 in Accession #:    [id number]    Weight:       90.0 lb Date of Birth:  1956/06/21      BSA:          1.329 m Patient Age:    67 years      BP:           167/69 mmHg Patient Gender: F             HR:           100 bpm. Exam Location:  Inpatient Procedure: 2D Echo, Color Doppler and Cardiac Doppler Indications:    CHF  History:        Patient has prior history of Echocardiogram examinations, most                 recent 02/03/2023. CHF, Breast Cancer and Stroke; Risk                 Factors:Hypertension, Diabetes, Dyslipidemia and Former Smoker.  Sonographer:    Milbert Coulter Referring Phys: [id number] CAROLYN GUILLOUD IMPRESSIONS  1. Moderate-large pericardial effusion, primarily over RV, with epicardial fat. IVC is grossly normal. There is no RV diastolic collapse, but there may be subtle decreased diastolic expansion of the RV. No significant respiratory variability of the septum. No respiratory variability of mitral inflow signal. BP on echo 169/69, HR 100. Finding in combination do not suggest tamponade,  however given size of effusion, would consider close follow up exam within 2-3 days to ensure no change. Moderate-large effusion.  2. Left ventricular ejection fraction, by estimation, is 30 to 35%. The left ventricle has moderately decreased function. The left ventricle demonstrates global hypokinesis. There is mild left ventricular hypertrophy. Indeterminate diastolic filling due  to E-A fusion.  3. Right ventricular systolic function is mildly reduced. The right ventricular size is normal. Tricuspid regurgitation signal is inadequate for assessing PA pressure.  4. Left atrial size was mildly dilated.  5. The mitral valve is grossly normal. Mild to moderate mitral valve regurgitation. No evidence of mitral stenosis.  6. The aortic valve is tricuspid. Aortic valve regurgitation is mild to moderate (BP noted 169/69). No aortic stenosis is present. Aortic regurgitation PHT measures 360 msec.  7. The inferior vena cava is normal in size with <50% respiratory variability, suggesting right atrial pressure of 8 mmHg. FINDINGS  Left Ventricle: Left ventricular ejection fraction, by estimation, is 30 to 35%. The left ventricle has moderately decreased function. The left ventricle demonstrates global hypokinesis. The left ventricular internal cavity size was normal in size. There is mild left ventricular hypertrophy. Indeterminate diastolic filling due to E-A fusion. Right Ventricle: The right ventricular size is normal. No increase in right ventricular wall thickness. Right ventricular systolic function is mildly reduced. Tricuspid regurgitation signal is inadequate for assessing PA pressure. Left Atrium: Left atrial size was mildly dilated. Right Atrium: Right atrial size was normal in size. Pericardium: Moderate-large pericardial effusion, primarily over RV, with epicardial fat. IVC is grossly normal. There is no RV diastolic collapse, but there may be subtle decreased diastolic expansion of the RV. No significant  respiratory variability of  the septum. No respiratory variability  8.9 - 10.3 mg/dL 9.5  9.7  9.4     ECHOCARDIOGRAM COMPLETE  Result Date: 09/13/2023    ECHOCARDIOGRAM REPORT   Patient Name:   Amy Howell Date of Exam: 09/13/2023 Medical Rec #:  [id number]     Height:       60.0 in Accession #:    [id number]    Weight:       90.0 lb Date of Birth:  1956/06/21      BSA:          1.329 m Patient Age:    67 years      BP:           167/69 mmHg Patient Gender: F             HR:           100 bpm. Exam Location:  Inpatient Procedure: 2D Echo, Color Doppler and Cardiac Doppler Indications:    CHF  History:        Patient has prior history of Echocardiogram examinations, most                 recent 02/03/2023. CHF, Breast Cancer and Stroke; Risk                 Factors:Hypertension, Diabetes, Dyslipidemia and Former Smoker.  Sonographer:    Milbert Coulter Referring Phys: [id number] CAROLYN GUILLOUD IMPRESSIONS  1. Moderate-large pericardial effusion, primarily over RV, with epicardial fat. IVC is grossly normal. There is no RV diastolic collapse, but there may be subtle decreased diastolic expansion of the RV. No significant respiratory variability of the septum. No respiratory variability of mitral inflow signal. BP on echo 169/69, HR 100. Finding in combination do not suggest tamponade,  however given size of effusion, would consider close follow up exam within 2-3 days to ensure no change. Moderate-large effusion.  2. Left ventricular ejection fraction, by estimation, is 30 to 35%. The left ventricle has moderately decreased function. The left ventricle demonstrates global hypokinesis. There is mild left ventricular hypertrophy. Indeterminate diastolic filling due  to E-A fusion.  3. Right ventricular systolic function is mildly reduced. The right ventricular size is normal. Tricuspid regurgitation signal is inadequate for assessing PA pressure.  4. Left atrial size was mildly dilated.  5. The mitral valve is grossly normal. Mild to moderate mitral valve regurgitation. No evidence of mitral stenosis.  6. The aortic valve is tricuspid. Aortic valve regurgitation is mild to moderate (BP noted 169/69). No aortic stenosis is present. Aortic regurgitation PHT measures 360 msec.  7. The inferior vena cava is normal in size with <50% respiratory variability, suggesting right atrial pressure of 8 mmHg. FINDINGS  Left Ventricle: Left ventricular ejection fraction, by estimation, is 30 to 35%. The left ventricle has moderately decreased function. The left ventricle demonstrates global hypokinesis. The left ventricular internal cavity size was normal in size. There is mild left ventricular hypertrophy. Indeterminate diastolic filling due to E-A fusion. Right Ventricle: The right ventricular size is normal. No increase in right ventricular wall thickness. Right ventricular systolic function is mildly reduced. Tricuspid regurgitation signal is inadequate for assessing PA pressure. Left Atrium: Left atrial size was mildly dilated. Right Atrium: Right atrial size was normal in size. Pericardium: Moderate-large pericardial effusion, primarily over RV, with epicardial fat. IVC is grossly normal. There is no RV diastolic collapse, but there may be subtle decreased diastolic expansion of the RV. No significant  respiratory variability of  the septum. No respiratory variability  8.9 - 10.3 mg/dL 9.5  9.7  9.4     ECHOCARDIOGRAM COMPLETE  Result Date: 09/13/2023    ECHOCARDIOGRAM REPORT   Patient Name:   Amy Howell Date of Exam: 09/13/2023 Medical Rec #:  [id number]     Height:       60.0 in Accession #:    [id number]    Weight:       90.0 lb Date of Birth:  1956/06/21      BSA:          1.329 m Patient Age:    67 years      BP:           167/69 mmHg Patient Gender: F             HR:           100 bpm. Exam Location:  Inpatient Procedure: 2D Echo, Color Doppler and Cardiac Doppler Indications:    CHF  History:        Patient has prior history of Echocardiogram examinations, most                 recent 02/03/2023. CHF, Breast Cancer and Stroke; Risk                 Factors:Hypertension, Diabetes, Dyslipidemia and Former Smoker.  Sonographer:    Milbert Coulter Referring Phys: [id number] CAROLYN GUILLOUD IMPRESSIONS  1. Moderate-large pericardial effusion, primarily over RV, with epicardial fat. IVC is grossly normal. There is no RV diastolic collapse, but there may be subtle decreased diastolic expansion of the RV. No significant respiratory variability of the septum. No respiratory variability of mitral inflow signal. BP on echo 169/69, HR 100. Finding in combination do not suggest tamponade,  however given size of effusion, would consider close follow up exam within 2-3 days to ensure no change. Moderate-large effusion.  2. Left ventricular ejection fraction, by estimation, is 30 to 35%. The left ventricle has moderately decreased function. The left ventricle demonstrates global hypokinesis. There is mild left ventricular hypertrophy. Indeterminate diastolic filling due  to E-A fusion.  3. Right ventricular systolic function is mildly reduced. The right ventricular size is normal. Tricuspid regurgitation signal is inadequate for assessing PA pressure.  4. Left atrial size was mildly dilated.  5. The mitral valve is grossly normal. Mild to moderate mitral valve regurgitation. No evidence of mitral stenosis.  6. The aortic valve is tricuspid. Aortic valve regurgitation is mild to moderate (BP noted 169/69). No aortic stenosis is present. Aortic regurgitation PHT measures 360 msec.  7. The inferior vena cava is normal in size with <50% respiratory variability, suggesting right atrial pressure of 8 mmHg. FINDINGS  Left Ventricle: Left ventricular ejection fraction, by estimation, is 30 to 35%. The left ventricle has moderately decreased function. The left ventricle demonstrates global hypokinesis. The left ventricular internal cavity size was normal in size. There is mild left ventricular hypertrophy. Indeterminate diastolic filling due to E-A fusion. Right Ventricle: The right ventricular size is normal. No increase in right ventricular wall thickness. Right ventricular systolic function is mildly reduced. Tricuspid regurgitation signal is inadequate for assessing PA pressure. Left Atrium: Left atrial size was mildly dilated. Right Atrium: Right atrial size was normal in size. Pericardium: Moderate-large pericardial effusion, primarily over RV, with epicardial fat. IVC is grossly normal. There is no RV diastolic collapse, but there may be subtle decreased diastolic expansion of the RV. No significant  respiratory variability of  the septum. No respiratory variability  8.9 - 10.3 mg/dL 9.5  9.7  9.4     ECHOCARDIOGRAM COMPLETE  Result Date: 09/13/2023    ECHOCARDIOGRAM REPORT   Patient Name:   Amy Howell Date of Exam: 09/13/2023 Medical Rec #:  [id number]     Height:       60.0 in Accession #:    [id number]    Weight:       90.0 lb Date of Birth:  1956/06/21      BSA:          1.329 m Patient Age:    67 years      BP:           167/69 mmHg Patient Gender: F             HR:           100 bpm. Exam Location:  Inpatient Procedure: 2D Echo, Color Doppler and Cardiac Doppler Indications:    CHF  History:        Patient has prior history of Echocardiogram examinations, most                 recent 02/03/2023. CHF, Breast Cancer and Stroke; Risk                 Factors:Hypertension, Diabetes, Dyslipidemia and Former Smoker.  Sonographer:    Milbert Coulter Referring Phys: [id number] CAROLYN GUILLOUD IMPRESSIONS  1. Moderate-large pericardial effusion, primarily over RV, with epicardial fat. IVC is grossly normal. There is no RV diastolic collapse, but there may be subtle decreased diastolic expansion of the RV. No significant respiratory variability of the septum. No respiratory variability of mitral inflow signal. BP on echo 169/69, HR 100. Finding in combination do not suggest tamponade,  however given size of effusion, would consider close follow up exam within 2-3 days to ensure no change. Moderate-large effusion.  2. Left ventricular ejection fraction, by estimation, is 30 to 35%. The left ventricle has moderately decreased function. The left ventricle demonstrates global hypokinesis. There is mild left ventricular hypertrophy. Indeterminate diastolic filling due  to E-A fusion.  3. Right ventricular systolic function is mildly reduced. The right ventricular size is normal. Tricuspid regurgitation signal is inadequate for assessing PA pressure.  4. Left atrial size was mildly dilated.  5. The mitral valve is grossly normal. Mild to moderate mitral valve regurgitation. No evidence of mitral stenosis.  6. The aortic valve is tricuspid. Aortic valve regurgitation is mild to moderate (BP noted 169/69). No aortic stenosis is present. Aortic regurgitation PHT measures 360 msec.  7. The inferior vena cava is normal in size with <50% respiratory variability, suggesting right atrial pressure of 8 mmHg. FINDINGS  Left Ventricle: Left ventricular ejection fraction, by estimation, is 30 to 35%. The left ventricle has moderately decreased function. The left ventricle demonstrates global hypokinesis. The left ventricular internal cavity size was normal in size. There is mild left ventricular hypertrophy. Indeterminate diastolic filling due to E-A fusion. Right Ventricle: The right ventricular size is normal. No increase in right ventricular wall thickness. Right ventricular systolic function is mildly reduced. Tricuspid regurgitation signal is inadequate for assessing PA pressure. Left Atrium: Left atrial size was mildly dilated. Right Atrium: Right atrial size was normal in size. Pericardium: Moderate-large pericardial effusion, primarily over RV, with epicardial fat. IVC is grossly normal. There is no RV diastolic collapse, but there may be subtle decreased diastolic expansion of the RV. No significant  respiratory variability of  the septum. No respiratory variability  Name: Amy Howell MRN: [id number] DOB: October 22, 1956 67 y.o. PCP: Rana Snare, DO  Date of Admission: 09/12/2023 12:28 PM Date of Discharge: 09/18/2023  Attending Physician: Dr.  Sol Blazing  DISCHARGE DIAGNOSIS:  Primary Problem: Acute on chronic HFrEF (heart failure with reduced ejection fraction) [facility])   Hospital Problems: Principal Problem:   Acute on chronic HFrEF (heart failure with reduced ejection fraction) (HCC) Active Problems:   Hypertension   Lung nodule   Elevated serum creatinine   Diarrhea   Protein-calorie malnutrition, severe (HCC)   Metabolic acidosis   Underweight   Pleural effusion   Acute stroke due to ischemia [facility])   Pericardial effusion   Ischemic cardiomyopathy   Hypertensive emergency    DISCHARGE MEDICATIONS:   Allergies as of 09/18/2023       Reactions   Lisinopril Other (See Comments)   Angioedema   Pork-derived Products Hives   Rosuvastatin Other (See Comments)   headache        Medication List     STOP taking these medications    hydrochlorothiazide 12.5 MG tablet Commonly known as: HYDRODIURIL   spironolactone 25 MG tablet Commonly known as: ALDACTONE       TAKE these medications    aspirin 81 MG chewable tablet Chew 1 tablet (81 mg total) by mouth daily. What changed: Another medication with the same name was removed. Continue taking this medication, and follow the directions you see here.   atorvastatin 40 MG tablet Commonly known as: LIPITOR Take 1 tablet (40 mg total) by mouth daily.   carvedilol 6.25 MG tablet Commonly known as: COREG Take 1 tablet (6.25 mg total) by mouth 2 (two) times daily. Start taking 09/19/23 What changed: additional instructions   dorzolamide 2 % ophthalmic solution Commonly known as: TRUSOPT Instill 1 drop into both eyes twice a day   feeding supplement Liqd Take 237 mLs by mouth 3 (three) times daily between meals. What changed: when to take this   latanoprost 0.005 % ophthalmic  solution Commonly known as: XALATAN Instill 1 drop into both eyes at bedtime   mirtazapine 15 MG disintegrating tablet Commonly known as: REMERON SOL-TAB Dissolve 1/2 tablet (7.5 mg total) in mouth at bedtime.   multivitamin with minerals Tabs tablet Take 1 tablet by mouth daily. Start taking on: September 19, 2023   ticagrelor 90 MG Tabs tablet Commonly known as: BRILINTA Take 1 tablet (90 mg total) by mouth 2 (two) times daily. What changed: how much to take   timolol 0.5 % ophthalmic solution Commonly known as: TIMOPTIC Place 1 drop into both eyes every morning.        DISPOSITION AND FOLLOW-UP:  Amy Howell was discharged from [facility] in Good condition. At the hospital follow up visit please address:  Follow-up Recommendations: Labs: Basic Metabolic Profile and CBC Studies: Echo, CXR  Medications: Mirtazapine, GDMT   Follow-up Appointments:  Follow-up Information     Branch, Alben Spittle, MD. Schedule an appointment as soon as possible for a visit today.   Specialty: Cardiology Why: Hospital Follow-up within 2 weeks Contact information: [address] [place] [address] [phone]         Olegario Messier, MD Follow up in 16 day(s).   Specialty: Internal Medicine Why: 10/01/2023 at  03:15pm For a hospital Follow-up Contact information: [address] [place] [address] [phone]         [facility]. Go in 6 day(s).  Name: Amy Howell MRN: [id number] DOB: October 22, 1956 67 y.o. PCP: Rana Snare, DO  Date of Admission: 09/12/2023 12:28 PM Date of Discharge: 09/18/2023  Attending Physician: Dr.  Sol Blazing  DISCHARGE DIAGNOSIS:  Primary Problem: Acute on chronic HFrEF (heart failure with reduced ejection fraction) [facility])   Hospital Problems: Principal Problem:   Acute on chronic HFrEF (heart failure with reduced ejection fraction) (HCC) Active Problems:   Hypertension   Lung nodule   Elevated serum creatinine   Diarrhea   Protein-calorie malnutrition, severe (HCC)   Metabolic acidosis   Underweight   Pleural effusion   Acute stroke due to ischemia [facility])   Pericardial effusion   Ischemic cardiomyopathy   Hypertensive emergency    DISCHARGE MEDICATIONS:   Allergies as of 09/18/2023       Reactions   Lisinopril Other (See Comments)   Angioedema   Pork-derived Products Hives   Rosuvastatin Other (See Comments)   headache        Medication List     STOP taking these medications    hydrochlorothiazide 12.5 MG tablet Commonly known as: HYDRODIURIL   spironolactone 25 MG tablet Commonly known as: ALDACTONE       TAKE these medications    aspirin 81 MG chewable tablet Chew 1 tablet (81 mg total) by mouth daily. What changed: Another medication with the same name was removed. Continue taking this medication, and follow the directions you see here.   atorvastatin 40 MG tablet Commonly known as: LIPITOR Take 1 tablet (40 mg total) by mouth daily.   carvedilol 6.25 MG tablet Commonly known as: COREG Take 1 tablet (6.25 mg total) by mouth 2 (two) times daily. Start taking 09/19/23 What changed: additional instructions   dorzolamide 2 % ophthalmic solution Commonly known as: TRUSOPT Instill 1 drop into both eyes twice a day   feeding supplement Liqd Take 237 mLs by mouth 3 (three) times daily between meals. What changed: when to take this   latanoprost 0.005 % ophthalmic  solution Commonly known as: XALATAN Instill 1 drop into both eyes at bedtime   mirtazapine 15 MG disintegrating tablet Commonly known as: REMERON SOL-TAB Dissolve 1/2 tablet (7.5 mg total) in mouth at bedtime.   multivitamin with minerals Tabs tablet Take 1 tablet by mouth daily. Start taking on: September 19, 2023   ticagrelor 90 MG Tabs tablet Commonly known as: BRILINTA Take 1 tablet (90 mg total) by mouth 2 (two) times daily. What changed: how much to take   timolol 0.5 % ophthalmic solution Commonly known as: TIMOPTIC Place 1 drop into both eyes every morning.        DISPOSITION AND FOLLOW-UP:  Amy Howell was discharged from [facility] in Good condition. At the hospital follow up visit please address:  Follow-up Recommendations: Labs: Basic Metabolic Profile and CBC Studies: Echo, CXR  Medications: Mirtazapine, GDMT   Follow-up Appointments:  Follow-up Information     Branch, Alben Spittle, MD. Schedule an appointment as soon as possible for a visit today.   Specialty: Cardiology Why: Hospital Follow-up within 2 weeks Contact information: [address] [place] [address] [phone]         Olegario Messier, MD Follow up in 16 day(s).   Specialty: Internal Medicine Why: 10/01/2023 at  03:15pm For a hospital Follow-up Contact information: [address] [place] [address] [phone]         [facility]. Go in 6 day(s).  Name: Amy Howell MRN: [id number] DOB: October 22, 1956 67 y.o. PCP: Rana Snare, DO  Date of Admission: 09/12/2023 12:28 PM Date of Discharge: 09/18/2023  Attending Physician: Dr.  Sol Blazing  DISCHARGE DIAGNOSIS:  Primary Problem: Acute on chronic HFrEF (heart failure with reduced ejection fraction) [facility])   Hospital Problems: Principal Problem:   Acute on chronic HFrEF (heart failure with reduced ejection fraction) (HCC) Active Problems:   Hypertension   Lung nodule   Elevated serum creatinine   Diarrhea   Protein-calorie malnutrition, severe (HCC)   Metabolic acidosis   Underweight   Pleural effusion   Acute stroke due to ischemia [facility])   Pericardial effusion   Ischemic cardiomyopathy   Hypertensive emergency    DISCHARGE MEDICATIONS:   Allergies as of 09/18/2023       Reactions   Lisinopril Other (See Comments)   Angioedema   Pork-derived Products Hives   Rosuvastatin Other (See Comments)   headache        Medication List     STOP taking these medications    hydrochlorothiazide 12.5 MG tablet Commonly known as: HYDRODIURIL   spironolactone 25 MG tablet Commonly known as: ALDACTONE       TAKE these medications    aspirin 81 MG chewable tablet Chew 1 tablet (81 mg total) by mouth daily. What changed: Another medication with the same name was removed. Continue taking this medication, and follow the directions you see here.   atorvastatin 40 MG tablet Commonly known as: LIPITOR Take 1 tablet (40 mg total) by mouth daily.   carvedilol 6.25 MG tablet Commonly known as: COREG Take 1 tablet (6.25 mg total) by mouth 2 (two) times daily. Start taking 09/19/23 What changed: additional instructions   dorzolamide 2 % ophthalmic solution Commonly known as: TRUSOPT Instill 1 drop into both eyes twice a day   feeding supplement Liqd Take 237 mLs by mouth 3 (three) times daily between meals. What changed: when to take this   latanoprost 0.005 % ophthalmic  solution Commonly known as: XALATAN Instill 1 drop into both eyes at bedtime   mirtazapine 15 MG disintegrating tablet Commonly known as: REMERON SOL-TAB Dissolve 1/2 tablet (7.5 mg total) in mouth at bedtime.   multivitamin with minerals Tabs tablet Take 1 tablet by mouth daily. Start taking on: September 19, 2023   ticagrelor 90 MG Tabs tablet Commonly known as: BRILINTA Take 1 tablet (90 mg total) by mouth 2 (two) times daily. What changed: how much to take   timolol 0.5 % ophthalmic solution Commonly known as: TIMOPTIC Place 1 drop into both eyes every morning.        DISPOSITION AND FOLLOW-UP:  Amy Howell was discharged from [facility] in Good condition. At the hospital follow up visit please address:  Follow-up Recommendations: Labs: Basic Metabolic Profile and CBC Studies: Echo, CXR  Medications: Mirtazapine, GDMT   Follow-up Appointments:  Follow-up Information     Branch, Alben Spittle, MD. Schedule an appointment as soon as possible for a visit today.   Specialty: Cardiology Why: Hospital Follow-up within 2 weeks Contact information: [address] [place] [address] [phone]         Olegario Messier, MD Follow up in 16 day(s).   Specialty: Internal Medicine Why: 10/01/2023 at  03:15pm For a hospital Follow-up Contact information: [address] [place] [address] [phone]         [facility]. Go in 6 day(s).

## 2023-09-18 NOTE — Care Management Important Message (Signed)
Important Message  Patient Details  Name: Amy Howell MRN: [id number] Date of Birth: Jan 16, 1956   Important Message Given:  Yes - Medicare IM     Dorena Bodo 09/18/2023, 2:00 PM

## 2023-09-18 NOTE — Progress Notes (Signed)
   Heart Failure Stewardship Pharmacist Progress Note   PCP: Rana Snare, DO PCP-Cardiologist: Maisie Fus, MD    HPI:  67 yo F with PMH of CHF, HTN, T2DM, brain aneurysm with stent placed, CVA, CAD/PCI, breast cancer s/p R mastectomy.   Admitted 11/2022 with increased shortness of breath in the setting of acute HFrEF. Cath showed 75% stenosis of mid RCA s/p PCI with DES. EF 30-35%. Hospitalization complicated by AKI. She was started on losartan, carvedilol, and spironolactone. Later, the losartan was stopped due to headache.   ECHO 01/2023 with EF 40-45%.   Presented to the ED on 10/11 with shortness of breath and diarrhea. Also complaining of orthopnea and unintentional weight loss. CXR with enlarged heart and vascular congestion with interstitial edema. BNP 2632. CTA negative for PE. ECHO 10/12 showed LVEF 30-35%, moderate-large pericardial effusion (no evidence of tamponade on repeat ECHO 10/13), mild-moderate MR. Refused thoracentesis on 10/13. Code stroke was called on 10/14 with LUE weakness and left facial droop with slurred speech. CT angiography head and neck with concern for narrowing at the intracranial stent in the right MCA. Stat MRI was done that shows a small stroke in the right hemisphere, likely related to the in-stent narrowing. TNK not used due to mild symptoms with rapid improvement. Taken for angiogram and found MCA stent to be patent but she still has progressive right MCA aneurysms needing treatment, recommending outpatient follow up.  Current HF Medications: None  Prior to admission HF Medications: Beta blocker: carvedilol 6.25 mg BID MRA: spironolactone 25 mg daily *also on hydrochlorothiazide 12.5 mg daily  Pertinent Lab Values: Serum creatinine 1.32, BUN 16, Potassium 4.0, Sodium 133, BNP 2632.8, Magnesium 2.3  Vital Signs: Weight: 88 lbs (admission weight: 90 lbs) Blood pressure: 130-150/60-70s  Heart rate: 80-90s  I/O: incomplete  Medication  Assistance / Insurance Benefits Check: Does the patient have prescription insurance?  Yes Type of insurance plan: [employer] Medicare  Outpatient Pharmacy:  Prior to admission outpatient pharmacy: [facility] OP Is the patient willing to use [facility] TOC pharmacy at discharge? Yes    Assessment: 1. Acute on chronic systolic CHF (LVEF 30-35%). NYHA class II symptoms. - Off IV lasix, volume improved. Strict I/Os and daily weights. Keep K>4 and Mg>2.  - History of only taking carvedilol once daily instead of BID. May need to transition to metoprolol XL at discharge (currently holding BB for permissive HTN) - History of angioedema on ACE-I. Intolerant to losartan 2/2 headaches.  - Holding spironolactone and Jardiance with AKI, improved. Consider restarting spironolactone today. - Holding BiDil with hypotensive episode on 10/14 - Consider discontinuing hydrochlorothiazide on discharge  Plan: 1) Medication changes recommended at this time: - Start metoprolol XL 25 mg daily - Restart spironolactone 12.5 mg daily - discontinue hydrochlorothiazide on discharge  2) Patient assistance: - BiDil copay $47 - Jardiance copay $47  3)  Education  - To be completed prior to discharge  Sharen Hones, PharmD, BCPS Heart Failure Engineer, building services Phone [phone]

## 2023-09-18 NOTE — Plan of Care (Signed)
Problem: Education: Goal: Understanding of CV disease, CV risk reduction, and recovery process will improve Outcome: Progressing Goal: Individualized Educational Video(s) Outcome: Progressing   Problem: Activity: Goal: Ability to return to baseline activity level will improve Outcome: Progressing   Problem: Cardiovascular: Goal: Ability to achieve and maintain adequate cardiovascular perfusion will improve Outcome: Progressing Goal: Vascular access site(s) Level 0-1 will be maintained Outcome: Progressing   Problem: Health Behavior/Discharge Planning: Goal: Ability to safely manage health-related needs after discharge will improve Outcome: Progressing   Problem: Education: Goal: Knowledge of General Education information will improve Description: Including pain rating scale, medication(s)/side effects and non-pharmacologic comfort measures Outcome: Progressing   Problem: Health Behavior/Discharge Planning: Goal: Ability to manage health-related needs will improve Outcome: Progressing   Problem: Clinical Measurements: Goal: Ability to maintain clinical measurements within normal limits will improve Outcome: Progressing Goal: Will remain free from infection Outcome: Progressing Goal: Diagnostic test results will improve Outcome: Progressing Goal: Respiratory complications will improve Outcome: Progressing Goal: Cardiovascular complication will be avoided Outcome: Progressing   Problem: Activity: Goal: Risk for activity intolerance will decrease Outcome: Progressing   Problem: Nutrition: Goal: Adequate nutrition will be maintained Outcome: Progressing   Problem: Coping: Goal: Level of anxiety will decrease Outcome: Progressing   Problem: Elimination: Goal: Will not experience complications related to bowel motility Outcome: Progressing Goal: Will not experience complications related to urinary retention Outcome: Progressing   Problem: Pain Managment: Goal:  General experience of comfort will improve Outcome: Progressing   Problem: Safety: Goal: Ability to remain free from injury will improve Outcome: Progressing   Problem: Skin Integrity: Goal: Risk for impaired skin integrity will decrease Outcome: Progressing

## 2023-09-18 NOTE — Discharge Instructions (Addendum)
[name], Howell were admitted because you presented with increasing shortness of breath and diarrhea. You were found to have a Congestive Heart Failure exacerbation. You had fluid in your lungs and around your heart. You were given medication to remove fluid from your body, and started improving. On exam the day after your admission you did not seem to have a lot of volume overload in your body, we suggested you get the fluid around your lungs analyzed to determine what was causing it. An ultrasound around your lungs was done and it showed this fluid to be improving. The risks and benefits of the procedure were discussed with you and you denies the procedure. An ultrasound of your heart showed improvement of the fluid around your heart. We discussed that the nature of this fluid could be due to several things, including infection, worsening heart failure, and also due to your cancer history.   -Please follow up with your Oncologist within 1 weeks of this hospitalization to follow up on your breast cancer.  -Get a repeat Echo in the next 7-10 days of your heart  -Get a repeat chest x ray of your lungs in the next 7-10 days  -Follow up with Dr.Mary Howell or one of her advanced practitioners with the next 2 weeks.   -Follow up with Neurology in about 4 weeks   For your heart failure:  You may continue taking your Coreg. During your hospitalization you stated you DID NOT take: Spironolactone, Farxiga or Hydrochlorothiazide. We are NOT starting these medications for you at this time, but will need follow up with Cardiology.  Please monitor your blood pressures. Should these go persistently over 140s/150s please call us or the cardiology office. If your systolic or the upper number is less than 100, call our clinic or the cardiology clinic.   During your admission you had a Right Sided stroke and quickly recovered. There was concern that your stent in your brain was narrowed but angiography showed that  this was not occluded or narrowed. You will need to take:  Brilinta 90 mg twice daily  Aspirin 81 mg daily  Atorvastatin 40 mg daily  Please stop smoking.  You will need to gain weight to get treatment for your Aneurysms in your brain. We started you on a medication called Mirtazapine. We also consulted a nutritionist who recommended you be on a multivitamin and drink ensure three times daily. Please take Mirtazapine 7.5 mg daily at bedtime. This will help with weight gain and can help with sleep.  Take a daily multivitamin  Drink Ensure three times a day. Should you become nauseous, please talk to your primary care doctor with regards to what medication this would be helpful for you.  For your diarrhea:  You started an antibiotic called Azithromycin that also covered you in case you had a pneumonia. You completed this treatment while you were in the hospital. You improved while hospitalized. Your stool panel was negative. Fecal elastase was negative (meaning you do not have pancreatic insufficiency). Please follow up with your primary care doctor to see if you would need more work-up for this if your diarrhea is more persistent.   For your colonoscopy:  You are okay to discontinue the Brilinta 5 days before your colonoscopy on November 1st.   For your lung nodule:  This was a  finding on imaging at admission. Please make sure you follow up with a non-contrast CT in 12 months after discussing risks and benefits  with your primary care doctor.   Please call the clinic if you have any questions. Please make sure you follow up with Dr. Ralene Howell on the 30th.   Sincerely,  Washington Alexander-Savino,MD  PGY-1

## 2023-09-18 NOTE — Progress Notes (Signed)
PT Cancellation Note  Patient Details Name: Amy Howell MRN: [id number] DOB: 1956-09-12   Cancelled Treatment:    Reason Eval/Treat Not Completed: PT screened, no needs identified, will sign off - pt declines PT, states she got charged for last PT visit in the hospital setting and said she is mobilizing independently at this time. PT to sign off.   Marye Round, PT DPT Acute Rehabilitation Services Secure Chat Preferred  Office [phone]    Truddie Coco 09/18/2023, 10:43 AM

## 2023-09-18 NOTE — Telephone Encounter (Signed)
Currently admitted- will need TOC after d/c

## 2023-09-19 ENCOUNTER — Other Ambulatory Visit (HOSPITAL_COMMUNITY): Payer: Self-pay | Admitting: Interventional Radiology

## 2023-09-19 ENCOUNTER — Encounter: Payer: Self-pay | Admitting: Gastroenterology

## 2023-09-19 DIAGNOSIS — I671 Cerebral aneurysm, nonruptured: Secondary | ICD-10-CM

## 2023-09-19 NOTE — Telephone Encounter (Signed)
  Patient contacted regarding discharge from [facility] on 09/18/23.  Patient understands to follow up with provider good[facility] on 09/30/23 at 9:15 am at [facility]. Patient understands discharge instructions? yes Patient understands medications and regiment? yes Patient understands to bring all medications to this visit? yes  Ask patient:  Are you enrolled in My Chart (yes or no)  If no ask patient if they would like to enroll.

## 2023-09-20 NOTE — Plan of Care (Signed)
CHL Tonsillectomy/Adenoidectomy, Postoperative PEDS care plan entered in error.

## 2023-09-22 ENCOUNTER — Ambulatory Visit (HOSPITAL_COMMUNITY)
Admission: RE | Admit: 2023-09-22 | Discharge: 2023-09-22 | Disposition: A | Discharge status: Home / Self Care | Payer: Medicare (Managed Care) | Source: Ambulatory Visit | Attending: Interventional Radiology | Admitting: Interventional Radiology

## 2023-09-22 ENCOUNTER — Telehealth: Payer: Self-pay

## 2023-09-22 DIAGNOSIS — I671 Cerebral aneurysm, nonruptured: Secondary | ICD-10-CM

## 2023-09-22 DIAGNOSIS — G460 Middle cerebral artery syndrome: Secondary | ICD-10-CM | POA: Diagnosis not present

## 2023-09-22 NOTE — Transitions of Care (Post Inpatient/ED Visit) (Signed)
09/22/2023  Name: Amy Howell MRN: [id number] DOB: [birth year]  Today's TOC FU Call Status: Today's TOC FU Call Status:: Successful TOC FU Call Completed TOC FU Call Complete Date: 09/22/23 Patient's Name and Date of Birth confirmed.  Transition Care Management Follow-up Telephone Call Date of Discharge: 09/18/23 Discharge Facility: [facility]) Type of Discharge: Inpatient Admission Primary Inpatient Discharge Diagnosis:: Acute on Chronic Heart Failure with Reduced Ejection Fraction How have you been since you were released from the hospital?: Better (Per patients daughter she is doing much better) Any questions or concerns?: No  Items Reviewed: Did you receive and understand the discharge instructions provided?: Yes Medications obtained,verified, and reconciled?: Yes (Medications Reviewed) Any new allergies since your discharge?: No Dietary orders reviewed?: Yes Type of Diet Ordered:: Low sodium, heart healthy Do you have support at home?: Yes People in Home: child(ren), adult Name of Support/Comfort Primary Source: Amy Howell  Medications Reviewed Today: Medications Reviewed Today     Reviewed by Amy Gross, RN (Case Manager) on 09/22/23 at 0920  Med List Status: <None>   Medication Order Taking? Sig Documenting Provider Last Dose Status Informant  aspirin 81 MG chewable tablet [id number] No Chew 1 tablet (81 mg total) by mouth daily.  Patient not taking: Reported on 09/12/2023   Alberteen Sam, MD Not Taking Active Self, Child  atorvastatin (LIPITOR) 40 MG tablet [id number] Yes Take 1 tablet (40 mg total) by mouth daily. Corky Crafts, MD Taking Active Self, Child  carvedilol (COREG) 6.25 MG tablet [id number] Yes Take 1 tablet (6.25 mg total) by mouth 2 (two) times daily. Start taking 09/19/23 Gwenevere Abbot, MD Taking Active   dorzolamide (TRUSOPT) 2 % ophthalmic solution [id number] Yes Instill 1 drop into both eyes twice a day  Taking Active Child, Self   feeding supplement (ENSURE ENLIVE / ENSURE PLUS) LIQD [id number] Yes Take 237 mLs by mouth 3 (three) times daily between meals. Gwenevere Abbot, MD Taking Active   latanoprost (XALATAN) 0.005 % ophthalmic solution [id number] Yes Instill 1 drop into both eyes at bedtime  Taking Active Child, Self  mirtazapine (REMERON SOL-TAB) 15 MG disintegrating tablet [id number] Yes Dissolve 1/2 tablet (7.5 mg total) in mouth at bedtime. Gwenevere Abbot, MD Taking Active   Multiple Vitamin (MULTIVITAMIN WITH MINERALS) TABS tablet [id number] Yes Take 1 tablet by mouth daily. Gwenevere Abbot, MD Taking Active   ticagrelor [facility]) 90 MG TABS tablet [id number] Yes Take 1 tablet (90 mg total) by mouth 2 (two) times daily. Gwenevere Abbot, MD Taking Active   timolol (TIMOPTIC) 0.5 % ophthalmic solution [id number] Yes Place 1 drop into both eyes every morning.  Taking Active Self, Child            Home Care and Equipment/Supplies: Were Home Health Services Ordered?: No Any new equipment or medical supplies ordered?: No  Functional Questionnaire: Do you need assistance with bathing/showering or dressing?: Yes Do you need assistance with meal preparation?: Yes Do you need assistance with eating?: No Do you have difficulty maintaining continence: No Do you need assistance with getting out of bed/getting out of a chair/moving?: No Do you have difficulty managing or taking your medications?: Yes  Follow up appointments reviewed: PCP Follow-up appointment confirmed?: Yes Date of PCP follow-up appointment?: 10/01/23 Follow-up Provider: Dr. Cleta Alberts Specialist [facility] Follow-up appointment confirmed?: Yes Date of Specialist follow-up appointment?: 09/30/23 Follow-Up Specialty Provider:: Marjie Skiff Do you need transportation to your follow-up appointment?: No Do you understand care options if your condition(s) worsen?: Yes-patient verbalized understanding  SDOH Interventions Today    Flowsheet Row Most Recent Value   SDOH Interventions   Food Insecurity Interventions Intervention Not Indicated  Housing Interventions Intervention Not Indicated  Transportation Interventions Intervention Not Indicated  Utilities Interventions Intervention Not Indicated     Amy Gross RN, BSN, CCM RN Care Manager  Transitions of Care  [facility] - Population Health  [phone]

## 2023-09-23 ENCOUNTER — Other Ambulatory Visit (HOSPITAL_COMMUNITY): Payer: Self-pay | Admitting: Interventional Radiology

## 2023-09-23 DIAGNOSIS — I671 Cerebral aneurysm, nonruptured: Secondary | ICD-10-CM

## 2023-09-23 NOTE — Progress Notes (Unsigned)
Cardiology Office Note:    Date:  09/30/2023   ID:  Amy Howell, DOB [birth year], MRN [id number]  PCP:  Amy Snare, DO  Cardiologist:  Amy Fus, MD     Referring MD: Amy Snare, DO   Chief Complaint: hospital follow-up of CHF  History of Present Illness:    Amy Howell is a 67 y.o. female with a history of CAD s/p DES to RCA in 11/2022, chronic HFrEF with EF of 30-35% in 09/2023, CVA in 03/2017 s/p loop recorder which showed no evidence of arrhythmia, brain aneurysm s/p stenting in 05/2017, carotid artery stenosis, hypertension, hyperlipidemia, type 2 diabetes mellitus, vertigo, right breast cancer s/p mastectomy in 06/2022 (no radiation or chemo), and tobacco abuse who is followed by Amy Howell and presents today for hospital follow-up of CHF.  Patient suffered a CVA in 03/2017 which was felt to be embolic in nature. TEE was unremarkable with no PFO. She had a loop recorder placed by Dr. Johney Howell at that time which showed no evidence of atrial fibrillation/ flutter. 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 He was admitted in 11/2022 with new onset CHF. Echo showed LVEF of 30-35% with global hypokinesis (worse in the inferior/ inferolateral wall from base to apex), moderate asymmetric LVH of the basal-septal segment, and grade 1 diastolic dysfunction as well as mildly reduced RV function and mild to moderate AI. She underwent a right/ left cardiac catheterization which showed 75% stenosis of mid RCA which was successful treated with DES. 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 She was started on GDMT. Unfortunately, patient has been reluctant to medications due to side effects. She has had angioedema to Lisinopril so is not a candidate for Entresto. She had extreme headaches with Losartan and was not willing to rechallenge  this medication. She also stopped Coreg due to headaches. She later agreed to restart Coreg at a lower dose. Repeat Echo in 01/2023 showed mild improvement in EF to 40-45%. She was last seen in the CHF clinic in 01/2023 and at that time refused labs, refused to start a stain, and refused referral for lung cancer screening (given concerns for weight loss, poor appetite, and ongoing smoking).   She was admitted from 09/12/2023 to 09/18/2023 for acute on chronic CHF after presenting with shortness of breath, diarrhea, and unintentional weight loss. BNP was > 2,000. Chest CTA was negative for PE but showed cardiomegaly with a small pericardial effusion, moderate right and small left pleural effusions, patchy ground-glass opacities in the bilateral lower lobes and right middle lobe worrisome for infection, bilateral lower lobe and right middle lobe peribronchial wall thickening worrisome for bronchitis, moderate emphysema, and a right solid pulmonary nodule within the upper lobe. Echo on 10/12 showed LVEF of 30-35% with global hypokinesis, mildly reduced RV function, mild to moderate MR,  mild to moderate AI, and a moderate to large pericardial effusion primarily over the RV with no findings of tamponade. She was briefly diuresed and titration of GDMT was attempted. Repeat limited Echo on 10/13 showed  a moderate pericardial effusion that had improved from prior imaging.  She declined thoracentesis. 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 Hospitalization was complicated by stroke like symptoms (weakness, dizziness, left facial droop, and left upper extremity weakness) that occurred  after taking Bidil for the first time. A brain MRI showed an acute infarction of the right posterior frontal lobe as well as extensive chronic small vessel ischemic changes elsewhere throughout the brain as well as old bilateral occipital cortical and subcortical infarctions  (right > left). Symptoms improved rapidly and TNK was not administered. Stroke was felt to be due to right MCA stenosis in setting of hypotension. She was started on DAPT with Aspirin 81mg  daily and Brilinta 90mg  twice daily (she had only been taking 45mg  twice daily). She underwent a cerebral angiography which di not show signs of stent narrowing but aneurysms were noted. Aneurysms were not treated as she may need to gain wait. Plan was for outpatient follow-up to discuss additional treatment.   She was seen in [facility] on 09/24/2023 at which time she was doing okay. Her main complaint was ongoing fatigue but she denies any dyspnea, orthopnea, PND, or edema. She was eating more and drinking Ensure shakes and had gained some weight with this. She was only taking her Coreg once a day and was very hesitant to increase her medications.   Patient presents today for follow-up. Here with her daughter. Patient is doing well from a cardiac standpoint. She denies any chest pian, shortness of breath, orthopnea, PND, edema, palpations, lightheadedness, dizziness, or syncope. She is still very hesitant of taking medications. She is taking her DAPT as prescribed but is not taking her Lipitor. She thinks the Lipitor was causing some tingling in her feet. She is prescribed Coreg 6.25mg  twice daily but has only been taking 3.125mg  twice daily. However, her daughter states she has been putting an additional 3.125mg  daily in her Ensure shake around lunch. She did take a full 6.25mg  tablet this morning and is tolerating this well so far. BP remains mildly elevated. Daughter brings in a home BP log which shows BP ranging from 133-177/66-90. Of note, her daughter also told me that patient told her that she had been throwing her Coreg in the trash prior to most recent admission.   Patient was supposed to have a routine colonoscopy next month but this has been postponed due to her recent stroke. She is  stable for this from a cardiac standpoint. She is able to perform activities of daily living including walking around the store and carrying in groceries without chest pain or shortness of breath.   EKGs/Labs/Other Studies Reviewed:    The following studies were reviewed:  Right/ Left Cardiac Catheterization 11/26/2022:   1st Mrg lesion is 50% stenosed.   Mid RCA lesion is 75% stenosed.  A drug-eluting stent was successfully placed using a SYNERGY XD 3.0X20, postdilated to greater than 3.5 mm.   Post intervention, there is a 0% residual stenosis.   There is moderate left ventricular systolic dysfunction.   LV end diastolic pressure is moderately elevated.   The left ventricular ejection fraction is 35-45% by visual estimate.   There is no aortic valve stenosis.   Right ulnar artery looks slightly larger.  There was some tortuosity and a branch vessel which the wire selected initially.  With a JR 4 catheter versa core wire or was was directed more meat daily and went into the radial artery.   Aortic saturation 96%, PA saturation 70%, PA pressure 29/12, mean PA pressure 20 mmHg, mean pulmonary capillary wedge pressure 5 mmHg, mean right atrial pressure 1 mmHg, cardiac output 5.02 L/min, cardiac index 3.58.   Normal right heart pressures.  Successful PCI of the mid RCA.  Will change antiplatelet therapy to aspirin 81 mg daily and clopidogrel 75 mg daily.  Would have her stop the Brilinta 45 mg twice daily dose.  She will need aggressive secondary prevention.  She will need aggressive medical therapy for her LV dysfunction.  Diagnostic Dominance: Right  Intervention    _______________  Complete Echocardiogram 09/13/2023: Impressions: 1. Moderate-large pericardial effusion, primarily over RV, with  epicardial fat. IVC is grossly normal. There is no RV diastolic collapse,  but there may be subtle decreased diastolic expansion of the RV. No  significant respiratory variability of the   septum. No respiratory variability of mitral inflow signal. BP on echo  169/69, HR 100. Finding in combination do not suggest tamponade, however  given size of effusion, would consider close follow up exam within 2-3  days to ensure no change.  Moderate-large effusion.   2. Left ventricular ejection fraction, by estimation, is 30 to 35%. The  left ventricle has moderately decreased function. The left ventricle  demonstrates global hypokinesis. There is mild left ventricular  hypertrophy. Indeterminate diastolic filling due   to E-A fusion.   3. Right ventricular systolic function is mildly reduced. The right  ventricular size is normal. Tricuspid regurgitation signal is inadequate  for assessing PA pressure.   4. Left atrial size was mildly dilated.   5. The mitral valve is grossly normal. Mild to moderate mitral valve  regurgitation. No evidence of mitral stenosis.   6. The aortic valve is tricuspid. Aortic valve regurgitation is mild to  moderate (BP noted 169/69). No aortic stenosis is present. Aortic  regurgitation PHT measures 360 msec.   7. The inferior vena cava is normal in size with <50% respiratory  variability, suggesting right atrial pressure of 8 mmHg.  _______________  Limited Echocardiogram 09/14/2023: Impressions: 1. Left ventricular ejection fraction, by estimation, is 30 to 35%. The  left ventricle has moderately decreased function. There is severe left  ventricular hypertrophy.   2. Right ventricular systolic function is mildly reduced. The right  ventricular size is normal.   3. Moderate pericardial effusion, improved from prior study. . There is  no evidence of cardiac tamponade.   4. Mild to moderate mitral valve regurgitation.   5. The inferior vena cava is normal in size with greater than 50%  respiratory variability, suggesting right atrial pressure of 3 mmHg.    EKG:  EKG not ordered today.   Recent Labs: 11/22/2022: TSH 0.506 09/12/2023: ALT 67; B  Natriuretic Peptide 2,632.8 09/17/2023: Magnesium 2.3 09/18/2023: BUN 16; Creatinine, Ser 1.32; Hemoglobin 11.2; Platelets 193; Potassium 4.0; Sodium 133  Recent Lipid Panel    Component Value Date/Time   CHOL 141 09/16/2023 0451   CHOL 176 06/30/2023 0805   TRIG 90 09/16/2023 0451   HDL 32 (L) 09/16/2023 0451   HDL 50 06/30/2023 0805   CHOLHDL 4.4 09/16/2023 0451   VLDL 18 09/16/2023 0451   LDLCALC 91 09/16/2023 0451   LDLCALC 110 (H) 06/30/2023 0805    Physical Exam:    Vital Signs: BP (!) 144/66   Pulse 80   Ht 5' (1.524 m)   Wt 87 lb 12.8 oz (39.8 kg)   SpO2 98%   BMI 17.15 kg/m     Wt Readings from Last 3 Encounters:  09/30/23 87 lb 12.8 oz (39.8 kg)  09/24/23 88 lb 12.8 oz (40.3 kg)  09/18/23 88 lb 13.5 oz (40.3 kg)     General: 67 y.o.  thin African-American female in no acute distress. HEENT: Normocephalic and atraumatic.  Neck: Supple. No carotid bruits. No JVD. Heart: RRR. Distinct S1 and S2. No murmurs, gallops, or rubs.  Lungs: No increased work of breathing. Clear to ausculation bilaterally. No wheezes, rhonchi, or rales.  Extremities: No lower extremity edema. Skin: Warm and dry. Neuro: No focal deficits. Psych: Normal affect. Responds appropriately.  Assessment:    1. Coronary artery disease involving native coronary artery of native heart without angina pectoris   2. Chronic HFrEF (heart failure with reduced ejection fraction) (HCC)   3. Pericardial effusion   4. Primary hypertension   5. Hyperlipidemia, unspecified hyperlipidemia type   6. Type 2 diabetes mellitus with complication, without long-term current use of insulin (HCC)   7. Stage 3 chronic kidney disease, unspecified whether stage 3a or 3b CKD (HCC)   8. Cerebrovascular accident (CVA), unspecified mechanism (HCC)   9. Cerebral aneurysm   10. Pre-op evaluation     Plan:    CAD S/p DES to mid RCA in 11/2022.  - No chest pain.  - On DAPT with Aspirin 81mg  daily and Brilinta 90mg   twice daily after recent stroke.  - Continue high-intensity statin.  Chronic HFrEF Initially diagnosed in 11/2022 when found to have an EF of 30-35%  Cardiomyopathy was felt to be of mixed etiology from CAD and hypertension. GDMT has been attempted but she has had side effects to multiple medications and has been hesitant to try others. Most recent Echo earlier this month showed LVEF 30-35% with global hypokinesis, mildly reduced RV function, mild to moderate MR, and mild to moderate AI.  - Euvolemic on exam.  - She is prescribed Coreg 6.25mg  twice daily but has only been taking 3.125mg  twice daily (although daughter has been giving her an additional 3.125mg  around lunch). She did take a full 6.25mg  tablet this morning and has tolerated it well so far. She is agreeable to try taking 6.25mg  in the morning and 3.125mg  in the evening. - She declines any other GDMT.  - Discussed importance of daily weight and sodium/ fluid restrictions.   Pericardial Effusion Echo during recent admission earlier this month showed a moderate to large pericardial effusion primarily over the RV but no findings of tamponade. Repeat limited Echo on 10/15/2023 showed a moderate pericardial effusion that had improved from prior imaging.  - No clinical signs of tamponade.  - Repeat Echo is already scheduled for 10/08/2023.   Hypertension Patient has a history of hypertension that has been difficult to manage due to non-compliance and medication intolerances.  - BP mildly elevated. Initially 152/76 and then 144/66 on my personal recheck at the end of visit.  - Continue Coreg as above. - She continues to declined any other GDMT or antihypertensives.   Hyperlipidemia Lipid panel on 09/16/2023: Total Cholesterol 141, Triglycerides 90, HDL 32, LDL 91. LDL goal <70 given CAD and CVA.  - She is prescribed Lipitor 40mg  daily but has not been taking this. She is willing to try 20mg  daily.  - Can recheck lipid panel/ LFTS at  follow-up visit if she reports compliance with statin.   Type 2 Diabetes Mellitus - Not currently on any medications.  - Management per PCP.  CKD Stage III Baseline creatinine around 1.3. Stable at 1.32 on last labs on 09/18/2023.   History of CVA Cerebral Aneurysm History of CVA in 2018 s/p loop recorder with no evidence of arrhythmias. However, stroke work-up revealed a cerebral aneurysm and he underwent  embolization of right MCA aneurysm with stent in 05/2017. He suffered another stroke during recent admission earlier this month. Cerebral angiogram showed MCA stent was patent but she was found to have progressive right MCA aneurysms. Unfortunately, patient needs to gain weight prior to further intervention.  - Continue DAPT with Aspirin and Brilinta.  - Continue statin.  - Follow-up with DR. Deveshwar as recommended.   Pre-Op Evaluation Patient was initially scheduled for a colonscopy next month but states this has been rescheduled for February given recent stroke. She is stable from a cardiac standpoint with no angina, acute CHF symptoms, palpitations, or syncope. She is able to complete >4.0 METS. Based on ACC/AHA guidelines, patient would be at acceptable risk for the planned procedure without further cardiovascular testing. However, will defer to Neurology on when she is stable for this from a Neurologic standpoint given recent CVA and known cerebral aneurysm.    Disposition: Follow up in 3 months.   Signed, Corrin Parker, PA-C  09/30/2023 12:48 PM    [facility]

## 2023-09-24 ENCOUNTER — Encounter (HOSPITAL_COMMUNITY): Payer: Self-pay

## 2023-09-24 ENCOUNTER — Ambulatory Visit (HOSPITAL_COMMUNITY)
Admit: 2023-09-24 | Discharge: 2023-09-24 | Disposition: A | Discharge status: Home / Self Care | Payer: Medicare (Managed Care) | Source: Ambulatory Visit | Attending: Physician Assistant

## 2023-09-24 VITALS — BP 141/65 | HR 85 | Wt 88.8 lb

## 2023-09-24 DIAGNOSIS — R9431 Abnormal electrocardiogram [ECG] [EKG]: Secondary | ICD-10-CM | POA: Insufficient documentation

## 2023-09-24 DIAGNOSIS — I5022 Chronic systolic (congestive) heart failure: Secondary | ICD-10-CM | POA: Diagnosis not present

## 2023-09-24 DIAGNOSIS — Z9011 Acquired absence of right breast and nipple: Secondary | ICD-10-CM | POA: Diagnosis not present

## 2023-09-24 DIAGNOSIS — I5020 Unspecified systolic (congestive) heart failure: Secondary | ICD-10-CM | POA: Diagnosis present

## 2023-09-24 DIAGNOSIS — Z7902 Long term (current) use of antithrombotics/antiplatelets: Secondary | ICD-10-CM | POA: Diagnosis not present

## 2023-09-24 DIAGNOSIS — I6529 Occlusion and stenosis of unspecified carotid artery: Secondary | ICD-10-CM | POA: Insufficient documentation

## 2023-09-24 DIAGNOSIS — I2510 Atherosclerotic heart disease of native coronary artery without angina pectoris: Secondary | ICD-10-CM | POA: Diagnosis not present

## 2023-09-24 DIAGNOSIS — Z87891 Personal history of nicotine dependence: Secondary | ICD-10-CM | POA: Insufficient documentation

## 2023-09-24 DIAGNOSIS — Z91148 Patient's other noncompliance with medication regimen for other reason: Secondary | ICD-10-CM | POA: Insufficient documentation

## 2023-09-24 DIAGNOSIS — Z8673 Personal history of transient ischemic attack (TIA), and cerebral infarction without residual deficits: Secondary | ICD-10-CM | POA: Insufficient documentation

## 2023-09-24 DIAGNOSIS — E1122 Type 2 diabetes mellitus with diabetic chronic kidney disease: Secondary | ICD-10-CM | POA: Insufficient documentation

## 2023-09-24 DIAGNOSIS — I10 Essential (primary) hypertension: Secondary | ICD-10-CM | POA: Diagnosis not present

## 2023-09-24 DIAGNOSIS — Z95828 Presence of other vascular implants and grafts: Secondary | ICD-10-CM | POA: Insufficient documentation

## 2023-09-24 DIAGNOSIS — I130 Hypertensive heart and chronic kidney disease with heart failure and stage 1 through stage 4 chronic kidney disease, or unspecified chronic kidney disease: Secondary | ICD-10-CM | POA: Diagnosis not present

## 2023-09-24 DIAGNOSIS — Z853 Personal history of malignant neoplasm of breast: Secondary | ICD-10-CM | POA: Insufficient documentation

## 2023-09-24 DIAGNOSIS — T50906A Underdosing of unspecified drugs, medicaments and biological substances, initial encounter: Secondary | ICD-10-CM | POA: Diagnosis not present

## 2023-09-24 DIAGNOSIS — N1832 Chronic kidney disease, stage 3b: Secondary | ICD-10-CM | POA: Diagnosis not present

## 2023-09-24 DIAGNOSIS — I428 Other cardiomyopathies: Secondary | ICD-10-CM | POA: Diagnosis not present

## 2023-09-24 DIAGNOSIS — I3139 Other pericardial effusion (noninflammatory): Secondary | ICD-10-CM | POA: Insufficient documentation

## 2023-09-24 NOTE — Patient Instructions (Signed)
No Labs done today.   No medication changes were made. Please continue all current medications as prescribed.  Thank you for allowing us to provide your heart failure care after your recent hospitalization. Please follow-up with General Cardiology.     

## 2023-09-24 NOTE — Progress Notes (Addendum)
HEART & VASCULAR TRANSITION OF CARE CONSULT NOTE     Referring Physician: Dr. Hassan Rowan Primary Care: Dr. Sherrilee Gilles Primary Cardiologist: Dr. Eldridge Dace (establishing with Dr. Wyline Mood)  HPI: Referred to clinic by Dr. Hassan Rowan with [facility] for heart failure consultation. 67 y.o. female with history of HFrEF w/ mixed cardiomyopathy, CAD, DM II, HTN, carotid artery stenosis, hx CVA, cerebral aneurysm s/p stent placement, right breast cancer s/p mastectomy 07/23 (had been on anastrozole but lost to follow-up), tobacco use, hx angioedema with lisinopril.  Had embolic CVA in 2018. Had ILR placed. During workup found to have cerebral aneurysm and underwent embolization of right MCA aneurysm in 05/2017.   Admitted 12/23 with acute respiratory failure, hypertensive urgency and volume overload. Echo with EF 30-35%. LHC with 75% m RCA treated with DES. Cardiomyopathy felt to be 2/2 combination of HTN and CAD.   She was seen in [facility] clinic for several visits. Declined lab draws and medication changes. History of noncompliance noted.  Presented earlier this month with acute on chronic CHF, hypertensive urgency and unintentional weight loss. BNP > 2000. Echo with EF 30-35%, moderate to large pericardial effusion with no evidence of tamponade. Also had bilateral pleural effusions. Cardiology consulted. She was diuresed and attempted to titrate GDMT. She declined thoracentesis. There was some concern for malignant pleural effusions d/t hx breast cancer and recent weight loss.  After receiving BP meds she developed weakness, dizziness and left facial droop and left upper extremity weakness. MRI with small right CR infarct d/t right MCA aneurysms in setting of hypotension. She was seen by Neurology. Had not been adherent with DAPT. On cerebral angiogram found to have progressive right MCA aneurysms needing treatment. It was recommended she gain weight prior to further intervention.  Here today for  hospital follow-up. She is accompanied by her daughter who assists with the history. No dyspnea, orthopnea, PND or lower extremity edema. Has been eating a lot more and drinking ensure shakes to try to gain weight. Home weight up from 83 to 87 lb since discharge. Main concern is ongoing fatigue. She is taking 1/2 tablet of Coreg once a day. She is sensitive to medications and is very hesitant to increase the dose or trial any other GDMT.   Past Medical History:  Diagnosis Date   Cataracts, bilateral    Cerebral infarction due to carotid artery stenosis (HCC)    Diabetes mellitus type 2 in nonobese (HCC)    Glaucoma    Hypertension    Hypertensive emergency 11/22/2022   Stroke [facility])    no residual, "series of mini strokes"   Tobacco abuse    Vertigo     Current Outpatient Medications  Medication Sig Dispense Refill   aspirin 81 MG chewable tablet Chew 1 tablet (81 mg total) by mouth daily. 30 tablet 3   atorvastatin (LIPITOR) 40 MG tablet Take 1 tablet (40 mg total) by mouth daily. 90 tablet 3   carvedilol (COREG) 6.25 MG tablet Take 1 tablet (6.25 mg total) by mouth 2 (two) times daily. Start taking 09/19/23 (Patient taking differently: Take 6.25 mg by mouth 2 (two) times daily. Take 3.125mg  QD) 60 tablet 0   dorzolamide (TRUSOPT) 2 % ophthalmic solution Instill 1 drop into both eyes twice a day 30 mL 3   feeding supplement (ENSURE ENLIVE / ENSURE PLUS) LIQD Take 237 mLs by mouth 3 (three) times daily between meals. 237 mL 12   latanoprost (XALATAN) 0.005 % ophthalmic solution Instill 1 drop into both  eyes at bedtime 22.5 mL 3   mirtazapine (REMERON SOL-TAB) 15 MG disintegrating tablet Dissolve 1/2 tablet (7.5 mg total) in mouth at bedtime. 15 tablet 0   Multiple Vitamin (MULTIVITAMIN WITH MINERALS) TABS tablet Take 1 tablet by mouth daily. 30 tablet 0   ticagrelor (BRILINTA) 90 MG TABS tablet Take 1 tablet (90 mg total) by mouth 2 (two) times daily. 60 tablet 0   timolol (TIMOPTIC) 0.5 %  ophthalmic solution Place 1 drop into both eyes every morning. 45 mL 3   No current facility-administered medications for this encounter.    Allergies  Allergen Reactions   Bidil [Isosorb Dinitrate-Hydralazine] Other (See Comments)    Caused a stroke   Lisinopril Other (See Comments)    Angioedema   Pork-Derived Products Hives   Rosuvastatin Other (See Comments)    headache      Social History   Socioeconomic History   Marital status: Single    Spouse name: Not on file   Number of children: 2   Years of education: Not on file   Highest education level: 10th grade  Occupational History   Occupation: retired  Tobacco Use   Smoking status: Former    Current packs/day: 0.50    Average packs/day: 0.5 packs/day for 56.0 years (28.0 ttl pk-yrs)    Types: Cigarettes   Smokeless tobacco: Never  Vaping Use   Vaping status: Never Used  Substance and Sexual Activity   Alcohol use: No   Drug use: No   Sexual activity: Not Currently    Birth control/protection: Surgical    Comment: Hyst  Other Topics Concern   Not on file  Social History Narrative   Not on file   Social Determinants of Health   Financial Resource Strain: Low Risk  (09/17/2023)   Overall Financial Resource Strain (CARDIA)    Difficulty of Paying Living Expenses: Not hard at all  Food Insecurity: No Food Insecurity (09/22/2023)   Hunger Vital Sign    Worried About Running Out of Food in the Last Year: Never true    Ran Out of Food in the Last Year: Never true  Transportation Needs: No Transportation Needs (09/22/2023)   PRAPARE - Administrator, Civil Service (Medical): No    Lack of Transportation (Non-Medical): No  Physical Activity: Insufficiently Active (04/08/2023)   Exercise Vital Sign    Days of Exercise per Week: 3 days    Minutes of Exercise per Session: 30 min  Stress: No Stress Concern Present (04/08/2023)   [employer] of Occupational Health - Occupational Stress Questionnaire     Feeling of Stress : Not at all  Social Connections: Socially Isolated (04/08/2023)   Social Connection and Isolation Panel [NHANES]    Frequency of Communication with Friends and Family: Three times a week    Frequency of Social Gatherings with Friends and Family: Twice a week    Attends Religious Services: Never    Database administrator or Organizations: No    Attends Banker Meetings: Never    Marital Status: Never married  Intimate Partner Violence: Not At Risk (09/13/2023)   Humiliation, Afraid, Rape, and Kick questionnaire    Fear of Current or Ex-Partner: No    Emotionally Abused: No    Physically Abused: No    Sexually Abused: No      Family History  Problem Relation Age of Onset   Dementia Mother    Kidney disease Father    Hypertension Father  Diabetes Father    Heart disease Father    Lung cancer Maternal Aunt        dx after 53   Prostate cancer Other        MGM's brother   Breast cancer Neg Hx    Colon cancer Neg Hx    Liver disease Neg Hx    Esophageal cancer Neg Hx     Vitals:   09/24/23 1333  BP: (!) 141/65  Pulse: 85  SpO2: 99%  Weight: 40.3 kg (88 lb 12.8 oz)    PHYSICAL EXAM: General:  Thin elderly female HEENT: normal Neck: supple. no JVD.  Cor: PMI nondisplaced. Regular rate & rhythm. No rubs, gallops or murmurs. Lungs: diminished in bases Abdomen: soft, nontender, nondistended.  Extremities: no cyanosis, clubbing, rash, edema Neuro: alert & oriented x 3. Affect pleasant.  ECG: SR 87 bpm, LVH   ASSESSMENT & PLAN: 1. Chronic HFrEF -Echo 12/23: EF 30-35%, moderate asymmetric LVH, RV mildly reduced -R/LHC 12/23: 75% m RCA treated with PCI/DES, 50% OM1, CI 3.58 -Echo 03/24: EF 40-45%, RV okay -Echo 10/24: EF 30-35%, RV mildly reduced, mild to moderate MR, mild to moderate AI -Limited echo 09/14/23: EF 30-35%, severe LVH -Suspect mixed etiology -> HTN and CAD .  -NYHA III. Volume looks stable today. Not currently on loop  diuretic. Has gained weight since discharge but suspect this is due to caloric intake. Discussed signs/symptoms of volume overload to watch for -Taking 3.125 mg Coreg once a day.  -Her blood pressure is above goal.  -She may consider increasing coreg to twice a day but does not want to trial any other GDMT. Did explain that her HTN is contributing to CHF. -Not a candidate for advanced therapies given poor compliance.   2. CAD - S/P PCI to RCA in 2023.  - Taking aspirin and statin - No angina   3. HTN  - not controlled, difficult to manage d/t noncompliance and medication intolerances - GDMT per above    4. H/o Breast Cancer, R -S/P mastectomy  -She did not receive radiation of chemo.  -Had been on anastrazole but lost to follow-up with Oncology   5. Wt Loss - Appetite improving. Now on remeron.  - ? Malignancy. Had recent pericardial effusion and pleural effusions, declined thoracentesis  6. Hx CVAs Cerebral aneurysms -CVA in 2018. Had ILR placed. During workup found to have cerebral aneurysm and underwent embolization of right MCA aneurysm in 05/2017.  -CVA during admit 10/24. MRI with small right CR infarct d/t right MCA aneurysms in setting of hypotension. On cerebral angiogram MCA stent was patent but found to have progressive right MCA aneurysms needing treatment. Needs to gain weight prior to further intervention. -Continue brilinta and aspirin -Following with Dr. Corliss Skains  7. Pericardial effusion -Moderate to large on echo 09/13/23 - no evidence of tamponade -Moderate in size on limited echo 09/14/23 -Repeat echo scheduled 11/06  8. CKD IIIb -Last Scr 1.3 on 10/17 -Needs better control of HTN  Referred to HFSW (PCP, Medications, Transportation, ETOH Abuse, Drug Abuse, Insurance, Financial ): No Refer to Pharmacy: No Refer to Home Health: Yes on No Refer to Advanced Heart Failure Clinic: No Refer to General Cardiology: No, already established  Follow up  PRN, keep  follow-up with Cardiology on 10/29

## 2023-09-29 ENCOUNTER — Telehealth: Payer: Self-pay | Admitting: *Deleted

## 2023-09-29 HISTORY — PX: IR RADIOLOGIST EVAL & MGMT: IMG5224

## 2023-09-29 NOTE — Telephone Encounter (Signed)
Dr. Barron Alvine,  This pt's cardiac EF is below the [facility] standard.  Her procedure will need to be done at the hospital.  Thanks,  Cathlyn Parsons

## 2023-09-29 NOTE — Telephone Encounter (Signed)
Patient also with R MCA stroke 09/15/23; on Brilinta. Will she need to to wait until Jan/Feb to have procedure done? Follow up office visit first?

## 2023-09-29 NOTE — Telephone Encounter (Signed)
===  View-only below this line=== ----- Message ----- From: Shellia Cleverly, DO Sent: 09/29/2023  12:43 PM EDT To: Richardson Chiquito, RN  Yes, as this is being done for colon cancer screening/polyp surveillance, need to postpone procedure.  Plan for follow-up in the office in 3 months or so and can discuss appropriate timing of if/when to schedule colonoscopy.

## 2023-09-29 NOTE — Telephone Encounter (Signed)
Patient has been scheduled for office visit with Dr Barron Alvine on 01/06/24 at  10:40 am for re discussion of colonoscopy.

## 2023-09-30 ENCOUNTER — Ambulatory Visit: Payer: Medicare (Managed Care) | Attending: Student | Admitting: Student

## 2023-09-30 ENCOUNTER — Encounter: Payer: Self-pay | Admitting: Student

## 2023-09-30 ENCOUNTER — Other Ambulatory Visit (HOSPITAL_COMMUNITY): Payer: Self-pay

## 2023-09-30 VITALS — BP 144/66 | HR 80 | Ht 60.0 in | Wt 87.8 lb

## 2023-09-30 DIAGNOSIS — I10 Essential (primary) hypertension: Secondary | ICD-10-CM | POA: Diagnosis not present

## 2023-09-30 DIAGNOSIS — I2510 Atherosclerotic heart disease of native coronary artery without angina pectoris: Secondary | ICD-10-CM | POA: Diagnosis not present

## 2023-09-30 DIAGNOSIS — E118 Type 2 diabetes mellitus with unspecified complications: Secondary | ICD-10-CM

## 2023-09-30 DIAGNOSIS — I5022 Chronic systolic (congestive) heart failure: Secondary | ICD-10-CM | POA: Diagnosis not present

## 2023-09-30 DIAGNOSIS — N183 Chronic kidney disease, stage 3 unspecified: Secondary | ICD-10-CM | POA: Diagnosis not present

## 2023-09-30 DIAGNOSIS — I3139 Other pericardial effusion (noninflammatory): Secondary | ICD-10-CM | POA: Diagnosis not present

## 2023-09-30 DIAGNOSIS — I671 Cerebral aneurysm, nonruptured: Secondary | ICD-10-CM

## 2023-09-30 DIAGNOSIS — Z01818 Encounter for other preprocedural examination: Secondary | ICD-10-CM | POA: Diagnosis not present

## 2023-09-30 DIAGNOSIS — I639 Cerebral infarction, unspecified: Secondary | ICD-10-CM | POA: Diagnosis not present

## 2023-09-30 DIAGNOSIS — E785 Hyperlipidemia, unspecified: Secondary | ICD-10-CM | POA: Diagnosis not present

## 2023-09-30 MED ORDER — ATORVASTATIN CALCIUM 20 MG PO TABS
20.0000 mg | ORAL_TABLET | Freq: Every day | ORAL | 3 refills | Status: DC
  Filled 2023-09-30 – 2023-10-02 (×2): qty 90, 90d supply, fill #0

## 2023-09-30 NOTE — Patient Instructions (Signed)
Medication Instructions:  Decrease Lipitor to 20 mg ( Take 1 Tablet Daily). Coreg 6.25 mg ( Take 6.25 mg In the Morning , Take 3.125 mg In The Evening). *If you need a refill on your cardiac medications before your next appointment, please call your pharmacy*   Lab Work: No labs If you have labs (blood work) drawn today and your tests are completely normal, you will receive your results only by: MyChart Message (if you have MyChart) OR A paper copy in the mail If you have any lab test that is abnormal or we need to change your treatment, we will call you to review the results.   Testing/Procedures: No Testing   Follow-Up: At [facility], you and your health needs are our priority.  As part of our continuing mission to provide you with exceptional heart care, we have created designated Provider Care Teams.  These Care Teams include your primary Cardiologist (physician) and Advanced Practice Providers (APPs -  Physician Assistants and Nurse Practitioners) who all work together to provide you with the care you need, when you need it.  We recommend signing up for the patient portal called "MyChart".  Sign up information is provided on this After Visit Summary.  MyChart is used to connect with patients for Virtual Visits (Telemedicine).  Patients are able to view lab/test results, encounter notes, upcoming appointments, etc.  Non-urgent messages can be sent to your provider as well.   To learn more about what you can do with MyChart, go to [email or web address].    Your next appointment:   3 month(s)  Provider:    Marjie Skiff, PA-C

## 2023-10-01 ENCOUNTER — Other Ambulatory Visit (HOSPITAL_COMMUNITY): Payer: Self-pay

## 2023-10-01 ENCOUNTER — Ambulatory Visit: Payer: Medicare (Managed Care) | Admitting: Student

## 2023-10-01 ENCOUNTER — Encounter: Payer: Self-pay | Admitting: Student

## 2023-10-01 VITALS — BP 179/85 | HR 84 | Temp 98.5°F | Ht 60.0 in | Wt 89.7 lb

## 2023-10-01 DIAGNOSIS — I110 Hypertensive heart disease with heart failure: Secondary | ICD-10-CM

## 2023-10-01 DIAGNOSIS — Z8673 Personal history of transient ischemic attack (TIA), and cerebral infarction without residual deficits: Secondary | ICD-10-CM

## 2023-10-01 DIAGNOSIS — E43 Unspecified severe protein-calorie malnutrition: Secondary | ICD-10-CM

## 2023-10-01 DIAGNOSIS — I5023 Acute on chronic systolic (congestive) heart failure: Secondary | ICD-10-CM

## 2023-10-01 DIAGNOSIS — I639 Cerebral infarction, unspecified: Secondary | ICD-10-CM

## 2023-10-01 DIAGNOSIS — I159 Secondary hypertension, unspecified: Secondary | ICD-10-CM

## 2023-10-01 DIAGNOSIS — Z87891 Personal history of nicotine dependence: Secondary | ICD-10-CM | POA: Diagnosis not present

## 2023-10-01 MED ORDER — SPIRONOLACTONE 25 MG PO TABS
12.5000 mg | ORAL_TABLET | Freq: Every day | ORAL | 3 refills | Status: DC
  Filled 2023-10-01: qty 45, 90d supply, fill #0

## 2023-10-01 MED ORDER — MIRTAZAPINE 15 MG PO TBDP
7.5000 mg | ORAL_TABLET | Freq: Every day | ORAL | 7 refills | Status: DC
  Filled 2023-10-01 – 2023-10-03 (×2): qty 45, 90d supply, fill #0
  Filled 2023-11-03: qty 30, 60d supply, fill #0
  Filled 2023-11-03: qty 45, 90d supply, fill #0
  Filled 2023-11-04: qty 15, 30d supply, fill #0
  Filled 2023-11-04: qty 45, 90d supply, fill #0

## 2023-10-01 NOTE — Patient Instructions (Addendum)
Thank you so much for coming to the clinic today!   For your blood pressure, please start taking 1/2 of the medication called spironolactone.  Please continue checking your blood pressure and also your weight at home, we would like to see you back in about a month so we can follow-up on all everything.  I have also sent in a refill of your mirtazapine.  If you have any questions please feel free to the call the clinic at anytime at [phone]. It was a pleasure seeing you!  Best, Dr. Thomasene Ripple

## 2023-10-02 ENCOUNTER — Telehealth: Payer: Self-pay

## 2023-10-02 ENCOUNTER — Other Ambulatory Visit (HOSPITAL_COMMUNITY): Payer: Self-pay

## 2023-10-02 NOTE — Assessment & Plan Note (Signed)
Blood pressure chronically elevated as patient has not been taking her hydrochlorothiazide or spironolactone.  I discussed with her the risks of long-term uncontrolled hypertension especially the complications that she is already had including stroke, and significantly reduced ejection fraction, however patient does not like large pill burden.  Discussed with her about taking half a pill of spironolactone and she is agreeable.  At next visit, would recommend discussing adding on hydrochlorothiazide for blood pressure.

## 2023-10-02 NOTE — Assessment & Plan Note (Signed)
Hospital course was complicated by right MCA stroke.  She is doing much better, and on my exam she does not have any deficits in her right arm or any facial droop on the right side.  She is currently taking  Brilinta 90 mg twice a day, and atorvastatin 40 mg.  She follows with neurology as well.

## 2023-10-02 NOTE — Assessment & Plan Note (Signed)
Most recent echo while patient was hospitalized showed an ejection fraction of 30 to 35% with global hypokinesis of the left ventricle.  She was discharged with spironolactone 25 mg, Coreg 6.25 mg, and Farxiga 10 mg.  Of which, she is only taking her Coreg 6.25 mg in the morning, and half a pill at night.  I discussed the importance of all of these and and she seems to understand however states that she does not like taking pills and some of them make her stomach upset and she is not sure which she just stopped.  Had extensive discussion with the patient about the severity of her illnesses and how important it is to be on these medications.  Eventually, was able to get patient agreeable to taking half of her spironolactone pill and starting there.  At her next follow-up I would recommend asking her how she has been doing with it and if she is amenable to taking the full dose.  Plan: - Coreg 6.25 mg in the morning, half a pill at night - Half a pill of spironolactone

## 2023-10-02 NOTE — Progress Notes (Signed)
CC: Hospital follow-up  HPI:  Amy Howell is a 67 y.o. female living with a history stated below and presents today for hospital follow-up. Please see problem based assessment and plan for additional details.  Past Medical History:  Diagnosis Date   Cataracts, bilateral    Cerebral infarction due to carotid artery stenosis (HCC)    Diabetes mellitus type 2 in nonobese (HCC)    Glaucoma    Hypertension    Hypertensive emergency 11/22/2022   Stroke [facility])    no residual, "series of mini strokes"   Tobacco abuse    Vertigo     Current Outpatient Medications on File Prior to Visit  Medication Sig Dispense Refill   aspirin 81 MG chewable tablet Chew 1 tablet (81 mg total) by mouth daily. 30 tablet 3   atorvastatin (LIPITOR) 20 MG tablet Take 1 tablet (20 mg total) by mouth daily. 90 tablet 3   carvedilol (COREG) 6.25 MG tablet Take 1 tablet (6.25 mg total) by mouth 2 (two) times daily. 60 tablet 0   dorzolamide (TRUSOPT) 2 % ophthalmic solution Instill 1 drop into both eyes twice a day 30 mL 3   feeding supplement (ENSURE ENLIVE / ENSURE PLUS) LIQD Take 237 mLs by mouth 3 (three) times daily between meals. 237 mL 12   latanoprost (XALATAN) 0.005 % ophthalmic solution Instill 1 drop into both eyes at bedtime 22.5 mL 3   Multiple Vitamin (MULTIVITAMIN WITH MINERALS) TABS tablet Take 1 tablet by mouth daily. 30 tablet 0   ticagrelor (BRILINTA) 90 MG TABS tablet Take 1 tablet (90 mg total) by mouth 2 (two) times daily. 60 tablet 0   timolol (TIMOPTIC) 0.5 % ophthalmic solution Place 1 drop into both eyes every morning. 45 mL 3   No current facility-administered medications on file prior to visit.    Family History  Problem Relation Age of Onset   Dementia Mother    Kidney disease Father    Hypertension Father    Diabetes Father    Heart disease Father    Lung cancer Maternal Aunt        dx after 67   Prostate cancer Other        MGM's brother   Breast cancer Neg Hx     Colon cancer Neg Hx    Liver disease Neg Hx    Esophageal cancer Neg Hx     Social History   Socioeconomic History   Marital status: Single    Spouse name: Not on file   Number of children: 2   Years of education: Not on file   Highest education level: 10th grade  Occupational History   Occupation: retired  Tobacco Use   Smoking status: Former    Current packs/day: 0.50    Average packs/day: 0.5 packs/day for 56.0 years (28.0 ttl pk-yrs)    Types: Cigarettes   Smokeless tobacco: Never  Vaping Use   Vaping status: Never Used  Substance and Sexual Activity   Alcohol use: No   Drug use: No   Sexual activity: Not Currently    Birth control/protection: Surgical    Comment: Hyst  Other Topics Concern   Not on file  Social History Narrative   Not on file   Social Determinants of Health   Financial Resource Strain: Low Risk  (09/17/2023)   Overall Financial Resource Strain (CARDIA)    Difficulty of Paying Living Expenses: Not hard at all  Food Insecurity: No Food Insecurity (09/22/2023)  Hunger Vital Sign    Worried About Running Out of Food in the Last Year: Never true    Ran Out of Food in the Last Year: Never true  Transportation Needs: No Transportation Needs (09/22/2023)   PRAPARE - Administrator, Civil Service (Medical): No    Lack of Transportation (Non-Medical): No  Physical Activity: Insufficiently Active (04/08/2023)   Exercise Vital Sign    Days of Exercise per Week: 3 days    Minutes of Exercise per Session: 30 min  Stress: No Stress Concern Present (04/08/2023)   [employer] of Occupational Health - Occupational Stress Questionnaire    Feeling of Stress : Not at all  Social Connections: Socially Isolated (04/08/2023)   Social Connection and Isolation Panel [NHANES]    Frequency of Communication with Friends and Family: Three times a week    Frequency of Social Gatherings with Friends and Family: Twice a week    Attends Religious Services:  Never    Database administrator or Organizations: No    Attends Banker Meetings: Never    Marital Status: Never married  Intimate Partner Violence: Not At Risk (09/13/2023)   Humiliation, Afraid, Rape, and Kick questionnaire    Fear of Current or Ex-Partner: No    Emotionally Abused: No    Physically Abused: No    Sexually Abused: No    Review of Systems: ROS negative except for what is noted on the assessment and plan.  Vitals:   10/01/23 1537 10/01/23 1623  BP: (!) 173/71 (!) 179/85  Pulse: 88 84  Temp: 98.5 F (36.9 C)   TempSrc: Oral   SpO2: 100%   Weight: 89 lb 11.2 oz (40.7 kg)   Height: 5' (1.524 m)     Physical Exam: Constitutional: Cachectic, underweight appearing female in no acute distress Cardiovascular: regular rate and rhythm, no m/r/g Pulmonary/Chest: normal work of breathing on room air, lungs clear to auscultation bilaterally Abdominal: soft, non-tender, non-distended Neurological: alert & oriented x 3, 5/5 strength in bilateral upper and lower extremities, normal gait   Assessment & Plan:   Acute on chronic HFrEF (heart failure with reduced ejection fraction) (HCC) Most recent echo while patient was hospitalized showed an ejection fraction of 30 to 35% with global hypokinesis of the left ventricle.  She was discharged with spironolactone 25 mg, Coreg 6.25 mg, and Farxiga 10 mg.  Of which, she is only taking her Coreg 6.25 mg in the morning, and half a pill at night.  I discussed the importance of all of these and and she seems to understand however states that she does not like taking pills and some of them make her stomach upset and she is not sure which she just stopped.  Had extensive discussion with the patient about the severity of her illnesses and how important it is to be on these medications.  Eventually, was able to get patient agreeable to taking half of her spironolactone pill and starting there.  At her next follow-up I would  recommend asking her how she has been doing with it and if she is amenable to taking the full dose.  Plan: - Coreg 6.25 mg in the morning, half a pill at night - Half a pill of spironolactone  Hypertension Blood pressure chronically elevated as patient has not been taking her hydrochlorothiazide or spironolactone.  I discussed with her the risks of long-term uncontrolled hypertension especially the complications that she is already had including stroke, and  significantly reduced ejection fraction, however patient does not like large pill burden.  Discussed with her about taking half a pill of spironolactone and she is agreeable.  At next visit, would recommend discussing adding on hydrochlorothiazide for blood pressure.  Protein-calorie malnutrition, severe (HCC) Patient with significant weight loss over the last few months.  Her current weight is 89 pounds, however daughter did bring in a log which showed her weight around 84/85 pounds without any cloths on.  Ms. Kurihara states that she never feels like eating and only has 1 small meal a day.  She has had workup for this before including TSH and HIV screening.  She is currently taking mirtazapine which is helping with her appetite.  If her weight is around the same as 89 that today's visit, could consider myeloma labs and celiac labs.  However, she has multiple severe comorbidities which could be responsible for her weight loss, she is also currently still smoking cigarettes and appetite is very low.    Acute stroke due to ischemia [facility]) Hospital course was complicated by right MCA stroke.  She is doing much better, and on my exam she does not have any deficits in her right arm or any facial droop on the right side.  She is currently taking  Brilinta 90 mg twice a day, and atorvastatin 40 mg.  She follows with neurology as well.  Patient discussed with Dr. Maryagnes Amos Madilynne Mullan, M.D. [facility] Internal Medicine, PGY-2 Pager: [phone] Date  10/02/2023 Time 9:57 AM

## 2023-10-02 NOTE — Telephone Encounter (Signed)
Return call to pt's daughter, no answer; left message of return call Pt was seen yesterday by Dr Nooruddin.

## 2023-10-02 NOTE — Telephone Encounter (Signed)
Spoke to patient's daughter, Debbe Odea to advise that due to stroke while hospital inpatient, we actually will just have patient come for an office appointment with Dr Barron Alvine on 01/06/24 to discuss colonoscopy then. Daughter verbalizes understanding and expresses appreciation for the call.

## 2023-10-02 NOTE — Progress Notes (Signed)
 Internal Medicine Clinic Attending  Case discussed with the resident physician at the time of the visit.  We reviewed the patient's history, exam, and pertinent patient test results.  I agree with the assessment, diagnosis, and plan of care documented in the resident's note.

## 2023-10-02 NOTE — Assessment & Plan Note (Addendum)
Patient with significant weight loss over the last few months.  Her current weight is 89 pounds, however daughter did bring in a log which showed her weight around 84/85 pounds without any cloths on.  Amy Howell states that she never feels like eating and only has 1 small meal a day.  She has had workup for this before including TSH and HIV screening.  She is currently taking mirtazapine which is helping with her appetite.  If her weight is around the same as 89 that today's visit, could consider myeloma labs and celiac labs.  However, she has multiple severe comorbidities which could be responsible for her weight loss, she is also currently still smoking cigarettes and appetite is very low.

## 2023-10-02 NOTE — Telephone Encounter (Signed)
Pt's daughter requesting to speak with a nurse about medication for appetite. Please call her back.

## 2023-10-02 NOTE — Assessment & Plan Note (Signed)
>>  ASSESSMENT AND PLAN FOR ACUTE ON CHRONIC HFREF (HEART FAILURE WITH REDUCED EJECTION FRACTION) (HCC) WRITTEN ON 10/02/2023  9:49 AM BY NOORUDDIN, SAAD, MD  Most recent echo while patient was hospitalized showed an ejection fraction of 30 to 35% with global hypokinesis of the left ventricle.  She was discharged with spironolactone 25 mg, Coreg 6.25 mg, and Farxiga 10 mg.  Of which, she is only taking her Coreg 6.25 mg in the morning, and half a pill at night.  I discussed the importance of all of these and and she seems to understand however states that she does not like taking pills and some of them make her stomach upset and she is not sure which she just stopped.  Had extensive discussion with the patient about the severity of her illnesses and how important it is to be on these medications.  Eventually, was able to get patient agreeable to taking half of her spironolactone pill and starting there.  At her next follow-up I would recommend asking her how she has been doing with it and if she is amenable to taking the full dose.  Plan: - Coreg 6.25 mg in the morning, half a pill at night - Half a pill of spironolactone

## 2023-10-02 NOTE — Assessment & Plan Note (Signed)
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  She is doing much better, and on my exam she does not have any deficits in her right arm or any facial droop on the right side.  She is currently taking  Brilinta 90 mg twice a day, and atorvastatin 40 mg.  She follows with neurology as well.

## 2023-10-03 ENCOUNTER — Other Ambulatory Visit (HOSPITAL_COMMUNITY): Payer: Self-pay

## 2023-10-03 ENCOUNTER — Encounter: Payer: Medicare (Managed Care) | Admitting: Gastroenterology

## 2023-10-08 ENCOUNTER — Ambulatory Visit (HOSPITAL_COMMUNITY): Payer: Medicare (Managed Care) | Attending: Cardiovascular Disease

## 2023-10-08 DIAGNOSIS — I3139 Other pericardial effusion (noninflammatory): Secondary | ICD-10-CM | POA: Diagnosis not present

## 2023-10-08 LAB — ECHOCARDIOGRAM LIMITED
Area-P 1/2: 4.8 cm2
MV M vel: 6.39 m/s
MV Peak grad: 163.3 mm[Hg]
P 1/2 time: 277 ms
Radius: 0.7 cm

## 2023-10-20 ENCOUNTER — Inpatient Hospital Stay: Payer: Self-pay | Admitting: Adult Health

## 2023-10-24 ENCOUNTER — Ambulatory Visit: Payer: Medicare (Managed Care) | Admitting: Nurse Practitioner

## 2023-10-24 ENCOUNTER — Ambulatory Visit (HOSPITAL_COMMUNITY): Admission: RE | Admit: 2023-10-24 | Payer: Medicare (Managed Care) | Source: Ambulatory Visit

## 2023-10-27 DIAGNOSIS — H401111 Primary open-angle glaucoma, right eye, mild stage: Secondary | ICD-10-CM | POA: Diagnosis not present

## 2023-10-27 DIAGNOSIS — H401123 Primary open-angle glaucoma, left eye, severe stage: Secondary | ICD-10-CM | POA: Diagnosis not present

## 2023-10-27 DIAGNOSIS — I639 Cerebral infarction, unspecified: Secondary | ICD-10-CM | POA: Diagnosis not present

## 2023-10-27 DIAGNOSIS — H538 Other visual disturbances: Secondary | ICD-10-CM | POA: Diagnosis not present

## 2023-10-31 ENCOUNTER — Ambulatory Visit (HOSPITAL_COMMUNITY)
Admission: RE | Admit: 2023-10-31 | Discharge: 2023-10-31 | Disposition: A | Discharge status: Home / Self Care | Payer: Medicare (Managed Care) | Source: Ambulatory Visit | Attending: Interventional Radiology | Admitting: Interventional Radiology

## 2023-10-31 DIAGNOSIS — I671 Cerebral aneurysm, nonruptured: Secondary | ICD-10-CM | POA: Diagnosis not present

## 2023-11-03 ENCOUNTER — Other Ambulatory Visit (HOSPITAL_BASED_OUTPATIENT_CLINIC_OR_DEPARTMENT_OTHER): Payer: Self-pay

## 2023-11-03 ENCOUNTER — Telehealth: Payer: Self-pay

## 2023-11-03 ENCOUNTER — Other Ambulatory Visit: Payer: Self-pay

## 2023-11-03 ENCOUNTER — Other Ambulatory Visit: Payer: Self-pay | Admitting: Student

## 2023-11-03 MED ORDER — CARVEDILOL 6.25 MG PO TABS
6.2500 mg | ORAL_TABLET | Freq: Two times a day (BID) | ORAL | 0 refills | Status: DC
  Filled 2023-11-03 – 2023-11-04 (×2): qty 60, 30d supply, fill #0

## 2023-11-03 NOTE — Telephone Encounter (Addendum)
Return call to pt's daughter who stated pt needs medication to increase her appetite. The daughter stated pt was on Mirtazapine 15 mg in the hospital; I told her pt has refills on this med (7.5 mg) . She asked her mother if this med helped, pt stated no. Requesting something stronger. Pt has an appt 12/19 with Dr Sherrilee Gilles but she's requesting something before her appt.

## 2023-11-03 NOTE — Telephone Encounter (Signed)
Pt's daughter requesting to speak with a nurse about medication for appetite. Please call her back.

## 2023-11-04 ENCOUNTER — Other Ambulatory Visit (HOSPITAL_BASED_OUTPATIENT_CLINIC_OR_DEPARTMENT_OTHER): Payer: Self-pay

## 2023-11-04 ENCOUNTER — Other Ambulatory Visit (HOSPITAL_COMMUNITY): Payer: Self-pay

## 2023-11-05 ENCOUNTER — Other Ambulatory Visit (HOSPITAL_BASED_OUTPATIENT_CLINIC_OR_DEPARTMENT_OTHER): Payer: Self-pay

## 2023-11-05 HISTORY — PX: IR RADIOLOGIST EVAL & MGMT: IMG5224

## 2023-11-17 ENCOUNTER — Other Ambulatory Visit: Payer: Self-pay | Admitting: Internal Medicine

## 2023-11-18 ENCOUNTER — Other Ambulatory Visit (HOSPITAL_COMMUNITY): Payer: Self-pay

## 2023-11-18 MED ORDER — TICAGRELOR 90 MG PO TABS
90.0000 mg | ORAL_TABLET | Freq: Two times a day (BID) | ORAL | 11 refills | Status: DC
  Filled 2023-11-18: qty 60, 30d supply, fill #0

## 2023-11-20 ENCOUNTER — Other Ambulatory Visit (HOSPITAL_COMMUNITY): Payer: Self-pay

## 2023-11-20 ENCOUNTER — Ambulatory Visit: Payer: Medicare (Managed Care) | Admitting: Student

## 2023-11-20 VITALS — BP 183/90 | HR 78 | Temp 97.6°F | Ht 60.0 in | Wt 91.0 lb

## 2023-11-20 DIAGNOSIS — Z1211 Encounter for screening for malignant neoplasm of colon: Secondary | ICD-10-CM

## 2023-11-20 DIAGNOSIS — E785 Hyperlipidemia, unspecified: Secondary | ICD-10-CM

## 2023-11-20 DIAGNOSIS — Z681 Body mass index (BMI) 19 or less, adult: Secondary | ICD-10-CM | POA: Diagnosis not present

## 2023-11-20 DIAGNOSIS — Z8673 Personal history of transient ischemic attack (TIA), and cerebral infarction without residual deficits: Secondary | ICD-10-CM

## 2023-11-20 DIAGNOSIS — R636 Underweight: Secondary | ICD-10-CM | POA: Diagnosis not present

## 2023-11-20 DIAGNOSIS — Z91148 Patient's other noncompliance with medication regimen for other reason: Secondary | ICD-10-CM

## 2023-11-20 DIAGNOSIS — I159 Secondary hypertension, unspecified: Secondary | ICD-10-CM

## 2023-11-20 DIAGNOSIS — K5900 Constipation, unspecified: Secondary | ICD-10-CM

## 2023-11-20 DIAGNOSIS — I5022 Chronic systolic (congestive) heart failure: Secondary | ICD-10-CM

## 2023-11-20 DIAGNOSIS — I10 Essential (primary) hypertension: Secondary | ICD-10-CM | POA: Diagnosis not present

## 2023-11-20 MED ORDER — SENNA 8.6 MG PO TABS
1.0000 | ORAL_TABLET | Freq: Every day | ORAL | 3 refills | Status: DC
  Filled 2023-11-20: qty 100, 100d supply, fill #0

## 2023-11-20 MED ORDER — POLYETHYLENE GLYCOL 3350 17 GM/SCOOP PO POWD
17.0000 g | Freq: Every day | ORAL | 0 refills | Status: DC
  Filled 2023-11-20: qty 510, 30d supply, fill #0
  Filled 2023-11-20: qty 30, 30d supply, fill #0

## 2023-11-20 NOTE — Progress Notes (Signed)
CC: constipation  HPI:  [name] is a 67 y.o. female living with a history stated below and presents today for constipation. Please see problem based assessment and plan for additional details.  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 30 tablet 3   atorvastatin (LIPITOR) 20 MG tablet Take 1 tablet (20 mg total) by mouth daily. 90 tablet 3   carvedilol (COREG) 6.25 MG tablet Take 1 tablet (6.25 mg total) by mouth 2 (two) times daily. 60 tablet 0   dorzolamide (TRUSOPT) 2 % ophthalmic solution Instill 1 drop into both eyes twice a day 30 mL 3   feeding supplement (ENSURE ENLIVE / ENSURE PLUS) LIQD Take 237 mLs by mouth 3 (three) times daily between meals. 237 mL 12   latanoprost (XALATAN) 0.005 % ophthalmic solution Instill 1 drop into both eyes at bedtime 22.5 mL 3   mirtazapine (REMERON SOL-TAB) 15 MG disintegrating tablet Dissolve 1/2 tablet (7.5 mg total) by mouth at bedtime. 45 tablet 7   Multiple Vitamin (MULTIVITAMIN WITH MINERALS) TABS tablet Take 1 tablet by mouth daily. 30 tablet 0   spironolactone (ALDACTONE) 25 MG tablet Take 1/2 tablet (12.5 mg total) by mouth daily. 45 tablet 3   ticagrelor (BRILINTA) 90 MG TABS tablet Take 1 tablet (90 mg total) by mouth 2 (two) times daily. 60 tablet 11   timolol (TIMOPTIC) 0.5 % ophthalmic solution Place 1 drop into both eyes every morning. 45 mL 3   No current facility-administered medications on file prior to visit.    Family History  Problem Relation Age of Onset   Dementia Mother     Kidney disease Father    Hypertension Father    Diabetes Father    Heart disease Father    Lung cancer Maternal Aunt        dx after 86   Prostate cancer Other        MGM's brother   Breast cancer Neg Hx    Colon cancer Neg Hx    Liver disease Neg Hx    Esophageal cancer Neg Hx     Social History   Socioeconomic History   Marital status: Single    Spouse name: Not on file   Number of children: 2   Years of education: Not on file   Highest education level: 10th grade  Occupational History   Occupation: retired  Tobacco Use   Smoking status: Former    Current packs/day: 0.50    Average packs/day: 0.5 packs/day for 56.0 years (28.0 ttl pk-yrs)    Types: Cigarettes   Smokeless tobacco: Never  Vaping Use   Vaping status: Never Used  Substance and Sexual Activity   Alcohol use: No   Drug use: No   Sexual activity: Not Currently    Birth control/protection: Surgical    Comment: Hyst  Other Topics Concern   Not on file  Social History Narrative   Not on file   Social Drivers  of Health   Financial Resource Strain: Low Risk  (09/17/2023)   Overall Financial Resource Strain (CARDIA)    Difficulty of Paying Living Expenses: Not hard at all  Food Insecurity: No Food Insecurity (09/22/2023)   Hunger Vital Sign    Worried About Running Out of Food in the Last Year: Never true    Ran Out of Food in the Last Year: Never true  Transportation Needs: No Transportation Needs (09/22/2023)   PRAPARE - Administrator, Civil Service (Medical): No    Lack of Transportation (Non-Medical): No  Physical Activity: Insufficiently Active (04/08/2023)   Exercise Vital Sign    Days of Exercise per Week: 3 days    Minutes of Exercise per Session: 30 min  Stress: No Stress Concern Present (04/08/2023)   [employer] of Occupational Health - Occupational Stress Questionnaire    Feeling of Stress : Not at all  Social Connections: Socially Isolated (04/08/2023)   Social  Connection and Isolation Panel [NHANES]    Frequency of Communication with Friends and Family: Three times a week    Frequency of Social Gatherings with Friends and Family: Twice a week    Attends Religious Services: Never    Database administrator or Organizations: No    Attends Banker Meetings: Never    Marital Status: Never married  Intimate Partner Violence: Not At Risk (09/13/2023)   Humiliation, Afraid, Rape, and Kick questionnaire    Fear of Current or Ex-Partner: No    Emotionally Abused: No    Physically Abused: No    Sexually Abused: No    Review of Systems: ROS negative except for what is noted on the assessment and plan.  Vitals:   11/20/23 1545 11/20/23 1629  BP: (!) 173/80 (!) 183/90  Pulse: 81 78  Temp: 97.6 F (36.4 C)   TempSrc: Oral   SpO2: 100%   Weight: 91 lb (41.3 kg)   Height: 5' (1.524 m)    Physical Exam: Constitutional: Chronically ill-appearing female sitting in chair comfortably, in no acute distress Cardiovascular: Regular rate and rhythm Pulmonary/Chest: Normal work of breathing on room air, lungs clear to auscultation bilaterally Abdomen: Soft, nontender, nondistended Neurological: alert & oriented x 3 Psych: Normal mood and affect  Assessment & Plan:   Hypertension Poorly controlled.  Repeat BP still elevated at 183/90.  Denies any symptoms at this time.  Patient is prescribed carvedilol 6.25 twice daily and spironolactone 12.5 mg daily.  Patient reports not taking spironolactone due to side effects, states "made me feel off".  Did not tolerate amlodipine previously due to reported headaches.  Has history of angioedema with lisinopril. Discussed extensively with patient and patient's daughter about other antihypertensives but patient declined adding or restarting meds. Provided patient's daughter return precautions if hypertensive symptoms develop to seek emergent care.  Plan -Continue carvedilol 6.25 mg twice daily -Encourage  restarting spironolactone if tolerated -Return precautions given for hypertensive symptoms -Plan for follow-up visit to discuss GOC  Constipation Endorses constipation. Reports BM have been small and pebble like. Denies blood. Not taken anything OTC yet. Abdominal exam unremarkable today.  Will send in daily bowel regimen, patient and patient's daughter voices understanding of plan.  Plan -Start Miralax and Senna daily, titrate up if needed  Underweight BMI 17.77.  Difficulty gaining weight.  Recently was told to increase mirtazapine to 15 mg nightly.  Patient reports no significant improvement. Does not wanted any further medications. Encouraged patient to supplement with nutritional  shakes.   Colon cancer screening Rescheduled GI visit to February.  Hyperlipidemia Prescribed atorvastatin 20 mg daily but patient reports not taking medication.  Patient remains hesitant on restarting certain medications and declined further discussions on restarting statin.  Medication nonadherence due to intolerance Medication nonadherence due to multiple factors including intolerance and concern for potential side effects per patient.  Patient states "do not want to be a [place] pig" when discussing medications for her chronic conditions.  Had extensive discussions about her chronic diseases and necessary medications.  Daughter cares for patient.  Expresses that she is able to provide the medications but ultimately it is patient's decision to take them or not.  I believe there is a disconnect and needs further discussion/evaluation regarding patient's goals.  She has specialist that she goes to see but certain medications she declines taking or will only take half or quarter tablets of them.    Patient discussed with Dr. Halina Andreas, D.O. [facility], PGY-2 Phone: [phone] Date 11/20/2023 Time 3:56 PM

## 2023-11-20 NOTE — Patient Instructions (Addendum)
Thank you, [name] for allowing [place] to provide your care today. Today we discussed overall health.  -Miralax and Senna daily for constipation. -Plan for longer meeting to discuss further ongoing care for you.  -I am worried about your high blood pressure so please if able continue taking carvedilol and spironolactone.   -Follow up with your GI doctor and Interventional Radiology.   I have ordered the following medication/changed the following medications:   Stop the following medications: There are no discontinued medications.   Start the following medications: Meds ordered this encounter  Medications   senna (SENOKOT) 8.6 MG TABS tablet    Sig: Take 1 tablet (8.6 mg total) by mouth daily.    Dispense:  100 tablet    Refill:  3   polyethylene glycol powder (MIRALAX) 17 GM/SCOOP powder    Sig: Take 17 g by mouth daily.    Dispense:  510 g    Refill:  0     Follow up:  1 month for further discussion of care    Should you have any questions or concerns please call the internal medicine clinic at [phone].    Rana Snare, D.O. [facility]

## 2023-11-21 ENCOUNTER — Other Ambulatory Visit (HOSPITAL_COMMUNITY): Payer: Self-pay

## 2023-11-21 DIAGNOSIS — K5900 Constipation, unspecified: Secondary | ICD-10-CM | POA: Insufficient documentation

## 2023-11-21 DIAGNOSIS — Z91148 Patient's other noncompliance with medication regimen for other reason: Secondary | ICD-10-CM | POA: Insufficient documentation

## 2023-11-21 NOTE — Assessment & Plan Note (Signed)
Medication nonadherence due to multiple factors including intolerance and concern for potential side effects per patient.  Patient states "do not want to be a [place] pig" when discussing medications for her chronic conditions.  Had extensive discussions about her chronic diseases and necessary medications.  Daughter cares for patient.  Expresses that she is able to provide the medications but ultimately it is patient's decision to take them or not.  I believe there is a disconnect and needs further discussion/evaluation regarding patient's goals.  She has specialist that she goes to see but certain medications she declines taking or will only take half or quarter tablets of them.

## 2023-11-21 NOTE — Progress Notes (Signed)
Internal Medicine Clinic Attending  Case discussed with the resident at the time of the visit.  We reviewed the resident's history and exam and pertinent patient test results.  I agree with the assessment, diagnosis, and plan of care documented in the resident's note.  

## 2023-11-21 NOTE — Assessment & Plan Note (Signed)
Rescheduled GI visit to February.

## 2023-11-21 NOTE — Assessment & Plan Note (Signed)
BMI 17.77.  Difficulty gaining weight.  Recently was told to increase mirtazapine to 15 mg nightly.  Patient reports no significant improvement. Does not wanted any further medications. Encouraged patient to supplement with nutritional shakes.

## 2023-11-21 NOTE — Assessment & Plan Note (Addendum)
Poorly controlled.  Repeat BP still elevated at 183/90.  Denies any symptoms at this time.  Patient is prescribed carvedilol 6.25 twice daily and spironolactone 12.5 mg daily.  Patient reports not taking spironolactone due to side effects, states "made me feel off".  Did not tolerate amlodipine previously due to reported headaches.  Has history of angioedema with lisinopril. Discussed extensively with patient and patient's daughter about other antihypertensives but patient declined adding or restarting meds. Provided patient's daughter return precautions if hypertensive symptoms develop to seek emergent care.  Plan -Continue carvedilol 6.25 mg twice daily -Encourage restarting spironolactone if tolerated -Return precautions given for hypertensive symptoms -Plan for follow-up visit to discuss GOC

## 2023-11-21 NOTE — Assessment & Plan Note (Addendum)
Endorses constipation. Reports BM have been small and pebble like. Denies blood. Not taken anything OTC yet. Abdominal exam unremarkable today.  Will send in daily bowel regimen, patient and patient's daughter voices understanding of plan.  Plan -Start Miralax and Senna daily, titrate up if needed

## 2023-11-21 NOTE — Assessment & Plan Note (Signed)
Prescribed atorvastatin 20 mg daily but patient reports not taking medication.  Patient remains hesitant on restarting certain medications and declined further discussions on restarting statin.

## 2023-11-30 ENCOUNTER — Emergency Department (HOSPITAL_COMMUNITY): Payer: Medicare (Managed Care)

## 2023-11-30 ENCOUNTER — Inpatient Hospital Stay (HOSPITAL_COMMUNITY)
Admission: EM | Admit: 2023-11-30 | Discharge: 2023-12-03 | DRG: 291 | Disposition: A | Discharge status: Home / Self Care | Payer: Medicare (Managed Care) | Attending: Internal Medicine | Admitting: Internal Medicine

## 2023-11-30 ENCOUNTER — Emergency Department (HOSPITAL_BASED_OUTPATIENT_CLINIC_OR_DEPARTMENT_OTHER): Payer: Medicare (Managed Care)

## 2023-11-30 ENCOUNTER — Encounter (HOSPITAL_COMMUNITY): Payer: Self-pay

## 2023-11-30 ENCOUNTER — Other Ambulatory Visit: Payer: Self-pay

## 2023-11-30 DIAGNOSIS — E1122 Type 2 diabetes mellitus with diabetic chronic kidney disease: Secondary | ICD-10-CM | POA: Diagnosis present

## 2023-11-30 DIAGNOSIS — Z820 Family history of epilepsy and other diseases of the nervous system: Secondary | ICD-10-CM

## 2023-11-30 DIAGNOSIS — N1831 Chronic kidney disease, stage 3a: Secondary | ICD-10-CM | POA: Diagnosis present

## 2023-11-30 DIAGNOSIS — R627 Adult failure to thrive: Secondary | ICD-10-CM | POA: Diagnosis present

## 2023-11-30 DIAGNOSIS — M7989 Other specified soft tissue disorders: Secondary | ICD-10-CM | POA: Diagnosis not present

## 2023-11-30 DIAGNOSIS — Z95810 Presence of automatic (implantable) cardiac defibrillator: Secondary | ICD-10-CM | POA: Diagnosis not present

## 2023-11-30 DIAGNOSIS — Z9011 Acquired absence of right breast and nipple: Secondary | ICD-10-CM | POA: Diagnosis not present

## 2023-11-30 DIAGNOSIS — H409 Unspecified glaucoma: Secondary | ICD-10-CM | POA: Diagnosis present

## 2023-11-30 DIAGNOSIS — I110 Hypertensive heart disease with heart failure: Secondary | ICD-10-CM | POA: Diagnosis not present

## 2023-11-30 DIAGNOSIS — R1310 Dysphagia, unspecified: Secondary | ICD-10-CM | POA: Diagnosis not present

## 2023-11-30 DIAGNOSIS — Z7902 Long term (current) use of antithrombotics/antiplatelets: Secondary | ICD-10-CM

## 2023-11-30 DIAGNOSIS — C50919 Malignant neoplasm of unspecified site of unspecified female breast: Secondary | ICD-10-CM | POA: Diagnosis present

## 2023-11-30 DIAGNOSIS — I2510 Atherosclerotic heart disease of native coronary artery without angina pectoris: Secondary | ICD-10-CM | POA: Diagnosis present

## 2023-11-30 DIAGNOSIS — Z860100 Personal history of colon polyps, unspecified: Secondary | ICD-10-CM

## 2023-11-30 DIAGNOSIS — K5730 Diverticulosis of large intestine without perforation or abscess without bleeding: Secondary | ICD-10-CM | POA: Diagnosis not present

## 2023-11-30 DIAGNOSIS — I509 Heart failure, unspecified: Secondary | ICD-10-CM | POA: Diagnosis present

## 2023-11-30 DIAGNOSIS — Z95828 Presence of other vascular implants and grafts: Secondary | ICD-10-CM

## 2023-11-30 DIAGNOSIS — Z681 Body mass index (BMI) 19 or less, adult: Secondary | ICD-10-CM

## 2023-11-30 DIAGNOSIS — R634 Abnormal weight loss: Secondary | ICD-10-CM | POA: Diagnosis present

## 2023-11-30 DIAGNOSIS — I255 Ischemic cardiomyopathy: Secondary | ICD-10-CM | POA: Diagnosis present

## 2023-11-30 DIAGNOSIS — I420 Dilated cardiomyopathy: Secondary | ICD-10-CM | POA: Diagnosis present

## 2023-11-30 DIAGNOSIS — E785 Hyperlipidemia, unspecified: Secondary | ICD-10-CM | POA: Diagnosis present

## 2023-11-30 DIAGNOSIS — R1314 Dysphagia, pharyngoesophageal phase: Secondary | ICD-10-CM | POA: Diagnosis not present

## 2023-11-30 DIAGNOSIS — K5900 Constipation, unspecified: Secondary | ICD-10-CM

## 2023-11-30 DIAGNOSIS — I10 Essential (primary) hypertension: Secondary | ICD-10-CM | POA: Diagnosis present

## 2023-11-30 DIAGNOSIS — Z841 Family history of disorders of kidney and ureter: Secondary | ICD-10-CM

## 2023-11-30 DIAGNOSIS — Z79899 Other long term (current) drug therapy: Secondary | ICD-10-CM

## 2023-11-30 DIAGNOSIS — Z8673 Personal history of transient ischemic attack (TIA), and cerebral infarction without residual deficits: Secondary | ICD-10-CM

## 2023-11-30 DIAGNOSIS — R1319 Other dysphagia: Secondary | ICD-10-CM

## 2023-11-30 DIAGNOSIS — D509 Iron deficiency anemia, unspecified: Secondary | ICD-10-CM | POA: Diagnosis present

## 2023-11-30 DIAGNOSIS — Z555 Less than a high school diploma: Secondary | ICD-10-CM

## 2023-11-30 DIAGNOSIS — F17210 Nicotine dependence, cigarettes, uncomplicated: Secondary | ICD-10-CM | POA: Diagnosis present

## 2023-11-30 DIAGNOSIS — I5023 Acute on chronic systolic (congestive) heart failure: Secondary | ICD-10-CM | POA: Diagnosis present

## 2023-11-30 DIAGNOSIS — E43 Unspecified severe protein-calorie malnutrition: Secondary | ICD-10-CM | POA: Diagnosis present

## 2023-11-30 DIAGNOSIS — I130 Hypertensive heart and chronic kidney disease with heart failure and stage 1 through stage 4 chronic kidney disease, or unspecified chronic kidney disease: Secondary | ICD-10-CM | POA: Diagnosis present

## 2023-11-30 DIAGNOSIS — R911 Solitary pulmonary nodule: Secondary | ICD-10-CM | POA: Diagnosis present

## 2023-11-30 DIAGNOSIS — Z801 Family history of malignant neoplasm of trachea, bronchus and lung: Secondary | ICD-10-CM

## 2023-11-30 DIAGNOSIS — I3139 Other pericardial effusion (noninflammatory): Secondary | ICD-10-CM | POA: Diagnosis present

## 2023-11-30 DIAGNOSIS — R64 Cachexia: Secondary | ICD-10-CM | POA: Diagnosis present

## 2023-11-30 DIAGNOSIS — Z8249 Family history of ischemic heart disease and other diseases of the circulatory system: Secondary | ICD-10-CM

## 2023-11-30 DIAGNOSIS — J439 Emphysema, unspecified: Secondary | ICD-10-CM | POA: Diagnosis present

## 2023-11-30 DIAGNOSIS — E8809 Other disorders of plasma-protein metabolism, not elsewhere classified: Secondary | ICD-10-CM | POA: Diagnosis present

## 2023-11-30 DIAGNOSIS — R0602 Shortness of breath: Secondary | ICD-10-CM | POA: Diagnosis not present

## 2023-11-30 DIAGNOSIS — I700 Atherosclerosis of aorta: Secondary | ICD-10-CM | POA: Diagnosis not present

## 2023-11-30 DIAGNOSIS — R918 Other nonspecific abnormal finding of lung field: Secondary | ICD-10-CM | POA: Diagnosis not present

## 2023-11-30 DIAGNOSIS — Z888 Allergy status to other drugs, medicaments and biological substances status: Secondary | ICD-10-CM

## 2023-11-30 DIAGNOSIS — D508 Other iron deficiency anemias: Secondary | ICD-10-CM

## 2023-11-30 DIAGNOSIS — Z833 Family history of diabetes mellitus: Secondary | ICD-10-CM

## 2023-11-30 DIAGNOSIS — Z91148 Patient's other noncompliance with medication regimen for other reason: Secondary | ICD-10-CM

## 2023-11-30 DIAGNOSIS — Z7982 Long term (current) use of aspirin: Secondary | ICD-10-CM

## 2023-11-30 DIAGNOSIS — Z91014 Allergy to mammalian meats: Secondary | ICD-10-CM

## 2023-11-30 DIAGNOSIS — Z955 Presence of coronary angioplasty implant and graft: Secondary | ICD-10-CM

## 2023-11-30 LAB — COMPREHENSIVE METABOLIC PANEL
ALT: 45 U/L — ABNORMAL HIGH (ref 0–44)
AST: 41 U/L (ref 15–41)
Albumin: 2.5 g/dL — ABNORMAL LOW (ref 3.5–5.0)
Alkaline Phosphatase: 117 U/L (ref 38–126)
Anion gap: 7 (ref 5–15)
BUN: 24 mg/dL — ABNORMAL HIGH (ref 8–23)
CO2: 21 mmol/L — ABNORMAL LOW (ref 22–32)
Calcium: 8.3 mg/dL — ABNORMAL LOW (ref 8.9–10.3)
Chloride: 109 mmol/L (ref 98–111)
Creatinine, Ser: 1.31 mg/dL — ABNORMAL HIGH (ref 0.44–1.00)
GFR, Estimated: 44 mL/min — ABNORMAL LOW (ref >60)
Glucose, Bld: 103 mg/dL — ABNORMAL HIGH (ref 70–99)
Potassium: 3.7 mmol/L (ref 3.5–5.1)
Sodium: 137 mmol/L (ref 135–145)
Total Bilirubin: 0.6 mg/dL (ref <1.2)
Total Protein: 5.7 g/dL — ABNORMAL LOW (ref 6.5–8.1)

## 2023-11-30 LAB — CBC WITH DIFFERENTIAL/PLATELET
Abs Immature Granulocytes: 0 10*3/uL (ref 0.00–0.07)
Basophils Absolute: 0 10*3/uL (ref 0.0–0.1)
Basophils Relative: 0 %
Eosinophils Absolute: 0.1 10*3/uL (ref 0.0–0.5)
Eosinophils Relative: 2 %
HCT: 34.5 % — ABNORMAL LOW (ref 36.0–46.0)
Hemoglobin: 10.7 g/dL — ABNORMAL LOW (ref 12.0–15.0)
Immature Granulocytes: 0 %
Lymphocytes Relative: 22 %
Lymphs Abs: 1 10*3/uL (ref 0.7–4.0)
MCH: 29.5 pg (ref 26.0–34.0)
MCHC: 31 g/dL (ref 30.0–36.0)
MCV: 95 fL (ref 80.0–100.0)
Monocytes Absolute: 0.4 10*3/uL (ref 0.1–1.0)
Monocytes Relative: 9 %
Neutro Abs: 3 10*3/uL (ref 1.7–7.7)
Neutrophils Relative %: 67 %
Platelets: 173 10*3/uL (ref 150–400)
RBC: 3.63 MIL/uL — ABNORMAL LOW (ref 3.87–5.11)
RDW: 17.2 % — ABNORMAL HIGH (ref 11.5–15.5)
WBC: 4.5 10*3/uL (ref 4.0–10.5)
nRBC: 0 % (ref 0.0–0.2)

## 2023-11-30 LAB — TROPONIN I (HIGH SENSITIVITY)
Troponin I (High Sensitivity): 5 ng/L (ref <18)
Troponin I (High Sensitivity): 5 ng/L (ref <18)

## 2023-11-30 LAB — BRAIN NATRIURETIC PEPTIDE: B Natriuretic Peptide: 2352.4 pg/mL — ABNORMAL HIGH (ref 0.0–100.0)

## 2023-11-30 MED ORDER — ASPIRIN 81 MG PO CHEW
81.0000 mg | CHEWABLE_TABLET | Freq: Every day | ORAL | Status: DC
  Administered 2023-12-01 – 2023-12-03 (×3): 81 mg via ORAL
  Filled 2023-11-30 (×3): qty 1

## 2023-11-30 MED ORDER — ACETAMINOPHEN 325 MG PO TABS: 650.0000 mg | ORAL_TABLET | Freq: Four times a day (QID) | ORAL | Status: DC | PRN

## 2023-11-30 MED ORDER — ACETAMINOPHEN 650 MG RE SUPP: 650.0000 mg | Freq: Four times a day (QID) | RECTAL | Status: DC | PRN

## 2023-11-30 MED ORDER — POLYETHYLENE GLYCOL 3350 17 G PO PACK
17.0000 g | PACK | Freq: Every day | ORAL | Status: DC
  Filled 2023-11-30 (×3): qty 1

## 2023-11-30 MED ORDER — CARVEDILOL 3.125 MG PO TABS
6.2500 mg | ORAL_TABLET | Freq: Two times a day (BID) | ORAL | Status: DC
  Administered 2023-12-01 (×2): 6.25 mg via ORAL
  Filled 2023-11-30 (×2): qty 2

## 2023-11-30 MED ORDER — TICAGRELOR 90 MG PO TABS
90.0000 mg | ORAL_TABLET | Freq: Two times a day (BID) | ORAL | Status: DC
  Administered 2023-12-01 – 2023-12-03 (×6): 90 mg via ORAL
  Filled 2023-11-30 (×6): qty 1

## 2023-11-30 MED ORDER — FUROSEMIDE 10 MG/ML IJ SOLN
20.0000 mg | Freq: Once | INTRAMUSCULAR | Status: AC
  Administered 2023-11-30: 20 mg via INTRAVENOUS
  Filled 2023-11-30: qty 2

## 2023-11-30 MED ORDER — IOHEXOL 300 MG/ML  SOLN
30.0000 mL | Freq: Once | INTRAMUSCULAR | Status: AC | PRN
  Administered 2023-11-30: 30 mL via ORAL

## 2023-11-30 MED ORDER — RIVAROXABAN 10 MG PO TABS: 10.0000 mg | ORAL_TABLET | Freq: Every day | ORAL | Status: DC

## 2023-11-30 NOTE — ED Provider Triage Note (Cosign Needed)
Emergency Medicine Provider Triage Evaluation Note  Amy Howell , a 67 y.o. female  was evaluated in triage.  Pt complains of bilateral leg swelling and SOB since 12/27. Denies any trauma or recent surgeries. Also reports some constipation. SOB when trying to have BM. Had BM this morning but feels constipated. Is passing gas.  Review of Systems  Positive: SOB Negative: Chest pain  Physical Exam  BP (!) 158/80 (BP Location: Right Arm)   Pulse 82   Temp 97.9 F (36.6 C)   Resp 18   Ht 5' (1.524 m)   Wt 37.2 kg   SpO2 99%   BMI 16.01 kg/m  Gen:   Awake, no distress   Resp:  Normal effort  MSK:   Moves extremities without difficulty  Other:  1+ pitting edema  Medical Decision Making  Medically screening exam initiated at 12:28 PM.  Appropriate orders placed.  Amy Howell was informed that the remainder of the evaluation will be completed by another provider, this initial triage assessment does not replace that evaluation, and the importance of remaining in the ED until their evaluation is complete.    Pete Pelt, [place] 11/30/23 1231

## 2023-11-30 NOTE — H&P (Incomplete)
Date: 11/30/2023               Patient Name:  Amy Howell MRN: [id number]  DOB: 1956-03-14 Age / Sex: 67 y.o., female   PCP: Rana Snare, DO         Medical Service: Internal Medicine Teaching Service         Attending Physician: Dr. Reymundo Poll, MD      First Contact: Dr. Manuela Neptune, MD Pager [phone]    Second Contact: Dr. Marrianne Mood, MD Pager [phone]         After Hours (After 5p/  First Contact Pager: [phone]  weekends / holidays): Second Contact Pager: [phone]   SUBJECTIVE   Chief Complaint: Subjective shortness of breath,   History of Present Illness:   This is a 67 year old patient with pertinent past medical history of HFrEF, microcytic anemia, hypertension, prior CVA, saccular aneurysms, invasive lobular carcinoma of the breast status post resection, CKD 3A, and poor appetite/weight loss presenting with progressive exertional dyspnea, fatigue, and swelling of the lower extremities.  Patient states that they have had increased orthopnea (5 pillows), exertional dyspnea, fatigue and weight loss since her last hospital discharge in October.  They state that they have not been taking many other medications due to side effects which they have experienced.  They are currently taking aspirin, Brilinta, and carvedilol.  They are not taking atorvastatin, mirtazapine, spironolactone.  They have noticed increased leg swelling over the last 2 days which prompted their presentation to the emergency department.  They also state that they have had dysphagia to solids and liquids since hospital discharge.  They state that they cough up white phlegm, after drinking hot waters or teas.  They get short of breath when straining on the toilet, and endorse hard pellet-like stools as well as mucus in their stools.  Patient states that at one point they felt the need to pass flatus and were incontinent of mucus.  Patient endorses orthopnea, lower extremity edema,  weight loss.  Denies melena, hematemesis, hematuria, abdominal pain, or headache.  Medications: Aspirin 81 mg daily Atorvastatin 20 mg daily- not taking  Carvedilol 6.25 mg twice daily Dorzolamide 1 drop twice daily Latanoprost 1 drop nightly in both eyes Mirtazapine 7.5 mg nightly- not taking  Multivitamin  Spironolactone 12.5 mg daily- not taking  Brilinta 90 mg twice daily Timolol 1 drop both eyes daily   ED Course: CBC demonstrating microcytic anemia with increased RDW. CMP demonstrating elevated creatinine 1.31, around baseline.  Decreased albumin 2.5. Troponins within normal limits and flat BNP elevated to 2300  Chest x-ray showing nonspecific interstitial prominence Abdominal x-ray negative CT abdomen pelvis with oral contrast demonstrating cardiomegaly with small pericardial effusion, small bilateral pleural effusions right greater than left, bibasilar atelectasis, diffuse colonic diverticulosis without diverticulitis, aortic atherosclerosis and emphysema. Given 20 mg IV Lasix   Meds:  No outpatient medications have been marked as taking for the 11/30/23 encounter [facility] Encounter).    Past Medical History  Past Surgical History:  Procedure Laterality Date   ABDOMINAL HYSTERECTOMY     CARDIAC DEFIBRILLATOR PLACEMENT     CORONARY STENT INTERVENTION N/A 11/26/2022   Procedure: CORONARY STENT INTERVENTION;  Surgeon: Corky Crafts, MD;  Location: [facility];  Service: Cardiovascular;  Laterality: N/A;   gum operation     Implantable loop recorder removal  02/01/2019   MDT Reveal LINQ explantation in office by JA   IR 3D INDEPENDENT WKST  05/07/2017   IR 3D  INDEPENDENT WKST  09/17/2023   IR ANGIO INTRA EXTRACRAN SEL COM CAROTID INNOMINATE BILAT MOD SED  04/23/2017   IR ANGIO INTRA EXTRACRAN SEL COM CAROTID INNOMINATE BILAT MOD SED  08/06/2017   IR ANGIO INTRA EXTRACRAN SEL COM CAROTID INNOMINATE UNI R MOD SED  05/07/2017   IR ANGIO INTRA  EXTRACRAN SEL INTERNAL CAROTID BILAT MOD SED  09/17/2023   IR ANGIO VERTEBRAL SEL VERTEBRAL BILAT MOD SED  04/23/2017   IR ANGIO VERTEBRAL SEL VERTEBRAL BILAT MOD SED  08/06/2017   IR ANGIOGRAM FOLLOW UP STUDY  05/07/2017   IR ANGIOGRAM SELECTIVE EACH ADDITIONAL VESSEL  05/07/2017   IR RADIOLOGIST EVAL & MGMT  04/16/2017   IR RADIOLOGIST EVAL & MGMT  05/29/2017   IR RADIOLOGIST EVAL & MGMT  09/29/2023   IR RADIOLOGIST EVAL & MGMT  11/05/2023   IR TRANSCATH/EMBOLIZ  05/07/2017   LOOP RECORDER INSERTION N/A 03/28/2017   Procedure: Loop Recorder Insertion;  Surgeon: Hillis Range, MD;  Location: [facility];  Service: Cardiovascular;  Laterality: N/A;   MASTECTOMY W/ SENTINEL NODE BIOPSY Right 06/25/2022   Procedure: RIGHT MASTECTOMY WITH AXILLARY SENTINEL LYMPH NODE BIOPSY;  Surgeon: Manus Rudd, MD;  Location: [facility];  Service: General;  Laterality: Right;   RADIOLOGY WITH ANESTHESIA N/A 05/07/2017   Procedure: EMBOLIZATION;  Surgeon: Julieanne Cotton, MD;  Location: [facility] OR;  Service: Radiology;  Laterality: N/A;   RADIOLOGY WITH ANESTHESIA N/A 08/06/2017   Procedure: RADIOLOGY WITH ANESTHESIA STENTING;  Surgeon: Julieanne Cotton, MD;  Location: [facility] OR;  Service: Radiology;  Laterality: N/A;   RIGHT/LEFT HEART CATH AND CORONARY ANGIOGRAPHY N/A 11/26/2022   Procedure: RIGHT/LEFT HEART CATH AND CORONARY ANGIOGRAPHY;  Surgeon: Corky Crafts, MD;  Location[facility] CV LAB;  Service: Cardiovascular;  Laterality: N/A;   TEE WITHOUT CARDIOVERSION N/A 03/28/2017   Procedure: TRANSESOPHAGEAL ECHOCARDIOGRAM (TEE);  Surgeon: Lewayne Bunting, MD;  Location: [facility] ENDOSCOPY;  Service: Cardiovascular;  Laterality: N/A;    Social:  Lives with: Lives at home with Daughter  Occupation: Retired from [employer] at age of 60 Level of Function: mostly independent in ADLs, requires assistance PCP: Dr Rana Snare Substances: 1 cigarette 2 puffs here and there but has  been smoking for many years, no alcohol, or illicit substance use    Allergies: Allergies as of 11/30/2023 - Review Complete 11/30/2023  Allergen Reaction Noted   Bidil [isosorb dinitrate-hydralazine] Other (See Comments) 09/24/2023   Lisinopril Other (See Comments) 03/23/2020   Pork-derived products Hives 11/26/2022   Rosuvastatin Other (See Comments) 03/17/2023    Review of Systems: A complete ROS was negative except as per HPI.   OBJECTIVE:   Physical Exam: Blood pressure (!) 177/94, pulse 95, temperature 98 F (36.7 C), temperature source Oral, resp. rate 17, height 5' (1.524 m), weight 37.2 kg, SpO2 98%.   Constitutional: Chronically ill-appearing in no acute distress. Neck: No lymphadenopathy appreciated Cardiovascular: Tachycardic rate and rhythm, no m/r/g Pulmonary/Chest: normal work of breathing on room air, bibasilar crackles no wheezing appreciated.   Abdominal: soft, non-tender, non-distended Neurological: alert & oriented x 3, 5/5 strength in bilateral upper and lower extremities.  Cranial nerves grossly intact Extremities: 1+ pitting edema to midshin bilaterally.  Labs: CBC    Component Value Date/Time   WBC 4.5 11/30/2023 1232   RBC 3.63 (L) 11/30/2023 1232   HGB 10.7 (L) 11/30/2023 1232   HGB 12.1 06/12/2022 0807   HGB 12.4 06/28/2021 1525   HCT 34.5 (L) 11/30/2023  1232   HCT 37.8 06/28/2021 1525   PLT 173 11/30/2023 1232   PLT 169 06/12/2022 0807   PLT 201 06/28/2021 1525   MCV 95.0 11/30/2023 1232   MCV 91 06/28/2021 1525   MCH 29.5 11/30/2023 1232   MCHC 31.0 11/30/2023 1232   RDW 17.2 (H) 11/30/2023 1232   RDW 12.5 06/28/2021 1525   LYMPHSABS 1.0 11/30/2023 1232   LYMPHSABS 1.7 06/28/2021 1525   MONOABS 0.4 11/30/2023 1232   EOSABS 0.1 11/30/2023 1232   EOSABS 0.2 06/28/2021 1525   BASOSABS 0.0 11/30/2023 1232   BASOSABS 0.0 06/28/2021 1525     CMP     Component Value Date/Time   NA 137 11/30/2023 1232   NA 139 05/08/2023 1438   K  3.7 11/30/2023 1232   CL 109 11/30/2023 1232   CO2 21 (L) 11/30/2023 1232   GLUCOSE 103 (H) 11/30/2023 1232   BUN 24 (H) 11/30/2023 1232   BUN 29 (H) 05/08/2023 1438   CREATININE 1.31 (H) 11/30/2023 1232   CREATININE 1.21 (H) 06/12/2022 0807   CALCIUM 8.3 (L) 11/30/2023 1232   PROT 5.7 (L) 11/30/2023 1232   PROT 7.3 06/30/2023 0805   ALBUMIN 2.5 (L) 11/30/2023 1232   ALBUMIN 4.1 06/30/2023 0805   AST 41 11/30/2023 1232   AST 17 06/12/2022 0807   ALT 45 (H) 11/30/2023 1232   ALT 15 06/12/2022 0807   ALKPHOS 117 11/30/2023 1232   BILITOT 0.6 11/30/2023 1232   BILITOT 0.2 06/30/2023 0805   BILITOT 0.4 06/12/2022 0807   GFRNONAA 44 (L) 11/30/2023 1232   GFRNONAA 49 (L) 06/12/2022 0807   GFRAA 68 07/15/2019 1103    Imaging: Chest x-ray showing nonspecific interstitial prominence Abdominal x-ray negative CT abdomen pelvis with oral contrast demonstrating cardiomegaly with small pericardial effusion, small bilateral pleural effusions right greater than left, bibasilar atelectasis, diffuse colonic diverticulosis without diverticulitis, aortic atherosclerosis and emphysema. Given 20 mg IV Lasix  EKG: personally reviewed my interpretation is left ventricular hypertrophy. Prior EKG similar  ASSESSMENT & PLAN:   Assessment & Plan by Problem: Principal Problem:   Acute exacerbation of CHF (congestive heart failure) (HCC) Active Problems:   Hypertension   Glaucoma of both eyes   Hyperlipidemia   Constipation   History of CVA (cerebrovascular accident)   Jehilyn Delahoya is a 67 y.o. patient with pertinent past medical history of HFrEF, microcytic anemia, hypertension, prior CVA, saccular aneurysms, invasive lobular carcinoma of the breast status post resection, CKD 3A, and poor appetite/weight loss presenting with progressive exertional dyspnea, fatigue, and swelling of the lower extremities, admitted for shortness of breath and failure to thrive on hospital day 0  #HFrEF   #Pericardial effusion #Pleural effusions #Hypertension #hx or Coronary stenting Discontinued much of her GDMT regiment due to side effects per patient.  Prescribed spironolactone 12.5 daily, Coreg 6.25 twice daily.  Declined rest of GDMT at cardiology appointment.  Status post 20 mg IV Lasix in the ED, patient says she is making a lot of urine following this treatment.  Status post successful PCI to the mid RCA in December 2023.  Prescribed statin (Lipitor 40), which she is not taking at this time.  She is taking twice daily Brilinta as well as daily baby aspirin.  #History of CVA #History of saccular aneurysms At time of last discharge was noted that she had 5 mm x 3.1 mm x 4 mm right MCA trunk saccular aneurysm., 2.4 mm x 2.5 mm saccular aneurysm at the origin  of the inferior division of the right MCA, 2.7 mm x 2.6 mm saccular aneurysm arising from the posterior wall of the left ICA paraclinoid region. These were not treated at hospitalization as patient may need to gain weight. Was to follow with Dr. Corliss Skains in 1-2 months as outpatient to discuss treatment for this.   #History of breast cancer #Pulmonary nodule #Hypoalbuminemia #Poor appetite Was prescribed anastrazole, but stopped taking this due to side effects.  Patient states this caused her a lot of sedation.  Was prescribed mirtazapine for appetite, but stopped taking this due to side effect.  Some concern at time of last hospital discharge for ongoing malignant process, no further workup in the interim.   #Microcytic anemia #CKD 3A #Underweight  #Dysphagia    Diet: {NAMES:3044014::"Normal","Heart Healthy","Carb-Modified","Renal","Carb/Renal","NPO","TPN","Tube Feeds"} VTE: {NAMES:3044014::"Heparin","Enoxaparin","SCDs","DOAC","None"} IVF: {NAMES:3044014::"None","NS","1/2 NS","LR","D5","D10"},{NAMES:3044014::"None","10cc/hr","25cc/hr","50cc/hr","75cc/hr","100cc/hr","110cc/hr","125cc/hr","Bolus"} Code:  {NAMES:3044014::"Full","DNR","DNI","DNR/DNI","Comfort Care","Unknown"}  Prior to Admission Living Arrangement: {NAMES:3044014::"Home, living ***","SNF, ***","Homeless","***"} Anticipated Discharge Location: {NAMES:3044014::"Home","SNF","CIR","***"} Barriers to Discharge: ***  Dispo: Admit patient to Observation with expected length of stay less than 2 midnights.  Signed: Lovie Macadamia, MD Internal Medicine Resident PGY-1  11/30/2023, 11:49 PM

## 2023-11-30 NOTE — Hospital Course (Addendum)
CHF non adherent.  Does not take her medications. She was in here in October and she has not been taking her medications   Only takes birlintta 90 mg BI, aspirin 81mg  and coreg.   Impacted but she is not Nothing has come out Bilaterally pleural effusions   Patient notes that when she has to move her bowels she reports that she is getting short of breath. She states at home she has been having trouble laying flat since May 2024. She used 5 pillows at night. She notes that she gets winded when she walks. She notes that she feels that she has lost lots of weight. She reports that her jeans are too heavy. No longer able to keep up with her daughter and granddaughter when she is out. She feels that her pocketbook is too heavy for her. She states that her fatigue is getting worse. She states that when she gets winded she puts a fan to her face. She denies any fevers, night sweats, or chills. He appetite has been poor since her mastectomy. She notes that she had headaches from one of her heart pills and another medication caused her to have tingling in her legs. Left worse than right swelling. She notes that she has been taking the miralax and it has been helping her. States swelling started on the 27th. She states that she has been having issues with swallowing liquids and solids since the last time she has been out of the hospital. No urinary symptoms. She states that she has some clear phlegm that has been getting worse over the past few days.   Medications: Aspirin 81 mg daily Atorvastatin 20 mg daily- not taking  Carvedilol 6.25 mg twice daily Dorzolamide 1 drop twice daily Latanoprost 1 drop nightly in both eyes Mirtazapine 7.5 mg nightly- not taking  Multivitamin  Spironolactone 12.5 mg daily- not taking  Brilinta 90 mg twice daily Timolol 1 drop both eyes daily  Social:  Lives with: Lives at home with Daughter  Occupation: Retired from [employer] at age of 47 Level of Function:  mostly independent in ADLs, requires assistance PCP: Dr Rana Snare Substances: 1 cigarette 2 puffs here and there but has been smoking for many years, no alcohol, or illicit substance use    Family History: noncontributory  11/30/23 82 lbs  05/08/23 94 lbs  04/08/23 95 lbs 04/12/22 105 lb 12.8 oz (48 kg)  12/06/21 106 lb (48.1 kg)  06/28/21 99 lb 12.8 oz (45.3 kg)     Please notify patient of results: Echo shows pumping function of the heart is still reduced at 30-35%. She still has a moderate pericardial effusion (fluid around her heart) - this may be slightly larger than it was on last Echo in 09/2023 however no significant change. No evidence of cardiac tamponade (pressure on the heart which impairs the hearts ability to pump enough blood) which is good. Echo also shows some leaking of multiple valves (aortic, mitral , and tricuspid valves) - there is nothing that we need to do about this now and is just something that we will continue to periodically monitor in the future.   No new recommendations at this time. Patient should let us know if she has any worsening shortness of breath or if she develops any new symptoms such as chest pain, heart racing, or lightheadedness/ dizziness  11/26/2022:   1st Mrg lesion is 50% stenosed.   Mid RCA lesion is 75% stenosed.  A drug-eluting stent was successfully  placed using a SYNERGY XD 3.0X20, postdilated to greater than 3.5 mm.   Post intervention, there is a 0% residual stenosis.   There is moderate left ventricular systolic dysfunction.   LV end diastolic pressure is moderately elevated.   The left ventricular ejection fraction is 35-45% by visual estimate.   There is no aortic valve stenosis.   Right ulnar artery looks slightly larger.  There was some tortuosity and a branch vessel which the wire selected initially.  With a JR 4 catheter versa core wire or was was directed more meat daily and went into the radial artery.   Aortic saturation  96%, PA saturation 70%, PA pressure 29/12, mean PA pressure 20 mmHg, mean pulmonary capillary wedge pressure 5 mmHg, mean right atrial pressure 1 mmHg, cardiac output 5.02 L/min, cardiac index 3.58.   Normal right heart pressures.  5 mm x 3.1 mm x 4 mm right MCA trunk saccular aneurysm., 2.4 mm x 2.5 mm saccular aneurysm at the origin of the inferior division of the right MCA, 2.7 mm x 2.6 mm saccular aneurysm arising from the posterior wall of the left ICA paraclinoid region. These were not treated at this time as patient may need to gain weight. Will need to follow with Dr. Corliss Skains in 1-2 months as outpatient to discuss treatment for this.

## 2023-11-30 NOTE — H&P (Incomplete)
Date: 12/01/2023               Patient Name:  Amy Howell MRN: [id number]  DOB: 1955-12-11 Age / Sex: 67 y.o., female   PCP: Rana Snare, DO         Medical Service: Internal Medicine Teaching Service         Attending Physician: Dr. Reymundo Poll, MD      First Contact: Dr. Manuela Neptune, MD Pager [phone]    Second Contact: Dr. Marrianne Mood, MD Pager [phone]         After Hours (After 5p/  First Contact Pager: [phone]  weekends / holidays): Second Contact Pager: [phone]   SUBJECTIVE   Chief Complaint: Subjective shortness of breath,   History of Present Illness:   This is a 67 year old patient with pertinent past medical history of HFrEF, anemia, hypertension, prior CVA, saccular aneurysms, invasive lobular carcinoma of the breast status post resection, CKD 3A, and poor appetite/weight loss presenting with progressive exertional dyspnea, fatigue, and swelling of the lower extremities.  Patient states that they have had increased orthopnea (5 pillows), exertional dyspnea, fatigue and weight loss since her last hospital discharge in October.  They state that they have not been taking many other medications due to side effects which they have experienced.  They are currently taking aspirin, Brilinta, and carvedilol.  They are not taking atorvastatin, mirtazapine, spironolactone.  They have noticed increased leg swelling over the last 2 days which prompted their presentation to the emergency department.  They also state that they have had dysphagia to solids and liquids since hospital discharge.  They state that they cough up white phlegm, after drinking hot waters or teas.  They get short of breath when straining on the toilet, and endorse hard pellet-like stools as well as mucus in their stools.  Patient states that at one point they felt the need to pass flatus and were incontinent of mucus.  Patient endorses orthopnea, lower extremity edema, weight loss.   Denies melena, hematemesis, hematuria, abdominal pain, or headache.  Medications: Aspirin 81 mg daily Atorvastatin 20 mg daily- not taking  Carvedilol 6.25 mg twice daily Dorzolamide 1 drop twice daily Latanoprost 1 drop nightly in both eyes Mirtazapine 7.5 mg nightly- not taking  Multivitamin  Spironolactone 12.5 mg daily- not taking  Brilinta 90 mg twice daily Timolol 1 drop both eyes daily   ED Course: CBC demonstrating anemia with increased RDW. CMP demonstrating elevated creatinine 1.31, around baseline.  Decreased albumin 2.5. Troponins within normal limits and flat BNP elevated to 2300  Chest x-ray showing nonspecific interstitial prominence Abdominal x-ray negative CT abdomen pelvis with oral contrast demonstrating cardiomegaly with small pericardial effusion, small bilateral pleural effusions right greater than left, bibasilar atelectasis, diffuse colonic diverticulosis without diverticulitis, aortic atherosclerosis and emphysema. Given 20 mg IV Lasix   Meds:  No outpatient medications have been marked as taking for the 11/30/23 encounter [facility]).    Past Medical History  Past Surgical History:  Procedure Laterality Date   ABDOMINAL HYSTERECTOMY     CARDIAC DEFIBRILLATOR PLACEMENT     CORONARY STENT INTERVENTION N/A 11/26/2022   Procedure: CORONARY STENT INTERVENTION;  Surgeon: Corky Crafts, MD;  Location: [facility];  Service: Cardiovascular;  Laterality: N/A;   gum operation     Implantable loop recorder removal  02/01/2019   MDT Reveal LINQ explantation in office by JA   IR 3D INDEPENDENT WKST  05/07/2017   IR 3D INDEPENDENT WKST  09/17/2023   IR ANGIO INTRA EXTRACRAN SEL COM CAROTID INNOMINATE BILAT MOD SED  04/23/2017   IR ANGIO INTRA EXTRACRAN SEL COM CAROTID INNOMINATE BILAT MOD SED  08/06/2017   IR ANGIO INTRA EXTRACRAN SEL COM CAROTID INNOMINATE UNI R MOD SED  05/07/2017   IR ANGIO INTRA EXTRACRAN SEL INTERNAL CAROTID BILAT MOD SED   09/17/2023   IR ANGIO VERTEBRAL SEL VERTEBRAL BILAT MOD SED  04/23/2017   IR ANGIO VERTEBRAL SEL VERTEBRAL BILAT MOD SED  08/06/2017   IR ANGIOGRAM FOLLOW UP STUDY  05/07/2017   IR ANGIOGRAM SELECTIVE EACH ADDITIONAL VESSEL  05/07/2017   IR RADIOLOGIST EVAL & MGMT  04/16/2017   IR RADIOLOGIST EVAL & MGMT  05/29/2017   IR RADIOLOGIST EVAL & MGMT  09/29/2023   IR RADIOLOGIST EVAL & MGMT  11/05/2023   IR TRANSCATH/EMBOLIZ  05/07/2017   LOOP RECORDER INSERTION N/A 03/28/2017   Procedure: Loop Recorder Insertion;  Surgeon: Hillis Range, MD;  Location: [facility];  Service: Cardiovascular;  Laterality: N/A;   MASTECTOMY W/ SENTINEL NODE BIOPSY Right 06/25/2022   Procedure: RIGHT MASTECTOMY WITH AXILLARY SENTINEL LYMPH NODE BIOPSY;  Surgeon: Manus Rudd, MD;  Location: [facility];  Service: General;  Laterality: Right;   RADIOLOGY WITH ANESTHESIA N/A 05/07/2017   Procedure: EMBOLIZATION;  Surgeon: Julieanne Cotton, MD;  Location: [facility];  Service: Radiology;  Laterality: N/A;   RADIOLOGY WITH ANESTHESIA N/A 08/06/2017   Procedure: RADIOLOGY WITH ANESTHESIA STENTING;  Surgeon: Julieanne Cotton, MD;  Location: [facility];  Service: Radiology;  Laterality: N/A;   RIGHT/LEFT HEART CATH AND CORONARY ANGIOGRAPHY N/A 11/26/2022   Procedure: RIGHT/LEFT HEART CATH AND CORONARY ANGIOGRAPHY;  Surgeon: Corky Crafts, MD;  Location: [facility] INVASIVE CV LAB;  Service: Cardiovascular;  Laterality: N/A;   TEE WITHOUT CARDIOVERSION N/A 03/28/2017   Procedure: TRANSESOPHAGEAL ECHOCARDIOGRAM (TEE);  Surgeon: Lewayne Bunting, MD;  Location: [facility]OS[facility];  Service: Cardiovascular;  Laterality: N/A;    Social:  Lives with: Lives at home with Daughter  Occupation: Retired from [employer] at age of 26 Level of Function: mostly independent in ADLs, requires assistance PCP: Dr Rana Snare Substances: 1 cigarette 2 puffs here and there but has been smoking for many years, no alcohol, or illicit  substance use    Allergies: Allergies as of 11/30/2023 - Review Complete 11/30/2023  Allergen Reaction Noted   Bidil [isosorb dinitrate-hydralazine] Other (See Comments) 09/24/2023   Lisinopril Other (See Comments) 03/23/2020   Pork-derived products Hives 11/26/2022   Rosuvastatin Other (See Comments) 03/17/2023    Review of Systems: A complete ROS was negative except as per HPI.   OBJECTIVE:   Physical Exam: Blood pressure (!) 177/94, pulse 95, temperature 98 F (36.7 C), temperature source Oral, resp. rate 17, height 5' (1.524 m), weight 37.2 kg, SpO2 98%.   Constitutional: Chronically ill-appearing in no acute distress. Neck: No lymphadenopathy appreciated Cardiovascular: Tachycardic rate and rhythm, no m/r/g Pulmonary/Chest: normal work of breathing on room air, bibasilar crackles no wheezing appreciated.   Abdominal: soft, non-tender, non-distended Neurological: alert & oriented x 3, 5/5 strength in bilateral upper and lower extremities.  Cranial nerves grossly intact Extremities: 1+ pitting edema to midshin bilaterally.  Labs: CBC    Component Value Date/Time   WBC 4.5 11/30/2023 1232   RBC 3.63 (L) 11/30/2023 1232   HGB 10.7 (L) 11/30/2023 1232   HGB 12.1 06/12/2022 0807   HGB 12.4 06/28/2021 1525   HCT 34.5 (L) 11/30/2023 1232  HCT 37.8 06/28/2021 1525   PLT 173 11/30/2023 1232   PLT 169 06/12/2022 0807   PLT 201 06/28/2021 1525   MCV 95.0 11/30/2023 1232   MCV 91 06/28/2021 1525   MCH 29.5 11/30/2023 1232   MCHC 31.0 11/30/2023 1232   RDW 17.2 (H) 11/30/2023 1232   RDW 12.5 06/28/2021 1525   LYMPHSABS 1.0 11/30/2023 1232   LYMPHSABS 1.7 06/28/2021 1525   MONOABS 0.4 11/30/2023 1232   EOSABS 0.1 11/30/2023 1232   EOSABS 0.2 06/28/2021 1525   BASOSABS 0.0 11/30/2023 1232   BASOSABS 0.0 06/28/2021 1525     CMP     Component Value Date/Time   NA 137 11/30/2023 1232   NA 139 05/08/2023 1438   K 3.7 11/30/2023 1232   CL 109 11/30/2023 1232   CO2 21  (L) 11/30/2023 1232   GLUCOSE 103 (H) 11/30/2023 1232   BUN 24 (H) 11/30/2023 1232   BUN 29 (H) 05/08/2023 1438   CREATININE 1.31 (H) 11/30/2023 1232   CREATININE 1.21 (H) 06/12/2022 0807   CALCIUM 8.3 (L) 11/30/2023 1232   PROT 5.7 (L) 11/30/2023 1232   PROT 7.3 06/30/2023 0805   ALBUMIN 2.5 (L) 11/30/2023 1232   ALBUMIN 4.1 06/30/2023 0805   AST 41 11/30/2023 1232   AST 17 06/12/2022 0807   ALT 45 (H) 11/30/2023 1232   ALT 15 06/12/2022 0807   ALKPHOS 117 11/30/2023 1232   BILITOT 0.6 11/30/2023 1232   BILITOT 0.2 06/30/2023 0805   BILITOT 0.4 06/12/2022 0807   GFRNONAA 44 (L) 11/30/2023 1232   GFRNONAA 49 (L) 06/12/2022 0807   GFRAA 68 07/15/2019 1103    Imaging: Chest x-ray showing nonspecific interstitial prominence Abdominal x-ray negative CT abdomen pelvis with oral contrast demonstrating cardiomegaly with small pericardial effusion, small bilateral pleural effusions right greater than left, bibasilar atelectasis, diffuse colonic diverticulosis without diverticulitis, aortic atherosclerosis and emphysema. Given 20 mg IV Lasix  EKG: personally reviewed my interpretation is left ventricular hypertrophy. Prior EKG similar  ASSESSMENT & PLAN:   Assessment & Plan by Problem: Principal Problem:   Acute exacerbation of CHF (congestive heart failure) (HCC) Active Problems:   Hypertension   Glaucoma of both eyes   Hyperlipidemia   Constipation   History of CVA (cerebrovascular accident)   Failure to thrive in adult   Hermione Njoroge is a 67 y.o. patient with pertinent past medical history of HFrEF, anemia, hypertension, prior CVA, saccular aneurysms, invasive lobular carcinoma of the breast status post resection, CKD 3A, and poor appetite/weight loss presenting with progressive exertional dyspnea, fatigue, and swelling of the lower extremities, admitted for CHF exacerbation and failure to thrive on hospital day 0  #HFrEF exacerbation #Pericardial effusion #Pleural  effusions #Hypertension #hx of coronary stenting Discontinued much of her GDMT regiment due to side effects per patient.  Prescribed spironolactone 12.5 daily, Coreg 6.25 twice daily.  Declined rest of GDMT at cardiology appointment.  Status post 20 mg IV Lasix in the ED, patient says she is making a lot of urine following this treatment.  Status post successful PCI to the mid RCA in December 2023.  Prescribed statin (Lipitor 40), which she is not taking at this time.  She is taking twice daily Brilinta as well as daily baby aspirin as well as Coreg 6.25mg  BID. Plan:  - S/p 20mg  IV lasix - Reasses volume status in AM - Strict ins and outs - Daily weights  #History of CVA #History of saccular aneurysms At time of last  discharge was noted that she had 5 mm x 3.1 mm x 4 mm right MCA trunk saccular aneurysm., 2.4 mm x 2.5 mm saccular aneurysm at the origin of the inferior division of the right MCA, 2.7 mm x 2.6 mm saccular aneurysm arising from the posterior wall of the left ICA paraclinoid region. These were not treated at hospitalization as patient may need to gain weight. Was to follow with Dr. Corliss Skains in 1-2 months as outpatient to discuss treatment for this.  Plan:  - BP control - Encourage Neuro follow up.   #CKD 3A Creatinine at baseline, GFR may be overestimated in this very thin patient with elevated creatinine. Plan:  - Daily RFP with diuresis   #Hypoalbuminemia #Poor appetite #Underweight Was prescribed mirtazapine for appetite, but stopped taking this due to side effect. Hypoalbuminemia in setting of nutritional deficiency. Dry weight likely lower than current weight in setting of volume overload.  Weight loss as follows: 11/30/23 82 lbs  05/08/23 94 lbs  04/08/23 95 lbs Plan: - Daily RFP - Mag level in AM - Ensure supplement   #History of breast cancer Was prescribed anastrazole, but stopped taking this due to side effects.  Patient states this caused her a lot of  sedation. Some concern at time of last hospital discharge for ongoing malignant process, no further workup in the interim. Plan: - May benefit from additional outpatient oncology workup. - No obvious signs of metastasis on non-contrast CT study today.   #Normocytic anemia Planned colonoscopy in February.  Plan: - Iron panel + Ferritin - Folate and B12  #Pulmonary nodule Prior right solid pulmonary nodule within the upper lobe measuring 5 mm.  May benefit from repeat CT imaging given multiple risk factors for malignancy.  #Dysphagia Endorses dysphagia to solids and liquids since last hospital discharge.  May be secondary to known stroke, or may reflect malignancy.  Speech evaluation was canceled by speech at last hospital admission.  May benefit from speech evaluation/outpatient GI workup and EGD in this patient with several risk factors for dysphagia and malignancy. Plan: - Consider speech eval - Consider outpatient GI referral.  #Constipation Endorses ongoing constipation, states she gets winded when trying to go to the bathroom and pass stools.  CT abdomen pelvis today relatively unremarkable.  She states she did get some relief bowel regiment of MiraLAX and senna, but was instructed by her daughter not to take this for extended period of time. Plan: - MiraLAX inpatient, recommended continuation outpatient.  #Emphysema On imaging, and history of white sputum production.  Denies chronic cough.  No formal PFTs Plan: - Outpatient follow-up, consider PFTs and inhalers.  Diet: Heart Healthy VTE: SCDs IVF: None,None Code: Full  Prior to Admission Living Arrangement: Home, living with daughter Anticipated Discharge Location: Home Barriers to Discharge: Diuresis, workup  Dispo: Admit patient to Observation with expected length of stay less than 2 midnights.  Signed: Lovie Macadamia, MD Internal Medicine Resident PGY-1  12/01/2023, 12:48 AM

## 2023-11-30 NOTE — ED Triage Notes (Signed)
Patient reports leg swelling and increased sob since 12/27.  Patient has HF but refuses to take her meds other than brillinta and coreg.  Patient reports mucous in her stools, loss of appetite and constipation.

## 2023-11-30 NOTE — Progress Notes (Signed)
VASCULAR LAB    Left lower extremity venous duplex has been performed.  See CV proc for preliminary results.  Gave verbal report to Achille Rich, PA-C  Deloria Brassfield, [facility], RVT 11/30/2023, 6:57 PM

## 2023-11-30 NOTE — ED Provider Notes (Incomplete)
A[facility] Howell EMERGENCY DEPARTMENT AT [facility] Provider Note   CSN: [id number] Arrival date & time: 11/30/23  1105     History {Add pertinent medical, surgical, social history, OB history to HPI:1} Chief Complaint  Patient presents with   Shortness of Breath   Leg Swelling    Amy Howell is a 67 y.o. female.   Shortness of Breath      Home Medications Prior to Admission medications   Medication Sig Start Date End Date Taking? Authorizing Provider  aspirin 81 MG chewable tablet Chew 1 tablet (81 mg total) by mouth daily. 11/28/22   Danford, Earl Lites, MD  atorvastatin (LIPITOR) 20 MG tablet Take 1 tablet (20 mg total) by mouth daily. 09/30/23 01/06/24  Marjie Skiff E, PA-C  carvedilol (COREG) 6.25 MG tablet Take 1 tablet (6.25 mg total) by mouth 2 (two) times daily. 11/03/23 12/05/23  Rana Snare, DO  dorzolamide (TRUSOPT) 2 % ophthalmic solution Instill 1 drop into both eyes twice a day 02/19/22     feeding supplement (ENSURE ENLIVE / ENSURE PLUS) LIQD Take 237 mLs by mouth 3 (three) times daily between meals. 09/18/23   Gwenevere Abbot, MD  latanoprost (XALATAN) 0.005 % ophthalmic solution Instill 1 drop into both eyes at bedtime 02/19/22     mirtazapine (REMERON SOL-TAB) 15 MG disintegrating tablet Dissolve 1/2 tablet (7.5 mg total) by mouth at bedtime. 10/01/23   Nooruddin, Jason Fila, MD  Multiple Vitamin (MULTIVITAMIN WITH MINERALS) TABS tablet Take 1 tablet by mouth daily. 09/19/23   Gwenevere Abbot, MD  polyethylene glycol powder [facility]) 17 GM/SCOOP powder Take 17 g by mouth daily. 11/20/23   Rana Snare, DO  senna (SENOKOT) 8.6 MG TABS tablet Take 1 tablet (8.6 mg total) by mouth daily. 11/20/23   Rana Snare, DO  spironolactone (ALDACTONE) 25 MG tablet Take 1/2 tablet (12.5 mg total) by mouth daily. 10/01/23   Nooruddin, Jason Fila, MD  ticagrelor (BRILINTA) 90 MG TABS tablet Take 1 tablet (90 mg total) by mouth 2 (two) times daily. 11/18/23   Rana Snare, DO   timolol (TIMOPTIC) 0.5 % ophthalmic solution Place 1 drop into both eyes every morning. 04/30/23         Allergies    Bidil [isosorb dinitrate-hydralazine], Lisinopril, Pork-derived products, and Rosuvastatin    Review of Systems   Review of Systems  Respiratory:  Positive for shortness of breath.     Physical Exam Updated Vital Signs BP (!) 177/94   Pulse 95   Temp 98 F (36.7 C) (Oral)   Resp 17   Ht 5' (1.524 m)   Wt 37.2 kg   SpO2 98%   BMI 16.01 kg/m  Physical Exam  ED Results / Procedures / Treatments   Labs (all labs ordered are listed, but only abnormal results are displayed) Labs Reviewed  CBC WITH DIFFERENTIAL/PLATELET - Abnormal; Notable for the following components:      Result Value   RBC 3.63 (*)    Hemoglobin 10.7 (*)    HCT 34.5 (*)    RDW 17.2 (*)    All other components within normal limits  COMPREHENSIVE METABOLIC PANEL - Abnormal; Notable for the following components:   CO2 21 (*)    Glucose, Bld 103 (*)    BUN 24 (*)    Creatinine, Ser 1.31 (*)    Calcium 8.3 (*)    Total Protein 5.7 (*)    Albumin 2.5 (*)    ALT 45 (*)    GFR, Estimated 44 (*)  All other components within normal limits  BRAIN NATRIURETIC PEPTIDE - Abnormal; Notable for the following components:   B Natriuretic Peptide 2,352.4 (*)    All other components within normal limits  TROPONIN I (HIGH SENSITIVITY)  TROPONIN I (HIGH SENSITIVITY)    EKG None  Radiology CT ABDOMEN PELVIS WO CONTRAST Result Date: 11/30/2023 CLINICAL DATA:  Increasing short of breath and lower extremity edema, mucous in stools, loss of appetite, constipation EXAM: CT ABDOMEN AND PELVIS WITHOUT CONTRAST TECHNIQUE: Multidetector CT imaging of the abdomen and pelvis was performed following the standard protocol without IV contrast. Unenhanced CT was performed per clinician order. Lack of IV contrast limits sensitivity and specificity, especially for evaluation of abdominal/pelvic solid viscera.  RADIATION DOSE REDUCTION: This exam was performed according to the departmental dose-optimization program which includes automated exposure control, adjustment of the mA and/or kV according to patient size and/or use of iterative reconstruction technique. COMPARISON:  11/30/2023, 09/12/2023 FINDINGS: Lower chest: The heart is enlarged with small pericardial effusion. There are bilateral pleural effusions, right greater than left. Emphysema, with bibasilar atelectasis. Hepatobiliary: Unremarkable unenhanced appearance of the liver and gallbladder. Pancreas: Unremarkable unenhanced appearance. Spleen: Unremarkable unenhanced appearance. Adrenals/Urinary Tract: No urinary tract calculi or obstructive uropathy. Stable proteinaceous left renal cyst and simple right renal cyst. No specific imaging follow-up is required. The adrenals and bladder are unremarkable. Stomach/Bowel: No bowel obstruction or ileus. Normal retrocecal appendix. Diffuse colonic diverticulosis without evidence of acute diverticulitis. No bowel wall thickening or inflammatory change. Minimal retained stool within the proximal colon. Vascular/Lymphatic: Aortic atherosclerosis. No enlarged abdominal or pelvic lymph nodes. Reproductive: Status post hysterectomy. No adnexal masses. Other: No free fluid or free intraperitoneal gas. No abdominal wall hernia. Musculoskeletal: No acute or destructive bony abnormalities. Reconstructed images demonstrate no additional findings. IMPRESSION: 1. Cardiomegaly with small pericardial effusion. 2. Small bilateral pleural effusions, right greater than left, with bibasilar atelectasis. 3. Diffuse colonic diverticulosis without diverticulitis. 4. No bowel obstruction or ileus. Minimal retained stool within the proximal colon. 5. Aortic Atherosclerosis (ICD10-I70.0) and Emphysema (ICD10-J43.9). Electronically Signed   By: Sharlet Salina M.D.   On: 11/30/2023 20:59   VAS [place] LOWER EXTREMITY VENOUS (DVT) (ONLY [facility] &  [facility]) Result Date: 11/30/2023  Lower Venous DVT Study Patient Name:  Amy Howell  Date of Exam:   11/30/2023 Medical Rec #: [id number]      Accession #:    [id number] Date of Birth: 1955-12-10       Patient Gender: F Patient Age:   42 years Exam Location:  [facility] Procedure:      VAS [place] LOWER EXTREMITY VENOUS (DVT) Referring Phys: Jordon Kristiansen --------------------------------------------------------------------------------  Indications: Increased leg swelling and SOB since 11/28/23. Patient with heart failure but medicinal non compliance.  Comparison Study: No prior study on file Performing Technologist: Sherren Kerns RVS  Examination Guidelines: A complete evaluation includes B-mode imaging, spectral Doppler, color Doppler, and power Doppler as needed of all accessible portions of each vessel. Bilateral testing is considered an integral part of a complete examination. Limited examinations for reoccurring indications may be performed as noted. The reflux portion of the exam is performed with the patient in reverse Trendelenburg.  +-----+---------------+---------+-----------+------------------+--------------+ RIGHTCompressibilityPhasicitySpontaneityProperties        Thrombus Aging +-----+---------------+---------+-----------+------------------+--------------+ CFV  Full           Yes      No         pulsatile waveform               +-----+---------------+---------+-----------+------------------+--------------+   +--------+---------------+---------+-----------+----------------+-------------+  LEFT    CompressibilityPhasicitySpontaneityProperties      Thrombus                                                                 Aging         +--------+---------------+---------+-----------+----------------+-------------+ CFV     Full           Yes      No         pulsatile                                                                waveform                       +--------+---------------+---------+-----------+----------------+-------------+ SFJ     Full                                                             +--------+---------------+---------+-----------+----------------+-------------+ FV Prox Full                                                             +--------+---------------+---------+-----------+----------------+-------------+ FV Mid  Full                                                             +--------+---------------+---------+-----------+----------------+-------------+ FV      Full                                                             Distal                                                                   +--------+---------------+---------+-----------+----------------+-------------+ PFV     Full                                                             +--------+---------------+---------+-----------+----------------+-------------+  POP     Full           Yes      No         pulsatile                                                                waveform                      +--------+---------------+---------+-----------+----------------+-------------+ PTV     Full                                                             +--------+---------------+---------+-----------+----------------+-------------+ PERO    Full                                                             +--------+---------------+---------+-----------+----------------+-------------+    Summary: RIGHT: - No evidence of common femoral vein obstruction.  pulsatile waveforms consistent with fluid overload  LEFT: - There is no evidence of deep vein thrombosis in the lower extremity.  - No cystic structure found in the popliteal fossa. Pulsatile waveforms consistent with fluid overload.  *See table(s) above for measurements and observations.    Preliminary    DG Abdomen 1 View Result Date:  11/30/2023 CLINICAL DATA:  Constipation. EXAM: ABDOMEN - 1 VIEW COMPARISON:  None Available. FINDINGS: The bowel gas pattern is normal. No radio-opaque calculi or other significant radiographic abnormality are seen. IMPRESSION: Negative. Electronically Signed   By: Layla Maw M.D.   On: 11/30/2023 13:23   DG Chest 2 View Result Date: 11/30/2023 CLINICAL DATA:  sob EXAM: CHEST - 2 VIEW COMPARISON:  09/12/2023. FINDINGS: Cardiac silhouette is prominent. There is diffuse pulmonary interstitial prominence which could be seen with atypical infection, edema or interstitial lung disease. There is no focal consolidation. No pneumothorax. Small right-sided pleural effusion. Aorta is calcified. The visualized osseous structures are unremarkable. IMPRESSION: Nonspecific interstitial prominence consistent with atypical infection, edema or interstitial lung disease. No focal consolidation. Electronically Signed   By: Layla Maw M.D.   On: 11/30/2023 13:22    Procedures Procedures  {Document cardiac monitor, telemetry assessment procedure when appropriate:1}  Medications Ordered in ED Medications  furosemide (LASIX) injection 20 mg (has no administration in time range)  iohexol (OMNIPAQUE) 300 MG/ML solution 30 mL (30 mLs Oral Contrast Given 11/30/23 1612)    ED Course/ Medical Decision Making/ A&P   {   Click here for ABCD2, HEART and other calculatorsREFRESH Note before signing :1}                              Medical Decision Making Amount and/or Complexity of Data Reviewed Labs: ordered. Radiology: ordered.  Risk Prescription drug management. Decision regarding hospitalization.   ***  {Document critical care time when appropriate:1} {Document review  of labs and clinical decision tools ie heart score, Chads2Vasc2 etc:1}  {Document your independent review of radiology images, and any outside records:1} {Document your discussion with family members, caretakers, and with  consultants:1} {Document social determinants of health affecting pt's care:1} {Document your decision making why or why not admission, treatments were needed:1} Final Clinical Impression(s) / ED Diagnoses Final diagnoses:  None    Rx / DC Orders ED Discharge Orders     None

## 2023-11-30 NOTE — ED Provider Notes (Incomplete)
[place] EMERGENCY DEPARTMENT AT [facility] Provider Note   CSN: [id number] Arrival date & time: 11/30/23  1105     History Chief Complaint  Patient presents with   Shortness of Breath   Leg Swelling    Amy Howell is a 67 y.o. female with history of hypertension, hyperlipidemia, CHF, presents to the emergency department today for evaluation of shortness of breath.  Patient reports has been worsening since October.  She reports that now she is been feeling short of breath with small activity such as walking to the bathroom.  She also reports that she is not feeling short of breath with bowel movements.  She denies any chest pain.  Reports that she has noticed some bilateral leg swelling for the past 2 to 3 days.  She reports that she feels like her left leg is more swollen than her right.  She also reports that she feels like she is constipated.  She reports she is having mucousy stool or very small bowel movements but is able to pass gas.  Denies any urinary symptoms.  She reports that she was seen by her primary care doctor and was given medications and had bowel movements however still feels like she is constipated. Patient is noncompliant with her medications.  She takes her Brilinta and Coreg.  She is reports that she is afraid of medication side effects and does not take the others.  She reports that she is not supposed to be on any diuretic per her knowledge.    Shortness of Breath Associated symptoms: no abdominal pain, no chest pain, no cough, no fever and no vomiting        Home Medications Prior to Admission medications   Medication Sig Start Date End Date Taking? Authorizing Provider  aspirin 81 MG chewable tablet Chew 1 tablet (81 mg total) by mouth daily. 11/28/22   Danford, Earl Lites, MD  atorvastatin (LIPITOR) 20 MG tablet Take 1 tablet (20 mg total) by mouth daily. 09/30/23 01/06/24  Marjie Skiff E, PA-C  carvedilol (COREG) 6.25 MG tablet Take 1  tablet (6.25 mg total) by mouth 2 (two) times daily. 11/03/23 12/05/23  Rana Snare, DO  dorzolamide (TRUSOPT) 2 % ophthalmic solution Instill 1 drop into both eyes twice a day 02/19/22     feeding supplement (ENSURE ENLIVE / ENSURE PLUS) LIQD Take 237 mLs by mouth 3 (three) times daily between meals. 09/18/23   Gwenevere Abbot, MD  latanoprost (XALATAN) 0.005 % ophthalmic solution Instill 1 drop into both eyes at bedtime 02/19/22     mirtazapine (REMERON SOL-TAB) 15 MG disintegrating tablet Dissolve 1/2 tablet (7.5 mg total) by mouth at bedtime. 10/01/23   Nooruddin, Jason Fila, MD  Multiple Vitamin (MULTIVITAMIN WITH MINERALS) TABS tablet Take 1 tablet by mouth daily. 09/19/23   Gwenevere Abbot, MD  polyethylene glycol powder [facility]) 17 GM/SCOOP powder Take 17 g by mouth daily. 11/20/23   Rana Snare, DO  senna (SENOKOT) 8.6 MG TABS tablet Take 1 tablet (8.6 mg total) by mouth daily. 11/20/23   Rana Snare, DO  spironolactone (ALDACTONE) 25 MG tablet Take 1/2 tablet (12.5 mg total) by mouth daily. 10/01/23   Nooruddin, Jason Fila, MD  ticagrelor (BRILINTA) 90 MG TABS tablet Take 1 tablet (90 mg total) by mouth 2 (two) times daily. 11/18/23   Rana Snare, DO  timolol (TIMOPTIC) 0.5 % ophthalmic solution Place 1 drop into both eyes every morning. 04/30/23         Allergies  Bidil [isosorb dinitrate-hydralazine], Lisinopril, Pork-derived products, and Rosuvastatin    Review of Systems   Review of Systems  Constitutional:  Negative for chills and fever.  HENT:  Negative for congestion and rhinorrhea.   Respiratory:  Positive for shortness of breath. Negative for cough.   Cardiovascular:  Positive for leg swelling. Negative for chest pain and palpitations.  Gastrointestinal:  Positive for constipation. Negative for abdominal pain, diarrhea, nausea and vomiting.  Genitourinary:  Negative for dysuria and hematuria.    Physical Exam Updated Vital Signs BP (!) 177/94   Pulse 95   Temp 98 F (36.7 C)  (Oral)   Resp 17   Ht 5' (1.524 m)   Wt 37.2 kg   SpO2 98%   BMI 16.01 kg/m  Physical Exam Vitals and nursing note reviewed.  Constitutional:      General: She is not in acute distress.    Appearance: She is not toxic-appearing.  HENT:     Mouth/Throat:     Mouth: Mucous membranes are moist.  Cardiovascular:     Rate and Rhythm: Normal rate.  Pulmonary:     Effort: Pulmonary effort is normal. No tachypnea or respiratory distress.     Comments: Diminished breath sounds in the bilateral bases.  Patient able to speak in full sentences.  Satting well at room air. Musculoskeletal:     Right lower leg: Edema present.     Left lower leg: Edema present.     Comments: 1+ pitting edema to bilateral lower extremities up above the mid calf.  Palpable pulses.  Left leg is slightly bigger than the right.  Compartments pliable.  Skin:    General: Skin is warm and dry.  Neurological:     Mental Status: She is alert.     ED Results / Procedures / Treatments   Labs (all labs ordered are listed, but only abnormal results are displayed) Labs Reviewed  CBC WITH DIFFERENTIAL/PLATELET - Abnormal; Notable for the following components:      Result Value   RBC 3.63 (*)    Hemoglobin 10.7 (*)    HCT 34.5 (*)    RDW 17.2 (*)    All other components within normal limits  COMPREHENSIVE METABOLIC PANEL - Abnormal; Notable for the following components:   CO2 21 (*)    Glucose, Bld 103 (*)    BUN 24 (*)    Creatinine, Ser 1.31 (*)    Calcium 8.3 (*)    Total Protein 5.7 (*)    Albumin 2.5 (*)    ALT 45 (*)    GFR, Estimated 44 (*)    All other components within normal limits  BRAIN NATRIURETIC PEPTIDE - Abnormal; Notable for the following components:   B Natriuretic Peptide 2,352.4 (*)    All other components within normal limits  TROPONIN I (HIGH SENSITIVITY)  TROPONIN I (HIGH SENSITIVITY)    EKG None  Radiology CT ABDOMEN PELVIS WO CONTRAST Result Date: 11/30/2023 CLINICAL DATA:   Increasing short of breath and lower extremity edema, mucous in stools, loss of appetite, constipation EXAM: CT ABDOMEN AND PELVIS WITHOUT CONTRAST TECHNIQUE: Multidetector CT imaging of the abdomen and pelvis was performed following the standard protocol without IV contrast. Unenhanced CT was performed per clinician order. Lack of IV contrast limits sensitivity and specificity, especially for evaluation of abdominal/pelvic solid viscera. RADIATION DOSE REDUCTION: This exam was performed according to the departmental dose-optimization program which includes automated exposure control, adjustment of the mA and/or kV according to  patient size and/or use of iterative reconstruction technique. COMPARISON:  11/30/2023, 09/12/2023 FINDINGS: Lower chest: The heart is enlarged with small pericardial effusion. There are bilateral pleural effusions, right greater than left. Emphysema, with bibasilar atelectasis. Hepatobiliary: Unremarkable unenhanced appearance of the liver and gallbladder. Pancreas: Unremarkable unenhanced appearance. Spleen: Unremarkable unenhanced appearance. Adrenals/Urinary Tract: No urinary tract calculi or obstructive uropathy. Stable proteinaceous left renal cyst and simple right renal cyst. No specific imaging follow-up is required. The adrenals and bladder are unremarkable. Stomach/Bowel: No bowel obstruction or ileus. Normal retrocecal appendix. Diffuse colonic diverticulosis without evidence of acute diverticulitis. No bowel wall thickening or inflammatory change. Minimal retained stool within the proximal colon. Vascular/Lymphatic: Aortic atherosclerosis. No enlarged abdominal or pelvic lymph nodes. Reproductive: Status post hysterectomy. No adnexal masses. Other: No free fluid or free intraperitoneal gas. No abdominal wall hernia. Musculoskeletal: No acute or destructive bony abnormalities. Reconstructed images demonstrate no additional findings. IMPRESSION: 1. Cardiomegaly with small  pericardial effusion. 2. Small bilateral pleural effusions, right greater than left, with bibasilar atelectasis. 3. Diffuse colonic diverticulosis without diverticulitis. 4. No bowel obstruction or ileus. Minimal retained stool within the proximal colon. 5. Aortic Atherosclerosis (ICD10-I70.0) and Emphysema (ICD10-J43.9). Electronically Signed   By: Sharlet Salina M.D.   On: 11/30/2023 20:59   VAS [place] LOWER EXTREMITY VENOUS (DVT) (ONLY [facility] & [facility]) Result Date: 11/30/2023  Lower Venous DVT Study Patient Name:  GRACESON STECKLEIN  Date of Exam:   11/30/2023 Medical Rec #: [id number]      Accession #:    [id number] Date of Birth: Jan 01, 1956       Patient Gender: F Patient Age:   16 years Exam Location:  [facility] Procedure:      VAS [place] LOWER EXTREMITY VENOUS (DVT) Referring Phys: Joanny Dupree --------------------------------------------------------------------------------  Indications: Increased leg swelling and SOB since 11/28/23. Patient with heart failure but medicinal non compliance.  Comparison Study: No prior study on file Performing Technologist: Sherren Kerns RVS  Examination Guidelines: A complete evaluation includes B-mode imaging, spectral Doppler, color Doppler, and power Doppler as needed of all accessible portions of each vessel. Bilateral testing is considered an integral part of a complete examination. Limited examinations for reoccurring indications may be performed as noted. The reflux portion of the exam is performed with the patient in reverse Trendelenburg.  +-----+---------------+---------+-----------+------------------+--------------+ RIGHTCompressibilityPhasicitySpontaneityProperties        Thrombus Aging +-----+---------------+---------+-----------+------------------+--------------+ CFV  Full           Yes      No         pulsatile waveform               +-----+---------------+---------+-----------+------------------+--------------+    +--------+---------------+---------+-----------+----------------+-------------+ LEFT    CompressibilityPhasicitySpontaneityProperties      Thrombus                                                                 Aging         +--------+---------------+---------+-----------+----------------+-------------+ CFV     Full           Yes      No         pulsatile  waveform                      +--------+---------------+---------+-----------+----------------+-------------+ SFJ     Full                                                             +--------+---------------+---------+-----------+----------------+-------------+ FV Prox Full                                                             +--------+---------------+---------+-----------+----------------+-------------+ FV Mid  Full                                                             +--------+---------------+---------+-----------+----------------+-------------+ FV      Full                                                             Distal                                                                   +--------+---------------+---------+-----------+----------------+-------------+ PFV     Full                                                             +--------+---------------+---------+-----------+----------------+-------------+ POP     Full           Yes      No         pulsatile                                                                waveform                      +--------+---------------+---------+-----------+----------------+-------------+ PTV     Full                                                             +--------+---------------+---------+-----------+----------------+-------------+  PERO    Full                                                              +--------+---------------+---------+-----------+----------------+-------------+    Summary: RIGHT: - No evidence of common femoral vein obstruction.  pulsatile waveforms consistent with fluid overload  LEFT: - There is no evidence of deep vein thrombosis in the lower extremity.  - No cystic structure found in the popliteal fossa. Pulsatile waveforms consistent with fluid overload.  *See table(s) above for measurements and observations.    Preliminary    DG Abdomen 1 View Result Date: 11/30/2023 CLINICAL DATA:  Constipation. EXAM: ABDOMEN - 1 VIEW COMPARISON:  None Available. FINDINGS: The bowel gas pattern is normal. No radio-opaque calculi or other significant radiographic abnormality are seen. IMPRESSION: Negative. Electronically Signed   By: Layla Maw M.D.   On: 11/30/2023 13:23   DG Chest 2 View Result Date: 11/30/2023 CLINICAL DATA:  sob EXAM: CHEST - 2 VIEW COMPARISON:  09/12/2023. FINDINGS: Cardiac silhouette is prominent. There is diffuse pulmonary interstitial prominence which could be seen with atypical infection, edema or interstitial lung disease. There is no focal consolidation. No pneumothorax. Small right-sided pleural effusion. Aorta is calcified. The visualized osseous structures are unremarkable. IMPRESSION: Nonspecific interstitial prominence consistent with atypical infection, edema or interstitial lung disease. No focal consolidation. Electronically Signed   By: Layla Maw M.D.   On: 11/30/2023 13:22    Procedures Procedures   Medications Ordered in ED Medications  furosemide (LASIX) injection 20 mg (20 mg Intravenous Given 11/30/23 2135)  iohexol (OMNIPAQUE) 300 MG/ML solution 30 mL (30 mLs Oral Contrast Given 11/30/23 1612)    ED Course/ Medical Decision Making/ A&P    Medical Decision Making Amount and/or Complexity of Data Reviewed Labs: ordered. Radiology: ordered.  Risk Prescription drug management. Decision regarding hospitalization.   67 y.o.  female presents to the ER for evaluation of bilateral leg swelling, shortness of breath, constipation. Differential diagnosis includes but is not limited to constipation, small bowel obstruction, fecal impaction, CHF exacerbation, CHF, pericardial effusion/tamponade, arrhythmias, ACS, COPD, asthma, bronchitis, pneumonia, pneumothorax, PE, anemia. Vital signs elevated blood pressure, otherwise unremarkable. Physical exam as noted above.   I independently reviewed and interpreted the patient's labs.  Protein at 5.  CMP shows mild decrease bicarb 21, glucose at 103, BUN 24, creatinine 1.31 which peers around baseline.  Decrease in calcium, total protein, and albumin.  Mildly increased ALT at 45 otherwise no other electrolyte or LFT abnormality.  BNP is slightly elevated at 2352.4.  CBC shows mild anemia 10.7.  No leukocytosis.  Anemia appears to be around baseline from previous labs.  CXR Nonspecific interstitial prominence consistent with atypical infection, edema or interstitial lung disease. No focal consolidation.  DG abdomen negative. Per radiologist's interpretation.    DVT study pulsatile waveforms consistent with fluid overload.  Negative for DVT.   CT ABD 1. Cardiomegaly with small pericardial effusion. 2. Small bilateral pleural effusions, right greater than left, with bibasilar atelectasis. 3. Diffuse colonic diverticulosis without diverticulitis. 4. No bowel obstruction or ileus. Minimal retained stool within the proximal colon. 5. Aortic Atherosclerosis. Per radiologist's interpretation.    I have ordered the patient 20 mg of IV Lasix.  From previous chart evaluation, it appears the patient's EF  is 30 to 35%.  She is likely having some decompensation to her heart failure.  I see that she is dyspnea on spironolactone however does not take it.  I do not see any other diuretic mentioned.  Given her pericardial effusion, bilateral pleural effusions, and pitting edema, I do feel the patient needs to  be admitted for IV diuresis.  Regarding her abdominal/constipation.  I think the patient is having decrease in eating which is leading to decrease in fecal output.  She reports that she is having mucousy schools but cannot get a colonoscopy until February but she is already scheduled for one.  Can follow-up outpatient about the questionable constipation.  Admitted to internal medicine teaching service.  Portions of this report may have been transcribed using voice recognition software. Every effort was made to ensure accuracy; however, inadvertent computerized transcription errors may be present.   Final Clinical Impression(s) / ED Diagnoses Final diagnoses:  Acute on chronic congestive heart failure, unspecified heart failure type (HCC)  Constipation, unspecified constipation type    Rx / DC Orders ED Discharge Orders     None         Achille Rich, [place] 12/01/23 0021

## 2023-12-01 ENCOUNTER — Observation Stay (HOSPITAL_COMMUNITY): Payer: Medicare (Managed Care)

## 2023-12-01 ENCOUNTER — Other Ambulatory Visit: Payer: Self-pay

## 2023-12-01 DIAGNOSIS — D509 Iron deficiency anemia, unspecified: Secondary | ICD-10-CM | POA: Diagnosis present

## 2023-12-01 DIAGNOSIS — Z9011 Acquired absence of right breast and nipple: Secondary | ICD-10-CM | POA: Diagnosis not present

## 2023-12-01 DIAGNOSIS — R1319 Other dysphagia: Secondary | ICD-10-CM

## 2023-12-01 DIAGNOSIS — E8809 Other disorders of plasma-protein metabolism, not elsewhere classified: Secondary | ICD-10-CM | POA: Diagnosis present

## 2023-12-01 DIAGNOSIS — F17210 Nicotine dependence, cigarettes, uncomplicated: Secondary | ICD-10-CM | POA: Diagnosis present

## 2023-12-01 DIAGNOSIS — I3139 Other pericardial effusion (noninflammatory): Secondary | ICD-10-CM | POA: Diagnosis present

## 2023-12-01 DIAGNOSIS — N1831 Chronic kidney disease, stage 3a: Secondary | ICD-10-CM | POA: Diagnosis present

## 2023-12-01 DIAGNOSIS — E785 Hyperlipidemia, unspecified: Secondary | ICD-10-CM | POA: Diagnosis present

## 2023-12-01 DIAGNOSIS — I130 Hypertensive heart and chronic kidney disease with heart failure and stage 1 through stage 4 chronic kidney disease, or unspecified chronic kidney disease: Secondary | ICD-10-CM | POA: Diagnosis present

## 2023-12-01 DIAGNOSIS — Z95828 Presence of other vascular implants and grafts: Secondary | ICD-10-CM | POA: Diagnosis not present

## 2023-12-01 DIAGNOSIS — Z888 Allergy status to other drugs, medicaments and biological substances status: Secondary | ICD-10-CM | POA: Diagnosis not present

## 2023-12-01 DIAGNOSIS — R627 Adult failure to thrive: Secondary | ICD-10-CM | POA: Diagnosis present

## 2023-12-01 DIAGNOSIS — H409 Unspecified glaucoma: Secondary | ICD-10-CM | POA: Diagnosis present

## 2023-12-01 DIAGNOSIS — Z8673 Personal history of transient ischemic attack (TIA), and cerebral infarction without residual deficits: Secondary | ICD-10-CM | POA: Diagnosis not present

## 2023-12-01 DIAGNOSIS — K5900 Constipation, unspecified: Secondary | ICD-10-CM

## 2023-12-01 DIAGNOSIS — C50919 Malignant neoplasm of unspecified site of unspecified female breast: Secondary | ICD-10-CM | POA: Diagnosis present

## 2023-12-01 DIAGNOSIS — R1314 Dysphagia, pharyngoesophageal phase: Secondary | ICD-10-CM

## 2023-12-01 DIAGNOSIS — Z860100 Personal history of colon polyps, unspecified: Secondary | ICD-10-CM

## 2023-12-01 DIAGNOSIS — E1122 Type 2 diabetes mellitus with diabetic chronic kidney disease: Secondary | ICD-10-CM | POA: Diagnosis present

## 2023-12-01 DIAGNOSIS — I5023 Acute on chronic systolic (congestive) heart failure: Secondary | ICD-10-CM | POA: Diagnosis present

## 2023-12-01 DIAGNOSIS — R64 Cachexia: Secondary | ICD-10-CM | POA: Diagnosis present

## 2023-12-01 DIAGNOSIS — R1310 Dysphagia, unspecified: Secondary | ICD-10-CM

## 2023-12-01 DIAGNOSIS — I509 Heart failure, unspecified: Secondary | ICD-10-CM

## 2023-12-01 DIAGNOSIS — Z7902 Long term (current) use of antithrombotics/antiplatelets: Secondary | ICD-10-CM | POA: Diagnosis not present

## 2023-12-01 DIAGNOSIS — I420 Dilated cardiomyopathy: Secondary | ICD-10-CM | POA: Diagnosis present

## 2023-12-01 DIAGNOSIS — Z95810 Presence of automatic (implantable) cardiac defibrillator: Secondary | ICD-10-CM | POA: Diagnosis not present

## 2023-12-01 DIAGNOSIS — E43 Unspecified severe protein-calorie malnutrition: Secondary | ICD-10-CM | POA: Diagnosis present

## 2023-12-01 DIAGNOSIS — Z681 Body mass index (BMI) 19 or less, adult: Secondary | ICD-10-CM | POA: Diagnosis not present

## 2023-12-01 DIAGNOSIS — J439 Emphysema, unspecified: Secondary | ICD-10-CM | POA: Diagnosis present

## 2023-12-01 DIAGNOSIS — I255 Ischemic cardiomyopathy: Secondary | ICD-10-CM | POA: Diagnosis present

## 2023-12-01 HISTORY — DX: Other dysphagia: R13.19

## 2023-12-01 LAB — IRON AND TIBC
Iron: 35 ug/dL (ref 28–170)
Saturation Ratios: 11 % (ref 10.4–31.8)
TIBC: 325 ug/dL (ref 250–450)
UIBC: 290 ug/dL

## 2023-12-01 LAB — RENAL FUNCTION PANEL
Albumin: 2.5 g/dL — ABNORMAL LOW (ref 3.5–5.0)
Anion gap: 11 (ref 5–15)
BUN: 22 mg/dL (ref 8–23)
CO2: 20 mmol/L — ABNORMAL LOW (ref 22–32)
Calcium: 8.5 mg/dL — ABNORMAL LOW (ref 8.9–10.3)
Chloride: 108 mmol/L (ref 98–111)
Creatinine, Ser: 1.27 mg/dL — ABNORMAL HIGH (ref 0.44–1.00)
GFR, Estimated: 46 mL/min — ABNORMAL LOW (ref >60)
Glucose, Bld: 80 mg/dL (ref 70–99)
Phosphorus: 3.4 mg/dL (ref 2.5–4.6)
Potassium: 3.3 mmol/L — ABNORMAL LOW (ref 3.5–5.1)
Sodium: 139 mmol/L (ref 135–145)

## 2023-12-01 LAB — FERRITIN: Ferritin: 90 ng/mL (ref 11–307)

## 2023-12-01 LAB — VITAMIN B12: Vitamin B-12: 1193 pg/mL — ABNORMAL HIGH (ref 180–914)

## 2023-12-01 LAB — FOLATE: Folate: 33.1 ng/mL (ref >5.9)

## 2023-12-01 LAB — TSH: TSH: 0.98 u[IU]/mL (ref 0.350–4.500)

## 2023-12-01 LAB — MAGNESIUM: Magnesium: 1.7 mg/dL (ref 1.7–2.4)

## 2023-12-01 MED ORDER — CARVEDILOL 12.5 MG PO TABS
12.5000 mg | ORAL_TABLET | Freq: Two times a day (BID) | ORAL | Status: DC
  Administered 2023-12-01 – 2023-12-03 (×4): 12.5 mg via ORAL
  Filled 2023-12-01 (×4): qty 1

## 2023-12-01 MED ORDER — FUROSEMIDE 10 MG/ML IJ SOLN
20.0000 mg | Freq: Once | INTRAMUSCULAR | Status: AC
  Administered 2023-12-01: 20 mg via INTRAVENOUS
  Filled 2023-12-01: qty 2

## 2023-12-01 MED ORDER — MAGNESIUM SULFATE 2 GM/50ML IV SOLN
2.0000 g | Freq: Once | INTRAVENOUS | Status: AC
  Administered 2023-12-01: 2 g via INTRAVENOUS
  Filled 2023-12-01: qty 50

## 2023-12-01 MED ORDER — POTASSIUM CHLORIDE CRYS ER 20 MEQ PO TBCR
40.0000 meq | EXTENDED_RELEASE_TABLET | Freq: Once | ORAL | Status: AC
  Administered 2023-12-01: 40 meq via ORAL
  Filled 2023-12-01: qty 2

## 2023-12-01 MED ORDER — ENSURE ENLIVE PO LIQD
237.0000 mL | Freq: Two times a day (BID) | ORAL | Status: DC
  Administered 2023-12-02 – 2023-12-03 (×3): 237 mL via ORAL

## 2023-12-01 NOTE — Progress Notes (Signed)
Internal Medicine Attending:   S: Doing relatively well this morning, sitting up in bed. Reports good UOP after IV lasix. Reports difficulty with food getting stuck in the back of her throat, coughing and choking with liquids.   O:  Blood pressure (!) 150/106, pulse 84, temperature 97.7 F (36.5 C), temperature source Oral, resp. rate 16, height 5' (1.524 m), weight 37.2 kg, SpO2 100%.   Physical Exam Constitutional: cachetic, sarcopenic, chronically ill appearing but NAD Neck: Supple, trachea midline.  Cardiovascular: RRR, no murmurs, rubs, or gallops.  Pulmonary/Chest: CTAB, no wheezes, rales, or rhonchi. Extremities: Warm and well perfused. 1+ edema bilaterally  Neurological: A&Ox3, CN II - XII grossly intact.   A&P: 67 yo with history of dilated ischemic cardiomyopathy, HFrEF with known pericardial effusion, right MCA CVA, progressive dysphagia, unintential weight loss, and sever protein-calorie malnutrition with a BMI of 16.   Dysphagia Weight Loss Underweight BMI 16 Severe protein calorie malnutrition Hypoalbuminemia  Mixed features concerning for pharyngeal / esophageal cause. SLP has been consulted, planning for barium swallow. GI consulted to evaluate for possible inpatient endoscopies.  -- SLP -- MBS ordered -- GI consult, appreciate recs -- RD consult for calorie count  Acute on Chronic HFrEF CAD s/p PCI to the mid RCA Dry weight not established with ongoing weight loss; but BNP elevated with LE edema and vascular congestion on CXR. Responded well to 1 dose of IV lasix 20. Nearing euvolemia on today's exam.  -- IV lasix 20 mg x 1  -- Strick I&Os, daily weights -- AM labs  -- Continue ASA and brillinta   Hx of Right MCA Stroke Discharged on 10/17 with minimal deficits at the time; now with worsening dysphagia  -- Continue ASA and brillinta  -- Non compliant with atorvastatin   HTN: Chronically uncontrolled, resistant to medical therapy in the past. Refuses mose GDMT  for her HF aside from coreg.  -- Increase coreg to 12.5 mg BID  Hx of Type II DM: -- Last Hgb A1c was 5.6 two months ago -- Not currently on medication  -- Daily fasting CBG checks; no SSI for now  Breast Cancer S/p right mastectomy 06/2022. Needs to follow up outpatient with oncology.   FEN: No fluids, replete lytes prn, heart healthy  VTE ppx: SCDs, holding pending GI plans  Code Status: FULL  Please see my H&P attestation from earlier for further details.   Reymundo Poll, M.D.  [employer] Teaching Faculty  12/01/2023, 1:27 PM

## 2023-12-01 NOTE — Evaluation (Signed)
Physical Therapy Brief Evaluation and Discharge Note Patient Details Name: Amy Howell MRN: [id number] DOB: 1956/09/15 Today's Date: 12/01/2023   History of Present Illness  67 year old patient presenting with progressive exertional dyspnea, fatigue, and swelling of the lower extremities. pertinent past medical history of HFrEF, anemia, hypertension, prior CVA, saccular aneurysms, invasive lobular carcinoma of the breast status post resection, CKD 3A, and poor appetite/weight loss.  Clinical Impression  Pt presents with admitting diagnosis above. Pt today states "look I can walk just fine I don't need PT" as she ambulated to trash can and back independently with no AD. Pt then perseverated for 20 minutes on how her insurance charged $200 for PT services last time she was admitted. Pt presents at or near baseline mobility. Pt has no further acute PT needs and will be signing off. Re consult PT if mobility status changes.       PT Assessment Patient does not need any further PT services  Assistance Needed at Discharge  PRN    Equipment Recommendations None recommended by PT  Recommendations for Other Services       Precautions/Restrictions Precautions Precautions: Fall Restrictions Weight Bearing Restrictions Per Provider Order: No        Mobility  Bed Mobility   Supine/Sidelying to sit: Modified independent (Device/Increased time) Sit to supine/sidelying: Modified independent (Device/Increased time)    Transfers Overall transfer level: Independent Equipment used: None                    Ambulation/Gait Ambulation/Gait assistance: Independent Gait Distance (Feet): 5 Feet Assistive device: None Gait Pattern/deviations: WFL(Within Functional Limits) Gait Speed: Pace WFL General Gait Details: Pt states "look I can walk just fine I don't need PT" as she ambulated to trash can and back. Pt then perseverated for 20 minutes on how her insurance charged $200 for PT  services last time she was admitted.  Home Activity Instructions    Stairs            Modified Rankin (Stroke Patients Only)        Balance Overall balance assessment: No apparent balance deficits (not formally assessed)                        Pertinent Vitals/Pain PT - Brief Vital Signs All Vital Signs Stable: Yes Pain Assessment Pain Assessment: 0-10 Pain Score: 5  Pain Location: RLE Pain Descriptors / Indicators: Cramping Pain Intervention(s): Monitored during session     Home Living Family/patient expects to be discharged to:: Private residence Living Arrangements: Children Available Help at Discharge: Family;Available 24 hours/day Home Environment: Level entry   Home Equipment: Tub bench;Cane - single point        Prior Function Level of Independence: Independent      UE/LE Assessment   UE ROM/Strength/Tone/Coordination: WFL    LE ROM/Strength/Tone/Coordination: [facility]      Communication   Communication Communication: No apparent difficulties     Cognition Overall Cognitive Status: Appears within functional limits for tasks assessed/performed (Very tangetial)       General Comments General comments (skin integrity, edema, etc.): VSS    Exercises     Assessment/Plan    PT Problem List         PT Visit Diagnosis Other abnormalities of gait and mobility (R26.89)    No Skilled PT Patient at baseline level of functioning;Patient is independent with all acitivity/mobility   Co-evaluation  AMPAC 6 Clicks Help needed turning from your back to your side while in a flat bed without using bedrails?: None Help needed moving from lying on your back to sitting on the side of a flat bed without using bedrails?: None Help needed moving to and from a bed to a chair (including a wheelchair)?: None Help needed standing up from a chair using your arms (e.g., wheelchair or bedside chair)?: None Help needed to walk in  hospital room?: None Help needed climbing 3-5 steps with a railing? : None 6 Click Score: 24      End of Session   Activity Tolerance: Patient tolerated treatment well Patient left: in bed;with call bell/phone within reach Nurse Communication: Mobility status PT Visit Diagnosis: Other abnormalities of gait and mobility (R26.89)     Time: 1610-9604 PT Time Calculation (min) (ACUTE ONLY): 20 min  Charges:   PT Evaluation $PT Eval Low Complexity: 1 Low      Amy Howell, PT, DPT Acute Rehab Services [phone]   Amy Howell  12/01/2023, 3:47 PM

## 2023-12-01 NOTE — Progress Notes (Signed)
Pt states she has allergies to hospital sheets and gowns.  Pt requesting to use at home washed gear.   Advised pt we need to order hypoallergenic supplies and evaluate from there.   Supplies ordered and pending arrival

## 2023-12-01 NOTE — ED Notes (Signed)
Pt taken for swallow study 

## 2023-12-01 NOTE — Progress Notes (Signed)
Heart Failure Navigator Progress Note  Assessed for Heart & Vascular TOC clinic readiness.  Patient has a scheduled [facility] appointment on 12/09/2023. .   Navigator will sign off at this time.   Rhae Hammock, BSN, Scientist, clinical (histocompatibility and immunogenetics) Only

## 2023-12-01 NOTE — Progress Notes (Signed)
Admission:  Pt arrived from ED.  A&Ox3.  Vital signs per protocol.  CCMD notified.

## 2023-12-01 NOTE — Progress Notes (Signed)
Phlebotomy to draw cortisol when available.

## 2023-12-01 NOTE — Consult Note (Addendum)
Referring Provider: Reymundo Poll Primary Care Physician:  Rana Snare, DO Primary Gastroenterologist:  Dr. Barron Alvine  Reason for Consultation: Dysphagia  HPI: Amy Howell is a 67 y.o. female with a past medical history breast cancer s/p right mastectomy 06/2022, hypertension, coronary artery disease s/p stent 11/2022, ischemic cardiomyopathy with LVEF 30 - 35%, acute CVA 09/15/2023 on Brilinta and ASA, diabetes mellitus type 2 and colon polyps.  She developed shortness of breath with leg swelling since 11/28/2023 therefore she presented to the ED 11/30/2023 for further evaluation.  Admitted with acute on chronic heart failure and ongoing weight loss in the setting of recent CVA.  She also noted having issues with constipation and dysphagia primarily with liquids which results in coughing and feeling choked.  She also described having food that gets stuck in her throat.  Dysphagia symptoms started after she was discharged from the hospital 09/18/2023 with CHF exacerbation complicated by an acute right MCA stroke.  Her clinical status stabilized and she was discharged home on ASA and Brilinta.  Since that time, she continued to have dysphagia with decreased appetite and persistent weight loss.   Labs in the ED showed a WBC count of 4.5.  Hemoglobin 10.7 with a baseline hemoglobin level 11-12's.  Platelet 173.  Sodium 137.  Potassium 3.7.  BUN 24.  Creatinine 1.31.  Albumin 2.5.  Total bili 0.6.  Alk phos 117.  AST 41.  ALT 45.  Troponin 5.  BNP 2,352.4.  Chest x-ray showed nonspecific interstitial prominence consistent with atypical infection versus edema or interstitial lung disease.  Abdominal x-ray was negative.  CTAP without contrast showed cardiomegaly with a small pericardial effusion, small bilateral pleural effusions R > L, diffuse colonic diverticulosis without diverticulitis, minimal retained stool within the proximal colon and aortic atherosclerosis.  Labs today: Potassium 3.3.   Creatinine 1.27.  Iron 35.  TIBC 325.  Saturation ratio was 11.  Ferritin 90.  Vitamin B12 level 1193.  Folate 33.1.  TSH 0.980.  A.m. cortisol level pending.  She underwent a modified barium swallow study today showed a normal oropharyngeal swallow function without aspiration.  Esophageal lumen appeared narrowed but bolus flow was not obstructed.  Esophagus patulous below UES.  Possible cervical osteophytes impinging on the esophagus but did not impede bolus flow.  During pill stimulation there was retention of tablet in the esophagus.,  Liquid wash propel tablet through the esophagus.  Regular texture diet with thin liquids was recommended.  A GI consult was requested for further evaluation regarding persistent dysphagia and weight loss.  She denies having any heartburn or abdominal pain.  She noticed difficulty swallowing within 1 week after she was discharged from the hospital after having a stroke 09/2023.  She sometimes has difficulty swallowing water but other liquids such as tea are not problematic.  She intermittently feels like food gets stuck to her throat area which improves after she drinks tea.  She noticed having increased clear phlegm since September and questions if her phlegm production is contributing to her swallowing issues.  However, she underwent a swallow study earlier today as noted above and she states she had actually no difficulty swallowing any food at that time.  No nausea or vomiting. She takes ASA 81 mg daily at home, no other NSAIDs.  Last dose of ASA and Brilinta  was today at 10 AM.  She endorses losing weight since she was diagnosed with breast cancer due to having a decreased appetite.  It is unclear how much  weight she has lost overall but noted with losing 8 pounds over the past 2 months.  She drinks Ensure Plus at home.  She has intermittent constipation.  No bloody or black stools.  She continues to have DOE and cannot tolerating laying flat.  Her SOB has improved since  she received Lasix in the ED.  No chest pain.  She was initially seen in our office 08/05/2023 to schedule a colonoscopy due to prior history of colon polyps.  A colonoscopy in the hospital setting as an outpatient was initially recommended due to a low EF 30 to 35%.  Dr. Barron Alvine was informed about the patient's hospitalization with his CVA 09/2023 and she was advised to postpone colonoscopy for 3 months.  GI PROCEDURES:  Patient endorsed undergoing 2 colonoscopies in [place], the most recent procedure was at least 10 years ago.   Past Medical History:  Diagnosis Date   Cataracts, bilateral    Cerebral infarction due to carotid artery stenosis (HCC)    Diabetes mellitus type 2 in nonobese (HCC)    Glaucoma    Hypertension    Hypertensive emergency 11/22/2022   Stroke [facility])    no residual, "series of mini strokes"   Tobacco abuse    Vertigo     Past Surgical History:  Procedure Laterality Date   ABDOMINAL HYSTERECTOMY     CARDIAC DEFIBRILLATOR PLACEMENT     CORONARY STENT INTERVENTION N/A 11/26/2022   Procedure: CORONARY STENT INTERVENTION;  Surgeon: Corky Crafts, MD;  [facility]: [facility];  Service: Cardiovascular;  Laterality: N/A;   gum operation     Implantable loop recorder removal  02/01/2019   MDT Reveal LINQ explantation in office by JA   IR 3D INDEPENDENT WKST  05/07/2017   IR 3D INDEPENDENT WKST  09/17/2023   IR ANGIO INTRA EXTRACRAN SEL COM CAROTID INNOMINATE BILAT MOD SED  04/23/2017   IR ANGIO INTRA EXTRACRAN SEL COM CAROTID INNOMINATE BILAT MOD SED  08/06/2017   IR ANGIO INTRA EXTRACRAN SEL COM CAROTID INNOMINATE UNI R MOD SED  05/07/2017   IR ANGIO INTRA EXTRACRAN SEL INTERNAL CAROTID BILAT MOD SED  09/17/2023   IR ANGIO VERTEBRAL SEL VERTEBRAL BILAT MOD SED  04/23/2017   IR ANGIO VERTEBRAL SEL VERTEBRAL BILAT MOD SED  08/06/2017   IR ANGIOGRAM FOLLOW UP STUDY  05/07/2017   IR ANGIOGRAM SELECTIVE EACH ADDITIONAL VESSEL  05/07/2017   IR RADIOLOGIST EVAL &  MGMT  04/16/2017   IR RADIOLOGIST EVAL & MGMT  05/29/2017   IR RADIOLOGIST EVAL & MGMT  09/29/2023   IR RADIOLOGIST EVAL & MGMT  11/05/2023   IR TRANSCATH/EMBOLIZ  05/07/2017   LOOP RECORDER INSERTION N/A 03/28/2017   Procedure: Loop Recorder Insertion;  Surgeon: Hillis Range, MD;  [facility];  Service: Cardiovascular;  Laterality: N/A;   MASTECTOMY W/ SENTINEL NODE BIOPSY Right 06/25/2022   Procedure: RIGHT MASTECTOMY WITH AXILLARY SENTINEL LYMPH NODE BIOPSY;  Surgeon: Manus Rudd, MD;  Location: [facility];  Service: General;  Laterality: Right;   RADIOLOGY WITH ANESTHESIA N/A 05/07/2017   Procedure: EMBOLIZATION;  Surgeon: Julieanne Cotton, MD;  Location: [facility];  Service: Radiology;  Laterality: N/A;   RADIOLOGY WITH ANESTHESIA N/A 08/06/2017   Procedure: RADIOLOGY WITH ANESTHESIA STENTING;  Surgeon: Julieanne Cotton, MD;  [facility]: [facility];  Service: Radiology;  Laterality: N/A;   RIGHT/LEFT HEART CATH AND CORONARY ANGIOGRAPHY N/A 11/26/2022   Procedure: RIGHT/LEFT HEART CATH AND CORONARY ANGIOGRAPHY;  Surgeon:  Corky Crafts, MD;  Location: [facility]VASIVE CV LAB;  Service: Cardiovascular;  Laterality: N/A;   TEE WITHOUT CARDIOVERSION N/A 03/28/2017   Procedure: TRANSESOPHAGEAL ECHOCARDIOGRAM (TEE);  Surgeon: Lewayne Bunting, MD;  Location[facility];  Service: Cardiovascular;  Laterality: N/A;    Prior to Admission medications   Medication Sig Start Date End Date Taking? Authorizing Provider  aspirin 81 MG chewable tablet Chew 1 tablet (81 mg total) by mouth daily. 11/28/22  Yes Danford, Earl Lites, MD  carvedilol (COREG) 6.25 MG tablet Take 1 tablet (6.25 mg total) by mouth 2 (two) times daily. 11/03/23 12/05/23 Yes Rana Snare, DO  feeding supplement (ENSURE ENLIVE / ENSURE PLUS) LIQD Take 237 mLs by mouth 3 (three) times daily between meals. 09/18/23  Yes Gwenevere Abbot, MD  latanoprost (XALATAN) 0.005 % ophthalmic solution Instill 1 drop into both  eyes at bedtime 02/19/22  Yes   Multiple Vitamin (MULTIVITAMIN WITH MINERALS) TABS tablet Take 1 tablet by mouth daily. 09/19/23  Yes Gwenevere Abbot, MD  ticagrelor (BRILINTA) 90 MG TABS tablet Take 1 tablet (90 mg total) by mouth 2 (two) times daily. 11/18/23  Yes Rana Snare, DO  timolol (TIMOPTIC) 0.5 % ophthalmic solution Place 1 drop into both eyes every morning. 04/30/23  Yes   dorzolamide (TRUSOPT) 2 % ophthalmic solution Instill 1 drop into both eyes twice a day Patient taking differently: Place 1 drop into both eyes daily. 02/19/22       Current Facility-Administered Medications  Medication Dose Route Frequency Provider Last Rate Last Admin   acetaminophen (TYLENOL) tablet 650 mg  650 mg Oral Q6H PRN Modena Slater, DO       Or   acetaminophen (TYLENOL) suppository 650 mg  650 mg Rectal Q6H PRN Modena Slater, DO       aspirin chewable tablet 81 mg  81 mg Oral Daily Modena Slater, DO   81 mg at 12/01/23 0948   carvedilol (COREG) tablet 12.5 mg  12.5 mg Oral BID WC Reymundo Poll, MD       feeding supplement (ENSURE ENLIVE / ENSURE PLUS) liquid 237 mL  237 mL Oral BID BM Modena Slater, DO       polyethylene glycol (MIRALAX / GLYCOLAX) packet 17 g  17 g Oral Daily Modena Slater, DO       ticagrelor (BRILINTA) tablet 90 mg  90 mg Oral BID Modena Slater, DO   90 mg at 12/01/23 9629   Current Outpatient Medications  Medication Sig Dispense Refill   aspirin 81 MG chewable tablet Chew 1 tablet (81 mg total) by mouth daily. 30 tablet 3   carvedilol (COREG) 6.25 MG tablet Take 1 tablet (6.25 mg total) by mouth 2 (two) times daily. 60 tablet 0   feeding supplement (ENSURE ENLIVE / ENSURE PLUS) LIQD Take 237 mLs by mouth 3 (three) times daily between meals. 237 mL 12   latanoprost (XALATAN) 0.005 % ophthalmic solution Instill 1 drop into both eyes at bedtime 22.5 mL 3   Multiple Vitamin (MULTIVITAMIN WITH MINERALS) TABS tablet Take 1 tablet by mouth daily. 30 tablet 0   ticagrelor (BRILINTA) 90 MG TABS  tablet Take 1 tablet (90 mg total) by mouth 2 (two) times daily. 60 tablet 11   timolol (TIMOPTIC) 0.5 % ophthalmic solution Place 1 drop into both eyes every morning. 45 mL 3   dorzolamide (TRUSOPT) 2 % ophthalmic solution Instill 1 drop into both eyes twice a day (Patient taking differently: Place 1 drop into both eyes  daily.) 30 mL 3    Allergies as of 11/30/2023 - Review Complete 11/30/2023  Allergen Reaction Noted   Bidil [isosorb dinitrate-hydralazine] Other (See Comments) 09/24/2023   Lisinopril Other (See Comments) 03/23/2020   Pork-derived products Hives 11/26/2022   Rosuvastatin Other (See Comments) 03/17/2023    Family History  Problem Relation Age of Onset   Dementia Mother    Kidney disease Father    Hypertension Father    Diabetes Father    Heart disease Father    Lung cancer Maternal Aunt        dx after 73   Prostate cancer Other        MGM's brother   Breast cancer Neg Hx    Colon cancer Neg Hx    Liver disease Neg Hx    Esophageal cancer Neg Hx     Social History   Socioeconomic History   Marital status: Single    Spouse name: Not on file   Number of children: 2   Years of education: Not on file   Highest education level: 10th grade  Occupational History   Occupation: retired  Tobacco Use   Smoking status: Former    Current packs/day: 0.50    Average packs/day: 0.5 packs/day for 56.0 years (28.0 ttl pk-yrs)    Types: Cigarettes   Smokeless tobacco: Never  Vaping Use   Vaping status: Never Used  Substance and Sexual Activity   Alcohol use: No   Drug use: No   Sexual activity: Not Currently    Birth control/protection: Surgical    Comment: Hyst  Other Topics Concern   Not on file  Social History Narrative   Not on file   Social Drivers of Health   Financial Resource Strain: Low Risk  (09/17/2023)   Overall Financial Resource Strain (CARDIA)    Difficulty of Paying Living Expenses: Not hard at all  Food Insecurity: No Food Insecurity  (12/01/2023)   Hunger Vital Sign    Worried About Running Out of Food in the Last Year: Never true    Ran Out of Food in the Last Year: Never true  Transportation Needs: No Transportation Needs (12/01/2023)   PRAPARE - Administrator, Civil Service (Medical): No    Lack of Transportation (Non-Medical): No  Physical Activity: Insufficiently Active (04/08/2023)   Exercise Vital Sign    Days of Exercise per Week: 3 days    Minutes of Exercise per Session: 30 min  Stress: No Stress Concern Present (04/08/2023)   [employer] of Occupational Health - Occupational Stress Questionnaire    Feeling of Stress : Not at all  Social Connections: Socially Isolated (12/01/2023)   Social Connection and Isolation Panel [NHANES]    Frequency of Communication with Friends and Family: Three times a week    Frequency of Social Gatherings with Friends and Family: Twice a week    Attends Religious Services: Never    Database administrator or Organizations: No    Attends Banker Meetings: Never    Marital Status: Never married  Intimate Partner Violence: Not At Risk (12/01/2023)   Humiliation, Afraid, Rape, and Kick questionnaire    Fear of Current or Ex-Partner: No    Emotionally Abused: No    Physically Abused: No    Sexually Abused: No    Review of Systems: Gen: See HPI.  No fevers or night sweats.   CV: Denies chest pain, palpitations or edema. Resp: See HPI.   GI:  Denies heartburn, dysphagia, stomach or lower abdominal pain. No diarrhea or constipation. No rectal bleeding or melena.   GU : Denies urinary burning, blood in urine, increased urinary frequency or incontinence. MS: Denies joint pain, muscles aches or weakness. Derm: Denies rash, itchiness, skin lesions or unhealing ulcers. Psych: Denies depression, anxiety, memory loss or confusion. Heme: Denies easy bruising, bleeding. Neuro:  Denies headaches, dizziness or paresthesias. Endo:  + DM type II.  Physical  Exam: Vital signs in last 24 hours: Temp:  [97.7 F (36.5 C)-98 F (36.7 C)] 97.8 F (36.6 C) (12/30 1417) Pulse Rate:  [66-100] 85 (12/30 1330) Resp:  [14-32] 18 (12/30 1330) BP: (147-191)/(57-106) 147/96 (12/30 1330) SpO2:  [97 %-100 %] 100 % (12/30 1330)   General: Alert 67 year old thin female in no acute distress. Head:  Normocephalic and atraumatic. Eyes:  No scleral icterus. Conjunctiva pink. Ears:  Normal auditory acuity. Nose:  No deformity, discharge or lesions. Mouth:  Dentition intact. No ulcers or lesions.  Neck:  Supple. No lymphadenopathy or thyromegaly.  Lungs: Breath sounds clear throughout. No wheezes, rhonchi or crackles.  Heart: Regular rate and rhythm, no murmurs. Abdomen:, Nondistended.  Nontender.  Positive bowel sounds all 4 quadrants.  No palpable mass.  No bruit. Rectal: Deferred. Musculoskeletal:  Symmetrical without gross deformities.  Pulses:  Normal pulses noted. Extremities: Mild bilateral lower extremity edema. Neurologic:  Alert and  oriented x 4. No focal deficits.  Skin:  Intact without significant lesions or rashes. Psych:  Alert and cooperative. Normal mood and affect.  Intake/Output from previous day: No intake/output data recorded. Intake/Output this shift: No intake/output data recorded.  Lab Results: Recent Labs    11/30/23 1232  WBC 4.5  HGB 10.7*  HCT 34.5*  PLT 173   BMET Recent Labs    11/30/23 1232 12/01/23 0440  NA 137 139  K 3.7 3.3*  CL 109 108  CO2 21* 20*  GLUCOSE 103* 80  BUN 24* 22  CREATININE 1.31* 1.27*  CALCIUM 8.3* 8.5*   LFT Recent Labs    11/30/23 1232 12/01/23 0440  PROT 5.7*  --   ALBUMIN 2.5* 2.5*  AST 41  --   ALT 45*  --   ALKPHOS 117  --   BILITOT 0.6  --    PT/INR No results for input(s): "LABPROT", "INR" in the last 72 hours. Hepatitis Panel No results for input(s): "HEPBSAG", "HCVAB", "HEPAIGM", "HEPBIGM" in the last 72 hours.    Studies/Results: DG Swallowing Func-Speech  Pathology Result Date: 12/01/2023 Table formatting from the original result was not included. Images from the original result were not included. Modified Barium Swallow Study Patient Details Name: Jalaina Grossen MRN: [id number] Date of Birth: 1956-07-09 Today's Date: 12/01/2023 HPI/PMH: HPI: Brysa Preziosi is a 68 year old woman who presented with progressive exertional dyspnea, fatigue, and swelling of the lower extremities. Patient states that she hs had increased orthopnea (5 pillows), exertional dyspnea, fatigue and weight loss since her last hospital discharge in October. Seen by GI in [place], but only with colonoscopy. No upper endoscopy or fluoroscopy. CXR 12/29 without focal consolidation, but possible atypiclal   Pt with pertinent past medical history of HFrEF, anemia, hypertension, prior CVA, saccular aneurysms, invasive lobular carcinoma of the breast status post resection, CKD 3A, and poor appetite/weight loss. Clinical Impression: Clinical Impression: Pt presents with grossly normal oropharyngeal swallow function.  There was no penetration or aspiration with any consistencies trialed and pt is protecting airway well.   Esophageal lumen appeared narrowed,  but bolus flow was not obstructed.  Esophagus at times patulous below UES.  There are possible cervical osteophytes impinging on esophagus but this did not impede bolus flow.  See image below.  During pill simulation there was no oral or pharyngeal stasis of tablet, but there was retention of tablet in esophagus.  Liquid wash helped to propel tablet through esophagus.  Pt may benefit from further assessment of esophagus should swallowing difficulty persist. Recommend regular texture diet with thin liquids. Factors that may increase risk of adverse event in presence of aspiration Rubye Oaks & Clearance Coots 2021): No data recorded Recommendations/Plan: Swallowing Evaluation Recommendations Swallowing Evaluation Recommendations Recommendations: PO diet PO Diet  Recommendation: Regular; Thin liquids (Level 0) Liquid Administration via: Straw; Cup Medication Administration: Whole meds with liquid Supervision: Patient able to self-feed Swallowing strategies  : Small bites/sips; Slow rate Postural changes: Stay upright 30-60 min after meals; Position pt fully upright for meals Oral care recommendations: Oral care BID (2x/day) Recommended consults: Consider GI consultation Treatment Plan Treatment Plan Treatment recommendations: Therapy as outlined in treatment plan below Follow-up recommendations: Follow physicians's recommendations for discharge plan and follow up therapies Treatment frequency: Min 2x/week Treatment duration: 2 weeks Interventions: Diet toleration management by SLP Recommendations Recommendations for follow up therapy are one component of a multi-disciplinary discharge planning process, led by the attending physician.  Recommendations may be updated based on patient status, additional functional criteria and insurance authorization. Assessment: Orofacial Exam: Orofacial Exam Oral Cavity: Oral Hygiene: WFL Oral Cavity - Dentition: Adequate natural dentition Orofacial Anatomy: WFL Oral Motor/Sensory Function: WFL (See BSE) Anatomy: Anatomy: Suspected cervical osteophytes Boluses Administered: Boluses Administered Boluses Administered: Thin liquids (Level 0); Mildly thick liquids (Level 2, nectar thick); Moderately thick liquids (Level 3, honey thick); Solid; Puree  Oral Impairment Domain: Oral Impairment Domain Lip Closure: No labial escape Tongue control during bolus hold: Cohesive bolus between tongue to palatal seal Bolus preparation/mastication: Timely and efficient chewing and mashing Bolus transport/lingual motion: Brisk tongue motion Oral residue: Complete oral clearance Location of oral residue : N/A  Pharyngeal Impairment Domain: Pharyngeal Impairment Domain Soft palate elevation: No bolus between soft palate (SP)/pharyngeal wall (PW) Laryngeal  elevation: Complete superior movement of thyroid cartilage with complete approximation of arytenoids to epiglottic petiole Epiglottic movement: Complete inversion Laryngeal vestibule closure: Complete, no air/contrast in laryngeal vestibule Pharyngeal stripping wave : Present - diminished Pharyngeal contraction (A/P view only): N/A Pharyngoesophageal segment opening: Partial distention/partial duration, partial obstruction of flow Tongue base retraction: Trace column of contrast or air between tongue base and PPW Pharyngeal residue: Complete pharyngeal clearance Location of pharyngeal residue: N/A  Esophageal Impairment Domain: Esophageal Impairment Domain Esophageal clearance upright position: Esophageal retention Pill: Pill Consistency administered: Thin liquids (Level 0) Thin liquids (Level 0): [facility] Penetration/Aspiration Scale Score: Penetration/Aspiration Scale Score 1.  Material does not enter airway: Thin liquids (Level 0); Mildly thick liquids (Level 2, nectar thick); Moderately thick liquids (Level 3, honey thick); Puree; Solid; Pill Compensatory Strategies: Compensatory Strategies Compensatory strategies: No   General Information: Caregiver present: No  Diet Prior to this Study: Regular; Thin liquids (Level 0)   Temperature : Normal   Respiratory Status: WFL   No data recorded  History of Recent Intubation: No  Behavior/Cognition: Alert; Cooperative; Pleasant mood Self-Feeding Abilities: Able to self-feed Baseline vocal quality/speech: Normal No data recorded Volitional Swallow: Able to elicit Exam Limitations: No limitations Goal Planning: Prognosis for improved oropharyngeal function: -- (N/A) No data recorded No data recorded No data recorded Consulted and agree  with results and recommendations: Patient Pain: Pain Assessment Pain Assessment: 0-10 Pain Score: 5 Faces Pain Scale: 0 Pain Location: RLE Pain Descriptors / Indicators: Cramping Pain Intervention(s): Monitored during session End of Session:  Start Time:SLP Start Time (ACUTE ONLY): 0956 Stop Time: SLP Stop Time (ACUTE ONLY): 1013 Time Calculation:SLP Time Calculation (min) (ACUTE ONLY): 17 min Charges: SLP Evaluations $ SLP Speech Visit: 1 Visit SLP Evaluations $BSS Swallow: 1 Procedure SLP visit diagnosis: SLP Visit Diagnosis: Dysphagia, unspecified (R13.10) Past Medical History: Past Medical History: Diagnosis Date  Cataracts, bilateral   Cerebral infarction due to carotid artery stenosis (HCC)   Diabetes mellitus type 2 in nonobese (HCC)   Glaucoma   Hypertension   Hypertensive emergency 11/22/2022  Stroke (HCC)   no residual, "series of mini strokes"  Tobacco abuse   Vertigo  Past Surgical History: Past Surgical History: Procedure Laterality Date  ABDOMINAL HYSTERECTOMY    CARDIAC DEFIBRILLATOR PLACEMENT    CORONARY STENT INTERVENTION N/A 11/26/2022  Procedure: CORONARY STENT INTERVENTION;  Surgeon: Corky Crafts, MD;  [facility];  Service: Cardiovascular;  Laterality: N/A;  gum operation    Implantable loop recorder removal  02/01/2019  MDT Reveal LINQ explantation in office by JA  IR 3D INDEPENDENT WKST  05/07/2017  IR 3D INDEPENDENT WKST  09/17/2023  IR ANGIO INTRA EXTRACRAN SEL COM CAROTID INNOMINATE BILAT MOD SED  04/23/2017  IR ANGIO INTRA EXTRACRAN SEL COM CAROTID INNOMINATE BILAT MOD SED  08/06/2017  IR ANGIO INTRA EXTRACRAN SEL COM CAROTID INNOMINATE UNI R MOD SED  05/07/2017  IR ANGIO INTRA EXTRACRAN SEL INTERNAL CAROTID BILAT MOD SED  09/17/2023  IR ANGIO VERTEBRAL SEL VERTEBRAL BILAT MOD SED  04/23/2017  IR ANGIO VERTEBRAL SEL VERTEBRAL BILAT MOD SED  08/06/2017  IR ANGIOGRAM FOLLOW UP STUDY  05/07/2017  IR ANGIOGRAM SELECTIVE EACH ADDITIONAL VESSEL  05/07/2017  IR RADIOLOGIST EVAL & MGMT  04/16/2017  IR RADIOLOGIST EVAL & MGMT  05/29/2017  IR RADIOLOGIST EVAL & MGMT  09/29/2023  IR RADIOLOGIST EVAL & MGMT  11/05/2023  IR TRANSCATH/EMBOLIZ  05/07/2017  LOOP RECORDER INSERTION N/A 03/28/2017  Procedure: Loop Recorder Insertion;   Surgeon: Hillis Range, MD;  [facility];  Service: Cardiovascular;  Laterality: N/A;  MASTECTOMY W/ SENTINEL NODE BIOPSY Right 06/25/2022  Procedure: RIGHT MASTECTOMY WITH AXILLARY SENTINEL LYMPH NODE BIOPSY;  Surgeon: Manus Rudd, MD;  Location: [facility];  Service: General;  Laterality: Right;  RADIOLOGY WITH ANESTHESIA N/A 05/07/2017  Procedure: EMBOLIZATION;  Surgeon: Julieanne Cotton, MD;  Location: [facility];  Service: Radiology;  Laterality: N/A;  RADIOLOGY WITH ANESTHESIA N/A 08/06/2017  Procedure: RADIOLOGY WITH ANESTHESIA STENTING;  Surgeon: Julieanne Cotton, MD;  Location: [facility];  Service: Radiology;  Laterality: N/A;  RIGHT/LEFT HEART CATH AND CORONARY ANGIOGRAPHY N/A 11/26/2022  Procedure: RIGHT/LEFT HEART CATH AND CORONARY ANGIOGRAPHY;  Surgeon: Corky Crafts, MD;  Location: [facility] INVASIVE CV LAB;  Service: Cardiovascular;  Laterality: N/A;  TEE WITHOUT CARDIOVERSION N/A 03/28/2017  Procedure: TRANSESOPHAGEAL ECHOCARDIOGRAM (TEE);  Surgeon: Lewayne Bunting, MD;  Location: [facility] ENDOSCOPY;  Service: Cardiovascular;  Laterality: N/A; Kerrie Pleasure, MA, CCC-SLP Acute Rehabilitation Services Office: [phone] 12/01/2023, 1:36 PM  VAS [place] LOWER EXTREMITY VENOUS (DVT) (ONLY [facility] & [facility]) Result Date: 12/01/2023  Lower Venous DVT Study Patient Name:  JESSICCA FILIPPONE  Date of Exam:   11/30/2023 Medical Rec #: [id number]      Accession #:    [id number] Date of Birth: [birth year]  Patient Gender: F Patient Age:   25 years Exam Location:  [facility] Procedure:      VAS [place] LOWER EXTREMITY VENOUS (DVT) Referring Phys: RILEY RANSOM --------------------------------------------------------------------------------  Indications: Increased leg swelling and SOB since 11/28/23. Patient with heart failure but medicinal non compliance.  Comparison Study: No prior study on file Performing Technologist: Sherren Kerns RVS  Examination Guidelines: A complete evaluation includes B-mode  imaging, spectral Doppler, color Doppler, and power Doppler as needed of all accessible portions of each vessel. Bilateral testing is considered an integral part of a complete examination. Limited examinations for reoccurring indications may be performed as noted. The reflux portion of the exam is performed with the patient in reverse Trendelenburg.  +-----+---------------+---------+-----------+------------------+--------------+ RIGHTCompressibilityPhasicitySpontaneityProperties        Thrombus Aging +-----+---------------+---------+-----------+------------------+--------------+ CFV  Full           Yes      No         pulsatile waveform               +-----+---------------+---------+-----------+------------------+--------------+   +--------+---------------+---------+-----------+----------------+-------------+ LEFT    CompressibilityPhasicitySpontaneityProperties      Thrombus                                                                 Aging         +--------+---------------+---------+-----------+----------------+-------------+ CFV     Full           Yes      No         pulsatile                                                                waveform                      +--------+---------------+---------+-----------+----------------+-------------+ SFJ     Full                                                             +--------+---------------+---------+-----------+----------------+-------------+ FV Prox Full                                                             +--------+---------------+---------+-----------+----------------+-------------+ FV Mid  Full                                                             +--------+---------------+---------+-----------+----------------+-------------+ FV      Full  Distal                                                                    +--------+---------------+---------+-----------+----------------+-------------+ PFV     Full                                                             +--------+---------------+---------+-----------+----------------+-------------+ POP     Full           Yes      No         pulsatile                                                                waveform                      +--------+---------------+---------+-----------+----------------+-------------+ PTV     Full                                                             +--------+---------------+---------+-----------+----------------+-------------+ PERO    Full                                                             +--------+---------------+---------+-----------+----------------+-------------+     Summary: RIGHT: - No evidence of common femoral vein obstruction.  pulsatile waveforms consistent with fluid overload  LEFT: - There is no evidence of deep vein thrombosis in the lower extremity.  - No cystic structure found in the popliteal fossa. Pulsatile waveforms consistent with fluid overload.  *See table(s) above for measurements and observations. Electronically signed by Carolynn Sayers on 12/01/2023 at 10:56:55 AM.    Final    CT ABDOMEN PELVIS WO CONTRAST Result Date: 11/30/2023 CLINICAL DATA:  Increasing short of breath and lower extremity edema, mucous in stools, loss of appetite, constipation EXAM: CT ABDOMEN AND PELVIS WITHOUT CONTRAST TECHNIQUE: Multidetector CT imaging of the abdomen and pelvis was performed following the standard protocol without IV contrast. Unenhanced CT was performed per clinician order. Lack of IV contrast limits sensitivity and specificity, especially for evaluation of abdominal/pelvic solid viscera. RADIATION DOSE REDUCTION: This exam was performed according to the departmental dose-optimization program which includes automated exposure control, adjustment of the mA and/or kV  according to patient size and/or use of iterative reconstruction technique. COMPARISON:  11/30/2023, 09/12/2023 FINDINGS: Lower chest: The heart is enlarged with small pericardial effusion. There are bilateral pleural effusions, right greater than left. Emphysema, with bibasilar atelectasis.  Hepatobiliary: Unremarkable unenhanced appearance of the liver and gallbladder. Pancreas: Unremarkable unenhanced appearance. Spleen: Unremarkable unenhanced appearance. Adrenals/Urinary Tract: No urinary tract calculi or obstructive uropathy. Stable proteinaceous left renal cyst and simple right renal cyst. No specific imaging follow-up is required. The adrenals and bladder are unremarkable. Stomach/Bowel: No bowel obstruction or ileus. Normal retrocecal appendix. Diffuse colonic diverticulosis without evidence of acute diverticulitis. No bowel wall thickening or inflammatory change. Minimal retained stool within the proximal colon. Vascular/Lymphatic: Aortic atherosclerosis. No enlarged abdominal or pelvic lymph nodes. Reproductive: Status post hysterectomy. No adnexal masses. Other: No free fluid or free intraperitoneal gas. No abdominal wall hernia. Musculoskeletal: No acute or destructive bony abnormalities. Reconstructed images demonstrate no additional findings. IMPRESSION: 1. Cardiomegaly with small pericardial effusion. 2. Small bilateral pleural effusions, right greater than left, with bibasilar atelectasis. 3. Diffuse colonic diverticulosis without diverticulitis. 4. No bowel obstruction or ileus. Minimal retained stool within the proximal colon. 5. Aortic Atherosclerosis (ICD10-I70.0) and Emphysema (ICD10-J43.9). Electronically Signed   By: Sharlet Salina M.D.   On: 11/30/2023 20:59   DG Abdomen 1 View Result Date: 11/30/2023 CLINICAL DATA:  Constipation. EXAM: ABDOMEN - 1 VIEW COMPARISON:  None Available. FINDINGS: The bowel gas pattern is normal. No radio-opaque calculi or other significant radiographic  abnormality are seen. IMPRESSION: Negative. Electronically Signed   By: Layla Maw M.D.   On: 11/30/2023 13:23   DG Chest 2 View Result Date: 11/30/2023 CLINICAL DATA:  sob EXAM: CHEST - 2 VIEW COMPARISON:  09/12/2023. FINDINGS: Cardiac silhouette is prominent. There is diffuse pulmonary interstitial prominence which could be seen with atypical infection, edema or interstitial lung disease. There is no focal consolidation. No pneumothorax. Small right-sided pleural effusion. Aorta is calcified. The visualized osseous structures are unremarkable. IMPRESSION: Nonspecific interstitial prominence consistent with atypical infection, edema or interstitial lung disease. No focal consolidation. Electronically Signed   By: Layla Maw M.D.   On: 11/30/2023 13:22    IMPRESSION/PLAN:  67 year old female admitted to the hospital with DOE/SOB with progressive lower extremity edema admitted with acute on chronic heart failure.  Received Furosemide 20 mg IV x 2  in the ED. CTAP without contrast showed cardiomegaly with a small pericardial effusion, small bilateral pleural effusions R > L.  LVEF 30 to 35% per ECHO 10/2023. -Management per the medical service  Dysphagia, started approximately one week s/p acute CVA 09/2023.  Modified barium swallow study showed normal oropharyngeal swallow function without aspiration, esophageal lumen appeared narrowed but bolus flow was not obstructed, possible cervical osteophytes noted but unlikely contributing factor and there was brief pill retention in the esophagus which passed after liquid wash.  -Barium swallow study  -Defer endoscopic recommendations to Dr. Adela Lank -Regular texture diet with thin liquids per speech pathologist  Progressive weight loss since diagnosis of breast cancer status postmastectomy 06/2022.  CTAP without contrast findings did not explain etiology for weight loss.  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  Normocytic anemia.  Hemoglobin 11.2 -> 10.7. No overt GI bleeding.   History of colon polyps -Patient to follow-up in our outpatient GI clinic to determine appropriate timing of colonoscopy in setting of recent CVA 09/2023, appointment previously scheduled 210:40 AM on 01/06/2024.  Constipation -Miralax Q HS PRN  DM type II   Arnaldo Natal  12/01/2023, 4:11 PM

## 2023-12-01 NOTE — ED Notes (Signed)
PT at bedside.

## 2023-12-01 NOTE — Evaluation (Signed)
Clinical/Bedside Swallow Evaluation Patient Details  Name: Amy Howell MRN: [id number] Date of Birth: [birth year]  Today's Date: 12/01/2023 Time: SLP Start Time (ACUTE ONLY): 0865 SLP Stop Time (ACUTE ONLY): 1013 SLP Time Calculation (min) (ACUTE ONLY): 17 min  Past Medical History:  Past Medical History:  Diagnosis Date   Cataracts, bilateral    Cerebral infarction due to carotid artery stenosis (HCC)    Diabetes mellitus type 2 in nonobese (HCC)    Glaucoma    Hypertension    Hypertensive emergency 11/22/2022   Stroke (HCC)    no residual, "series of mini strokes"   Tobacco abuse    Vertigo    Past Surgical History:  Past Surgical History:  Procedure Laterality Date   ABDOMINAL HYSTERECTOMY     CARDIAC DEFIBRILLATOR PLACEMENT     CORONARY STENT INTERVENTION N/A 11/26/2022   Procedure: CORONARY STENT INTERVENTION;  Surgeon: Amy Crafts, MD;  Location: [facility] INVASIVE CV LAB;  Service: Cardiovascular;  Laterality: N/A;   gum operation     Implantable loop recorder removal  02/01/2019   MDT Reveal LINQ explantation in office by Amy Howell   IR 3D INDEPENDENT WKST  05/07/2017   IR 3D INDEPENDENT WKST  09/17/2023   IR ANGIO INTRA EXTRACRAN SEL COM CAROTID INNOMINATE BILAT MOD SED  04/23/2017   IR ANGIO INTRA EXTRACRAN SEL COM CAROTID INNOMINATE BILAT MOD SED  08/06/2017   IR ANGIO INTRA EXTRACRAN SEL COM CAROTID INNOMINATE UNI R MOD SED  05/07/2017   IR ANGIO INTRA EXTRACRAN SEL INTERNAL CAROTID BILAT MOD SED  09/17/2023   IR ANGIO VERTEBRAL SEL VERTEBRAL BILAT MOD SED  04/23/2017   IR ANGIO VERTEBRAL SEL VERTEBRAL BILAT MOD SED  08/06/2017   IR ANGIOGRAM FOLLOW UP STUDY  05/07/2017   IR ANGIOGRAM SELECTIVE EACH ADDITIONAL VESSEL  05/07/2017   IR RADIOLOGIST EVAL & MGMT  04/16/2017   IR RADIOLOGIST EVAL & MGMT  05/29/2017   IR RADIOLOGIST EVAL & MGMT  09/29/2023   IR RADIOLOGIST EVAL & MGMT  11/05/2023   IR TRANSCATH/EMBOLIZ  05/07/2017   LOOP RECORDER INSERTION N/A 03/28/2017   Procedure: Loop  Recorder Insertion;  Surgeon: Amy Range, MD;  Location: [facility] INVASIVE CV LAB;  Service: Cardiovascular;  Laterality: N/A;   MASTECTOMY W/ SENTINEL NODE BIOPSY Right 06/25/2022   Procedure: RIGHT MASTECTOMY WITH AXILLARY SENTINEL LYMPH NODE BIOPSY;  Surgeon: Amy Rudd, MD;  Location: [facility];  Service: General;  Laterality: Right;   RADIOLOGY WITH ANESTHESIA N/A 05/07/2017   Procedure: EMBOLIZATION;  Surgeon: Amy Cotton, MD;  Location: [facility] OR;  Service: Radiology;  Laterality: N/A;   RADIOLOGY WITH ANESTHESIA N/A 08/06/2017   Procedure: RADIOLOGY WITH ANESTHESIA STENTING;  Surgeon: Amy Cotton, MD;  Location: [facility] OR;  Service: Radiology;  Laterality: N/A;   RIGHT/LEFT HEART CATH AND CORONARY ANGIOGRAPHY N/A 11/26/2022   Procedure: RIGHT/LEFT HEART CATH AND CORONARY ANGIOGRAPHY;  Surgeon: Amy Crafts, MD;  Location: [facility] INVASIVE CV LAB;  Service: Cardiovascular;  Laterality: N/A;   TEE WITHOUT CARDIOVERSION N/A 03/28/2017   Procedure: TRANSESOPHAGEAL ECHOCARDIOGRAM (TEE);  Surgeon: Amy Bunting, MD;  Location: [facility] ENDOSCOPY;  Service: Cardiovascular;  Laterality: N/A;   HPI:  Amy Howell is a 67 year old woman who presented with progressive exertional dyspnea, fatigue, and swelling of the lower extremities. Patient states that she hs had increased orthopnea (5 pillows), exertional dyspnea, fatigue and weight loss since her last hospital discharge in October. Seen by GI in [place], but only with colonoscopy. No upper  endoscopy or fluoroscopy. CXR 12/29 without focal consolidation, but possible atypiclal   Pt with pertinent past medical history of HFrEF, anemia, hypertension, prior CVA, saccular aneurysms, invasive lobular carcinoma of the breast status post resection, CKD 3A, and poor appetite/weight loss.    Assessment / Plan / Recommendation  Clinical Impression  Pt presents with functional swallowing as assessed clinically.  She tolerated all consistencies  trialed without any clinical s/s of aspiration and exhibited good oral clearance of solids.  She reports that today's evaluation marks an improvement in swallow function.  Concern for prandial aspiration still exists.  She reports feeling like things are sticking after she swallows and not going down. She has gagging and also some coughing and feelings of airway intrusion. She feels this especially happens with water, but it occurs with solids as well.  She takes small bites and masticates very thoroughly.  She feels that drinking tea is easer than water. She has lost a significant amount of weight.  Pt denies GERD.  She is waiting to see OP GI for colonoscopy.  She has had colonoscopy in the past, but no UGI imaging or endoscopy. Chest imaging with possible atypical infection and pt presented with increased WOB.  Pt would benefit from instrumental assessment to rule out silent aspiration and MBSS would allow for visualization of UES and cervical esophagus.  Pt can continue current diet pending results of instrumental.  Recommend regular texture solids and thin liquids.   SLP Visit Diagnosis: Dysphagia, unspecified (R13.10)    Aspiration Risk  Mild aspiration risk    Diet Recommendation Regular;Thin liquid    Medication Administration:  (No specific precautions) Supervision: Patient able to self feed Compensations: Slow rate;Small sips/bites Postural Changes: Seated upright at 90 degrees;Remain upright for at least 30 minutes after po intake    Other  Recommendations Recommended Consults: Consider GI evaluation Oral Care Recommendations: Oral care BID    Recommendations for follow up therapy are one component of a multi-disciplinary discharge planning process, led by the attending physician.  Recommendations may be updated based on patient status, additional functional criteria and insurance authorization.  Follow up Recommendations  (TBD)      Assistance Recommended at Discharge  N/A   Functional Status Assessment  (TBD)  Frequency and Duration  (TBD)          Prognosis Prognosis for improved oropharyngeal function:  (TBD)      Swallow Study   General Date of Onset: 11/30/23 HPI: Pristine Salvetti is a 67 year old woman who presented with progressive exertional dyspnea, fatigue, and swelling of the lower extremities. Patient states that she hs had increased orthopnea (5 pillows), exertional dyspnea, fatigue and weight loss since her last hospital discharge in October. Seen by GI in [place], but only with colonoscopy. No upper endoscopy or fluoroscopy. CXR 12/29 without focal consolidation, but possible atypiclal   Pt with pertinent past medical history of HFrEF, anemia, hypertension, prior CVA, saccular aneurysms, invasive lobular carcinoma of the breast status post resection, CKD 3A, and poor appetite/weight loss. Type of Study: Bedside Swallow Evaluation Previous Swallow Assessment: None Diet Prior to this Study: Regular;Thin liquids (Level 0) Temperature Spikes Noted: No History of Recent Intubation: No Behavior/Cognition: Alert;Cooperative;Pleasant mood Oral Cavity Assessment: Within Functional Limits Oral Care Completed by SLP: No Oral Cavity - Dentition: Adequate natural dentition Vision: Functional for self-feeding Self-Feeding Abilities: Able to feed self Patient Positioning: Upright in bed Baseline Vocal Quality: Normal Volitional Cough: Strong Volitional Swallow: Able to elicit  Oral/Motor/Sensory Function Overall Oral Motor/Sensory Function: Within functional limits Facial ROM: Within Functional Limits Facial Symmetry: Within Functional Limits Lingual ROM: Within Functional Limits Lingual Symmetry: Within Functional Limits Lingual Strength: Within Functional Limits Velum: Within Functional Limits Mandible: Within Functional Limits   Ice Chips Ice chips: Not tested   Thin Liquid Thin Liquid: Within functional limits Presentation: Cup    Nectar Thick  Nectar Thick Liquid: Not tested   Honey Thick Honey Thick Liquid: Not tested   Puree Puree: Within functional limits Presentation: Spoon;Self Fed   Solid     Solid: Within functional limits Presentation: Self Fed      Kerrie Pleasure, MA, CCC-SLP Acute Rehabilitation Services Office: [phone]  12/01/2023,10:37 AM

## 2023-12-01 NOTE — Procedures (Signed)
Modified Barium Swallow Study  Patient Details  Name: Amy Howell MRN: [id number] Date of Birth: 1956-05-23  Today's Date: 12/01/2023  Modified Barium Swallow completed.  Full report located under Chart Review in the Imaging Section.  History of Present Illness Amy Howell is a 67 year old woman who presented with progressive exertional dyspnea, fatigue, and swelling of the lower extremities. Patient states that she hs had increased orthopnea (5 pillows), exertional dyspnea, fatigue and weight loss since her last hospital discharge in October. Seen by GI in [place], but only with colonoscopy. No upper endoscopy or fluoroscopy. CXR 12/29 without focal consolidation, but possible atypiclal   Pt with pertinent past medical history of HFrEF, anemia, hypertension, prior CVA, saccular aneurysms, invasive lobular carcinoma of the breast status post resection, CKD 3A, and poor appetite/weight loss.   Clinical Impression Pt presents with grossly normal oropharyngeal swallow function.  There was no penetration or aspiration with any consistencies trialed and pt is protecting airway well.   Esophageal lumen appeared narrowed, but bolus flow was not obstructed.  Esophagus at times patulous below UES.  There are possible cervical osteophytes impinging on esophagus but this did not impede bolus flow.  See image below.  During pill simulation there was no oral or pharyngeal stasis of tablet, but there was retention of tablet in esophagus.  Liquid wash helped to propel tablet through esophagus.  Pt may benefit from further assessment of esophagus should swallowing difficulty persist.  Recommend continuing regular texture diet with thin liquids.     Swallow Evaluation Recommendations Recommendations: PO diet PO Diet Recommendation: Regular;Thin liquids (Level 0) Liquid Administration via: Straw;Cup Medication Administration: Whole meds with liquid Supervision: Patient able to self-feed Swallowing strategies   : Small bites/sips;Slow rate Postural changes: Stay upright 30-60 min after meals;Position pt fully upright for meals Oral care recommendations: Oral care BID (2x/day) Recommended consults: Consider GI consultation      Kerrie Pleasure, MA, CCC-SLP Acute Rehabilitation Services Office: [phone] 12/01/2023,1:34 PM

## 2023-12-01 NOTE — ED Notes (Addendum)
Pt currently in swallow study, not in room.

## 2023-12-01 NOTE — ED Notes (Signed)
ED TO INPATIENT HANDOFF REPORT  ED Nurse Name and Phone #: Al Corpus, RN [phone]  S Name/Age/Gender Amy Howell 67 y.o. female Room/Bed: 043C/043C  Code Status   Code Status: Full Code  Home/SNF/Other Home Patient oriented to: self, place, time, and situation Is this baseline? Yes   Triage Complete: Triage complete  Chief Complaint Acute exacerbation of CHF (congestive heart failure) (HCC) [I50.9]  Triage Note Patient reports leg swelling and increased sob since 12/27.  Patient has HF but refuses to take her meds other than brillinta and coreg.  Patient reports mucous in her stools, loss of appetite and constipation.    Allergies Allergies  Allergen Reactions   Bidil [Isosorb Dinitrate-Hydralazine] Other (See Comments)    Caused a stroke   Lisinopril Other (See Comments)    Angioedema   Pork-Derived Products Hives   Rosuvastatin Other (See Comments)    headache   Statins Other (See Comments)    Headaches and swelling    Level of Care/Admitting Diagnosis ED Disposition     ED Disposition  Admit   Condition  --   Comment  Hospital Area: [facility] [[id number]]  Level of Care: Telemetry Medical [104]  May admit patient to [facility] or [facility] if equivalent level of care is available:: No  Covid Evaluation: Asymptomatic - no recent exposure (last 10 days) testing not required  Diagnosis: Acute exacerbation of CHF (congestive heart failure) [facility]) [[id number]]  Admitting Physician: Reymundo Poll [[id number]]  Attending Physician: Reymundo Poll [[id number]]  Certification:: I certify this patient will need inpatient services for at least 2 midnights  Expected Medical Readiness: 12/02/2023          B Medical/Surgery History Past Medical History:  Diagnosis Date   Cataracts, bilateral    Cerebral infarction due to carotid artery stenosis (HCC)    Diabetes mellitus type 2 in nonobese (HCC)    Glaucoma    Hypertension    Hypertensive  emergency 11/22/2022   Stroke [facility])    no residual, "series of mini strokes"   Tobacco abuse    Vertigo    Past Surgical History:  Procedure Laterality Date   ABDOMINAL HYSTERECTOMY     CARDIAC DEFIBRILLATOR PLACEMENT     CORONARY STENT INTERVENTION N/A 11/26/2022   Procedure: CORONARY STENT INTERVENTION;  Surgeon: Corky Crafts, MD;  [facility] IN[facility];  Service: Cardiovascular;  Laterality: N/A;   gum operation     Implantable loop recorder removal  02/01/2019   MDT Reveal LINQ explantation in office by JA   IR 3D INDEPENDENT WKST  05/07/2017   IR 3D INDEPENDENT WKST  09/17/2023   IR ANGIO INTRA EXTRACRAN SEL COM CAROTID INNOMINATE BILAT MOD SED  04/23/2017   IR ANGIO INTRA EXTRACRAN SEL COM CAROTID INNOMINATE BILAT MOD SED  08/06/2017   IR ANGIO INTRA EXTRACRAN SEL COM CAROTID INNOMINATE UNI R MOD SED  05/07/2017   IR ANGIO INTRA EXTRACRAN SEL INTERNAL CAROTID BILAT MOD SED  09/17/2023   IR ANGIO VERTEBRAL SEL VERTEBRAL BILAT MOD SED  04/23/2017   IR ANGIO VERTEBRAL SEL VERTEBRAL BILAT MOD SED  08/06/2017   IR ANGIOGRAM FOLLOW UP STUDY  05/07/2017   IR ANGIOGRAM SELECTIVE EACH ADDITIONAL VESSEL  05/07/2017   IR RADIOLOGIST EVAL & MGMT  04/16/2017   IR RADIOLOGIST EVAL & MGMT  05/29/2017   IR RADIOLOGIST EVAL & MGMT  09/29/2023   IR RADIOLOGIST EVAL & MGMT  11/05/2023   IR TRANSCATH/EMBOLIZ  05/07/2017  LOOP RECORDER INSERTION N/A 03/28/2017   Procedure: Loop Recorder Insertion;  Surgeon: Hillis Range, MD;  [facility];  Service: Cardiovascular;  Laterality: N/A;   MASTECTOMY W/ SENTINEL NODE BIOPSY Right 06/25/2022   Procedure: RIGHT MASTECTOMY WITH AXILLARY SENTINEL LYMPH NODE BIOPSY;  Surgeon: Manus Rudd, MD;  Location: [facility];  Service: General;  Laterality: Right;   RADIOLOGY WITH ANESTHESIA N/A 05/07/2017   Procedure: EMBOLIZATION;  Surgeon: Julieanne Cotton, MD;  Location: [facility] OR;  Service: Radiology;  Laterality: N/A;   RADIOLOGY WITH  ANESTHESIA N/A 08/06/2017   Procedure: RADIOLOGY WITH ANESTHESIA STENTING;  Surgeon: Julieanne Cotton, MD;  [facility]: [facility] OR;  Service: Radiology;  Laterality: N/A;   RIGHT/LEFT HEART CATH AND CORONARY ANGIOGRAPHY N/A 11/26/2022   Procedure: RIGHT/LEFT HEART CATH AND CORONARY ANGIOGRAPHY;  Surgeon: Corky Crafts, MD;  Location: [facility] INVASIVE CV LAB;  Service: Cardiovascular;  Laterality: N/A;   TEE WITHOUT CARDIOVERSION N/A 03/28/2017   Procedure: TRANSESOPHAGEAL ECHOCARDIOGRAM (TEE);  Surgeon: Lewayne Bunting, MD;  Location: [facility] ENDOSCOPY;  Service: Cardiovascular;  Laterality: N/A;     A IV Location/Drains/Wounds Patient Lines/Drains/Airways Status     Active Line/Drains/Airways     Name Placement date Placement time Site Days   Peripheral IV 11/30/23 20 G Anterior;Left;Proximal Forearm 11/30/23  2134  Forearm  1            Intake/Output Last 24 hours No intake or output data in the 24 hours ending 12/01/23 1821  Labs/Imaging Results for orders placed or performed during the hospital encounter of 11/30/23 (from the past 48 hours)  CBC with Differential     Status: Abnormal   Collection Time: 11/30/23 12:32 PM  Result Value Ref Range   WBC 4.5 4.0 - 10.5 K/uL   RBC 3.63 (L) 3.87 - 5.11 MIL/uL   Hemoglobin 10.7 (L) 12.0 - 15.0 g/dL   HCT 16.1 (L) 09.6 - 04.5 %   MCV 95.0 80.0 - 100.0 fL   MCH 29.5 26.0 - 34.0 pg   MCHC 31.0 30.0 - 36.0 g/dL   RDW 40.9 (H) 81.1 - 91.4 %   Platelets 173 150 - 400 K/uL   nRBC 0.0 0.0 - 0.2 %   Neutrophils Relative % 67 %   Neutro Abs 3.0 1.7 - 7.7 K/uL   Lymphocytes Relative 22 %   Lymphs Abs 1.0 0.7 - 4.0 K/uL   Monocytes Relative 9 %   Monocytes Absolute 0.4 0.1 - 1.0 K/uL   Eosinophils Relative 2 %   Eosinophils Absolute 0.1 0.0 - 0.5 K/uL   Basophils Relative 0 %   Basophils Absolute 0.0 0.0 - 0.1 K/uL   Immature Granulocytes 0 %   Abs Immature Granulocytes 0.00 0.00 - 0.07 K/uL    Comment: Performed at [facility]  Lab, [address], [place], [place] [address]  Comprehensive metabolic panel     Status: Abnormal   Collection Time: 11/30/23 12:32 PM  Result Value Ref Range   Sodium 137 135 - 145 mmol/L   Potassium 3.7 3.5 - 5.1 mmol/L   Chloride 109 98 - 111 mmol/L   CO2 21 (L) 22 - 32 mmol/L   Glucose, Bld 103 (H) 70 - 99 mg/dL    Comment: Glucose reference range applies only to samples taken after fasting for at least 8 hours.   BUN 24 (H) 8 - 23 mg/dL   Creatinine, Ser 6.21 (H) 0.44 - 1.00 mg/dL   Calcium 8.3 (L)  8.9 - 10.3 mg/dL   Total Protein 5.7 (L) 6.5 - 8.1 g/dL   Albumin 2.5 (L) 3.5 - 5.0 g/dL   AST 41 15 - 41 U/L   ALT 45 (H) 0 - 44 U/L   Alkaline Phosphatase 117 38 - 126 U/L   Total Bilirubin 0.6 <1.2 mg/dL   GFR, Estimated 44 (L) >60 mL/min    Comment: (NOTE) Calculated using the CKD-EPI Creatinine Equation (2021)    Anion gap 7 5 - 15    Comment: Performed at [facility] Lab, [address], [place], [place] [address]  Troponin I (High Sensitivity)     Status: None   Collection Time: 11/30/23 12:32 PM  Result Value Ref Range   Troponin I (High Sensitivity) 5 <18 ng/L    Comment: (NOTE) Elevated high sensitivity troponin I (hsTnI) values and significant  changes across serial measurements may suggest ACS but many other  chronic and acute conditions are known to elevate hsTnI results.  Refer to the "Links" section for chest pain algorithms and additional  guidance. Performed at [facility] Lab, [address], [place], [place] [address]   Troponin I (High Sensitivity)     Status: None   Collection Time: 11/30/23  5:38 PM  Result Value Ref Range   Troponin I (High Sensitivity) 5 <18 ng/L    Comment: (NOTE) Elevated high sensitivity troponin I (hsTnI) values and significant  changes across serial measurements may suggest ACS but many other  chronic and acute conditions are known to elevate hsTnI results.  Refer to the "Links" section for chest pain algorithms and  additional  guidance. Performed at [facility] Lab, [address] [place], [place] [address]   Brain natriuretic peptide     Status: Abnormal   Collection Time: 11/30/23  5:38 PM  Result Value Ref Range   B Natriuretic Peptide 2,352.4 (H) 0.0 - 100.0 pg/mL    Comment: Performed at [facility] Lab, [address] [place], [place] [address]  Renal function panel     Status: Abnormal   Collection Time: 12/01/23  4:40 AM  Result Value Ref Range   Sodium 139 135 - 145 mmol/L   Potassium 3.3 (L) 3.5 - 5.1 mmol/L   Chloride 108 98 - 111 mmol/L   CO2 20 (L) 22 - 32 mmol/L   Glucose, Bld 80 70 - 99 mg/dL    Comment: Glucose reference range applies only to samples taken after fasting for at least 8 hours.   BUN 22 8 - 23 mg/dL   Creatinine, Ser 6.57 (H) 0.44 - 1.00 mg/dL   Calcium 8.5 (L) 8.9 - 10.3 mg/dL   Phosphorus 3.4 2.5 - 4.6 mg/dL   Albumin 2.5 (L) 3.5 - 5.0 g/dL   GFR, Estimated 46 (L) >60 mL/min    Comment: (NOTE) Calculated using the CKD-EPI Creatinine Equation (2021)    Anion gap 11 5 - 15    Comment: Performed at [facility] Lab, [address], [place], [place] [address]  Ferritin     Status: None   Collection Time: 12/01/23  4:40 AM  Result Value Ref Range   Ferritin 90 11 - 307 ng/mL    Comment: Performed at [facility] Lab, [address], [place], [place] [address]  Vitamin B12     Status: Abnormal   Collection Time: 12/01/23  4:40 AM  Result Value Ref Range   Vitamin B-12 1,193 (H) 180 - 914 pg/mL  Comment: (NOTE) This assay is not validated for testing neonatal or myeloproliferative syndrome specimens for Vitamin B12 levels. Performed at [facility] Lab, [address] [place], [place] [address]   Iron and TIBC     Status: None   Collection Time: 12/01/23  4:40 AM  Result Value Ref Range   Iron 35 28 - 170 ug/dL   TIBC 604 540 - 981 ug/dL   Saturation Ratios 11 10.4 - 31.8 %   UIBC 290 ug/dL    Comment: Performed at [facility]  Lab, [address], [place], [place] [address]  Magnesium     Status: None   Collection Time: 12/01/23  4:40 AM  Result Value Ref Range   Magnesium 1.7 1.7 - 2.4 mg/dL    Comment: Performed at [facility] Lab, [address], [place], [place] [address]  Folate     Status: None   Collection Time: 12/01/23  4:40 AM  Result Value Ref Range   Folate 33.1 >5.9 ng/mL    Comment: Performed at [facility] Lab, [address], [place], [place] [address]  TSH     Status: None   Collection Time: 12/01/23  4:40 AM  Result Value Ref Range   TSH 0.980 0.350 - 4.500 uIU/mL    Comment: Performed by a 3rd Generation assay with a functional sensitivity of <=0.01 uIU/mL. Performed at [facility] Lab, [address] [place], [place] [address]    DG Swallowing Func-Speech Pathology Result Date: 12/01/2023 Table formatting from the original result was not included. Images from the original result were not included. Modified Barium Swallow Study Patient Details Name: Amy Howell MRN: [id number] Date of Birth: 1956/04/16 Today's Date: 12/01/2023 HPI/PMH: HPI: Montressa Pfiffner is a 67 year old woman who presented with progressive exertional dyspnea, fatigue, and swelling of the lower extremities. Patient states that she hs had increased orthopnea (5 pillows), exertional dyspnea, fatigue and weight loss since her last hospital discharge in October. Seen by GI in [place], but only with colonoscopy. No upper endoscopy or fluoroscopy. CXR 12/29 without focal consolidation, but possible atypiclal   Pt with pertinent past medical history of HFrEF, anemia, hypertension, prior CVA, saccular aneurysms, invasive lobular carcinoma of the breast status post resection, CKD 3A, and poor appetite/weight loss. Clinical Impression: Clinical Impression: Pt presents with grossly normal oropharyngeal swallow function.  There was no penetration or aspiration with any consistencies trialed and pt is protecting airway well.   Esophageal lumen  appeared narrowed, but bolus flow was not obstructed.  Esophagus at times patulous below UES.  There are possible cervical osteophytes impinging on esophagus but this did not impede bolus flow.  See image below.  During pill simulation there was no oral or pharyngeal stasis of tablet, but there was retention of tablet in esophagus.  Liquid wash helped to propel tablet through esophagus.  Pt may benefit from further assessment of esophagus should swallowing difficulty persist. Recommend regular texture diet with thin liquids. Factors that may increase risk of adverse event in presence of aspiration Rubye Oaks & Clearance Coots 2021): No data recorded Recommendations/Plan: Swallowing Evaluation Recommendations Swallowing Evaluation Recommendations Recommendations: PO diet PO Diet Recommendation: Regular; Thin liquids (Level 0) Liquid Administration via: Straw; Cup Medication Administration: Whole meds with liquid Supervision: Patient able to self-feed Swallowing strategies  : Small bites/sips; Slow rate Postural changes: Stay upright 30-60 min after meals; Position pt fully upright for meals Oral care recommendations: Oral care BID (2x/day) Recommended consults: Consider GI consultation  Treatment Plan Treatment Plan Treatment recommendations: Therapy as outlined in treatment plan below Follow-up recommendations: Follow physicians's recommendations for discharge plan and follow up therapies Treatment frequency: Min 2x/week Treatment duration: 2 weeks Interventions: Diet toleration management by SLP Recommendations Recommendations for follow up therapy are one component of a multi-disciplinary discharge planning process, led by the attending physician.  Recommendations may be updated based on patient status, additional functional criteria and insurance authorization. Assessment: Orofacial Exam: Orofacial Exam Oral Cavity: Oral Hygiene: WFL Oral Cavity - Dentition: Adequate natural dentition Orofacial Anatomy: WFL Oral  Motor/Sensory Function: WFL (See BSE) Anatomy: Anatomy: Suspected cervical osteophytes Boluses Administered: Boluses Administered Boluses Administered: Thin liquids (Level 0); Mildly thick liquids (Level 2, nectar thick); Moderately thick liquids (Level 3, honey thick); Solid; Puree  Oral Impairment Domain: Oral Impairment Domain Lip Closure: No labial escape Tongue control during bolus hold: Cohesive bolus between tongue to palatal seal Bolus preparation/mastication: Timely and efficient chewing and mashing Bolus transport/lingual motion: Brisk tongue motion Oral residue: Complete oral clearance Location of oral residue : N/A  Pharyngeal Impairment Domain: Pharyngeal Impairment Domain Soft palate elevation: No bolus between soft palate (SP)/pharyngeal wall (PW) Laryngeal elevation: Complete superior movement of thyroid cartilage with complete approximation of arytenoids to epiglottic petiole Epiglottic movement: Complete inversion Laryngeal vestibule closure: Complete, no air/contrast in laryngeal vestibule Pharyngeal stripping wave : Present - diminished Pharyngeal contraction (A/P view only): N/A Pharyngoesophageal segment opening: Partial distention/partial duration, partial obstruction of flow Tongue base retraction: Trace column of contrast or air between tongue base and PPW Pharyngeal residue: Complete pharyngeal clearance Location of pharyngeal residue: N/A  Esophageal Impairment Domain: Esophageal Impairment Domain Esophageal clearance upright position: Esophageal retention Pill: Pill Consistency administered: Thin liquids (Level 0) Thin liquids (Level 0): [facility] Penetration/Aspiration Scale Score: Penetration/Aspiration Scale Score 1.  Material does not enter airway: Thin liquids (Level 0); Mildly thick liquids (Level 2, nectar thick); Moderately thick liquids (Level 3, honey thick); Puree; Solid; Pill Compensatory Strategies: Compensatory Strategies Compensatory strategies: No   General Information:  Caregiver present: No  Diet Prior to this Study: Regular; Thin liquids (Level 0)   Temperature : Normal   Respiratory Status: WFL   No data recorded  History of Recent Intubation: No  Behavior/Cognition: Alert; Cooperative; Pleasant mood Self-Feeding Abilities: Able to self-feed Baseline vocal quality/speech: Normal No data recorded Volitional Swallow: Able to elicit Exam Limitations: No limitations Goal Planning: Prognosis for improved oropharyngeal function: -- (N/A) No data recorded No data recorded No data recorded Consulted and agree with results and recommendations: Patient Pain: Pain Assessment Pain Assessment: 0-10 Pain Score: 5 Faces Pain Scale: 0 Pain Location: RLE Pain Descriptors / Indicators: Cramping Pain Intervention(s): Monitored during session End of Session: Start Time:SLP Start Time (ACUTE ONLY): 0956 Stop Time: SLP Stop Time (ACUTE ONLY): 1013 Time Calculation:SLP Time Calculation (min) (ACUTE ONLY): 17 min Charges: SLP Evaluations $ SLP Speech Visit: 1 Visit SLP Evaluations $BSS Swallow: 1 Procedure SLP visit diagnosis: SLP Visit Diagnosis: Dysphagia, unspecified (R13.10) Past Medical History: Past Medical History: Diagnosis Date  Cataracts, bilateral   Cerebral infarction due to carotid artery stenosis (HCC)   Diabetes mellitus type 2 in nonobese (HCC)   Glaucoma   Hypertension   Hypertensive emergency 11/22/2022  Stroke (HCC)   no residual, "series of mini strokes"  Tobacco abuse   Vertigo  Past Surgical History: Past Surgical History: Procedure Laterality Date  ABDOMINAL HYSTERECTOMY    CARDIAC DEFIBRILLATOR PLACEMENT    CORONARY STENT INTERVENTION N/A 11/26/2022  Procedure: CORONARY  STENT INTERVENTION;  Surgeon: Corky Crafts, MD;  Location: [facility] INVASIVE CV LAB;  Service: Cardiovascular;  Laterality: N/A;  gum operation    Implantable loop recorder removal  02/01/2019  MDT Reveal LINQ explantation in office by JA  IR 3D INDEPENDENT WKST  05/07/2017  IR 3D INDEPENDENT WKST  09/17/2023  IR  ANGIO INTRA EXTRACRAN SEL COM CAROTID INNOMINATE BILAT MOD SED  04/23/2017  IR ANGIO INTRA EXTRACRAN SEL COM CAROTID INNOMINATE BILAT MOD SED  08/06/2017  IR ANGIO INTRA EXTRACRAN SEL COM CAROTID INNOMINATE UNI R MOD SED  05/07/2017  IR ANGIO INTRA EXTRACRAN SEL INTERNAL CAROTID BILAT MOD SED  09/17/2023  IR ANGIO VERTEBRAL SEL VERTEBRAL BILAT MOD SED  04/23/2017  IR ANGIO VERTEBRAL SEL VERTEBRAL BILAT MOD SED  08/06/2017  IR ANGIOGRAM FOLLOW UP STUDY  05/07/2017  IR ANGIOGRAM SELECTIVE EACH ADDITIONAL VESSEL  05/07/2017  IR RADIOLOGIST EVAL & MGMT  04/16/2017  IR RADIOLOGIST EVAL & MGMT  05/29/2017  IR RADIOLOGIST EVAL & MGMT  09/29/2023  IR RADIOLOGIST EVAL & MGMT  11/05/2023  IR TRANSCATH/EMBOLIZ  05/07/2017  LOOP RECORDER INSERTION N/A 03/28/2017  Procedure: Loop Recorder Insertion;  Surgeon: Hillis Range, MD;  [facility] INVAS[facility] CV LAB;  Service: Cardiovascular;  Laterality: N/A;  MASTECTOMY W/ SENTINEL NODE BIOPSY Right 06/25/2022  Procedure: RIGHT MASTECTOMY WITH AXILLARY SENTINEL LYMPH NODE BIOPSY;  Surgeon: Manus Rudd, MD;  Location: [facility];  Service: General;  Laterality: Right;  RADIOLOGY WITH ANESTHESIA N/A 05/07/2017  Procedure: EMBOLIZATION;  Surgeon: Julieanne Cotton, MD;  Location: [facility] OR;  Service: Radiology;  Laterality: N/A;  RADIOLOGY WITH ANESTHESIA N/A 08/06/2017  Procedure: RADIOLOGY WITH ANESTHESIA STENTING;  Surgeon: Julieanne Cotton, MD;  Location: [facility] OR;  Service: Radiology;  Laterality: N/A;  RIGHT/LEFT HEART CATH AND CORONARY ANGIOGRAPHY N/A 11/26/2022  Procedure: RIGHT/LEFT HEART CATH AND CORONARY ANGIOGRAPHY;  Surgeon: Corky Crafts, MD;  Location: [facility] INVASIVE CV LAB;  Service: Cardiovascular;  Laterality: N/A;  TEE WITHOUT CARDIOVERSION N/A 03/28/2017  Procedure: TRANSESOPHAGEAL ECHOCARDIOGRAM (TEE);  Surgeon: Lewayne Bunting, MD;  Location: [facility] ENDOSCOPY;  Service: Cardiovascular;  Laterality: N/A; Kerrie Pleasure, MA, CCC-SLP Acute Rehabilitation Services Office:  [phone] 12/01/2023, 1:36 PM  VAS [place] LOWER EXTREMITY VENOUS (DVT) (ONLY [facility] & [facility]) Result Date: 12/01/2023  Lower Venous DVT Study Patient Name:  Amy Howell  Date of Exam:   11/30/2023 Medical Rec #: [id number]      Accession #:    [id number] Date of Birth: 1956/09/05       Patient Gender: F Patient Age:   58 years Exam Location:  [facility] Procedure:      VAS [place] LOWER EXTREMITY VENOUS (DVT) Referring Phys: RILEY RANSOM --------------------------------------------------------------------------------  Indications: Increased leg swelling and SOB since 11/28/23. Patient with heart failure but medicinal non compliance.  Comparison Study: No prior study on file Performing Technologist: Sherren Kerns RVS  Examination Guidelines: A complete evaluation includes B-mode imaging, spectral Doppler, color Doppler, and power Doppler as needed of all accessible portions of each vessel. Bilateral testing is considered an integral part of a complete examination. Limited examinations for reoccurring indications may be performed as noted. The reflux portion of the exam is performed with the patient in reverse Trendelenburg.  +-----+---------------+---------+-----------+------------------+--------------+ RIGHTCompressibilityPhasicitySpontaneityProperties        Thrombus Aging +-----+---------------+---------+-----------+------------------+--------------+ CFV  Full           Yes      No         pulsatile waveform               +-----+---------------+---------+-----------+------------------+--------------+   +--------+---------------+---------+-----------+----------------+-------------+  LEFT    CompressibilityPhasicitySpontaneityProperties      Thrombus                                                                 Aging         +--------+---------------+---------+-----------+----------------+-------------+ CFV     Full           Yes      No         pulsatile                                                                 waveform                      +--------+---------------+---------+-----------+----------------+-------------+ SFJ     Full                                                             +--------+---------------+---------+-----------+----------------+-------------+ FV Prox Full                                                             +--------+---------------+---------+-----------+----------------+-------------+ FV Mid  Full                                                             +--------+---------------+---------+-----------+----------------+-------------+ FV      Full                                                             Distal                                                                   +--------+---------------+---------+-----------+----------------+-------------+ PFV     Full                                                             +--------+---------------+---------+-----------+----------------+-------------+  POP     Full           Yes      No         pulsatile                                                                waveform                      +--------+---------------+---------+-----------+----------------+-------------+ PTV     Full                                                             +--------+---------------+---------+-----------+----------------+-------------+ PERO    Full                                                             +--------+---------------+---------+-----------+----------------+-------------+     Summary: RIGHT: - No evidence of common femoral vein obstruction.  pulsatile waveforms consistent with fluid overload  LEFT: - There is no evidence of deep vein thrombosis in the lower extremity.  - No cystic structure found in the popliteal fossa. Pulsatile waveforms consistent with fluid overload.  *See table(s) above for measurements and  observations. Electronically signed by Carolynn Sayers on 12/01/2023 at 10:56:55 AM.    Final    CT ABDOMEN PELVIS WO CONTRAST Result Date: 11/30/2023 CLINICAL DATA:  Increasing short of breath and lower extremity edema, mucous in stools, loss of appetite, constipation EXAM: CT ABDOMEN AND PELVIS WITHOUT CONTRAST TECHNIQUE: Multidetector CT imaging of the abdomen and pelvis was performed following the standard protocol without IV contrast. Unenhanced CT was performed per clinician order. Lack of IV contrast limits sensitivity and specificity, especially for evaluation of abdominal/pelvic solid viscera. RADIATION DOSE REDUCTION: This exam was performed according to the departmental dose-optimization program which includes automated exposure control, adjustment of the mA and/or kV according to patient size and/or use of iterative reconstruction technique. COMPARISON:  11/30/2023, 09/12/2023 FINDINGS: Lower chest: The heart is enlarged with small pericardial effusion. There are bilateral pleural effusions, right greater than left. Emphysema, with bibasilar atelectasis. Hepatobiliary: Unremarkable unenhanced appearance of the liver and gallbladder. Pancreas: Unremarkable unenhanced appearance. Spleen: Unremarkable unenhanced appearance. Adrenals/Urinary Tract: No urinary tract calculi or obstructive uropathy. Stable proteinaceous left renal cyst and simple right renal cyst. No specific imaging follow-up is required. The adrenals and bladder are unremarkable. Stomach/Bowel: No bowel obstruction or ileus. Normal retrocecal appendix. Diffuse colonic diverticulosis without evidence of acute diverticulitis. No bowel wall thickening or inflammatory change. Minimal retained stool within the proximal colon. Vascular/Lymphatic: Aortic atherosclerosis. No enlarged abdominal or pelvic lymph nodes. Reproductive: Status post hysterectomy. No adnexal masses. Other: No free fluid or free intraperitoneal gas. No abdominal wall  hernia. Musculoskeletal: No acute or destructive bony abnormalities. Reconstructed images demonstrate no additional findings. IMPRESSION: 1. Cardiomegaly with small pericardial effusion. 2. Small bilateral pleural effusions, right greater  than left, with bibasilar atelectasis. 3. Diffuse colonic diverticulosis without diverticulitis. 4. No bowel obstruction or ileus. Minimal retained stool within the proximal colon. 5. Aortic Atherosclerosis (ICD10-I70.0) and Emphysema (ICD10-J43.9). Electronically Signed   By: Sharlet Salina M.D.   On: 11/30/2023 20:59   DG Abdomen 1 View Result Date: 11/30/2023 CLINICAL DATA:  Constipation. EXAM: ABDOMEN - 1 VIEW COMPARISON:  None Available. FINDINGS: The bowel gas pattern is normal. No radio-opaque calculi or other significant radiographic abnormality are seen. IMPRESSION: Negative. Electronically Signed   By: Layla Maw M.D.   On: 11/30/2023 13:23   DG Chest 2 View Result Date: 11/30/2023 CLINICAL DATA:  sob EXAM: CHEST - 2 VIEW COMPARISON:  09/12/2023. FINDINGS: Cardiac silhouette is prominent. There is diffuse pulmonary interstitial prominence which could be seen with atypical infection, edema or interstitial lung disease. There is no focal consolidation. No pneumothorax. Small right-sided pleural effusion. Aorta is calcified. The visualized osseous structures are unremarkable. IMPRESSION: Nonspecific interstitial prominence consistent with atypical infection, edema or interstitial lung disease. No focal consolidation. Electronically Signed   By: Layla Maw M.D.   On: 11/30/2023 13:22    Pending Labs Unresulted Labs (From admission, onward)     Start     Ordered   12/02/23 0600  Cortisol-am, blood  Once,   R       Comments: Between 6 and 9AM    12/01/23 1803   12/02/23 0600  VITAMIN D 25 Hydroxy (Vit-D Deficiency, Fractures)  Tomorrow morning,   R        12/01/23 1803   12/02/23 0600  Basic metabolic panel  Tomorrow morning,   R         12/01/23 1803            Vitals/Pain Today's Vitals   12/01/23 1615 12/01/23 1630 12/01/23 1700 12/01/23 1730  BP:  (!) 175/93 (!) 157/90 (!) 129/111  Pulse:   86 91  Resp:  (!) 26 (!) 26 19  Temp:      TempSrc:      SpO2:   100% 100%  Weight:      Height:      PainSc: 0-No pain       Isolation Precautions No active isolations  Medications Medications  acetaminophen (TYLENOL) tablet 650 mg (has no administration in time range)    Or  acetaminophen (TYLENOL) suppository 650 mg (has no administration in time range)  polyethylene glycol (MIRALAX / GLYCOLAX) packet 17 g (17 g Oral Not Given 12/01/23 0953)  aspirin chewable tablet 81 mg (81 mg Oral Given 12/01/23 0948)  ticagrelor (BRILINTA) tablet 90 mg (90 mg Oral Given 12/01/23 0948)  feeding supplement (ENSURE ENLIVE / ENSURE PLUS) liquid 237 mL (237 mLs Oral Not Given 12/01/23 1338)  carvedilol (COREG) tablet 12.5 mg (12.5 mg Oral Given 12/01/23 1615)  furosemide (LASIX) injection 20 mg (20 mg Intravenous Given 11/30/23 2135)  iohexol (OMNIPAQUE) 300 MG/ML solution 30 mL (30 mLs Oral Contrast Given 11/30/23 1612)  potassium chloride SA (KLOR-CON M) CR tablet 40 mEq (40 mEq Oral Given 12/01/23 0616)  magnesium sulfate IVPB 2 g 50 mL (0 g Intravenous Stopped 12/01/23 0721)  furosemide (LASIX) injection 20 mg (20 mg Intravenous Given 12/01/23 0809)    Mobility walks     Focused Assessments Pulmonary Assessment Handoff:  Lung sounds:   O2 Device: Room Air      R Recommendations: See Admitting Provider Note  Report given to:   Additional Notes: A&Ox4, NAD, calm,  interactive, [place] 2L, ambulatory, has been up to [facility]

## 2023-12-01 NOTE — Progress Notes (Signed)
OT Cancellation Note  Patient Details Name: Amy Howell MRN: [id number] DOB: 08-29-1956   Cancelled Treatment:    Reason Eval/Treat Not Completed: OT screened, no needs identified, will sign off.  Advised by primary PT patient is at baseline with no OT needs.    Laszlo Ellerby D Doyne Ellinger 12/01/2023, 12:06 PM 12/01/2023  RP, OTR/L  Acute Rehabilitation Services  Office:  [phone]

## 2023-12-01 NOTE — ED Notes (Addendum)
GI NP at [facility]

## 2023-12-01 NOTE — ED Notes (Signed)
GI MD at [facility].

## 2023-12-01 NOTE — ED Notes (Addendum)
Returns from radiology to 43, alert, NAD, calm, interactive, speech clear. Denies pain, sob, nausea, HA or dizziness.

## 2023-12-02 ENCOUNTER — Encounter (HOSPITAL_COMMUNITY): Payer: Self-pay | Admitting: Internal Medicine

## 2023-12-02 ENCOUNTER — Inpatient Hospital Stay (HOSPITAL_COMMUNITY): Payer: Medicare (Managed Care)

## 2023-12-02 DIAGNOSIS — Z7902 Long term (current) use of antithrombotics/antiplatelets: Secondary | ICD-10-CM | POA: Diagnosis not present

## 2023-12-02 DIAGNOSIS — R1310 Dysphagia, unspecified: Secondary | ICD-10-CM | POA: Diagnosis not present

## 2023-12-02 LAB — BASIC METABOLIC PANEL
Anion gap: 8 (ref 5–15)
BUN: 16 mg/dL (ref 8–23)
CO2: 24 mmol/L (ref 22–32)
Calcium: 8.8 mg/dL — ABNORMAL LOW (ref 8.9–10.3)
Chloride: 106 mmol/L (ref 98–111)
Creatinine, Ser: 1.13 mg/dL — ABNORMAL HIGH (ref 0.44–1.00)
GFR, Estimated: 53 mL/min — ABNORMAL LOW (ref >60)
Glucose, Bld: 108 mg/dL — ABNORMAL HIGH (ref 70–99)
Potassium: 4 mmol/L (ref 3.5–5.1)
Sodium: 138 mmol/L (ref 135–145)

## 2023-12-02 LAB — CORTISOL-AM, BLOOD: Cortisol - AM: 18.9 ug/dL (ref 6.7–22.6)

## 2023-12-02 LAB — VITAMIN D 25 HYDROXY (VIT D DEFICIENCY, FRACTURES): Vit D, 25-Hydroxy: 53.44 ng/mL (ref 30–100)

## 2023-12-02 MED ORDER — ADULT MULTIVITAMIN W/MINERALS CH
1.0000 | ORAL_TABLET | Freq: Every day | ORAL | Status: DC
  Administered 2023-12-02 – 2023-12-03 (×2): 1 via ORAL
  Filled 2023-12-02 (×2): qty 1

## 2023-12-02 MED ORDER — THIAMINE MONONITRATE 100 MG PO TABS
100.0000 mg | ORAL_TABLET | Freq: Every day | ORAL | Status: DC
  Administered 2023-12-02 – 2023-12-03 (×2): 100 mg via ORAL
  Filled 2023-12-02 (×2): qty 1

## 2023-12-02 NOTE — Progress Notes (Signed)
 [facility] Progress Note  CC:   Dysphagia   Subjective: No swallowing issues earlier today.  No nausea or vomiting.  She continues to spit up clear phlegm.  She passed multiple nonbloody brown loose stools today.  No black or bloody stools.  No chest pain or shortness of breath.  No family at the bedside.  Transport arrived to take her into radiology at this time.   Objective:  Vital signs in last 24 hours: Temp:  [97.8 F (36.6 C)-97.9 F (36.6 C)] 97.8 F (36.6 C) (12/31 1134) Pulse Rate:  [69-91] 69 (12/31 1134) Resp:  [15-26] 20 (12/31 1134) BP: (120-175)/(60-111) 120/60 (12/31 1134) SpO2:  [98 %-100 %] 98 % (12/31 1134) Weight:  [40 kg] 40 kg (12/31 0500) Last BM Date : 12/02/23 General: Alert 67 year old female in no acute distress. Heart: Regular rate and rhythm, no murmurs. Pulm: Breath sounds clear, decreased in the bases.  Abdomen: Soft, nondistended.  Nontender.  Positive bowel sounds to all 4 quadrants. Extremities: No edema. Neurologic:  Alert and  oriented x 3.  Speech is clear.  Moves all extremities equally. Psych:  Alert and cooperative. Normal mood and affect.  Intake/Output from previous day: No intake/output data recorded. Intake/Output this shift: No intake/output data recorded.  Lab Results: Recent Labs    11/30/23 1232  WBC 4.5  HGB 10.7*  HCT 34.5*  PLT 173   BMET Recent Labs    11/30/23 1232 12/01/23 0440 12/02/23 0855  NA 137 139 138  K 3.7 3.3* 4.0  CL 109 108 106  CO2 21* 20* 24  GLUCOSE 103* 80 108*  BUN 24* 22 16  CREATININE 1.31* 1.27* 1.13*  CALCIUM  8.3* 8.5* 8.8*   LFT Recent Labs    11/30/23 1232 12/01/23 0440  PROT 5.7*  --   ALBUMIN 2.5* 2.5*  AST 41  --   ALT 45*  --   ALKPHOS 117  --   BILITOT 0.6  --    PT/INR No results for input(s): LABPROT, INR in the last 72 hours. Hepatitis Panel No results for input(s): HEPBSAG, HCVAB, HEPAIGM, HEPBIGM in the last 72 hours.  DG  Swallowing Func-Speech Pathology Result Date: 12/01/2023 Table formatting from the original result was not included. Images from the original result were not included. Modified Barium Swallow Study Patient Details Name: Cynthea Zachman MRN: [id number] Date of Birth: May 16, 1956 Today's Date: 12/01/2023 HPI/PMH: HPI: Valleri Hendricksen is a 67 year old woman who presented with progressive exertional dyspnea, fatigue, and swelling of the lower extremities. Patient states that she hs had increased orthopnea (5 pillows), exertional dyspnea, fatigue and weight loss since her last hospital discharge in October. Seen by GI in [place], but only with colonoscopy. No upper endoscopy or fluoroscopy. CXR 12/29 without focal consolidation, but possible atypiclal   Pt with pertinent past medical history of HFrEF, anemia, hypertension, prior CVA, saccular aneurysms, invasive lobular carcinoma of the breast status post resection, CKD 3A, and poor appetite/weight loss. Clinical Impression: Clinical Impression: Pt presents with grossly normal oropharyngeal swallow function.  There was no penetration or aspiration with any consistencies trialed and pt is protecting airway well.   Esophageal lumen appeared narrowed, but bolus flow was not obstructed.  Esophagus at times patulous below UES.  There are possible cervical osteophytes impinging on esophagus but this did not impede bolus flow.  See image below.  During pill simulation there was no oral or pharyngeal stasis of tablet, but there was retention of tablet in  esophagus.  Liquid wash helped to propel tablet through esophagus.  Pt may benefit from further assessment of esophagus should swallowing difficulty persist. Recommend regular texture diet with thin liquids. Factors that may increase risk of adverse event in presence of aspiration Noe & Lianne 2021): No data recorded Recommendations/Plan: Swallowing Evaluation Recommendations Swallowing Evaluation Recommendations Recommendations: PO  diet PO Diet Recommendation: Regular; Thin liquids (Level 0) Liquid Administration via: Straw; Cup Medication Administration: Whole meds with liquid Supervision: Patient able to self-feed Swallowing strategies  : Small bites/sips; Slow rate Postural changes: Stay upright 30-60 min after meals; Position pt fully upright for meals Oral care recommendations: Oral care BID (2x/day) Recommended consults: Consider GI consultation Treatment Plan Treatment Plan Treatment recommendations: Therapy as outlined in treatment plan below Follow-up recommendations: Follow physicians's recommendations for discharge plan and follow up therapies Treatment frequency: Min 2x/week Treatment duration: 2 weeks Interventions: Diet toleration management by SLP Recommendations Recommendations for follow up therapy are one component of a multi-disciplinary discharge planning process, led by the attending physician.  Recommendations may be updated based on patient status, additional functional criteria and insurance authorization. Assessment: Orofacial Exam: Orofacial Exam Oral Cavity: Oral Hygiene: WFL Oral Cavity - Dentition: Adequate natural dentition Orofacial Anatomy: WFL Oral Motor/Sensory Function: WFL (See BSE) Anatomy: Anatomy: Suspected cervical osteophytes Boluses Administered: Boluses Administered Boluses Administered: Thin liquids (Level 0); Mildly thick liquids (Level 2, nectar thick); Moderately thick liquids (Level 3, honey thick); Solid; Puree  Oral Impairment Domain: Oral Impairment Domain Lip Closure: No labial escape Tongue control during bolus hold: Cohesive bolus between tongue to palatal seal Bolus preparation/mastication: Timely and efficient chewing and mashing Bolus transport/lingual motion: Brisk tongue motion Oral residue: Complete oral clearance Location of oral residue : N/A  Pharyngeal Impairment Domain: Pharyngeal Impairment Domain Soft palate elevation: No bolus between soft palate (SP)/pharyngeal wall (PW)  Laryngeal elevation: Complete superior movement of thyroid cartilage with complete approximation of arytenoids to epiglottic petiole Epiglottic movement: Complete inversion Laryngeal vestibule closure: Complete, no air/contrast in laryngeal vestibule Pharyngeal stripping wave : Present - diminished Pharyngeal contraction (A/P view only): N/A Pharyngoesophageal segment opening: Partial distention/partial duration, partial obstruction of flow Tongue base retraction: Trace column of contrast or air between tongue base and PPW Pharyngeal residue: Complete pharyngeal clearance Location of pharyngeal residue: N/A  Esophageal Impairment Domain: Esophageal Impairment Domain Esophageal clearance upright position: Esophageal retention Pill: Pill Consistency administered: Thin liquids (Level 0) Thin liquids (Level 0): [facility] Penetration/Aspiration Scale Score: Penetration/Aspiration Scale Score 1.  Material does not enter airway: Thin liquids (Level 0); Mildly thick liquids (Level 2, nectar thick); Moderately thick liquids (Level 3, honey thick); Puree; Solid; Pill Compensatory Strategies: Compensatory Strategies Compensatory strategies: No   General Information: Caregiver present: No  Diet Prior to this Study: Regular; Thin liquids (Level 0)   Temperature : Normal   Respiratory Status: WFL   No data recorded  History of Recent Intubation: No  Behavior/Cognition: Alert; Cooperative; Pleasant mood Self-Feeding Abilities: Able to self-feed Baseline vocal quality/speech: Normal No data recorded Volitional Swallow: Able to elicit Exam Limitations: No limitations Goal Planning: Prognosis for improved oropharyngeal function: -- (N/A) No data recorded No data recorded No data recorded Consulted and agree with results and recommendations: Patient Pain: Pain Assessment Pain Assessment: 0-10 Pain Score: 5 Faces Pain Scale: 0 Pain Location: RLE Pain Descriptors / Indicators: Cramping Pain Intervention(s): Monitored during session End of  Session: Start Time:SLP Start Time (ACUTE ONLY): 9043 Stop Time: SLP Stop Time (ACUTE ONLY): 1013 Time Calculation:SLP Time  Calculation (min) (ACUTE ONLY): 17 min Charges: SLP Evaluations $ SLP Speech Visit: 1 Visit SLP Evaluations $BSS Swallow: 1 Procedure SLP visit diagnosis: SLP Visit Diagnosis: Dysphagia, unspecified (R13.10) Past Medical History: Past Medical History: Diagnosis Date  Cataracts, bilateral   Cerebral infarction due to carotid artery stenosis (HCC)   Diabetes mellitus type 2 in nonobese (HCC)   Glaucoma   Hypertension   Hypertensive emergency 11/22/2022  Stroke (HCC)   no residual, series of mini strokes  Tobacco abuse   Vertigo  Past Surgical History: Past Surgical History: Procedure Laterality Date  ABDOMINAL HYSTERECTOMY    CARDIAC DEFIBRILLATOR PLACEMENT    CORONARY STENT INTERVENTION N/A 11/26/2022  Procedure: CORONARY STENT INTERVENTION;  Surgeon: Dann Candyce RAMAN, MD;  Location: [facility]AS[facility];  Service: Cardiovascular;  Laterality: N/A;  gum operation    Implantable loop recorder removal  02/01/2019  MDT Reveal LINQ explantation in office by JA  IR 3D INDEPENDENT WKST  05/07/2017  IR 3D INDEPENDENT WKST  09/17/2023  IR ANGIO INTRA EXTRACRAN SEL COM CAROTID INNOMINATE BILAT MOD SED  04/23/2017  IR ANGIO INTRA EXTRACRAN SEL COM CAROTID INNOMINATE BILAT MOD SED  08/06/2017  IR ANGIO INTRA EXTRACRAN SEL COM CAROTID INNOMINATE UNI R MOD SED  05/07/2017  IR ANGIO INTRA EXTRACRAN SEL INTERNAL CAROTID BILAT MOD SED  09/17/2023  IR ANGIO VERTEBRAL SEL VERTEBRAL BILAT MOD SED  04/23/2017  IR ANGIO VERTEBRAL SEL VERTEBRAL BILAT MOD SED  08/06/2017  IR ANGIOGRAM FOLLOW UP STUDY  05/07/2017  IR ANGIOGRAM SELECTIVE EACH ADDITIONAL VESSEL  05/07/2017  IR RADIOLOGIST EVAL & MGMT  04/16/2017  IR RADIOLOGIST EVAL & MGMT  05/29/2017  IR RADIOLOGIST EVAL & MGMT  09/29/2023  IR RADIOLOGIST EVAL & MGMT  11/05/2023  IR TRANSCATH/EMBOLIZ  05/07/2017  LOOP RECORDER INSERTION N/A 03/28/2017  Procedure: Loop Recorder  Insertion;  Surgeon: Lynwood Rakers, MD;  Location: [facility];  Service: Cardiovascular;  Laterality: N/A;  MASTECTOMY W/ SENTINEL NODE BIOPSY Right 06/25/2022  Procedure: RIGHT MASTECTOMY WITH AXILLARY SENTINEL LYMPH NODE BIOPSY;  Surgeon: Belinda Cough, MD;  Location: [facility];  Service: General;  Laterality: Right;  RADIOLOGY WITH ANESTHESIA N/A 05/07/2017  Procedure: EMBOLIZATION;  Surgeon: Dolphus Carrion, MD;  Location: [facility];  Service: Radiology;  Laterality: N/A;  RADIOLOGY WITH ANESTHESIA N/A 08/06/2017  Procedure: RADIOLOGY WITH ANESTHESIA STENTING;  Surgeon: Dolphus Carrion, MD;  Location: [facility];  Service: Radiology;  Laterality: N/A;  RIGHT/LEFT HEART CATH AND CORONARY ANGIOGRAPHY N/A 11/26/2022  Procedure: RIGHT/LEFT HEART CATH AND CORONARY ANGIOGRAPHY;  Surgeon: Dann Candyce RAMAN, MD;  Location: [facility] INVASIVE CV LAB;  Service: Cardiovascular;  Laterality: N/A;  TEE WITHOUT CARDIOVERSION N/A 03/28/2017  Procedure: TRANSESOPHAGEAL ECHOCARDIOGRAM (TEE);  Surgeon: Redell RAMAN Shallow, MD;  Location: [facility] ENDOSCOPY;  Service: Cardiovascular;  Laterality: N/A; Anette FORBES Grippe, MA, CCC-SLP Acute Rehabilitation Services Office: [phone] 12/01/2023, 1:36 PM  VAS US  LOWER EXTREMITY VENOUS (DVT) (ONLY [facility]) Result Date: 12/01/2023  Lower Venous DVT Study Patient Name:  JERITA WIMBUSH  Date of Exam:   11/30/2023 Medical Rec #: [id number]      Accession #:    [id number] Date of Birth: 09-18-1956       Patient Gender: F Patient Age:   32 years Exam Location:  [facility] ([place]) Procedure:      VAS US  LOWER EXTREMITY VENOUS (DVT) Referring Phys: RILEY RANSOM --------------------------------------------------------------------------------  Indications: Increased leg swelling and SOB since 11/28/23. Patient with heart failure but medicinal non compliance.  Comparison Study: No prior study on file Performing Technologist: Alberta Lis RVS  Examination Guidelines: A complete evaluation  includes B-mode imaging, spectral Doppler, color Doppler, and power Doppler as needed of all accessible portions of each vessel. Bilateral testing is considered an integral part of a complete examination. Limited examinations for reoccurring indications may be performed as noted. The reflux portion of the exam is performed with the patient in reverse Trendelenburg.  +-----+---------------+---------+-----------+------------------+--------------+ RIGHTCompressibilityPhasicitySpontaneityProperties        Thrombus Aging +-----+---------------+---------+-----------+------------------+--------------+ CFV  Full           Yes      No         pulsatile waveform               +-----+---------------+---------+-----------+------------------+--------------+   +--------+---------------+---------+-----------+----------------+-------------+ LEFT    CompressibilityPhasicitySpontaneityProperties      Thrombus                                                                 Aging         +--------+---------------+---------+-----------+----------------+-------------+ CFV     Full           Yes      No         pulsatile                                                                waveform                      +--------+---------------+---------+-----------+----------------+-------------+ SFJ     Full                                                             +--------+---------------+---------+-----------+----------------+-------------+ FV Prox Full                                                             +--------+---------------+---------+-----------+----------------+-------------+ FV Mid  Full                                                             +--------+---------------+---------+-----------+----------------+-------------+ FV      Full  Distal                                                                    +--------+---------------+---------+-----------+----------------+-------------+ PFV     Full                                                             +--------+---------------+---------+-----------+----------------+-------------+ POP     Full           Yes      No         pulsatile                                                                waveform                      +--------+---------------+---------+-----------+----------------+-------------+ PTV     Full                                                             +--------+---------------+---------+-----------+----------------+-------------+ PERO    Full                                                             +--------+---------------+---------+-----------+----------------+-------------+     Summary: RIGHT: - No evidence of common femoral vein obstruction.  pulsatile waveforms consistent with fluid overload  LEFT: - There is no evidence of deep vein thrombosis in the lower extremity.  - No cystic structure found in the popliteal fossa. Pulsatile waveforms consistent with fluid overload.  *See table(s) above for measurements and observations. Electronically signed by Norman Serve on 12/01/2023 at 10:56:55 AM.    Final    CT ABDOMEN PELVIS WO CONTRAST Result Date: 11/30/2023 CLINICAL DATA:  Increasing short of breath and lower extremity edema, mucous in stools, loss of appetite, constipation EXAM: CT ABDOMEN AND PELVIS WITHOUT CONTRAST TECHNIQUE: Multidetector CT imaging of the abdomen and pelvis was performed following the standard protocol without IV contrast. Unenhanced CT was performed per clinician order. Lack of IV contrast limits sensitivity and specificity, especially for evaluation of abdominal/pelvic solid viscera. RADIATION DOSE REDUCTION: This exam was performed according to the departmental dose-optimization program which includes automated exposure control, adjustment of the mA  and/or kV according to patient size and/or use of iterative reconstruction technique. COMPARISON:  11/30/2023, 09/12/2023 FINDINGS: Lower chest: The heart is enlarged with small pericardial effusion. There are bilateral pleural effusions, right greater than left. Emphysema, with bibasilar atelectasis.  Hepatobiliary: Unremarkable unenhanced appearance of the liver and gallbladder. Pancreas: Unremarkable unenhanced appearance. Spleen: Unremarkable unenhanced appearance. Adrenals/Urinary Tract: No urinary tract calculi or obstructive uropathy. Stable proteinaceous left renal cyst and simple right renal cyst. No specific imaging follow-up is required. The adrenals and bladder are unremarkable. Stomach/Bowel: No bowel obstruction or ileus. Normal retrocecal appendix. Diffuse colonic diverticulosis without evidence of acute diverticulitis. No bowel wall thickening or inflammatory change. Minimal retained stool within the proximal colon. Vascular/Lymphatic: Aortic atherosclerosis. No enlarged abdominal or pelvic lymph nodes. Reproductive: Status post hysterectomy. No adnexal masses. Other: No free fluid or free intraperitoneal gas. No abdominal wall hernia. Musculoskeletal: No acute or destructive bony abnormalities. Reconstructed images demonstrate no additional findings. IMPRESSION: 1. Cardiomegaly with small pericardial effusion. 2. Small bilateral pleural effusions, right greater than left, with bibasilar atelectasis. 3. Diffuse colonic diverticulosis without diverticulitis. 4. No bowel obstruction or ileus. Minimal retained stool within the proximal colon. 5. Aortic Atherosclerosis (ICD10-I70.0) and Emphysema (ICD10-J43.9). Electronically Signed   By: Ozell Daring M.D.   On: 11/30/2023 20:59   Patient Profile: Masako Overall is a 67 y.o. female with a past medical history breast cancer s/p right mastectomy 06/2022, hypertension, coronary artery disease s/p stent 11/2022, ischemic cardiomyopathy with LVEF 30 - 35%,  acute CVA 09/15/2023 on Brilinta  and ASA, diabetes mellitus type 2 and colon polyps. Admitted 11/30/2023 with SOB, LE edema with progressive weight loss and dysphagia.   Assessment / Plan:  67 year old female admitted to the hospital secondary to DOE/SOB with progressive lower extremity edema and acute on chronic heart failure. Received Furosemide  20 mg IV x 2  in the ED. CTAP without contrast showed cardiomegaly with a small pericardial effusion, small bilateral pleural effusions R > L.  LVEF 30 to 35% per ECHO 10/2023. -Management per the medical service   Dysphagia, started approximately one week s/p acute CVA 09/2023.  Modified barium swallow study showed normal oropharyngeal swallow function without aspiration, esophageal lumen appeared narrowed but bolus flow was not obstructed, possible cervical osteophytes noted but unlikely contributing factor and there was brief pill retention in the esophagus which passed after liquid wash. Patient felt swallowing improved after she was diuresed in the ED 12/30.  Patient en route to radiology for barium swallow study. -Endoscopic evaluation deferred for now -Await barium swallow study results  -Regular texture diet with thin liquids per speech pathologist   Progressive weight loss since diagnosis of breast cancer status post mastectomy 06/2022.  CTAP without contrast findings did not explain etiology for weight loss.   Right MCA CVA 09/15/2023 on Atorvastatin , ASA and Brilinta .   Normocytic anemia.  Hemoglobin 11.2 -> 10.7. No overt GI bleeding.    History of colon polyps -Patient to follow-up in our outpatient GI clinic to determine appropriate timing of colonoscopy in setting of recent CVA 09/2023, appointment previously scheduled with Dr. San at 10:40 AM on 01/06/2024.   Constipation -Miralax  Q HS PRN   DM type II  Principal Problem:   Dysphagia Active Problems:   Hypertension   Unintentional weight loss   Hyperlipidemia   Severe  protein-calorie malnutrition (HCC)   History of stroke   Acute on chronic HFrEF (heart failure with reduced ejection fraction) (HCC)   Failure to thrive in adult   Acute exacerbation of CHF (congestive heart failure) (HCC)   Antiplatelet or antithrombotic long-term use     LOS: 1 day   Elida CHRISTELLA Shawl  12/02/2023, 2:09PM

## 2023-12-02 NOTE — Progress Notes (Signed)
 Mobility Specialist Progress Note:   12/02/23 1046  Mobility  Activity Ambulated independently in hallway  Level of Assistance Independent after set-up  Assistive Device None  Distance Ambulated (ft) 225 ft  Activity Response Tolerated well  Mobility Referral Yes  Mobility visit 1 Mobility  Mobility Specialist Start Time (ACUTE ONLY) 0955  Mobility Specialist Stop Time (ACUTE ONLY) 1005  Mobility Specialist Time Calculation (min) (ACUTE ONLY) 10 min   Pt received walking out of BR, agreeable to mobility with encouragement. No physical assistance needed for mobility. VSS. Asx throughout. Pt returned to room with all needs met.   Amy Howell  Mobility Specialist Please contact via [employer] office at [phone]

## 2023-12-02 NOTE — Progress Notes (Addendum)
 Initial Nutrition Assessment  DOCUMENTATION CODES:   Severe malnutrition in context of chronic illness  INTERVENTION:  - Ensure Enlive po BID, each supplement provides 350 kcal and 20 grams of protein.  - Liberalize diet to regular  -Calorie Count to assess intake -Continue bowel regimen -Add MVI -Thiamine  100mg  daily x5 days  NUTRITION DIAGNOSIS:  Severe Malnutrition related to chronic illness as evidenced by severe fat depletion, severe muscle depletion.  GOAL:  Patient will meet greater than or equal to 90% of their needs  MONITOR:  PO intake, Supplement acceptance, Labs, Weight trends  REASON FOR ASSESSMENT:  Consult Calorie Count, Poor PO, Assessment of nutrition requirement/status  ASSESSMENT:  67 y.o. female presents to ED c/o increased SOB x 2 days. Found to have dx of dysphagia, severe protein calorie malnutrition. PMH: HTN, HLD, T2DM, CKD III, dilated ischemic cardiomyopathy (EF 30-35%), breast CA w/ mastectomy 06/2022. Hospitalized back in October 2024 w/ acute on chronic HFrEF exacerbation where she was diuresed. 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 Endorses increased difficulty with swallowing.   Endorses poor appetite since hospital admission in October 2024 where she suffered a stroke. Reports food getting stuck in her throat that is better today. SLP signed off on regular texture, thin liquids. MBS ordered/pending. Says she's always been a picky eater. She does not eat pork and has become suspicious of beef recently, as pork is often added to it.   24 Hour Recall: B: tuna, boiled egg, onion w/ crackers and turkey sausage L: skips D: chicken sandwich and french fries Snacks: chips, pretzels, crackers  Was up and down all night with multiple BMs. Responded well to 1 dose of IV lasix  (20mg ). Per Internal Medicine in office visit on 12/19, she has been prescribed mirtazipine in the past and was told to increase to 15mg   prior to that appointment. Patient stated to provider, at that time, that she noticed no significant difference. Reports she takes Ensure daily. Per hospital attending, she did not like the way the medicine made her feel and has not been taking currently.  Endorses she's always been in the 90s, when it comes to her weight. Does not report a reduction in weight to this writer, but did to SLP, per 12/30 note.  Admission Weight: 37.2 kg (appears self-reported) Current Weight: 40 kg  She is a refeeding risk as reported intake cannot be verified. Therefore, will order PHOS, K+ and Magnesium  lab as well as thiamine  supplementation.  Labs:  K+ 3.3 BNP 2,352.4 B12 1,193 Hgb A1c 5.6 (10/15) CBGs 80-108 daily x3 days  Meds: aspirin , carvedilol , Miralax    NUTRITION - FOCUSED PHYSICAL EXAM:  Flowsheet Row Most Recent Value  Orbital Region Moderate depletion  Upper Arm Region Severe depletion  Thoracic and Lumbar Region Severe depletion  Buccal Region Severe depletion  Temple Region Severe depletion  Clavicle Bone Region Severe depletion  Clavicle and Acromion Bone Region Severe depletion  Scapular Bone Region Severe depletion  Dorsal Hand Severe depletion  Patellar Region Severe depletion  Anterior Thigh Region Severe depletion  Posterior Calf Region Unable to assess  [Mild edema to BLEs]  Edema (RD Assessment) Mild  Hair Reviewed  Eyes Reviewed  Mouth Reviewed  Skin Reviewed  Nails Reviewed     Diet Order:   Diet Order             Diet Heart Room service appropriate? No; Fluid consistency: Thin  Diet effective now  EDUCATION NEEDS:   Education needs have been addressed  Skin:  Skin Assessment: Reviewed RN Assessment  Last BM:  12/31  Height:  Ht Readings from Last 1 Encounters:  11/30/23 5' (1.524 m)    Weight:  Wt Readings from Last 1 Encounters:  12/02/23 40 kg    Ideal Body Weight:  45.5 kg  BMI:  Body mass index is 17.22  kg/m.  Estimated Nutritional Needs:   Kcal:  1400-1600 kcal  Protein:  50-60 g  Fluid:  >1.5L  Blair Deaner MS, RD, LDN Registered Dietitian Clinical Nutrition RD Inpatient Contact Info in Amion

## 2023-12-02 NOTE — Progress Notes (Signed)
                 Interval history Doing okay this morning.  Sitting in chair having breakfast.  Swallowing feels much better.  Was having some loose stool overnight.  Physical exam Blood pressure 120/60, pulse 69, temperature 97.8 F (36.6 C), temperature source Oral, resp. rate 20, height 5' (1.524 m), weight 40 kg, SpO2 98%.  Alert and oriented, No distress, cachexia HR regular no mrg, some mild lower extremity edema Breathing comfortably on room air, no crackles  Vitals:   12/01/23 1500 12/01/23 1605 12/01/23 1630 12/01/23 1700  BP: 138/88 (!) 158/74 (!) 175/93 (!) 157/90   12/01/23 1730 12/01/23 1800 12/01/23 1852 12/01/23 1924  BP: (!) 129/111 (!) 161/81 (!) 172/74 (!) 174/103   12/01/23 2320 12/02/23 0406 12/02/23 0754 12/02/23 1134  BP: (!) 167/79 (!) 170/88 (!) 170/71 120/60    Assessment and plan Hospital day 1  Amy Howell is a 67 y.o. dilated ischemic cardiomyopathy with reduced ejection fraction, pericardial effusion, history of right MCA cerebral infarction, dysphagia, and unintentional weight loss with malnutrition.  Dysphagia Swallowing is better today.  Modified barium swallow study with speech therapy did not show any oropharyngeal dysfunction.  Query some narrowing of esophageal lumen.  Note order for barium esophagram, placed by GI.  Appreciate their insight in this case.  Tolerating diet well, continue for now.  Weight loss, BMI 16 Severe protein calorie malnutrition Eating breakfast this morning.  Registered dietitian has been consulted for help with calorie counting.  Continue supplementation with protein shakes.  Acute on chronic heart failure with reduced ejection fraction Mild exacerbation, cannot ascertain dry weight due to ongoing weight loss.  Some lower extremity edema but no other signs of acute heart failure today.  Holding further diuretics because of loose stool overnight.  ASCVD Complicated by coronary artery disease status post PCI to RCA  and right MCA cerebral infarction.  Stable.  Continue aspirin  and ticagrelor .  Not adherent to statin.  Hypertension Poor adherence to medical therapy.  Is amenable to Coreg , but no other GDMT at present. - Coreg  12.5 mg twice daily  Diet: Heart healthy IVF: N/A VTE: Place and maintain sequential compression device Start: 11/30/23 2335, pharmacologic prophylaxis held in case of procedure. Code: Full PT/OT recommendations: N/A TOC recommendations: N/A Family Update: N/A  Discharge plan: Pending dysphagia workup.  Amy Mallard, MD  PGY-1

## 2023-12-03 DIAGNOSIS — I509 Heart failure, unspecified: Secondary | ICD-10-CM | POA: Diagnosis not present

## 2023-12-03 DIAGNOSIS — R1314 Dysphagia, pharyngoesophageal phase: Secondary | ICD-10-CM | POA: Diagnosis not present

## 2023-12-03 LAB — GASTROINTESTINAL PANEL BY PCR, STOOL (REPLACES STOOL CULTURE)

## 2023-12-03 MED ORDER — CARVEDILOL 12.5 MG PO TABS
12.5000 mg | ORAL_TABLET | Freq: Two times a day (BID) | ORAL | 0 refills | Status: DC
  Filled 2023-12-03: qty 30, 15d supply, fill #0

## 2023-12-03 MED ORDER — VITAMIN B-1 100 MG PO TABS
100.0000 mg | ORAL_TABLET | Freq: Every day | ORAL | 0 refills | Status: DC
  Filled 2023-12-03: qty 30, 30d supply, fill #0

## 2023-12-03 MED ORDER — POLYETHYLENE GLYCOL 3350 17 G PO PACK
17.0000 g | PACK | Freq: Every day | ORAL | 0 refills | Status: DC
  Filled 2023-12-03: qty 14, 14d supply, fill #0

## 2023-12-03 NOTE — Plan of Care (Signed)
  Problem: Education: Goal: Knowledge of General Education information will improve Description: Including pain rating scale, medication(s)/side effects and non-pharmacologic comfort measures Outcome: Adequate for Discharge   Problem: Health Behavior/Discharge Planning: Goal: Ability to manage health-related needs will improve Outcome: Adequate for Discharge   Problem: Clinical Measurements: Goal: Ability to maintain clinical measurements within normal limits will improve Outcome: Adequate for Discharge Goal: Will remain free from infection Outcome: Adequate for Discharge Goal: Diagnostic test results will improve Outcome: Adequate for Discharge Goal: Respiratory complications will improve Outcome: Adequate for Discharge Goal: Cardiovascular complication will be avoided Outcome: Adequate for Discharge   Problem: Activity: Goal: Risk for activity intolerance will decrease Outcome: Adequate for Discharge   Problem: Nutrition: Goal: Adequate nutrition will be maintained Outcome: Adequate for Discharge   Problem: Coping: Goal: Level of anxiety will decrease Outcome: Adequate for Discharge   Problem: Elimination: Goal: Will not experience complications related to urinary retention Outcome: Adequate for Discharge   Problem: Skin Integrity: Goal: Risk for impaired skin integrity will decrease Outcome: Adequate for Discharge   Problem: Education: Goal: Knowledge of General Education information will improve Description: Including pain rating scale, medication(s)/side effects and non-pharmacologic comfort measures Outcome: Adequate for Discharge   Problem: Health Behavior/Discharge Planning: Goal: Ability to manage health-related needs will improve Outcome: Adequate for Discharge   Problem: Clinical Measurements: Goal: Ability to maintain clinical measurements within normal limits will improve Outcome: Adequate for Discharge Goal: Will remain free from infection Outcome:  Adequate for Discharge Goal: Diagnostic test results will improve Outcome: Adequate for Discharge Goal: Respiratory complications will improve Outcome: Adequate for Discharge Goal: Cardiovascular complication will be avoided Outcome: Adequate for Discharge   Problem: Activity: Goal: Risk for activity intolerance will decrease Outcome: Adequate for Discharge   Problem: Nutrition: Goal: Adequate nutrition will be maintained Outcome: Adequate for Discharge   Problem: Coping: Goal: Level of anxiety will decrease Outcome: Adequate for Discharge   Problem: Elimination: Goal: Will not experience complications related to bowel motility Outcome: Adequate for Discharge Goal: Will not experience complications related to urinary retention Outcome: Adequate for Discharge   Problem: Pain Management: Goal: General experience of comfort will improve Outcome: Adequate for Discharge   Problem: Safety: Goal: Ability to remain free from injury will improve Outcome: Adequate for Discharge   Problem: Skin Integrity: Goal: Risk for impaired skin integrity will decrease Outcome: Adequate for Discharge   Problem: Increased Nutrient Needs (NI-5.1) Goal: Food and/or nutrient delivery Description: Individualized approach for food/nutrient provision. Outcome: Adequate for Discharge

## 2023-12-03 NOTE — Discharge Instructions (Addendum)
 Dear Ms. Mccullum,   Thank you for allowing us  to take care of you today. You were in the hospital for heart failure exacerbation your shortness of breath improved after you were given diuretics that made you pee.  Continue to monitor your symptoms for swelling and shortness of breath. You will need to follow up with Cardiology in the next few weeks. You are on Coreg  12.5 mg twice a day (you have tolerated this dose here in the hospital), aspirin , and Brillinta. Coreg  regulates your blood pressure and helps you with your heart failure. Given your history of aneurysms in your brain, it is important that you control your blood pressure too to prevent these from rupturing.   -Follow up closely with cardiology  -Continue taking aspirin , Brillinta and Coreg  -IV iron (referral sent, please follow with our office if you have not heard from them yet).   For your difficulty swallowing:  You saw gastroenterology and they do want you to follow with them outpatient. You should be seen in the next couple of weeks. Please call their office if you have not heard from them with regards to your appointment.   For your constipation:  If you do become constipated, you can take Miralax  daily until your stools become regular and then take it as needed.    Please follow with oncology with regards to your breast cancer. Follow up with Neurology for your stroke and brain aneurysms.  I have reached out to our office to schedule a hospital follow up with us , please give us  a call if you do not hear back from us .   Please do not hesitate to call our office at [phone] if you have any questions or concerns.   Sincerely,  [name], MD  PGY-1

## 2023-12-03 NOTE — Progress Notes (Signed)
 Discharge instructions (including medications) discussed with and copy provided to patient/caregiver

## 2023-12-03 NOTE — Discharge Summary (Addendum)
 Name: Amy Howell MRN: [id number] DOB: Jun 03, 1956 68 y.o. PCP: Elicia Sharper, DO  Date of Admission: 11/30/2023 11:58 AM Date of Discharge:  12/03/2023 Attending Physician: Dr. Forest  DISCHARGE DIAGNOSIS:  Primary Problem: Dysphagia   Hospital Problems: Principal Problem:   Dysphagia Active Problems:   Hypertension   Unintentional weight loss   Hyperlipidemia   Severe protein-calorie malnutrition (HCC)   History of stroke   Acute on chronic HFrEF (heart failure with reduced ejection fraction) (HCC)   Failure to thrive in adult   Acute exacerbation of CHF (congestive heart failure) (HCC)   Antiplatelet or antithrombotic long-term use    DISCHARGE MEDICATIONS:   Allergies as of 12/03/2023       Reactions   Bidil  [isosorb Dinitrate-hydralazine ] Other (See Comments)   Caused a stroke   Lisinopril  Other (See Comments)   Angioedema   Pork-derived Products Hives   Rosuvastatin  Other (See Comments)   headache   Statins Other (See Comments)   Headaches and swelling        Medication List     TAKE these medications    aspirin  81 MG chewable tablet Chew 1 tablet (81 mg total) by mouth daily.   Brilinta  90 MG Tabs tablet Generic drug: ticagrelor  Take 1 tablet (90 mg total) by mouth 2 (two) times daily.   carvedilol  12.5 MG tablet Commonly known as: COREG  Take 1 tablet (12.5 mg total) by mouth 2 (two) times daily with a meal. What changed:  medication strength how much to take when to take this   dorzolamide  2 % ophthalmic solution Commonly known as: TRUSOPT  Instill 1 drop into both eyes twice a day   feeding supplement Liqd Take 237 mLs by mouth 3 (three) times daily between meals.   latanoprost  0.005 % ophthalmic solution Commonly known as: XALATAN  Instill 1 drop into both eyes at bedtime   multivitamin with minerals Tabs tablet Take 1 tablet by mouth daily.   polyethylene glycol 17 g packet Commonly known as: MIRALAX  / GLYCOLAX  Take 17 g by  mouth daily.   thiamine  100 MG tablet Commonly known as: Vitamin B-1 Take 1 tablet (100 mg total) by mouth daily.   timolol  0.5 % ophthalmic solution Commonly known as: TIMOPTIC  Place 1 drop into both eyes every morning.        DISPOSITION AND FOLLOW-UP:  Amy Howell was discharged from [facility] in Stable condition. At the hospital follow up visit please address:  Follow-up Recommendations: Consults: GI (also need follow up with Cardio, Neuro, and Oncology from prior hospitalizations) Labs: Basic Metabolic Profile and CBC Medications: Coreg  increased to 12.5mg  BID, Referral for ambulatory IV iron for IDA.  Follow-up Appointments:  Follow-up Information     Elicia Sharper, DO. Schedule an appointment as soon as possible for a visit today.   Specialty: Internal Medicine Why: You should be getting a phone call from our office, but if you do not hear back, please call our office to schedule a hospital follow up visit. Contact information: [address] [place] [address] [phone]         Loretha Ash, MD. Schedule an appointment as soon as possible for a visit.   Specialty: Hematology and Oncology Why: to follow up on your breast cancer Contact information: [address] [place] [address] [phone]         Alvan Ronal BRAVO, MD Follow up.   Specialty: Cardiology Why: To follow up on your heart failure Contact information:  [address] [place] [address] [phone]         [place] V, DO. Schedule an appointment as soon as possible for a visit.   Specialty: Gastroenterology Why: To follow up with GI Contact information: [address] [place] [address] [phone]                 HOSPITAL COURSE:  Patient Summary: Amy Howell is a 68 y.o. with a PMH significant for HFrEF, invasive lobular carcinoma of the breast s/p resection, refusing chemotherapy, cachexia, anemia,  hypertension, CVA, saccular aneurysms,CKD 3A, medication non-adherence who presented with worsening exertional dyspnea, fatigue, swelling, admitted for CHF exacerbation.   #HFrEF exacerbation #CAD s/p PCI to the mid RCA  BNP was 2,352 at admission was dyspneic and had bilateral lower leg edema. S/p lasix  20mg  IV twice. Dyspnea improved, saturating well at room air. Cr at baseline. Outpatient she was prescribed spironolactone  12.5 daily, Coreg  6.25 twice daily.  Declined rest of GDMT at cardiology appointment.  She was also prescribed Lipitor 40 for her CAD but she is not taking at this time because it gives her paresthesias. She is taking twice daily Brilinta  as well as daily baby aspirin  and Coreg  6.25mg  BID.   -FU with cardiology outpatient  -cw Coreg  6.25mg  BID  -Cw ASA 81mg  daily  -Cw Brillinta 90mg  BID   #Normocytic anemia Planned colonoscopy in February. Tsat is 11%. Ferritin is 90. Will benefit from taking iron supplementation. Folate WNL. B12 at 1.193. Treating her iron deficiency will help her with her CHF. She also has Stage CKD 3a. Constipation would make oral iron intolerable as well. She would benefit from having IV iron infusions.   -IV iron OP   #Dysphagia Endorsed dysphagia at admission that was worse with liquids than solids. Improved after diuresis. GI and SLP was consulted given history of malignancy for consideration of an EGD. She has NO high grade stricture or stenosis on barium swallow. Given her improvement on her dysphagia after diuresis and risks of EGD outweighed benefits given that patient is on brillinta and has had a recent CVA, EGD was not recommended. However she does have mild dysmotility and retained the pill in her GEJ that she was then able to pass with water. GI recommended FU OP for reassessment and reconsideration of EGD.   -FU with GI outpatient in the next couple of weeks.    #Loose stools The day after her admission she complained of loose stools. She  had a similar complaint on her last admission. Elastase was WNL. Fecal calprotectin and GI panel was collected.  Towards the end of her admission her stools normalized. She currently has no abdominal pain and no longer has loose stools. She is also constipated, which could be contributing to her loose stools.  -Fecal calprotectin and GI panel studies pending  #History of Constipation  Had ongoing constipation at admission stating pellet like stools and small in size. Could also be related to decreased PO intake. No large stool burden on CT at admission.  She states she did get some relief bowel regiment of MiraLAX  and senna in the past. She also finds that chocolate pudding helps her.   -cw MiraLAX    #CKD 3A Creatinine at baseline, GFR may be overestimated in this very thin patient with elevated creatinine. -CTM OP   #Hypoalbuminemia #Poor appetite #Underweight Was prescribed mirtazapine  for appetite, but stopped taking this due to drowsiness. Hypoalbuminemia in setting of nutritional deficiency. Dry weight  likely lower than current weight in setting of volume overload. Discharged on 90lbs.   -Cw Ensure TID    #Invasive lobular carcinoma of the breast s/p resection  #Pulmonary nodule #Pericardial effusion Was prescribed anastrazole but patient did discontinued it on her own because it made her drowsy. She needed for follow up with Oncology but has not followed up with them yet. She is still losing weight and is cachectic. No obvious signs of metastasis on non-contrast CT.   - FU with oncology  -Please consider PET CT for her  -Please discuss risks and benefits of obtaining surveillance of the right solid pulmonary nodule noted during her October hospitalization   #Emphysema On imaging, and history of white sputum production.  Denies chronic cough.  No formal PFTs Plan: - Outpatient follow-up, consider PFTs and inhalers.   DISCHARGE INSTRUCTIONS:   Discharge Instructions      (HEART FAILURE PATIENTS) Call MD:  Anytime you have any of the following symptoms: 1) 3 pound weight gain in 24 hours or 5 pounds in 1 week 2) shortness of breath, with or without a dry hacking cough 3) swelling in the hands, feet or stomach 4) if you have to sleep on extra pillows at night in order to breathe.   Complete by: As directed    Amb Referral to Intravenous Iron Therapy   Complete by: As directed    You have been referred to [facility] Infusion team for IV Iron Infusions. The infusion pharmacy team will reach out to you with appointment information.    Call MD for:  difficulty breathing, headache or visual disturbances   Complete by: As directed    Call MD for:  extreme fatigue   Complete by: As directed    Call MD for:  hives   Complete by: As directed    Call MD for:  persistant dizziness or light-headedness   Complete by: As directed    Call MD for:  persistant nausea and vomiting   Complete by: As directed    Call MD for:  redness, tenderness, or signs of infection (pain, swelling, redness, odor or green/yellow discharge around incision site)   Complete by: As directed    Call MD for:  severe uncontrolled pain   Complete by: As directed    Call MD for:  temperature >100.4   Complete by: As directed    Diet - low sodium heart healthy   Complete by: As directed    Discharge instructions   Complete by: As directed    Amy Howell,   Thank you for allowing us  to take care of you today. You were in the hospital for heart failure exacerbation your shortness of breath improved after you were given diuretics that made you pee.  Continue to monitor your symptoms for swelling and shortness of breath. You will need to follow up with Cardiology in the next few weeks. You are on Coreg  12.5 mg twice a day (you have tolerated this dose here in the hospital), aspirin , and Brillinta. Coreg  regulates your blood pressure and helps you with your heart failure. Given your history of  aneurysms in your brain, it is important that you control your blood pressure too to prevent these from rupturing.   -Follow up closely with cardiology  -Continue taking aspirin , Brillinta and Coreg  -IV iron (referral sent, please follow with our office if you have not heard from them yet).   For your difficulty swallowing:  You saw gastroenterology and they  do want you to follow with them outpatient. You should be seen in the next couple of weeks. Please call their office if you have not heard from them with regards to your appointment.   For your constipation:  If you do become constipated, you can take Miralax  daily until your stools become regular and then take it as needed.    Please follow with oncology with regards to your breast cancer. Follow up with Neurology for your stroke and brain aneurysms.  I have reached out to our office to schedule a hospital follow up with us , please give us  a call if you do not hear back from us .   Please do not hesitate to call our office at [phone] if you have any questions or concerns.   Sincerely,  [name], MD  PGY-1   Increase activity slowly   Complete by: As directed        SUBJECTIVE:   Feeling better, no more dyspnea or diarrhea.   Discharge Vitals:   BP (!) 158/69 (BP Location: Left Arm)   Pulse 89   Temp 97.8 F (36.6 C) (Oral)   Resp 20   Ht 5' (1.524 m)   Wt 41 kg   SpO2 100%   BMI 17.64 kg/m   OBJECTIVE:  Physical Exam Constitutional:      General: She is not in acute distress.    Appearance: She is not ill-appearing.  Cardiovascular:     Rate and Rhythm: Normal rate and regular rhythm.  Pulmonary:     Effort: Pulmonary effort is normal.     Breath sounds: Normal breath sounds. No decreased breath sounds, wheezing, rhonchi or rales.  Abdominal:     Palpations: Abdomen is soft.     Tenderness: There is no abdominal tenderness.  Musculoskeletal:     Right lower leg: No edema.     Left  lower leg: No edema.  Skin:    General: Skin is warm and dry.  Neurological:     Mental Status: She is alert and oriented to person, place, and time.     Pertinent Labs, Studies, and Procedures:     Latest Ref Rng & Units 11/30/2023   12:32 PM 09/18/2023    5:23 AM 09/17/2023    5:39 AM  CBC  WBC 4.0 - 10.5 K/uL 4.5  5.9  4.5   Hemoglobin 12.0 - 15.0 g/dL 89.2  88.7  87.2   Hematocrit 36.0 - 46.0 % 34.5  34.8  39.5   Platelets 150 - 400 K/uL 173  193  217        Latest Ref Rng & Units 12/02/2023    8:55 AM 12/01/2023    4:40 AM 11/30/2023   12:32 PM  CMP  Glucose 70 - 99 mg/dL 891  80  896   BUN 8 - 23 mg/dL 16  22  24    Creatinine 0.44 - 1.00 mg/dL 8.86  8.72  8.68   Sodium 135 - 145 mmol/L 138  139  137   Potassium 3.5 - 5.1 mmol/L 4.0  3.3  3.7   Chloride 98 - 111 mmol/L 106  108  109   CO2 22 - 32 mmol/L 24  20  21    Calcium  8.9 - 10.3 mg/dL 8.8  8.5  8.3   Total Protein 6.5 - 8.1 g/dL   5.7   Total Bilirubin <1.2 mg/dL   0.6   Alkaline Phos 38 - 126 U/L   117  AST 15 - 41 U/L   41   ALT 0 - 44 U/L   45     DG Swallowing Func-Speech Pathology Result Date: 12/01/2023 Table formatting from the original result was not included. Images from the original result were not included. Modified Barium Swallow Study Patient Details Name: Amy Howell MRN: [id number] Date of Birth: 1956/06/23 Today's Date: 12/01/2023 HPI/PMH: HPI: Amy Howell is a 68 year old woman who presented with progressive exertional dyspnea, fatigue, and swelling of the lower extremities. Patient states that she hs had increased orthopnea (5 pillows), exertional dyspnea, fatigue and weight loss since her last hospital discharge in October. Seen by GI in [place], but only with colonoscopy. No upper endoscopy or fluoroscopy. CXR 12/29 without focal consolidation, but possible atypiclal   Pt with pertinent past medical history of HFrEF, anemia, hypertension, prior CVA, saccular aneurysms, invasive lobular carcinoma  of the breast status post resection, CKD 3A, and poor appetite/weight loss. Clinical Impression: Clinical Impression: Pt presents with grossly normal oropharyngeal swallow function.  There was no penetration or aspiration with any consistencies trialed and pt is protecting airway well.   Esophageal lumen appeared narrowed, but bolus flow was not obstructed.  Esophagus at times patulous below UES.  There are possible cervical osteophytes impinging on esophagus but this did not impede bolus flow.  See image below.  During pill simulation there was no oral or pharyngeal stasis of tablet, but there was retention of tablet in esophagus.  Liquid wash helped to propel tablet through esophagus.  Pt may benefit from further assessment of esophagus should swallowing difficulty persist. Recommend regular texture diet with thin liquids. Factors that may increase risk of adverse event in presence of aspiration Noe & Lianne 2021): No data recorded Recommendations/Plan: Swallowing Evaluation Recommendations Swallowing Evaluation Recommendations Recommendations: PO diet PO Diet Recommendation: Regular; Thin liquids (Level 0) Liquid Administration via: Straw; Cup Medication Administration: Whole meds with liquid Supervision: Patient able to self-feed Swallowing strategies  : Small bites/sips; Slow rate Postural changes: Stay upright 30-60 min after meals; Position pt fully upright for meals Oral care recommendations: Oral care BID (2x/day) Recommended consults: Consider GI consultation Treatment Plan Treatment Plan Treatment recommendations: Therapy as outlined in treatment plan below Follow-up recommendations: Follow physicians's recommendations for discharge plan and follow up therapies Treatment frequency: Min 2x/week Treatment duration: 2 weeks Interventions: Diet toleration management by SLP Recommendations Recommendations for follow up therapy are one component of a multi-disciplinary discharge planning process, led by  the attending physician.  Recommendations may be updated based on patient status, additional functional criteria and insurance authorization. Assessment: Orofacial Exam: Orofacial Exam Oral Cavity: Oral Hygiene: WFL Oral Cavity - Dentition: Adequate natural dentition Orofacial Anatomy: WFL Oral Motor/Sensory Function: WFL (See BSE) Anatomy: Anatomy: Suspected cervical osteophytes Boluses Administered: Boluses Administered Boluses Administered: Thin liquids (Level 0); Mildly thick liquids (Level 2, nectar thick); Moderately thick liquids (Level 3, honey thick); Solid; Puree  Oral Impairment Domain: Oral Impairment Domain Lip Closure: No labial escape Tongue control during bolus hold: Cohesive bolus between tongue to palatal seal Bolus preparation/mastication: Timely and efficient chewing and mashing Bolus transport/lingual motion: Brisk tongue motion Oral residue: Complete oral clearance Location of oral residue : N/A  Pharyngeal Impairment Domain: Pharyngeal Impairment Domain Soft palate elevation: No bolus between soft palate (SP)/pharyngeal wall (PW) Laryngeal elevation: Complete superior movement of thyroid cartilage with complete approximation of arytenoids to epiglottic petiole Epiglottic movement: Complete inversion Laryngeal vestibule closure: Complete, no air/contrast in laryngeal vestibule Pharyngeal stripping wave : Present -  diminished Pharyngeal contraction (A/P view only): N/A Pharyngoesophageal segment opening: Partial distention/partial duration, partial obstruction of flow Tongue base retraction: Trace column of contrast or air between tongue base and PPW Pharyngeal residue: Complete pharyngeal clearance Location of pharyngeal residue: N/A  Esophageal Impairment Domain: Esophageal Impairment Domain Esophageal clearance upright position: Esophageal retention Pill: Pill Consistency administered: Thin liquids (Level 0) Thin liquids (Level 0): [facility] Penetration/Aspiration Scale Score:  Penetration/Aspiration Scale Score 1.  Material does not enter airway: Thin liquids (Level 0); Mildly thick liquids (Level 2, nectar thick); Moderately thick liquids (Level 3, honey thick); Puree; Solid; Pill Compensatory Strategies: Compensatory Strategies Compensatory strategies: No   General Information: Caregiver present: No  Diet Prior to this Study: Regular; Thin liquids (Level 0)   Temperature : Normal   Respiratory Status: WFL   No data recorded  History of Recent Intubation: No  Behavior/Cognition: Alert; Cooperative; Pleasant mood Self-Feeding Abilities: Able to self-feed Baseline vocal quality/speech: Normal No data recorded Volitional Swallow: Able to elicit Exam Limitations: No limitations Goal Planning: Prognosis for improved oropharyngeal function: -- (N/A) No data recorded No data recorded No data recorded Consulted and agree with results and recommendations: Patient Pain: Pain Assessment Pain Assessment: 0-10 Pain Score: 5 Faces Pain Scale: 0 Pain Location: RLE Pain Descriptors / Indicators: Cramping Pain Intervention(s): Monitored during session End of Session: Start Time:SLP Start Time (ACUTE ONLY): 0956 Stop Time: SLP Stop Time (ACUTE ONLY): 1013 Time Calculation:SLP Time Calculation (min) (ACUTE ONLY): 17 min Charges: SLP Evaluations $ SLP Speech Visit: 1 Visit SLP Evaluations $BSS Swallow: 1 Procedure SLP visit diagnosis: SLP Visit Diagnosis: Dysphagia, unspecified (R13.10) Past Medical History: Past Medical History: Diagnosis Date  Cataracts, bilateral   Cerebral infarction due to carotid artery stenosis (HCC)   Diabetes mellitus type 2 in nonobese (HCC)   Glaucoma   Hypertension   Hypertensive emergency 11/22/2022  Stroke (HCC)   no residual, series of mini strokes  Tobacco abuse   Vertigo  Past Surgical History: Past Surgical History: Procedure Laterality Date  ABDOMINAL HYSTERECTOMY    CARDIAC DEFIBRILLATOR PLACEMENT    CORONARY STENT INTERVENTION N/A 11/26/2022  Procedure: CORONARY STENT  INTERVENTION;  Surgeon: Dann Candyce RAMAN, MD;  Location: [facility];  Service: Cardiovascular;  Laterality: N/A;  gum operation    Implantable loop recorder removal  02/01/2019  MDT Reveal LINQ explantation in office by JA  IR 3D INDEPENDENT WKST  05/07/2017  IR 3D INDEPENDENT WKST  09/17/2023  IR ANGIO INTRA EXTRACRAN SEL COM CAROTID INNOMINATE BILAT MOD SED  04/23/2017  IR ANGIO INTRA EXTRACRAN SEL COM CAROTID INNOMINATE BILAT MOD SED  08/06/2017  IR ANGIO INTRA EXTRACRAN SEL COM CAROTID INNOMINATE UNI R MOD SED  05/07/2017  IR ANGIO INTRA EXTRACRAN SEL INTERNAL CAROTID BILAT MOD SED  09/17/2023  IR ANGIO VERTEBRAL SEL VERTEBRAL BILAT MOD SED  04/23/2017  IR ANGIO VERTEBRAL SEL VERTEBRAL BILAT MOD SED  08/06/2017  IR ANGIOGRAM FOLLOW UP STUDY  05/07/2017  IR ANGIOGRAM SELECTIVE EACH ADDITIONAL VESSEL  05/07/2017  IR RADIOLOGIST EVAL & MGMT  04/16/2017  IR RADIOLOGIST EVAL & MGMT  05/29/2017  IR RADIOLOGIST EVAL & MGMT  09/29/2023  IR RADIOLOGIST EVAL & MGMT  11/05/2023  IR TRANSCATH/EMBOLIZ  05/07/2017  LOOP RECORDER INSERTION N/A 03/28/2017  Procedure: Loop Recorder Insertion;  Surgeon: Lynwood Rakers, MD;  Location: [facility];  Service: Cardiovascular;  Laterality: N/A;  MASTECTOMY W/ SENTINEL NODE BIOPSY Right 06/25/2022  Procedure: RIGHT MASTECTOMY WITH AXILLARY SENTINEL LYMPH NODE BIOPSY;  Surgeon: Belinda Cough, MD;  Location: [facility];  Service: General;  Laterality: Right;  RADIOLOGY WITH ANESTHESIA N/A 05/07/2017  Procedure: EMBOLIZATION;  Surgeon: Dolphus Carrion, MD;  Location: [facility];  Service: Radiology;  Laterality: N/A;  RADIOLOGY WITH ANESTHESIA N/A 08/06/2017  Procedure: RADIOLOGY WITH ANESTHESIA STENTING;  Surgeon: Dolphus Carrion, MD;  Location: [facility];  Service: Radiology;  Laterality: N/A;  RIGHT/LEFT HEART CATH AND CORONARY ANGIOGRAPHY N/A 11/26/2022  Procedure: RIGHT/LEFT HEART CATH AND CORONARY ANGIOGRAPHY;  Surgeon: Dann Candyce RAMAN, MD;  Location: [facility] INVASIVE CV LAB;   Service: Cardiovascular;  Laterality: N/A;  TEE WITHOUT CARDIOVERSION N/A 03/28/2017  Procedure: TRANSESOPHAGEAL ECHOCARDIOGRAM (TEE);  Surgeon: Redell RAMAN Shallow, MD;  Location[facility] END[facility];  Service: Cardiovascular;  Laterality: N/A; Anette FORBES Grippe, MA, CCC-SLP Acute Rehabilitation Services Office: [phone] 12/01/2023, 1:36 PM  VAS US  LOWER EXTREMITY VENOUS (DVT) (ONLY [facility]) Result Date: 12/01/2023  Lower Venous DVT Study Patient Name:  Amy Howell  Date of Exam:   11/30/2023 Medical Rec #: [id number]      Accession #:    [id number] Date of Birth: 1956/06/11       Patient Gender: F Patient Age:   47 years Exam Location:  [facility] Procedure:      VAS US  LOWER EXTREMITY VENOUS (DVT) Referring Phys: RILEY RANSOM --------------------------------------------------------------------------------  Indications: Increased leg swelling and SOB since 11/28/23. Patient with heart failure but medicinal non compliance.  Comparison Study: No prior study on file Performing Technologist: Alberta Lis RVS  Examination Guidelines: A complete evaluation includes B-mode imaging, spectral Doppler, color Doppler, and power Doppler as needed of all accessible portions of each vessel. Bilateral testing is considered an integral part of a complete examination. Limited examinations for reoccurring indications may be performed as noted. The reflux portion of the exam is performed with the patient in reverse Trendelenburg.  +-----+---------------+---------+-----------+------------------+--------------+ RIGHTCompressibilityPhasicitySpontaneityProperties        Thrombus Aging +-----+---------------+---------+-----------+------------------+--------------+ CFV  Full           Yes      No         pulsatile waveform               +-----+---------------+---------+-----------+------------------+--------------+   +--------+---------------+---------+-----------+----------------+-------------+ LEFT     CompressibilityPhasicitySpontaneityProperties      Thrombus                                                                 Aging         +--------+---------------+---------+-----------+----------------+-------------+ CFV     Full           Yes      No         pulsatile                                                                waveform                      +--------+---------------+---------+-----------+----------------+-------------+ SFJ     Full                                                             +--------+---------------+---------+-----------+----------------+-------------+  FV Prox Full                                                             +--------+---------------+---------+-----------+----------------+-------------+ FV Mid  Full                                                             +--------+---------------+---------+-----------+----------------+-------------+ FV      Full                                                             Distal                                                                   +--------+---------------+---------+-----------+----------------+-------------+ PFV     Full                                                             +--------+---------------+---------+-----------+----------------+-------------+ POP     Full           Yes      No         pulsatile                                                                waveform                      +--------+---------------+---------+-----------+----------------+-------------+ PTV     Full                                                             +--------+---------------+---------+-----------+----------------+-------------+ PERO    Full                                                             +--------+---------------+---------+-----------+----------------+-------------+     Summary: RIGHT: - No evidence of  common femoral vein obstruction.  pulsatile waveforms  consistent with fluid overload  LEFT: - There is no evidence of deep vein thrombosis in the lower extremity.  - No cystic structure found in the popliteal fossa. Pulsatile waveforms consistent with fluid overload.  *See table(s) above for measurements and observations. Electronically signed by Norman Serve on 12/01/2023 at 10:56:55 AM.    Final    CT ABDOMEN PELVIS WO CONTRAST Result Date: 11/30/2023 CLINICAL DATA:  Increasing short of breath and lower extremity edema, mucous in stools, loss of appetite, constipation EXAM: CT ABDOMEN AND PELVIS WITHOUT CONTRAST TECHNIQUE: Multidetector CT imaging of the abdomen and pelvis was performed following the standard protocol without IV contrast. Unenhanced CT was performed per clinician order. Lack of IV contrast limits sensitivity and specificity, especially for evaluation of abdominal/pelvic solid viscera. RADIATION DOSE REDUCTION: This exam was performed according to the departmental dose-optimization program which includes automated exposure control, adjustment of the mA and/or kV according to patient size and/or use of iterative reconstruction technique. COMPARISON:  11/30/2023, 09/12/2023 FINDINGS: Lower chest: The heart is enlarged with small pericardial effusion. There are bilateral pleural effusions, right greater than left. Emphysema, with bibasilar atelectasis. Hepatobiliary: Unremarkable unenhanced appearance of the liver and gallbladder. Pancreas: Unremarkable unenhanced appearance. Spleen: Unremarkable unenhanced appearance. Adrenals/Urinary Tract: No urinary tract calculi or obstructive uropathy. Stable proteinaceous left renal cyst and simple right renal cyst. No specific imaging follow-up is required. The adrenals and bladder are unremarkable. Stomach/Bowel: No bowel obstruction or ileus. Normal retrocecal appendix. Diffuse colonic diverticulosis without evidence of acute diverticulitis. No bowel  wall thickening or inflammatory change. Minimal retained stool within the proximal colon. Vascular/Lymphatic: Aortic atherosclerosis. No enlarged abdominal or pelvic lymph nodes. Reproductive: Status post hysterectomy. No adnexal masses. Other: No free fluid or free intraperitoneal gas. No abdominal wall hernia. Musculoskeletal: No acute or destructive bony abnormalities. Reconstructed images demonstrate no additional findings. IMPRESSION: 1. Cardiomegaly with small pericardial effusion. 2. Small bilateral pleural effusions, right greater than left, with bibasilar atelectasis. 3. Diffuse colonic diverticulosis without diverticulitis. 4. No bowel obstruction or ileus. Minimal retained stool within the proximal colon. 5. Aortic Atherosclerosis (ICD10-I70.0) and Emphysema (ICD10-J43.9). Electronically Signed   By: Ozell Daring M.D.   On: 11/30/2023 20:59   DG Abdomen 1 View Result Date: 11/30/2023 CLINICAL DATA:  Constipation. EXAM: ABDOMEN - 1 VIEW COMPARISON:  None Available. FINDINGS: The bowel gas pattern is normal. No radio-opaque calculi or other significant radiographic abnormality are seen. IMPRESSION: Negative. Electronically Signed   By: Fonda Field M.D.   On: 11/30/2023 13:23   DG Chest 2 View Result Date: 11/30/2023 CLINICAL DATA:  sob EXAM: CHEST - 2 VIEW COMPARISON:  09/12/2023. FINDINGS: Cardiac silhouette is prominent. There is diffuse pulmonary interstitial prominence which could be seen with atypical infection, edema or interstitial lung disease. There is no focal consolidation. No pneumothorax. Small right-sided pleural effusion. Aorta is calcified. The visualized osseous structures are unremarkable. IMPRESSION: Nonspecific interstitial prominence consistent with atypical infection, edema or interstitial lung disease. No focal consolidation. Electronically Signed   By: Fonda Field M.D.   On: 11/30/2023 13:22     Signed: [name], MD Internal Medicine  Resident, PGY-1 [facility]  Pager: [phone] 11:25 AM, 12/03/2023

## 2023-12-04 ENCOUNTER — Telehealth: Payer: Self-pay

## 2023-12-04 ENCOUNTER — Other Ambulatory Visit (HOSPITAL_COMMUNITY): Payer: Self-pay

## 2023-12-04 ENCOUNTER — Other Ambulatory Visit: Payer: Self-pay

## 2023-12-04 DIAGNOSIS — D649 Anemia, unspecified: Secondary | ICD-10-CM | POA: Insufficient documentation

## 2023-12-04 DIAGNOSIS — D508 Other iron deficiency anemias: Secondary | ICD-10-CM | POA: Insufficient documentation

## 2023-12-04 NOTE — Telephone Encounter (Signed)
-----   Message from Benancio Deeds sent at 12/02/2023  4:42 PM EST ----- Regarding: outpatient follow up VC Hi Dottie, this patient needs outpatient post hospitalization follow up for dysphagia with VC or APP in the next few months. Thanks

## 2023-12-04 NOTE — Telephone Encounter (Signed)
 Patient referred to infusion pharmacy team for ambulatory infusion of IV iron.  Insurance - Medical Sales Representative  Dx code - D50.8 IV Iron Therapy - Venofer 200 mg IV x 5  Infusion appointments - Scheduling team will schedule patient as soon as possible.    Ted Goodner D. Porfiria Heinrich, PharmD

## 2023-12-04 NOTE — Telephone Encounter (Signed)
 Pt is already scheduled to see Dr. Barron Alvine 01/06/24 at 10:40am.

## 2023-12-04 NOTE — Telephone Encounter (Signed)
 Auth Submission: NO AUTH NEEDED Site of care: Site of care: [facility] Payer: [employer] Medication & CPT/J Code(s) submitted: Venofer (Iron Sucrose) J1756 Route of submission (phone, fax, portal):  Phone # Fax # Auth type: Buy/Bill PB Units/visits requested: 200mg  x 5 doses Reference number:  Approval from: 12/04/23 to 12/01/24

## 2023-12-04 NOTE — Transitions of Care (Post Inpatient/ED Visit) (Signed)
   12/04/2023  Name: Amy Howell MRN: [id number] DOB: [birth year]  Today's TOC FU Call Status: Today's TOC FU Call Status:: Unsuccessful Call (1st Attempt) Unsuccessful Call (1st Attempt) Date: 12/04/23  Attempted to reach the patient regarding the most recent Inpatient/ED visit.  Follow Up Plan: Additional outreach attempts will be made to reach the patient to complete the Transitions of Care (Post Inpatient/ED visit) call.   Channing Larry, RN, BA, [facility], CRRN [facility] Coordinator, Transition of Care Ph # [phone]

## 2023-12-05 ENCOUNTER — Telehealth: Payer: Self-pay

## 2023-12-05 LAB — CALPROTECTIN, FECAL: Calprotectin, Fecal: 55 ug/g (ref 0–120)

## 2023-12-05 NOTE — Transitions of Care (Post Inpatient/ED Visit) (Signed)
   12/05/2023  Name: Amy Howell MRN: [id number] DOB: September 18, 1956  Today's TOC FU Call Status: Today's TOC FU Call Status:: Unsuccessful Call (2nd Attempt) Unsuccessful Call (2nd Attempt) Date: 12/05/23  Attempted to reach the patient regarding the most recent Inpatient/ED visit.  Follow Up Plan: Additional outreach attempts will be made to reach the patient to complete the Transitions of Care (Post Inpatient/ED visit) call.   Channing Larry, RN, BA, [facility], CRRN [facility] Coordinator, Transition of Care Ph # [phone]

## 2023-12-08 ENCOUNTER — Telehealth: Payer: Self-pay

## 2023-12-08 NOTE — Transitions of Care (Post Inpatient/ED Visit) (Signed)
   12/08/2023  Name: Amy Howell MRN: [id number] DOB: 07-03-1956  Today's TOC FU Call Status: Today's TOC FU Call Status:: Unsuccessful Call (3rd Attempt) Unsuccessful Call (3rd Attempt) Date: 12/08/23  Attempted to reach the patient regarding the most recent Inpatient/ED visit.  Follow Up Plan: No further outreach attempts will be made at this time. We have been unable to contact the patient.  Channing Larry, RN, BA, [facility], CRRN [facility] Coordinator, Transition of Care Ph # [phone]

## 2023-12-08 NOTE — Progress Notes (Deleted)
 Cardiology Office Note:  .   Date:  12/08/2023  ID:  Amy Howell, DOB October 08, 1956, MRN [id number] PCP: Elicia Sharper, DO  [facility] Providers Cardiologist:  Alvan Ronal BRAVO, MD { }   History of Present Illness: .   Amy Howell is a 68 y.o. female with a PMH significant for HFrEF, hyperlipidemia, invasive lobular carcinoma of the breast s/p resection, refusing chemotherapy, cachexia, anemia, hypertension, CVA, saccular aneurysms,CKD 3A,  with admission for HFrEF exacerbation, with dyspnea and LEE. She was continued on coreg  6.25 mg BID, ASA 81 mg daily, Brilinta  90 mg BID.[name] She was also suffering with dysphasia, which was improved with diureses, she was to follow up with GI.   ROS: ***  Studies Reviewed: .      Echocardiogram 10/08/2023 1. Limited evaluation of valves.   2. Left ventricular ejection fraction, by estimation, is 30 to 35%. The  left ventricle has moderately decreased function. The left ventricle  demonstrates global hypokinesis. The left ventricular internal cavity size  was moderately to severely dilated.   3. Right ventricular systolic function is normal. The right ventricular  size is normal. There is mildly elevated pulmonary artery systolic  pressure.   4. Left atrial size was severely dilated.   5. Moderate near circumferential effusoin Appears slightly larger than  prior echo 09/14/23 particularly in the lateral position No evidence of  tamponade IVC more dilated 1.8-> 2.1 cm No RV diastolic collapse mild RA  inversion in diastole . Moderate  pericardial effusion. The pericardial effusion is circumferential.   6. The mitral valve is abnormal. Moderate mitral valve regurgitation.   7. Tricuspid valve regurgitation is mild to moderate.   8. Aortic valve regurgitation is moderate.   RHC/LHC 11/26/2022 1st Mrg lesion is 50% stenosed.   Mid RCA lesion is 75% stenosed.  A drug-eluting stent was successfully placed using a SYNERGY XD [id number], postdilated to  greater than 3.5 mm.   Post intervention, there is a 0% residual stenosis.   There is moderate left ventricular systolic dysfunction.   LV end diastolic pressure is moderately elevated.   The left ventricular ejection fraction is 35-45% by visual estimate.   There is no aortic valve stenosis.   Right ulnar artery looks slightly larger.  There was some tortuosity and a branch vessel which the wire selected initially.  With a JR 4 catheter versa core wire or was was directed more meat daily and went into the radial artery.   Aortic saturation 96%, PA saturation 70%, PA pressure 29/12, mean PA pressure 20 mmHg, mean pulmonary capillary wedge pressure 5 mmHg, mean right atrial pressure 1 mmHg, cardiac output 5.02 L/min, cardiac index 3.58.   Normal right heart pressures.   Successful PCI of the mid RCA.  Will change antiplatelet therapy to aspirin  81 mg daily and clopidogrel  75 mg daily.  Would have her stop the Brilinta  45 mg twice daily dose.  She will need aggressive secondary prevention.  She will need aggressive medical therapy for her LV dysfunction.  *** EKG Interpretation Date/Time:    Ventricular Rate:    PR Interval:    QRS Duration:    QT Interval:    QTC Calculation:   R Axis:      Text Interpretation:      Physical Exam:   VS:  There were no vitals taken for this visit.   Wt Readings from Last 3 Encounters:  12/03/23 90 lb 4.8 oz (41 kg)  11/20/23 91 lb (41.3  kg)  10/01/23 89 lb 11.2 oz (40.7 kg)    GEN: Well nourished, well developed in no acute distress NECK: No JVD; No carotid bruits CARDIAC: ***RRR, no murmurs, rubs, gallops RESPIRATORY:  Clear to auscultation without rales, wheezing or rhonchi  ABDOMEN: Soft, non-tender, non-distended EXTREMITIES:  No edema; No deformity   ASSESSMENT AND PLAN: .   ***    {Are you ordering a CV Procedure (e.g. stress test, cath, DCCV, TEE, etc)?   Press F2        :[id number]}    Signed, Lamarr HERO. Jerilynn CHOL, ANP, AACC

## 2023-12-09 ENCOUNTER — Ambulatory Visit: Payer: Medicare (Managed Care) | Admitting: Adult Health

## 2023-12-10 ENCOUNTER — Ambulatory Visit: Payer: Medicare (Managed Care) | Admitting: Student

## 2023-12-11 ENCOUNTER — Other Ambulatory Visit (HOSPITAL_COMMUNITY): Payer: Self-pay

## 2023-12-11 ENCOUNTER — Encounter: Payer: Self-pay | Admitting: Student

## 2023-12-11 ENCOUNTER — Other Ambulatory Visit: Payer: Self-pay

## 2023-12-11 ENCOUNTER — Ambulatory Visit (INDEPENDENT_AMBULATORY_CARE_PROVIDER_SITE_OTHER): Payer: Medicare (Managed Care) | Admitting: Student

## 2023-12-11 VITALS — BP 169/81 | HR 70 | Temp 97.7°F | Ht 60.0 in | Wt 92.9 lb

## 2023-12-11 DIAGNOSIS — C50311 Malignant neoplasm of lower-inner quadrant of right female breast: Secondary | ICD-10-CM

## 2023-12-11 DIAGNOSIS — D649 Anemia, unspecified: Secondary | ICD-10-CM | POA: Diagnosis not present

## 2023-12-11 DIAGNOSIS — I159 Secondary hypertension, unspecified: Secondary | ICD-10-CM

## 2023-12-11 DIAGNOSIS — Z7902 Long term (current) use of antithrombotics/antiplatelets: Secondary | ICD-10-CM

## 2023-12-11 DIAGNOSIS — I5022 Chronic systolic (congestive) heart failure: Secondary | ICD-10-CM

## 2023-12-11 DIAGNOSIS — R636 Underweight: Secondary | ICD-10-CM

## 2023-12-11 DIAGNOSIS — Z681 Body mass index (BMI) 19 or less, adult: Secondary | ICD-10-CM

## 2023-12-11 DIAGNOSIS — I10 Essential (primary) hypertension: Secondary | ICD-10-CM

## 2023-12-11 DIAGNOSIS — D508 Other iron deficiency anemias: Secondary | ICD-10-CM

## 2023-12-11 MED ORDER — SPIRONOLACTONE 25 MG PO TABS
12.5000 mg | ORAL_TABLET | Freq: Every day | ORAL | 11 refills | Status: DC
  Filled 2023-12-11: qty 30, 60d supply, fill #0

## 2023-12-11 MED ORDER — TICAGRELOR 90 MG PO TABS
90.0000 mg | ORAL_TABLET | Freq: Two times a day (BID) | ORAL | 11 refills | Status: DC
  Filled 2023-12-11: qty 60, 30d supply, fill #0
  Filled 2024-01-18: qty 60, 30d supply, fill #1
  Filled 2024-02-18: qty 60, 30d supply, fill #2
  Filled 2024-03-18 (×2): qty 60, 30d supply, fill #3
  Filled 2024-04-22: qty 60, 30d supply, fill #4
  Filled 2024-05-18: qty 60, 30d supply, fill #5
  Filled 2024-06-20: qty 60, 30d supply, fill #6
  Filled 2024-07-26: qty 60, 30d supply, fill #7
  Filled 2024-08-31: qty 60, 30d supply, fill #8
  Filled 2024-09-27: qty 60, 30d supply, fill #9
  Filled 2024-10-27: qty 60, 30d supply, fill #10
  Filled 2024-11-24: qty 60, 30d supply, fill #11

## 2023-12-11 NOTE — Assessment & Plan Note (Addendum)
 Stable today on exam after recent hospitalization 12/29-1/1 for HFrEF. Today, saturating at 97% on RA. GDMT limited due to intolerances in past. On Coreg  12.5 mg BID now. Agreeable to add on spironolactone  today. Last BMP stable electrolytes and renal function. Needs to f/u with cardiology outpatient.   Plan -BMP today -F/u with cardiology (contact info given) -Continue Coreg  12.5 mg BID -Start spironolactone  12.5 mg daily

## 2023-12-11 NOTE — Assessment & Plan Note (Signed)
 BMI noted increased to 18.14 from 17.77. Weight is up to 92 lb (42.1 kg). Following outpatient GI in February for dysphagia f/u evaluated at recent hospitalization. Was on mirtazapine but taking it inconsistently.

## 2023-12-11 NOTE — Patient Instructions (Addendum)
 Thank you, Ms.Juni Brooker for allowing us  to provide your care today. Today we discussed   -Blood pressure still high today. I am worried given history of stroke and aneurysm.  -Start spironolactone  12.5 mg (1/2 tablet) once a day for blood pressure and heart failure  -Blood work today to check blood counts and electrolytes  FOLLOW UP:  -Cancer doctor follow up: Amber Stalls, MD [phone]) -Heart doctor follow up: Dr. Ronal Ross [phone]) -GI doctor follow up: appt scheduled for 01/06/24  IV Iron infusions:  We received a referral for you for IV iron infusions at our clinic. Your provider ordered a medication called Venofer, to be given in a series of five doses. We can schedule these as frequently as every other day.  Please give us  a call at [phone], and we would be happy to provide more information and get you scheduled.  [facility] [address] [place] [place], [place] [address]     I have ordered the following medication/changed the following medications:   Start the following medications: Meds ordered this encounter  Medications   spironolactone  (ALDACTONE ) 25 MG tablet    Sig: Take 0.5 tablets (12.5 mg total) by mouth daily.    Dispense:  30 tablet    Refill:  11   ticagrelor  (BRILINTA ) 90 MG TABS tablet    Sig: Take 1 tablet (90 mg total) by mouth 2 (two) times daily.    Dispense:  60 tablet    Refill:  11     Follow up:  1 month    Should you have any questions or concerns please call the internal medicine clinic at [phone].    Cleave Ternes, D.O. [facility]

## 2023-12-11 NOTE — Assessment & Plan Note (Addendum)
 Lost to follow up. Discussed with patient and her stepdaughter about rescheduling missed appt. Provided oncology contact information to reschedule f/u visit.

## 2023-12-11 NOTE — Assessment & Plan Note (Signed)
 Refilled home Brilinta 90 mg BID.

## 2023-12-11 NOTE — Progress Notes (Signed)
 CC: HFU  HPI:  Amy Howell is a 68 y.o. female living with a history stated below and presents today for HFU. Please see problem based assessment and plan for additional details.  Presents today for hospital f/u after patient admitted on 12/29-1/1 for SOB due to acute exacerbation HFrEF.   Stepdaughter present during encounter.   Past Medical History:  Diagnosis Date   Cataracts, bilateral    Cerebral infarction due to carotid artery stenosis (HCC)    Diabetes mellitus type 2 in nonobese (HCC)    Glaucoma    Hypertension    Hypertensive emergency 11/22/2022   Stroke [facility])    no residual, series of mini strokes   Tobacco abuse    Vertigo     Current Outpatient Medications on File Prior to Visit  Medication Sig Dispense Refill   aspirin  81 MG chewable tablet Chew 1 tablet (81 mg total) by mouth daily. 30 tablet 3   carvedilol  (COREG ) 12.5 MG tablet Take 1 tablet (12.5 mg total) by mouth 2 (two) times daily with a meal. 30 tablet 0   dorzolamide  (TRUSOPT ) 2 % ophthalmic solution Instill 1 drop into both eyes twice a day (Patient taking differently: Place 1 drop into both eyes daily.) 30 mL 3   feeding supplement (ENSURE ENLIVE / ENSURE PLUS) LIQD Take 237 mLs by mouth 3 (three) times daily between meals. 237 mL 12   latanoprost  (XALATAN ) 0.005 % ophthalmic solution Instill 1 drop into both eyes at bedtime 22.5 mL 3   Multiple Vitamin (MULTIVITAMIN WITH MINERALS) TABS tablet Take 1 tablet by mouth daily. 30 tablet 0   polyethylene glycol (MIRALAX  / GLYCOLAX ) 17 g packet Take 17 g by mouth daily. 14 each 0   thiamine  (VITAMIN B-1) 100 MG tablet Take 1 tablet (100 mg total) by mouth daily. 30 tablet 0   timolol  (TIMOPTIC ) 0.5 % ophthalmic solution Place 1 drop into both eyes every morning. 45 mL 3   No current facility-administered medications on file prior to visit.    Family History  Problem Relation Age of Onset   Dementia Mother    Kidney disease Father     Hypertension Father    Diabetes Father    Heart disease Father    Lung cancer Maternal Aunt        dx after 81   Prostate cancer Other        MGM's brother   Breast cancer Neg Hx    Colon cancer Neg Hx    Liver disease Neg Hx    Esophageal cancer Neg Hx     Social History   Socioeconomic History   Marital status: Single    Spouse name: Not on file   Number of children: 2   Years of education: Not on file   Highest education level: 10th grade  Occupational History   Occupation: retired  Tobacco Use   Smoking status: Former    Current packs/day: 0.50    Average packs/day: 0.5 packs/day for 56.0 years (28.0 ttl pk-yrs)    Types: Cigarettes   Smokeless tobacco: Never  Vaping Use   Vaping status: Never Used  Substance and Sexual Activity   Alcohol use: No   Drug use: No   Sexual activity: Not Currently    Birth control/protection: Surgical    Comment: Hyst  Other Topics Concern   Not on file  Social History Narrative   Not on file   Social Drivers of Corporate Investment Banker  Strain: Low Risk  (09/17/2023)   Overall Financial Resource Strain (CARDIA)    Difficulty of Paying Living Expenses: Not hard at all  Food Insecurity: No Food Insecurity (12/11/2023)   Hunger Vital Sign    Worried About Running Out of Food in the Last Year: Never true    Ran Out of Food in the Last Year: Never true  Transportation Needs: No Transportation Needs (12/11/2023)   PRAPARE - Administrator, Civil Service (Medical): No    Lack of Transportation (Non-Medical): No  Physical Activity: Insufficiently Active (04/08/2023)   Exercise Vital Sign    Days of Exercise per Week: 3 days    Minutes of Exercise per Session: 30 min  Stress: No Stress Concern Present (04/08/2023)   [employer] of Occupational Health - Occupational Stress Questionnaire    Feeling of Stress : Not at all  Social Connections: Socially Isolated (12/11/2023)   Social Connection and Isolation Panel [NHANES]     Frequency of Communication with Friends and Family: More than three times a week    Frequency of Social Gatherings with Friends and Family: More than three times a week    Attends Religious Services: Never    Database Administrator or Organizations: No    Attends Banker Meetings: Never    Marital Status: Never married  Intimate Partner Violence: Not At Risk (12/11/2023)   Humiliation, Afraid, Rape, and Kick questionnaire    Fear of Current or Ex-Partner: No    Emotionally Abused: No    Physically Abused: No    Sexually Abused: No    Review of Systems: ROS negative except for what is noted on the assessment and plan.  Vitals:   12/11/23 0937 12/11/23 1031  BP: (!) 176/79 (!) 169/81  Pulse: 75 70  Temp: 97.7 F (36.5 C)   TempSrc: Oral   SpO2: 97%   Weight: 92 lb 14.4 oz (42.1 kg)   Height: 5' (1.524 m)    Physical Exam: Constitutional: thin appearing female sitting in chair, in no acute distress Cardiovascular: regular rate and rhythm Pulmonary/Chest: normal work of breathing on RA, lungs CTAB MSK: trace left ankle edema Neurological: alert & oriented x 3  Assessment & Plan:   Hypertension BP remains elevated. Taking carvedilol  12.5 mg BID. Reports adherence and takes it BID. Extensively discussed concern for uncontrolled HTN with hx of CVA and brain aneurysm. Patient agreeable to restart spironolactone  at 12.5 mg daily. No to amlodipine  and thiazide due to previous side effects. Reported angioedema with ACEI.   Plan -Continue Coreg  12.5 mg BID -Start spironolactone  12.5 mg daily  -F/u in 1 month  -Reassess BP at next OV, continue discussions on titrate up BP meds  Chronic heart failure with reduced ejection fraction (HFrEF, <= 40%) (HCC) Stable today on exam after recent hospitalization 12/29-1/1 for HFrEF. Today, saturating at 97% on RA. GDMT limited due to intolerances in past. On Coreg  12.5 mg BID now. Agreeable to add on spironolactone  today. Last  BMP stable electrolytes and renal function. Needs to f/u with cardiology outpatient.   Plan -BMP today -F/u with cardiology (contact info given) -Continue Coreg  12.5 mg BID -Start spironolactone  12.5 mg daily   Underweight BMI noted increased to 18.14 from 17.77. Weight is up to 92 lb (42.1 kg). Following outpatient GI in February for dysphagia f/u evaluated at recent hospitalization. Was on mirtazapine  but taking it inconsistently.   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 Discussed with patient and her stepdaughter about rescheduling missed appt. Provided oncology contact information to reschedule f/u visit.   Chronic anemia Was set up for IV iron infusions at hospital discharge for hx of IDA. No overt signs of bleeding. Hgb last 10.7, baseline around 10-12.   Plan -CBC today -IV iron infusions already setup   Antiplatelet or antithrombotic long-term use Refilled home Brilinta  90 mg BID.    Patient discussed with Dr. Guilloud  Erik Nessel, D.O. [facility], PGY-2 Phone: [phone] Date 12/11/2023 Time 7:50 PM

## 2023-12-11 NOTE — Assessment & Plan Note (Signed)
 Was set up for IV iron infusions at hospital discharge for hx of IDA. No overt signs of bleeding. Hgb last 10.7, baseline around 10-12.   Plan -CBC today -IV iron infusions already setup

## 2023-12-11 NOTE — Assessment & Plan Note (Addendum)
 BP remains elevated. Taking carvedilol  12.5 mg BID. Reports adherence and takes it BID. Extensively discussed concern for uncontrolled HTN with hx of CVA and brain aneurysm. Patient agreeable to restart spironolactone  at 12.5 mg daily. No to amlodipine  and thiazide due to previous side effects. Reported angioedema with ACEI.   Plan -Continue Coreg  12.5 mg BID -Start spironolactone  12.5 mg daily  -F/u in 1 month  -Reassess BP at next OV, continue discussions on titrate up BP meds

## 2023-12-12 LAB — CBC
Hematocrit: 37 % (ref 34.0–46.6)
Hemoglobin: 11.3 g/dL (ref 11.1–15.9)
MCH: 29.4 pg (ref 26.6–33.0)
MCHC: 30.5 g/dL — ABNORMAL LOW (ref 31.5–35.7)
MCV: 96 fL (ref 79–97)
Platelets: 202 10*3/uL (ref 150–450)
RBC: 3.85 x10E6/uL (ref 3.77–5.28)
RDW: 14.4 % (ref 11.7–15.4)
WBC: 4.4 10*3/uL (ref 3.4–10.8)

## 2023-12-12 LAB — BMP8+ANION GAP
Anion Gap: 12 mmol/L (ref 10.0–18.0)
BUN/Creatinine Ratio: 15 (ref 12–28)
BUN: 21 mg/dL (ref 8–27)
CO2: 20 mmol/L (ref 20–29)
Calcium: 8.9 mg/dL (ref 8.7–10.3)
Chloride: 106 mmol/L (ref 96–106)
Creatinine, Ser: 1.36 mg/dL — ABNORMAL HIGH (ref 0.57–1.00)
Glucose: 93 mg/dL (ref 70–99)
Potassium: 5.1 mmol/L (ref 3.5–5.2)
Sodium: 138 mmol/L (ref 134–144)
eGFR: 42 mL/min/{1.73_m2} — ABNORMAL LOW (ref >59)

## 2023-12-12 NOTE — Progress Notes (Signed)
 Internal Medicine Clinic Attending  Case discussed with the resident at the time of the visit.  We reviewed the resident's history and exam and pertinent patient test results.  I agree with the assessment, diagnosis, and plan of care documented in the resident's note.

## 2023-12-16 ENCOUNTER — Telehealth: Payer: Self-pay

## 2023-12-19 ENCOUNTER — Ambulatory Visit: Payer: Medicare (Managed Care) | Admitting: Gastroenterology

## 2023-12-23 ENCOUNTER — Other Ambulatory Visit (HOSPITAL_COMMUNITY): Payer: Self-pay

## 2023-12-23 ENCOUNTER — Emergency Department (HOSPITAL_BASED_OUTPATIENT_CLINIC_OR_DEPARTMENT_OTHER): Payer: Medicare (Managed Care) | Admitting: Radiology

## 2023-12-23 ENCOUNTER — Other Ambulatory Visit: Payer: Self-pay | Admitting: Student

## 2023-12-23 ENCOUNTER — Inpatient Hospital Stay (HOSPITAL_BASED_OUTPATIENT_CLINIC_OR_DEPARTMENT_OTHER)
Admission: EM | Admit: 2023-12-23 | Discharge: 2023-12-26 | DRG: 291 | Disposition: A | Discharge status: Home / Self Care | Payer: Medicare (Managed Care) | Attending: Internal Medicine | Admitting: Internal Medicine

## 2023-12-23 DIAGNOSIS — F17210 Nicotine dependence, cigarettes, uncomplicated: Secondary | ICD-10-CM | POA: Diagnosis present

## 2023-12-23 DIAGNOSIS — R64 Cachexia: Secondary | ICD-10-CM | POA: Diagnosis present

## 2023-12-23 DIAGNOSIS — E871 Hypo-osmolality and hyponatremia: Secondary | ICD-10-CM

## 2023-12-23 DIAGNOSIS — I5021 Acute systolic (congestive) heart failure: Secondary | ICD-10-CM

## 2023-12-23 DIAGNOSIS — I083 Combined rheumatic disorders of mitral, aortic and tricuspid valves: Secondary | ICD-10-CM | POA: Diagnosis present

## 2023-12-23 DIAGNOSIS — Z801 Family history of malignant neoplasm of trachea, bronchus and lung: Secondary | ICD-10-CM

## 2023-12-23 DIAGNOSIS — Z91014 Allergy to mammalian meats: Secondary | ICD-10-CM

## 2023-12-23 DIAGNOSIS — I2510 Atherosclerotic heart disease of native coronary artery without angina pectoris: Secondary | ICD-10-CM | POA: Diagnosis present

## 2023-12-23 DIAGNOSIS — I5023 Acute on chronic systolic (congestive) heart failure: Secondary | ICD-10-CM | POA: Diagnosis present

## 2023-12-23 DIAGNOSIS — N1831 Chronic kidney disease, stage 3a: Secondary | ICD-10-CM | POA: Diagnosis present

## 2023-12-23 DIAGNOSIS — I509 Heart failure, unspecified: Secondary | ICD-10-CM | POA: Diagnosis not present

## 2023-12-23 DIAGNOSIS — Z681 Body mass index (BMI) 19 or less, adult: Secondary | ICD-10-CM

## 2023-12-23 DIAGNOSIS — Z7902 Long term (current) use of antithrombotics/antiplatelets: Secondary | ICD-10-CM

## 2023-12-23 DIAGNOSIS — I5020 Unspecified systolic (congestive) heart failure: Secondary | ICD-10-CM

## 2023-12-23 DIAGNOSIS — Z841 Family history of disorders of kidney and ureter: Secondary | ICD-10-CM

## 2023-12-23 DIAGNOSIS — Z91199 Patient's noncompliance with other medical treatment and regimen due to unspecified reason: Secondary | ICD-10-CM

## 2023-12-23 DIAGNOSIS — Z833 Family history of diabetes mellitus: Secondary | ICD-10-CM

## 2023-12-23 DIAGNOSIS — Z7982 Long term (current) use of aspirin: Secondary | ICD-10-CM

## 2023-12-23 DIAGNOSIS — Z9011 Acquired absence of right breast and nipple: Secondary | ICD-10-CM

## 2023-12-23 DIAGNOSIS — Z955 Presence of coronary angioplasty implant and graft: Secondary | ICD-10-CM

## 2023-12-23 DIAGNOSIS — Z820 Family history of epilepsy and other diseases of the nervous system: Secondary | ICD-10-CM

## 2023-12-23 DIAGNOSIS — I130 Hypertensive heart and chronic kidney disease with heart failure and stage 1 through stage 4 chronic kidney disease, or unspecified chronic kidney disease: Principal | ICD-10-CM | POA: Diagnosis present

## 2023-12-23 DIAGNOSIS — Z95810 Presence of automatic (implantable) cardiac defibrillator: Secondary | ICD-10-CM

## 2023-12-23 DIAGNOSIS — Z8249 Family history of ischemic heart disease and other diseases of the circulatory system: Secondary | ICD-10-CM

## 2023-12-23 DIAGNOSIS — H409 Unspecified glaucoma: Secondary | ICD-10-CM | POA: Diagnosis present

## 2023-12-23 DIAGNOSIS — I3139 Other pericardial effusion (noninflammatory): Secondary | ICD-10-CM | POA: Diagnosis present

## 2023-12-23 DIAGNOSIS — Z79899 Other long term (current) drug therapy: Secondary | ICD-10-CM

## 2023-12-23 DIAGNOSIS — E1122 Type 2 diabetes mellitus with diabetic chronic kidney disease: Secondary | ICD-10-CM | POA: Diagnosis present

## 2023-12-23 DIAGNOSIS — Z8673 Personal history of transient ischemic attack (TIA), and cerebral infarction without residual deficits: Secondary | ICD-10-CM

## 2023-12-23 DIAGNOSIS — R1310 Dysphagia, unspecified: Secondary | ICD-10-CM | POA: Diagnosis present

## 2023-12-23 DIAGNOSIS — Z888 Allergy status to other drugs, medicaments and biological substances status: Secondary | ICD-10-CM

## 2023-12-23 LAB — CBC
HCT: 39.4 % (ref 36.0–46.0)
Hemoglobin: 11.8 g/dL — ABNORMAL LOW (ref 12.0–15.0)
MCH: 28.8 pg (ref 26.0–34.0)
MCHC: 29.9 g/dL — ABNORMAL LOW (ref 30.0–36.0)
MCV: 96.1 fL (ref 80.0–100.0)
Platelets: 156 10*3/uL (ref 150–400)
RBC: 4.1 MIL/uL (ref 3.87–5.11)
RDW: 16.7 % — ABNORMAL HIGH (ref 11.5–15.5)
WBC: 4 10*3/uL (ref 4.0–10.5)
nRBC: 0 % (ref 0.0–0.2)

## 2023-12-23 LAB — BRAIN NATRIURETIC PEPTIDE: B Natriuretic Peptide: 2807.1 pg/mL — ABNORMAL HIGH (ref 0.0–100.0)

## 2023-12-23 LAB — BASIC METABOLIC PANEL
Anion gap: 8 (ref 5–15)
BUN: 23 mg/dL (ref 8–23)
CO2: 22 mmol/L (ref 22–32)
Calcium: 8.7 mg/dL — ABNORMAL LOW (ref 8.9–10.3)
Chloride: 106 mmol/L (ref 98–111)
Creatinine, Ser: 1.25 mg/dL — ABNORMAL HIGH (ref 0.44–1.00)
GFR, Estimated: 46 mL/min — ABNORMAL LOW (ref >60)
Glucose, Bld: 128 mg/dL — ABNORMAL HIGH (ref 70–99)
Potassium: 4.5 mmol/L (ref 3.5–5.1)
Sodium: 136 mmol/L (ref 135–145)

## 2023-12-23 MED ORDER — CARVEDILOL 12.5 MG PO TABS
12.5000 mg | ORAL_TABLET | Freq: Two times a day (BID) | ORAL | Status: DC
  Administered 2023-12-24: 12.5 mg via ORAL
  Filled 2023-12-23 (×2): qty 1

## 2023-12-23 MED ORDER — ASPIRIN 81 MG PO CHEW
81.0000 mg | CHEWABLE_TABLET | Freq: Every day | ORAL | Status: DC
  Administered 2023-12-24 – 2023-12-26 (×3): 81 mg via ORAL
  Filled 2023-12-23 (×4): qty 1

## 2023-12-23 MED ORDER — FUROSEMIDE 10 MG/ML IJ SOLN
40.0000 mg | Freq: Once | INTRAMUSCULAR | Status: AC
  Administered 2023-12-23: 40 mg via INTRAVENOUS
  Filled 2023-12-23: qty 4

## 2023-12-23 MED ORDER — TICAGRELOR 90 MG PO TABS
90.0000 mg | ORAL_TABLET | Freq: Two times a day (BID) | ORAL | Status: DC
  Administered 2023-12-24 – 2023-12-26 (×5): 90 mg via ORAL
  Filled 2023-12-23 (×5): qty 1

## 2023-12-23 NOTE — ED Provider Notes (Signed)
[place] EMERGENCY DEPARTMENT AT [facility] Provider Note   CSN: [id number] Arrival date & time: 12/23/23  1811     History  Chief Complaint  Patient presents with   Shortness of Breath    Amy Howell is a 68 y.o. female.   Shortness of Breath Patient with history of CHF.  States increasing shortness of breath and leg swelling.  Discharge from hospital around 3 weeks ago for CHF.  States her weight is up compared to what it was.  States she has been putting on a pound today.  However sounds as if her weight is about where it was when she was discharged.  More shortness of breath laying down.  Has had a cough with rare sputum production.  No fevers.  States more painful swelling on her legs.    Past Medical History:  Diagnosis Date   Cataracts, bilateral    Cerebral infarction due to carotid artery stenosis (HCC)    Diabetes mellitus type 2 in nonobese (HCC)    Glaucoma    Hypertension    Hypertensive emergency 11/22/2022   Stroke [facility])    no residual, "series of mini strokes"   Tobacco abuse    Vertigo     Home Medications Prior to Admission medications   Medication Sig Start Date End Date Taking? Authorizing Provider  aspirin 81 MG chewable tablet Chew 1 tablet (81 mg total) by mouth daily. 11/28/22   Danford, Earl Lites, MD  carvedilol (COREG) 12.5 MG tablet Take 1 tablet (12.5 mg total) by mouth 2 (two) times daily with a meal. 12/03/23   Alexander-Savino, Washington, MD  dorzolamide (TRUSOPT) 2 % ophthalmic solution Instill 1 drop into both eyes twice a day Patient taking differently: Place 1 drop into both eyes daily. 02/19/22     feeding supplement (ENSURE ENLIVE / ENSURE PLUS) LIQD Take 237 mLs by mouth 3 (three) times daily between meals. 09/18/23   Gwenevere Abbot, MD  latanoprost (XALATAN) 0.005 % ophthalmic solution Instill 1 drop into both eyes at bedtime 02/19/22     Multiple Vitamin (MULTIVITAMIN WITH MINERALS) TABS tablet Take 1 tablet by mouth  daily. 09/19/23   Gwenevere Abbot, MD  polyethylene glycol (MIRALAX / GLYCOLAX) 17 g packet Take 17 g by mouth daily. 12/03/23   Alexander-Savino, Washington, MD  spironolactone (ALDACTONE) 25 MG tablet Take 1/2 tablet (12.5 mg total) by mouth daily. 12/11/23 12/10/24  Rana Snare, DO  thiamine (VITAMIN B-1) 100 MG tablet Take 1 tablet (100 mg total) by mouth daily. 12/03/23   Alexander-Savino, Washington, MD  ticagrelor (BRILINTA) 90 MG TABS tablet Take 1 tablet (90 mg total) by mouth 2 (two) times daily. 12/11/23   Rana Snare, DO  timolol (TIMOPTIC) 0.5 % ophthalmic solution Place 1 drop into both eyes every morning. 04/30/23         Allergies    Bidil [isosorb dinitrate-hydralazine], Lisinopril, Pork-derived products, Rosuvastatin, and Statins    Review of Systems   Review of Systems  Respiratory:  Positive for shortness of breath.     Physical Exam Updated Vital Signs BP (!) 172/85   Pulse 90   Temp 98.1 F (36.7 C)   Resp 16   SpO2 100%  Physical Exam Vitals and nursing note reviewed.  Cardiovascular:     Rate and Rhythm: Regular rhythm.  Pulmonary:     Breath sounds: Rales present. No wheezing or rhonchi.  Chest:     Chest wall: No tenderness.  Musculoskeletal:  Right lower leg: Edema present.     Left lower leg: Edema present.     Comments: pitting edema bilateral lower extremities.  Skin:    Capillary Refill: Capillary refill takes less than 2 seconds.  Neurological:     Mental Status: She is alert and oriented to person, place, and time.     ED Results / Procedures / Treatments   Labs (all labs ordered are listed, but only abnormal results are displayed) Labs Reviewed  BASIC METABOLIC PANEL - Abnormal; Notable for the following components:      Result Value   Glucose, Bld 128 (*)    Creatinine, Ser 1.25 (*)    Calcium 8.7 (*)    GFR, Estimated 46 (*)    All other components within normal limits  CBC - Abnormal; Notable for the following components:   Hemoglobin  11.8 (*)    MCHC 29.9 (*)    RDW 16.7 (*)    All other components within normal limits  BRAIN NATRIURETIC PEPTIDE - Abnormal; Notable for the following components:   B Natriuretic Peptide 2,807.1 (*)    All other components within normal limits    EKG EKG Interpretation Date/Time:  Tuesday December 23 2023 18:27:19 EST Ventricular Rate:  91 PR Interval:  136 QRS Duration:  80 QT Interval:  392 QTC Calculation: 482 R Axis:   69  Text Interpretation: Normal sinus rhythm Left ventricular hypertrophy with repolarization abnormality ( Sokolow-Lyon , Cornell product , Romhilt-Estes ) Abnormal ECG When compared with ECG of 01-Dec-2023 01:09,  nonspecific  ST and T wave changes. Confirmed by Benjiman Core [phone]) on 12/23/2023 7:27:04 PM  Radiology DG Chest 2 View Result Date: 12/23/2023 CLINICAL DATA:  Shortness of breath EXAM: CHEST - 2 VIEW COMPARISON:  11/30/2023 FINDINGS: Cardiomegaly. Mediastinal contours within normal limits. Aortic atherosclerosis. Diffuse pulmonary interstitial prominence again noted, similar to prior study. Bibasilar opacities, favor atelectasis. Trace bilateral effusions suspected. No acute bony abnormality. IMPRESSION: Cardiomegaly. Stable diffuse pulmonary interstitial prominence which could reflect edema, atypical infection, or chronic lung disease. Small bilateral effusions with bibasilar opacities, favor atelectasis. Electronically Signed   By: Charlett Nose M.D.   On: 12/23/2023 19:16    Procedures Procedures    Medications Ordered in ED Medications  furosemide (LASIX) injection 40 mg (40 mg Intravenous Given 12/23/23 1955)    ED Course/ Medical Decision Making/ A&P                                 Medical Decision Making Amount and/or Complexity of Data Reviewed Labs: ordered. Radiology: ordered.  Risk Prescription drug management.   Patient with shortness of breath.  Occasional cough.  Differential diagnosis includes URI symptoms pneumonia,  CHF.  States her weight is up and does have edema on her legs and some rales and edema on x-ray.  Will give IV Lasix here.  Will add BNP.  Not hypoxic.  I reviewed recent PCP note and hospital discharge note.  Blood work reassuring.,  However BNP is elevated 2800.  Higher than her recent admission for CHF.  States she has to use 6 pillows to sleep at night.  Reviewing cardiology notes it appears he has been having issues previously with tolerating medicines.  I think she would benefit from admission to the hospital for inpatient diuresis.  Will discuss with hospitalist.        Final Clinical Impression(s) / ED Diagnoses Final diagnoses:  Congestive heart failure, unspecified HF chronicity, unspecified heart failure type [facility])    Rx / DC Orders ED Discharge Orders     None         Benjiman Core, MD 12/23/23 2050

## 2023-12-23 NOTE — ED Triage Notes (Signed)
Pt caox4 c/o increased SOB and bilateral lower leg swelling stating she has HF and was d/c from hospital 1/1 and that it has worsened since she was treated in hospital for same.

## 2023-12-23 NOTE — Plan of Care (Signed)
Spoke to Dr. Rubin Payor.  68 year old female with history of diabetes, hypertension, stroke, chronic HFrEF, breast cancer, CKD stage IIIa, CAD status post PCI presented to [facility] with shortness of breath and bilateral leg swelling. Chest x-ray showing stable diffuse pulmonary interstitial prominence which could reflect edema versus atypical infection versus chronic lung disease. Also showing small bilateral pleural effusions with bibasilar opacities favored to be atelectasis. BNP 2807, no fever or leukocytosis to suggest pneumonia, creatinine stable. Patient was given IV Lasix 40 mg in the ED. Not hypoxic.  Inpatient cardiac telemetry bed requested at [facility].  Discussed with Dr. Geraldo Pitter, IMTS to take over care when patient arrives to [facility].  Nursing staff, please page internal medicine teaching service as soon as the patient arrives to [facility].

## 2023-12-24 ENCOUNTER — Encounter (HOSPITAL_BASED_OUTPATIENT_CLINIC_OR_DEPARTMENT_OTHER): Payer: Self-pay | Admitting: Internal Medicine

## 2023-12-24 ENCOUNTER — Other Ambulatory Visit: Payer: Self-pay

## 2023-12-24 ENCOUNTER — Observation Stay (HOSPITAL_COMMUNITY): Payer: Medicare (Managed Care)

## 2023-12-24 DIAGNOSIS — Z91014 Allergy to mammalian meats: Secondary | ICD-10-CM | POA: Diagnosis not present

## 2023-12-24 DIAGNOSIS — Z006 Encounter for examination for normal comparison and control in clinical research program: Secondary | ICD-10-CM

## 2023-12-24 DIAGNOSIS — R64 Cachexia: Secondary | ICD-10-CM | POA: Diagnosis present

## 2023-12-24 DIAGNOSIS — Z7982 Long term (current) use of aspirin: Secondary | ICD-10-CM | POA: Diagnosis not present

## 2023-12-24 DIAGNOSIS — I130 Hypertensive heart and chronic kidney disease with heart failure and stage 1 through stage 4 chronic kidney disease, or unspecified chronic kidney disease: Secondary | ICD-10-CM | POA: Diagnosis present

## 2023-12-24 DIAGNOSIS — I083 Combined rheumatic disorders of mitral, aortic and tricuspid valves: Secondary | ICD-10-CM | POA: Diagnosis present

## 2023-12-24 DIAGNOSIS — Z681 Body mass index (BMI) 19 or less, adult: Secondary | ICD-10-CM | POA: Diagnosis not present

## 2023-12-24 DIAGNOSIS — Z8249 Family history of ischemic heart disease and other diseases of the circulatory system: Secondary | ICD-10-CM | POA: Diagnosis not present

## 2023-12-24 DIAGNOSIS — F17210 Nicotine dependence, cigarettes, uncomplicated: Secondary | ICD-10-CM | POA: Diagnosis present

## 2023-12-24 DIAGNOSIS — I509 Heart failure, unspecified: Secondary | ICD-10-CM | POA: Diagnosis present

## 2023-12-24 DIAGNOSIS — Z7902 Long term (current) use of antithrombotics/antiplatelets: Secondary | ICD-10-CM | POA: Diagnosis not present

## 2023-12-24 DIAGNOSIS — Z8673 Personal history of transient ischemic attack (TIA), and cerebral infarction without residual deficits: Secondary | ICD-10-CM | POA: Diagnosis not present

## 2023-12-24 DIAGNOSIS — I5020 Unspecified systolic (congestive) heart failure: Secondary | ICD-10-CM | POA: Diagnosis not present

## 2023-12-24 DIAGNOSIS — Z955 Presence of coronary angioplasty implant and graft: Secondary | ICD-10-CM | POA: Diagnosis not present

## 2023-12-24 DIAGNOSIS — N1831 Chronic kidney disease, stage 3a: Secondary | ICD-10-CM | POA: Diagnosis present

## 2023-12-24 DIAGNOSIS — Z95810 Presence of automatic (implantable) cardiac defibrillator: Secondary | ICD-10-CM | POA: Diagnosis not present

## 2023-12-24 DIAGNOSIS — I5023 Acute on chronic systolic (congestive) heart failure: Secondary | ICD-10-CM | POA: Diagnosis present

## 2023-12-24 DIAGNOSIS — Z79899 Other long term (current) drug therapy: Secondary | ICD-10-CM | POA: Diagnosis not present

## 2023-12-24 DIAGNOSIS — Z91199 Patient's noncompliance with other medical treatment and regimen due to unspecified reason: Secondary | ICD-10-CM | POA: Diagnosis not present

## 2023-12-24 DIAGNOSIS — E1122 Type 2 diabetes mellitus with diabetic chronic kidney disease: Secondary | ICD-10-CM | POA: Diagnosis present

## 2023-12-24 DIAGNOSIS — I5021 Acute systolic (congestive) heart failure: Secondary | ICD-10-CM | POA: Diagnosis not present

## 2023-12-24 DIAGNOSIS — H409 Unspecified glaucoma: Secondary | ICD-10-CM | POA: Diagnosis present

## 2023-12-24 DIAGNOSIS — Z888 Allergy status to other drugs, medicaments and biological substances status: Secondary | ICD-10-CM | POA: Diagnosis not present

## 2023-12-24 DIAGNOSIS — Z833 Family history of diabetes mellitus: Secondary | ICD-10-CM | POA: Diagnosis not present

## 2023-12-24 DIAGNOSIS — I3139 Other pericardial effusion (noninflammatory): Secondary | ICD-10-CM | POA: Diagnosis present

## 2023-12-24 DIAGNOSIS — E871 Hypo-osmolality and hyponatremia: Secondary | ICD-10-CM | POA: Diagnosis present

## 2023-12-24 DIAGNOSIS — I2510 Atherosclerotic heart disease of native coronary artery without angina pectoris: Secondary | ICD-10-CM | POA: Diagnosis present

## 2023-12-24 DIAGNOSIS — R1310 Dysphagia, unspecified: Secondary | ICD-10-CM | POA: Diagnosis present

## 2023-12-24 LAB — ECHOCARDIOGRAM COMPLETE
AR max vel: 2.16 cm2
AV Area VTI: 2.29 cm2
AV Area mean vel: 2.32 cm2
AV Mean grad: 4 mm[Hg]
AV Peak grad: 9.5 mm[Hg]
Ao pk vel: 1.54 m/s
Area-P 1/2: 4.33 cm2
Calc EF: 36.8 %
Height: 60 in
P 1/2 time: 422 ms
S' Lateral: 4.1 cm
Single Plane A2C EF: 41.4 %
Single Plane A4C EF: 35 %
Weight: 1411.2 [oz_av]

## 2023-12-24 LAB — MRSA NEXT GEN BY PCR, NASAL: MRSA by PCR Next Gen: NEGATIVE — AB

## 2023-12-24 LAB — CBG MONITORING, ED: Glucose-Capillary: 131 mg/dL — ABNORMAL HIGH (ref 70–99)

## 2023-12-24 LAB — GLUCOSE, CAPILLARY: Glucose-Capillary: 93 mg/dL (ref 70–99)

## 2023-12-24 LAB — HIV ANTIBODY (ROUTINE TESTING W REFLEX): HIV Screen 4th Generation wRfx: NONREACTIVE

## 2023-12-24 MED ORDER — RIVAROXABAN 10 MG PO TABS
10.0000 mg | ORAL_TABLET | Freq: Every day | ORAL | Status: DC
  Administered 2023-12-24 – 2023-12-25 (×2): 10 mg via ORAL
  Filled 2023-12-24 (×3): qty 1

## 2023-12-24 MED ORDER — TIMOLOL MALEATE 0.5 % OP SOLN
1.0000 [drp] | Freq: Every morning | OPHTHALMIC | Status: DC
Start: 2023-12-24 — End: 2023-12-26
  Administered 2023-12-25: 1 [drp] via OPHTHALMIC
  Filled 2023-12-24: qty 5

## 2023-12-24 MED ORDER — INSULIN ASPART 100 UNIT/ML IJ SOLN: 0.0000 [IU] | Freq: Three times a day (TID) | INTRAMUSCULAR | Status: DC

## 2023-12-24 MED ORDER — DORZOLAMIDE HCL 2 % OP SOLN
1.0000 [drp] | Freq: Every day | OPHTHALMIC | Status: DC
  Administered 2023-12-24 – 2023-12-25 (×2): 1 [drp] via OPHTHALMIC
  Filled 2023-12-24: qty 10

## 2023-12-24 MED ORDER — THIAMINE MONONITRATE 100 MG PO TABS
100.0000 mg | ORAL_TABLET | Freq: Every day | ORAL | Status: DC
  Administered 2023-12-24 – 2023-12-25 (×2): 100 mg via ORAL
  Filled 2023-12-24 (×3): qty 1

## 2023-12-24 MED ORDER — ENSURE ENLIVE PO LIQD
237.0000 mL | Freq: Three times a day (TID) | ORAL | Status: DC
  Administered 2023-12-24 – 2023-12-26 (×5): 237 mL via ORAL

## 2023-12-24 MED ORDER — LATANOPROST 0.005 % OP SOLN
1.0000 [drp] | Freq: Every day | OPHTHALMIC | Status: DC
  Administered 2023-12-25: 1 [drp] via OPHTHALMIC
  Filled 2023-12-24: qty 2.5

## 2023-12-24 MED ORDER — FUROSEMIDE 10 MG/ML IJ SOLN
40.0000 mg | Freq: Once | INTRAMUSCULAR | Status: AC
  Administered 2023-12-24: 40 mg via INTRAVENOUS
  Filled 2023-12-24: qty 4

## 2023-12-24 MED ORDER — NITROGLYCERIN 2 % TD OINT
1.0000 [in_us] | TOPICAL_OINTMENT | Freq: Once | TRANSDERMAL | Status: AC
  Administered 2023-12-24: 1 [in_us] via TOPICAL
  Filled 2023-12-24: qty 1

## 2023-12-24 NOTE — H&P (Signed)
Date: 12/24/2023               Patient Name:  Amy Howell MRN: [id number]  DOB: March 03, 1956 Age / Sex: 68 y.o., female   PCP: Rana Snare, DO         Medical Service: Internal Medicine Teaching Service         Attending Physician: Dr. Mercie Eon, MD    First Contact: Dr. Laretta Bolster Pager: [phone]  Second Contact: Dr. Rana Snare Pager: [phone]       After Hours (After 5p/  First Contact Pager: [phone]  weekends / holidays): Second Contact Pager: [phone]   Chief Complaint: LLE swelling  History of Present Illness: Amy Howell is a 68 y.o. F with past medical history of CVA, T2DM, HTN, HFrEF who presented for evaluation after sudden onset of her L foot. She states that she has never had swelling like this before, and that the swelling extended up to her thigh. The RLE was swollen as well but to a much lesser degree. She also has had productive cough. She denies weight gain, stating she is actually trying to gain weight, and denies needing increased head elevation in order to sleep. She uses 7 pillows under her head at night and this has not changed recently. She denies large volume fluid intake due to dysphagia. She has taken all of her medications with the exception of spironolactone which she stopped 3 days ago and multivitamin.  Overnight she was given IV lasix 40 mg and reports significant improvement in both her LE edema and chest congestion.  Past Medical History: Past Medical History:  Diagnosis Date   Cataracts, bilateral    Cerebral infarction due to carotid artery stenosis (HCC)    Diabetes mellitus type 2 in nonobese (HCC)    Glaucoma    Hypertension    Hypertensive emergency 11/22/2022   Stroke [facility])    no residual, "series of mini strokes"   Tobacco abuse    Vertigo    Past Surgical History: Past Surgical History:  Procedure Laterality Date   ABDOMINAL HYSTERECTOMY     CARDIAC DEFIBRILLATOR PLACEMENT     CORONARY STENT INTERVENTION N/A  11/26/2022   Procedure: CORONARY STENT INTERVENTION;  Surgeon: Corky Crafts, MD;  Location: [facility] INVASIVE CV LAB;  Service: Cardiovascular;  Laterality: N/A;   gum operation     Implantable loop recorder removal  02/01/2019   MDT Reveal LINQ explantation in office by JA   IR 3D INDEPENDENT WKST  05/07/2017   IR 3D INDEPENDENT WKST  09/17/2023   IR ANGIO INTRA EXTRACRAN SEL COM CAROTID INNOMINATE BILAT MOD SED  04/23/2017   IR ANGIO INTRA EXTRACRAN SEL COM CAROTID INNOMINATE BILAT MOD SED  08/06/2017   IR ANGIO INTRA EXTRACRAN SEL COM CAROTID INNOMINATE UNI R MOD SED  05/07/2017   IR ANGIO INTRA EXTRACRAN SEL INTERNAL CAROTID BILAT MOD SED  09/17/2023   IR ANGIO VERTEBRAL SEL VERTEBRAL BILAT MOD SED  04/23/2017   IR ANGIO VERTEBRAL SEL VERTEBRAL BILAT MOD SED  08/06/2017   IR ANGIOGRAM FOLLOW UP STUDY  05/07/2017   IR ANGIOGRAM SELECTIVE EACH ADDITIONAL VESSEL  05/07/2017   IR RADIOLOGIST EVAL & MGMT  04/16/2017   IR RADIOLOGIST EVAL & MGMT  05/29/2017   IR RADIOLOGIST EVAL & MGMT  09/29/2023   IR RADIOLOGIST EVAL & MGMT  11/05/2023   IR TRANSCATH/EMBOLIZ  05/07/2017   LOOP RECORDER INSERTION N/A 03/28/2017   Procedure: Loop Recorder Insertion;  Surgeon: Hillis Range, MD;  Location: [facility] INVASIVE CV LAB;  Service: Cardiovascular;  Laterality: N/A;   MASTECTOMY W/ SENTINEL NODE BIOPSY Right 06/25/2022   Procedure: RIGHT MASTECTOMY WITH AXILLARY SENTINEL LYMPH NODE BIOPSY;  Surgeon: Manus Rudd, MD;  Location: [facility];  Service: General;  Laterality: Right;   RADIOLOGY WITH ANESTHESIA N/A 05/07/2017   Procedure: EMBOLIZATION;  Surgeon: Julieanne Cotton, MD;  Location: [facility] OR;  Service: Radiology;  Laterality: N/A;   RADIOLOGY WITH ANESTHESIA N/A 08/06/2017   Procedure: RADIOLOGY WITH ANESTHESIA STENTING;  Surgeon: Julieanne Cotton, MD;  Location: [facility] OR;  Service: Radiology;  Laterality: N/A;   RIGHT/LEFT HEART CATH AND CORONARY ANGIOGRAPHY N/A 11/26/2022   Procedure: RIGHT/LEFT HEART  CATH AND CORONARY ANGIOGRAPHY;  Surgeon: Corky Crafts, MD;  Location: [facility] INVASIVE CV LAB;  Service: Cardiovascular;  Laterality: N/A;   TEE WITHOUT CARDIOVERSION N/A 03/28/2017   Procedure: TRANSESOPHAGEAL ECHOCARDIOGRAM (TEE);  Surgeon: Lewayne Bunting, MD;  Location: [facility] ENDOSCOPY;  Service: Cardiovascular;  Laterality: N/A;   Meds:  Current Meds  Medication Sig   aspirin 81 MG chewable tablet Chew 1 tablet (81 mg total) by mouth daily.   carvedilol (COREG) 12.5 MG tablet Take 1 tablet (12.5 mg total) by mouth 2 (two) times daily with a meal.   dorzolamide (TRUSOPT) 2 % ophthalmic solution Instill 1 drop into both eyes twice a day (Patient taking differently: Place 1 drop into both eyes daily.)   latanoprost (XALATAN) 0.005 % ophthalmic solution Instill 1 drop into both eyes at bedtime   spironolactone (ALDACTONE) 25 MG tablet Take 1/2 tablet (12.5 mg total) by mouth daily.   thiamine (VITAMIN B-1) 100 MG tablet Take 1 tablet (100 mg total) by mouth daily.   ticagrelor (BRILINTA) 90 MG TABS tablet Take 1 tablet (90 mg total) by mouth 2 (two) times daily.   timolol (TIMOPTIC) 0.5 % ophthalmic solution Place 1 drop into both eyes every morning.   Allergies: Allergies as of 12/23/2023 - Reviewed 12/23/2023  Allergen Reaction Noted   Bidil [isosorb dinitrate-hydralazine] Other (See Comments) 09/24/2023   Lisinopril Other (See Comments) 03/23/2020   Pork-derived products Hives 11/26/2022   Rosuvastatin Other (See Comments) 03/17/2023   Statins Other (See Comments) 12/01/2023   Family History:  Family History  Problem Relation Age of Onset   Dementia Mother    Kidney disease Father    Hypertension Father    Diabetes Father    Heart disease Father    Lung cancer Maternal Aunt        dx after 64   Prostate cancer Other        MGM's brother   Breast cancer Neg Hx    Colon cancer Neg Hx    Liver disease Neg Hx    Esophageal cancer Neg Hx    Social History: Patient currently  lives at home with her daughter is retired from Neurosurgeon distribution at age 68.  Mostly independent on her ADLs/IADLs requires assistance.  She follows up with Dr. Rana Snare at the internal medicine center for primary care needs.  Been smoking about half a pack a day for over 50 years, says she is down to about once a cig every now and then.  However denies use of alcohol or any other illicit drugs   Review of Systems: A complete ROS was negative except as per HPI.   Physical Exam: Blood pressure (!) 164/80, pulse 89, temperature 97.7 F (36.5 C), temperature source Oral, resp. rate 13,  weight 40 kg, SpO2 100%. Physical Exam Constitutional:      General: She is not in acute distress.    Appearance: She is not ill-appearing.  Cardiovascular:     Rate and Rhythm: Normal rate and regular rhythm.     Comments: JVD to 2 cm above clavicle Pulmonary:     Effort: Pulmonary effort is normal. No accessory muscle usage.     Comments: Bibasilar crackles Abdominal:     Palpations: Abdomen is soft.     Tenderness: There is no abdominal tenderness.  Musculoskeletal:     Right lower leg: No edema.     Left lower leg: Edema (trace pitting edema below the knee; residual non-pitting edema, minimal, just proximal to knee) present.  Skin:    General: Skin is warm and dry.  Neurological:     General: No focal deficit present.     Mental Status: She is alert.    EKG: NSR with LVH.  CXR: Cardiomegaly. 2. Stable diffuse pulmonary interstitial prominence which could reflect edema, atypical infection, or chronic lung disease. 3. Small bilateral effusions with bibasilar opacities, favor atelectasis.  Assessment & Plan by Problem: Active Problems:   Acute exacerbation of CHF (congestive heart failure) (HCC)  Acute CHF exacerbation (LV EF 30-35%) BNP has been persistently elevated >2000 over last several months when checked, today with elevation compared to prior admission at 2807.1. Excellent  response clinically to IV lasix since initial presentation with subjective improvement as well. She has several medication intolerances making it difficult to manage her heart failure medically in addition to compliance concerns. Last echocardiogram 10/2023 noted LV EF 30-35% with moderate pericardial effusion (no significant change), aortic, mitral, and tricuspid regurgitation. I question if she has had worsening of valvular regurgitation leading to recurrent fluid overload versus medication noncompliance resulting in volume overload. Plan: -Repeat echocardiogram -Additional IV lasix 40 mg x1 -Telemetry monitoring -Strict I's & O's, daily weights -Fluid restriction  CKD stage 3a vs AKI Renal function has been consistently in CKD stage 3a range over the last several months, though these labs were collected while she was acutely ill. Compared to last hospitalization, renal function is stable. I do not suspect cardiorenal syndrome based on this trend. Plan: -Recommend OP work-up; consider UA, urine microalbumin/creatinine ratio, renal ultrasound -Avoid nephrotoxic agents -Trend BMP  HTN Not compliant with home spironolactone as it "makes her forget things." Last dose was 3 days ago. BP has been elevated since arrival. I do not see that she has been evaluated for secondary hypertension. Home carvedilol was initiated this morning.  Plan: -Consider checking plasma renin/aldosterone activity level -Hold carvedilol in setting of acute heart failure exacerbation  Hx CVA Plan: -Continue ASA 81 mg daily -Continue brilinta 90 mg PID  Type 2 diabetes mellitus Plan: -CBG TID with meals, SSI TID with meals -CBG goal 140-180  Dispo: Admit patient to Observation with expected length of stay less than 2 midnights.  SignedChamp Mungo, DO 12/24/2023, 10:44 AM  After 5pm on weekdays and 1pm on weekends: On Call pager: [phone]

## 2023-12-24 NOTE — ED Notes (Signed)
[facility] at the bedside. All questions answered. Care handoff given to inpatient unit by previous RN. NAD noted from patient. Comfortable and ready for transportation.

## 2023-12-24 NOTE — Plan of Care (Signed)
  Problem: Education: Goal: Knowledge of General Education information will improve Description: Including pain rating scale, medication(s)/side effects and non-pharmacologic comfort measures Outcome: Progressing   Problem: Health Behavior/Discharge Planning: Goal: Ability to manage health-related needs will improve Outcome: Progressing   Problem: Clinical Measurements: Goal: Ability to maintain clinical measurements within normal limits will improve Outcome: Progressing Goal: Will remain free from infection Outcome: Progressing Goal: Diagnostic test results will improve Outcome: Progressing Goal: Respiratory complications will improve Outcome: Progressing Goal: Cardiovascular complication will be avoided Outcome: Progressing   Problem: Activity: Goal: Risk for activity intolerance will decrease Outcome: Progressing   Problem: Nutrition: Goal: Adequate nutrition will be maintained Outcome: Progressing   Problem: Coping: Goal: Level of anxiety will decrease Outcome: Progressing   Problem: Elimination: Goal: Will not experience complications related to bowel motility Outcome: Progressing Goal: Will not experience complications related to urinary retention Outcome: Progressing   Problem: Pain Managment: Goal: General experience of comfort will improve and/or be controlled Outcome: Progressing   Problem: Safety: Goal: Ability to remain free from injury will improve Outcome: Progressing   Problem: Skin Integrity: Goal: Risk for impaired skin integrity will decrease Outcome: Progressing   Problem: Nutritional: Goal: Maintenance of adequate nutrition will improve Outcome: Progressing   Problem: Tissue Perfusion: Goal: Adequacy of tissue perfusion will improve Outcome: Progressing   Problem: Education: Goal: Ability to verbalize understanding of medication therapies will improve Outcome: Progressing   Problem: Activity: Goal: Capacity to carry out activities  will improve Outcome: Progressing   Problem: Cardiac: Goal: Ability to achieve and maintain adequate cardiopulmonary perfusion will improve Outcome: Progressing

## 2023-12-24 NOTE — Hospital Course (Addendum)
Amy Howell is a 68 y.o. with a pertinent PMH of CVA, type 2 diabetes, hypertension, HFrEF,, who presented with sudden onset of left foot swelling, and admitted for acute HF exacerbation.   Acute CHF exacerbation (LV EF 30-35%) On presentation, she had left leg >right swelling and productive cough, BNP> 2800, Satting on room air.  On exam, had bibasilar crackles, trace pitting edema left> right. She was diuresed with IV Lasix, with good urinary output.  Echocardiogram showed LVEF 35-40% with global hypokinesis, left atrial size severely dilated ( unchanged as compared to prior), and new findings of severely elevated pulmonary arterial pressure and moderate to severe mitral valve regurgitation. She was seen by cardiology, recommending torsemide 20 mg, and low-dose digoxin M/W/F for RV dysfunction.  Not considering surgical valve replacement, as patient would not be agreeable to it.  Patient with a chronic history of noncompliance, I query if patient would even take the torsemide 20 mg.   [facility] follow up - Continue torsemide 20 mg - Digoxin 0.125 mg M/W/F.  HTN Held carvedilol due to acute CHF exacerbation.  Restarted on the day of discharge, overnight EKG without bradycardia. Will follow-up on renin/Aldo, due to concern for secondary hypertension.  Patient should be on spironolactone 25 mg, however is noncompliant with it.  - Continue carvedilol 12.5 mg - Cw spirolactone 25 mg. - Follow-up on renin/Aldo.  CKD stage 3a SCr mildly elevated improved, 1.39>>1.18.  Encouraged good oral hydration.  Hx CVA Continued with aspirin 81 mg and Brilinta 90 mg twice daily

## 2023-12-24 NOTE — Progress Notes (Signed)
Patient arrived to unit. Patient refused CHG bath states allergic will cause her to break out. Pt also states is allergic to whatever is use to wash gowns and sheets. Has here own hospital gowns from previous stays that daughter takes home and washes. IMTS also paged to be notified of patients arrival to unit- [place], PA returned call says will callback after looking over patient.

## 2023-12-24 NOTE — ED Notes (Signed)
Family member notified transportation is at the bedside. Family member will be en route to hospital instead.

## 2023-12-24 NOTE — Progress Notes (Signed)
Patient refusing CBG checks, states she is not diabetic.  Education given, patient refused.

## 2023-12-24 NOTE — Research (Signed)
SITE: 050     Subject # [id number]    Subprotocol: A  Inclusion Criteria  Patients who meet all of the following criteria are eligible for enrollment as study participants:  Yes No  Age > 70 years old X   Eligible to wear Holter Study X    Exclusion Criteria  Patients who meet any of these criteria are not eligible for enrollment as study participants: Yes No  1. Receiving any mechanical (respiratory or circulatory) or renal support therapy at Screening or during Visit #1.  X  2.  Any other conditions that in the opinion of the investigators are likely to prevent compliance with the study protocol or pose a safety concern if the subject participates in the study.  X  3. Poor tolerance, namely susceptible to severe skin allergies from ECG adhesive patch application.  X   Protocol: REV H                                     Residential Zip code [address] (First 3 digits ONLY)                                             [facility] Informed Consent   Subject Name: Amy Howell  Subject met inclusion and exclusion criteria.  The informed consent form, study requirements and expectations were reviewed with the subject. Subject had opportunity to read consent and questions and concerns were addressed prior to the signing of the consent form.  The subject verbalized understanding of the trial requirements.  The subject agreed to participate in the [facility] EF ACT trial and signed the informed consent at 16:40 on 24-Dec-2023. The informed consent was obtained prior to performance of any protocol-specific procedures for the subject.  A copy of the signed informed consent was given to the subject and a copy was placed in the subject's medical record.   Amy Howell         No current facility-administered medications for this visit. No current outpatient medications on file.  Facility-Administered Medications Ordered in Other Visits:    aspirin chewable tablet 81 mg, 81 mg, Oral, Daily, Benjiman Core, MD, 81 mg at 12/24/23 1204   dorzolamide (TRUSOPT) 2 % ophthalmic solution 1 drop, 1 drop, Both Eyes, Daily, Champ Mungo, DO, 1 drop at 12/24/23 1205   feeding supplement (ENSURE ENLIVE / ENSURE PLUS) liquid 237 mL, 237 mL, Oral, TID BM, Champ Mungo, DO, 237 mL at 12/24/23 1624   insulin aspart (novoLOG) injection 0-9 Units, 0-9 Units, Subcutaneous, TID WC, Dean, Irving Burton, DO   latanoprost (XALATAN) 0.005 % ophthalmic solution 1 drop, 1 drop, Both Eyes, QHS, Dean, Irving Burton, DO   rivaroxaban (XARELTO) tablet 10 mg, 10 mg, Oral, Daily, Champ Mungo, DO, 10 mg at 12/24/23 1204   thiamine (VITAMIN B1) tablet 100 mg, 100 mg, Oral, Daily, Champ Mungo, DO, 100 mg at 12/24/23 1204   ticagrelor (BRILINTA) tablet 90 mg, 90 mg, Oral, BID, Benjiman Core, MD, 90 mg at 12/24/23 1204   timolol (TIMOPTIC) 0.5 % ophthalmic solution 1 drop, 1 drop, Both Eyes, q morning, Champ Mungo, DO

## 2023-12-24 NOTE — Plan of Care (Signed)
  Problem: Education: Goal: Knowledge of General Education information will improve Description Including pain rating scale, medication(s)/side effects and non-pharmacologic comfort measures Outcome: Progressing   Problem: Clinical Measurements: Goal: Respiratory complications will improve Outcome: Progressing Goal: Cardiovascular complication will be avoided Outcome: Progressing   Problem: Nutrition: Goal: Adequate nutrition will be maintained Outcome: Progressing   Problem: Safety: Goal: Ability to remain free from injury will improve Outcome: Progressing   

## 2023-12-25 DIAGNOSIS — E871 Hypo-osmolality and hyponatremia: Secondary | ICD-10-CM

## 2023-12-25 DIAGNOSIS — I5020 Unspecified systolic (congestive) heart failure: Secondary | ICD-10-CM

## 2023-12-25 LAB — BASIC METABOLIC PANEL
Anion gap: 10 (ref 5–15)
BUN: 19 mg/dL (ref 8–23)
CO2: 24 mmol/L (ref 22–32)
Calcium: 8.1 mg/dL — ABNORMAL LOW (ref 8.9–10.3)
Chloride: 96 mmol/L — ABNORMAL LOW (ref 98–111)
Creatinine, Ser: 1.39 mg/dL — ABNORMAL HIGH (ref 0.44–1.00)
GFR, Estimated: 41 mL/min — ABNORMAL LOW (ref >60)
Glucose, Bld: 96 mg/dL (ref 70–99)
Potassium: 3.4 mmol/L — ABNORMAL LOW (ref 3.5–5.1)
Sodium: 130 mmol/L — ABNORMAL LOW (ref 135–145)

## 2023-12-25 LAB — CBC
HCT: 33.8 % — ABNORMAL LOW (ref 36.0–46.0)
Hemoglobin: 10.6 g/dL — ABNORMAL LOW (ref 12.0–15.0)
MCH: 28.8 pg (ref 26.0–34.0)
MCHC: 31.4 g/dL (ref 30.0–36.0)
MCV: 91.8 fL (ref 80.0–100.0)
Platelets: 179 10*3/uL (ref 150–400)
RBC: 3.68 MIL/uL — ABNORMAL LOW (ref 3.87–5.11)
RDW: 16 % — ABNORMAL HIGH (ref 11.5–15.5)
WBC: 5.6 10*3/uL (ref 4.0–10.5)
nRBC: 0 % (ref 0.0–0.2)

## 2023-12-25 LAB — MAGNESIUM: Magnesium: 1.6 mg/dL — ABNORMAL LOW (ref 1.7–2.4)

## 2023-12-25 MED ORDER — DIGOXIN 125 MCG PO TABS
0.1250 mg | ORAL_TABLET | ORAL | Status: DC
  Administered 2023-12-26: 0.125 mg via ORAL
  Filled 2023-12-25: qty 1

## 2023-12-25 MED ORDER — POLYETHYLENE GLYCOL 3350 17 G PO PACK: 17.0000 g | PACK | Freq: Every day | ORAL | Status: DC | PRN

## 2023-12-25 MED ORDER — POTASSIUM CHLORIDE CRYS ER 20 MEQ PO TBCR
40.0000 meq | EXTENDED_RELEASE_TABLET | Freq: Once | ORAL | Status: AC
  Administered 2023-12-25: 40 meq via ORAL
  Filled 2023-12-25: qty 2

## 2023-12-25 MED ORDER — TORSEMIDE 20 MG PO TABS
20.0000 mg | ORAL_TABLET | Freq: Every day | ORAL | Status: DC
Start: 2023-12-26 — End: 2023-12-26
  Filled 2023-12-25: qty 1

## 2023-12-25 MED ORDER — POTASSIUM CHLORIDE CRYS ER 10 MEQ PO TBCR
10.0000 meq | EXTENDED_RELEASE_TABLET | Freq: Every day | ORAL | Status: DC
Start: 2023-12-25 — End: 2023-12-26
  Administered 2023-12-25: 10 meq via ORAL
  Filled 2023-12-25 (×2): qty 1

## 2023-12-25 MED ORDER — MAGNESIUM CHLORIDE 64 MG PO TBEC
1.0000 | DELAYED_RELEASE_TABLET | Freq: Every day | ORAL | Status: DC
  Administered 2023-12-25: 64 mg via ORAL
  Filled 2023-12-25 (×2): qty 1

## 2023-12-25 NOTE — Plan of Care (Signed)
  Problem: Education: Goal: Knowledge of General Education information will improve Description: Including pain rating scale, medication(s)/side effects and non-pharmacologic comfort measures Outcome: Progressing   Problem: Health Behavior/Discharge Planning: Goal: Ability to manage health-related needs will improve Outcome: Progressing   Problem: Clinical Measurements: Goal: Ability to maintain clinical measurements within normal limits will improve Outcome: Progressing Goal: Will remain free from infection Outcome: Progressing Goal: Diagnostic test results will improve Outcome: Progressing Goal: Respiratory complications will improve Outcome: Progressing Goal: Cardiovascular complication will be avoided Outcome: Progressing   Problem: Activity: Goal: Risk for activity intolerance will decrease Outcome: Progressing   Problem: Nutrition: Goal: Adequate nutrition will be maintained Outcome: Progressing   Problem: Coping: Goal: Level of anxiety will decrease Outcome: Progressing   Problem: Elimination: Goal: Will not experience complications related to bowel motility Outcome: Progressing Goal: Will not experience complications related to urinary retention Outcome: Progressing   Problem: Pain Managment: Goal: General experience of comfort will improve and/or be controlled Outcome: Progressing   Problem: Safety: Goal: Ability to remain free from injury will improve Outcome: Progressing   Problem: Skin Integrity: Goal: Risk for impaired skin integrity will decrease Outcome: Progressing   Problem: Education: Goal: Ability to describe self-care measures that may prevent or decrease complications (Diabetes Survival Skills Education) will improve Outcome: Progressing Goal: Individualized Educational Video(s) Outcome: Progressing   Problem: Coping: Goal: Ability to adjust to condition or change in health will improve Outcome: Progressing   Problem: Fluid  Volume: Goal: Ability to maintain a balanced intake and output will improve Outcome: Progressing   Problem: Health Behavior/Discharge Planning: Goal: Ability to identify and utilize available resources and services will improve Outcome: Progressing Goal: Ability to manage health-related needs will improve Outcome: Progressing   Problem: Metabolic: Goal: Ability to maintain appropriate glucose levels will improve Outcome: Progressing   Problem: Nutritional: Goal: Maintenance of adequate nutrition will improve Outcome: Progressing Goal: Progress toward achieving an optimal weight will improve Outcome: Progressing   Problem: Skin Integrity: Goal: Risk for impaired skin integrity will decrease Outcome: Progressing   Problem: Tissue Perfusion: Goal: Adequacy of tissue perfusion will improve Outcome: Progressing   Problem: Education: Goal: Ability to verbalize understanding of medication therapies will improve Outcome: Progressing Goal: Individualized Educational Video(s) Outcome: Progressing   Problem: Activity: Goal: Capacity to carry out activities will improve Outcome: Progressing   Problem: Cardiac: Goal: Ability to achieve and maintain adequate cardiopulmonary perfusion will improve Outcome: Progressing

## 2023-12-25 NOTE — Plan of Care (Signed)
  Problem: Education: Goal: Knowledge of General Education information will improve Description: Including pain rating scale, medication(s)/side effects and non-pharmacologic comfort measures Outcome: Progressing   Problem: Health Behavior/Discharge Planning: Goal: Ability to manage health-related needs will improve Outcome: Progressing   Problem: Clinical Measurements: Goal: Ability to maintain clinical measurements within normal limits will improve Outcome: Progressing Goal: Will remain free from infection Outcome: Progressing Goal: Respiratory complications will improve Outcome: Progressing Goal: Cardiovascular complication will be avoided Outcome: Progressing   Problem: Nutrition: Goal: Adequate nutrition will be maintained Outcome: Progressing   Problem: Pain Managment: Goal: General experience of comfort will improve and/or be controlled Outcome: Progressing   Problem: Coping: Goal: Ability to adjust to condition or change in health will improve Outcome: Not Progressing   Problem: Fluid Volume: Goal: Ability to maintain a balanced intake and output will improve Outcome: Progressing   Problem: Health Behavior/Discharge Planning: Goal: Ability to identify and utilize available resources and services will improve Outcome: Not Progressing   Problem: Education: Goal: Ability to verbalize understanding of medication therapies will improve Outcome: Progressing   Problem: Activity: Goal: Capacity to carry out activities will improve Outcome: Progressing   Problem: Cardiac: Goal: Ability to achieve and maintain adequate cardiopulmonary perfusion will improve Outcome: Progressing

## 2023-12-25 NOTE — Progress Notes (Addendum)
HD#1 Subjective:  Overnight Events: No acute event overnight.  Patient examined at the bedside, left leg swelling has improved.  Denies worsening shortness of breath.  Objective:  Vital signs in last 24 hours: Vitals:   12/24/23 2257 12/25/23 0602 12/25/23 0734 12/25/23 1111  BP: (!) 175/79 (!) 150/64 (!) 157/80 (!) 141/60  Pulse: 88 82 87 80  Resp: 16 (!) 24 20 16   Temp: 98 F (36.7 C) 98.1 F (36.7 C) 98.1 F (36.7 C) 98.2 F (36.8 C)  TempSrc: Oral Oral Oral Oral  SpO2: 100% 95% 99% 95%  Weight:  38.8 kg    Height:       Supplemental O2: Room Air SpO2: 95 %   Physical Exam:   General: ill-appearing, cachectic, NAD. CV: RRR. No m/r/g. No LE edema. No JVD elevation. Pulmonary: Lungs CTAB. Normal effort. No wheezing or rales. Abdominal: Soft, nontender, nondistended. Normal bowel sounds. MSK: Mild trace edema of left leg. Skin: Warm and dry. No obvious rash or lesions.  Filed Weights   12/24/23 0848 12/25/23 0602  Weight: 40 kg 38.8 kg     Intake/Output Summary (Last 24 hours) at 12/25/2023 1417 Last data filed at 12/25/2023 0734 Gross per 24 hour  Intake 600 ml  Output 1600 ml  Net -1000 ml   Net IO Since Admission: -1,550 mL [12/25/23 1417]  Recent Labs    12/24/23 0757 12/24/23 1618  GLUCAP 131* 93     Pertinent Labs:    Latest Ref Rng & Units 12/25/2023    2:23 AM 12/23/2023    6:26 PM 12/11/2023   10:34 AM  CBC  WBC 4.0 - 10.5 K/uL 5.6  4.0  4.4   Hemoglobin 12.0 - 15.0 g/dL 14.7  82.9  56.2   Hematocrit 36.0 - 46.0 % 33.8  39.4  37.0   Platelets 150 - 400 K/uL 179  156  202        Latest Ref Rng & Units 12/25/2023    2:23 AM 12/23/2023    6:26 PM 12/11/2023   10:34 AM  CMP  Glucose 70 - 99 mg/dL 96  130  93   BUN 8 - 23 mg/dL 19  23  21    Creatinine 0.44 - 1.00 mg/dL 8.65  7.84  6.96   Sodium 135 - 145 mmol/L 130  136  138   Potassium 3.5 - 5.1 mmol/L 3.4  4.5  5.1   Chloride 98 - 111 mmol/L 96  106  106   CO2 22 - 32 mmol/L 24  22   20    Calcium 8.9 - 10.3 mg/dL 8.1  8.7  8.9     Imaging: ECHOCARDIOGRAM COMPLETE Result Date: 12/24/2023    ECHOCARDIOGRAM REPORT   Patient Name:   ANNEISHA MUNZER Date of Exam: 12/24/2023 Medical Rec #:  [id number]     Height:       60.0 in Accession #:    [id number]    Weight:       88.2 lb Date of Birth:  1956-10-12      BSA:          1.318 m Patient Age:    68 years      BP:           158/76 mmHg Patient Gender: F             HR:           80 bpm. Exam Location:  Inpatient Procedure: 2D Echo,  Cardiac Doppler and Color Doppler Indications:    CHF  History:        Patient has prior history of Echocardiogram examinations, most                 recent 10/02/2023. CHF; Risk Factors:Diabetes, Hypertension and                 Dyslipidemia.  Sonographer:    Melton Krebs RDCS, FE, PE Referring Phys: [id number] JULIE MACHEN IMPRESSIONS  1. Left ventricular ejection fraction, by estimation, is 35 to 40%. The left ventricle has moderately decreased function. The left ventricle demonstrates global hypokinesis. There is mild left ventricular hypertrophy. Left ventricular diastolic parameters are consistent with Grade II diastolic dysfunction (pseudonormalization). Elevated left atrial pressure.  2. Right ventricular systolic function is mildly reduced. The right ventricular size is normal. There is severely elevated pulmonary artery systolic pressure. The estimated right ventricular systolic pressure is 65.7 mmHg.  3. Left atrial size was severely dilated.  4. Right atrial size was mildly dilated.  5. Moderate pericardial effusion. Meaures 1.8cm adjacent to RV. There is no evidence of cardiac tamponade. Fixed/dilated IVC but no RA/RV collapse seen  6. The mitral valve is abnormal. Moderate to severe mitral valve regurgitation.  7. Tricuspid valve regurgitation is moderate.  8. The aortic valve is tricuspid. Aortic valve regurgitation is moderate. No aortic stenosis is present.  9. The inferior vena cava is dilated in size with  <50% respiratory variability, suggesting right atrial pressure of 15 mmHg. FINDINGS  Left Ventricle: Left ventricular ejection fraction, by estimation, is 35 to 40%. The left ventricle has moderately decreased function. The left ventricle demonstrates global hypokinesis. The left ventricular internal cavity size was normal in size. There is mild left ventricular hypertrophy. Left ventricular diastolic parameters are consistent with Grade II diastolic dysfunction (pseudonormalization). Elevated left atrial pressure. Right Ventricle: The right ventricular size is normal. No increase in right ventricular wall thickness. Right ventricular systolic function is mildly reduced. There is severely elevated pulmonary artery systolic pressure. The tricuspid regurgitant velocity is 3.56 m/s, and with an assumed right atrial pressure of 15 mmHg, the estimated right ventricular systolic pressure is 65.7 mmHg. Left Atrium: Left atrial size was severely dilated. Right Atrium: Right atrial size was mildly dilated. Pericardium: A moderately sized pericardial effusion is present. There is no evidence of cardiac tamponade. Mitral Valve: The mitral valve is abnormal. Moderate to severe mitral valve regurgitation. Tricuspid Valve: The tricuspid valve is normal in structure. Tricuspid valve regurgitation is moderate. Aortic Valve: The aortic valve is tricuspid. Aortic valve regurgitation is moderate. Aortic regurgitation PHT measures 422 msec. No aortic stenosis is present. Aortic valve mean gradient measures 4.0 mmHg. Aortic valve peak gradient measures 9.5 mmHg. Aortic valve area, by VTI measures 2.29 cm. Pulmonic Valve: The pulmonic valve was not well visualized. Pulmonic valve regurgitation is trivial. Aorta: The aortic root is normal in size and structure. Venous: The inferior vena cava is dilated in size with less than 50% respiratory variability, suggesting right atrial pressure of 15 mmHg. IAS/Shunts: The interatrial septum was  not well visualized.  LEFT VENTRICLE PLAX 2D LVIDd:         4.70 cm     Diastology LVIDs:         4.10 cm     LV e' medial:    2.76 cm/s LV PW:         1.30 cm     LV E/e' medial:  35.7 LV  IVS:        1.10 cm     LV e' lateral:   4.05 cm/s LVOT diam:     2.00 cm     LV E/e' lateral: 24.3 LV SV:         53 LV SV Index:   41 LVOT Area:     3.14 cm  LV Volumes (MOD) LV vol d, MOD A2C: 87.4 ml LV vol d, MOD A4C: 99.9 ml LV vol s, MOD A2C: 51.2 ml LV vol s, MOD A4C: 64.9 ml LV SV MOD A2C:     36.2 ml LV SV MOD A4C:     99.9 ml LV SV MOD BP:      36.6 ml RIGHT VENTRICLE RV S prime:     6.65 cm/s TAPSE (M-mode): 1.1 cm LEFT ATRIUM             Index        RIGHT ATRIUM           Index LA diam:        4.10 cm 3.11 cm/m   RA Area:     16.30 cm LA Vol (A2C):   91.9 ml 69.72 ml/m  RA Volume:   44.80 ml  33.99 ml/m LA Vol (A4C):   49.3 ml 37.40 ml/m LA Biplane Vol: 68.8 ml 52.20 ml/m  AORTIC VALVE                    PULMONIC VALVE AV Area (Vmax):    2.16 cm     PR End Diast Vel: 13.84 msec AV Area (Vmean):   2.32 cm AV Area (VTI):     2.29 cm AV Vmax:           154.00 cm/s AV Vmean:          84.200 cm/s AV VTI:            0.233 m AV Peak Grad:      9.5 mmHg AV Mean Grad:      4.0 mmHg LVOT Vmax:         106.00 cm/s LVOT Vmean:        62.300 cm/s LVOT VTI:          0.170 m LVOT/AV VTI ratio: 0.73 AI PHT:            422 msec  AORTA Ao Root diam: 2.70 cm MITRAL VALVE               TRICUSPID VALVE MV Area (PHT): 4.33 cm    TR Peak grad:   50.7 mmHg MV Decel Time: 175 msec    TR Vmax:        356.00 cm/s MV E velocity: 98.50 cm/s MV A velocity: 53.40 cm/s  SHUNTS MV E/A ratio:  1.84        Systemic VTI:  0.17 m                            Systemic Diam: 2.00 cm Epifanio Lesches MD Electronically signed by Epifanio Lesches MD Signature Date/Time: 12/24/2023/7:51:25 PM    Final     Assessment/Plan:   Active Problems:   Acute exacerbation of CHF (congestive heart failure) (HCC)   HFrEF (heart failure with reduced  ejection fraction) (HCC)   Hyponatremia   Patient Summary: Amy Howell is a 68 y.o. with a pertinent PMH of CVA, type 2 diabetes, hypertension, HFrEF,, who presented  with sudden onset of left foot swelling, and admitted for acute HF exacerbation.   Acute CHF exacerbation (LV EF 30-35%) Improving, slightly hypertensive, net - 1.9 L since hospitalized with 2 doses of IV Lasix 40 mg.  Breathing on room air, left leg swelling has improved.  Clinically euvolemic.  Echocardiogram 1/22 which showed LV EF 35-40% with global hypokinesis, left atrial size severely dilated ( unchanged as compared to prior), and new findings of  severely elevated pulmonary arterial pressure and moderate to severe mitral valve regurgitation.  Cardiology consulted, added torsemide 20 mg and a small dose of digoxin 0.125 mg for M/W/F for right ventricular dysfunction.  Will consider mitral valve and tricuspid valve intervention in future if patient and family is agreeable.  Plan: -Follow-up with cardiology recs and recommendations. -Cardiac monitoring -Torsemide to 20 mg - Digoxin 0.125 mg M/W/F - Strict I's & O's, daily weights - Follow-up with [facility] health failure team. 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  Baseline Scr 1.20-1.30  Plan:  - Trend BMP -Avoid nephrotoxic agents  HTN Slightly hypertensive, not reporting chest pain or palpitations.  EKG showed bradycardia.  Will continue to hold carvedilol in the setting of heart failure exacerbation, and bradycardia.  Will also send out renin/aldosterone to rule out secondary HTN, due to persistent hypokalemia  and uncontrolled HTN  Plan: - Follow up on plasma renin/aldosterone activity level - Hold carvedilol in setting of acute heart failure exacerbation   Hx CVA Plan: -Continue ASA 81 mg daily -Continue brilinta 90 mg PID   Type 2 diabetes mellitus Plan: -CBG TID with meals, SSI TID with  meals -CBG goal 140-180    Diet: Cardiac diet IVF:  None  VTE: rivaroxaban (XARELTO) tablet 10 mg Start: 12/24/23 1130 SCDs Start: 12/24/23 1038rivaroxaban (XARELTO) tablet 10 mg  Code: Full PT/OT: Pending ID:  Anti-infectives (From admission, onward)    None      Anticipated discharge to pending medical stabilization.  Laretta Bolster, MD 12/25/2023, 2:17 PM Pager: [phone] [facility]  Please contact the on call pager after 5 pm and on weekends at [phone].

## 2023-12-25 NOTE — Progress Notes (Signed)
Heart Failure Navigator Progress Note  Assessed for Heart & Vascular TOC clinic readiness.  Patient does not meet criteria due to has a scheduled [facility] appointment on 01/08/2024.  No TOC  Navigator will sign off at this time.    Rhae Hammock, BSN, Scientist, clinical (histocompatibility and immunogenetics) Only

## 2023-12-25 NOTE — Consult Note (Signed)
Referring Physician: Mercie Eon, MD/Emily August Saucer, DO  Amy Howell is an 68 y.o. female.                       Chief Complaint: Shortness of breath and leg edema  HPI: 68 years old balck female with past medical history of CVA, T2DM, HTN, HFrEF who presented for evaluation after sudden onset of her L foot swelling. She states that she has never had swelling like this before, and that the swelling extended up to her thigh. The RLE was swollen as well but to a much lesser degree. She also has had productive cough. She denies weight gain, stating she is actually trying to gain weight, and denies needing increased head elevation in order to sleep. She uses 7 pillows under her head at night and this has not changed recently. She denies large volume fluid intake due to dysphagia. She has taken all of her medications with the exception of spironolactone which she stopped 3 days ago  Her echocardiogram shows global hypokinesia, mild LVH, Dilated LA and RA, moderate to severe MR, moderate TR, mild to moderate AI, PI and mild pericardial effusion. She had good diuresis with lasix use.  She had RCA stent placed in 11/2022.  Past Medical History:  Diagnosis Date   Cataracts, bilateral    Cerebral infarction due to carotid artery stenosis (HCC)    Diabetes mellitus type 2 in nonobese (HCC)    Glaucoma    Hypertension    Hypertensive emergency 11/22/2022   Stroke [facility])    no residual, "series of mini strokes"   Tobacco abuse    Vertigo       Past Surgical History:  Procedure Laterality Date   ABDOMINAL HYSTERECTOMY     CARDIAC DEFIBRILLATOR PLACEMENT     CORONARY STENT INTERVENTION N/A 11/26/2022   Procedure: CORONARY STENT INTERVENTION;  Surgeon: Corky Crafts, MD;  Location: [facility];  Service: Cardiovascular;  Laterality: N/A;   gum operation     Implantable loop recorder removal  02/01/2019   MDT Reveal LINQ explantation in office by JA   IR 3D INDEPENDENT WKST  05/07/2017    IR 3D INDEPENDENT WKST  09/17/2023   IR ANGIO INTRA EXTRACRAN SEL COM CAROTID INNOMINATE BILAT MOD SED  04/23/2017   IR ANGIO INTRA EXTRACRAN SEL COM CAROTID INNOMINATE BILAT MOD SED  08/06/2017   IR ANGIO INTRA EXTRACRAN SEL COM CAROTID INNOMINATE UNI R MOD SED  05/07/2017   IR ANGIO INTRA EXTRACRAN SEL INTERNAL CAROTID BILAT MOD SED  09/17/2023   IR ANGIO VERTEBRAL SEL VERTEBRAL BILAT MOD SED  04/23/2017   IR ANGIO VERTEBRAL SEL VERTEBRAL BILAT MOD SED  08/06/2017   IR ANGIOGRAM FOLLOW UP STUDY  05/07/2017   IR ANGIOGRAM SELECTIVE EACH ADDITIONAL VESSEL  05/07/2017   IR RADIOLOGIST EVAL & MGMT  04/16/2017   IR RADIOLOGIST EVAL & MGMT  05/29/2017   IR RADIOLOGIST EVAL & MGMT  09/29/2023   IR RADIOLOGIST EVAL & MGMT  11/05/2023   IR TRANSCATH/EMBOLIZ  05/07/2017   LOOP RECORDER INSERTION N/A 03/28/2017   Procedure: Loop Recorder Insertion;  Surgeon: Hillis Range, MD;  [facility]: [facility];  Service: Cardiovascular;  Laterality: N/A;   MASTECTOMY W/ SENTINEL NODE BIOPSY Right 06/25/2022   Procedure: RIGHT MASTECTOMY WITH AXILLARY SENTINEL LYMPH NODE BIOPSY;  Surgeon: Manus Rudd, MD;  Location: [facility];  Service: General;  Laterality: Right;   RADIOLOGY WITH ANESTHESIA N/A  05/07/2017   Procedure: EMBOLIZATION;  Surgeon: Julieanne Cotton, MD;  Location: [facility];  Service: Radiology;  Laterality: N/A;   RADIOLOGY WITH ANESTHESIA N/A 08/06/2017   Procedure: RADIOLOGY WITH ANESTHESIA STENTING;  Surgeon: Julieanne Cotton, MD;  Location: [facility];  Service: Radiology;  Laterality: N/A;   RIGHT/LEFT HEART CATH AND CORONARY ANGIOGRAPHY N/A 11/26/2022   Procedure: RIGHT/LEFT HEART CATH AND CORONARY ANGIOGRAPHY;  Surgeon: Corky Crafts, MD;  Location: [facility] INVASIVE CV LAB;  Service: Cardiovascular;  Laterality: N/A;   TEE WITHOUT CARDIOVERSION N/A 03/28/2017   Procedure: TRANSESOPHAGEAL ECHOCARDIOGRAM (TEE);  Surgeon: Lewayne Bunting, MD;  Location: [facility]COPY;  Service: Cardiovascular;   Laterality: N/A;    Family History  Problem Relation Age of Onset   Dementia Mother    Kidney disease Father    Hypertension Father    Diabetes Father    Heart disease Father    Lung cancer Maternal Aunt        dx after 71   Prostate cancer Other        MGM's brother   Breast cancer Neg Hx    Colon cancer Neg Hx    Liver disease Neg Hx    Esophageal cancer Neg Hx    Social History:  reports that she has quit smoking. Her smoking use included cigarettes. She has a 28 pack-year smoking history. She has never used smokeless tobacco. She reports that she does not drink alcohol and does not use drugs.  Allergies:  Allergies  Allergen Reactions   Bidil [Isosorb Dinitrate-Hydralazine] Other (See Comments)    Caused a stroke   Chlorhexidine Dermatitis   Lisinopril Other (See Comments)    Angioedema   Pork-Derived Products Hives   Rosuvastatin Other (See Comments)    headache   Statins Other (See Comments)    Headaches and swelling    Medications Prior to Admission  Medication Sig Dispense Refill   aspirin 81 MG chewable tablet Chew 1 tablet (81 mg total) by mouth daily. 30 tablet 3   carvedilol (COREG) 12.5 MG tablet Take 1 tablet (12.5 mg total) by mouth 2 (two) times daily with a meal. 30 tablet 0   dorzolamide (TRUSOPT) 2 % ophthalmic solution Instill 1 drop into both eyes twice a day 30 mL 3   feeding supplement (ENSURE ENLIVE / ENSURE PLUS) LIQD Take 237 mLs by mouth 3 (three) times daily between meals. (Patient taking differently: Take 237 mLs by mouth daily.) 237 mL 12   latanoprost (XALATAN) 0.005 % ophthalmic solution Instill 1 drop into both eyes at bedtime 22.5 mL 3   ticagrelor (BRILINTA) 90 MG TABS tablet Take 1 tablet (90 mg total) by mouth 2 (two) times daily. 60 tablet 11   timolol (TIMOPTIC) 0.5 % ophthalmic solution Place 1 drop into both eyes every morning. 45 mL 3   atorvastatin (LIPITOR) 20 MG tablet Take 20 mg by mouth daily. (Patient not taking: Reported on  12/24/2023)     hydrochlorothiazide (HYDRODIURIL) 12.5 MG tablet Take 12.5 mg by mouth daily. (Patient not taking: Reported on 12/24/2023)     mirtazapine (REMERON SOL-TAB) 15 MG disintegrating tablet Take 7.5 mg by mouth at bedtime. (Patient not taking: Reported on 12/24/2023)     Multiple Vitamin (MULTIVITAMIN WITH MINERALS) TABS tablet Take 1 tablet by mouth daily. (Patient not taking: Reported on 12/24/2023) 30 tablet 0   polyethylene glycol (MIRALAX / GLYCOLAX) 17 g packet Take 17 g by mouth daily. (Patient not taking: Reported on 12/24/2023) 14  each 0   spironolactone (ALDACTONE) 25 MG tablet Take 1/2 tablet (12.5 mg total) by mouth daily. (Patient not taking: Reported on 12/24/2023) 30 tablet 11   thiamine (VITAMIN B-1) 100 MG tablet Take 1 tablet (100 mg total) by mouth daily. (Patient not taking: Reported on 12/24/2023) 30 tablet 0    Results for orders placed or performed during the hospital encounter of 12/23/23 (from the past 48 hours)  Basic metabolic panel     Status: Abnormal   Collection Time: 12/23/23  6:26 PM  Result Value Ref Range   Sodium 136 135 - 145 mmol/L   Potassium 4.5 3.5 - 5.1 mmol/L   Chloride 106 98 - 111 mmol/L   CO2 22 22 - 32 mmol/L   Glucose, Bld 128 (H) 70 - 99 mg/dL    Comment: Glucose reference range applies only to samples taken after fasting for at least 8 hours.   BUN 23 8 - 23 mg/dL   Creatinine, Ser 1.61 (H) 0.44 - 1.00 mg/dL   Calcium 8.7 (L) 8.9 - 10.3 mg/dL   GFR, Estimated 46 (L) >60 mL/min    Comment: (NOTE) Calculated using the CKD-EPI Creatinine Equation (2021)    Anion gap 8 5 - 15    Comment: Performed at [facility], [address] [place], [place] [address]  CBC     Status: Abnormal   Collection Time: 12/23/23  6:26 PM  Result Value Ref Range   WBC 4.0 4.0 - 10.5 K/uL   RBC 4.10 3.87 - 5.11 MIL/uL   Hemoglobin 11.8 (L) 12.0 - 15.0 g/dL   HCT 54.0 98.1 - 19.1 %   MCV 96.1 80.0 - 100.0 fL   MCH 28.8 26.0 - 34.0 pg    MCHC 29.9 (L) 30.0 - 36.0 g/dL   RDW 47.8 (H) 29.5 - 62.1 %   Platelets 156 150 - 400 K/uL   nRBC 0.0 0.0 - 0.2 %    Comment: Performed at [facility], [address] [place], [place] [address]  Brain natriuretic peptide     Status: Abnormal   Collection Time: 12/23/23  7:30 PM  Result Value Ref Range   B Natriuretic Peptide 2,807.1 (H) 0.0 - 100.0 pg/mL    Comment: Performed at [facility], [address] [place], [place] [address]  CBG monitoring, ED     Status: Abnormal   Collection Time: 12/24/23  7:57 AM  Result Value Ref Range   Glucose-Capillary 131 (H) 70 - 99 mg/dL    Comment: Glucose reference range applies only to samples taken after fasting for at least 8 hours.  MRSA Next Gen by PCR, Nasal     Status: Abnormal   Collection Time: 12/24/23 10:51 AM   Specimen: Nasal Mucosa; Nasal Swab  Result Value Ref Range   MRSA by PCR Next Gen NEGATIVE (A) NOT DETECTED    Comment: Performed at [facility], [address], [place], [place] [address]  HIV Antibody (routine testing w rflx)     Status: None   Collection Time: 12/24/23 11:22 AM  Result Value Ref Range   HIV Screen 4th Generation wRfx Non Reactive Non Reactive    Comment: Performed at [facility], [address], [place], [place] [address]  Glucose, capillary     Status: None   Collection Time: 12/24/23  4:18 PM  Result Value Ref Range   Glucose-Capillary 93 70 - 99 mg/dL    Comment: Glucose reference  range applies only to samples taken after fasting for at least 8 hours.  Magnesium     Status: Abnormal   Collection Time: 12/25/23  2:22 AM  Result Value Ref Range   Magnesium 1.6 (L) 1.7 - 2.4 mg/dL    Comment: Performed at [facility], [address] [place], [place] [address]  Basic metabolic panel     Status: Abnormal   Collection Time: 12/25/23  2:23 AM  Result Value Ref Range   Sodium 130 (L) 135 - 145 mmol/L   Potassium 3.4 (L) 3.5 - 5.1 mmol/L    Chloride 96 (L) 98 - 111 mmol/L   CO2 24 22 - 32 mmol/L   Glucose, Bld 96 70 - 99 mg/dL    Comment: Glucose reference range applies only to samples taken after fasting for at least 8 hours.   BUN 19 8 - 23 mg/dL   Creatinine, Ser 6.04 (H) 0.44 - 1.00 mg/dL   Calcium 8.1 (L) 8.9 - 10.3 mg/dL   GFR, Estimated 41 (L) >60 mL/min    Comment: (NOTE) Calculated using the CKD-EPI Creatinine Equation (2021)    Anion gap 10 5 - 15    Comment: Performed at [facility], [address] [place], [place] [address]  CBC     Status: Abnormal   Collection Time: 12/25/23  2:23 AM  Result Value Ref Range   WBC 5.6 4.0 - 10.5 K/uL   RBC 3.68 (L) 3.87 - 5.11 MIL/uL   Hemoglobin 10.6 (L) 12.0 - 15.0 g/dL   HCT 11.9 (L) 14.7 - 82.9 %   MCV 91.8 80.0 - 100.0 fL   MCH 28.8 26.0 - 34.0 pg   MCHC 31.4 30.0 - 36.0 g/dL   RDW 56.2 (H) 13.0 - 86.5 %   Platelets 179 150 - 400 K/uL   nRBC 0.0 0.0 - 0.2 %    Comment: Performed at [facility], [address] [place], [place] [address]   ECHOCARDIOGRAM COMPLETE Result Date: 12/24/2023    ECHOCARDIOGRAM REPORT   Patient Name:   KYMIAH DAUBERMAN Date of Exam: 12/24/2023 Medical Rec #:  [id number]     Height:       60.0 in Accession #:    [id number]    Weight:       88.2 lb Date of Birth:  [birth year]      BSA:          1.318 m Patient Age:    68 years      BP:           158/76 mmHg Patient Gender: F             HR:           80 bpm. Exam Location:  Inpatient Procedure: 2D Echo, Cardiac Doppler and Color Doppler Indications:    CHF  History:        Patient has prior history of Echocardiogram examinations, most                 recent 10/02/2023. CHF; Risk Factors:Diabetes, Hypertension and                 Dyslipidemia.  Sonographer:    Melton Krebs RDCS, FE, PE Referring Phys: [id number] JULIE MACHEN IMPRESSIONS  1. Left ventricular ejection fraction, by estimation, is 35 to 40%. The left ventricle has moderately decreased function. The left ventricle demonstrates  global hypokinesis. There is mild left ventricular hypertrophy. Left  ventricular diastolic parameters are consistent with Grade II diastolic dysfunction (pseudonormalization). Elevated left atrial pressure.  2. Right ventricular systolic function is mildly reduced. The right ventricular size is normal. There is severely elevated pulmonary artery systolic pressure. The estimated right ventricular systolic pressure is 65.7 mmHg.  3. Left atrial size was severely dilated.  4. Right atrial size was mildly dilated.  5. Moderate pericardial effusion. Meaures 1.8cm adjacent to RV. There is no evidence of cardiac tamponade. Fixed/dilated IVC but no RA/RV collapse seen  6. The mitral valve is abnormal. Moderate to severe mitral valve regurgitation.  7. Tricuspid valve regurgitation is moderate.  8. The aortic valve is tricuspid. Aortic valve regurgitation is moderate. No aortic stenosis is present.  9. The inferior vena cava is dilated in size with <50% respiratory variability, suggesting right atrial pressure of 15 mmHg. FINDINGS  Left Ventricle: Left ventricular ejection fraction, by estimation, is 35 to 40%. The left ventricle has moderately decreased function. The left ventricle demonstrates global hypokinesis. The left ventricular internal cavity size was normal in size. There is mild left ventricular hypertrophy. Left ventricular diastolic parameters are consistent with Grade II diastolic dysfunction (pseudonormalization). Elevated left atrial pressure. Right Ventricle: The right ventricular size is normal. No increase in right ventricular wall thickness. Right ventricular systolic function is mildly reduced. There is severely elevated pulmonary artery systolic pressure. The tricuspid regurgitant velocity is 3.56 m/s, and with an assumed right atrial pressure of 15 mmHg, the estimated right ventricular systolic pressure is 65.7 mmHg. Left Atrium: Left atrial size was severely dilated. Right Atrium: Right atrial size  was mildly dilated. Pericardium: A moderately sized pericardial effusion is present. There is no evidence of cardiac tamponade. Mitral Valve: The mitral valve is abnormal. Moderate to severe mitral valve regurgitation. Tricuspid Valve: The tricuspid valve is normal in structure. Tricuspid valve regurgitation is moderate. Aortic Valve: The aortic valve is tricuspid. Aortic valve regurgitation is moderate. Aortic regurgitation PHT measures 422 msec. No aortic stenosis is present. Aortic valve mean gradient measures 4.0 mmHg. Aortic valve peak gradient measures 9.5 mmHg. Aortic valve area, by VTI measures 2.29 cm. Pulmonic Valve: The pulmonic valve was not well visualized. Pulmonic valve regurgitation is trivial. Aorta: The aortic root is normal in size and structure. Venous: The inferior vena cava is dilated in size with less than 50% respiratory variability, suggesting right atrial pressure of 15 mmHg. IAS/Shunts: The interatrial septum was not well visualized.  LEFT VENTRICLE PLAX 2D LVIDd:         4.70 cm     Diastology LVIDs:         4.10 cm     LV e' medial:    2.76 cm/s LV PW:         1.30 cm     LV E/e' medial:  35.7 LV IVS:        1.10 cm     LV e' lateral:   4.05 cm/s LVOT diam:     2.00 cm     LV E/e' lateral: 24.3 LV SV:         53 LV SV Index:   41 LVOT Area:     3.14 cm  LV Volumes (MOD) LV vol d, MOD A2C: 87.4 ml LV vol d, MOD A4C: 99.9 ml LV vol s, MOD A2C: 51.2 ml LV vol s, MOD A4C: 64.9 ml LV SV MOD A2C:     36.2 ml LV SV MOD A4C:     99.9 ml LV SV  MOD BP:      36.6 ml RIGHT VENTRICLE RV S prime:     6.65 cm/s TAPSE (M-mode): 1.1 cm LEFT ATRIUM             Index        RIGHT ATRIUM           Index LA diam:        4.10 cm 3.11 cm/m   RA Area:     16.30 cm LA Vol (A2C):   91.9 ml 69.72 ml/m  RA Volume:   44.80 ml  33.99 ml/m LA Vol (A4C):   49.3 ml 37.40 ml/m LA Biplane Vol: 68.8 ml 52.20 ml/m  AORTIC VALVE                    PULMONIC VALVE AV Area (Vmax):    2.16 cm     PR End Diast Vel: 13.84  msec AV Area (Vmean):   2.32 cm AV Area (VTI):     2.29 cm AV Vmax:           154.00 cm/s AV Vmean:          84.200 cm/s AV VTI:            0.233 m AV Peak Grad:      9.5 mmHg AV Mean Grad:      4.0 mmHg LVOT Vmax:         106.00 cm/s LVOT Vmean:        62.300 cm/s LVOT VTI:          0.170 m LVOT/AV VTI ratio: 0.73 AI PHT:            422 msec  AORTA Ao Root diam: 2.70 cm MITRAL VALVE               TRICUSPID VALVE MV Area (PHT): 4.33 cm    TR Peak grad:   50.7 mmHg MV Decel Time: 175 msec    TR Vmax:        356.00 cm/s MV E velocity: 98.50 cm/s MV A velocity: 53.40 cm/s  SHUNTS MV E/A ratio:  1.84        Systemic VTI:  0.17 m                            Systemic Diam: 2.00 cm Epifanio Lesches MD Electronically signed by Epifanio Lesches MD Signature Date/Time: 12/24/2023/7:51:25 PM    Final    DG Chest 2 View Result Date: 12/23/2023 CLINICAL DATA:  Shortness of breath EXAM: CHEST - 2 VIEW COMPARISON:  11/30/2023 FINDINGS: Cardiomegaly. Mediastinal contours within normal limits. Aortic atherosclerosis. Diffuse pulmonary interstitial prominence again noted, similar to prior study. Bibasilar opacities, favor atelectasis. Trace bilateral effusions suspected. No acute bony abnormality. IMPRESSION: Cardiomegaly. Stable diffuse pulmonary interstitial prominence which could reflect edema, atypical infection, or chronic lung disease. Small bilateral effusions with bibasilar opacities, favor atelectasis. Electronically Signed   By: Charlett Nose M.D.   On: 12/23/2023 19:16    Review Of Systems Constitutional: No fever, chills, Chronic weight loss.  Eyes: No vision change, wears glasses. No discharge or pain. Ears: No hearing loss, No tinnitus. Respiratory: No asthma, COPD, pneumonias. Positive shortness of breath. No hemoptysis. Cardiovascular: No chest pain, palpitation, positive leg edema. Gastrointestinal: No nausea, vomiting, diarrhea, constipation. No GI bleed. No hepatitis. Genitourinary: No dysuria,  hematuria, kidney stone. No incontinance. Neurological: No headache, stroke, seizures.  Psychiatry: No  psych facility admission for anxiety, depression, suicide. No detox. Skin: No rash. Musculoskeletal: Positive joint pain, fibromyalgia. No neck pain, back pain. Lymphadenopathy: No lymphadenopathy. Hematology: Mild anemia, No easy bruising.   Blood pressure (!) 141/60, pulse 80, temperature 98.2 F (36.8 C), temperature source Oral, resp. rate 16, height 5' (1.524 m), weight 38.8 kg, SpO2 95%. Body mass index is 16.7 kg/m. General appearance: alert, cooperative, appears stated age and mild respiratory distress Head: Normocephalic, atraumatic. Eyes: Brown eyes, pink conjunctiva, corneas clear. PERRL, EOM's intact. Neck: No adenopathy, no carotid bruit, no JVD at 45 degree angle, supple, symmetrical, trachea midline and thyroid not enlarged. Resp: Clearing to auscultation bilaterally. Cardio: Irregular rate and rhythm, S1, S2 normal, III/VI systolic murmur, no click, rub or gallop GI: Soft, non-tender; bowel sounds normal; no organomegaly. Extremities: Trace edema, no cyanosis or clubbing. Skin: Warm and dry.  Neurologic: Alert and oriented X 3, normal strength. Normal coordination.  Assessment/Plan Acute on chronic systolic left heart failure, HFrEF CKD, IIIa HTN Type 2 DM Moderate to severe MR Moderate TR Mild AI and PI CAD S/P RCA stent S/P cardiac defibrillator  Plan: Patient wants to try medical treatment for now. May try Torsemide 20 mg. Daily from tomorrow. Will add very small dose of digoxin like 0.125 mg on M-W-F only for RV dysfunction. Patient and daughter understood salt and fluid restriction with daily weight. MV and TV interventions in future if patient and family is agreeable. F/U with [facility] heart failure team. Patient to see me as needed.  Time spent: Review of old records, Lab, x-rays, EKG, other cardiac tests, examination, discussion with  patient/Doctor/Nurse/Daughter over 70 minutes.  Ricki Rodriguez, MD  12/25/2023, 2:29 PM

## 2023-12-25 NOTE — TOC Initial Note (Signed)
Transition of Care [facility]) - Initial/Assessment Note    Patient Details  Name: Amy Howell MRN: [id number] Date of Birth: May 12, 1956  [facility] CM/SW Contact:    Harriet Masson, RN Phone Number: 12/25/2023, 3:28 PM  Clinical Narrative:                 Spoke to patient regarding transition needs. Patient lives with daughter and has a rollator. PCP confirmed, No TOC needs at this time.  Expected Discharge Plan: Home/Self Care Barriers to Discharge: Continued Medical Work up   Patient Goals and CMS Choice Patient states their goals for this hospitalization and ongoing recovery are:: return home          Expected Discharge Plan and Services       Living arrangements for the past 2 months: Apartment                                      Prior Living Arrangements/Services Living arrangements for the past 2 months: Apartment Lives with:: Adult Children Patient language and need for interpreter reviewed:: Yes Do you feel safe going back to the place where you live?: Yes      Need for Family Participation in Patient Care: Yes (Comment) Care giver support system in place?: Yes (comment) Current home services: DME (rollator) Criminal Activity/Legal Involvement Pertinent to Current Situation/Hospitalization: No - Comment as needed  Activities of Daily Living   ADL Screening (condition at time of admission) Independently performs ADLs?: Yes (appropriate for developmental age) Is the patient deaf or have difficulty hearing?: No Does the patient have difficulty seeing, even when wearing glasses/contacts?: No Does the patient have difficulty concentrating, remembering, or making decisions?: No  Permission Sought/Granted                  Emotional Assessment Appearance:: Appears stated age Attitude/Demeanor/Rapport: Gracious Affect (typically observed): Accepting Orientation: : Oriented to Self, Oriented to Place, Oriented to  Time, Oriented  to Situation Alcohol / Substance Use: Not Applicable Psych Involvement: No (comment)  Admission diagnosis:  Acute exacerbation of CHF (congestive heart failure) (HCC) [I50.9] Congestive heart failure, unspecified HF chronicity, unspecified heart failure type (HCC) [I50.9] Patient Active Problem List   Diagnosis Date Noted   HFrEF (heart failure with reduced ejection fraction) (HCC) 12/25/2023   Hyponatremia 12/25/2023   Acute exacerbation of CHF (congestive heart failure) (HCC) 12/23/2023   Chronic anemia 12/04/2023   Dysphagia 12/01/2023   Antiplatelet or antithrombotic long-term use 12/01/2023   Constipation 11/21/2023   Medication nonadherence due to intolerance 11/21/2023   Pericardial effusion 09/17/2023   Ischemic cardiomyopathy 09/17/2023   History of stroke 09/15/2023   Severe protein-calorie malnutrition (HCC) 09/13/2023   Underweight 09/13/2023   Pleural effusion 09/13/2023   Lung nodule 09/12/2023   Elevated serum creatinine 09/12/2023   Healthcare maintenance 05/08/2023   Hyperlipidemia 04/09/2023   Chronic heart failure with reduced ejection fraction (HFrEF, <= 40%) (HCC) 11/22/2022   Genetic testing 07/02/2022   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 [place] [address] Phone: [phone] Fax: [phone]  [facility] [address] [place] [address] Phone: [phone] Fax: [phone]     Social Drivers of Health (SDOH) Social History: SDOH Screenings   Food Insecurity: No  Food Insecurity (12/24/2023)  Housing: Low Risk  (12/24/2023)  Transportation Needs: No Transportation Needs (12/24/2023)  Utilities: Not At Risk (12/24/2023)  Alcohol Screen: Low Risk  (09/17/2023)  Depression (PHQ2-9): Low Risk  (10/01/2023)  Financial Resource Strain: Low Risk  (09/17/2023)  Physical Activity: Insufficiently Active (04/08/2023)  Social Connections: Socially Isolated (12/24/2023)  Stress: No Stress Concern Present (04/08/2023)  Tobacco Use: Medium Risk (12/24/2023)   SDOH Interventions:     Readmission Risk Interventions     No data to display

## 2023-12-26 ENCOUNTER — Other Ambulatory Visit (HOSPITAL_COMMUNITY): Payer: Self-pay

## 2023-12-26 DIAGNOSIS — I5021 Acute systolic (congestive) heart failure: Secondary | ICD-10-CM | POA: Diagnosis not present

## 2023-12-26 LAB — CBC
HCT: 31.4 % — ABNORMAL LOW (ref 36.0–46.0)
Hemoglobin: 10 g/dL — ABNORMAL LOW (ref 12.0–15.0)
MCH: 29.3 pg (ref 26.0–34.0)
MCHC: 31.8 g/dL (ref 30.0–36.0)
MCV: 92.1 fL (ref 80.0–100.0)
Platelets: 169 10*3/uL (ref 150–400)
RBC: 3.41 MIL/uL — ABNORMAL LOW (ref 3.87–5.11)
RDW: 16 % — ABNORMAL HIGH (ref 11.5–15.5)
WBC: 5 10*3/uL (ref 4.0–10.5)
nRBC: 0 % (ref 0.0–0.2)

## 2023-12-26 LAB — BASIC METABOLIC PANEL
Anion gap: 8 (ref 5–15)
BUN: 24 mg/dL — ABNORMAL HIGH (ref 8–23)
CO2: 23 mmol/L (ref 22–32)
Calcium: 8.5 mg/dL — ABNORMAL LOW (ref 8.9–10.3)
Chloride: 106 mmol/L (ref 98–111)
Creatinine, Ser: 1.18 mg/dL — ABNORMAL HIGH (ref 0.44–1.00)
GFR, Estimated: 50 mL/min — ABNORMAL LOW (ref >60)
Glucose, Bld: 99 mg/dL (ref 70–99)
Potassium: 4.7 mmol/L (ref 3.5–5.1)
Sodium: 137 mmol/L (ref 135–145)

## 2023-12-26 LAB — MAGNESIUM: Magnesium: 1.9 mg/dL (ref 1.7–2.4)

## 2023-12-26 MED ORDER — DIGOXIN 125 MCG PO TABS
0.1250 mg | ORAL_TABLET | ORAL | 0 refills | Status: DC
  Filled 2023-12-26: qty 12, 28d supply, fill #0

## 2023-12-26 MED ORDER — CARVEDILOL 12.5 MG PO TABS
12.5000 mg | ORAL_TABLET | Freq: Two times a day (BID) | ORAL | Status: DC
  Administered 2023-12-26: 12.5 mg via ORAL
  Filled 2023-12-26: qty 1

## 2023-12-26 MED ORDER — TORSEMIDE 20 MG PO TABS
20.0000 mg | ORAL_TABLET | Freq: Every day | ORAL | 0 refills | Status: DC
  Filled 2023-12-26: qty 30, 30d supply, fill #0

## 2023-12-26 NOTE — Discharge Summary (Addendum)
Name: Amy Howell MRN: [id number] DOB: Jan 19, 1956 68 y.o. PCP: Rana Snare, DO  Date of Admission: 12/23/2023  7:14 PM Date of Discharge: 12/26/2023 Attending Physician: Dr. Lafonda Mosses  Discharge Diagnosis: Active Problems:   Acute exacerbation of CHF (congestive heart failure) (HCC)   HFrEF (heart failure with reduced ejection fraction) (HCC)   Hyponatremia   Acute heart failure with reduced ejection fraction (HFrEF, <= 40%) (HCC)    Discharge Medications: Allergies as of 12/26/2023       Reactions   Bidil [isosorb Dinitrate-hydralazine] Other (See Comments)   Caused a stroke   Chlorhexidine Dermatitis   Lisinopril Other (See Comments)   Angioedema   Pork-derived Products Hives   Rosuvastatin Other (See Comments)   headache   Statins Other (See Comments)   Headaches and swelling        Medication List     PAUSE taking these medications    hydrochlorothiazide 12.5 MG tablet Wait to take this until your doctor or other care provider tells you to start again. Commonly known as: HYDRODIURIL Take 12.5 mg by mouth daily.   spironolactone 25 MG tablet Wait to take this until your doctor or other care provider tells you to start again. Commonly known as: Aldactone Take 1/2 tablet (12.5 mg total) by mouth daily.       STOP taking these medications    polyethylene glycol 17 g packet Commonly known as: MIRALAX / GLYCOLAX   thiamine 100 MG tablet Commonly known as: Vitamin B-1       TAKE these medications    aspirin 81 MG chewable tablet Chew 1 tablet (81 mg total) by mouth daily.   atorvastatin 20 MG tablet Commonly known as: LIPITOR Take 20 mg by mouth daily.   Brilinta 90 MG Tabs tablet Generic drug: ticagrelor Take 1 tablet (90 mg total) by mouth 2 (two) times daily.   carvedilol 12.5 MG tablet Commonly known as: COREG Take 1 tablet (12.5 mg total) by mouth 2 (two) times daily with a meal.   digoxin 0.125 MG tablet Commonly known as:  LANOXIN Take 1 tablet (0.125 mg total) by mouth every Monday, Wednesday, and Friday. Start taking on: December 29, 2023   dorzolamide 2 % ophthalmic solution Commonly known as: TRUSOPT Instill 1 drop into both eyes twice a day   feeding supplement Liqd Take 237 mLs by mouth 3 (three) times daily between meals. What changed: when to take this   latanoprost 0.005 % ophthalmic solution Commonly known as: XALATAN Instill 1 drop into both eyes at bedtime   mirtazapine 15 MG disintegrating tablet Commonly known as: REMERON SOL-TAB Take 7.5 mg by mouth at bedtime.   multivitamin with minerals Tabs tablet Take 1 tablet by mouth daily.   timolol 0.5 % ophthalmic solution Commonly known as: TIMOPTIC Place 1 drop into both eyes every morning.   torsemide 20 MG tablet Commonly known as: DEMADEX Take 1 tablet (20 mg total) by mouth daily. Start taking on: December 27, 2023        Disposition and follow-up:   [name] was discharged from [facility] in [place] condition.  At the hospital follow up visit please address:  1.  Follow-up:  a.  Acute CHF exacerbation. Euvolemic on discharge. Weight on discharge 39.3 kg - Please confirm patient is taking torsemide 20 mg and digoxin 0.125 mcg Monday/Wednesday/Friday. - Please assess volume status.   b.  Hypertension Slightly hypertensive prior to discharge, continued with carvedilol 12.5 mg.  Not taking spironolactone, although she is supposed to be taking it. - Please follow-up on renin/Aldo  c. CKD 3a Scr stable at 1.20.   2.  Labs / imaging needed at time of follow-up: CBC,BMP  3.  Pending labs/ test needing follow-up: None  4.  Medication Changes Abx -   End Date:  Follow-up Appointments:  Follow-up Information     [name], MD Follow up in 1 month(s).   Specialty: Cardiology Why: As needed Contact information: [address] [place] [address] [phone]                  Hospital Course by problem list: Amy Howell is a 68 y.o. with a pertinent PMH of CVA, type 2 diabetes, hypertension, HFrEF,, who presented with sudden onset of left foot swelling, and admitted for acute HF exacerbation.   Acute CHF exacerbation (LV EF 30-35%) On presentation, she had left leg >right swelling and productive cough, BNP> 2800, Satting on room air.  On exam, had bibasilar crackles, trace pitting edema left> right. She was diuresed with IV Lasix, with good urinary output.  Echocardiogram showed LVEF 35-40% with global hypokinesis, left atrial size severely dilated ( unchanged as compared to prior), and new findings of severely elevated pulmonary arterial pressure and moderate to severe mitral valve regurgitation. She was seen by cardiology, recommending torsemide 20 mg, and low-dose digoxin M/W/F for RV dysfunction.  Not considering surgical valve replacement, as patient would not be agreeable to it.  Patient with a chronic history of noncompliance, I query if patient would even take the torsemide 20 mg.   [facility] follow up - Continue torsemide 20 mg - Digoxin 0.125 mg M/W/F.  HTN Held carvedilol due to acute CHF exacerbation.  Restarted on the day of discharge, overnight EKG without bradycardia. Will follow-up on renin/Aldo, due to concern for secondary hypertension.  Patient should be on spironolactone 25 mg, however is noncompliant with it.  - Continue carvedilol 12.5 mg - Cw spirolactone 25 mg. - Follow-up on renin/Aldo.  CKD stage 3a SCr mildly elevated improved, 1.39>>1.18.  Encouraged good oral hydration.  Hx CVA Continued with aspirin 81 mg and Brilinta 90 mg twice daily    Discharge Subjective: Patient evaluated at bedside this AM.  Discharge Exam:   BP (!) 150/72 (BP Location: Left Arm)   Pulse 66   Temp 97.6 F (36.4 C) (Oral)   Resp 17   Ht 5' (1.524 m)   Wt 39.3 kg   SpO2 97%   BMI 16.92 kg/m   Constitutional: ill appearing, cachetic,  NAD. Cardiovascular: regular rate and rhythm, no m/r/g Pulmonary/Chest: normal work of breathing on room air, lungs clear to auscultation bilaterally Abdominal: soft, non-tender, non-distended MSK: normal bulk and tone Skin: warm and dry, no peripheral edema.  Pertinent Labs, Studies, and Procedures:     Latest Ref Rng & Units 12/26/2023    3:17 AM 12/25/2023    2:23 AM 12/23/2023    6:26 PM  CBC  WBC 4.0 - 10.5 K/uL 5.0  5.6  4.0   Hemoglobin 12.0 - 15.0 g/dL 21.3  08.6  57.8   Hematocrit 36.0 - 46.0 % 31.4  33.8  39.4   Platelets 150 - 400 K/uL 169  179  156        Latest Ref Rng & Units 12/26/2023    3:17 AM 12/25/2023    2:23 AM 12/23/2023    6:26 PM  CMP  Glucose 70 - 99  mg/dL 99  96  829   BUN 8 - 23 mg/dL 24  19  23    Creatinine 0.44 - 1.00 mg/dL 5.62  1.30  8.65   Sodium 135 - 145 mmol/L 137  130  136   Potassium 3.5 - 5.1 mmol/L 4.7  3.4  4.5   Chloride 98 - 111 mmol/L 106  96  106   CO2 22 - 32 mmol/L 23  24  22    Calcium 8.9 - 10.3 mg/dL 8.5  8.1  8.7     ECHOCARDIOGRAM COMPLETE Result Date: 12/24/2023    ECHOCARDIOGRAM REPORT   Patient Name:   JEZEBEL POLLET Date of Exam: 12/24/2023 Medical Rec #:  [id number]     Height:       60.0 in Accession #:    [id number]    Weight:       88.2 lb Date of Birth:  [birth year]      BSA:          1.318 m Patient Age:    68 years      BP:           158/76 mmHg Patient Gender: F             HR:           80 bpm. Exam Location:  Inpatient Procedure: 2D Echo, Cardiac Doppler and Color Doppler Indications:    CHF  History:        Patient has prior history of Echocardiogram examinations, most                 recent 10/02/2023. CHF; Risk Factors:Diabetes, Hypertension and                 Dyslipidemia.  Sonographer:    Melton Krebs RDCS, FE, PE Referring Phys: [id number] JULIE MACHEN IMPRESSIONS  1. Left ventricular ejection fraction, by estimation, is 35 to 40%. The left ventricle has moderately decreased function. The left ventricle demonstrates  global hypokinesis. There is mild left ventricular hypertrophy. Left ventricular diastolic parameters are consistent with Grade II diastolic dysfunction (pseudonormalization). Elevated left atrial pressure.  2. Right ventricular systolic function is mildly reduced. The right ventricular size is normal. There is severely elevated pulmonary artery systolic pressure. The estimated right ventricular systolic pressure is 65.7 mmHg.  3. Left atrial size was severely dilated.  4. Right atrial size was mildly dilated.  5. Moderate pericardial effusion. Meaures 1.8cm adjacent to RV. There is no evidence of cardiac tamponade. Fixed/dilated IVC but no RA/RV collapse seen  6. The mitral valve is abnormal. Moderate to severe mitral valve regurgitation.  7. Tricuspid valve regurgitation is moderate.  8. The aortic valve is tricuspid. Aortic valve regurgitation is moderate. No aortic stenosis is present.  9. The inferior vena cava is dilated in size with <50% respiratory variability, suggesting right atrial pressure of 15 mmHg. FINDINGS  Left Ventricle: Left ventricular ejection fraction, by estimation, is 35 to 40%. The left ventricle has moderately decreased function. The left ventricle demonstrates global hypokinesis. The left ventricular internal cavity size was normal in size. There is mild left ventricular hypertrophy. Left ventricular diastolic parameters are consistent with Grade II diastolic dysfunction (pseudonormalization). Elevated left atrial pressure. Right Ventricle: The right ventricular size is normal. No increase in right ventricular wall thickness. Right ventricular systolic function is mildly reduced. There is severely elevated pulmonary artery systolic pressure. The tricuspid regurgitant velocity is 3.56 m/s, and with an assumed right atrial pressure of 15  mmHg, the estimated right ventricular systolic pressure is 65.7 mmHg. Left Atrium: Left atrial size was severely dilated. Right Atrium: Right atrial size  was mildly dilated. Pericardium: A moderately sized pericardial effusion is present. There is no evidence of cardiac tamponade. Mitral Valve: The mitral valve is abnormal. Moderate to severe mitral valve regurgitation. Tricuspid Valve: The tricuspid valve is normal in structure. Tricuspid valve regurgitation is moderate. Aortic Valve: The aortic valve is tricuspid. Aortic valve regurgitation is moderate. Aortic regurgitation PHT measures 422 msec. No aortic stenosis is present. Aortic valve mean gradient measures 4.0 mmHg. Aortic valve peak gradient measures 9.5 mmHg. Aortic valve area, by VTI measures 2.29 cm. Pulmonic Valve: The pulmonic valve was not well visualized. Pulmonic valve regurgitation is trivial. Aorta: The aortic root is normal in size and structure. Venous: The inferior vena cava is dilated in size with less than 50% respiratory variability, suggesting right atrial pressure of 15 mmHg. IAS/Shunts: The interatrial septum was not well visualized.  LEFT VENTRICLE PLAX 2D LVIDd:         4.70 cm     Diastology LVIDs:         4.10 cm     LV e' medial:    2.76 cm/s LV PW:         1.30 cm     LV E/e' medial:  35.7 LV IVS:        1.10 cm     LV e' lateral:   4.05 cm/s LVOT diam:     2.00 cm     LV E/e' lateral: 24.3 LV SV:         53 LV SV Index:   41 LVOT Area:     3.14 cm  LV Volumes (MOD) LV vol d, MOD A2C: 87.4 ml LV vol d, MOD A4C: 99.9 ml LV vol s, MOD A2C: 51.2 ml LV vol s, MOD A4C: 64.9 ml LV SV MOD A2C:     36.2 ml LV SV MOD A4C:     99.9 ml LV SV MOD BP:      36.6 ml RIGHT VENTRICLE RV S prime:     6.65 cm/s TAPSE (M-mode): 1.1 cm LEFT ATRIUM             Index        RIGHT ATRIUM           Index LA diam:        4.10 cm 3.11 cm/m   RA Area:     16.30 cm LA Vol (A2C):   91.9 ml 69.72 ml/m  RA Volume:   44.80 ml  33.99 ml/m LA Vol (A4C):   49.3 ml 37.40 ml/m LA Biplane Vol: 68.8 ml 52.20 ml/m  AORTIC VALVE                    PULMONIC VALVE AV Area (Vmax):    2.16 cm     PR End Diast Vel: 13.84  msec AV Area (Vmean):   2.32 cm AV Area (VTI):     2.29 cm AV Vmax:           154.00 cm/s AV Vmean:          84.200 cm/s AV VTI:            0.233 m AV Peak Grad:      9.5 mmHg AV Mean Grad:      4.0 mmHg LVOT Vmax:         106.00 cm/s  LVOT Vmean:        62.300 cm/s LVOT VTI:          0.170 m LVOT/AV VTI ratio: 0.73 AI PHT:            422 msec  AORTA Ao Root diam: 2.70 cm MITRAL VALVE               TRICUSPID VALVE MV Area (PHT): 4.33 cm    TR Peak grad:   50.7 mmHg MV Decel Time: 175 msec    TR Vmax:        356.00 cm/s MV E velocity: 98.50 cm/s MV A velocity: 53.40 cm/s  SHUNTS MV E/A ratio:  1.84        Systemic VTI:  0.17 m                            Systemic Diam: 2.00 cm Epifanio Lesches MD Electronically signed by Epifanio Lesches MD Signature Date/Time: 12/24/2023/7:51:25 PM    Final    DG Chest 2 View Result Date: 12/23/2023 CLINICAL DATA:  Shortness of breath EXAM: CHEST - 2 VIEW COMPARISON:  11/30/2023 FINDINGS: Cardiomegaly. Mediastinal contours within normal limits. Aortic atherosclerosis. Diffuse pulmonary interstitial prominence again noted, similar to prior study. Bibasilar opacities, favor atelectasis. Trace bilateral effusions suspected. No acute bony abnormality. IMPRESSION: Cardiomegaly. Stable diffuse pulmonary interstitial prominence which could reflect edema, atypical infection, or chronic lung disease. Small bilateral effusions with bibasilar opacities, favor atelectasis. Electronically Signed   By: Charlett Nose M.D.   On: 12/23/2023 19:16     Discharge Instructions: Discharge Instructions     (HEART FAILURE PATIENTS) Call MD:  Anytime you have any of the following symptoms: 1) 3 pound weight gain in 24 hours or 5 pounds in 1 week 2) shortness of breath, with or without a dry hacking cough 3) swelling in the hands, feet or stomach 4) if you have to sleep on extra pillows at night in order to breathe.   Complete by: As directed    Diet - low sodium heart healthy   Complete  by: As directed    Discharge instructions   Complete by: As directed    - Follow up with PCP.   Increase activity slowly   Complete by: As directed        Signed: Laretta Bolster, MD 12/26/2023, 2:54 PM   Pager: [phone]

## 2023-12-26 NOTE — Discharge Instructions (Signed)
FOLLOW-UP INSTRUCTIONS:  Thank you for allowing [place] to be part of your care. You were hospitalized due to swelling in left lower leg. You were treated with lasix.   Please follow up with the following providers: A. Dr. Allena Katz, 01/09/2024 @10 :15 am    Please note these changes made to your medications:   A. Medications to continue: Current Meds  Medication Sig   aspirin 81 MG chewable tablet Chew 1 tablet (81 mg total) by mouth daily.   carvedilol (COREG) 12.5 MG tablet Take 1 tablet (12.5 mg total) by mouth 2 (two) times daily with a meal.   dorzolamide (TRUSOPT) 2 % ophthalmic solution Instill 1 drop into both eyes twice a day   feeding supplement (ENSURE ENLIVE / ENSURE PLUS) LIQD Take 237 mLs by mouth 3 (three) times daily between meals. (Patient taking differently: Take 237 mLs by mouth daily.)   latanoprost (XALATAN) 0.005 % ophthalmic solution Instill 1 drop into both eyes at bedtime   ticagrelor (BRILINTA) 90 MG TABS tablet Take 1 tablet (90 mg total) by mouth 2 (two) times daily.   timolol (TIMOPTIC) 0.5 % ophthalmic solution Place 1 drop into both eyes every morning.      B. Medications to start: - Digoxin 0.125 mcg M/W/F - Torsemide 20 mg   C. Medications to discontinue:   - Vitamin B1   Please make sure to return to the hospital if you have worsening swelling in your leg, shortness of breath or chest pain.  Please call our clinic if you have any questions or concerns, we may be able to help and keep you from a long and expensive emergency room wait. Our clinic and after hours phone number is [phone], the best time to call is Monday through Friday 9 am to 4 pm but there is always someone available 24/7 if you have an emergency. If you need medication refills please notify your pharmacy one week in advance and they will send [place] a request.

## 2023-12-26 NOTE — Consult Note (Signed)
Ref: Rana Snare, DO   Subjective:  Feeling stable. She is being discharged today. She was encouraged to keep her current doctors. VS stable.  Objective:  Vital Signs in the last 24 hours: Temp:  [97.6 F (36.4 C)-98.7 F (37.1 C)] 97.6 F (36.4 C) (01/24 1331) Pulse Rate:  [66-91] 66 (01/24 1331) Cardiac Rhythm: Normal sinus rhythm (01/24 0700) Resp:  [15-25] 17 (01/24 1331) BP: (137-159)/(66-81) 150/72 (01/24 1331) SpO2:  [93 %-99 %] 97 % (01/24 1331) Weight:  [39.3 kg] 39.3 kg (01/24 0353)  Physical Exam: BP Readings from Last 1 Encounters:  12/26/23 (!) 150/72     Wt Readings from Last 1 Encounters:  12/26/23 39.3 kg    Weight change: -0.707 kg Body mass index is 16.92 kg/m. HEENT: /AT, Eyes-Brown, Conjunctiva-Pink, Sclera-Non-icteric Neck: No JVD, No bruit, Trachea midline. Lungs:  Clear, Bilateral. Cardiac:  Regular rhythm, normal S1 and S2, no S3. II/VI systolic murmur. Abdomen:  Soft, non-tender. BS present. Extremities:  No edema present. No cyanosis. No clubbing. CNS: AxOx3, Cranial nerves grossly intact, moves all 4 extremities.  Skin: Warm and dry.   Intake/Output from previous day: 01/23 0701 - 01/24 0700 In: 360 [P.O.:360] Out: -     Lab Results: BMET    Component Value Date/Time   NA 137 12/26/2023 0317   NA 130 (L) 12/25/2023 0223   NA 136 12/23/2023 1826   NA 138 12/11/2023 1034   NA 139 05/08/2023 1438   NA 139 06/28/2021 1525   K 4.7 12/26/2023 0317   K 3.4 (L) 12/25/2023 0223   K 4.5 12/23/2023 1826   CL 106 12/26/2023 0317   CL 96 (L) 12/25/2023 0223   CL 106 12/23/2023 1826   CO2 23 12/26/2023 0317   CO2 24 12/25/2023 0223   CO2 22 12/23/2023 1826   GLUCOSE 99 12/26/2023 0317   GLUCOSE 96 12/25/2023 0223   GLUCOSE 128 (H) 12/23/2023 1826   BUN 24 (H) 12/26/2023 0317   BUN 19 12/25/2023 0223   BUN 23 12/23/2023 1826   BUN 21 12/11/2023 1034   BUN 29 (H) 05/08/2023 1438   BUN 22 06/28/2021 1525   CREATININE 1.18 (H)  12/26/2023 0317   CREATININE 1.39 (H) 12/25/2023 0223   CREATININE 1.25 (H) 12/23/2023 1826   CREATININE 1.21 (H) 06/12/2022 0807   CALCIUM 8.5 (L) 12/26/2023 0317   CALCIUM 8.1 (L) 12/25/2023 0223   CALCIUM 8.7 (L) 12/23/2023 1826   GFRNONAA 50 (L) 12/26/2023 0317   GFRNONAA 41 (L) 12/25/2023 0223   GFRNONAA 46 (L) 12/23/2023 1826   GFRNONAA 49 (L) 06/12/2022 0807   GFRAA 68 07/15/2019 1103   GFRAA 73 08/20/2018 1058   GFRAA >60 08/01/2017 1134   CBC    Component Value Date/Time   WBC 5.0 12/26/2023 0317   RBC 3.41 (L) 12/26/2023 0317   HGB 10.0 (L) 12/26/2023 0317   HGB 11.3 12/11/2023 1034   HCT 31.4 (L) 12/26/2023 0317   HCT 37.0 12/11/2023 1034   PLT 169 12/26/2023 0317   PLT 202 12/11/2023 1034   MCV 92.1 12/26/2023 0317   MCV 96 12/11/2023 1034   MCH 29.3 12/26/2023 0317   MCHC 31.8 12/26/2023 0317   RDW 16.0 (H) 12/26/2023 0317   RDW 14.4 12/11/2023 1034   LYMPHSABS 1.0 11/30/2023 1232   LYMPHSABS 1.7 06/28/2021 1525   MONOABS 0.4 11/30/2023 1232   EOSABS 0.1 11/30/2023 1232   EOSABS 0.2 06/28/2021 1525   BASOSABS 0.0 11/30/2023 1232  BASOSABS 0.0 06/28/2021 1525   HEPATIC Function Panel Recent Labs    02/07/23 0829 06/30/23 0805 09/12/23 1249 09/15/23 0332 09/18/23 0523 11/30/23 1232 12/01/23 0440  PROT 7.4 7.3 6.9  --   --  5.7*  --   ALBUMIN 4.5 4.1 3.3*   < > 2.7* 2.5* 2.5*  AST 18 17 55*  --   --  41  --   ALT 14 12 67*  --   --  45*  --   ALKPHOS 95 103 132*  --   --  117  --   BILIDIR <0.10 <0.10  --   --   --   --   --    < > = values in this interval not displayed.   HEMOGLOBIN A1C Lab Results  Component Value Date   MPG 114.02 09/16/2023   CARDIAC ENZYMES Lab Results  Component Value Date   CKTOTAL 42 03/28/2017   BNP No results for input(s): "PROBNP" in the last 8760 hours. TSH Recent Labs    12/01/23 0440  TSH 0.980   CHOLESTEROL Recent Labs    02/07/23 0829 06/30/23 0805 09/16/23 0451  CHOL 123 176 141     Scheduled Meds:  aspirin  81 mg Oral Daily   carvedilol  12.5 mg Oral BID WC   digoxin  0.125 mg Oral Q M,W,F   dorzolamide  1 drop Both Eyes Daily   feeding supplement  237 mL Oral TID BM   insulin aspart  0-9 Units Subcutaneous TID WC   latanoprost  1 drop Both Eyes QHS   magnesium chloride  1 tablet Oral Daily   potassium chloride  10 mEq Oral Daily   rivaroxaban  10 mg Oral Daily   ticagrelor  90 mg Oral BID   timolol  1 drop Both Eyes q morning   torsemide  20 mg Oral Daily   Continuous Infusions: PRN Meds:.polyethylene glycol  Assessment/Plan: Acute on chronic systolic left heart failure, HFrEF CKD, IIIa HTN Type 2 DM Moderate to severe MR Moderate TR Mild AI and PI CAD S/P RCA stent S/P cardiac defibrillator  Plan: Continue medical therapy. Continue f/u with heart failure team.   LOS: 2 days   Time spent including chart review, lab review, examination, discussion with patient : 30 min   [name]  MD  12/26/2023, 2:45 PM

## 2023-12-27 MED ORDER — CARVEDILOL 12.5 MG PO TABS
12.5000 mg | ORAL_TABLET | Freq: Two times a day (BID) | ORAL | 0 refills | Status: DC
  Filled 2023-12-27: qty 30, 15d supply, fill #0

## 2023-12-29 ENCOUNTER — Other Ambulatory Visit (HOSPITAL_COMMUNITY): Payer: Self-pay

## 2023-12-29 ENCOUNTER — Telehealth: Payer: Self-pay

## 2023-12-29 NOTE — Transitions of Care (Post Inpatient/ED Visit) (Signed)
   12/29/2023  Name: Amy Howell MRN: [id number] DOB: 1956-10-30  Today's TOC FU Call Status: Today's TOC FU Call Status:: Unsuccessful Call (1st Attempt) Unsuccessful Call (1st Attempt) Date: 12/29/23  Attempted to reach the patient regarding the most recent Inpatient/ED visit.  Follow Up Plan: Additional outreach attempts will be made to reach the patient to complete the Transitions of Care (Post Inpatient/ED visit) call.   Alyse Low, RN, BA, [facility], CRRN [facility] Coordinator, Transition of Care Ph # [phone]

## 2023-12-30 ENCOUNTER — Telehealth: Payer: Self-pay | Admitting: *Deleted

## 2023-12-30 NOTE — Transitions of Care (Post Inpatient/ED Visit) (Signed)
   12/30/2023  Name: Amy Howell MRN: [id number] DOB: 09-23-1956  Today's TOC FU Call Status: Today's TOC FU Call Status:: Unsuccessful Call (2nd Attempt) Unsuccessful Call (2nd Attempt) Date: 12/30/23  Attempted to reach the patient regarding the most recent Inpatient visit; left HIPAA compliant voice message requesting call back  Follow Up Plan: Additional outreach attempts will be made to reach the patient to complete the Transitions of Care (Post Inpatient visit) call.   Caryl Pina, RN, BSN, Media planner  Transitions of Care  [facility] [phone]: direct office

## 2023-12-31 ENCOUNTER — Telehealth: Payer: Self-pay

## 2023-12-31 NOTE — Transitions of Care (Post Inpatient/ED Visit) (Signed)
   12/31/2023  Name: Amy Howell MRN: [id number] DOB: [birth year]  Today's TOC FU Call Status: Today's TOC FU Call Status:: Unsuccessful Call (3rd Attempt) Unsuccessful Call (3rd Attempt) Date: 12/31/23  Attempted to reach the patient regarding the most recent Inpatient/ED visit.  Follow Up Plan: No further outreach attempts will be made at this time. We have been unable to contact the patient.  Alyse Low, RN, BA, [facility], CRRN [facility] Coordinator, Transition of Care Ph # [phone]

## 2024-01-02 ENCOUNTER — Encounter: Payer: Self-pay | Admitting: Pulmonary Disease

## 2024-01-06 ENCOUNTER — Ambulatory Visit: Payer: Medicare (Managed Care) | Admitting: Gastroenterology

## 2024-01-06 ENCOUNTER — Encounter: Payer: Self-pay | Admitting: Gastroenterology

## 2024-01-06 VITALS — BP 134/60 | HR 84 | Ht 59.5 in | Wt 78.5 lb

## 2024-01-06 DIAGNOSIS — I671 Cerebral aneurysm, nonruptured: Secondary | ICD-10-CM

## 2024-01-06 DIAGNOSIS — I3139 Other pericardial effusion (noninflammatory): Secondary | ICD-10-CM

## 2024-01-06 DIAGNOSIS — R634 Abnormal weight loss: Secondary | ICD-10-CM

## 2024-01-06 DIAGNOSIS — I5022 Chronic systolic (congestive) heart failure: Secondary | ICD-10-CM

## 2024-01-06 DIAGNOSIS — I2510 Atherosclerotic heart disease of native coronary artery without angina pectoris: Secondary | ICD-10-CM

## 2024-01-06 DIAGNOSIS — I255 Ischemic cardiomyopathy: Secondary | ICD-10-CM

## 2024-01-06 DIAGNOSIS — Z860100 Personal history of colon polyps, unspecified: Secondary | ICD-10-CM

## 2024-01-06 NOTE — Patient Instructions (Signed)
Follow-up as needed   _______________________________________________________  If your blood pressure at your visit was 140/90 or greater, please contact your primary care physician to follow up on this.  _______________________________________________________  If you are age 70 or older, your body mass index should be between 23-30. Your Body mass index is 15.59 kg/m. If this is out of the aforementioned range listed, please consider follow up with your Primary Care Provider.  If you are age 34 or younger, your body mass index should be between 19-25. Your Body mass index is 15.59 kg/m. If this is out of the aformentioned range listed, please consider follow up with your Primary Care Provider.   ________________________________________________________  The [facility] providers would like to encourage you to use [facility] to communicate with providers for non-urgent requests or questions.  Due to long hold times on the telephone, sending your provider a message by [facility] may be a faster and more efficient way to get a response.  Please allow 48 business hours for a response.  Please remember that this is for non-urgent requests.  _______________________________________________________  It was a pleasure to see you today!  Vito Cirigliano, D.O.

## 2024-01-06 NOTE — Progress Notes (Signed)
 Chief Complaint: Discuss colonoscopy  GI history: 68 year old female with a history of breast cancer status post right mastectomy, diabetes, CAD on Brilinta  (RCA stent placed 11/2022), CHF with reduced EF (02/03/2023 echo with LVEF 40-45%), CVA 09/2023 (while on Brilinta  and ASA), CKD 3, HTN, initially evaluated in the GI clinic in 08/2023 to assess for ongoing screening colonoscopy. Describes 2 previous colonoscopies, both performed in [place] , with polyps but uncertain how many or where.  Last was 10+ years ago.   HPI:     Amy Howell is a 68 y.o. female referred to the Gastroenterology Clinic for follow-up.  Was initially seen in the GI clinic on 08/05/2023 to evaluate for repeat colonoscopy for ongoing polyp surveillance.  Given heart failure with reduced EF 30-35% on TTE in 10/2023, procedure cannot be done in the outpatient [facility] and instead to be scheduled at [facility].    In the interim, she was actually hospitalized in 11/2023 with CHF exacerbation.  Inpatient GI service was consulted to evaluate for dysphagia which started after her CVA in October (CVA attributed to isosorbide ?).  Modified barium study was unremarkable.  Was having improved p.o. intake during that hospitalization following diuresis.  Barium esophagram without any high-grade stricture or stenosis, does show mild dysmotility in the pill transiently hung at the GE junction.  Ultimately decided that the risks outweigh the benefit of EGD given recent CHF exacerbation, active DAPT, and recent CVA, and elected for conservative management with outpatient follow-up.  She was readmitted in 12/2023 with another acute CHF exacerbation requiring diuresis and significant patient education on medication compliance per review of notes.  TTE on 12/24/2023 with EF 35-40%, mild LVH, severely elevated pulmonary artery systolic pressure, moderate pericardial effusion without tamponade, moderate TR, moderate AR  Today,  she states she has good PO intake. No longer with dysphagia. Laying flat again without having to prop up on pillows.   Currently weighs 78#. Will vary 75-77# on her home scale as she weighs herself daily now. Was 95# at last appt in this office in 08/2023, and 92# at [facility] f/u on 12/11/2023, and attributes the recent weight loss to significant diuresis and resolution of LE edema.   Per patient, planning on brian aneurysm surgery, but delayed due to weight.   Did have diarrhea that she attributed to vitamin B 1 supplement.  Recent normal GI PCR panel, fecal calprotectin, and fecal pancreatic elastase.  Stopped vitamin B1 supplement, and stools now back to baseline with formed, soft stools.  No diarrhea.  No hematochezia or melena.  Presents to the office with her daughter.  Past Medical History:  Diagnosis Date  . Cataracts, bilateral   . Cerebral infarction due to carotid artery stenosis (HCC)   . Diabetes mellitus type 2 in nonobese (HCC)   . Glaucoma   . Hypertension   . Hypertensive emergency 11/22/2022  . Stroke [facility])    no residual, series of mini strokes  . Tobacco abuse   . Vertigo      Past Surgical History:  Procedure Laterality Date  . ABDOMINAL HYSTERECTOMY    . CARDIAC DEFIBRILLATOR PLACEMENT    . CORONARY STENT INTERVENTION N/A 11/26/2022   Procedure: CORONARY STENT INTERVENTION;  Surgeon: Dann Candyce RAMAN, MD;  Location: [facility] INVASIVE CV LAB;  Service: Cardiovascular;  Laterality: N/A;  . gum operation    . Implantable loop recorder removal  02/01/2019   MDT Reveal  LINQ explantation in office by JA  . IR 3D INDEPENDENT WKST  05/07/2017  . IR 3D INDEPENDENT WKST  09/17/2023  . IR ANGIO INTRA EXTRACRAN SEL COM CAROTID INNOMINATE BILAT MOD SED  04/23/2017  . IR ANGIO INTRA EXTRACRAN SEL COM CAROTID INNOMINATE BILAT MOD SED  08/06/2017  . IR ANGIO INTRA EXTRACRAN SEL COM CAROTID INNOMINATE UNI R MOD SED  05/07/2017  . IR ANGIO INTRA EXTRACRAN SEL INTERNAL CAROTID BILAT MOD  SED  09/17/2023  . IR ANGIO VERTEBRAL SEL VERTEBRAL BILAT MOD SED  04/23/2017  . IR ANGIO VERTEBRAL SEL VERTEBRAL BILAT MOD SED  08/06/2017  . IR ANGIOGRAM FOLLOW UP STUDY  05/07/2017  . IR ANGIOGRAM SELECTIVE EACH ADDITIONAL VESSEL  05/07/2017  . IR RADIOLOGIST EVAL & MGMT  04/16/2017  . IR RADIOLOGIST EVAL & MGMT  05/29/2017  . IR RADIOLOGIST EVAL & MGMT  09/29/2023  . IR RADIOLOGIST EVAL & MGMT  11/05/2023  . IR TRANSCATH/EMBOLIZ  05/07/2017  . LOOP RECORDER INSERTION N/A 03/28/2017   Procedure: Loop Recorder Insertion;  Surgeon: Lynwood Rakers, MD;  Location: [facility] INVASIVE CV LAB;  Service: Cardiovascular;  Laterality: N/A;  . MASTECTOMY W/ SENTINEL NODE BIOPSY Right 06/25/2022   Procedure: RIGHT MASTECTOMY WITH AXILLARY SENTINEL LYMPH NODE BIOPSY;  Surgeon: Belinda Cough, MD;  Location: [facility];  Service: General;  Laterality: Right;  . RADIOLOGY WITH ANESTHESIA N/A 05/07/2017   Procedure: EMBOLIZATION;  Surgeon: Dolphus Carrion, MD;  Location: [facility] OR;  Service: Radiology;  Laterality: N/A;  . RADIOLOGY WITH ANESTHESIA N/A 08/06/2017   Procedure: RADIOLOGY WITH ANESTHESIA STENTING;  Surgeon: Dolphus Carrion, MD;  Location: [facility] OR;  Service: Radiology;  Laterality: N/A;  . RIGHT/LEFT HEART CATH AND CORONARY ANGIOGRAPHY N/A 11/26/2022   Procedure: RIGHT/LEFT HEART CATH AND CORONARY ANGIOGRAPHY;  Surgeon: Dann Candyce RAMAN, MD;  Location: [place] INVASIVE CV LAB;  Service: Cardiovascular;  Laterality: N/A;  . TEE WITHOUT CARDIOVERSION N/A 03/28/2017   Procedure: TRANSESOPHAGEAL ECHOCARDIOGRAM (TEE);  Surgeon: Redell RAMAN Shallow, MD;  Location: [facility] ENDOSCOPY;  Service: Cardiovascular;  Laterality: N/A;   Family History  Problem Relation Age of Onset  . Dementia Mother   . Kidney disease Father   . Hypertension Father   . Diabetes Father   . Heart disease Father   . Lung cancer Maternal Aunt        dx after 50  . Prostate cancer Other        MGM's brother  . Breast cancer Neg Hx   .  Colon cancer Neg Hx   . Liver disease Neg Hx   . Esophageal cancer Neg Hx    Social History   Tobacco Use  . Smoking status: Former    Current packs/day: 0.50    Average packs/day: 0.5 packs/day for 56.0 years (28.0 ttl pk-yrs)    Types: Cigarettes  . Smokeless tobacco: Never  Vaping Use  . Vaping status: Never Used  Substance Use Topics  . Alcohol use: No  . Drug use: No   Current Outpatient Medications  Medication Sig Dispense Refill  . brimonidine  (ALPHAGAN ) 0.2 % ophthalmic solution Place 1 drop into both eyes at bedtime.    . aspirin  81 MG chewable tablet Chew 1 tablet (81 mg total) by mouth daily. 30 tablet 3  . carvedilol  (COREG ) 12.5 MG tablet Take 1 tablet (12.5 mg total) by mouth 2 (two) times daily with a meal. 30 tablet 0  . digoxin  (LANOXIN ) 0.125 MG tablet Take 1 tablet (0.125  mg total) by mouth every Monday, Wednesday, and Friday. (Patient not taking: Reported on 01/06/2024) 12 tablet 0  . dorzolamide  (TRUSOPT ) 2 % ophthalmic solution Instill 1 drop into both eyes twice a day 30 mL 3  . feeding supplement (ENSURE ENLIVE / ENSURE PLUS) LIQD Take 237 mLs by mouth 3 (three) times daily between meals. (Patient taking differently: Take 237 mLs by mouth daily.) 237 mL 12  . [Paused] hydrochlorothiazide  (HYDRODIURIL ) 12.5 MG tablet Take 12.5 mg by mouth daily. (Patient not taking: Reported on 12/24/2023)    . latanoprost  (XALATAN ) 0.005 % ophthalmic solution Instill 1 drop into both eyes at bedtime 22.5 mL 3  . mirtazapine  (REMERON  SOL-TAB) 15 MG disintegrating tablet Take 7.5 mg by mouth at bedtime. (Patient not taking: Reported on 12/24/2023)    . Multiple Vitamin (MULTIVITAMIN WITH MINERALS) TABS tablet Take 1 tablet by mouth daily. 30 tablet 0  . [Paused] spironolactone  (ALDACTONE ) 25 MG tablet Take 1/2 tablet (12.5 mg total) by mouth daily. (Patient not taking: Reported on 12/24/2023) 30 tablet 11  . ticagrelor  (BRILINTA ) 90 MG TABS tablet Take 1 tablet (90 mg total) by mouth 2  (two) times daily. 60 tablet 11  . timolol  (TIMOPTIC ) 0.5 % ophthalmic solution Place 1 drop into both eyes every morning. 45 mL 3  . torsemide  (DEMADEX ) 20 MG tablet Take 1 tablet (20 mg total) by mouth daily. 30 tablet 0   No current facility-administered medications for this visit.   Allergies  Allergen Reactions  . Bidil  [Isosorb Dinitrate-Hydralazine ] Other (See Comments)    Caused a stroke  . Chlorhexidine  Dermatitis  . Lisinopril  Other (See Comments)    Angioedema  . Pork-Derived Products Hives  . Rosuvastatin  Other (See Comments)    headache  . Statins Other (See Comments)    Headaches and swelling     Review of Systems: All systems reviewed and negative except where noted in HPI.     Physical Exam:    Wt Readings from Last 3 Encounters:  01/06/24 78 lb 8 oz (35.6 kg)  12/26/23 86 lb 10.3 oz (39.3 kg)  12/11/23 92 lb 14.4 oz (42.1 kg)    BP 134/60 (BP Location: Left Arm, Patient Position: Sitting, Cuff Size: Normal)   Pulse 84   Ht 4' 11.5 (1.511 m)   Wt 78 lb 8 oz (35.6 kg)   BMI 15.59 kg/m  Constitutional:  Pleasant, in no acute distress. Psychiatric: Normal mood and affect. Behavior is normal. Neurological: Alert and oriented to person place and time. Skin: Skin is warm and dry. No rashes noted.   ASSESSMENT AND PLAN;   1) History of colon polyps Last colonoscopy was 10+ years ago in [place] and reportedly notable for polyps.  No prior records available for review.  I had a in-depth conversation with the patient and her daughter today.  She is otherwise without any active GI symptoms at this time.  After careful consideration, she feels that the risks far outweigh the benefit of repeat colonoscopy from a screening/surveillance standpoint given her active cardiopulmonary disease, recent CVA, weight instability, and potential upcoming IR procedure for brain aneurysm.  She therefore does not want to proceed with any endoscopic procedures at all unless emergent  and life-threatening.  2) Dysphagia - Resolved.  Tolerating all p.o. intake  3) Weight loss Per chart review, has lost nearly 20#, but the bulk of that being over the last month or so which she attributes to aggressive diuresis and taking her diuretics regularly.  Had significant LE edema (shows me images on her phone today) which has since resolved.  Otherwise good p.o. intake. - As above, we discussed the role/utility of EGD and colonoscopy, and she politely declines any endoscopic procedures in light of her overall health concerns - Continue trending weight  4) CHF 5) CAD 6) Chronic antiplatelet therapy 7) History of CVA 8) Pericardial effusion - As above, patient feels that risks outweigh benefits for any diagnostic endoscopic procedures at this juncture.  Does not wish to schedule any procedures or any specific GI follow-up. -To follow-up with Cardiology and Heart Failure Clinic, PCM, and IR  RTC prn  I spent 40 minutes of time, including in depth chart review, independent review of results as outlined above, communicating results with the patient directly, face-to-face time with the patient, coordinating care, ordering studies and medications as appropriate, and documentation.   Sandor LULLA Flatter, DO, FACG  01/06/2024, 11:24 AM   Elicia Sharper, DO

## 2024-01-07 ENCOUNTER — Telehealth: Payer: Self-pay | Admitting: Adult Health

## 2024-01-07 NOTE — Telephone Encounter (Signed)
 Pt's daughter would like a c/b from nurse regarding pt's appt tomorrow. Daughter states that she has some things that she would like to bring to the providers attention before her mother's appt. Please advise

## 2024-01-07 NOTE — Progress Notes (Signed)
 Cardiology Office Note:  .   Date:  01/08/2024  ID:  Amy Howell, DOB [birth year], MRN [id number] PCP: Elicia Sharper, DO  [facility] Providers Cardiologist:  Alvan Ronal BRAVO, MD }   History of Present Illness: .   Amy Howell is a 67 y.o. female with a PMH significant for HFrEF, hyperlipidemia, invasive lobular carcinoma of the breast s/p resection, refusing chemotherapy, cachexia, anemia, hypertension, CVA, saccular aneurysms,CKD 3A,  with admission for HFrEF exacerbation, with dyspnea and LEE. She was continued on coreg  6.25 mg BID, ASA 81 mg daily, Brilinta  90 mg BID.[name] She was also suffering with dysphasia, which was improved with diureses, she was to follow up with GI.   Recent admission to [facility] from 12/23/2023 through 12/26/2023 for acute exacerbation of CHF, hyponatremia.  She was treated with IV Lasix  with majority of her fluid retention seen in the right left extremity she was seen by Dr. Claudene on consultation.  She was very reluctant to change medication regimen but she did agree to starting torsemide  20 mg daily, it was recommended that she have digoxin  0.125 mg on Monday Wednesday Friday she was to continue carvedilol  12.5 mg twice daily.  She was not placed on ARB, spironolactone , ACE, or SGLT2 during hospitalization.  She is here for posthospitalization follow-up.  Today she comes with multiple complaints about her hospitalization and medications.  She is chosen to follow with [facility] instead of Dr. Claudene.  She is worried about becoming too thin as she lost a lot of weight during hospitalization.  She is reluctant to try any new medications.  She was unaware of her reduced heart function although her daughter states they have told her this.  She is taking prescribed medicines.  Blood pressure remains elevated.  She denies dizziness, chest pain, dyspnea on exertion, or fatigue.  She is very sensitive to medications.  She was not aware of her kidney disease.   She is doing her best to avoid salt.  She has not been seen by [facility].  ROS: As above otherwise negative  Studies Reviewed: .      Echocardiogram 12/24/2023  1. Left ventricular ejection fraction, by estimation, is 35 to 40%. The  left ventricle has moderately decreased function. The left ventricle  demonstrates global hypokinesis. There is mild left ventricular  hypertrophy. Left ventricular diastolic  parameters are consistent with Grade II diastolic dysfunction  (pseudonormalization). Elevated left atrial pressure.   2. Right ventricular systolic function is mildly reduced. The right  ventricular size is normal. There is severely elevated pulmonary artery  systolic pressure. The estimated right ventricular systolic pressure is  65.7 mmHg.   3. Left atrial size was severely dilated.   4. Right atrial size was mildly dilated.   5. Moderate pericardial effusion. Meaures 1.8cm adjacent to RV. There is  no evidence of cardiac tamponade. Fixed/dilated IVC but no RA/RV collapse  seen   6. The mitral valve is abnormal. Moderate to severe mitral valve  regurgitation.   7. Tricuspid valve regurgitation is moderate.   8. The aortic valve is tricuspid. Aortic valve regurgitation is moderate.  No aortic stenosis is present.   9. The inferior vena cava is dilated in size with <50% respiratory  variability, suggesting right atrial pressure of 15 mmHg.   RHC/LHC 11/26/2022 1st Mrg lesion is 50% stenosed.   Mid RCA lesion is 75% stenosed.  A drug-eluting stent was successfully placed using a SYNERGY XD 3.0X20, postdilated to  greater than 3.5 mm.   Post intervention, there is a 0% residual stenosis.   There is moderate left ventricular systolic dysfunction.   LV end diastolic pressure is moderately elevated.   The left ventricular ejection fraction is 35-45% by visual estimate.   There is no aortic valve stenosis.   Right ulnar artery looks slightly larger.  There was some  tortuosity and a branch vessel which the wire selected initially.  With a JR 4 catheter versa core wire or was was directed more meat daily and went into the radial artery.   Aortic saturation 96%, PA saturation 70%, PA pressure 29/12, mean PA pressure 20 mmHg, mean pulmonary capillary wedge pressure 5 mmHg, mean right atrial pressure 1 mmHg, cardiac output 5.02 L/min, cardiac index 3.58.   Normal right heart pressures.   Successful PCI of the mid RCA.  Will change antiplatelet therapy to aspirin  81 mg daily and clopidogrel  75 mg daily.  Would have her stop the Brilinta  45 mg twice daily dose.  She will need aggressive secondary prevention.  She will need aggressive medical therapy for her LV dysfunction.  Physical Exam:   VS:  Ht 5' (1.524 m)   Wt 79 lb (35.8 kg)   BMI 15.43 kg/m    Wt Readings from Last 3 Encounters:  01/08/24 79 lb (35.8 kg)  01/06/24 78 lb 8 oz (35.6 kg)  12/26/23 86 lb 10.3 oz (39.3 kg)    GEN: Well nourished, well developed in no acute distress, thin. NECK: No JVD; No carotid bruits CARDIAC: RRR, 2/6 systolic murmur heard best at the left sternal border, soft systolic murmur at the right sternal border murmurs, rubs, gallops RESPIRATORY:  Clear to auscultation without rales, wheezing or rhonchi  ABDOMEN: Soft, non-tender, non-distended EXTREMITIES:  No edema; No deformity   ASSESSMENT AND PLAN: .    HFrEF: Recent hospitalization for decompensation with echocardiogram as above reduced EF to 35 to 40%.  She is very reluctant to take medications and this not want to take any new ones if she can help it.  She is willing to try Jardiance  10 mg but because of her small stature and weight she was only willing to take a half a tablet daily.  We will try this.  I would like to have her be considered for [facility] but she is unwilling to add any other medications at this time.  She will continue her torsemide  low-sodium diet and daily weights as directed.  Will take it a step at  a time and see how she is tolerating the medications.  She is very wary of physicians and providers and medications that she has to take.  I will hold off on starting back spironolactone , or adding ARB.  She will follow-up in 2 weeks with a BMET in 1 month for follow-up in the office.  2.  CAD: Drug-eluting stent to the mid RCA in 2023.  She remains on Brilinta , aspirin , she and carvedilol  12.5 mg twice daily.  She is without complaint of chest pain or fatigue.  She will continue on digoxin  Monday Wednesday Fridays.  3.  Hyperlipidemia: She does not tolerate statins and is not interested in PCSK9 inhibitors.  4.  Chronic kidney disease stage IIIa: She will follow-up with primary care and evaluate her labs periodically.  Hopefully adding SGLT will be renal protective most recent creatinine was 1.9.         Signed, Lamarr HERO. Jerilynn CHOL, ANP, AACC

## 2024-01-07 NOTE — Telephone Encounter (Signed)
 Left message to call back, Mardi Shaker, .

## 2024-01-08 ENCOUNTER — Ambulatory Visit: Payer: Medicare (Managed Care) | Attending: Adult Health | Admitting: Adult Health

## 2024-01-08 ENCOUNTER — Other Ambulatory Visit (HOSPITAL_COMMUNITY): Payer: Self-pay

## 2024-01-08 ENCOUNTER — Encounter: Payer: Self-pay | Admitting: Adult Health

## 2024-01-08 VITALS — BP 158/78 | HR 81 | Ht 60.0 in | Wt 79.0 lb

## 2024-01-08 DIAGNOSIS — I5042 Chronic combined systolic (congestive) and diastolic (congestive) heart failure: Secondary | ICD-10-CM

## 2024-01-08 DIAGNOSIS — I2510 Atherosclerotic heart disease of native coronary artery without angina pectoris: Secondary | ICD-10-CM

## 2024-01-08 DIAGNOSIS — E43 Unspecified severe protein-calorie malnutrition: Secondary | ICD-10-CM

## 2024-01-08 DIAGNOSIS — I10 Essential (primary) hypertension: Secondary | ICD-10-CM

## 2024-01-08 DIAGNOSIS — Z79899 Other long term (current) drug therapy: Secondary | ICD-10-CM | POA: Diagnosis not present

## 2024-01-08 DIAGNOSIS — N183 Chronic kidney disease, stage 3 unspecified: Secondary | ICD-10-CM

## 2024-01-08 LAB — ALDOSTERONE + RENIN ACTIVITY W/ RATIO
ALDO / PRA Ratio: <5.7 (ref 0.0–30.0)
Aldosterone: <1 ng/dL (ref 0.0–30.0)
PRA LC/MS/MS: 0.175 ng/mL/h (ref 0.167–5.380)

## 2024-01-08 MED ORDER — EMPAGLIFLOZIN 10 MG PO TABS
ORAL_TABLET | ORAL | 3 refills | Status: DC
  Filled 2024-01-08: qty 45, 90d supply, fill #0

## 2024-01-08 MED ORDER — EMPAGLIFLOZIN 10 MG PO TABS: ORAL_TABLET | ORAL | 0 refills | Status: DC

## 2024-01-08 NOTE — Patient Instructions (Signed)
 Medication Instructions:  START JARDIANCE  5 MG DAILY (1/2 TABLET) *If you need a refill on your cardiac medications before your next appointment, please call your pharmacy*   Lab Work: BMET IN 2 WEEKS If you have labs (blood work) drawn today and your tests are completely normal, you will receive your results only by: MyChart Message (if you have MyChart) OR A paper copy in the mail If you have any lab test that is abnormal or we need to change your treatment, we will call you to review the results.   Testing/Procedures: NO TESTING   Follow-Up: At [facility], you and your health needs are our priority.  As part of our continuing mission to provide you with exceptional heart care, we have created designated Provider Care Teams.  These Care Teams include your primary Cardiologist (physician) and Advanced Practice Providers (APPs -  Physician Assistants and Nurse Practitioners) who all work together to provide you with the care you need, when you need it.   Your next appointment:   1 month(s)  Provider:   Alvan Ronal BRAVO, MD   Other Instructions JARDIANCE  SAMPLES GIVEN TODAY

## 2024-01-08 NOTE — Telephone Encounter (Signed)
 Per chart review patient presented to 2/6 OV  No further nursing outreach needed at this time

## 2024-01-09 ENCOUNTER — Ambulatory Visit: Payer: Medicare (Managed Care) | Admitting: Student

## 2024-01-09 ENCOUNTER — Encounter: Payer: Self-pay | Admitting: Student

## 2024-01-09 VITALS — BP 174/87 | HR 84 | Temp 97.5°F | Ht 60.0 in | Wt 79.7 lb

## 2024-01-09 DIAGNOSIS — I5022 Chronic systolic (congestive) heart failure: Secondary | ICD-10-CM

## 2024-01-09 DIAGNOSIS — C50311 Malignant neoplasm of lower-inner quadrant of right female breast: Secondary | ICD-10-CM

## 2024-01-09 DIAGNOSIS — E119 Type 2 diabetes mellitus without complications: Secondary | ICD-10-CM

## 2024-01-09 DIAGNOSIS — I110 Hypertensive heart disease with heart failure: Secondary | ICD-10-CM

## 2024-01-09 DIAGNOSIS — Z13820 Encounter for screening for osteoporosis: Secondary | ICD-10-CM | POA: Insufficient documentation

## 2024-01-09 DIAGNOSIS — R7303 Prediabetes: Secondary | ICD-10-CM | POA: Diagnosis not present

## 2024-01-09 DIAGNOSIS — I10 Essential (primary) hypertension: Secondary | ICD-10-CM

## 2024-01-09 LAB — POCT GLYCOSYLATED HEMOGLOBIN (HGB A1C): Hemoglobin A1C: 5.7 % — AB (ref 4.0–5.6)

## 2024-01-09 LAB — GLUCOSE, CAPILLARY: Glucose-Capillary: 103 mg/dL — ABNORMAL HIGH (ref 70–99)

## 2024-01-09 NOTE — Assessment & Plan Note (Signed)
 Patient has a past medical history of breast cancer.  She has been lost to follow-up.  Encouraged patient to reestablish with oncology for surveillance.

## 2024-01-09 NOTE — Assessment & Plan Note (Signed)
 Referred patient for osteoporosis screening today with DEXA scan

## 2024-01-09 NOTE — Progress Notes (Signed)
 CC: Blood pressure follow-up and hospital follow-up  HPI:  [name] is a 68 y.o. female with past medical history of HFrEF, hypertension, type 2 diabetes who presents for follow-up appointment.  Please see assessment and plan for full HPI.  Medications: Ischemic cardiomyopathy: Aspirin  81 mg daily, Brilinta  90 mg twice daily HFrEF: Coreg  12.5 mg twice daily, Jardiance  10 mg daily torsemide  20 mg daily   Past Medical History:  Diagnosis Date   Cataracts, bilateral    Cerebral infarction due to carotid artery stenosis (HCC)    Diabetes mellitus type 2 in nonobese (HCC)    Glaucoma    Hypertension    Hypertensive emergency 11/22/2022   Stroke [facility]    no residual, series of mini strokes   Tobacco abuse    Vertigo      Current Outpatient Medications:    aspirin  81 MG chewable tablet, Chew 1 tablet (81 mg total) by mouth daily., Disp: 30 tablet, Rfl: 3   brimonidine  (ALPHAGAN ) 0.2 % ophthalmic solution, Place 1 drop into both eyes at bedtime., Disp: , Rfl:    carvedilol  (COREG ) 12.5 MG tablet, Take 1 tablet (12.5 mg total) by mouth 2 (two) times daily with a meal., Disp: 30 tablet, Rfl: 0   digoxin  (LANOXIN ) 0.125 MG tablet, Take 1 tablet (0.125 mg total) by mouth every Monday, Wednesday, and Friday. (Patient not taking: Reported on 01/08/2024), Disp: 12 tablet, Rfl: 0   dorzolamide  (TRUSOPT ) 2 % ophthalmic solution, Instill 1 drop into both eyes twice a day, Disp: 30 mL, Rfl: 3   empagliflozin  (JARDIANCE ) 10 MG TABS tablet, Take 1/2 tablet (5 mg) by mouth daily, Disp: 45 tablet, Rfl: 3   empagliflozin  (JARDIANCE ) 10 MG TABS tablet, Take 0.5 tablet (5 mg) by mouth daily, Disp: 14 tablet, Rfl: 0   feeding supplement (ENSURE ENLIVE / ENSURE PLUS) LIQD, Take 237 mLs by mouth 3 (three) times daily between meals. (Patient not taking: Reported on 01/08/2024), Disp: 237 mL, Rfl: 12   [Paused] hydrochlorothiazide  (HYDRODIURIL ) 12.5 MG tablet, Take 12.5 mg by mouth daily. (Patient not  taking: Reported on 12/24/2023), Disp: , Rfl:    latanoprost  (XALATAN ) 0.005 % ophthalmic solution, Instill 1 drop into both eyes at bedtime, Disp: 22.5 mL, Rfl: 3   mirtazapine  (REMERON  SOL-TAB) 15 MG disintegrating tablet, Take 7.5 mg by mouth at bedtime. (Patient not taking: Reported on 12/24/2023), Disp: , Rfl:    Multiple Vitamin (MULTIVITAMIN WITH MINERALS) TABS tablet, Take 1 tablet by mouth daily. (Patient not taking: Reported on 01/08/2024), Disp: 30 tablet, Rfl: 0   [Paused] spironolactone  (ALDACTONE ) 25 MG tablet, Take 1/2 tablet (12.5 mg total) by mouth daily. (Patient not taking: Reported on 12/24/2023), Disp: 30 tablet, Rfl: 11   ticagrelor  (BRILINTA ) 90 MG TABS tablet, Take 1 tablet (90 mg total) by mouth 2 (two) times daily., Disp: 60 tablet, Rfl: 11   timolol  (TIMOPTIC ) 0.5 % ophthalmic solution, Place 1 drop into both eyes every morning., Disp: 45 mL, Rfl: 3   torsemide  (DEMADEX ) 20 MG tablet, Take 1 tablet (20 mg total) by mouth daily., Disp: 30 tablet, Rfl: 0  Review of Systems:    Respiratory: Patient denies any shortness of breath Cardiovascular: Patient denies any chest pain or lower extremity edema  Physical Exam:  Vitals:   01/09/24 1041 01/09/24 1120  BP: (!) 177/69 (!) 174/87  Pulse: 78 84  Temp: (!) 97.5 F (36.4 C)   TempSrc: Oral   SpO2: 100%   Weight: 79 lb 11.2  oz (36.2 kg)   Height: 5' (1.524 m)     General: Patient is sitting comfortably in the room  Cardio: Regular rate and rhythm, no murmurs, rubs or gallops Pulmonary: Clear to ausculation bilaterally with no rales, rhonchi, and crackles  Extremities: Bilateral lower extremities with no ulcers, erythema, or signs of infection.  Sensation intact bilaterally.   Assessment & Plan:   Chronic heart failure with reduced ejection fraction (HFrEF, <= 40%) (HCC) Patient has a past medical history of heart failure with reduced ejection fraction.  She was recently hospitalized for acute heart failure  exacerbation and required IV diuresis.  At that time, patient admitted that she has not been taking all of her medications as prescribed.  Patient was hesitant to start many medications.  She agreed to start some medications.  Of note, patient is on Coreg  12.5 mg twice daily and Jardiance  10 mg daily.  She is also on torsemide  20 mg daily.  Since leaving the hospital, she denies any shortness of breath, chest pain, or lower extremity edema.  She states she is doing well.  Has no concerns.  She has been to her cardiologist office as well.  Plan: -Continue to follow cardiology -Plan for BMP in 2 weeks with cardiology -Continue Coreg  12.5 mg daily -Continue Jardiance  10 mg daily -Plan is to resume either spironolactone  or hydrochlorothiazide  at next visit with cardiology  Hypertension Patient has a past medical history of hypertension.  She did not take her medications today.  On previous hospitalization patient had hydrochlorothiazide  and spironolactone  help.  She did not take her medications this morning.  Her blood pressure today is elevated into the 170s.  Her current medications include Coreg  12.5 mg daily and torsemide  20 mg daily.  Patient has had spironolactone  and HCTZ held.  Will need to restart once patient has BMP in 2 weeks.  Plan: -Continue Coreg  12.5 mg twice daily -Continue torsemide  20 mg daily -Once patient obtains BMP can restart her spironolactone  and HCTZ  Prediabetes A1c today 5.7.  Counseled patient on lifestyle modifications.  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  She has been lost to follow-up.  Encouraged patient to reestablish with oncology for surveillance.  Osteoporosis screening Referred patient for osteoporosis screening today with DEXA scan  Patient discussed with Dr.  Ronnald Sergeant  Libby Blanch, DO PGY-2 Internal  Medicine Resident  Pager: [phone]

## 2024-01-09 NOTE — Assessment & Plan Note (Signed)
 Patient has a past medical history of hypertension.  She did not take her medications today.  On previous hospitalization patient had hydrochlorothiazide  and spironolactone  help.  She did not take her medications this morning.  Her blood pressure today is elevated into the 170s.  Her current medications include Coreg  12.5 mg daily and torsemide  20 mg daily.  Patient has had spironolactone  and HCTZ held.  Will need to restart once patient has BMP in 2 weeks.  Plan: -Continue Coreg  12.5 mg twice daily -Continue torsemide  20 mg daily -Once patient obtains BMP can restart her spironolactone  and HCTZ

## 2024-01-09 NOTE — Assessment & Plan Note (Signed)
 A1c today 5.7.  Counseled patient on lifestyle modifications.  Plan: -Counseled patient on lifestyle modifications

## 2024-01-09 NOTE — Assessment & Plan Note (Addendum)
 Patient has a past medical history of heart failure with reduced ejection fraction.  She was recently hospitalized for acute heart failure exacerbation and required IV diuresis.  At that time, patient admitted that she has not been taking all of her medications as prescribed.  Patient was hesitant to start many medications.  She agreed to start some medications.  Of note, patient is on Coreg  12.5 mg twice daily and Jardiance  10 mg daily.  She is also on torsemide  20 mg daily.  Since leaving the hospital, she denies any shortness of breath, chest pain, or lower extremity edema.  She states she is doing well.  Has no concerns.  She has been to her cardiologist office as well.  Plan: -Continue to follow cardiology -Plan for BMP in 2 weeks with cardiology -Continue Coreg  12.5 mg daily -Continue Jardiance  10 mg daily -Plan is to resume either spironolactone  or hydrochlorothiazide  at next visit with cardiology

## 2024-01-09 NOTE — Patient Instructions (Addendum)
 Amy Howell,Thank you for allowing me to take part in your care today.  Here are your instructions.  1.  Continue taking all of your medications as prescribed.  2.  Come back in 3 months.  3.  Please follow-up with your cardiologist in 2 weeks.  4. I have referred you for DEXA scan.  5.  Please call your oncologist to follow-up.  Thank you, Dr. Tobie  If you have any other questions please contact the internal medicine clinic at [phone] If it is after hours, please call the [facility] at [phone] and then ask the person who picks up for the resident on call.

## 2024-01-14 NOTE — Progress Notes (Signed)
 Internal Medicine Clinic Attending  Case discussed with the resident at the time of the visit.  We reviewed the resident's history and exam and pertinent patient test results.  I agree with the assessment, diagnosis, and plan of care documented in the resident's note.

## 2024-01-18 ENCOUNTER — Other Ambulatory Visit: Payer: Self-pay | Admitting: Student

## 2024-01-19 ENCOUNTER — Other Ambulatory Visit: Payer: Self-pay | Admitting: Student

## 2024-01-19 ENCOUNTER — Other Ambulatory Visit (HOSPITAL_COMMUNITY): Payer: Self-pay

## 2024-01-19 MED ORDER — DIGOXIN 125 MCG PO TABS
0.1250 mg | ORAL_TABLET | ORAL | 0 refills | Status: DC
  Filled 2024-01-19: qty 12, 28d supply, fill #0

## 2024-01-19 MED ORDER — CARVEDILOL 12.5 MG PO TABS
12.5000 mg | ORAL_TABLET | Freq: Two times a day (BID) | ORAL | 3 refills | Status: DC
  Filled 2024-01-19: qty 30, 15d supply, fill #0
  Filled 2024-02-07: qty 30, 15d supply, fill #1
  Filled 2024-02-29: qty 30, 15d supply, fill #2
  Filled 2024-03-18: qty 30, 15d supply, fill #3

## 2024-01-20 ENCOUNTER — Other Ambulatory Visit (HOSPITAL_COMMUNITY): Payer: Self-pay

## 2024-02-20 ENCOUNTER — Other Ambulatory Visit (HOSPITAL_COMMUNITY): Payer: Self-pay

## 2024-02-20 ENCOUNTER — Encounter (HOSPITAL_COMMUNITY): Payer: Self-pay

## 2024-03-05 ENCOUNTER — Other Ambulatory Visit (HOSPITAL_COMMUNITY): Payer: Self-pay

## 2024-03-05 DIAGNOSIS — H401111 Primary open-angle glaucoma, right eye, mild stage: Secondary | ICD-10-CM | POA: Diagnosis not present

## 2024-03-05 DIAGNOSIS — H401123 Primary open-angle glaucoma, left eye, severe stage: Secondary | ICD-10-CM | POA: Diagnosis not present

## 2024-03-05 DIAGNOSIS — H538 Other visual disturbances: Secondary | ICD-10-CM | POA: Diagnosis not present

## 2024-03-05 DIAGNOSIS — I639 Cerebral infarction, unspecified: Secondary | ICD-10-CM | POA: Diagnosis not present

## 2024-03-05 LAB — HM DIABETES EYE EXAM

## 2024-03-05 MED ORDER — DORZOLAMIDE HCL 2 % OP SOLN
1.0000 [drp] | Freq: Two times a day (BID) | OPHTHALMIC | 6 refills | Status: AC
  Filled 2024-03-05: qty 10, 50d supply, fill #0

## 2024-03-05 MED ORDER — TIMOLOL MALEATE 0.5 % OP SOLN
1.0000 [drp] | Freq: Every morning | OPHTHALMIC | 3 refills | Status: DC
  Filled 2024-03-05: qty 10, 100d supply, fill #0
  Filled 2024-07-26: qty 10, 100d supply, fill #1

## 2024-03-18 ENCOUNTER — Encounter (HOSPITAL_COMMUNITY): Payer: Self-pay

## 2024-03-18 ENCOUNTER — Other Ambulatory Visit: Payer: Self-pay

## 2024-03-18 ENCOUNTER — Other Ambulatory Visit: Payer: Self-pay | Admitting: Student

## 2024-03-18 ENCOUNTER — Other Ambulatory Visit (HOSPITAL_COMMUNITY): Payer: Self-pay

## 2024-03-18 MED ORDER — TORSEMIDE 20 MG PO TABS
20.0000 mg | ORAL_TABLET | Freq: Every day | ORAL | 2 refills | Status: AC
  Filled 2024-03-18: qty 30, 30d supply, fill #0
  Filled 2024-04-22: qty 30, 30d supply, fill #1
  Filled 2024-05-18: qty 30, 30d supply, fill #2

## 2024-03-18 MED ORDER — CARVEDILOL 12.5 MG PO TABS
12.5000 mg | ORAL_TABLET | Freq: Two times a day (BID) | ORAL | 11 refills | Status: DC
  Filled 2024-03-18: qty 60, 30d supply, fill #0
  Filled 2024-04-22: qty 60, 30d supply, fill #1
  Filled 2024-05-18: qty 60, 30d supply, fill #2
  Filled 2024-06-20: qty 60, 30d supply, fill #3

## 2024-03-18 NOTE — Telephone Encounter (Signed)
 Copied from CRM [id number]. Topic: Clinical - Medication Refill >> Mar 18, 2024  8:04 AM Madelyne Schiff wrote: Most Recent Primary Care Visit:  Provider: Jonelle Neri  Department: [facility]  Visit Type: OPEN ESTABLISHED  Date: 01/09/2024  Medication: torsemide (DEMADEX) 20 MG tablet  carvedilol (COREG) 12.5 MG table    Has the patient contacted their pharmacy? Yes (Agent: If no, request that the patient contact the pharmacy for the refill. If patient does not wish to contact the pharmacy document the reason why and proceed with request.) (Agent: If yes, when and what did the pharmacy advise?)  Is this the correct pharmacy for this prescription? Yes If no, delete pharmacy and type the correct one.  This is the patient's preferred pharmacy:  [facility] [facility] [id number] [place] [address] Phone: [phone] Fax: [phone]     Is the patient out of the medication? No  (2 left)   Has the patient been seen for an appointment in the last year OR does the patient have an upcoming appointment? Yes  Can we respond through MyChart? Yes  Agent: Please be advised that Rx refills may take up to 3 business days. We ask that you follow-up with your pharmacy.

## 2024-03-18 NOTE — Telephone Encounter (Signed)
 Copied from CRM [id number]. Topic: Clinical - Medication Question >> Mar 18, 2024  8:12 AM Carrielelia G wrote: Reason for CRM: Pt's Daughter called an would like to know if the "Quantity" on her mothers medication  Carvedilol (12.5 mg Oral 2 times daily with meals)  be increased . Please advise

## 2024-03-19 ENCOUNTER — Other Ambulatory Visit: Payer: Self-pay

## 2024-04-22 ENCOUNTER — Other Ambulatory Visit (HOSPITAL_COMMUNITY): Payer: Self-pay

## 2024-05-06 ENCOUNTER — Encounter: Payer: Medicare (Managed Care) | Admitting: Student

## 2024-05-29 ENCOUNTER — Encounter (HOSPITAL_COMMUNITY): Payer: Self-pay | Admitting: Interventional Radiology

## 2024-07-09 ENCOUNTER — Telehealth: Payer: Self-pay

## 2024-07-09 ENCOUNTER — Ambulatory Visit: Payer: Medicare (Managed Care) | Admitting: Student

## 2024-07-09 VITALS — BP 171/84 | HR 72 | Temp 97.4°F | Ht 60.0 in | Wt 75.0 lb

## 2024-07-09 DIAGNOSIS — I639 Cerebral infarction, unspecified: Secondary | ICD-10-CM | POA: Diagnosis not present

## 2024-07-09 DIAGNOSIS — H401123 Primary open-angle glaucoma, left eye, severe stage: Secondary | ICD-10-CM | POA: Diagnosis not present

## 2024-07-09 DIAGNOSIS — I110 Hypertensive heart disease with heart failure: Secondary | ICD-10-CM | POA: Diagnosis not present

## 2024-07-09 DIAGNOSIS — E119 Type 2 diabetes mellitus without complications: Secondary | ICD-10-CM

## 2024-07-09 DIAGNOSIS — I5022 Chronic systolic (congestive) heart failure: Secondary | ICD-10-CM

## 2024-07-09 DIAGNOSIS — R634 Abnormal weight loss: Secondary | ICD-10-CM

## 2024-07-09 DIAGNOSIS — I159 Secondary hypertension, unspecified: Secondary | ICD-10-CM

## 2024-07-09 DIAGNOSIS — Z1211 Encounter for screening for malignant neoplasm of colon: Secondary | ICD-10-CM

## 2024-07-09 DIAGNOSIS — I25118 Atherosclerotic heart disease of native coronary artery with other forms of angina pectoris: Secondary | ICD-10-CM | POA: Diagnosis not present

## 2024-07-09 DIAGNOSIS — D649 Anemia, unspecified: Secondary | ICD-10-CM | POA: Diagnosis not present

## 2024-07-09 DIAGNOSIS — H401111 Primary open-angle glaucoma, right eye, mild stage: Secondary | ICD-10-CM | POA: Diagnosis not present

## 2024-07-09 LAB — POCT GLYCOSYLATED HEMOGLOBIN (HGB A1C): Hemoglobin A1C: 5.5 % (ref 4.0–5.6)

## 2024-07-09 LAB — GLUCOSE, CAPILLARY: Glucose-Capillary: 92 mg/dL (ref 70–99)

## 2024-07-09 NOTE — Telephone Encounter (Signed)
 SDOH paperwork was given to the patient during her appointment. Patient refused to complete SDOH paperwork.

## 2024-07-09 NOTE — Progress Notes (Signed)
 CC: Follow-up  HPI:  Amy Howell is a 68 y.o. female living with a history stated below and presents today for follow-up. Please see problem based assessment and plan for additional details.  Past Medical History:  Diagnosis Date   Cataracts, bilateral    Cerebral infarction due to carotid artery stenosis (HCC)    Diabetes mellitus type 2 in nonobese (HCC)    Glaucoma    Hypertension    Hypertensive emergency 11/22/2022   Stroke [facility])    no residual, series of mini strokes   Tobacco abuse    Vertigo     Current Outpatient Medications on File Prior to Visit  Medication Sig Dispense Refill   aspirin  81 MG chewable tablet Chew 1 tablet (81 mg total) by mouth daily. 30 tablet 3   brimonidine  (ALPHAGAN ) 0.2 % ophthalmic solution Place 1 drop into both eyes at bedtime.     carvedilol  (COREG ) 12.5 MG tablet Take 1 tablet (12.5 mg total) by mouth 2 (two) times daily with a meal. 60 tablet 11   digoxin  (LANOXIN ) 0.125 MG tablet Take 1 tablet (0.125 mg total) by mouth every Monday, Wednesday, and Friday. 12 tablet 0   dorzolamide  (TRUSOPT ) 2 % ophthalmic solution Instill 1 drop into both eyes twice a day 30 mL 3   dorzolamide  (TRUSOPT ) 2 % ophthalmic solution Place 1 drop into both eyes 2 (two) times daily. 10 mL 6   empagliflozin  (JARDIANCE ) 10 MG TABS tablet Take 1/2 tablet (5 mg) by mouth daily 45 tablet 3   empagliflozin  (JARDIANCE ) 10 MG TABS tablet Take 0.5 tablet (5 mg) by mouth daily 14 tablet 0   feeding supplement (ENSURE ENLIVE / ENSURE PLUS) LIQD Take 237 mLs by mouth 3 (three) times daily between meals. (Patient not taking: Reported on 01/08/2024) 237 mL 12   [Paused] hydrochlorothiazide  (HYDRODIURIL ) 12.5 MG tablet Take 12.5 mg by mouth daily. (Patient not taking: Reported on 12/24/2023)     latanoprost  (XALATAN ) 0.005 % ophthalmic solution Instill 1 drop into both eyes at bedtime 22.5 mL 3   mirtazapine  (REMERON  SOL-TAB) 15 MG disintegrating tablet Take 7.5 mg by mouth  at bedtime. (Patient not taking: Reported on 12/24/2023)     Multiple Vitamin (MULTIVITAMIN WITH MINERALS) TABS tablet Take 1 tablet by mouth daily. (Patient not taking: Reported on 01/08/2024) 30 tablet 0   [Paused] spironolactone  (ALDACTONE ) 25 MG tablet Take 1/2 tablet (12.5 mg total) by mouth daily. (Patient not taking: Reported on 12/24/2023) 30 tablet 11   ticagrelor  (BRILINTA ) 90 MG TABS tablet Take 1 tablet (90 mg total) by mouth 2 (two) times daily. 60 tablet 11   timolol  (TIMOPTIC ) 0.5 % ophthalmic solution Place 1 drop into both eyes every morning. 45 mL 3   timolol  (TIMOPTIC ) 0.5 % ophthalmic solution Place 1 drop into both eyes every morning. 45 mL 3   torsemide  (DEMADEX ) 20 MG tablet Take 1 tablet (20 mg total) by mouth daily. 30 tablet 2   No current facility-administered medications on file prior to visit.    Family History  Problem Relation Age of Onset   Dementia Mother    Kidney disease Father    Hypertension Father    Diabetes Father    Heart disease Father    Lung cancer Maternal Aunt        dx after 38   Prostate cancer Other        MGM's brother   Breast cancer Neg Hx    Colon cancer Neg Hx  Liver disease Neg Hx    Esophageal cancer Neg Hx     Social History   Socioeconomic History   Marital status: Single    Spouse name: Not on file   Number of children: 2   Years of education: Not on file   Highest education level: 10th grade  Occupational History   Occupation: retired  Tobacco Use   Smoking status: Former    Current packs/day: 0.50    Average packs/day: 0.5 packs/day for 56.0 years (28.0 ttl pk-yrs)    Types: Cigarettes   Smokeless tobacco: Never  Vaping Use   Vaping status: Never Used  Substance and Sexual Activity   Alcohol use: No   Drug use: No   Sexual activity: Not Currently    Birth control/protection: Surgical    Comment: Hyst  Other Topics Concern   Not on file  Social History Narrative   Not on file   Social Drivers of Health    Financial Resource Strain: Low Risk  (09/17/2023)   Overall Financial Resource Strain (CARDIA)    Difficulty of Paying Living Expenses: Not hard at all  Food Insecurity: No Food Insecurity (12/24/2023)   Hunger Vital Sign    Worried About Running Out of Food in the Last Year: Never true    Ran Out of Food in the Last Year: Never true  Transportation Needs: No Transportation Needs (12/24/2023)   PRAPARE - Administrator, Civil Service (Medical): No    Lack of Transportation (Non-Medical): No  Physical Activity: Insufficiently Active (04/08/2023)   Exercise Vital Sign    Days of Exercise per Week: 3 days    Minutes of Exercise per Session: 30 min  Stress: No Stress Concern Present (04/08/2023)   [employer] of Occupational Health - Occupational Stress Questionnaire    Feeling of Stress : Not at all  Social Connections: Socially Isolated (12/24/2023)   Social Connection and Isolation Panel    Frequency of Communication with Friends and Family: More than three times a week    Frequency of Social Gatherings with Friends and Family: More than three times a week    Attends Religious Services: Never    Database administrator or Organizations: No    Attends Banker Meetings: Never    Marital Status: Never married  Intimate Partner Violence: Not At Risk (12/24/2023)   Humiliation, Afraid, Rape, and Kick questionnaire    Fear of Current or Ex-Partner: No    Emotionally Abused: No    Physically Abused: No    Sexually Abused: No    Review of Systems: ROS negative except for what is noted on the assessment and plan.  Vitals:   07/09/24 1036 07/09/24 1050  BP: (!) 179/85 (!) 171/84  Pulse: 83 72  Temp: (!) 97.4 F (36.3 C)   TempSrc: Oral   SpO2: 94%   Weight: 75 lb (34 kg)   Height: 5' (1.524 m)     Physical Exam: Constitutional: frail-appearing, sitting in chair, in no acute distress Cardiovascular: regular rate and rhythm, no m/r/g, no  LEE Pulmonary/Chest: normal work of breathing on room air, lungs clear to auscultation bilaterally MSK: frail Skin: warm and dry  Assessment & Plan:     Patient discussed with Dr. Jeanelle   Hypertension BP is 179/85 and 171/84. Does not check BP regularly at home. Managed with Coreg  12.5 mg BID. Occasionally misses dose of Coreg  but generally adherent. BP has not been adequately controlled per chart  review. Unfortunately, medication adherence and overall healthcare recommendations have been precluded by some distrust in the medical system and by side effects such as chest pain and SOB which she attributes to medications.  I spent a lot of time with her today explaining the importance of medications and how these side effects are more likely a result of her chronic conditions not being well-controlled. See plan for HF. Will check some labs today and formulate plan for HTN and all chronic conditions accordingly. Unfortunately, it seems that she will be unwilling to take all medications she should be taking, but I have done my best to risk stratify and use shared decision making to have a set of medications that are most important though still inadequate at this point. She seems to be coming around a little on this issue after extensive conversation and rapport building. She will return next week for follow-up and will bring BP log.  Chronic heart failure with reduced ejection fraction (HFrEF, <= 40%) (HCC) As measured on 12/2023 TTE during admission for HF exacerbation with EF of 35-40%.  As per above, GDMT has been limited.  She has been prescribed Jardiance  though she adamantly states she will no longer take this due to side effects.  Also prescribed spironolactone  which she has not been taking.  Has not started on Entresto and takes Coreg  per above.  Clearly she is not on GDMT, and I believe many of her side effects are a result of this.  After our discussion today, I believe she is more open to  getting back on board with GDMT with the exception of Jardiance .  I will continue to work on getting her back on that as with all other medications.  Will check BMP today to assess kidney function and ability to tolerate GDMT.  If kidney function allows, plan to switch Coreg  to Toprol -XL, start Entresto, and resume spironolactone .  Will continue to discuss Jardiance .  Diabetes mellitus type 2 in nonobese (HCC) A1c today is 5.5.  Will check BMP and microalbumin: creatinine.  Unintentional weight loss Check CBC and I have also ordered FIT test for today.  She has been prescribed mirtazapine  in the past but is no longer taking it.  Does take Ensure shakes occasionally.  Will continue to work on this at follow-up next week.  Chronic anemia Check CBC and fit test today.  She denies melena/hematochezia.  CAD (coronary artery disease) Drug-eluting stent to the mid RCA in 2023.  She remains on Brilinta , aspirin . Endorses occasional chest pain and SOB but none currently.      Norman Lobstein, D.O. [facility] Internal Medicine, PGY-2 Phone: [phone] Date 07/11/2024 Time 9:57 AM

## 2024-07-10 LAB — BASIC METABOLIC PANEL WITH GFR
BUN/Creatinine Ratio: 20 (ref 12–28)
BUN: 21 mg/dL (ref 8–27)
CO2: 23 mmol/L (ref 20–29)
Calcium: 9.2 mg/dL (ref 8.7–10.3)
Chloride: 101 mmol/L (ref 96–106)
Creatinine, Ser: 1.06 mg/dL — ABNORMAL HIGH (ref 0.57–1.00)
Glucose: 87 mg/dL (ref 70–99)
Potassium: 4.7 mmol/L (ref 3.5–5.2)
Sodium: 136 mmol/L (ref 134–144)
eGFR: 57 mL/min/1.73 — ABNORMAL LOW (ref >59)

## 2024-07-10 LAB — CBC
Hematocrit: 36 % (ref 34.0–46.6)
Hemoglobin: 10.9 g/dL — ABNORMAL LOW (ref 11.1–15.9)
MCH: 29.9 pg (ref 26.6–33.0)
MCHC: 30.3 g/dL — ABNORMAL LOW (ref 31.5–35.7)
MCV: 99 fL — ABNORMAL HIGH (ref 79–97)
Platelets: 249 x10E3/uL (ref 150–450)
RBC: 3.65 x10E6/uL — ABNORMAL LOW (ref 3.77–5.28)
RDW: 13 % (ref 11.7–15.4)
WBC: 5.6 x10E3/uL (ref 3.4–10.8)

## 2024-07-11 DIAGNOSIS — I2510 Atherosclerotic heart disease of native coronary artery without angina pectoris: Secondary | ICD-10-CM | POA: Insufficient documentation

## 2024-07-11 HISTORY — DX: Atherosclerotic heart disease of native coronary artery without angina pectoris: I25.10

## 2024-07-11 LAB — MICROALBUMIN / CREATININE URINE RATIO
Creatinine, Urine: 205.8 mg/dL
Microalb/Creat Ratio: 175 mg/g{creat} — ABNORMAL HIGH (ref 0–29)
Microalbumin, Urine: 360.8 ug/mL

## 2024-07-11 NOTE — Assessment & Plan Note (Addendum)
 As measured on 12/2023 TTE during admission for HF exacerbation with EF of 35-40%.  As per above, GDMT has been limited.  She has been prescribed Jardiance  though she adamantly states she will no longer take this due to side effects.  Also prescribed spironolactone  which she has not been taking.  Has not started on Entresto and takes Coreg  per above.  Clearly she is not on GDMT, and I believe many of her side effects are a result of this.  After our discussion today, I believe she is more open to getting back on board with GDMT with the exception of Jardiance .  I will continue to work on getting her back on that as with all other medications.  Will check BMP today to assess kidney function and ability to tolerate GDMT.  If kidney function allows, plan to switch Coreg  to Toprol -XL, start Entresto, and resume spironolactone .  Will continue to discuss Jardiance .

## 2024-07-11 NOTE — Assessment & Plan Note (Signed)
 Drug-eluting stent to the mid RCA in 2023.  She remains on Brilinta , aspirin . Endorses occasional chest pain and SOB but none currently.

## 2024-07-11 NOTE — Assessment & Plan Note (Signed)
 Check CBC and fit test today.  She denies melena/hematochezia.

## 2024-07-11 NOTE — Assessment & Plan Note (Signed)
 Check CBC and I have also ordered FIT test for today.  She has been prescribed mirtazapine  in the past but is no longer taking it.  Does take Ensure shakes occasionally.  Will continue to work on this at follow-up next week.

## 2024-07-11 NOTE — Assessment & Plan Note (Signed)
 A1c today is 5.5.  Will check BMP and microalbumin: creatinine.

## 2024-07-11 NOTE — Assessment & Plan Note (Signed)
>>  ASSESSMENT AND PLAN FOR DIABETES MELLITUS TYPE 2 IN NONOBESE (HCC) WRITTEN ON 07/11/2024  9:53 AM BY INGOLD, TYLER, DO  A1c today is 5.5.  Will check BMP and microalbumin: creatinine.

## 2024-07-11 NOTE — Assessment & Plan Note (Addendum)
 BP is 179/85 and 171/84. Does not check BP regularly at home. Managed with Coreg  12.5 mg BID. Occasionally misses dose of Coreg  but generally adherent. BP has not been adequately controlled per chart review. Unfortunately, medication adherence and overall healthcare recommendations have been precluded by some distrust in the medical system and by side effects such as chest pain and SOB which Amy Howell attributes to medications.  I spent a lot of time with her today explaining the importance of medications and how these side effects are more likely a result of her chronic conditions not being well-controlled. See plan for HF. Will check some labs today and formulate plan for HTN and all chronic conditions accordingly. Unfortunately, it seems that Amy Howell will be unwilling to take all medications Amy Howell should be taking, but I have done my best to risk stratify and use shared decision making to have a set of medications that are most important though still inadequate at this point. Amy Howell seems to be coming around a little on this issue after extensive conversation and rapport building. Amy Howell will return next week for follow-up and will bring BP log.

## 2024-07-13 NOTE — Progress Notes (Signed)
 Internal Medicine Clinic Attending  Case discussed with the resident at the time of the visit.  We reviewed the resident's history and exam and pertinent patient test results.  I agree with the assessment, diagnosis, and plan of care documented in the resident's note.

## 2024-07-15 ENCOUNTER — Other Ambulatory Visit (HOSPITAL_COMMUNITY): Payer: Self-pay

## 2024-07-15 ENCOUNTER — Ambulatory Visit (INDEPENDENT_AMBULATORY_CARE_PROVIDER_SITE_OTHER): Payer: Medicare (Managed Care) | Admitting: Student

## 2024-07-15 ENCOUNTER — Other Ambulatory Visit: Payer: Self-pay | Admitting: Student

## 2024-07-15 ENCOUNTER — Ambulatory Visit: Payer: Self-pay | Admitting: Student

## 2024-07-15 VITALS — BP 152/74 | HR 86 | Temp 97.5°F | Ht 60.0 in | Wt 78.8 lb

## 2024-07-15 DIAGNOSIS — E43 Unspecified severe protein-calorie malnutrition: Secondary | ICD-10-CM | POA: Diagnosis not present

## 2024-07-15 DIAGNOSIS — Z1211 Encounter for screening for malignant neoplasm of colon: Secondary | ICD-10-CM

## 2024-07-15 DIAGNOSIS — I5022 Chronic systolic (congestive) heart failure: Secondary | ICD-10-CM

## 2024-07-15 DIAGNOSIS — I130 Hypertensive heart and chronic kidney disease with heart failure and stage 1 through stage 4 chronic kidney disease, or unspecified chronic kidney disease: Secondary | ICD-10-CM | POA: Diagnosis not present

## 2024-07-15 DIAGNOSIS — D649 Anemia, unspecified: Secondary | ICD-10-CM

## 2024-07-15 DIAGNOSIS — I159 Secondary hypertension, unspecified: Secondary | ICD-10-CM

## 2024-07-15 DIAGNOSIS — N1831 Chronic kidney disease, stage 3a: Secondary | ICD-10-CM | POA: Diagnosis not present

## 2024-07-15 MED ORDER — MIRTAZAPINE 15 MG PO TBDP
7.5000 mg | ORAL_TABLET | Freq: Every day | ORAL | 0 refills | Status: DC
  Filled 2024-07-15: qty 30, 60d supply, fill #0

## 2024-07-15 MED ORDER — METOPROLOL SUCCINATE ER 25 MG PO TB24
100.0000 mg | ORAL_TABLET | Freq: Every day | ORAL | 0 refills | Status: DC
  Filled 2024-07-15: qty 30, 7d supply, fill #0

## 2024-07-15 MED ORDER — METOPROLOL SUCCINATE ER 25 MG PO TB24
25.0000 mg | ORAL_TABLET | Freq: Every day | ORAL | 0 refills | Status: DC
  Filled 2024-07-15: qty 30, 30d supply, fill #0

## 2024-07-15 MED ORDER — SPIRONOLACTONE 25 MG PO TABS
12.5000 mg | ORAL_TABLET | Freq: Every day | ORAL | 3 refills | Status: DC
  Filled 2024-07-15: qty 30, 60d supply, fill #0

## 2024-07-15 NOTE — Assessment & Plan Note (Signed)
 Plan per above.  She has also taken HCTZ in the past so will consider this as well.

## 2024-07-15 NOTE — Assessment & Plan Note (Signed)
 Resume mirtazapine  and continue Ensure shakes.

## 2024-07-15 NOTE — Assessment & Plan Note (Signed)
 07/09/2024 BMP with GFR of 57 and microalbumin:creatinine showing moderate proteinuria. She declines Jardiance  and will discuss potential ARB next week.

## 2024-07-15 NOTE — Progress Notes (Signed)
 CC: Follow-up  HPI:  Amy Howell is a 68 y.o. female living with a history stated below and presents today for follow-up. Please see problem based assessment and plan for additional details.  Past Medical History:  Diagnosis Date   Cataracts, bilateral    Cerebral infarction due to carotid artery stenosis (HCC)    Diabetes mellitus type 2 in nonobese (HCC)    Glaucoma    Hypertension    Hypertensive emergency 11/22/2022   Stroke [facility])    no residual, series of mini strokes   Tobacco abuse    Vertigo     Current Outpatient Medications on File Prior to Visit  Medication Sig Dispense Refill   aspirin  81 MG chewable tablet Chew 1 tablet (81 mg total) by mouth daily. 30 tablet 3   brimonidine  (ALPHAGAN ) 0.2 % ophthalmic solution Place 1 drop into both eyes at bedtime.     digoxin  (LANOXIN ) 0.125 MG tablet Take 1 tablet (0.125 mg total) by mouth every Monday, Wednesday, and Friday. 12 tablet 0   dorzolamide  (TRUSOPT ) 2 % ophthalmic solution Instill 1 drop into both eyes twice a day 30 mL 3   dorzolamide  (TRUSOPT ) 2 % ophthalmic solution Place 1 drop into both eyes 2 (two) times daily. 10 mL 6   empagliflozin  (JARDIANCE ) 10 MG TABS tablet Take 1/2 tablet (5 mg) by mouth daily 45 tablet 3   empagliflozin  (JARDIANCE ) 10 MG TABS tablet Take 0.5 tablet (5 mg) by mouth daily 14 tablet 0   feeding supplement (ENSURE ENLIVE / ENSURE PLUS) LIQD Take 237 mLs by mouth 3 (three) times daily between meals. (Patient not taking: Reported on 01/08/2024) 237 mL 12   [Paused] hydrochlorothiazide  (HYDRODIURIL ) 12.5 MG tablet Take 12.5 mg by mouth daily. (Patient not taking: Reported on 12/24/2023)     latanoprost  (XALATAN ) 0.005 % ophthalmic solution Instill 1 drop into both eyes at bedtime 22.5 mL 3   Multiple Vitamin (MULTIVITAMIN WITH MINERALS) TABS tablet Take 1 tablet by mouth daily. (Patient not taking: Reported on 01/08/2024) 30 tablet 0   ticagrelor  (BRILINTA ) 90 MG TABS tablet Take 1 tablet  (90 mg total) by mouth 2 (two) times daily. 60 tablet 11   timolol  (TIMOPTIC ) 0.5 % ophthalmic solution Place 1 drop into both eyes every morning. 45 mL 3   timolol  (TIMOPTIC ) 0.5 % ophthalmic solution Place 1 drop into both eyes every morning. 45 mL 3   torsemide  (DEMADEX ) 20 MG tablet Take 1 tablet (20 mg total) by mouth daily. 30 tablet 2   No current facility-administered medications on file prior to visit.    Family History  Problem Relation Age of Onset   Dementia Mother    Kidney disease Father    Hypertension Father    Diabetes Father    Heart disease Father    Lung cancer Maternal Aunt        dx after 44   Prostate cancer Other        MGM's brother   Breast cancer Neg Hx    Colon cancer Neg Hx    Liver disease Neg Hx    Esophageal cancer Neg Hx     Social History   Socioeconomic History   Marital status: Single    Spouse name: Not on file   Number of children: 2   Years of education: Not on file   Highest education level: 10th grade  Occupational History   Occupation: retired  Tobacco Use   Smoking status: Former  Current packs/day: 0.50    Average packs/day: 0.5 packs/day for 56.0 years (28.0 ttl pk-yrs)    Types: Cigarettes   Smokeless tobacco: Never  Vaping Use   Vaping status: Never Used  Substance and Sexual Activity   Alcohol use: No   Drug use: No   Sexual activity: Not Currently    Birth control/protection: Surgical    Comment: Hyst  Other Topics Concern   Not on file  Social History Narrative   Not on file   Social Drivers of Health   Financial Resource Strain: Low Risk  (09/17/2023)   Overall Financial Resource Strain (CARDIA)    Difficulty of Paying Living Expenses: Not hard at all  Food Insecurity: No Food Insecurity (12/24/2023)   Hunger Vital Sign    Worried About Running Out of Food in the Last Year: Never true    Ran Out of Food in the Last Year: Never true  Transportation Needs: No Transportation Needs (12/24/2023)   PRAPARE -  Administrator, Civil Service (Medical): No    Lack of Transportation (Non-Medical): No  Physical Activity: Insufficiently Active (04/08/2023)   Exercise Vital Sign    Days of Exercise per Week: 3 days    Minutes of Exercise per Session: 30 min  Stress: No Stress Concern Present (04/08/2023)   [employer] of Occupational Health - Occupational Stress Questionnaire    Feeling of Stress : Not at all  Social Connections: Socially Isolated (12/24/2023)   Social Connection and Isolation Panel    Frequency of Communication with Friends and Family: More than three times a week    Frequency of Social Gatherings with Friends and Family: More than three times a week    Attends Religious Services: Never    Database administrator or Organizations: No    Attends Banker Meetings: Never    Marital Status: Never married  Intimate Partner Violence: Not At Risk (12/24/2023)   Humiliation, Afraid, Rape, and Kick questionnaire    Fear of Current or Ex-Partner: No    Emotionally Abused: No    Physically Abused: No    Sexually Abused: No    Review of Systems: ROS negative except for what is noted on the assessment and plan.  Vitals:   07/15/24 0943 07/15/24 0947  BP: (!) 159/76 (!) 152/74  Pulse: 68 86  Temp: (!) 97.5 F (36.4 C)   TempSrc: Oral   SpO2: 98%   Weight: 78 lb 12.8 oz (35.7 kg)   Height: 5' (1.524 m)     Physical Exam: Constitutional: frail-appearing, sitting in chair, in no acute distress Cardiovascular: regular rate and rhythm, no m/r/g, no LEE, no JVD Pulmonary/Chest: normal work of breathing on room air, lungs clear to auscultation bilaterally MSK: frail Skin: warm and dry  Assessment & Plan:   Patient discussed with Dr. Jeanelle  Chronic heart failure with reduced ejection fraction (HFrEF, <= 40%) (HCC) Here for follow-up after being seen last week.  Plan at that time wants to get labs to ensure kidney function was good enough to start GDMT.   Labs reassuring.  Will start Toprol -XL 25 mg daily, resume previously prescribed Aldactone  12.5 mg daily. I have discontinued Coreg . She had angioedema after lisinopril  in the past so no Entresto.  She is adamantly declining Jardiance  as she believes this was contributing to shortness of breath/chest pain in the past.  Despite multiple attempts at counseling she refuses to start Jardiance .  She will record ambulatory BP  and HR and return for follow-up next week.  Based on these readings, will consider up titration of GDMT as BP and HR allow. Also consider adding ARB.   Hypertension Plan per above.  She has also taken HCTZ in the past so will consider this as well.  Severe protein-calorie malnutrition (HCC) Resume mirtazapine  and continue Ensure shakes.  Chronic anemia CBC last week showed Hgb 10.9 which is at or better than baseline.  She does have a history of colon polyps but is not a current candidate for colonoscopy. She brought back the FIT test today. Will await those results and plan accordingly.   CKD stage 3a, GFR 45-59 ml/min (HCC) 07/09/2024 BMP with GFR of 57 and microalbumin:creatinine showing moderate proteinuria. She declines Jardiance  and will discuss potential ARB next week.   Norman Lobstein, D.O. [facility] Internal Medicine, PGY-2 Phone: [phone] Date 07/15/2024 Time 1:20 PM

## 2024-07-15 NOTE — Patient Instructions (Signed)
 STOP taking Carvedilol . Follow-up with BP measurements and HR next week.

## 2024-07-15 NOTE — Assessment & Plan Note (Signed)
 Here for follow-up after being seen last week.  Plan at that time wants to get labs to ensure kidney function was good enough to start GDMT.  Labs reassuring.  Will start Toprol -XL 25 mg daily, resume previously prescribed Aldactone  12.5 mg daily. I have discontinued Coreg . She had angioedema after lisinopril  in the past so no Entresto.  She is adamantly declining Jardiance  as she believes this was contributing to shortness of breath/chest pain in the past.  Despite multiple attempts at counseling she refuses to start Jardiance .  She will record ambulatory BP and HR and return for follow-up next week.  Based on these readings, will consider up titration of GDMT as BP and HR allow. Also consider adding ARB.

## 2024-07-15 NOTE — Assessment & Plan Note (Signed)
 CBC last week showed Hgb 10.9 which is at or better than baseline.  She does have a history of colon polyps but is not a current candidate for colonoscopy. She brought back the FIT test today. Will await those results and plan accordingly.

## 2024-07-16 NOTE — Progress Notes (Signed)
 Internal Medicine Clinic Attending  Case discussed with the resident at the time of the visit.  We reviewed the resident's history and exam and pertinent patient test results.  I agree with the assessment, diagnosis, and plan of care documented in the resident's note.

## 2024-07-17 LAB — FECAL OCCULT BLOOD, IMMUNOCHEMICAL: Fecal Occult Bld: NEGATIVE

## 2024-07-22 ENCOUNTER — Encounter: Payer: Medicare (Managed Care) | Admitting: Student

## 2024-07-26 ENCOUNTER — Other Ambulatory Visit (HOSPITAL_COMMUNITY): Payer: Self-pay

## 2024-08-10 ENCOUNTER — Ambulatory Visit: Payer: Medicare (Managed Care) | Admitting: Internal Medicine

## 2024-08-10 ENCOUNTER — Encounter: Payer: Self-pay | Admitting: Internal Medicine

## 2024-08-10 VITALS — BP 129/64 | HR 98 | Temp 98.3°F | Ht 60.0 in | Wt 74.4 lb

## 2024-08-10 DIAGNOSIS — I10 Essential (primary) hypertension: Secondary | ICD-10-CM

## 2024-08-10 DIAGNOSIS — I351 Nonrheumatic aortic (valve) insufficiency: Secondary | ICD-10-CM | POA: Diagnosis not present

## 2024-08-10 DIAGNOSIS — R911 Solitary pulmonary nodule: Secondary | ICD-10-CM

## 2024-08-10 DIAGNOSIS — I5022 Chronic systolic (congestive) heart failure: Secondary | ICD-10-CM | POA: Diagnosis not present

## 2024-08-10 DIAGNOSIS — R7303 Prediabetes: Secondary | ICD-10-CM

## 2024-08-10 DIAGNOSIS — I340 Nonrheumatic mitral (valve) insufficiency: Secondary | ICD-10-CM | POA: Diagnosis not present

## 2024-08-10 DIAGNOSIS — I110 Hypertensive heart disease with heart failure: Secondary | ICD-10-CM | POA: Diagnosis not present

## 2024-08-10 DIAGNOSIS — R1319 Other dysphagia: Secondary | ICD-10-CM

## 2024-08-10 DIAGNOSIS — I071 Rheumatic tricuspid insufficiency: Secondary | ICD-10-CM | POA: Diagnosis not present

## 2024-08-10 DIAGNOSIS — I272 Pulmonary hypertension, unspecified: Secondary | ICD-10-CM | POA: Diagnosis not present

## 2024-08-10 NOTE — Patient Instructions (Signed)
 Please take your half tablet of torsemide  three times/week until you see the fluid improve. Call if it does not improve. Please focus on high protein, high calorie meals that are low in salt.

## 2024-08-10 NOTE — Progress Notes (Signed)
 Comprehensive Geriatric Assessment - Initial Consultation  PCP: Marylu Gee, DO  Referring Provider: Marylu Gee, DO Reason for Consultation: Goals of Care; cognitive screening   Amy Howell is a 68 year old female with unintended weight loss, HFrEF (EF 35-40% 12/2023), severe pulmonary hypertension, severe mitral regurgitation, moderate tricuspid regurgitation, moderate aortic regurgitation, CAD s/p RCA stent (on Brilinta , ASA), history of invasive lobular carcinoma of breast s/p resection (declined recommended chemo), normocytic anemia, CKD 3A, hypertension, intermittent esophageal dysphagia (mild dysmotility on 11/2023 barium esophogram, no high-grade stricture/stenosis), and medication non-adherence due to self-presumed intolerance (multiple intolerances reported) who presents for a goals of care discussion specifically in regards to her weight loss and increasing dyspnea. Since being seen in clinic on 07/15/24, both LE swelling and SOB have increased to where, as of last week, she is now using a rented wheel chair when needing to ambulate 25+ feet. On 07/15/24, Toprol -XL was started per GDMT purposed (in exchange for less-recommended Coreg ), but she stopped the Toprol  after one day as she believed it caused her significant SOB/lethargy. In terms of other GDMT, she has adamantly declined both Jardiance  and Aldactone  due to similar side effects and she has a history of angioedema with ACEI precluding Entresto. As LE swelling and SOB worsened, she self-resumed Torsemide  (10 mg) yesterday with plan to take MWF (prescribed 20 mg daily, but she has not been taking this recently). She is also endorsing continued weight loss and that Mirtazapine  (7.5 mg at bedtime intermittently over past year) did not help with appetite.   Current Medications: Torsemide  10 mg MWF (recently resumed on her own) Carvedilol  12.5 one morning, 1/2 tablet (6.75 mg) evening (self resumed when she decided Toprol  was causing  problems) ASA 81 mg daily Brilinta  90 mg BID  Patient Active Problem List   Diagnosis Date Noted   Presence of stent in coronary artery in patient with coronary artery disease 07/11/2024   Chronic anemia 12/04/2023   Esophageal dysphagia, intermittent 12/01/2023   Antiplatelet or antithrombotic long-term use 12/01/2023   Constipation 11/21/2023   Medication nonadherence due to intolerance 11/21/2023   Ischemic cardiomyopathy 09/17/2023   History of stroke 09/15/2023   Severe protein-calorie malnutrition (HCC) 09/13/2023   Underweight 09/13/2023   Lung nodule, 5mm upper lobe, incidental 2024 on CTA 09/12/2023   CKD stage 3a, GFR 45-59 ml/min (HCC) 09/12/2023   Hyperlipidemia 04/09/2023   Chronic heart failure with reduced ejection fraction (HFrEF, <= 40%) (HCC) 11/22/2022   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  Completed 10th grade; dropped out due to her first pregnancy.  Dependent in IADLs, though doesn't like to admit it Independent in all ADL's though has recently been getting help with washing hair 2/2 DOE  Geriatric ROS Appetite/Weight: Eats 2-3 meals a day, typically: Eggs, Tuna, vegetable soup, oatmeal crackers Elimination:  Does not endorse urinary incontince, and no problems with bowel.  She does wear depends pullups for potential for accidents related to diuretic  (she doesn't  move quickly; this is more of a functional incontinence) Mobility & Falls: Not walking much,  particularly lately due to DOE.  Walks with a walker, and now a WC (privately purchased) to enable distance while SOB. No falls.   Sensory: She has functional vision, no complaints.  Dx Glaucoma.  Wears corrective lenses.  No problems with hearing. Pain: No pain of joints, back, head, abdomen. Sleep: Sleeps in recliner to elevate head recently due to orthopnea Mood: Irritability, no depressive thoughts/anxiety endorsed by patient.  Physical Exam BP 129/64 (BP Location: Left Arm, Patient Position: Sitting, Cuff Size: Small)   Pulse 98   Temp 98.3 F (36.8 C) (Oral)   Ht 5' (1.524 m)   Wt 74 lb 6.4 oz (33.7 kg)   SpO2 98%   BMI 14.53 kg/m   Constitutional: frail-appearing, looks underweight,sitting in wheelchair, in no acute distress HENT: normocephalic atraumatic, mucous membranes moist Eyes: conjunctiva non-erythematous Cardiovascular: regular rate and rhythm, no m/r/g, no JVD, 1+ bilateral LEE from feet to mid shin Pulmonary/Chest: normal work of breathing on room air, mildly decreased air movement on right, mild right basilar crackles Abdominal: soft, non-tender, non-distended MSK: cachectic, 5/5 strength throughout Neurological: alert & oriented x 4, no focal deficit Skin: warm and dry, mildly decreased skin turgor, no overt lesions, ecchymoses Psych: normal mood and behavior   Cognitive Assessment: MoCA - patient declined I don't have a problem with that  Problems addressed today:  Heart Failure with Reduced Ejection Fraction (EF 35-40% 12/2023) Severe Pulmonary Hypertension  Severe mitral regurgitation Moderate tricuspid regurgitation Moderate aortic regurgitation Currently managed with Coreg  12.5 mg AM and 6.75 mg PM and she began taking Torsemide  10 mg 9/8 with plan to take it MWF - it is prescribed 20 mg daily but she has not taken this in months. She is adamantly declining any additional GDMT as she believes Toprol -XL and Jardiance  caused SOB/lethargy. Previous  angioedema on ACE-I has precluded Entresto. I do not believe she fully understands her diagnosis and I have spent the last three visits explaining the objective findings on TTE and how these relate to her symptoms. Unfortunately, she believes her lack of angina means that her HF is not as bad as we are describing. I had an extensive conversations with her, Rutherford, and Francis about overall prognosis and goals of care which are outlined below. At this present time, we will focus on symptom optimization with the limited therapy above. Will check BMP today.         No follow-ups on file.

## 2024-08-10 NOTE — Progress Notes (Deleted)
 Comprehensive Geriatric Assessment - {jw [id number]} Consultation  PCP: Marylu Gee, DO  Referring Provider: *** Reason for Consultation: ***  ***  Patient Active Problem List   Diagnosis Date Noted   Presence of stent in coronary artery in patient with coronary artery disease 07/11/2024   Chronic anemia 12/04/2023   Esophageal dysphagia, intermittent 12/01/2023   Antiplatelet or antithrombotic long-term use 12/01/2023   Constipation 11/21/2023   Medication nonadherence due to intolerance 11/21/2023   Ischemic cardiomyopathy 09/17/2023   History of stroke 09/15/2023   Severe protein-calorie malnutrition 09/13/2023   Underweight 09/13/2023   Lung nodule, 5mm upper lobe, incidental 2024 on CTA 09/12/2023   CKD stage 3a, GFR 45-59 ml/min (HCC) 09/12/2023   Hyperlipidemia 04/09/2023   Chronic heart failure with reduced ejection fraction (HFrEF, <= 40%) (HCC) 11/22/2022   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, right (HCC) 06/10/2022   History of smoking 03/30/2018   Glaucoma of both eyes 03/30/2018   History of loop recorder 03/30/2018   Prediabetes 04/10/2017   Unintentional weight loss    Basilar artery stenosis    Hypertension 03/26/2017    Current Outpatient Medications:    aspirin  81 MG chewable tablet, Chew 1 tablet (81 mg total) by mouth daily., Disp: 30 tablet, Rfl: 3   brimonidine  (ALPHAGAN ) 0.2 % ophthalmic solution, Place 1 drop into both eyes at bedtime., Disp: , Rfl:    dorzolamide  (TRUSOPT ) 2 % ophthalmic solution, Place 1 drop into both eyes 2 (two) times daily., Disp: 10 mL, Rfl: 6   feeding supplement (ENSURE ENLIVE / ENSURE PLUS) LIQD, Take 237 mLs by mouth 3 (three) times daily between meals. (Patient not taking: Reported on 01/08/2024), Disp: 237 mL, Rfl: 12   latanoprost  (XALATAN ) 0.005 % ophthalmic solution, Instill 1 drop into both eyes at bedtime, Disp: 22.5 mL, Rfl: 3   mirtazapine  (REMERON  SOL-TAB) 15 MG disintegrating  tablet, Dissolve 1/2 tablets (7.5 mg total) by mouth at bedtime., Disp: 30 tablet, Rfl: 0   Multiple Vitamin (MULTIVITAMIN WITH MINERALS) TABS tablet, Take 1 tablet by mouth daily. (Patient not taking: Reported on 01/08/2024), Disp: 30 tablet, Rfl: 0   ticagrelor  (BRILINTA ) 90 MG TABS tablet, Take 1 tablet (90 mg total) by mouth 2 (two) times daily., Disp: 60 tablet, Rfl: 11   timolol  (TIMOPTIC ) 0.5 % ophthalmic solution, Place 1 drop into both eyes every morning., Disp: 45 mL, Rfl: 3   timolol  (TIMOPTIC ) 0.5 % ophthalmic solution, Place 1 drop into both eyes every morning., Disp: 45 mL, Rfl: 3   torsemide  (DEMADEX ) 20 MG tablet, Take 1 tablet (20 mg total) by mouth daily., Disp: 30 tablet, Rfl: 2  Social: ***  ADLs/IADLs: ***  Geriatric ROS Appetite/Weight: *** Elimination: *** Mobility & Falls: *** Sensory: *** Pain: *** Sleep: *** Mood: ***  Physical Exam BP 129/64 (BP Location: Left Arm, Patient Position: Sitting, Cuff Size: Small)   Pulse 98   Temp 98.3 F (36.8 C) (Oral)   Ht 5' (1.524 m)   Wt 74 lb 6.4 oz (33.7 kg)   SpO2 98%   BMI 14.53 kg/m  ***  Physical Assessment: ***  Cognitive Assessment: *** {Cognitive:31568} {Show previous labs (optional):23779}  Assessment and Plan Lung nodule  Esophageal dysphagia, intermittent  Prediabetes  Primary hypertension -     Basic metabolic panel with GFR  Chronic heart failure with reduced ejection fraction (HFrEF, <= 40%) (HCC) -     Basic metabolic panel with GFR     Return  in about 4 weeks (around 09/07/2024) for Follow-up and 2 months with Dr. Marylu.  Comprehensive Geriatric Assessment - Initial Consultation  PCP: Marylu Gee, DO  Referring Provider: *** Reason for Consultation: ***  ***  Patient Active Problem List   Diagnosis Date Noted   Presence of stent in coronary artery in patient with coronary artery disease 07/11/2024   Chronic anemia 12/04/2023   Esophageal dysphagia, intermittent 12/01/2023    Antiplatelet or antithrombotic long-term use 12/01/2023   Constipation 11/21/2023   Medication nonadherence due to intolerance 11/21/2023   Ischemic cardiomyopathy 09/17/2023   History of stroke 09/15/2023   Severe protein-calorie malnutrition (HCC) 09/13/2023   Underweight 09/13/2023   Lung nodule, 5mm upper lobe, incidental 2024 on CTA 09/12/2023   CKD stage 3a, GFR 45-59 ml/min (HCC) 09/12/2023   Hyperlipidemia 04/09/2023   Chronic heart failure with reduced ejection fraction (HFrEF, <= 40%) (HCC) 11/22/2022   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, right (HCC) 06/10/2022   History of smoking 03/30/2018   Glaucoma of both eyes 03/30/2018   History of loop recorder 03/30/2018   Prediabetes 04/10/2017   Aneurysm of middle cerebral artery    Unintentional weight loss    Basilar artery stenosis    Hypertension 03/26/2017    Current Outpatient Medications:    aspirin  81 MG chewable tablet, Chew 1 tablet (81 mg total) by mouth daily., Disp: 30 tablet, Rfl: 3   brimonidine  (ALPHAGAN ) 0.2 % ophthalmic solution, Place 1 drop into both eyes at bedtime., Disp: , Rfl:    digoxin  (LANOXIN ) 0.125 MG tablet, Take 1 tablet (0.125 mg total) by mouth every Monday, Wednesday, and Friday., Disp: 12 tablet, Rfl: 0   dorzolamide  (TRUSOPT ) 2 % ophthalmic solution, Instill 1 drop into both eyes twice a day, Disp: 30 mL, Rfl: 3   dorzolamide  (TRUSOPT ) 2 % ophthalmic solution, Place 1 drop into both eyes 2 (two) times daily., Disp: 10 mL, Rfl: 6   empagliflozin  (JARDIANCE ) 10 MG TABS tablet, Take 1/2 tablet (5 mg) by mouth daily, Disp: 45 tablet, Rfl: 3   empagliflozin  (JARDIANCE ) 10 MG TABS tablet, Take 0.5 tablet (5 mg) by mouth daily, Disp: 14 tablet, Rfl: 0   feeding supplement (ENSURE ENLIVE / ENSURE PLUS) LIQD, Take 237 mLs by mouth 3 (three) times daily between meals. (Patient not taking: Reported on 01/08/2024), Disp: 237 mL, Rfl: 12   [Paused] hydrochlorothiazide   (HYDRODIURIL ) 12.5 MG tablet, Take 12.5 mg by mouth daily. (Patient not taking: Reported on 12/24/2023), Disp: , Rfl:    latanoprost  (XALATAN ) 0.005 % ophthalmic solution, Instill 1 drop into both eyes at bedtime, Disp: 22.5 mL, Rfl: 3   metoprolol  succinate (TOPROL -XL) 25 MG 24 hr tablet, Take 1 tablet (25 mg total) by mouth daily. Take with or immediately following a meal., Disp: 30 tablet, Rfl: 0   mirtazapine  (REMERON  SOL-TAB) 15 MG disintegrating tablet, Dissolve 1/2 tablets (7.5 mg total) by mouth at bedtime., Disp: 30 tablet, Rfl: 0   Multiple Vitamin (MULTIVITAMIN WITH MINERALS) TABS tablet, Take 1 tablet by mouth daily. (Patient not taking: Reported on 01/08/2024), Disp: 30 tablet, Rfl: 0   spironolactone  (ALDACTONE ) 25 MG tablet, Take 1/2 tablets (12.5 mg total) by mouth daily., Disp: 90 tablet, Rfl: 3   ticagrelor  (BRILINTA ) 90 MG TABS tablet, Take 1 tablet (90 mg total) by mouth 2 (two) times daily., Disp: 60 tablet, Rfl: 11   timolol  (TIMOPTIC ) 0.5 % ophthalmic solution, Place 1 drop into both eyes every morning., Disp: 45  mL, Rfl: 3   timolol  (TIMOPTIC ) 0.5 % ophthalmic solution, Place 1 drop into both eyes every morning., Disp: 45 mL, Rfl: 3   torsemide  (DEMADEX ) 20 MG tablet, Take 1 tablet (20 mg total) by mouth daily., Disp: 30 tablet, Rfl: 2  Social: ***  ADLs/IADLs: ***  Geriatric ROS Appetite/Weight: *** Elimination: *** Mobility & Falls: *** Sensory: *** Pain: *** Sleep: *** Mood: ***  Physical Exam BP 129/64 (BP Location: Left Arm, Patient Position: Sitting, Cuff Size: Small)   Pulse 98   Temp 98.3 F (36.8 C) (Oral)   Ht 5' (1.524 m)   Wt 74 lb 6.4 oz (33.7 kg)   SpO2 98%   BMI 14.53 kg/m  ***  Physical Assessment: ***  Cognitive Assessment: *** {Cognitive:31568} {Show previous labs (optional):23779}  Assessment and Plan    No follow-ups on file.

## 2024-08-11 ENCOUNTER — Other Ambulatory Visit: Payer: Self-pay

## 2024-08-11 LAB — BASIC METABOLIC PANEL WITH GFR
BUN/Creatinine Ratio: 19 (ref 12–28)
BUN: 21 mg/dL (ref 8–27)
CO2: 22 mmol/L (ref 20–29)
Calcium: 8.6 mg/dL — ABNORMAL LOW (ref 8.7–10.3)
Chloride: 94 mmol/L — ABNORMAL LOW (ref 96–106)
Creatinine, Ser: 1.13 mg/dL — ABNORMAL HIGH (ref 0.57–1.00)
Glucose: 80 mg/dL (ref 70–99)
Potassium: 4.9 mmol/L (ref 3.5–5.2)
Sodium: 131 mmol/L — ABNORMAL LOW (ref 134–144)
eGFR: 52 mL/min/1.73 — ABNORMAL LOW (ref >59)

## 2024-08-13 ENCOUNTER — Other Ambulatory Visit: Payer: Self-pay

## 2024-08-31 ENCOUNTER — Other Ambulatory Visit: Payer: Self-pay | Admitting: Student

## 2024-08-31 ENCOUNTER — Encounter (HOSPITAL_COMMUNITY): Payer: Self-pay

## 2024-08-31 ENCOUNTER — Other Ambulatory Visit (HOSPITAL_COMMUNITY): Payer: Self-pay

## 2024-08-31 ENCOUNTER — Other Ambulatory Visit (HOSPITAL_BASED_OUTPATIENT_CLINIC_OR_DEPARTMENT_OTHER): Payer: Self-pay

## 2024-09-01 ENCOUNTER — Other Ambulatory Visit (HOSPITAL_COMMUNITY): Payer: Self-pay

## 2024-09-01 ENCOUNTER — Other Ambulatory Visit: Payer: Self-pay | Admitting: Student

## 2024-09-01 MED ORDER — CARVEDILOL 12.5 MG PO TABS
12.5000 mg | ORAL_TABLET | Freq: Two times a day (BID) | ORAL | 3 refills | Status: AC
  Filled 2024-09-01: qty 60, 30d supply, fill #0
  Filled 2024-10-11: qty 60, 30d supply, fill #1
  Filled 2024-11-24: qty 60, 30d supply, fill #2
  Filled 2024-12-29: qty 60, 30d supply, fill #3

## 2024-09-03 ENCOUNTER — Other Ambulatory Visit (HOSPITAL_COMMUNITY): Payer: Self-pay

## 2024-09-13 ENCOUNTER — Other Ambulatory Visit (HOSPITAL_COMMUNITY): Payer: Self-pay

## 2024-09-13 DIAGNOSIS — H401111 Primary open-angle glaucoma, right eye, mild stage: Secondary | ICD-10-CM | POA: Diagnosis not present

## 2024-09-13 DIAGNOSIS — H401123 Primary open-angle glaucoma, left eye, severe stage: Secondary | ICD-10-CM | POA: Diagnosis not present

## 2024-09-13 MED ORDER — TIMOLOL MALEATE 0.5 % OP SOLN
1.0000 [drp] | Freq: Two times a day (BID) | OPHTHALMIC | 7 refills | Status: AC
  Filled 2024-09-14: qty 10, 50d supply, fill #0

## 2024-09-14 ENCOUNTER — Other Ambulatory Visit (HOSPITAL_COMMUNITY): Payer: Self-pay

## 2024-09-14 ENCOUNTER — Ambulatory Visit (INDEPENDENT_AMBULATORY_CARE_PROVIDER_SITE_OTHER): Payer: Medicare (Managed Care)

## 2024-09-14 VITALS — BP 153/68 | HR 87 | Temp 98.0°F | Ht 60.0 in | Wt 74.0 lb

## 2024-09-14 DIAGNOSIS — Z681 Body mass index (BMI) 19 or less, adult: Secondary | ICD-10-CM

## 2024-09-14 DIAGNOSIS — I5022 Chronic systolic (congestive) heart failure: Secondary | ICD-10-CM

## 2024-09-14 DIAGNOSIS — Z8249 Family history of ischemic heart disease and other diseases of the circulatory system: Secondary | ICD-10-CM

## 2024-09-14 DIAGNOSIS — R636 Underweight: Secondary | ICD-10-CM | POA: Diagnosis not present

## 2024-09-14 DIAGNOSIS — Z79899 Other long term (current) drug therapy: Secondary | ICD-10-CM

## 2024-09-14 DIAGNOSIS — N1831 Chronic kidney disease, stage 3a: Secondary | ICD-10-CM

## 2024-09-14 DIAGNOSIS — Z860100 Personal history of colon polyps, unspecified: Secondary | ICD-10-CM

## 2024-09-14 DIAGNOSIS — R911 Solitary pulmonary nodule: Secondary | ICD-10-CM

## 2024-09-14 DIAGNOSIS — Z87891 Personal history of nicotine dependence: Secondary | ICD-10-CM | POA: Diagnosis not present

## 2024-09-14 DIAGNOSIS — K5900 Constipation, unspecified: Secondary | ICD-10-CM | POA: Diagnosis not present

## 2024-09-14 NOTE — Assessment & Plan Note (Signed)
 The patient was recently seen in the office on 9/9 where she was reporting worsening shortness of breath and lower extremity edema.  At this time a long discussion was had with her about her disease process and the reasoning behind her fluid buildup.  She is currently on Coreg  12.5 each morning and 6.25 each night as well as torsemide  10 on Mondays Wednesdays and Fridays.  It seems as if extensive conversations have been had in the past regarding increasing her GDMT however the patient is very resistant to medications as she believes they are making her condition worse.  Over the past week she did increase her torsemide  for up to 20 and has noticed a noticeable improvement in her lower extremity edema.  I did instruct the patient to continue the 20 mg of torsemide  however when her edema resolves to decrease back to 10 mg Monday Wednesday Friday to reduce risk of overdiuresis.  Will check BMP today Orders:   Basic metabolic panel with GFR

## 2024-09-14 NOTE — Assessment & Plan Note (Signed)
 Patient complains today of frequent bowel movements however when probing further, she reports that she often has solid small stool balls and will sometimes have liquid stools but she is still feeling as if she has to push hard to pass them.  We did have a long discussion about encopresis and the likelihood that although she is feeling as if she is likely having liquid stools, she is likely chronically constipated.  We did discuss initiating a bowel regimen including MiraLAX  and senna however the patient is very resistant to be on any more pharmaceutical agents.  We discussed that you can buy both of these over-the-counter and it would be worth attempting if she is still feeling as if she is having frequent loose stools as well as stools that are difficult to pass.

## 2024-09-14 NOTE — Assessment & Plan Note (Signed)
 The patient presents today with concerns for her weight loss.  She states that this has been going on for many years but feels like it has gotten more drastic over the past few months or so.  She is currently 74 pounds with a BMI of 14.45.  She does endorse chronic fatigue.  An incidental 5 mm lung nodule in the right upper lobe was found on CT angio in October 2024.  We will follow-up on this with a noncontrast CT scan as the patient is at high risk of lung malignancy due to decades long history of cigarette smoking. Orders:   CT Chest Wo Contrast; Future

## 2024-09-14 NOTE — Patient Instructions (Signed)
 Thank you, [name], for allowing us  to provide your care today. Today we discussed . . .  > Weight Loss       - We have ordered a CT scan to be done at the imaging center across the street. You will receive a call with the appointment time.  > Colonoscopy       - Call [facility] at [phone] to set up an appointment.  > Heart Failure       - Continue your torsemide , as your swelling improves, decrease it back down to half a tablet MWF   I have ordered the following labs for you:  Lab Orders  No laboratory test(s) ordered today      Referrals ordered today:   Referral Orders  No referral(s) requested today      Follow up: 2 months    Remember:  Should you have any questions or concerns please call the internal medicine clinic at [phone].     Schuyler Novak, DO [facility]

## 2024-09-14 NOTE — Progress Notes (Signed)
 Established Patient Office Visit  Subjective   Patient ID: Amy Howell, female    DOB: 12-Jun-1956  Age: 68 y.o. MRN: [id number]  Chief Complaint  Patient presents with   Follow-up    Follow up from last office visit / bilateral foot pain # 7 / weight loss    Amy Howell is a 68 year old female with a past medical history of HFrEF, hyperlipidemia, CKD stage II, chronic tobacco use, and basilar artery stenosis who presented today for a routine follow-up.  Please see problem-based assessment and plan below.   Review of Systems  Constitutional:  Positive for malaise/fatigue. Negative for chills and fever.  Eyes:  Negative for blurred vision and double vision.  Respiratory:  Positive for shortness of breath. Negative for cough and wheezing.   Cardiovascular:  Positive for leg swelling. Negative for chest pain and palpitations.  Gastrointestinal:  Positive for constipation. Negative for abdominal pain, blood in stool, melena, nausea and vomiting.  Neurological:  Positive for weakness. Negative for dizziness, tingling, sensory change, focal weakness and headaches.      Objective:     BP (!) 153/68 (BP Location: Left Arm, Patient Position: Sitting, Cuff Size: Small)   Pulse 87   Temp 98 F (36.7 C) (Oral)   Ht 5' (1.524 m)   Wt 74 lb (33.6 kg)   SpO2 94%   BMI 14.45 kg/m     Const: Awake, alert in NAD HENT: Normocephalic, atraumatic, mucus membranes moist Card: RRR, No MRG, 1+ pitting edema on the left lower extremity to the shin, 1+ pitting edema to the ankle on the right lower extremity.  Distal pulses intact Resp: LCTAB, no increased work of breathing Abd: Soft, NTND, Bsx4 Extremities: Warm, dry      No results found for any visits on 09/14/24.  Last metabolic panel Lab Results  Component Value Date   GLUCOSE 80 08/10/2024   NA 131 (L) 08/10/2024   K 4.9 08/10/2024   CL 94 (L) 08/10/2024   CO2 22 08/10/2024   BUN 21 08/10/2024   CREATININE 1.13 (H) 08/10/2024    EGFR 52 (L) 08/10/2024   CALCIUM  8.6 (L) 08/10/2024   PHOS 3.4 12/01/2023   PROT 5.7 (L) 11/30/2023   ALBUMIN 2.5 (L) 12/01/2023   LABGLOB 3.1 06/28/2021   AGRATIO 1.4 06/28/2021   BILITOT 0.6 11/30/2023   ALKPHOS 117 11/30/2023   AST 41 11/30/2023   ALT 45 (H) 11/30/2023   ANIONGAP 8 12/26/2023      The ASCVD Risk score (Arnett DK, et al., 2019) failed to calculate for the following reasons:   Risk score cannot be calculated because patient has a medical history suggesting prior/existing ASCVD    Assessment & Plan:   Assessment & Plan Lung nodule, 5mm upper lobe, incidental 2024 on CTA The patient presents today with concerns for her weight loss.  She states that this has been going on for many years but feels like it has gotten more drastic over the past few months or so.  She is currently 74 pounds with a BMI of 14.45.  She does endorse chronic fatigue.  An incidental 5 mm lung nodule in the right upper lobe was found on CT angio in October 2024.  We will follow-up on this with a noncontrast CT scan as the patient is at high risk of lung malignancy due to decades long history of cigarette smoking. Orders:   CT Chest Wo Contrast; Future  Chronic heart failure with reduced ejection fraction (  HFrEF, <= 40%) (HCC) The patient was recently seen in the office on 9/9 where she was reporting worsening shortness of breath and lower extremity edema.  At this time a long discussion was had with her about her disease process and the reasoning behind her fluid buildup.  She is currently on Coreg  12.5 each morning and 6.25 each night as well as torsemide  10 on Mondays Wednesdays and Fridays.  It seems as if extensive conversations have been had in the past regarding increasing her GDMT however the patient is very resistant to medications as she believes they are making her condition worse.  Over the past week she did increase her torsemide  for up to 20 and has noticed a noticeable improvement in  her lower extremity edema.  I did instruct the patient to continue the 20 mg of torsemide  however when her edema resolves to decrease back to 10 mg Monday Wednesday Friday to reduce risk of overdiuresis.  Will check BMP today Orders:   Basic metabolic panel with GFR  Underweight The patient is very concerned about her weight loss.  She is 74 pounds today with a BMI of 14.45.  She states that to 2018 she was over 100 pounds and has been losing weight since then however has noticed in the past few months she feels as if she is thinner than ever.  A long discussion was had over causes of weight loss.  She denies GI disturbances including fatty stools and maintains a good appetite.  She also denies dysphagia.  Discussed with her and the family that at this time her weight loss could be contributed to a few things including cardiac cachexia as well as a possible malignant process due to the nodule previously seen on her lung.  Will follow-up with a Noncon CT scan to monitor the right upper lobe lung nodule.  We did discuss referring her to our nutritionist to help with a well-balanced balanced diet and ensuring she is getting adequate intake however the patient was resistant and stated that she was a picky eater and would prefer to eat what she likes. This would be worth following up on in future visits.    Constipation, unspecified constipation type Patient complains today of frequent bowel movements however when probing further, she reports that she often has solid small stool balls and will sometimes have liquid stools but she is still feeling as if she has to push hard to pass them.  We did have a long discussion about encopresis and the likelihood that although she is feeling as if she is likely having liquid stools, she is likely chronically constipated.  We did discuss initiating a bowel regimen including MiraLAX  and senna however the patient is very resistant to be on any more pharmaceutical agents.  We  discussed that you can buy both of these over-the-counter and it would be worth attempting if she is still feeling as if she is having frequent loose stools as well as stools that are difficult to pass.    Hx of colonic polyps While discussing her GI disturbances, the patient mentioned that she did have a colonoscopy in [place]  over 10 years ago where they did find polyps.  She was recently seen by GI in February of this year however had recently been hospitalized and was not in the optimal state of health to undergo colonoscopy so a shared decision  was made between provider and family to hold off on colonoscopy at this time.  The patient  is requesting a colonoscopy as she is worried that polyps may be resulting in her frequent bowel movements.  As she is established with [facility], we did provide the phone number and instructed her to make an appointment.     Schuyler Novak, DO

## 2024-09-14 NOTE — Assessment & Plan Note (Signed)
 The patient is very concerned about her weight loss.  She is 74 pounds today with a BMI of 14.45.  She states that to 2018 she was over 100 pounds and has been losing weight since then however has noticed in the past few months she feels as if she is thinner than ever.  A long discussion was had over causes of weight loss.  She denies GI disturbances including fatty stools and maintains a good appetite.  She also denies dysphagia.  Discussed with her and the family that at this time her weight loss could be contributed to a few things including cardiac cachexia as well as a possible malignant process due to the nodule previously seen on her lung.  Will follow-up with a Noncon CT scan to monitor the right upper lobe lung nodule.  We did discuss referring her to our nutritionist to help with a well-balanced balanced diet and ensuring she is getting adequate intake however the patient was resistant and stated that she was a picky eater and would prefer to eat what she likes. This would be worth following up on in future visits.

## 2024-09-15 ENCOUNTER — Ambulatory Visit: Payer: Self-pay

## 2024-09-15 LAB — BASIC METABOLIC PANEL WITH GFR
BUN/Creatinine Ratio: 24 (ref 12–28)
BUN: 28 mg/dL — ABNORMAL HIGH (ref 8–27)
CO2: 21 mmol/L (ref 20–29)
Calcium: 9.4 mg/dL (ref 8.7–10.3)
Chloride: 103 mmol/L (ref 96–106)
Creatinine, Ser: 1.18 mg/dL — ABNORMAL HIGH (ref 0.57–1.00)
Glucose: 86 mg/dL (ref 70–99)
Potassium: 4.8 mmol/L (ref 3.5–5.2)
Sodium: 139 mmol/L (ref 134–144)
eGFR: 50 mL/min/1.73 — ABNORMAL LOW (ref >59)

## 2024-09-15 NOTE — Progress Notes (Signed)
 BMP with stable renal function from previous.  Electrolytes within normal limits, patient tolerating increased torsemide  dose over the past week well.

## 2024-09-15 NOTE — Progress Notes (Signed)
Internal Medicine Clinic Attending  I was physically present during the key portions of the resident provided service and participated in the medical decision making of patient's management care. I reviewed pertinent patient test results.  The assessment, diagnosis, and plan were formulated together and I agree with the documentation in the resident's note.  Mercie Eon, MD

## 2024-09-17 NOTE — Telephone Encounter (Signed)
 Copied from CRM [id number]. Topic: Referral - Status >> Sep 17, 2024  9:04 AM Alfonso ORN wrote: Reason for CRM: Daughter Mikyah Alamo is checking on the status of  the referral for CT scan for chest for patient , regarding pt. Have lung nodule  Please contact daughter on status - if daughter do not respond can leave message

## 2024-09-30 ENCOUNTER — Ambulatory Visit: Payer: Self-pay

## 2024-09-30 NOTE — Progress Notes (Unsigned)
 Patient name: Amy Howell Date of birth: June 28, 1956 Date of visit: 10/01/24  Type of visit: Established Patient Office Visit  Subjective   Chief concern:  Chief Complaint  Patient presents with   Follow-up   Foot Swelling    Pain to the touch and hurt to walk.     Amy Howell is a 68 y.o. 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   Patient Active Problem List   Diagnosis Date Noted   Peripheral polyneuropathy 10/01/2024   Presence of stent in coronary artery in patient with coronary artery disease 07/11/2024   Chronic anemia 12/04/2023   Esophageal dysphagia, intermittent 12/01/2023   Antiplatelet or antithrombotic long-term use 12/01/2023   Constipation 11/21/2023   Medication nonadherence due to intolerance 11/21/2023   Ischemic cardiomyopathy 09/17/2023   History of stroke 09/15/2023   Severe protein-calorie malnutrition 09/13/2023   Underweight 09/13/2023   Lung nodule, 5mm upper lobe, incidental 2024 on CTA 09/12/2023   CKD stage 3a, GFR 45-59 ml/min (HCC) 09/12/2023   Hyperlipidemia 04/09/2023   Chronic heart failure with reduced ejection fraction (HFrEF, <= 40%) (HCC) 11/22/2022   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, right (HCC) 06/10/2022   History of smoking 03/30/2018   Glaucoma of both eyes 03/30/2018   History of loop recorder 03/30/2018   Prediabetes 04/10/2017   Unintentional weight loss    Basilar artery stenosis    Hypertension 03/26/2017     Past Surgical History:  Procedure Laterality Date   ABDOMINAL HYSTERECTOMY     CARDIAC DEFIBRILLATOR PLACEMENT     CORONARY STENT INTERVENTION N/A 11/26/2022    Procedure: CORONARY STENT INTERVENTION;  Surgeon: Dann Candyce RAMAN, MD;  Location: [facility];  Service: Cardiovascular;  Laterality: N/A;   gum operation     Implantable loop recorder removal  02/01/2019   MDT Reveal LINQ explantation in office by JA   IR 3D INDEPENDENT WKST  05/07/2017   IR 3D INDEPENDENT WKST  09/17/2023   IR ANGIO INTRA EXTRACRAN SEL COM CAROTID INNOMINATE BILAT MOD SED  04/23/2017   IR ANGIO INTRA EXTRACRAN SEL COM CAROTID INNOMINATE BILAT MOD SED  08/06/2017   IR ANGIO INTRA EXTRACRAN SEL COM CAROTID INNOMINATE UNI R MOD SED  05/07/2017   IR ANGIO INTRA EXTRACRAN SEL INTERNAL CAROTID BILAT MOD SED  09/17/2023   IR ANGIO VERTEBRAL SEL VERTEBRAL BILAT MOD SED  04/23/2017   IR ANGIO VERTEBRAL SEL VERTEBRAL BILAT MOD SED  08/06/2017   IR ANGIOGRAM FOLLOW UP STUDY  05/07/2017   IR ANGIOGRAM SELECTIVE EACH ADDITIONAL VESSEL  05/07/2017   IR RADIOLOGIST EVAL & MGMT  04/16/2017   IR RADIOLOGIST EVAL & MGMT  05/29/2017   IR RADIOLOGIST EVAL & MGMT  09/29/2023   IR RADIOLOGIST EVAL & MGMT  11/05/2023   IR TRANSCATH/EMBOLIZ  05/07/2017   LOOP RECORDER INSERTION N/A 03/28/2017   Procedure: Loop Recorder Insertion;  Surgeon: Lynwood Rakers, MD;  Location: [facility];  Service: Cardiovascular;  Laterality: N/A;   MASTECTOMY W/ SENTINEL NODE BIOPSY Right 06/25/2022   Procedure: RIGHT MASTECTOMY WITH AXILLARY SENTINEL LYMPH NODE BIOPSY;  Surgeon: Belinda Cough, MD;  Location:  SURGERY CENTER;  Service: General;  Laterality:  Right;   RADIOLOGY WITH ANESTHESIA N/A 05/07/2017   Procedure: EMBOLIZATION;  Surgeon: Dolphus Carrion, MD;  Location: [facility] OR;  Service: Radiology;  Laterality: N/A;   RADIOLOGY WITH ANESTHESIA N/A 08/06/2017   Procedure: RADIOLOGY WITH ANESTHESIA STENTING;  Surgeon: Dolphus Carrion, MD;  Location: [facility] OR;  Service: Radiology;  Laterality: N/A;   RIGHT/LEFT HEART CATH AND CORONARY ANGIOGRAPHY N/A 11/26/2022   Procedure: RIGHT/LEFT HEART CATH AND CORONARY  ANGIOGRAPHY;  Surgeon: Dann Candyce RAMAN, MD;  Location[facility] INVASIVE CV LAB;  Service: Cardiovascular;  Laterality: N/A;   TEE WITHOUT CARDIOVERSION N/A 03/28/2017   Procedure: TRANSESOPHAGEAL ECHOCARDIOGRAM (TEE);  Surgeon: Redell RAMAN Shallow, MD;  Location: [facility] ENDOSCOPY;  Service: Cardiovascular;  Laterality: N/A;    ROS negative unless otherwise indicated in the HPI or Assessment and Plan.  Current Outpatient Medications  Medication Instructions   aspirin  81 mg, Oral, Daily   Brilinta  90 mg, Oral, 2 times daily   brimonidine  (ALPHAGAN ) 0.2 % ophthalmic solution 1 drop, Nightly   carvedilol  (COREG ) 12.5 mg, Oral, 2 times daily with meals   dorzolamide  (TRUSOPT ) 2 % ophthalmic solution 1 drop, Both Eyes, 2 times daily   feeding supplement (ENSURE ENLIVE / ENSURE PLUS) LIQD 237 mLs, Oral, 3 times daily between meals   latanoprost  (XALATAN ) 0.005 % ophthalmic solution Instill 1 drop into both eyes at bedtime   Multiple Vitamin (MULTIVITAMIN WITH MINERALS) TABS tablet 1 tablet, Oral, Daily   timolol  (TIMOPTIC ) 0.5 % ophthalmic solution 1 drop, Both Eyes, Every morning   timolol  (TIMOPTIC ) 0.5 % ophthalmic solution 1 drop, Both Eyes, Every morning   timolol  (TIMOPTIC ) 0.5 % ophthalmic solution 1 drop, Both Eyes, 2 times daily   torsemide  (DEMADEX ) 20 mg, Oral, Daily    Social History   Tobacco Use   Smoking status: Former    Current packs/day: 0.50    Average packs/day: 0.5 packs/day for 56.0 years (28.0 ttl pk-yrs)    Types: Cigarettes   Smokeless tobacco: Never  Vaping Use   Vaping status: Never Used  Substance Use Topics   Alcohol use: No   Drug use: No      Objective  Today's Vitals   10/01/24 0918  BP: 134/68  Pulse: 86  Temp: 97.6 F (36.4 C)  TempSrc: Oral  SpO2: 100%  Weight: 75 lb 3.2 oz (34.1 kg)  Height: 5' (1.524 m)  Body mass index is 14.69 kg/m.   Physical Exam: Constitutional: well-appearing, under nourished and underweight; no acute distress HENT:  normocephalic atraumatic, mucous membranes moist Eyes: conjunctiva non-erythematous Cardiovascular: regular rate and rhythm, no m/r/g Pulmonary/Chest: normal work of breathing on room air, decreased breath sounds in bilateral upper lobes. Abdominal: soft, non-tender, non-distended MSK: normal bulk and tone Neurological: alert & oriented x 3, no focal deficit.  No sensory deficits, strength is 5/5 in lower extremity. Skin: warm and dry Extremities: BLE without erythema.  There is bilateral lower extremity edema to feet with skin tightening around her mid-shin.  No obvious lesions/ulceration/deformities noted to her feet.  No tenderness to palpation of lower extremities. Psych: normal mood and behavior  Last CBC Lab Results  Component Value Date   WBC 5.6 07/09/2024   HGB 10.9 (L) 07/09/2024   HCT 36.0 07/09/2024   MCV 99 (H) 07/09/2024   MCH 29.9 07/09/2024   RDW 13.0 07/09/2024   PLT 249 07/09/2024   Last metabolic panel Lab Results  Component Value Date   GLUCOSE 86 09/14/2024   NA 139 09/14/2024  K 4.8 09/14/2024   CL 103 09/14/2024   CO2 21 09/14/2024   BUN 28 (H) 09/14/2024   CREATININE 1.18 (H) 09/14/2024   EGFR 50 (L) 09/14/2024   CALCIUM  9.4 09/14/2024   PHOS 3.4 12/01/2023   PROT 5.7 (L) 11/30/2023   ALBUMIN 2.5 (L) 12/01/2023   LABGLOB 3.1 06/28/2021   AGRATIO 1.4 06/28/2021   BILITOT 0.6 11/30/2023   ALKPHOS 117 11/30/2023   AST 41 11/30/2023   ALT 45 (H) 11/30/2023   ANIONGAP 8 12/26/2023   Last hemoglobin A1c Lab Results  Component Value Date   HGBA1C 5.5 07/09/2024   Last vitamin B12 and Folate Lab Results  Component Value Date   VITAMINB12 1,193 (H) 12/01/2023   FOLATE 33.1 12/01/2023        Assessment & Plan  Peripheral polyneuropathy Assessment & Plan: Patient states that for the last 4-6 weeks she has been experiencing bilateral foot swelling with a 10/10 constant pain described as electricity feeling in her ankles.  She thinks this is  related to the carvedilol  that was prescribed at her last office visit, however, the patient's son-in-law and daughter do not agree as she has been on carvedilol  in the past without the symptoms.  The patient does note that she has been wearing very tight shoes and does note improvement of her pain with mild pressure from Ace bandage and hot water from her shower.  The pain sometimes improves with rest and elevation.  She experiences this pain at all times a day and has trouble falling asleep because of it. On exam, the patient has mild edema to bilateral feet.  There are no obvious lesions/ulcers/malformations.  There is no sensory deficit.  Strength is normal in her feet and legs.  DP/PT pulses are not palpable.  Feet are warm and appear well-perfused. Patient symptoms likely represent peripheral neuropathy.  She does have a history of vitamin B deficiency with last value in 11/2023 being elevated at 1193.  Attempted to discuss medication changes from carvedilol  to Toprol -XL for the patient's heart failure if she feels that this is a problem.  However, the patient is very against any addition of new medications.  She feels that her medications are harming her and has poor health literacy overall which is contributing to her frustration.  Will recheck vitamin B12 and TSH levels.  Lower suspicion for vascular etiology, however the patient did not have palpable pulses and does have history of CAD and is on aspirin  and ticagrelor .  Will order vascular ultrasound/ABI.  Encouraged conservative measures with Tylenol , heating pad, Ace wraps.  Will follow-up in 4 weeks for further evaluation.  Plan: - Vitamin B12, TSH pending - Vascular ultrasound with ABIs pending - Conservative measures with Tylenol , heating pad, ace wrap - Counseled the patient on avoiding NSAIDs due to history of CKD   Orders: -     TSH -     Vitamin B12 -     VAS US  ABI WITH/WO TBI; Future  Lung nodule, 5mm upper lobe, incidental 2024  on CTA Assessment & Plan: Patient with a BMI of 14.69 and significant smoking history.  History of breast cancer with a lung nodule noted on CT in 2024.  She is scheduled for her follow-up CT chest today.  Patient does note shortness of breath and dyspnea on exertion.  She thinks this is new, however her family members (daughter and son-in-law) state that she has been experiencing this as a chronic problem. - CT chest  pending     Return in about 4 weeks (around 10/29/2024) for Peripheral neuropathy follow up.    Patient case discussed with Dr. Rosan, who also saw and evaluated the patient.  Kmari Brian, MD [facility]  PGY-1 10/01/2024, 11:56 AM

## 2024-09-30 NOTE — Telephone Encounter (Signed)
 1st attempt at calling patient--left a voicemial for patient to call back           Copied from CRM #[id number]. Topic: Clinical - Medical Advice >> Sep 30, 2024  9:04 AM Merlynn LABOR wrote: Reason for CRM: Patients daughter called in requesting medical advice on foot pain. Please contact to advise what medication patient can take for pain. Patient can vbe reached at [phone].

## 2024-09-30 NOTE — Telephone Encounter (Signed)
 Talked to pt's daughter who stated she cannot get off work to bring pt today for an appt. Appt scheduled w/Dr Waymond tomorrow 10/31 @ [phone]. Made aware to take pt to UC if pain becomes worse.

## 2024-09-30 NOTE — Telephone Encounter (Signed)
  FYI Only or Action Required?: FYI only for provider: appointment scheduled on 10/01/24.  Patient was last seen in primary care on 09/14/2024 by Myrna Bitters, DO.  Called Nurse Triage reporting Pain.  Symptoms began several weeks ago.  Interventions attempted: OTC medications: apple cidar vinegar, mustard.  Symptoms are: gradually worsening.  Triage Disposition: See Physician Within 24 Hours  Patient/caregiver understands and will follow disposition?: Yes      Reason for Disposition  Numbness (i.e., loss of sensation) in foot or toes  (Exception: Just tingling; numbness present > 2 weeks.)  Answer Assessment - Initial Assessment Questions Appt scheduled tomorrow with other provider. None available with PCP until Nov 18.        1. ONSET: When did the pain start?      Greater than few weeks per patient daughter 2. LOCATION: Where is the pain located?      Bilateral feet  3. PAIN: How bad is the pain?    (Scale 1-10; or mild, moderate, severe)     Can walk, did not report pain level but daughter reports severe 4. WORK OR EXERCISE: Has there been any recent work or exercise that involved this part of the body?      na 5. CAUSE: What do you think is causing the foot pain?     Stinging pain , electrical pain. Some N/T at times not all of the times  6. OTHER SYMPTOMS: Do you have any other symptoms? (e.g., leg pain, rash, fever, numbness)     Apple cidar vinegar used and mustard, feet cramping. Can walk but feet hurt to walk. 7. PREGNANCY: Is there any chance you are pregnant? When was your last menstrual period?     na  Protocols used: Foot Pain-A-AH

## 2024-10-01 ENCOUNTER — Ambulatory Visit: Payer: Medicare (Managed Care)

## 2024-10-01 ENCOUNTER — Ambulatory Visit
Admission: RE | Admit: 2024-10-01 | Discharge: 2024-10-01 | Disposition: A | Discharge status: Home / Self Care | Payer: Medicare (Managed Care) | Source: Ambulatory Visit | Attending: Internal Medicine | Admitting: Internal Medicine

## 2024-10-01 ENCOUNTER — Other Ambulatory Visit (HOSPITAL_COMMUNITY): Payer: Self-pay

## 2024-10-01 VITALS — BP 134/68 | HR 86 | Temp 97.6°F | Ht 60.0 in | Wt 75.2 lb

## 2024-10-01 DIAGNOSIS — G629 Polyneuropathy, unspecified: Secondary | ICD-10-CM | POA: Diagnosis not present

## 2024-10-01 DIAGNOSIS — R911 Solitary pulmonary nodule: Secondary | ICD-10-CM | POA: Diagnosis not present

## 2024-10-01 DIAGNOSIS — Z79899 Other long term (current) drug therapy: Secondary | ICD-10-CM | POA: Diagnosis not present

## 2024-10-01 DIAGNOSIS — Z87891 Personal history of nicotine dependence: Secondary | ICD-10-CM | POA: Diagnosis not present

## 2024-10-01 DIAGNOSIS — J439 Emphysema, unspecified: Secondary | ICD-10-CM | POA: Diagnosis not present

## 2024-10-01 NOTE — Patient Instructions (Addendum)
 Thank you, Ms.Yiselle Zecca for allowing us  to provide your care today. Today we discussed the following:  - We are not entirely sure what your foot pain is from so we would like to get some blood work. We are checking your thyroid hormone and vitamin B12 levels as these are easily treated. - We would also like to check the blood flow in your legs as this could also be a cause of your symptoms. - We would like you to come back in 4-6 weeks to see if your pain has changed. We will schedule you with Dr. Marylu if he is available.  I have ordered the following labs for you:  Lab Orders         TSH         Vitamin B12      Tests ordered today:  Vascular Ultrasound - Ankle Brachial Index  Referrals ordered today:   Referral Orders  No referral(s) requested today     I have ordered the following medication/changed the following medications:   Stop the following medications: There are no discontinued medications.   Start the following medications: No orders of the defined types were placed in this encounter.    Follow up: 4 weeks    Remember: Please continue taking carvedilol  for your heart and blood pressure.  Should you have any questions or concerns please call the Internal Medicine Clinic at [phone].     Skylan Lara, MD [facility]

## 2024-10-01 NOTE — Assessment & Plan Note (Addendum)
 Patient states that for the last 4-6 weeks she has been experiencing bilateral foot swelling with a 10/10 constant pain described as electricity feeling in her ankles.  She thinks this is related to the carvedilol  that was prescribed at her last office visit, however, the patient's son-in-law and daughter do not agree as she has been on carvedilol  in the past without the symptoms.  The patient does note that she has been wearing very tight shoes and does note improvement of her pain with mild pressure from Ace bandage and hot water from her shower.  The pain sometimes improves with rest and elevation.  She experiences this pain at all times a day and has trouble falling asleep because of it. On exam, the patient has mild edema to bilateral feet.  There are no obvious lesions/ulcers/malformations.  There is no sensory deficit.  Strength is normal in her feet and legs.  DP/PT pulses are not palpable.  Feet are warm and appear well-perfused. Patient symptoms likely represent peripheral neuropathy.  She does have a history of vitamin B deficiency with last value in 11/2023 being elevated at 1193.  Attempted to discuss medication changes from carvedilol  to Toprol -XL for the patient's heart failure if she feels that this is a problem.  However, the patient is very against any addition of new medications.  She feels that her medications are harming her and has poor health literacy overall which is contributing to her frustration.  Will recheck vitamin B12 and TSH levels.  Lower suspicion for vascular etiology, however the patient did not have palpable pulses and does have history of CAD and is on aspirin  and ticagrelor .  Will order vascular ultrasound/ABI.  Encouraged conservative measures with Tylenol , heating pad, Ace wraps.  Will follow-up in 4 weeks for further evaluation.  Plan: - Vitamin B12, TSH pending - Vascular ultrasound with ABIs pending - Conservative measures with Tylenol , heating pad, ace wrap -  Counseled the patient on avoiding NSAIDs due to history of CKD

## 2024-10-01 NOTE — Assessment & Plan Note (Addendum)
 Patient with a BMI of 14.69 and significant smoking history.  History of breast cancer with a lung nodule noted on CT in 2024.  She is scheduled for her follow-up CT chest today.  Patient does note shortness of breath and dyspnea on exertion.  She thinks this is new, however her family members (daughter and son-in-law) state that she has been experiencing this as a chronic problem. - CT chest pending

## 2024-10-01 NOTE — Progress Notes (Signed)
 Internal Medicine Clinic Attending  I was physically present during the key portions of the resident provided service and participated in the medical decision making of patient's management care. I reviewed pertinent patient test results.  The assessment, diagnosis, and plan were formulated together and I agree with the documentation in the resident's note.  Rosan Dayton BROCKS, DO

## 2024-10-02 LAB — TSH: TSH: 0.931 u[IU]/mL (ref 0.450–4.500)

## 2024-10-02 LAB — VITAMIN B12: Vitamin B-12: 544 pg/mL (ref 232–1245)

## 2024-10-04 ENCOUNTER — Ambulatory Visit: Payer: Self-pay

## 2024-10-13 ENCOUNTER — Ambulatory Visit (HOSPITAL_COMMUNITY)
Admission: RE | Admit: 2024-10-13 | Discharge: 2024-10-13 | Disposition: A | Discharge status: Home / Self Care | Payer: Medicare (Managed Care) | Source: Ambulatory Visit | Attending: Internal Medicine | Admitting: Internal Medicine

## 2024-10-13 DIAGNOSIS — Z87891 Personal history of nicotine dependence: Secondary | ICD-10-CM | POA: Insufficient documentation

## 2024-10-13 DIAGNOSIS — I10 Essential (primary) hypertension: Secondary | ICD-10-CM | POA: Diagnosis not present

## 2024-10-13 DIAGNOSIS — E785 Hyperlipidemia, unspecified: Secondary | ICD-10-CM | POA: Diagnosis not present

## 2024-10-13 DIAGNOSIS — I779 Disorder of arteries and arterioles, unspecified: Secondary | ICD-10-CM | POA: Diagnosis not present

## 2024-10-13 DIAGNOSIS — R600 Localized edema: Secondary | ICD-10-CM | POA: Diagnosis not present

## 2024-10-13 DIAGNOSIS — G629 Polyneuropathy, unspecified: Secondary | ICD-10-CM | POA: Insufficient documentation

## 2024-10-14 ENCOUNTER — Other Ambulatory Visit: Payer: Self-pay

## 2024-10-14 DIAGNOSIS — I739 Peripheral vascular disease, unspecified: Secondary | ICD-10-CM

## 2024-11-03 ENCOUNTER — Other Ambulatory Visit: Payer: Self-pay

## 2024-11-03 ENCOUNTER — Encounter: Payer: Self-pay | Admitting: Vascular Surgery

## 2024-11-03 ENCOUNTER — Ambulatory Visit: Payer: Medicare (Managed Care) | Attending: Vascular Surgery | Admitting: Vascular Surgery

## 2024-11-03 VITALS — BP 138/84 | HR 87 | Temp 97.8°F

## 2024-11-03 DIAGNOSIS — I70222 Atherosclerosis of native arteries of extremities with rest pain, left leg: Secondary | ICD-10-CM | POA: Diagnosis not present

## 2024-11-03 DIAGNOSIS — I70223 Atherosclerosis of native arteries of extremities with rest pain, bilateral legs: Secondary | ICD-10-CM

## 2024-11-03 NOTE — Progress Notes (Signed)
 Patient ID: Amy Howell, female   DOB: 12-08-1955, 68 y.o.   MRN: [id number]  Reason for Consult: New Patient (Initial Visit)   Referred by Marylu Gee, DO  Subjective:     HPI:  Amy Howell is a 68 y.o. female history of hypertension, diabetes and cerebral artery aneurysm.  She is a current everyday smoker.  She has pain in both of her legs which she states occurs with walking which she does with a walker, and at rest.  The pain is described as swelling, stabbing and shooting as well as pain in her feet when she is sleeping.  She states that she has difficulty sleeping due to leg pain as well as shortness of breath and also has difficulty walking secondary to pain in her legs and shortness of breath.  She denies tissue loss or ulceration but does have discoloration of the skin of her feet.  She is on Brilinta  for history of stroke does not take other blood thinners and states that she cannot take statins.  Past Medical History:  Diagnosis Date   Aneurysm of middle cerebral artery    Cataracts, bilateral    Cerebral infarction due to carotid artery stenosis (HCC)    Diabetes mellitus type 2 in nonobese [facility])    Esophageal dysphagia, intermittent 12/01/2023   Glaucoma    History of smoking 03/30/2018   Hypertension    Hypertensive emergency 11/22/2022   Lung nodule, 5mm upper lobe, incidental 2024 on CTA 09/12/2023   Found on 09/2023 CTA, 5 mm upper lobe, no concerning characteristics, no further f/u imaging recommended by radiologist unless high risk, then one year imaging could be considered.     Prediabetes 01/08/2019   Lab Results      Component    Value    Date           HGBA1C    5.7 (H)    11/21/2022        Presence of stent in coronary artery in patient with coronary artery disease 07/11/2024   Stroke (HCC)    no residual, series of mini strokes   Tobacco abuse    Vertigo    Family History  Problem Relation Age of Onset   Dementia Mother    Kidney disease Father     Hypertension Father    Diabetes Father    Heart disease Father    Lung cancer Maternal Aunt        dx after 31   Prostate cancer Other        MGM's brother   Breast cancer Neg Hx    Colon cancer Neg Hx    Liver disease Neg Hx    Esophageal cancer Neg Hx    Past Surgical History:  Procedure Laterality Date   ABDOMINAL HYSTERECTOMY     CARDIAC DEFIBRILLATOR PLACEMENT     CORONARY STENT INTERVENTION N/A 11/26/2022   Procedure: CORONARY STENT INTERVENTION;  Surgeon: Dann Candyce RAMAN, MD;  Location: [facility];  Service: Cardiovascular;  Laterality: N/A;   gum operation     Implantable loop recorder removal  02/01/2019   MDT Reveal LINQ explantation in office by JA   IR 3D INDEPENDENT WKST  05/07/2017   IR 3D INDEPENDENT WKST  09/17/2023   IR ANGIO INTRA EXTRACRAN SEL COM CAROTID INNOMINATE BILAT MOD SED  04/23/2017   IR ANGIO INTRA EXTRACRAN SEL COM CAROTID INNOMINATE BILAT MOD SED  08/06/2017   IR ANGIO INTRA EXTRACRAN SEL COM CAROTID  INNOMINATE UNI R MOD SED  05/07/2017   IR ANGIO INTRA EXTRACRAN SEL INTERNAL CAROTID BILAT MOD SED  09/17/2023   IR ANGIO VERTEBRAL SEL VERTEBRAL BILAT MOD SED  04/23/2017   IR ANGIO VERTEBRAL SEL VERTEBRAL BILAT MOD SED  08/06/2017   IR ANGIOGRAM FOLLOW UP STUDY  05/07/2017   IR ANGIOGRAM SELECTIVE EACH ADDITIONAL VESSEL  05/07/2017   IR RADIOLOGIST EVAL & MGMT  04/16/2017   IR RADIOLOGIST EVAL & MGMT  05/29/2017   IR RADIOLOGIST EVAL & MGMT  09/29/2023   IR RADIOLOGIST EVAL & MGMT  11/05/2023   IR TRANSCATH/EMBOLIZ  05/07/2017   LOOP RECORDER INSERTION N/A 03/28/2017   Procedure: Loop Recorder Insertion;  Surgeon: Lynwood Rakers, MD;  Location: [facility];  Service: Cardiovascular;  Laterality: N/A;   MASTECTOMY W/ SENTINEL NODE BIOPSY Right 06/25/2022   Procedure: RIGHT MASTECTOMY WITH AXILLARY SENTINEL LYMPH NODE BIOPSY;  Surgeon: Belinda Cough, MD;  Location: [facility];  Service: General;  Laterality: Right;   RADIOLOGY WITH  ANESTHESIA N/A 05/07/2017   Procedure: EMBOLIZATION;  Surgeon: Dolphus Carrion, MD;  Location: [facility];  Service: Radiology;  Laterality: N/A;   RADIOLOGY WITH ANESTHESIA N/A 08/06/2017   Procedure: RADIOLOGY WITH ANESTHESIA STENTING;  Surgeon: Dolphus Carrion, MD;  Location: [facility];  Service: Radiology;  Laterality: N/A;   RIGHT/LEFT HEART CATH AND CORONARY ANGIOGRAPHY N/A 11/26/2022   Procedure: RIGHT/LEFT HEART CATH AND CORONARY ANGIOGRAPHY;  Surgeon: Dann Candyce RAMAN, MD;  Location: [facility] INVASIVE CV LAB;  Service: Cardiovascular;  Laterality: N/A;   TEE WITHOUT CARDIOVERSION N/A 03/28/2017   Procedure: TRANSESOPHAGEAL ECHOCARDIOGRAM (TEE);  Surgeon: Redell RAMAN Shallow, MD;  Location: [facility] ENDOSCOPY;  Service: Cardiovascular;  Laterality: N/A;    Short Social History:  Social History   Tobacco Use   Smoking status: Former    Current packs/day: 0.50    Average packs/day: 0.5 packs/day for 56.0 years (28.0 ttl pk-yrs)    Types: Cigarettes   Smokeless tobacco: Never  Substance Use Topics   Alcohol use: No    Allergies  Allergen Reactions   Bidil  [Isosorb Dinitrate-Hydralazine ] Other (See Comments)    Caused a stroke   Chlorhexidine  Dermatitis   Lisinopril  Other (See Comments)    Angioedema   Porcine (Pork) Protein-Containing Drug Products Hives   Rosuvastatin  Other (See Comments)    headache   Statins Other (See Comments)    Headaches and swelling    Current Outpatient Medications  Medication Sig Dispense Refill   aspirin  81 MG chewable tablet Chew 1 tablet (81 mg total) by mouth daily. 30 tablet 3   brimonidine  (ALPHAGAN ) 0.2 % ophthalmic solution Place 1 drop into both eyes at bedtime.     carvedilol  (COREG ) 12.5 MG tablet Take 1 tablet (12.5 mg total) by mouth 2 (two) times daily with a meal. 60 tablet 3   dorzolamide  (TRUSOPT ) 2 % ophthalmic solution Place 1 drop into both eyes 2 (two) times daily. 10 mL 6   feeding supplement (ENSURE ENLIVE / ENSURE PLUS) LIQD Take 237 mLs  by mouth 3 (three) times daily between meals. (Patient not taking: Reported on 01/08/2024) 237 mL 12   latanoprost  (XALATAN ) 0.005 % ophthalmic solution Instill 1 drop into both eyes at bedtime 22.5 mL 3   Multiple Vitamin (MULTIVITAMIN WITH MINERALS) TABS tablet Take 1 tablet by mouth daily. (Patient not taking: Reported on 01/08/2024) 30 tablet 0   ticagrelor  (BRILINTA ) 90 MG TABS tablet Take 1 tablet (90 mg total) by mouth  2 (two) times daily. 60 tablet 11   timolol  (TIMOPTIC ) 0.5 % ophthalmic solution Place 1 drop into both eyes every morning. 45 mL 3   timolol  (TIMOPTIC ) 0.5 % ophthalmic solution Place 1 drop into both eyes every morning. 45 mL 3   timolol  (TIMOPTIC ) 0.5 % ophthalmic solution Place 1 drop into both eyes 2 (two) times daily. 10 mL 7   torsemide  (DEMADEX ) 20 MG tablet Take 1 tablet (20 mg total) by mouth daily. 30 tablet 2   No current facility-administered medications for this visit.    Review of Systems  Constitutional:  Constitutional negative. HENT: HENT negative.  Eyes: Eyes negative.  Respiratory: Respiratory negative.  Cardiovascular: Positive for claudication and leg swelling.  GI: Gastrointestinal negative.  Musculoskeletal: Positive for leg pain.  Neurological: Neurological negative. Hematologic: Hematologic/lymphatic negative.  Psychiatric: Psychiatric negative.        Objective:  Objective   Vitals:   11/03/24 0841  BP: 138/84  Pulse: 87  Temp: 97.8 F (36.6 C)  SpO2: 98%   There is no height or weight on file to calculate BMI.  Physical Exam HENT:     Head: Normocephalic.     Nose: Nose normal.     Mouth/Throat:     Mouth: Mucous membranes are moist.  Eyes:     Pupils: Pupils are equal, round, and reactive to light.  Cardiovascular:     Pulses:          Femoral pulses are 2+ on the right side and 1+ on the left side.      Popliteal pulses are 0 on the right side and 0 on the left side.       Dorsalis pedis pulses are 0 on the right side  and 0 on the left side.       Posterior tibial pulses are 0 on the right side and 0 on the left side.  Pulmonary:     Effort: Pulmonary effort is normal.  Abdominal:     General: Abdomen is flat.     Palpations: Abdomen is soft. There is no mass.  Musculoskeletal:        General: Normal range of motion.     Right lower leg: Edema present.     Left lower leg: Edema present.  Skin:    General: Skin is warm.     Capillary Refill: Capillary refill takes 2 to 3 seconds.  Neurological:     General: No focal deficit present.     Mental Status: She is alert.  Psychiatric:        Mood and Affect: Mood normal.     Data: ABI Findings:  +---------+------------------+-----+----------+--------+  Right   Rt Pressure (mmHg)IndexWaveform  Comment   +---------+------------------+-----+----------+--------+  PTA     89                0.56 monophasic          +---------+------------------+-----+----------+--------+  DP      95                0.60 monophasic          +---------+------------------+-----+----------+--------+  Great Toe                       Absent              +---------+------------------+-----+----------+--------+   +---------+------------------+-----+----------+---------+  Left    Lt Pressure (mmHg)IndexWaveform  Comment    +---------+------------------+-----+----------+---------+  Brachial 159                                         +---------+------------------+-----+----------+---------+  PTA                            absent    inaudible  +---------+------------------+-----+----------+---------+  DP      92                0.58 monophasic           +---------+------------------+-----+----------+---------+  Great Toe64                0.40 Abnormal             +---------+------------------+-----+----------+---------+       Right arm BP not obtained per patient request, history of breast cancer.    Summary:  Right:  Resting right ankle-brachial index indicates moderate right lower  extremity arterial disease. The right toe-brachial index is abnormal.    Left: Resting left ankle-brachial index indicates moderate left lower  extremity arterial disease. The left toe-brachial index is abnormal.      Assessment/Plan:     68 year old female with multifactorial bilateral lower extremity pain with moderately depressed ABIs and toe pressures bilaterally.  We discussed that arterial insufficiency is 1 component of leg pain and that improving her blood flow may not resolve her symptoms.  Patient would like to proceed with attempts at revascularization of her left lower extremity given that is where her pain is worse.  I have recommended smoking cessation as well as gentle compression stockings.  We also discussed the need for cholesterol control with patient states she is not going to take other medications at this time.  She will continue Brilinta  and we will plan for angiography from a right common femoral approach on Monday in the near future.  We discussed the risk benefits alternatives as well as expected outcomes from the procedure and she demonstrates good understanding.  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 The patient is on best medical therapy for peripheral arterial disease. The patient has been counseled about the risks of tobacco use in atherosclerotic disease. The patient has been counseled to abstain from any tobacco use. An aortogram with bilateral lower extremity runoff angiography and Left lower extremity intervention and is indicated to better evaluate the patient's lower extremity circulation because of the  limb threatening nature of the patient's diagnosis. Based on the patient's clinical exam and non-invasive data, we anticipate an endovascular intervention in the iliac and femoropopliteal vessels. Stenting would be favored  because of the improved primary patency of these interventions as compared to plain balloon angioplasty.     Amy Lonni Colorado MD [facility]

## 2024-11-05 ENCOUNTER — Other Ambulatory Visit: Payer: Self-pay | Admitting: *Deleted

## 2024-11-05 ENCOUNTER — Telehealth: Payer: Self-pay | Admitting: Vascular Surgery

## 2024-11-05 DIAGNOSIS — I70223 Atherosclerosis of native arteries of extremities with rest pain, bilateral legs: Secondary | ICD-10-CM

## 2024-11-08 ENCOUNTER — Ambulatory Visit (HOSPITAL_COMMUNITY): Admission: RE | Admit: 2024-11-08 | Payer: Medicare (Managed Care) | Admitting: Vascular Surgery

## 2024-11-08 ENCOUNTER — Encounter (HOSPITAL_COMMUNITY): Admission: RE | Payer: Self-pay

## 2024-11-08 SURGERY — ABDOMINAL AORTOGRAM W/LOWER EXTREMITY
Anesthesia: LOCAL

## 2024-11-10 ENCOUNTER — Ambulatory Visit: Payer: Medicare (Managed Care) | Attending: Vascular Surgery | Admitting: Vascular Surgery

## 2024-11-10 ENCOUNTER — Ambulatory Visit (HOSPITAL_COMMUNITY): Payer: Medicare (Managed Care)

## 2024-11-10 ENCOUNTER — Ambulatory Visit (HOSPITAL_COMMUNITY): Admission: RE | Admit: 2024-11-10 | Payer: Medicare (Managed Care)

## 2024-11-17 NOTE — Progress Notes (Signed)
 Internal Medicine Clinic Attending  I was physically present during the key portions of the resident provided service and participated in the medical decision making of patient's management care. I reviewed pertinent patient test results.  The assessment, diagnosis, and plan were formulated together and I agree with the documentation in the resident's note.  Ms. Scinto is strong-willed and having difficulty understanding her chronic conditions.  Her children are appropriately worried about her.  As she declined cognitive evaluation, we cannot provide an assessment.  For now her family will continue to support her as best they can.   Trudy Mliss Dragon, MD

## 2024-12-04 ENCOUNTER — Emergency Department (HOSPITAL_BASED_OUTPATIENT_CLINIC_OR_DEPARTMENT_OTHER): Payer: Medicare (Managed Care)

## 2024-12-04 ENCOUNTER — Inpatient Hospital Stay (HOSPITAL_BASED_OUTPATIENT_CLINIC_OR_DEPARTMENT_OTHER)
Admission: EM | Admit: 2024-12-04 | Discharge: 2024-12-07 | DRG: 291 | Disposition: A | Discharge status: Home / Self Care | Payer: Medicare (Managed Care) | Attending: Internal Medicine | Admitting: Internal Medicine

## 2024-12-04 ENCOUNTER — Encounter (HOSPITAL_BASED_OUTPATIENT_CLINIC_OR_DEPARTMENT_OTHER): Payer: Self-pay

## 2024-12-04 ENCOUNTER — Other Ambulatory Visit: Payer: Self-pay

## 2024-12-04 DIAGNOSIS — R64 Cachexia: Secondary | ICD-10-CM | POA: Diagnosis present

## 2024-12-04 DIAGNOSIS — Z8249 Family history of ischemic heart disease and other diseases of the circulatory system: Secondary | ICD-10-CM | POA: Diagnosis not present

## 2024-12-04 DIAGNOSIS — F17200 Nicotine dependence, unspecified, uncomplicated: Secondary | ICD-10-CM | POA: Diagnosis not present

## 2024-12-04 DIAGNOSIS — E785 Hyperlipidemia, unspecified: Secondary | ICD-10-CM | POA: Diagnosis present

## 2024-12-04 DIAGNOSIS — D649 Anemia, unspecified: Secondary | ICD-10-CM | POA: Diagnosis present

## 2024-12-04 DIAGNOSIS — E1122 Type 2 diabetes mellitus with diabetic chronic kidney disease: Secondary | ICD-10-CM | POA: Diagnosis present

## 2024-12-04 DIAGNOSIS — J439 Emphysema, unspecified: Secondary | ICD-10-CM | POA: Diagnosis present

## 2024-12-04 DIAGNOSIS — Z91148 Patient's other noncompliance with medication regimen for other reason: Secondary | ICD-10-CM | POA: Diagnosis not present

## 2024-12-04 DIAGNOSIS — Z90710 Acquired absence of both cervix and uterus: Secondary | ICD-10-CM

## 2024-12-04 DIAGNOSIS — Z7901 Long term (current) use of anticoagulants: Secondary | ICD-10-CM | POA: Diagnosis not present

## 2024-12-04 DIAGNOSIS — Z7982 Long term (current) use of aspirin: Secondary | ICD-10-CM

## 2024-12-04 DIAGNOSIS — E1140 Type 2 diabetes mellitus with diabetic neuropathy, unspecified: Secondary | ICD-10-CM | POA: Diagnosis present

## 2024-12-04 DIAGNOSIS — F17210 Nicotine dependence, cigarettes, uncomplicated: Secondary | ICD-10-CM | POA: Diagnosis present

## 2024-12-04 DIAGNOSIS — G629 Polyneuropathy, unspecified: Secondary | ICD-10-CM | POA: Diagnosis not present

## 2024-12-04 DIAGNOSIS — H409 Unspecified glaucoma: Secondary | ICD-10-CM | POA: Diagnosis present

## 2024-12-04 DIAGNOSIS — R636 Underweight: Secondary | ICD-10-CM | POA: Diagnosis present

## 2024-12-04 DIAGNOSIS — D631 Anemia in chronic kidney disease: Secondary | ICD-10-CM | POA: Diagnosis present

## 2024-12-04 DIAGNOSIS — N1831 Chronic kidney disease, stage 3a: Secondary | ICD-10-CM | POA: Diagnosis present

## 2024-12-04 DIAGNOSIS — I700 Atherosclerosis of aorta: Secondary | ICD-10-CM | POA: Diagnosis present

## 2024-12-04 DIAGNOSIS — I5043 Acute on chronic combined systolic (congestive) and diastolic (congestive) heart failure: Secondary | ICD-10-CM | POA: Diagnosis present

## 2024-12-04 DIAGNOSIS — I2510 Atherosclerotic heart disease of native coronary artery without angina pectoris: Secondary | ICD-10-CM | POA: Diagnosis present

## 2024-12-04 DIAGNOSIS — I509 Heart failure, unspecified: Secondary | ICD-10-CM | POA: Diagnosis present

## 2024-12-04 DIAGNOSIS — Z79899 Other long term (current) drug therapy: Secondary | ICD-10-CM | POA: Diagnosis not present

## 2024-12-04 DIAGNOSIS — I5022 Chronic systolic (congestive) heart failure: Secondary | ICD-10-CM | POA: Diagnosis not present

## 2024-12-04 DIAGNOSIS — Z7902 Long term (current) use of antithrombotics/antiplatelets: Secondary | ICD-10-CM

## 2024-12-04 DIAGNOSIS — Z95810 Presence of automatic (implantable) cardiac defibrillator: Secondary | ICD-10-CM

## 2024-12-04 DIAGNOSIS — Z833 Family history of diabetes mellitus: Secondary | ICD-10-CM | POA: Diagnosis not present

## 2024-12-04 DIAGNOSIS — C50911 Malignant neoplasm of unspecified site of right female breast: Secondary | ICD-10-CM | POA: Diagnosis not present

## 2024-12-04 DIAGNOSIS — D509 Iron deficiency anemia, unspecified: Secondary | ICD-10-CM | POA: Diagnosis not present

## 2024-12-04 DIAGNOSIS — I2489 Other forms of acute ischemic heart disease: Secondary | ICD-10-CM | POA: Diagnosis present

## 2024-12-04 DIAGNOSIS — I5023 Acute on chronic systolic (congestive) heart failure: Secondary | ICD-10-CM | POA: Diagnosis not present

## 2024-12-04 DIAGNOSIS — Z8419 Family history of other disorders of kidney and ureter: Secondary | ICD-10-CM

## 2024-12-04 DIAGNOSIS — Z8673 Personal history of transient ischemic attack (TIA), and cerebral infarction without residual deficits: Secondary | ICD-10-CM

## 2024-12-04 DIAGNOSIS — Z91014 Allergy to mammalian meats: Secondary | ICD-10-CM

## 2024-12-04 DIAGNOSIS — E43 Unspecified severe protein-calorie malnutrition: Secondary | ICD-10-CM | POA: Diagnosis not present

## 2024-12-04 DIAGNOSIS — I130 Hypertensive heart and chronic kidney disease with heart failure and stage 1 through stage 4 chronic kidney disease, or unspecified chronic kidney disease: Principal | ICD-10-CM | POA: Diagnosis present

## 2024-12-04 DIAGNOSIS — Z9011 Acquired absence of right breast and nipple: Secondary | ICD-10-CM | POA: Diagnosis not present

## 2024-12-04 DIAGNOSIS — Z853 Personal history of malignant neoplasm of breast: Secondary | ICD-10-CM

## 2024-12-04 DIAGNOSIS — R911 Solitary pulmonary nodule: Secondary | ICD-10-CM | POA: Diagnosis present

## 2024-12-04 DIAGNOSIS — Z955 Presence of coronary angioplasty implant and graft: Secondary | ICD-10-CM

## 2024-12-04 DIAGNOSIS — Z681 Body mass index (BMI) 19 or less, adult: Secondary | ICD-10-CM

## 2024-12-04 DIAGNOSIS — Z888 Allergy status to other drugs, medicaments and biological substances status: Secondary | ICD-10-CM

## 2024-12-04 DIAGNOSIS — I5020 Unspecified systolic (congestive) heart failure: Secondary | ICD-10-CM | POA: Diagnosis not present

## 2024-12-04 LAB — BASIC METABOLIC PANEL WITH GFR
Anion gap: 10 (ref 5–15)
BUN: 36 mg/dL — ABNORMAL HIGH (ref 8–23)
CO2: 25 mmol/L (ref 22–32)
Calcium: 9.3 mg/dL (ref 8.9–10.3)
Chloride: 103 mmol/L (ref 98–111)
Creatinine, Ser: 1.27 mg/dL — ABNORMAL HIGH (ref 0.44–1.00)
GFR, Estimated: 45 mL/min — ABNORMAL LOW
Glucose, Bld: 120 mg/dL — ABNORMAL HIGH (ref 70–99)
Potassium: 4.5 mmol/L (ref 3.5–5.1)
Sodium: 138 mmol/L (ref 135–145)

## 2024-12-04 LAB — CBC WITH DIFFERENTIAL/PLATELET
Abs Immature Granulocytes: 0.02 K/uL (ref 0.00–0.07)
Basophils Absolute: 0 K/uL (ref 0.0–0.1)
Basophils Relative: 1 %
Eosinophils Absolute: 0.1 K/uL (ref 0.0–0.5)
Eosinophils Relative: 1 %
HCT: 32.5 % — ABNORMAL LOW (ref 36.0–46.0)
Hemoglobin: 9.6 g/dL — ABNORMAL LOW (ref 12.0–15.0)
Immature Granulocytes: 0 %
Lymphocytes Relative: 15 %
Lymphs Abs: 0.8 K/uL (ref 0.7–4.0)
MCH: 22.3 pg — ABNORMAL LOW (ref 26.0–34.0)
MCHC: 29.5 g/dL — ABNORMAL LOW (ref 30.0–36.0)
MCV: 75.6 fL — ABNORMAL LOW (ref 80.0–100.0)
Monocytes Absolute: 0.5 K/uL (ref 0.1–1.0)
Monocytes Relative: 9 %
Neutro Abs: 3.9 K/uL (ref 1.7–7.7)
Neutrophils Relative %: 74 %
Platelets: 367 K/uL (ref 150–400)
RBC: 4.3 MIL/uL (ref 3.87–5.11)
RDW: 18.6 % — ABNORMAL HIGH (ref 11.5–15.5)
WBC: 5.3 K/uL (ref 4.0–10.5)
nRBC: 0 % (ref 0.0–0.2)

## 2024-12-04 LAB — PRO BRAIN NATRIURETIC PEPTIDE: Pro Brain Natriuretic Peptide: 9629 pg/mL — ABNORMAL HIGH

## 2024-12-04 LAB — TROPONIN T, HIGH SENSITIVITY: Troponin T High Sensitivity: 35 ng/L — ABNORMAL HIGH (ref 0–19)

## 2024-12-04 MED ORDER — CARVEDILOL 6.25 MG PO TABS
6.2500 mg | ORAL_TABLET | Freq: Every day | ORAL | Status: DC
  Filled 2024-12-04: qty 1

## 2024-12-04 MED ORDER — CETAPHIL MOISTURIZING EX LOTN: TOPICAL_LOTION | CUTANEOUS | Status: DC | PRN

## 2024-12-04 MED ORDER — ADULT MULTIVITAMIN W/MINERALS CH
1.0000 | ORAL_TABLET | Freq: Every day | ORAL | Status: DC
  Administered 2024-12-05: 1 via ORAL
  Filled 2024-12-04 (×3): qty 1

## 2024-12-04 MED ORDER — ASPIRIN 81 MG PO CHEW
81.0000 mg | CHEWABLE_TABLET | Freq: Every day | ORAL | Status: DC
  Administered 2024-12-06: 81 mg via ORAL
  Filled 2024-12-04 (×2): qty 1

## 2024-12-04 MED ORDER — LATANOPROST 0.005 % OP SOLN
1.0000 [drp] | Freq: Every day | OPHTHALMIC | Status: DC
  Administered 2024-12-04: 1 [drp] via OPHTHALMIC

## 2024-12-04 MED ORDER — CARVEDILOL 12.5 MG PO TABS
12.5000 mg | ORAL_TABLET | Freq: Every day | ORAL | Status: DC
  Filled 2024-12-04: qty 1

## 2024-12-04 MED ORDER — NICOTINE 7 MG/24HR TD PT24: 7.0000 mg | MEDICATED_PATCH | Freq: Every day | TRANSDERMAL | Status: DC | PRN

## 2024-12-04 MED ORDER — TICAGRELOR 90 MG PO TABS
90.0000 mg | ORAL_TABLET | Freq: Two times a day (BID) | ORAL | Status: DC
  Administered 2024-12-04 – 2024-12-05 (×2): 90 mg via ORAL
  Filled 2024-12-04 (×3): qty 1

## 2024-12-04 MED ORDER — BRIMONIDINE TARTRATE 0.2 % OP SOLN: 1.0000 [drp] | Freq: Every evening | OPHTHALMIC | Status: DC | PRN

## 2024-12-04 MED ORDER — ENSURE ENLIVE PO LIQD: 237.0000 mL | Freq: Every day | ORAL | Status: DC | PRN

## 2024-12-04 MED ORDER — DORZOLAMIDE HCL 2 % OP SOLN
1.0000 [drp] | Freq: Two times a day (BID) | OPHTHALMIC | Status: DC
  Administered 2024-12-04: 1 [drp] via OPHTHALMIC

## 2024-12-04 MED ORDER — FUROSEMIDE 10 MG/ML IJ SOLN
80.0000 mg | Freq: Once | INTRAMUSCULAR | Status: AC
  Administered 2024-12-04: 80 mg via INTRAVENOUS
  Filled 2024-12-04: qty 8

## 2024-12-04 MED ORDER — RIVAROXABAN 10 MG PO TABS
10.0000 mg | ORAL_TABLET | Freq: Every day | ORAL | Status: DC
  Filled 2024-12-04 (×3): qty 1

## 2024-12-04 MED ORDER — TIMOLOL MALEATE 0.5 % OP SOLN
1.0000 [drp] | Freq: Two times a day (BID) | OPHTHALMIC | Status: DC
  Administered 2024-12-04 – 2024-12-06 (×4): 1 [drp] via OPHTHALMIC
  Filled 2024-12-04: qty 5

## 2024-12-04 NOTE — H&P (Signed)
 " Date: 12/04/2024               Patient Name:  Amy Howell MRN: [id number]  DOB: 01-01-1956 Age / Sex: 69 y.o., female   PCP: Marylu Gee, DO         Medical Service: Internal Medicine Teaching Service         Attending Physician: Dr. Jone Dauphin      First Contact: Alfornia Light, DO    Second Contact: Dr. Toma Edwards, DO         Pager Information: First Contact Pager: [phone]   Second Contact Pager: [phone]   SUBJECTIVE   Chief Complaint: bilateral leg swelling   History of Present Illness: Amy Howell is a 69 y.o. female with PMH of HFrEF (12/24/2023: EF 35-40%), hypertension, history of smoking, medication nonadherence, hyperlipidemia, severe protein calorie malnutrition, CAD s/p RCA stent (on Brilinta  and ASA), chronic anemia, CKD stage IIIa, glaucoma both eyes, primary malignant neoplasm right breast s/p right mastectomy (declined recommended chemotherapy 3), history of stroke 2024 (no residual deficits), peripheral neuropathy.  Patient presents as transfer from [facility].  Patient provides history, however some of her current symptom history is hard to follow she was interested in discussing her concerns with previous medications, previous diagnoses, and previous doctors.   Chief complaint is the lower extremity swelling.  She states that lower extremity swelling and needing to sleep in chair due to shortness of breath with lying down has been occurring since January.  Her daughter, who she lives with and helps with her medications, encouraged her to go to the ED due to worsening edema over the past couple of days/weeks. She has had no chest pain or shortness of breath.  She said that her both of her legs looked much worse at [facility], but now during this encounter she said that it has gotten much better but still not at baseline.  She states that doctors are hard to trust because they are prescribing medications which make her feel poorly such as other GDMT.  She states  adherence to her Brilinta , aspirin , carvedilol , ophthalmic drops in addition to trying other holistic medications such as sea grass and teas which helped her feel better.  Further denies: Fever, chills, cough, abdominal pain, constipation, diarrhea, dysuria, increased urinary frequency Endorses: Left lower extremity >right lower extremity swelling, green sputum production after brushing teeth  ED Course: Labs significant for  - WBC 5.3 - Potassium 4.5 - Creatinine 1.27 (baseline appears to be around 1.06) - proBNP 9629 - Troponin 35 Imaging  - Chest x-ray: Cardiomegaly without overt failure, emphysema without focal airspace disease Received Lasix  Consulted internal medicine teaching service  Past Medical History PCP: Marylu Gee, DO  HFrEF (12/24/2023: EF 35-40%), hypertension, history of smoking, medication nonadherence, hyperlipidemia, severe protein calorie malnutrition, CAD s/p RCA stent (on Brilinta  and ASA), chronic anemia, CKD stage IIIa, glaucoma both eyes, primary malignant neoplasm right breast s/p right mastectomy (declined recommended chemotherapy 3), history of stroke 2024 (Right MCA, no residual deficits), peripheral neuropathy, incidental lung nodule found in 2024 (resolved)  Meds: Patient reported:  Brilinta  90 mg twice daily Aspirin  81 mg daily Carvedilol  12.5 mg morning Carvedilol  6.25 mg bedtime Timolol  ophthalmic drops Xalantan ophthalmic drops Trusopt  Ophthalmic Drops Alphagan  ophthalmic drops Ensures  Active Medications[1]  Past Surgical History Past Surgical History:  Procedure Laterality Date   ABDOMINAL HYSTERECTOMY     CARDIAC DEFIBRILLATOR PLACEMENT     CORONARY STENT INTERVENTION N/A 11/26/2022   Procedure: CORONARY STENT  INTERVENTION;  Surgeon: Dann Candyce RAMAN, MD;  Location: [facility] INVASIVE CV LAB;  Service: Cardiovascular;  Laterality: N/A;   gum operation     Implantable loop recorder removal  02/01/2019   MDT Reveal LINQ explantation in  office by JA   IR 3D INDEPENDENT WKST  05/07/2017   IR 3D INDEPENDENT WKST  09/17/2023   IR ANGIO INTRA EXTRACRAN SEL COM CAROTID INNOMINATE BILAT MOD SED  04/23/2017   IR ANGIO INTRA EXTRACRAN SEL COM CAROTID INNOMINATE BILAT MOD SED  08/06/2017   IR ANGIO INTRA EXTRACRAN SEL COM CAROTID INNOMINATE UNI R MOD SED  05/07/2017   IR ANGIO INTRA EXTRACRAN SEL INTERNAL CAROTID BILAT MOD SED  09/17/2023   IR ANGIO VERTEBRAL SEL VERTEBRAL BILAT MOD SED  04/23/2017   IR ANGIO VERTEBRAL SEL VERTEBRAL BILAT MOD SED  08/06/2017   IR ANGIOGRAM FOLLOW UP STUDY  05/07/2017   IR ANGIOGRAM SELECTIVE EACH ADDITIONAL VESSEL  05/07/2017   IR RADIOLOGIST EVAL & MGMT  04/16/2017   IR RADIOLOGIST EVAL & MGMT  05/29/2017   IR RADIOLOGIST EVAL & MGMT  09/29/2023   IR RADIOLOGIST EVAL & MGMT  11/05/2023   IR TRANSCATH/EMBOLIZ  05/07/2017   LOOP RECORDER INSERTION N/A 03/28/2017   Procedure: Loop Recorder Insertion;  Surgeon: Lynwood Rakers, MD;  Location: [facility] INVASIVE CV LAB;  Service: Cardiovascular;  Laterality: N/A;   MASTECTOMY W/ SENTINEL NODE BIOPSY Right 06/25/2022   Procedure: RIGHT MASTECTOMY WITH AXILLARY SENTINEL LYMPH NODE BIOPSY;  Surgeon: Belinda Cough, MD;  Location: [facility];  Service: General;  Laterality: Right;   RADIOLOGY WITH ANESTHESIA N/A 05/07/2017   Procedure: EMBOLIZATION;  Surgeon: Dolphus Carrion, MD;  Location: [facility] OR;  Service: Radiology;  Laterality: N/A;   RADIOLOGY WITH ANESTHESIA N/A 08/06/2017   Procedure: RADIOLOGY WITH ANESTHESIA STENTING;  Surgeon: Dolphus Carrion, MD;  Location: [facility] OR;  Service: Radiology;  Laterality: N/A;   RIGHT/LEFT HEART CATH AND CORONARY ANGIOGRAPHY N/A 11/26/2022   Procedure: RIGHT/LEFT HEART CATH AND CORONARY ANGIOGRAPHY;  Surgeon: Dann Candyce RAMAN, MD;  Location: [facility] INVASIVE CV LAB;  Service: Cardiovascular;  Laterality: N/A;   TEE WITHOUT CARDIOVERSION N/A 03/28/2017   Procedure: TRANSESOPHAGEAL ECHOCARDIOGRAM (TEE);  Surgeon: Redell RAMAN Shallow, MD;   Location: [facility] ENDOSCOPY;  Service: Cardiovascular;  Laterality: N/A;    Social:  Lives With: Daughter Occupation: Retired Support: Daughter Level of Function: Independent in ADLs and IADLs.  Wears depends due to concern with diuretics.  Has walker and privately purchased wheelchair.  Sleeps in a recliner due to orthopnea. Substances: -Tobacco: 1-1/2 cigarettes daily.  Has been smoking since 69 years old.  At most, with still smoke less than a pack a day. -Alcohol: Denies -Recreational Drug: Denies  Family History:  Family History  Problem Relation Age of Onset   Dementia Mother    Kidney disease Father    Hypertension Father    Diabetes Father    Heart disease Father    Lung cancer Maternal Aunt        dx after 54   Prostate cancer Other        MGM's brother   Breast cancer Neg Hx    Colon cancer Neg Hx    Liver disease Neg Hx    Esophageal cancer Neg Hx      Allergies: Allergies as of 12/04/2024 - Review Complete 12/04/2024  Allergen Reaction Noted   Bidil  [isosorb dinitrate-hydralazine ] Other (See Comments) 09/24/2023   Chlorhexidine  Dermatitis 12/24/2023   Lisinopril  Other (See  Comments) 03/23/2020   Porcine (pork) protein-containing drug products Hives 11/26/2022   Rosuvastatin  Other (See Comments) 03/17/2023   Statins Other (See Comments) 12/01/2023    Review of Systems: A complete ROS was negative except as per HPI.   OBJECTIVE:   Physical Exam: Blood pressure (!) 174/82, pulse 94, temperature 98 F (36.7 C), temperature source Oral, resp. rate (!) 28, SpO2 100%.  Physical Exam Constitutional:      General: She is not in acute distress.    Appearance: She is not diaphoretic.     Comments: Cachectic, chronically ill-appearing female.  Wet and warm  HENT:     Mouth/Throat:     Mouth: Mucous membranes are moist.  Eyes:     Conjunctiva/sclera: Conjunctivae normal.  Cardiovascular:     Rate and Rhythm: Regular rhythm. Tachycardia present.     Heart  sounds: Normal heart sounds. No murmur heard.    No friction rub. No gallop.     Comments: Symmetrical lower extremity edema present from the feet to above knees bilaterally.  No hip edema present. Pulmonary:     Effort: Pulmonary effort is normal. No respiratory distress.     Breath sounds: Normal breath sounds. No stridor. No wheezing, rhonchi or rales.     Comments: Saturating 98% on room air, speaking in full complete sentences without breaks. Chest:     Comments: S/p right mastectomy Abdominal:     General: Bowel sounds are normal.     Palpations: Abdomen is soft.     Tenderness: There is no abdominal tenderness.  Musculoskeletal:     Right lower leg: 2+ Pitting Edema present.     Left lower leg: 2+ Pitting Edema present.  Feet:     Right foot:     Skin integrity: Skin integrity normal.     Left foot:     Skin integrity: Skin integrity normal.     Comments: Unable to palpate pulses, however capillary refill normal, feet were warm, appeared well-perfused. Skin:    General: Skin is warm and dry.  Neurological:     Mental Status: She is alert.      Labs: CBC    Component Value Date/Time   WBC 5.3 12/04/2024 1425   RBC 4.30 12/04/2024 1425   HGB 9.6 (L) 12/04/2024 1425   HGB 10.9 (L) 07/09/2024 1140   HCT 32.5 (L) 12/04/2024 1425   HCT 36.0 07/09/2024 1140   PLT 367 12/04/2024 1425   PLT 249 07/09/2024 1140   MCV 75.6 (L) 12/04/2024 1425   MCV 99 (H) 07/09/2024 1140   MCH 22.3 (L) 12/04/2024 1425   MCHC 29.5 (L) 12/04/2024 1425   RDW 18.6 (H) 12/04/2024 1425   RDW 13.0 07/09/2024 1140   LYMPHSABS 0.8 12/04/2024 1425   LYMPHSABS 1.7 06/28/2021 1525   MONOABS 0.5 12/04/2024 1425   EOSABS 0.1 12/04/2024 1425   EOSABS 0.2 06/28/2021 1525   BASOSABS 0.0 12/04/2024 1425   BASOSABS 0.0 06/28/2021 1525     CMP     Component Value Date/Time   NA 138 12/04/2024 1425   NA 139 09/14/2024 1038   K 4.5 12/04/2024 1425   CL 103 12/04/2024 1425   CO2 25 12/04/2024 1425    GLUCOSE 120 (H) 12/04/2024 1425   BUN 36 (H) 12/04/2024 1425   BUN 28 (H) 09/14/2024 1038   CREATININE 1.27 (H) 12/04/2024 1425   CREATININE 1.21 (H) 06/12/2022 0807   CALCIUM  9.3 12/04/2024 1425   PROT 5.7 (L) 11/30/2023  1232   PROT 7.3 06/30/2023 0805   ALBUMIN 2.5 (L) 12/01/2023 0440   ALBUMIN 4.1 06/30/2023 0805   AST 41 11/30/2023 1232   AST 17 06/12/2022 0807   ALT 45 (H) 11/30/2023 1232   ALT 15 06/12/2022 0807   ALKPHOS 117 11/30/2023 1232   BILITOT 0.6 11/30/2023 1232   BILITOT 0.2 06/30/2023 0805   BILITOT 0.4 06/12/2022 0807   GFRNONAA 45 (L) 12/04/2024 1425   GFRNONAA 49 (L) 06/12/2022 0807   GFRAA 68 07/15/2019 1103    Imaging: DG Chest Portable 1 View Result Date: 12/04/2024 CLINICAL DATA:  Shortness of breath EXAM: PORTABLE CHEST 1 VIEW COMPARISON:  12/23/2023, CT 10/01/2024 FINDINGS: Cardiomegaly with aortic atherosclerosis. Emphysema. No focal opacity, pleural effusion, or pneumothorax. Linear atelectasis in the left mid lung. IMPRESSION: 1. Cardiomegaly without overt failure 2. Emphysema without focal airspace disease. Electronically Signed   By: Luke Bun M.D.   On: 12/04/2024 16:09     EKG: personally reviewed my interpretation is sinus rhythm with by atrial enlargement. Prior EKG sinus rhythm with left ventricular hypertrophy.  ASSESSMENT & PLAN:   Assessment & Plan by Problem: Principal Problem:   CHF (congestive heart failure) (HCC)   Amy Howell is a 69 y.o. person living with a history of HFrEF (12/24/2023: EF 35-40%), hypertension, history of smoking, medication nonadherence, hyperlipidemia, severe protein calorie malnutrition, CAD s/p RCA stent (on Brilinta  and ASA) incidental lung nodule 2024, chronic anemia, CKD stage IIIa, glaucoma both eyes, primary malignant neoplasm right breast s/p right mastectomy (declined recommended chemotherapy 3), history of stroke, peripheral neuropathy. who presented with bilateral lower extremity edema, transfer  from [facility] and admitted for heart failure exacerbation on hospital day 0  Heart failure exacerbation Chronic heart failure with reduced ejection fraction (HFrEF <40%) Medication non adherence Transferred from [facility].  Presents with lower extremity swelling and complaints of orthopnea. Last hospitalization for congestive heart failure was 12/23/2023. She has had long discussion with primary care providers regarding disease process and reasoning for fluid buildup.  Current medication management includes Coreg  12.5 mg a.m. and 6.25 mg nightly as well as torsemide  10 mg Monday Wednesday and Fridays.  There have been extensive conversations in the past regarding GDMT, however she believes that those medications make her condition worse (very adamant against Jardiance  due to lethargy).  Initial labs include pro BNP 9629.  She has been nonadherent to her torsemide  (has not been filled in 6 months). Hypervolemic on physical exam with symmetrical lower extremity edema (lungs clear).  Unknown dry weight (she is the same documented weight as clinic visit 10/01/2024 at 34.1 kg). No signs of infection, no complaints of chest pain and well score 1.5, patient is a smoker but no history of COPD/inhaler use.  Patient is chronically ill and cachectic appearing, but from a heart failure exacerbation standpoint, she said that she has improved with the Lasix  that she received at [facility].  She is eager to go home before her birthday on Monday.  Will continue to diurese and monitor electrolytes with goal to decrease lower extremity swelling/symptoms.  With patient's history of CAD and hypertension with medication nonadherence, suspicion for worsening heart failure/EF. - Lasix  IV 80 mg - Replete electrolytes as needed (K >4, Mg >2) - Strict ins and outs - Daily weights - Repeat complete echo  Tachycardia Heart rate between 90-102.  Takes carvedilol  12.5 mg in the morning and 6.75 mg in the evening. - Carvedilol  due  to be administered at 2200 -  Continue to monitor  Hypertension CAD s/p RCA stent (11/2022) Patient has been seen in the internal medicine teaching service clinic several times the past couple months, blood pressures have ranged from normotensive to hypertensive.  Admission blood pressures ranging from systolic 150-177.  Certainly history of CAD and uncontrolled hypertension can be worsening heart failure as stated above. - Continue aspirin  81 mg daily - Continue Brilinta  90 mg twice daily - Continue carvedilol  12.5 mg a.m. and 6.75 mg evening  Elevated creatinine History of CKD 3 a Patient has history of CKD 3 AA, GFR 45.  Creatinine appears to be elevated at 1.27 which is above suspected baseline of 1.06.  Last BMP 09/14/2024 1.18.  With aggressive diuresis, continue to monitor. - Continue to monitor BMP  Elevated troponin Troponin 35.  No complaints of chest pain.  EKG in sinus rhythm with biatrial enlargement. - If complaining of chest pain, repeat troponin and EKG  Severe protein calorie malnutrition Patient is chronically ill appearing and cachectic.  Current weight 34.1 kg.  Appears stable from 09/2024.  Previously on mirtazapine .   - Consult to registered dietitian ordered - PT/OT ordered - Ensures ordered - Continue follow-up outpatient  Chronic microcytic anemia Hemoglobin 9.6, HCT 32.5, MCV 75.6, RDW 18.6.  Iron anemia panel 12/21/2022: Iron 35, TIBC 325, percent saturation 11, ferritin 30.  Folate 33.1.  B12 10/01/2024 544.  Patient is not taking iron supplementation. Referral sent at clinic appointment 09/14/2024 for [facility] to set up colonoscopy  - Consider iron supplementation outpatient  Peripheral neuropathy Patient stated that she has been having constant pain described as electricity in her ankles.  At office visit 10/01/2024 she believes this is due to the carvedilol .  No lesions or abnormalities were noted on bilateral feet on physical exam.  B12 and TSH WNL.   Outpatient workup suspects PAD due to ABIs indicating moderate bilateral lower extremity arterial disease with abnormal toe-brachial indices.  Referral to vascular surgery was placed.  Patient no-showed her appointment 11/10/2024. - Consider repeat referral outpatient  #Chronic medical conditions Tobacco Use Disorder  - Nicotine  patch ordered PRN Hyperlipidemia - Last lipid panel 09/16/2023: Cholesterol 141, HDL 32, TG 90, VLDL 18, LDL 91.  LDL goal post CVA would be <70. - Consider conversation around dietary changes to reduce cholesterol. Glaucoma both eyes - home eye drops ordered Primary malignant neoplasm right breast s/p right mastectomy (declined recommended chemotherapy 3) History of stroke (Right MCA stroke 2024) vs Aneurysm  - occurred while on Brilinta  and ASA Incidental lung nodule 2024 -CT chest 10/01/2024: Positive for new nodular airspace disease in right middle lobe, resolution of 5 mm subpleural nodular consolidation.  Pulmonary emphysema present.  Aortic atherosclerosis.   Best practice: Diet: Heart Healthy VTE: Xarelto  IVF: None,None Code: Full  Disposition planning: Prior to Admission Living Arrangement: Home, living with daughter Anticipated Discharge Location: Home  Dispo: Admit patient to Observation with expected length of stay less than 2 midnights.  Signed: Benuel Braun, DO Internal Medicine Resident  12/04/2024, 9:14 PM  On Call pager: [phone]     [1]  No outpatient medications have been marked as taking for the 12/04/24 encounter [facility] Encounter).   "

## 2024-12-04 NOTE — ED Triage Notes (Signed)
 Patient reports having increased lower leg swelling bilaterally. She says she used to take a pill to make her use the bathroom. Assuming this is a dieretic. She says she is no longer taking it. Now she has swelling.

## 2024-12-04 NOTE — ED Notes (Signed)
 [facility] in ED preparing pt for transfer

## 2024-12-04 NOTE — ED Provider Notes (Signed)
 " [place] EMERGENCY DEPARTMENT AT [facility] Provider Note   CSN: [id number] Arrival date & time: 12/04/24  1320     Patient presents with: Leg Swelling   Amy Howell is a 69 y.o. female.   Patient here with shortness of breath with exertion and leg swelling.  She takes torsemide  a couple times a week for this.  But she has not been taking her medicines per family member.  She denies any chest pain fever chills cough sputum production.  History of hypertension diabetes.  She denies any weakness numbness or tingling.  Nothing makes it worse or better.  She cannot really lie flat at night.  She cannot walk far distances without feeling short of breath.  The history is provided by the patient.       Prior to Admission medications  Medication Sig Start Date End Date Taking? Authorizing Provider  aspirin  81 MG chewable tablet Chew 1 tablet (81 mg total) by mouth daily. 11/28/22   Danford, Lonni SQUIBB, MD  brimonidine  (ALPHAGAN ) 0.2 % ophthalmic solution Place 1 drop into both eyes at bedtime as needed.    [provider]  calamine lotion Apply 1 Application topically daily as needed for itching.    [provider]  carvedilol  (COREG ) 12.5 MG tablet Take 1 tablet (12.5 mg total) by mouth 2 (two) times daily with a meal. Patient taking differently: Take 6.25-12.5 mg by mouth See admin instructions. 12.5mg  in the morning and 6.25mg  at night 09/01/24   Marylu Gee, DO  dorzolamide  (TRUSOPT ) 2 % ophthalmic solution Place 1 drop into both eyes 2 (two) times daily. Patient taking differently: Place 1 drop into both eyes daily. 03/05/24     feeding supplement (ENSURE ENLIVE / ENSURE PLUS) LIQD Take 237 mLs by mouth 3 (three) times daily between meals. Patient taking differently: Take 237 mLs by mouth daily as needed. 09/18/23   Fernand Prost, MD  Hydrocortisone  (CORTIZONE-10 EX) Apply 1 Application topically daily as needed (itching).    [provider]   latanoprost  (XALATAN ) 0.005 % ophthalmic solution Instill 1 drop into both eyes at bedtime 02/19/22     Multiple Vitamin (MULTIVITAMIN WITH MINERALS) TABS tablet Take 1 tablet by mouth daily. Patient not taking: Reported on 01/08/2024 09/19/23   Fernand Prost, MD  ticagrelor  (BRILINTA ) 90 MG TABS tablet Take 1 tablet (90 mg total) by mouth 2 (two) times daily. 12/11/23   Zheng, Michael, DO  timolol  (TIMOPTIC ) 0.5 % ophthalmic solution Place 1 drop into both eyes 2 (two) times daily. 09/13/24     torsemide  (DEMADEX ) 20 MG tablet Take 1 tablet (20 mg total) by mouth daily. Patient taking differently: Take 10-20 mg by mouth 3 (three) times a week. 03/18/24   Elicia Sharper, DO    Allergies: Bidil  [isosorb dinitrate-hydralazine ], Chlorhexidine , Lisinopril , Porcine (pork) protein-containing drug products, Rosuvastatin , and Statins    Review of Systems  Updated Vital Signs BP (!) 159/78   Pulse 93   Temp 98.1 F (36.7 C)   Resp 15   SpO2 99%   Physical Exam Vitals and nursing note reviewed.  Constitutional:      General: She is not in acute distress.    Appearance: She is well-developed.  HENT:     Head: Normocephalic and atraumatic.     Nose: Nose normal.     Mouth/Throat:     Mouth: Mucous membranes are moist.  Eyes:     Conjunctiva/sclera: Conjunctivae normal.     Pupils: Pupils are  equal, round, and reactive to light.  Cardiovascular:     Rate and Rhythm: Normal rate and regular rhythm.     Pulses: Normal pulses.     Heart sounds: No murmur heard. Pulmonary:     Effort: Pulmonary effort is normal. No respiratory distress.     Breath sounds: Normal breath sounds.  Abdominal:     Palpations: Abdomen is soft.     Tenderness: There is no abdominal tenderness.  Musculoskeletal:        General: No swelling.     Cervical back: Neck supple.     Right lower leg: Edema present.     Left lower leg: Edema present.  Skin:    General: Skin is warm and dry.     Capillary Refill:  Capillary refill takes less than 2 seconds.  Neurological:     Mental Status: She is alert.  Psychiatric:        Mood and Affect: Mood normal.     (all labs ordered are listed, but only abnormal results are displayed) Labs Reviewed  CBC WITH DIFFERENTIAL/PLATELET - Abnormal; Notable for the following components:      Result Value   Hemoglobin 9.6 (*)    HCT 32.5 (*)    MCV 75.6 (*)    MCH 22.3 (*)    MCHC 29.5 (*)    RDW 18.6 (*)    All other components within normal limits  BASIC METABOLIC PANEL WITH GFR - Abnormal; Notable for the following components:   Glucose, Bld 120 (*)    BUN 36 (*)    Creatinine, Ser 1.27 (*)    GFR, Estimated 45 (*)    All other components within normal limits  PRO BRAIN NATRIURETIC PEPTIDE - Abnormal; Notable for the following components:   Pro Brain Natriuretic Peptide 9,629.0 (*)    All other components within normal limits  TROPONIN T, HIGH SENSITIVITY - Abnormal; Notable for the following components:   Troponin T High Sensitivity 35 (*)    All other components within normal limits    EKG: EKG Interpretation Date/Time:  Saturday December 04 2024 14:35:53 EST Ventricular Rate:  90 PR Interval:  144 QRS Duration:  84 QT Interval:  376 QTC Calculation: 461 R Axis:   81  Text Interpretation: Sinus rhythm Biatrial enlargement Confirmed by Ruthe Cornet [phone]) on 12/04/2024 3:09:41 PM  Radiology: No results found.   Procedures   Medications Ordered in the ED  furosemide  (LASIX ) injection 80 mg (80 mg Intravenous Given 12/04/24 1555)                                    Medical Decision Making Amount and/or Complexity of Data Reviewed Labs: ordered. Radiology: ordered.  Risk Prescription drug management.   Amy Howell is here with shortness of breath and leg swelling.  Normal vitals.  No fever.  Patient has history of heart failure.  She looks volume overloaded on exam.  Significant pitting edema in both legs bilaterally.  Some  mild JVD.  But normal work of breathing.  Does not sound like she has been compliant with her medications including her torsemide .  Her echocardiogram last year showed an EF of around 3540%.  Differential diagnosis likely volume overload seems less likely to be ACS or infectious process.  I have no concern for PE at this time.  Will get CBC BMP troponin proBNP chest x-ray.  EKG done  in triage shows sinus rhythm.  No ischemic changes.  proBNP is about 10,000.  Troponin is 35.  Lab work otherwise at baseline.  Chest x-ray shows no obvious pneumonia but does show enlarged heart may be some pulmonary edema.  Overall I do think patient has heart failure exacerbation likely due to noncompliance.  Will give her a dose of IV Lasix  here and admit her for further supportive care and diuresis.  This chart was dictated using voice recognition software.  Despite best efforts to proofread,  errors can occur which can change the documentation meaning.      Final diagnoses:  Acute exacerbation of chronic heart failure [facility])    ED Discharge Orders     None          Ruthe Cornet, DO 12/04/24 1609  "

## 2024-12-04 NOTE — Hospital Course (Addendum)
 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  Medications: Aspirin  81 mg Brimonidine  0.2% ophthalmic solution Coreg  12.5 mg in the morning and 6.25 mg at night Dorzolamide  1 drop in both eyes daily Ensure as needed Latanoprost  0.005% Brilinta  90 mg twice daily Timolol  1 drop in both eyes twice daily Torsemide  ***   Acute HFrEF exacerbation  Presented with ____________. BNP elevated to _____. Chest X ray showed ______. ECHO showed LVEF _____. Treated with Diuresis ___ with Net Negative of ____ during this admission.   Etiologies: medication noncompliance Per chart review, patient had declined taking Jardiance  and Aldactone  because she thought it caused her shortness of breath.  She stopped taking Toprol  after 1 day because she believed it caused her significant SOB/lethargy.  - ECHO  - RVP panel  - Continue to monitor daily weights, strict I/Os, and renal function. - Goal Mag > 2.0 and K > 4.0  - Daily RFP   Peripheral arterial disease ABI on 10/13/2024 shows moderate arterial disease of bilateral lower extremities and abnormal TBI's.  Patient was evaluated by [facility], with plans for aortogram with bilateral lower extremity runoff angiogram and LLE intervention, however the procedure was canceled on 11/08/2024.    Chronic anemia  CKD stage IIIa History of proteinuria     I cannot follow her medication story Feet swelling, left more? Sleeping in chair, orthopnea, chronic though,  Swelling started with new pill??    Daughter helps with pill organizer  Call daughter?? I dont think so  Smoked a bit in the past but  now just 1.5 cigarettes a day

## 2024-12-05 DIAGNOSIS — N1831 Chronic kidney disease, stage 3a: Secondary | ICD-10-CM | POA: Diagnosis not present

## 2024-12-05 DIAGNOSIS — I2510 Atherosclerotic heart disease of native coronary artery without angina pectoris: Secondary | ICD-10-CM | POA: Diagnosis not present

## 2024-12-05 DIAGNOSIS — Z7901 Long term (current) use of anticoagulants: Secondary | ICD-10-CM | POA: Diagnosis not present

## 2024-12-05 DIAGNOSIS — Z91148 Patient's other noncompliance with medication regimen for other reason: Secondary | ICD-10-CM | POA: Diagnosis not present

## 2024-12-05 DIAGNOSIS — Z955 Presence of coronary angioplasty implant and graft: Secondary | ICD-10-CM

## 2024-12-05 DIAGNOSIS — E1140 Type 2 diabetes mellitus with diabetic neuropathy, unspecified: Secondary | ICD-10-CM

## 2024-12-05 DIAGNOSIS — E1122 Type 2 diabetes mellitus with diabetic chronic kidney disease: Secondary | ICD-10-CM

## 2024-12-05 DIAGNOSIS — I5020 Unspecified systolic (congestive) heart failure: Secondary | ICD-10-CM | POA: Diagnosis not present

## 2024-12-05 DIAGNOSIS — Z7982 Long term (current) use of aspirin: Secondary | ICD-10-CM | POA: Diagnosis not present

## 2024-12-05 DIAGNOSIS — I130 Hypertensive heart and chronic kidney disease with heart failure and stage 1 through stage 4 chronic kidney disease, or unspecified chronic kidney disease: Secondary | ICD-10-CM

## 2024-12-05 DIAGNOSIS — D631 Anemia in chronic kidney disease: Secondary | ICD-10-CM

## 2024-12-05 LAB — IRON AND TIBC
Iron: 66 ug/dL (ref 28–170)
Saturation Ratios: 13 % (ref 10.4–31.8)
TIBC: 517 ug/dL — ABNORMAL HIGH (ref 250–450)
UIBC: 450 ug/dL

## 2024-12-05 LAB — CBC
HCT: 29.8 % — ABNORMAL LOW (ref 36.0–46.0)
Hemoglobin: 9 g/dL — ABNORMAL LOW (ref 12.0–15.0)
MCH: 22.5 pg — ABNORMAL LOW (ref 26.0–34.0)
MCHC: 30.2 g/dL (ref 30.0–36.0)
MCV: 74.5 fL — ABNORMAL LOW (ref 80.0–100.0)
Platelets: 361 K/uL (ref 150–400)
RBC: 4 MIL/uL (ref 3.87–5.11)
RDW: 18.5 % — ABNORMAL HIGH (ref 11.5–15.5)
WBC: 7.3 K/uL (ref 4.0–10.5)
nRBC: 0 % (ref 0.0–0.2)

## 2024-12-05 LAB — BASIC METABOLIC PANEL WITH GFR
Anion gap: 10 (ref 5–15)
BUN: 35 mg/dL — ABNORMAL HIGH (ref 8–23)
CO2: 27 mmol/L (ref 22–32)
Calcium: 8.8 mg/dL — ABNORMAL LOW (ref 8.9–10.3)
Chloride: 101 mmol/L (ref 98–111)
Creatinine, Ser: 1.23 mg/dL — ABNORMAL HIGH (ref 0.44–1.00)
GFR, Estimated: 47 mL/min — ABNORMAL LOW
Glucose, Bld: 134 mg/dL — ABNORMAL HIGH (ref 70–99)
Potassium: 4.1 mmol/L (ref 3.5–5.1)
Sodium: 138 mmol/L (ref 135–145)

## 2024-12-05 LAB — FERRITIN: Ferritin: 288 ng/mL (ref 11–307)

## 2024-12-05 LAB — MAGNESIUM: Magnesium: 1.9 mg/dL (ref 1.7–2.4)

## 2024-12-05 MED ORDER — MAGNESIUM SULFATE 2 GM/50ML IV SOLN
2.0000 g | Freq: Once | INTRAVENOUS | Status: AC
  Administered 2024-12-05: 2 g via INTRAVENOUS
  Filled 2024-12-05: qty 50

## 2024-12-05 MED ORDER — FUROSEMIDE 10 MG/ML IJ SOLN
80.0000 mg | Freq: Two times a day (BID) | INTRAMUSCULAR | Status: AC
  Administered 2024-12-05 (×2): 80 mg via INTRAVENOUS
  Filled 2024-12-05 (×2): qty 8

## 2024-12-05 MED ORDER — ACETAMINOPHEN 325 MG PO TABS
650.0000 mg | ORAL_TABLET | Freq: Four times a day (QID) | ORAL | Status: DC | PRN
  Administered 2024-12-05: 650 mg via ORAL
  Filled 2024-12-05: qty 2

## 2024-12-05 MED ORDER — LATANOPROST 0.005 % OP SOLN
1.0000 [drp] | Freq: Every day | OPHTHALMIC | Status: DC
  Administered 2024-12-05 – 2024-12-06 (×2): 1 [drp] via OPHTHALMIC
  Filled 2024-12-05: qty 2.5

## 2024-12-05 MED ORDER — CARVEDILOL 25 MG PO TABS
25.0000 mg | ORAL_TABLET | Freq: Two times a day (BID) | ORAL | Status: DC
  Administered 2024-12-05 – 2024-12-06 (×2): 25 mg via ORAL
  Filled 2024-12-05 (×2): qty 1

## 2024-12-05 NOTE — Progress Notes (Cosign Needed Addendum)
 "  HD#1 SUBJECTIVE:  Patient Summary: Amy Howell is a 69 y.o. person living with a history of HFrEF (12/24/2023: EF 35-40%), hypertension, history of smoking, medication nonadherence, hyperlipidemia, severe protein calorie malnutrition, CAD s/p RCA stent (on Brilinta  and ASA) incidental lung nodule 2024, chronic anemia, CKD stage IIIa, glaucoma both eyes, primary malignant neoplasm right breast s/p right mastectomy (declined recommended chemotherapy 3), history of stroke, peripheral neuropathy who presented with bilateral lower extremity edema from [facility] and admitted for heart failure exacerbation.  Overnight Events: Admitted  Interim History: Biggest concern for her this morning is her leg cramping. Discussed that this can happen with diuresis. She is willing to try the magnesium . Her daughter is coming soon and bringing breakfast and tea. Discussed plan to continue diuresing her and explained why we're asking her to take her medications.   OBJECTIVE:  Vital Signs: Vitals:   12/04/24 2314 12/05/24 0316 12/05/24 0755 12/05/24 1359  BP: (!) 166/76  (!) 174/83 (!) 174/73  Pulse: (!) 104  96 (!) 103  Resp: 18 19 20 18   Temp: 97.8 F (36.6 C) 97.6 F (36.4 C) 97.8 F (36.6 C)   TempSrc: Oral Oral Oral   SpO2: 100% 100% 97% 96%  Weight:  39.2 kg    Height:       Supplemental O2: Room Air SpO2: 96 %  Filed Weights   12/04/24 2134 12/05/24 0316  Weight: 40 kg 39.2 kg     Intake/Output Summary (Last 24 hours) at 12/05/2024 1610 Last data filed at 12/05/2024 1500 Gross per 24 hour  Intake 840 ml  Output 1200 ml  Net -360 ml   Net IO Since Admission: -360 mL [12/05/24 1610]  Physical Exam: Physical Exam Vitals reviewed.  Constitutional:      General: She is not in acute distress.    Appearance: She is not toxic-appearing.     Comments: Cachectic appearing  HENT:     Nose: Nose normal.     Mouth/Throat:     Mouth: Mucous membranes are moist.     Pharynx: Oropharynx is  clear.  Eyes:     Extraocular Movements: Extraocular movements intact.  Cardiovascular:     Rate and Rhythm: Regular rhythm.  Pulmonary:     Effort: Pulmonary effort is normal.     Breath sounds: No wheezing or rales.  Abdominal:     General: Bowel sounds are normal.     Palpations: Abdomen is soft.  Musculoskeletal:     Right lower leg: Edema present.     Left lower leg: Edema present.     Comments: BL 2+ pitting edema to mid femurs SCDs on  Skin:    General: Skin is warm.  Neurological:     General: No focal deficit present.     Mental Status: She is alert and oriented to person, place, and time.     Patient Lines/Drains/Airways Status     Active Line/Drains/Airways     Name Placement date Placement time Site Days   Peripheral IV 12/04/24 20 G Left Antecubital 12/04/24  1436  Antecubital  1   Wound 12/04/24 Finger (Comment which one) Anterior;Right 12/04/24  --  Finger (Comment which one)  1            Pertinent labs and imaging:  Mg 1.9 Fe 66 TIBC 517 Ferritin 288    Latest Ref Rng & Units 12/05/2024    2:40 AM 12/04/2024    2:25 PM 07/09/2024   11:40 AM  CBC  WBC 4.0 - 10.5 K/uL 7.3  5.3  5.6   Hemoglobin 12.0 - 15.0 g/dL 9.0  9.6  89.0   Hematocrit 36.0 - 46.0 % 29.8  32.5  36.0   Platelets 150 - 400 K/uL 361  367  249        Latest Ref Rng & Units 12/05/2024    2:40 AM 12/04/2024    2:25 PM 09/14/2024   10:38 AM  CMP  Glucose 70 - 99 mg/dL 865  879  86   BUN 8 - 23 mg/dL 35  36  28   Creatinine 0.44 - 1.00 mg/dL 8.76  8.72  8.81   Sodium 135 - 145 mmol/L 138  138  139   Potassium 3.5 - 5.1 mmol/L 4.1  4.5  4.8   Chloride 98 - 111 mmol/L 101  103  103   CO2 22 - 32 mmol/L 27  25  21    Calcium  8.9 - 10.3 mg/dL 8.8  9.3  9.4     No results found.  ASSESSMENT/PLAN:  Assessment: Principal Problem:   CHF (congestive heart failure) (HCC)  Amy Howell is a 69 y.o. person living with a history of HFrEF (12/24/2023: EF 35-40%), hypertension, history of  smoking, medication nonadherence, hyperlipidemia, severe protein calorie malnutrition, CAD s/p RCA stent (on Brilinta  and ASA) incidental lung nodule 2024, chronic anemia, CKD stage IIIa, glaucoma both eyes, primary malignant neoplasm right breast s/p right mastectomy (declined recommended chemotherapy 3), history of stroke, peripheral neuropathy who presented with bilateral lower extremity edema from [facility] and admitted for heart failure exacerbation.  Plan: Heart failure exacerbation (LV EF 35-40% 12/2023) Chronic heart failure with reduced ejection fraction (HFrEF <40%) Medication non adherence Presented with lower extremity swelling and complaints of orthopnea, pro BNP 9629.  Admitting weight 34.1 kg. Last hospitalization for congestive heart failure was 12/23/2023. Current medication management includes Coreg  12.5 mg a.m. and 6.25 mg nightly as well as torsemide  10 mg Monday Wednesday and Fridays.  There have been extensive conversations in the past regarding GDMT, however she believes that those medications make her condition worse.  Continues to be volume overloaded this morning, pitting edema up to bilateral mid femurs.  About 1 L out overnight.  Lungs are clear to auscultation.  I suspect she has been retaining fluid for several months now in the setting of medication nonadherence.  Increase carvedilol  to 25 mg twice daily.  Magnesium  1.9, replenished this a.m.  Discussed her heart condition and the importance of her medications.  Will continue IV furosemide  with goal of net UOP 2 to 3 L. - Lasix  IV 80 mg twice daily -Acetaminophen  650 mg every 6 hours as needed for mild pain - Increase carvedilol  to 25 mg twice daily - Replete electrolytes as needed (K >4, Mg >2) - Strict ins and outs - Daily weights - SCDs   Hypertension Tachycardia CAD s/p RCA stent (11/2022) Has remained hypertensive and tachycardic throughout admission.  Recommend increasing carvedilol  to 25 mg twice daily. Per  careteam, patient did take her morning carvedilol  although she took from her pill bottle from home. Patient amendable to taking her night time carvedilol  25 mg.  - Continue aspirin  81 mg daily - Continue ticagrelor  90 mg twice daily - Increase carvedilol  to 25 mg twice daily   Elevated creatinine History of CKD 3 a Patient has history of CKD 3A, GFR 45.  Creatinine 1.23 this a.m, electrolytes stable. - BMP in a.m.   Severe  protein calorie malnutrition Patient is chronically ill appearing and cachectic.  - Consult to registered dietitian ordered - PT/OT ordered - Ensures ordered - Continue follow-up outpatient   Microcytic anemia On admission, Hemoglobin 9.6, HCT 32.5, MCV 75.6, RDW 18.6.  On 12/21/2022: Iron 35, TIBC 325, percent saturation 11, ferritin 30.  Folate 33.1.  B12 10/01/2024 544.  Repeat iron this admission 66, TIBC 517, ferritin 288.  Given the new microcytosis with reticulocytosis and low TIBC with a relatively high ferritin, it is likely the patient has had iron deficiency in the past with a anemia of chronic disease component.  History of breast cancer status postmastectomy in 2023, patient declined recommended chemotherapy.   Peripheral neuropathy  At office visit 10/01/2024 she believes this is due to the carvedilol .  No lesions or abnormalities were noted on bilateral feet on physical exam.  B12 and TSH WNL.  Outpatient workup suspects PAD due to ABIs indicating moderate bilateral lower extremity arterial disease with abnormal toe-brachial indices.  Referral to vascular surgery was placed.  Patient no-showed her appointment 11/10/2024. - Consider repeat referral outpatient  Best Practice: Diet: Cardiac diet VTE: rivaroxaban  (XARELTO ) tablet 10 mg Start: 12/04/24 2130 Code: Full  Disposition planning: Therapy Recs: Pending, patient declining therapy Family Contact: Daughter,  DISPO: Anticipated discharge tomorrow to Home pending clinical  improvement.  Signature:  Viktoria Charmayne [facility]  4:10 PM, 12/05/2024  On Call pager [phone]  "

## 2024-12-05 NOTE — Progress Notes (Signed)
 PT Cancellation Note  Patient Details Name: Reda Citron MRN: [id number] DOB: 02/07/1956   Cancelled Treatment:    Reason Eval/Treat Not Completed: Other (comment)  Patient declines PT because it costs $200 out of my pocket and I can't do that. Explained role of PT and MD ordered PT and she continued to refuse. Will sign off.    Macario RAMAN, PT Acute Rehabilitation Services  Office [phone]    Macario SHAUNNA Soja 12/05/2024, 2:21 PM

## 2024-12-05 NOTE — Progress Notes (Incomplete)
 "  HD#1 SUBJECTIVE:  Patient Summary: Amy Howell is a 69 y.o. with a pertinent PMH of HFrEF (12/24/2023: EF 35-40%), hypertension, history of smoking, medication nonadherence, hyperlipidemia, severe protein calorie malnutrition, CAD s/p RCA stent (on Brilinta  and ASA) incidental lung nodule 2024, chronic anemia, CKD stage IIIa, glaucoma both eyes, primary malignant neoplasm right breast s/p right mastectomy (declined recommended chemotherapy 3), history of stroke, peripheral neuropathy, who presented with bilateral lower extremity edema, transfer from [facility] and admitted for heart failure exacerbation.  Overnight Events: none  Interim History: ***  OBJECTIVE:  Vital Signs: Vitals:   12/04/24 2134 12/04/24 2138 12/04/24 2314 12/05/24 0316  BP:   (!) 166/76   Pulse:   (!) 104   Resp:   18 19  Temp:  98 F (36.7 C) 97.8 F (36.6 C) 97.6 F (36.4 C)  TempSrc: Oral Oral Oral Oral  SpO2:   100% 100%  Weight: 40 kg   39.2 kg  Height: 5' (1.524 m)      Supplemental O2: {NAMES:3044014::Room Air,Nasal Cannula,Simple Face Mask,Partial Rebreather,HFNC,Non Rebreather,Venturi Mask,Bag Valve Mask} SpO2: 100 %  Filed Weights   12/04/24 2134 12/05/24 0316  Weight: 40 kg 39.2 kg    No intake or output data in the 24 hours ending 12/05/24 0609 Net IO Since Admission: No IO data has been entered for this period [12/05/24 0609]  Physical Exam: Physical Exam  Patient Lines/Drains/Airways Status     Active Line/Drains/Airways     Name Placement date Placement time Site Days   Peripheral IV 12/04/24 20 G Left Antecubital 12/04/24  1436  Antecubital  1            Pertinent labs and imaging:     Latest Ref Rng & Units 12/05/2024    2:40 AM 12/04/2024    2:25 PM 07/09/2024   11:40 AM  CBC  WBC 4.0 - 10.5 K/uL 7.3  5.3  5.6   Hemoglobin 12.0 - 15.0 g/dL 9.0  9.6  89.0   Hematocrit 36.0 - 46.0 % 29.8  32.5  36.0   Platelets 150 - 400 K/uL 361  367  249         Latest Ref Rng & Units 12/05/2024    2:40 AM 12/04/2024    2:25 PM 09/14/2024   10:38 AM  CMP  Glucose 70 - 99 mg/dL 865  879  86   BUN 8 - 23 mg/dL 35  36  28   Creatinine 0.44 - 1.00 mg/dL 8.76  8.72  8.81   Sodium 135 - 145 mmol/L 138  138  139   Potassium 3.5 - 5.1 mmol/L 4.1  4.5  4.8   Chloride 98 - 111 mmol/L 101  103  103   CO2 22 - 32 mmol/L 27  25  21    Calcium  8.9 - 10.3 mg/dL 8.8  9.3  9.4     DG Chest Portable 1 View Result Date: 12/04/2024 CLINICAL DATA:  Shortness of breath EXAM: PORTABLE CHEST 1 VIEW COMPARISON:  12/23/2023, CT 10/01/2024 FINDINGS: Cardiomegaly with aortic atherosclerosis. Emphysema. No focal opacity, pleural effusion, or pneumothorax. Linear atelectasis in the left mid lung. IMPRESSION: 1. Cardiomegaly without overt failure 2. Emphysema without focal airspace disease. Electronically Signed   By: Luke Bun M.D.   On: 12/04/2024 16:09    ASSESSMENT/PLAN:  Assessment: Principal Problem:   CHF (congestive heart failure) (HCC)  Amy Howell is a 69 y.o. person living with a history of HFrEF (12/24/2023: EF 35-40%),  hypertension, history of smoking, medication nonadherence, hyperlipidemia, severe protein calorie malnutrition, CAD s/p RCA stent (on Brilinta  and ASA) incidental lung nodule 2024, chronic anemia, CKD stage IIIa, glaucoma both eyes, primary malignant neoplasm right breast s/p right mastectomy (declined recommended chemotherapy 3), history of stroke, peripheral neuropathy, who presented with bilateral lower extremity edema, transfer from [facility] and admitted for heart failure exacerbation on hospital day 1.  Plan: # Heart failure exacerbation # Chronic heart failure with reduced ejection fraction (HFrEF <40%) # Medication non adherence Transferred from [facility].  Presents with lower extremity swelling and complaints of orthopnea. Last hospitalization for congestive heart failure was 12/23/2023. She has had long discussion with primary care  providers regarding disease process and reasoning for fluid buildup.  Current medication management includes Coreg  12.5 mg a.m. and 6.25 mg nightly as well as torsemide  10 mg Monday Wednesday and Fridays.  There have been extensive conversations in the past regarding GDMT, however she believes that those medications make her condition worse (very adamant against Jardiance  due to lethargy).  Initial labs include pro BNP 9629.  She has been nonadherent to her torsemide  (has not been filled in 6 months). Hypervolemic on physical exam with symmetrical lower extremity edema (lungs clear).  Unknown dry weight (she is the same documented weight as clinic visit 10/01/2024 at 34.1 kg). No signs of infection, no complaints of chest pain and well score 1.5, patient is a smoker but no history of COPD/inhaler use.  Patient is chronically ill and cachectic appearing, but from a heart failure exacerbation standpoint, she said that she has improved with the Lasix  that she received at [facility].  She is eager to go home before her birthday on Monday.  Will continue to diurese and monitor electrolytes with goal to decrease lower extremity swelling/symptoms.  With patient's history of CAD and hypertension with medication nonadherence, suspicion for worsening heart failure/EF. - Lasix  IV 80 mg - Replete electrolytes as needed (K >4, Mg >2) - Strict ins and outs - Daily weights - Repeat complete echo   # Tachycardia Heart rate between 90-102.  Takes carvedilol  12.5 mg in the morning and 6.75 mg in the evening. - Carvedilol  due to be administered at 2200 - Continue to monitor   # Hypertension # CAD s/p RCA stent (11/2022) Patient has been seen in the internal medicine teaching service clinic several times the past couple months, blood pressures have ranged from normotensive to hypertensive.  Admission blood pressures ranging from systolic 150-177.  Certainly history of CAD and uncontrolled hypertension can be worsening  heart failure as stated above. - Continue aspirin  81 mg daily - Continue Brilinta  90 mg twice daily - Continue carvedilol  12.5 mg a.m. and 6.75 mg evening   # Elevated creatinine # History of CKD 3 a Patient has history of CKD 3 AA, GFR 45.  Creatinine appears to be elevated at 1.27 which is above suspected baseline of 1.06.  Last BMP 09/14/2024 1.18.  With aggressive diuresis, continue to monitor. - Continue to monitor BMP   # Elevated troponin Troponin 35.  No complaints of chest pain.  EKG in sinus rhythm with biatrial enlargement. - If complaining of chest pain, repeat troponin and EKG   # Severe protein calorie malnutrition Patient is chronically ill appearing and cachectic.  Current weight 34.1 kg.  Appears stable from 09/2024.  Previously on mirtazapine .   - Consult to registered dietitian ordered - PT/OT ordered - Ensures ordered - Continue follow-up outpatient   # Chronic microcytic  anemia Hemoglobin 9.6, HCT 32.5, MCV 75.6, RDW 18.6.  Iron anemia panel 12/21/2022: Iron 35, TIBC 325, percent saturation 11, ferritin 30.  Folate 33.1.  B12 10/01/2024 544.  Patient is not taking iron supplementation. Referral sent at clinic appointment 09/14/2024 for [facility] to set up colonoscopy  - Consider iron supplementation outpatient   # Peripheral neuropathy Patient stated that she has been having constant pain described as electricity in her ankles.  At office visit 10/01/2024 she believes this is due to the carvedilol .  No lesions or abnormalities were noted on bilateral feet on physical exam.  B12 and TSH WNL.  Outpatient workup suspects PAD due to ABIs indicating moderate bilateral lower extremity arterial disease with abnormal toe-brachial indices.  Referral to vascular surgery was placed.  Patient no-showed her appointment 11/10/2024. - Consider repeat referral outpatient   #Chronic medical conditions Tobacco Use Disorder  - Nicotine  patch ordered PRN Hyperlipidemia - Last lipid  panel 09/16/2023: Cholesterol 141, HDL 32, TG 90, VLDL 18, LDL 91.  LDL goal post CVA would be <70. - Consider conversation around dietary changes to reduce cholesterol. Glaucoma both eyes - home eye drops ordered Primary malignant neoplasm right breast s/p right mastectomy (declined recommended chemotherapy 3) History of stroke (Right MCA stroke 2024) vs Aneurysm  - occurred while on Brilinta  and ASA Incidental lung nodule 2024 -CT chest 10/01/2024: Positive for new nodular airspace disease in right middle lobe, resolution of 5 mm subpleural nodular consolidation.  Pulmonary emphysema present.  Aortic atherosclerosis.  Best Practice: Diet: Cardiac diet IVF: Fluids: none, Rate: None VTE: rivaroxaban  (XARELTO ) tablet 10 mg Start: 12/04/24 2130 Code: Full  Disposition planning: Therapy Recs: None, DME: none Family Contact: ***, {Family:[id number]::to be notified.} DISPO: Anticipated discharge tomorrow to Home pending medication optimization.  Signature: Alfornia Idelle [facility]  6:09 AM, 12/05/2024  On Call pager [phone]  "

## 2024-12-05 NOTE — Plan of Care (Signed)

## 2024-12-06 ENCOUNTER — Telehealth (HOSPITAL_COMMUNITY): Payer: Self-pay

## 2024-12-06 ENCOUNTER — Other Ambulatory Visit (HOSPITAL_COMMUNITY): Payer: Self-pay

## 2024-12-06 DIAGNOSIS — E43 Unspecified severe protein-calorie malnutrition: Secondary | ICD-10-CM

## 2024-12-06 DIAGNOSIS — N1831 Chronic kidney disease, stage 3a: Secondary | ICD-10-CM | POA: Diagnosis not present

## 2024-12-06 DIAGNOSIS — I130 Hypertensive heart and chronic kidney disease with heart failure and stage 1 through stage 4 chronic kidney disease, or unspecified chronic kidney disease: Secondary | ICD-10-CM | POA: Diagnosis not present

## 2024-12-06 DIAGNOSIS — I2510 Atherosclerotic heart disease of native coronary artery without angina pectoris: Secondary | ICD-10-CM | POA: Diagnosis not present

## 2024-12-06 DIAGNOSIS — Z79899 Other long term (current) drug therapy: Secondary | ICD-10-CM

## 2024-12-06 DIAGNOSIS — Z7982 Long term (current) use of aspirin: Secondary | ICD-10-CM | POA: Diagnosis not present

## 2024-12-06 DIAGNOSIS — I5022 Chronic systolic (congestive) heart failure: Secondary | ICD-10-CM

## 2024-12-06 LAB — BASIC METABOLIC PANEL WITH GFR
Anion gap: 11 (ref 5–15)
BUN: 41 mg/dL — ABNORMAL HIGH (ref 8–23)
CO2: 28 mmol/L (ref 22–32)
Calcium: 9.2 mg/dL (ref 8.9–10.3)
Chloride: 96 mmol/L — ABNORMAL LOW (ref 98–111)
Creatinine, Ser: 1.11 mg/dL — ABNORMAL HIGH (ref 0.44–1.00)
GFR, Estimated: 53 mL/min — ABNORMAL LOW
Glucose, Bld: 98 mg/dL (ref 70–99)
Potassium: 4.5 mmol/L (ref 3.5–5.1)
Sodium: 134 mmol/L — ABNORMAL LOW (ref 135–145)

## 2024-12-06 LAB — CBC
HCT: 37.9 % (ref 36.0–46.0)
Hemoglobin: 11.2 g/dL — ABNORMAL LOW (ref 12.0–15.0)
MCH: 22.1 pg — ABNORMAL LOW (ref 26.0–34.0)
MCHC: 29.6 g/dL — ABNORMAL LOW (ref 30.0–36.0)
MCV: 74.9 fL — ABNORMAL LOW (ref 80.0–100.0)
Platelets: 364 K/uL (ref 150–400)
RBC: 5.06 MIL/uL (ref 3.87–5.11)
RDW: 19.5 % — ABNORMAL HIGH (ref 11.5–15.5)
WBC: 8.7 K/uL (ref 4.0–10.5)
nRBC: 0.3 % — ABNORMAL HIGH (ref 0.0–0.2)

## 2024-12-06 LAB — MAGNESIUM: Magnesium: 2.3 mg/dL (ref 1.7–2.4)

## 2024-12-06 MED ORDER — FUROSEMIDE 10 MG/ML IJ SOLN
80.0000 mg | Freq: Two times a day (BID) | INTRAMUSCULAR | Status: AC
  Administered 2024-12-06 – 2024-12-07 (×2): 80 mg via INTRAVENOUS
  Filled 2024-12-06 (×2): qty 8

## 2024-12-06 MED ORDER — ENSURE ENLIVE PO LIQD
237.0000 mL | Freq: Two times a day (BID) | ORAL | Status: DC
  Administered 2024-12-06 – 2024-12-07 (×3): 237 mL via ORAL
  Filled 2024-12-06 (×4): qty 237

## 2024-12-06 NOTE — Progress Notes (Signed)
 Initial Nutrition Assessment  DOCUMENTATION CODES:  Underweight  INTERVENTION:  Liberalize diet to Regular to encourage optimal nutritional intake Schedule Ensure Plus High Protein po BID, each supplement provides 350 kcal and 20 grams of protein. Magic cup TID with meals, each supplement provides 290 kcal and 9 grams of protein MVI with minerals daily  NUTRITION DIAGNOSIS:  Inadequate oral intake related to acute illness as evidenced by meal completion < 50%.  GOAL:  Patient will meet greater than or equal to 90% of their needs  MONITOR:  PO intake, Supplement acceptance, Diet advancement, Labs, Weight trends, I & O's  REASON FOR ASSESSMENT:  Consult Assessment of nutrition requirement/status  ASSESSMENT:   Pt presented with c/o BLE edema and admitted with heart failure exacerbation. PMH significant for HFrEF (EF 35-40%), HTN, tobacco use, medication non-compliance, HLD, severe protein calorie malnutrition, CAD s/p RCA stent, incidental lung nodule (2024), chronic anemia, CKD stage IIIa, glaucoma, primary malignant neoplasm right breast s/p mastectomy, stroke, peripheral neuropathy.  RD attempted to check in with patient at bedside to obtain nutrition related history however pt using the restroom.  Per medical team, plan to remain admitted at least one more day for continued diuresis as pt does not have transportation for clinical follow- up this week.   Meal documentation in the flowsheet reflect very limited/insufficient nutritional intake. Ensure supplements ordered PRN. Will adjust order of these supplements for nursing to offer on a more scheduled/around the clock basis.   Meal completions:  1/4: 25% breakfast, 50% lunch, 0% dinner 1/5: 25% breakfast  Review of weight history reflects a loss of 20.2% (12/31/23-09/14/24) which is clinically significant for time frame. Since 09/14/24, pt's weight is noted to have increased 2.9 kg likely r/t fluid retention.   Admit weight  (1/3): 40 kg First measured weight (1/4): 39.2 kg Current weight: 36.5 kg  UOP: x24 hours + 2 unmeasured occurrences   Medications: lasix  80mg  BID, MVI  Labs:  Sodium 134 Chloride 96 BUN 41 Cr 1.11 GFR 53   NUTRITION - FOCUSED PHYSICAL EXAM: Deferred to follow up.   Diet Order:   Diet Order             Diet regular Room service appropriate? Yes with Assist; Fluid consistency: Thin; Fluid restriction: 2000 mL Fluid  Diet effective now                   EDUCATION NEEDS:   No education needs have been identified at this time  Skin:  Skin Assessment: Reviewed RN Assessment  Last BM:  1/3  Height:   Ht Readings from Last 1 Encounters:  12/04/24 5' (1.524 m)    Weight:   Wt Readings from Last 1 Encounters:  12/06/24 36.5 kg   BMI:  Body mass index is 15.72 kg/m.  Estimated Nutritional Needs:   Kcal:  1200-1400  Protein:  60-75g  Fluid:  >/=1.5L  Royce Maris, RDN, LDN Clinical Nutrition See AMiON for contact information.

## 2024-12-06 NOTE — Progress Notes (Signed)
 "  Heart Failure Stewardship Pharmacist Progress Note   PCP: Marylu Gee, DO PCP-Cardiologist: Alvan Ronal BRAVO, MD (Inactive)    HPI:  69 yo female with a PMH of HFrEF (EF 35-40% on 12/24/23), HTN, hx of tobacco abuse, HLD, CAD s/p stent to RCA (2023), chronic anemia, CKD stage IIIa, glaucoma of both the L and R eye, hx breast cancer s/p R mastectomy, hx of stroke (2024) presented to [facility] as a transfer from [facility] ED on 12/04/24 with a chief complaint of bilateral lower extremity swelling and inability to lie flat at night due to SOB over the past couple of weeks. Initial proBNP was 9629 and troponin was 35. CXR on 12/04/24 showed cardiomegaly without overt failure and emphysema without focal airspace disease.  Patient stated to physician that her legs look much better now than they did she was at [facility]. She also stated that she has a hard time trusting doctors because they are prescribing medication that make her feel poorly. A note from 10/02/23 mentioned that the patient was previously discharged with spironolactone  25 mg, Coreg  6.25 mg, and Farxiga 10 mg, but she was only taking her Coreg  6.25 mg in the morning, and half a pill at night and stated that she does not like taking pills and some of them make her stomach upset and she is not sure which she just stopped. She also reported on 12/05/24 that she was adamant against Jardiance  as she experienced lethargy in the past.   Patient denies SOB, dizziness/lightheadedness, and chest pain today. No lower extremity swelling observed on exam.Patient is currently being diuresed with IV furosemide  80 mg BID. Discussed opportunities for medication optimization for heart failure. Patient stated she has tried several medications in the past, stating I try medications for 3 days and if they do not agree with my body I stop them. Discussed ways to avoid fluid accumulation in the outpatient setting, such as switching the torsemide  to bumetanide, a  more potent diuretic. She did not want to switch diuretics, as she believes her diuretic causes forgetfulness. At this time, the patient is not willing to add any new medications to optimize GDMT.  Current HF Medications: Diuretic: IV furosemide  80 mg BID Beta Blocker: carvedilol  25 mg BID  Prior to admission HF Medications: Diuretic: torsemide  10 mg 3 times weekly (MWF) Beta blocker: carvedilol  12.5 mg in the morning and 6.25 mg at night   Pertinent Lab Values: Serum creatinine 1.11, BUN 41, Potassium 4.5, Sodium 134, proBNP 9,629, Magnesium  2.3, A1c 5.5%  Vital Signs: Weight: 80 lbs (admission weight: 86 lbs) Blood pressure: 130s-180s/70-80s  Heart rate: 80s-100  I/O: net -1.5 L yesterday; net -1.6 L since admission  Medication Assistance / Insurance Benefits Check: Does the patient have prescription insurance?  Yes Type of insurance plan: [employer] [facility] Advantage)  Outpatient Pharmacy:  Prior to admission outpatient pharmacy: [facility] Is the patient willing to use [facility] pharmacy at discharge? Yes Is the patient willing to transition their outpatient pharmacy to utilize a [facility] outpatient pharmacy?   Yes    Assessment: 1. Acute on chronic systolic CHF (LVEF 35-40%) NYHA class II symptoms. - On carvedilol  25 mg BID and IV furosemide  80 mg BID (goal UOP 2-3 L) - Has been hesitant towards optimizing GDMT and had discontinued many therapies due to side effects   Plan: 1) Medication changes recommended at this time: - None at this time, will sign off  2) Patient assistance: -  None anticipated as there are no new medication changes at this time  3)  Education  - Attempted education but patient is unwilling to add or modify therapy at this time  B. Maegan Tyrika Newman, PharmD PGY-1 Pharmacy Resident [facility] 12/06/2024 9:13 AM    "

## 2024-12-06 NOTE — Progress Notes (Signed)
 "  HD#2 SUBJECTIVE:  Patient Summary: Amy Howell is a 69 y.o. person living with a history of HFrEF (12/24/2023: EF 35-40%), hypertension, history of smoking, medication nonadherence, hyperlipidemia, severe protein calorie malnutrition, CAD s/p RCA stent (on Brilinta  and ASA) incidental lung nodule 2024, chronic anemia, CKD stage IIIa, glaucoma both eyes, primary malignant neoplasm right breast s/p right mastectomy (declined recommended chemotherapy 3), history of stroke, peripheral neuropathy who presented with bilateral lower extremity edema from [facility] ED and admitted for heart failure exacerbation.   Overnight Events: None  Interim History: Patient's only complaint today is her legs are stinging and she wants to go home to take a shower.  We discussed how she still has some fluid in her legs and we recommend that she stay for another day of IV diuresis, patient would prefer to go home but she does not have transportation for a clinic follow-up appointment this week so is willing to stay for another day of diuresis.  Patient expresses mistrust with the medical system and her providers.   OBJECTIVE:  Vital Signs: Vitals:   12/05/24 1657 12/05/24 1924 12/05/24 2329 12/06/24 0428  BP: (!) 186/77 (!) 174/55  (!) 152/73  Pulse: 94 93  93  Resp: 20 18 19 19   Temp: 98.5 F (36.9 C) (!) 97.3 F (36.3 C) (!) 97.5 F (36.4 C) (!) 97.5 F (36.4 C)  TempSrc: Axillary Axillary Oral Oral  SpO2: 92% 99%  99%  Weight:    36.5 kg  Height:       Supplemental O2: Room Air SpO2: 99 %  Filed Weights   12/04/24 2134 12/05/24 0316 12/06/24 0428  Weight: 40 kg 39.2 kg 36.5 kg     Intake/Output Summary (Last 24 hours) at 12/06/2024 0700 Last data filed at 12/05/2024 2330 Gross per 24 hour  Intake 1380 ml  Output 2900 ml  Net -1520 ml   Net IO Since Admission: -1,520 mL [12/06/24 0700]  Physical Exam: Physical Exam Vitals reviewed.  Constitutional:      Appearance: She is cachectic.   HENT:     Nose: Nose normal.     Mouth/Throat:     Pharynx: Oropharynx is clear.  Eyes:     Conjunctiva/sclera: Conjunctivae normal.  Pulmonary:     Effort: Pulmonary effort is normal.  Musculoskeletal:     Right lower leg: Edema present.     Left lower leg: Edema present.     Comments: SCDs on Bilateral pitting edema has much improved although still has 1+ in the lower extremities  Skin:    General: Skin is warm.  Neurological:     General: No focal deficit present.     Mental Status: She is alert and oriented to person, place, and time.     Patient Lines/Drains/Airways Status     Active Line/Drains/Airways     Name Placement date Placement time Site Days   Peripheral IV 12/04/24 20 G Left Antecubital 12/04/24  1436  Antecubital  2   Wound 12/04/24 Finger (Comment which one) Anterior;Right 12/04/24  --  Finger (Comment which one)  2            Pertinent labs and imaging:  Mg 2.3    Latest Ref Rng & Units 12/06/2024    3:32 AM 12/05/2024    2:40 AM 12/04/2024    2:25 PM  CBC  WBC 4.0 - 10.5 K/uL 8.7  7.3  5.3   Hemoglobin 12.0 - 15.0 g/dL 88.7  9.0  9.6  Hematocrit 36.0 - 46.0 % 37.9  29.8  32.5   Platelets 150 - 400 K/uL 364  361  367        Latest Ref Rng & Units 12/06/2024    3:32 AM 12/05/2024    2:40 AM 12/04/2024    2:25 PM  CMP  Glucose 70 - 99 mg/dL 98  865  879   BUN 8 - 23 mg/dL 41  35  36   Creatinine 0.44 - 1.00 mg/dL 8.88  8.76  8.72   Sodium 135 - 145 mmol/L 134  138  138   Potassium 3.5 - 5.1 mmol/L 4.5  4.1  4.5   Chloride 98 - 111 mmol/L 96  101  103   CO2 22 - 32 mmol/L 28  27  25    Calcium  8.9 - 10.3 mg/dL 9.2  8.8  9.3     No results found.  ASSESSMENT/PLAN:  Assessment: Principal Problem:   CHF (congestive heart failure) (HCC)  Amy Howell is a 69 y.o. person living with a history of HFrEF (12/24/2023: EF 35-40%), hypertension, history of smoking, medication nonadherence, hyperlipidemia, severe protein calorie malnutrition, CAD s/p  RCA stent (on Brilinta  and ASA) incidental lung nodule 2024, chronic anemia, CKD stage IIIa, glaucoma both eyes, primary malignant neoplasm right breast s/p right mastectomy (declined recommended chemotherapy 3), history of stroke, peripheral neuropathy who presented with bilateral lower extremity edema from [facility] ED and admitted for heart failure exacerbation.   Plan: Heart failure exacerbation (LV EF 35-40% 12/2023) Chronic heart failure with reduced ejection fraction (HFrEF <40%) Medication non adherence Presented with lower extremity swelling and complaints of orthopnea, pro BNP 9629.  Admitting weight 34.1 kg. Last hospitalization for congestive heart failure was 12/23/2023. Current medication management includes Coreg  12.5 mg a.m. and 6.25 mg nightly as well as torsemide  10 mg Monday Wednesday and Fridays.  There have been extensive conversations in the past regarding GDMT, however she continues to believe those medications make her condition worse.  She had about 3 L of urinary output yesterday and is down about 8 pounds since admission.  Bilateral lower extremity edema has significantly improved although still has room for improvement.  Patient willing to stay for 1 more day of IV diuresis.  Electrolytes and creatinine stable. - Repeat Lasix  IV 80 mg twice daily -Acetaminophen  650 mg every 6 hours as needed for mild pain - Continue carvedilol  to 25 mg twice daily - Strict ins and outs - Daily weights - SCDs   Hypertension Tachycardia CAD s/p RCA stent (11/2022) Patient took carvedilol  25 mg last night and this morning.  Blood pressure 136/64 this morning.  Will discontinue ticagrelor  as patient does not need to be on dual therapy since has been a year since her stent. - Continue aspirin  81 mg daily - Discontinue ticagrelor  90 mg twice daily - Increase carvedilol  to 25 mg twice daily   Elevated creatinine History of CKD 3 a Patient has history of CKD 3A, GFR 45.  Creatinine 1.11  this a.m. and electrolytes are stable.  Patient prefers no more labs, will not repeat BMP or CBC in AM.   Severe protein calorie malnutrition Patient declined OT and PT. - Consult to registered dietitian ordered - Ensures ordered - Continue follow-up outpatient  Best Practice: Diet: Cardiac diet VTE: rivaroxaban  (XARELTO ) tablet 10 mg Start: 12/04/24 2130 Code: Full  Disposition planning: Therapy Recs: Patient declined Family Contact: Daughter,  DISPO: Anticipated discharge tomorrow to Home pending medical management and clinical  improvement.  Signature:  Viktoria Charmayne [facility]  7:00 AM, 12/06/2024  On Call pager [phone]  "

## 2024-12-06 NOTE — Plan of Care (Signed)

## 2024-12-06 NOTE — Progress Notes (Signed)
 OT Cancellation Note  Patient Details Name: Amy Howell MRN: [id number] DOB: 02/19/1956   Cancelled Treatment:    Reason Eval/Treat Not Completed: Patient declined, no reason specified Patient declining OT evaluation, citing independence, and stating its 200 dollars out of pocket for OT evaluation. OT signing off at this time.   Ronal Gift E. Aneesha Holloran, OTR/L Acute Rehabilitation Services [phone]   Ronal Gift Salt 12/06/2024, 10:37 AM

## 2024-12-06 NOTE — Progress Notes (Signed)
 Heart Failure Navigator Progress Note  Assessed for Heart & Vascular TOC clinic readiness.  Patient declined HF TOC appointment, said she doesn't want to do any Cardiac follow ups. Re-education was attempted..   Navigator available for reassessment of patient.   Stephane Haddock, BSN, Scientist, Clinical (histocompatibility And Immunogenetics) Only

## 2024-12-06 NOTE — Telephone Encounter (Signed)
 Pharmacy Patient Advocate Encounter  Insurance verification completed.    The patient is insured through [employer]. Patient has Medicare and is not eligible for a copay card, but may be able to apply for patient assistance or Medicare RX Payment Plan (Patient Must reach out to their plan, if eligible for payment plan), if available.    Ran test claim for Xarelto  20mg  tablet and the current 30 day co-pay is $216.93 due to deductible.   This test claim was processed through [facility]- copay amounts may vary at other pharmacies due to pharmacy/plan contracts, or as the patient moves through the different stages of their insurance plan.

## 2024-12-06 NOTE — TOC CM/SW Note (Signed)
 Transition of Care [facility]) - Inpatient Brief Assessment   Patient Details  Name: Amy Howell MRN: [id number] Date of Birth: [birth year]  Transition of Care [facility]) CM/SW Contact:    Luise JAYSON Pan, LCSWA Phone Number: 12/06/2024, 11:49 AM   Clinical Narrative: Patient from home. Patient has PCP and insurance on file. Patient has no prior hx of HH or SNF on file. Patient uses [facility] pharmac[facility] for medications. TOC will continue to monitor through daily treatment team meetings.    Transition of Care Asessment: Insurance and Status: Insurance coverage has been reviewed Patient has primary care physician: Yes Home environment has been reviewed: From home Prior level of function:: Independent Prior/Current Home Services: No current home services Social Drivers of Health Review: SDOH reviewed no interventions necessary Readmission risk has been reviewed: Yes Transition of care needs: no transition of care needs at this time

## 2024-12-06 NOTE — Progress Notes (Signed)
 Mobility Specialist Progress Note:    12/06/24 1330  Mobility  Activity Ambulated with assistance  Level of Assistance Standby assist, set-up cues, supervision of patient - no hands on  Assistive Device Other (Comment) (HHA)  Distance Ambulated (ft) 15 ft  Range of Motion/Exercises Active  Activity Response Tolerated fair  Mobility Referral Yes  Mobility visit 1 Mobility  Mobility Specialist Start Time (ACUTE ONLY) 1321  Mobility Specialist Stop Time (ACUTE ONLY) 1328  Mobility Specialist Time Calculation (min) (ACUTE ONLY) 7 min   Received pt sitting in up in bed agreeable to session. No c/o any symptoms. Pt moving slowly but able to move and ambulate slowly using HHA-CGA. Returned pt to bed w/ all needs met.   Venetia Keel Mobility Specialist Please Neurosurgeon or Rehab Office at [phone]

## 2024-12-07 DIAGNOSIS — C50911 Malignant neoplasm of unspecified site of right female breast: Secondary | ICD-10-CM | POA: Diagnosis not present

## 2024-12-07 DIAGNOSIS — I2510 Atherosclerotic heart disease of native coronary artery without angina pectoris: Secondary | ICD-10-CM | POA: Diagnosis not present

## 2024-12-07 DIAGNOSIS — I5023 Acute on chronic systolic (congestive) heart failure: Secondary | ICD-10-CM | POA: Diagnosis not present

## 2024-12-07 DIAGNOSIS — E43 Unspecified severe protein-calorie malnutrition: Secondary | ICD-10-CM | POA: Diagnosis not present

## 2024-12-07 DIAGNOSIS — E785 Hyperlipidemia, unspecified: Secondary | ICD-10-CM | POA: Diagnosis not present

## 2024-12-07 DIAGNOSIS — I130 Hypertensive heart and chronic kidney disease with heart failure and stage 1 through stage 4 chronic kidney disease, or unspecified chronic kidney disease: Secondary | ICD-10-CM | POA: Diagnosis not present

## 2024-12-07 DIAGNOSIS — F17200 Nicotine dependence, unspecified, uncomplicated: Secondary | ICD-10-CM | POA: Diagnosis not present

## 2024-12-07 DIAGNOSIS — Z681 Body mass index (BMI) 19 or less, adult: Secondary | ICD-10-CM

## 2024-12-07 DIAGNOSIS — N1831 Chronic kidney disease, stage 3a: Secondary | ICD-10-CM | POA: Diagnosis not present

## 2024-12-07 DIAGNOSIS — D509 Iron deficiency anemia, unspecified: Secondary | ICD-10-CM | POA: Diagnosis not present

## 2024-12-07 DIAGNOSIS — Z955 Presence of coronary angioplasty implant and graft: Secondary | ICD-10-CM | POA: Diagnosis not present

## 2024-12-07 DIAGNOSIS — G629 Polyneuropathy, unspecified: Secondary | ICD-10-CM | POA: Diagnosis not present

## 2024-12-07 MED ORDER — ADULT MULTIVITAMIN W/MINERALS CH: 1.0000 | ORAL_TABLET | Freq: Every day | ORAL | Status: AC

## 2024-12-07 MED ORDER — TICAGRELOR 90 MG PO TABS
90.0000 mg | ORAL_TABLET | Freq: Two times a day (BID) | ORAL | Status: DC
  Administered 2024-12-07: 90 mg via ORAL
  Filled 2024-12-07: qty 1

## 2024-12-07 NOTE — Progress Notes (Signed)
 Reviewed AVS, patient expressed understanding of medications, MD follow up reviewed.   See LDA for information on wounds at discharge. CCMD contacted and informed patients is being discharged.  Patient states all belongings brought to the hospital at time of admission are accounted for and packed to take home.  [facility] contacted to transport patient to entrance A, where family member was waiting in vehicle to transport home.

## 2024-12-07 NOTE — TOC Transition Note (Signed)
 Transition of Care [facility]) - Discharge Note   Patient Details  Name: Amy Howell MRN: [id number] Date of Birth: [birth year]  Transition of Care [facility]) CM/SW Contact:  Waddell Barnie Rama, RN Phone Number: 12/07/2024, 11:06 AM   Clinical Narrative:    For dc today, has no needs.          Patient Goals and CMS Choice            Discharge Placement                       Discharge Plan and Services Additional resources added to the After Visit Summary for                                       Social Drivers of Health (SDOH) Interventions SDOH Screenings   Food Insecurity: No Food Insecurity (12/05/2024)  Housing: Low Risk (12/05/2024)  Transportation Needs: No Transportation Needs (12/05/2024)  Utilities: Not At Risk (12/05/2024)  Alcohol Screen: Low Risk (12/06/2024)  Depression (PHQ2-9): Medium Risk (10/01/2024)  Financial Resource Strain: Low Risk (12/06/2024)  Physical Activity: Insufficiently Active (04/08/2023)  Social Connections: Moderately Isolated (12/05/2024)  Stress: No Stress Concern Present (04/08/2023)  Tobacco Use: Medium Risk (12/04/2024)     Readmission Risk Interventions     No data to display

## 2024-12-07 NOTE — Care Management Important Message (Signed)
 Important Message  Patient Details  Name: Iriel Nason MRN: [id number] Date of Birth: 1955/12/10   Important Message Given:  Yes - Medicare IM     Vonzell Arrie Sharps 12/07/2024, 12:40 PM

## 2024-12-07 NOTE — Discharge Instructions (Signed)
 Thank you for allowing us  to be part of your care. You were hospitalized for fluid overload causing leg swelling. We treated you with medications to help you get rid of extra fluid.   See the changes in your medications and management of your chronic conditions below:  *For your heart disease -We have only changed one of your home medicines. Please stop taking the baby Aspirin .   -Please continue taking your Ticagrelor  twice daily, Carvedilol  twice daily, and Torsemide  on Mondays, Wednesdays and Fridays.   -Please see your PCP in 7 to 10 days  FOLLOW UP APPOINTMENTS:  Please make sure to call your PCP office to schedule a hospital follow up appointment in the next 7-10 days.   Please call your PCP or our clinic if you have any questions or concerns, we may be able to help and keep you from a long and expensive emergency room wait. Our clinic and after hours phone number is [phone]. The best time to call is Monday through Friday 9 am to 4 pm but there is always someone available 24/7 if you have an emergency. If you need medication refills please notify your pharmacy one week in advance and they will send us  a request.   We are glad you are feeling better,  Viktoria King Internal Medicine Inpatient Teaching Service at [facility]

## 2024-12-07 NOTE — Discharge Summary (Signed)
 "  Name: Amy Howell MRN: [id number] DOB: [birth year] 69 y.o. PCP: Amy Gee, DO  Date of Admission: 12/04/2024  2:21 PM Date of Discharge: 12/07/2024 Attending Physician: Dr. Dayton Howell  Discharge Diagnosis: 1. Principal Problem:   Acute on chronic combined systolic and diastolic heart failure (HCC) Active Problems:   Medication nonadherence due to intolerance   Chronic anemia   CKD stage 3a, GFR 45-59 ml/min (HCC)   BMI less than 19,adult    Discharge Medications: Allergies as of 12/07/2024       Reactions   Bidil  [isosorb Dinitrate-hydralazine ] Other (See Comments)   Caused a stroke   Chlorhexidine  Dermatitis   Lisinopril  Other (See Comments)   Angioedema   Porcine (pork) Protein-containing Drug Products Hives   Rosuvastatin  Other (See Comments)   headache   Statins Other (See Comments)   Headaches and swelling        Medication List     STOP taking these medications    aspirin  81 MG chewable tablet   CORTIZONE-10 EX       TAKE these medications    Brilinta  90 MG Tabs tablet Generic drug: ticagrelor  Take 1 tablet (90 mg total) by mouth 2 (two) times daily.   brimonidine  0.2 % ophthalmic solution Commonly known as: ALPHAGAN  Place 1 drop into both eyes at bedtime as needed.   calamine lotion Apply 1 Application topically daily as needed for itching. Patient not taking   carvedilol  12.5 MG tablet Commonly known as: COREG  Take 1 tablet (12.5 mg total) by mouth 2 (two) times daily with a meal. What changed:  how much to take when to take this additional instructions   dorzolamide  2 % ophthalmic solution Commonly known as: TRUSOPT  Place 1 drop into both eyes 2 (two) times daily. What changed: when to take this   feeding supplement Liqd Take 237 mLs by mouth 3 (three) times daily between meals. What changed:  when to take this reasons to take this   latanoprost  0.005 % ophthalmic solution Commonly known as: XALATAN  Instill 1 drop into  both eyes at bedtime   multivitamin with minerals Tabs tablet Take 1 tablet by mouth daily.   timolol  0.5 % ophthalmic solution Commonly known as: TIMOPTIC  Place 1 drop into both eyes 2 (two) times daily.   torsemide  20 MG tablet Commonly known as: DEMADEX  Take 1 tablet (20 mg total) by mouth daily. What changed:  how much to take when to take this        Disposition and follow-up:   [name] was discharged from [facility] in Stable condition.  At the hospital follow up visit please address:  1.  Acute on chronic HFrEF (35-40% 12/2023): Discharged on home Carvedilol  BID and Torsemide  Monday, Wednesday and Friday. Consider adding spironolactone . Patient has history of refusing medications which has limited GDMT. Ensure patient is euvolemic and encourage medication adherence.   Stroke Hx: Discharged on home Ticagrelor  BID.   2.  Labs / imaging needed at time of follow-up: BMP to follow up Cr   3.  Pending labs/ test needing follow-up: none  Follow-up Appointments:  Follow-up Information     Amy Gee, DO. Call today.   Specialty: Internal Medicine Why: To make a hospital follow up Contact information: [address] [place] [address] [phone]                  Hospital Course by problem list: Amy Howell is a 69 y.o. 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 Last hospitalization for congestive heart failure was 12/23/2023. She has had many discussions with primary care providers and cardiology regarding disease process and reasoning for fluid buildup. There have been extensive conversations in the past regarding GDMT, however she believes that those medications make her condition worse (very adamant against Jardiance  due to lethargy). Current medication management includes Coreg  12.5 mg a.m. and 6.25 mg nightly as well as torsemide  10 mg Monday Wednesday and Fridays. Initial labs include pro BNP 9629.  She has been nonadherent to her torsemide  (has not been filled in 6 months). Hypervolemic on physical exam with symmetrical lower extremity edema (lungs clear). Unknown dry weight. No signs of infection, no complaints of chest pain and well score 1.5, patient is a smoker but no history of COPD/inhaler use. Suspect fluid has been accumulating over the last few months as she has not been taking her medications. Diuresed well throughout admission, UOP ~ 2L and down 8 lbs since admission (88>80 on day of discharge). Leg swelling markedly improved. BP systolics in the 180s, recommend increasing home carvedilol  to 25 mg BID although patient was inconsistent taking this during admission. Will discharge on Torsemide  10 mg MWF and home carvedilol  12.5 mg AM and 6.25 mg PM. Recommend increasing carvedilol  to 25 mg BID. Close follow up to ensure patient is taking these medications, she refuses further GDMT.   CAD s/p RCA stent (11/2022) Patient has expressed that she does not want to take any more medications and that medications have caused her illnesses. On admission, on Ticagrelor  90 mg BID and ASA 81 mg daily. Discussed with patient and daughter about discontinuing the ticagrelor  as her stent was over one year ago and she no longer needs DAPT. Patient and daughter  would prefer to continue the Ticagrelor  and discontinue the baby ASA. Will discharge on home Ticagrelor .    History of CKD3a Patient has history of CKD 3A, GFR 45.  Cr 1.27 on admission, 1.11 after 2 days of diuresis. Patient did not receive labs on day of discharge due to patient preference. Follow up Cr at follow up.   Severe protein calorie malnutrition Patient is chronically ill appearing and cachectic. Previously on mirtazapine .  Patient denied PT OT evals during admission.    Chronic microcytic anemia Referral sent at clinic appointment 09/14/2024 for [facility] to set up colonoscopy. On admission, Hemoglobin 9.6, HCT 32.5, MCV 75.6, RDW 18.6. On 12/21/2022: Iron 35, TIBC 325, percent saturation 11, ferritin 30.  Folate 33.1. B12 10/01/2024 544.  Repeat iron this admission 66, TIBC 517, ferritin 288.  Given the new microcytosis with reticulocytosis and low TIBC with a relatively high ferritin, it is likely the patient has had iron deficiency in the past with anemia of chronic disease component. Referral sent at clinic appointment 09/14/2024 for [facility] to set up colonoscopy.    Peripheral neuropathy Patient stated that she has been having constant pain described as  electricity in her ankles.  At office visit 10/01/2024 she believes this is due to the carvedilol .  B12 and TSH WNL.  Outpatient workup suspects PAD due to ABIs indicating moderate bilateral lower extremity arterial disease with abnormal toe-brachial indices.  Referral to vascular surgery was placed.  Patient no-showed her appointment 11/10/2024.   #Chronic medical conditions Tobacco Use Disorder  Hyperlipidemia - Last lipid panel 09/16/2023: Cholesterol 141, HDL 32, TG 90, VLDL 18, LDL 91.  LDL goal post CVA would be <70. Glaucoma both eyes - home eye drops  Primary malignant neoplasm right breast s/p right mastectomy (declined recommended chemotherapy) History of stroke (Right MCA stroke 2024) vs Aneurysm  - occurred while  on Brilinta  and ASA Incidental lung nodule 2024 -CT chest 10/01/2024: Positive for new nodular airspace disease in right middle lobe, resolution of 5 mm subpleural nodular consolidation.  Pulmonary emphysema present.  Aortic atherosclerosis.   Subjective Daughter on speaker phone for encounter. Daughter says patient does not take any medications except for her ticagrelor , ASA, carvedilol  and torsemide . She says her mom takes them how she wants to and often takes at different times or at different doses. Daughter asks that we not make changes to her moms medications. Explained why we recommended stopping Ticagrelor  although patient would rather continue Tica and stop ASA. Decision made to have patient stop ASA and continue the rest of her home medications on discharge and have close outpatient PCP follow up. All questions answered.   Discharge Exam:   BP 136/65 (BP Location: Left Arm)   Pulse 87   Temp (!) 97.3 F (36.3 C) (Oral)   Resp 18   Ht 5' (1.524 m)   Wt 36.4 kg   SpO2 99%   BMI 15.68 kg/m  Discharge exam:  Vitals reviewed.  Constitutional:      Appearance: Normal appearance. She is underweight.  HENT:     Nose: Nose normal.     Mouth/Throat:     Mouth: Mucous membranes are moist.     Pharynx: Oropharynx is clear.  Eyes:     Conjunctiva/sclera: Conjunctivae normal.  Pulmonary:     Effort: Pulmonary effort is normal.  Musculoskeletal:     Comments: BL LE pitting edema very mild, much improved   Skin:    General: Skin is warm.  Neurological:     General: No focal deficit present.     Mental Status: She is oriented to person, place, and time.   Pertinent Labs, Studies, and Procedures:     Latest Ref Rng & Units 12/06/2024    3:32 AM 12/05/2024    2:40 AM 12/04/2024    2:25 PM  CBC  WBC 4.0 - 10.5 K/uL 8.7  7.3  5.3   Hemoglobin 12.0 - 15.0 g/dL 88.7  9.0  9.6   Hematocrit 36.0 - 46.0 % 37.9  29.8  32.5   Platelets 150 - 400 K/uL 364  361  367        Latest Ref Rng &  Units 12/06/2024    3:32 AM 12/05/2024    2:40 AM 12/04/2024    2:25 PM  CMP  Glucose 70 - 99 mg/dL 98  865  879   BUN 8 - 23 mg/dL 41  35  36   Creatinine 0.44 - 1.00 mg/dL 8.88  8.76  8.72   Sodium 135 - 145 mmol/L 134  138  138   Potassium 3.5 - 5.1 mmol/L 4.5  4.1  4.5   Chloride  98 - 111 mmol/L 96  101  103   CO2 22 - 32 mmol/L 28  27  25    Calcium  8.9 - 10.3 mg/dL 9.2  8.8  9.3     DG Chest Portable 1 View Result Date: 12/04/2024 CLINICAL DATA:  Shortness of breath EXAM: PORTABLE CHEST 1 VIEW COMPARISON:  12/23/2023, CT 10/01/2024 FINDINGS: Cardiomegaly with aortic atherosclerosis. Emphysema. No focal opacity, pleural effusion, or pneumothorax. Linear atelectasis in the left mid lung. IMPRESSION: 1. Cardiomegaly without overt failure 2. Emphysema without focal airspace disease. Electronically Signed   By: Luke Bun M.D.   On: 12/04/2024 16:09     Discharge Instructions: Discharge Instructions     (HEART FAILURE PATIENTS) Call MD:  Anytime you have any of the following symptoms: 1) 3 pound weight gain in 24 hours or 5 pounds in 1 week 2) shortness of breath, with or without a dry hacking cough 3) swelling in the hands, feet or stomach 4) if you have to sleep on extra pillows at night in order to breathe.   Complete by: As directed    Diet - low sodium heart healthy   Complete by: As directed    Discharge instructions   Complete by: As directed    Thank you for allowing us  to be part of your care. You were hospitalized for fluid overload causing leg swelling. We treated you with medications to help you get rid of extra fluid.   See the changes in your medications and management of your chronic conditions below:  *For your heart disease -We have only changed one of your home medicines. Please stop taking the baby Aspirin .   -Please continue taking your Ticagrelor  twice daily, Carvedilol  twice daily, and Torsemide  on Mondays, Wednesdays and Fridays.   -Please see your PCP in 7 to  10 days  FOLLOW UP APPOINTMENTS:  Please make sure to call your PCP office to schedule a hospital follow up appointment in the next 7-10 days.   Please call your PCP or our clinic if you have any questions or concerns, we may be able to help and keep you from a long and expensive emergency room wait. Our clinic and after hours phone number is [phone]. The best time to call is Monday through Friday 9 am to 4 pm but there is always someone available 24/7 if you have an emergency. If you need medication refills please notify your pharmacy one week in advance and they will send us  a request.   We are glad you are feeling better,  Viktoria King Internal Medicine Inpatient Teaching Service at [facility]   Increase activity slowly   Complete by: As directed        Signed: King Viktoria, DO 12/07/2024, 10:56 AM     "

## 2024-12-07 NOTE — Plan of Care (Signed)

## 2024-12-09 NOTE — Telephone Encounter (Signed)
 Unable to r/s no show appts

## 2024-12-13 ENCOUNTER — Encounter: Payer: Self-pay | Admitting: Student

## 2024-12-13 ENCOUNTER — Ambulatory Visit: Payer: Self-pay | Admitting: Student

## 2024-12-13 ENCOUNTER — Other Ambulatory Visit: Payer: Self-pay

## 2024-12-13 VITALS — BP 146/74 | HR 89 | Temp 97.6°F | Ht 60.0 in | Wt 80.0 lb

## 2024-12-13 DIAGNOSIS — I5043 Acute on chronic combined systolic (congestive) and diastolic (congestive) heart failure: Secondary | ICD-10-CM | POA: Diagnosis not present

## 2024-12-13 DIAGNOSIS — Z8673 Personal history of transient ischemic attack (TIA), and cerebral infarction without residual deficits: Secondary | ICD-10-CM

## 2024-12-13 DIAGNOSIS — I110 Hypertensive heart disease with heart failure: Secondary | ICD-10-CM | POA: Diagnosis not present

## 2024-12-13 DIAGNOSIS — N1831 Chronic kidney disease, stage 3a: Secondary | ICD-10-CM

## 2024-12-13 DIAGNOSIS — I159 Secondary hypertension, unspecified: Secondary | ICD-10-CM

## 2024-12-13 NOTE — Assessment & Plan Note (Addendum)
 BP Readings from Last 3 Encounters:  12/13/24 (!) 146/74  12/07/24 136/65  11/03/24 138/84  Her blood pressure today was elevated at 146/74 on repeat measurement, consistent with prior visits showing above-goal readings. The patient is currently not amenable to initiating additional antihypertensive therapy but verbalizes understanding of the risks of uncontrolled hypertension. - Continue Carvedilol  12.5 mg twice daily - Follow up in 2 weeks

## 2024-12-13 NOTE — Assessment & Plan Note (Signed)
 Patient with a history of middle cerebral artery  stroke who is prescribed atorvastatin  and Brilinta . The patient reports she does not believe she needs atorvastatin  despite extensive counseling regarding the benefits of statin therapy for secondary stroke prevention. She is currently adherent to Brilinta .Medication nonadherence remains a significant concern. Antiplatelet therapy alone may be insufficient to reduce the risk of recurrent cerebrovascular events without appropriate lipid-lowering therapy. The patient verbalizes understanding of the risks of recurrent stroke after counseling but continues to refuse statin therapy at this time. Plan  - Continue Brilinta  90 mg twice daily. - Patient declined atorvastatin  despite adequate counseling - Revisit discussion regarding statin reinitiation at the next visit.

## 2024-12-13 NOTE — Assessment & Plan Note (Addendum)
 [name] presented today for hospital follow-up after a recent admission for acute on chronic congestive heart failure exacerbation. She was discharged on carvedilol   and torsemide , with a discharge weight of 80 lbs. She has a history of multiple hospitalizations for heart failure exacerbations largely due to medication nonadherence. She was accompanied by her daughter, who expressed concern that the patient frequently takes medications inconsistently and based on her own preferences rather than prescribed instructions. Per discharge instructions, she was prescribed carvedilol  twice daily and torsemide  on Mondays, Wednesdays, and Fridays; however, she has only been taking half a dose of carvedilol  once daily in the morning and has not been taking torsemide  as prescribed. She took carvedilol  this morning at 8 AM.Although she experiences lower-extremity edema during heart failure exacerbations, it is often subtle, making it difficult for her to self-assess the need for diuretic use. Her current weight is 80 lbs, consistent with her discharge weight.  She appears euvolemic exam. she declined laboratory testing today.  Approximately 10 minutes were spent counseling the patient on medication adherence, emphasizing her elevated cardiovascular risk and increased risk of mortality associated with nonadherence. Plan  - After extensive discussion, the patient agreed to resume carvedilol  12.5 mg twice daily. - Recommended continuation of torsemide  20 mg on Mondays, Wednesdays, and Fridays as prescribed at discharge but she  declined to take torsemide  as prescribed  - Return to clinic in 2 weeks for reassessment , heart failure symptoms, medication adherence, and to reconsider laboratory evaluation. - Repeat BMP at next visit

## 2024-12-13 NOTE — Patient Instructions (Addendum)
 Thank you, Amy Howell for allowing us  to provide your care today. Today we discussed your hospital visit. I am happy to see you feel better.   As discussed , you have agreed to take Coreg  12.5 mg in the morning and at night . I highly recommend you take all the medications as your Doctors prescribe given your over health. If you change your mind in the future, please let us  know.   I have ordered the following labs for you:  Lab Orders         Basic metabolic panel with GFR         Lipid Profile       Tests ordered today:    Referrals ordered today:   Referral Orders  No referral(s) requested today     I have ordered the following medication/changed the following medications:   Stop the following medications: There are no discontinued medications.   Start the following medications: No orders of the defined types were placed in this encounter.    Follow up: 2 weeks  for BP check, BMP and Lipid panel   Remember:   Should you have any questions or concerns please call the internal medicine clinic at [phone].   Drue Lisa Grow MD 12/13/2024, 11:12 AM   [facility]

## 2024-12-13 NOTE — Progress Notes (Signed)
 "  CC: HFU   HPI:  [name] is a 69 y.o. female living with a history stated below and presents today for hospital follow-up and also discuss her other chronic condition. Please see problem based assessment and plan for additional details.  Past Medical History:  Diagnosis Date   Aneurysm of middle cerebral artery    Cataracts, bilateral    Cerebral infarction due to carotid artery stenosis (HCC)    Diabetes mellitus type 2 in nonobese [facility])    Esophageal dysphagia, intermittent 12/01/2023   Glaucoma    History of smoking 03/30/2018   Hypertension    Hypertensive emergency 11/22/2022   Lung nodule, 5mm upper lobe, incidental 2024 on CTA 09/12/2023   Found on 09/2023 CTA, 5 mm upper lobe, no concerning characteristics, no further f/u imaging recommended by radiologist unless high risk, then one year imaging could be considered.     Prediabetes 01/08/2019   Lab Results      Component    Value    Date           HGBA1C    5.7 (H)    11/21/2022        Presence of stent in coronary artery in patient with coronary artery disease 07/11/2024   Stroke (HCC)    no residual, series of mini strokes   Tobacco abuse    Vertigo     Medications Ordered Prior to Encounter[1]  Family History  Problem Relation Age of Onset   Dementia Mother    Kidney disease Father    Hypertension Father    Diabetes Father    Heart disease Father    Lung cancer Maternal Aunt        dx after 3   Prostate cancer Other        MGM's brother   Breast cancer Neg Hx    Colon cancer Neg Hx    Liver disease Neg Hx    Esophageal cancer Neg Hx     Social History   Socioeconomic History   Marital status: Single    Spouse name: Not on file   Number of children: 2   Years of education: Not on file   Highest education level: 10th grade  Occupational History   Occupation: retired  Tobacco Use   Smoking status: Former    Current packs/day: 0.50    Average packs/day: 0.5 packs/day for 56.0 years  (28.0 ttl pk-yrs)    Types: Cigarettes   Smokeless tobacco: Never  Vaping Use   Vaping status: Never Used  Substance and Sexual Activity   Alcohol use: No   Drug use: No   Sexual activity: Not Currently    Birth control/protection: Surgical    Comment: Hyst  Other Topics Concern   Not on file  Social History Narrative   Not on file   Social Drivers of Health   Tobacco Use: Medium Risk (12/13/2024)   Patient History    Smoking Tobacco Use: Former    Smokeless Tobacco Use: Never    Passive Exposure: Not on Actuary Strain: Low Risk (12/06/2024)   Overall Financial Resource Strain (CARDIA)    Difficulty of Paying Living Expenses: Not very hard  Food Insecurity: No Food Insecurity (12/05/2024)   Epic    Worried About Radiation Protection Practitioner of Food in the Last Year: Never true    Ran Out of Food in the Last Year: Never true  Transportation Needs: No Transportation Needs (12/05/2024)   Epic  Lack of Transportation (Medical): No    Lack of Transportation (Non-Medical): No  Physical Activity: Insufficiently Active (04/08/2023)   Exercise Vital Sign    Days of Exercise per Week: 3 days    Minutes of Exercise per Session: 30 min  Stress: No Stress Concern Present (04/08/2023)   [employer] of Occupational Health - Occupational Stress Questionnaire    Feeling of Stress : Not at all  Social Connections: Moderately Isolated (12/05/2024)   Social Connection and Isolation Panel    Frequency of Communication with Friends and Family: More than three times a week    Frequency of Social Gatherings with Friends and Family: More than three times a week    Attends Religious Services: 1 to 4 times per year    Active Member of [employer] or Organizations: No    Attends Banker Meetings: Never    Marital Status: Never married  Intimate Partner Violence: Not At Risk (12/04/2024)   Epic    Fear of Current or Ex-Partner: No    Emotionally Abused: No    Physically Abused: No     Sexually Abused: No  Depression (PHQ2-9): Low Risk (12/13/2024)   Depression (PHQ2-9)    PHQ-2 Score: 0  Recent Concern: Depression (PHQ2-9) - Medium Risk (10/01/2024)   Depression (PHQ2-9)    PHQ-2 Score: 6  Alcohol Screen: Low Risk (12/06/2024)   Alcohol Screen    Last Alcohol Screening Score (AUDIT): 0  Housing: Low Risk (12/05/2024)   Epic    Unable to Pay for Housing in the Last Year: No    Number of Times Moved in the Last Year: 0    Homeless in the Last Year: No  Utilities: Not At Risk (12/05/2024)   Epic    Threatened with loss of utilities: No  Health Literacy: Not on file    Review of Systems: ROS negative except for what is noted on the assessment and plan.  Vitals:   12/13/24 1004 12/13/24 1115  BP: (!) 155/79 (!) 146/74  Pulse: 88 89  Temp: 97.6 F (36.4 C)   TempSrc: Oral   SpO2: 100%   Weight: 80 lb (36.3 kg)   Height: 5' (1.524 m)     Physical Exam:  Patient is a well-appearing woman, seated in a wheelchair, in no acute distress. No jaundice noted. Cardiovascular exam reveals regular rate and rhythm; no S3 appreciated. Lungs are clear to auscultation bilaterally. Skin is warm and dry. No lower extremity edema is present.She is oriented to time , place and person. Her mood is normal   Assessment & Plan:   Acute on chronic combined systolic and diastolic heart failure (HCC) Amy Howell presented today for hospital follow-up after a recent admission for acute on chronic congestive heart failure exacerbation. She was discharged on carvedilol   and torsemide , with a discharge weight of 80 lbs. She has a history of multiple hospitalizations for heart failure exacerbations largely due to medication nonadherence. She was accompanied by her daughter, who expressed concern that the patient frequently takes medications inconsistently and based on her own preferences rather than prescribed instructions. Per discharge instructions, she was prescribed carvedilol  twice daily and  torsemide  on Mondays, Wednesdays, and Fridays; however, she has only been taking half a dose of carvedilol  once daily in the morning and has not been taking torsemide  as prescribed. She took carvedilol  this morning at 8 AM.Although she experiences lower-extremity edema during heart failure exacerbations, it is often subtle, making it difficult for her to  self-assess the need for diuretic use. Her current weight is 80 lbs, consistent with her discharge weight.  She appears euvolemic exam. she declined laboratory testing today.  Approximately 10 minutes were spent counseling the patient on medication adherence, emphasizing her elevated cardiovascular risk and increased risk of mortality associated with nonadherence. Plan  - After extensive discussion, the patient agreed to resume carvedilol  12.5 mg twice daily. - Recommended continuation of torsemide  20 mg on Mondays, Wednesdays, and Fridays as prescribed at discharge but she  declined to take torsemide  as prescribed  - Return to clinic in 2 weeks for reassessment , heart failure symptoms, medication adherence, and to reconsider laboratory evaluation. - Repeat BMP at next visit   Hypertension BP Readings from Last 3 Encounters:  12/13/24 (!) 146/74  12/07/24 136/65  11/03/24 138/84  Her blood pressure today was elevated at 146/74 on repeat measurement, consistent with prior visits showing above-goal readings. The patient is currently not amenable to initiating additional antihypertensive therapy but verbalizes understanding of the risks of uncontrolled hypertension. - Continue Carvedilol  12.5 mg twice daily - Follow up in 2 weeks   History of stroke Patient with a history of middle cerebral artery  stroke who is prescribed atorvastatin  and Brilinta . The patient reports she does not believe she needs atorvastatin  despite extensive counseling regarding the benefits of statin therapy for secondary stroke prevention. She is currently adherent to  Brilinta .Medication nonadherence remains a significant concern. Antiplatelet therapy alone may be insufficient to reduce the risk of recurrent cerebrovascular events without appropriate lipid-lowering therapy. The patient verbalizes understanding of the risks of recurrent stroke after counseling but continues to refuse statin therapy at this time. Plan  - Continue Brilinta  90 mg twice daily. - Patient declined atorvastatin  despite adequate counseling - Revisit discussion regarding statin reinitiation at the next visit.     Patient discussed with Dr. Lovie Drue Grow, M.D [facility] Phone: [phone] Date 12/13/2024 Time 5:39 PM      [1]  Current Outpatient Medications on File Prior to Visit  Medication Sig Dispense Refill   brimonidine  (ALPHAGAN ) 0.2 % ophthalmic solution Place 1 drop into both eyes at bedtime as needed.     calamine lotion Apply 1 Application topically daily as needed for itching. Patient not taking     carvedilol  (COREG ) 12.5 MG tablet Take 1 tablet (12.5 mg total) by mouth 2 (two) times daily with a meal. (Patient taking differently: Take 6.25-12.5 mg by mouth See admin instructions. 12.5mg  in the morning and 6.25mg  at night) 60 tablet 3   dorzolamide  (TRUSOPT ) 2 % ophthalmic solution Place 1 drop into both eyes 2 (two) times daily. (Patient taking differently: Place 1 drop into both eyes daily.) 10 mL 6   feeding supplement (ENSURE ENLIVE / ENSURE PLUS) LIQD Take 237 mLs by mouth 3 (three) times daily between meals. (Patient taking differently: Take 237 mLs by mouth daily as needed.) 237 mL 12   latanoprost  (XALATAN ) 0.005 % ophthalmic solution Instill 1 drop into both eyes at bedtime 22.5 mL 3   Multiple Vitamin (MULTIVITAMIN WITH MINERALS) TABS tablet Take 1 tablet by mouth daily.     ticagrelor  (BRILINTA ) 90 MG TABS tablet Take 1 tablet (90 mg total) by mouth 2 (two) times daily. 60 tablet 11   timolol  (TIMOPTIC ) 0.5 % ophthalmic  solution Place 1 drop into both eyes 2 (two) times daily. 10 mL 7   torsemide  (DEMADEX ) 20 MG tablet Take 1 tablet (20 mg total)  by mouth daily. (Patient taking differently: Take 10-20 mg by mouth 3 (three) times a week.) 30 tablet 2   No current facility-administered medications on file prior to visit.   "

## 2024-12-15 NOTE — Progress Notes (Signed)
 Internal Medicine Clinic Attending  Case discussed with the resident at the time of the visit.  We reviewed the resident's history and exam and pertinent patient test results.  I agree with the assessment, diagnosis, and plan of care documented in the resident's note.

## 2024-12-29 ENCOUNTER — Other Ambulatory Visit: Payer: Self-pay | Admitting: Student

## 2024-12-30 ENCOUNTER — Other Ambulatory Visit: Payer: Self-pay

## 2024-12-30 ENCOUNTER — Other Ambulatory Visit (HOSPITAL_COMMUNITY): Payer: Self-pay

## 2024-12-30 MED ORDER — TICAGRELOR 90 MG PO TABS
90.0000 mg | ORAL_TABLET | Freq: Two times a day (BID) | ORAL | 11 refills | Status: AC
  Filled 2024-12-30: qty 60, 30d supply, fill #0

## 2024-12-31 ENCOUNTER — Other Ambulatory Visit (HOSPITAL_COMMUNITY): Payer: Self-pay
